# Patient Record
Sex: Female | Born: 1935 | Race: White | Hispanic: No | Marital: Married | Smoking: Former smoker
Health system: Southern US, Community
[De-identification: ages and names within clinical notes are randomized; demographics above are authoritative.]

## PROBLEM LIST (undated history)

## (undated) DIAGNOSIS — H269 Unspecified cataract: Secondary | ICD-10-CM

## (undated) DIAGNOSIS — I495 Sick sinus syndrome: Secondary | ICD-10-CM

## (undated) DIAGNOSIS — K222 Esophageal obstruction: Secondary | ICD-10-CM

## (undated) DIAGNOSIS — G43109 Migraine with aura, not intractable, without status migrainosus: Secondary | ICD-10-CM

## (undated) DIAGNOSIS — I4819 Other persistent atrial fibrillation: Secondary | ICD-10-CM

## (undated) DIAGNOSIS — Z8679 Personal history of other diseases of the circulatory system: Secondary | ICD-10-CM

## (undated) DIAGNOSIS — K449 Diaphragmatic hernia without obstruction or gangrene: Secondary | ICD-10-CM

## (undated) DIAGNOSIS — M19041 Primary osteoarthritis, right hand: Secondary | ICD-10-CM

## (undated) DIAGNOSIS — G5790 Unspecified mononeuropathy of unspecified lower limb: Secondary | ICD-10-CM

## (undated) DIAGNOSIS — J90 Pleural effusion, not elsewhere classified: Secondary | ICD-10-CM

## (undated) DIAGNOSIS — I4892 Unspecified atrial flutter: Secondary | ICD-10-CM

## (undated) DIAGNOSIS — Z87440 Personal history of urinary (tract) infections: Secondary | ICD-10-CM

## (undated) DIAGNOSIS — G4730 Sleep apnea, unspecified: Secondary | ICD-10-CM

## (undated) DIAGNOSIS — T7840XA Allergy, unspecified, initial encounter: Secondary | ICD-10-CM

## (undated) DIAGNOSIS — I10 Essential (primary) hypertension: Secondary | ICD-10-CM

## (undated) DIAGNOSIS — K52831 Collagenous colitis: Secondary | ICD-10-CM

## (undated) DIAGNOSIS — R011 Cardiac murmur, unspecified: Secondary | ICD-10-CM

## (undated) DIAGNOSIS — S86019A Strain of unspecified Achilles tendon, initial encounter: Secondary | ICD-10-CM

## (undated) DIAGNOSIS — G4733 Obstructive sleep apnea (adult) (pediatric): Secondary | ICD-10-CM

## (undated) DIAGNOSIS — I071 Rheumatic tricuspid insufficiency: Secondary | ICD-10-CM

## (undated) DIAGNOSIS — G5600 Carpal tunnel syndrome, unspecified upper limb: Secondary | ICD-10-CM

## (undated) DIAGNOSIS — K635 Polyp of colon: Secondary | ICD-10-CM

## (undated) DIAGNOSIS — G250 Essential tremor: Secondary | ICD-10-CM

## (undated) DIAGNOSIS — K219 Gastro-esophageal reflux disease without esophagitis: Secondary | ICD-10-CM

## (undated) DIAGNOSIS — G629 Polyneuropathy, unspecified: Secondary | ICD-10-CM

## (undated) DIAGNOSIS — K047 Periapical abscess without sinus: Secondary | ICD-10-CM

## (undated) DIAGNOSIS — K5790 Diverticulosis of intestine, part unspecified, without perforation or abscess without bleeding: Secondary | ICD-10-CM

## (undated) DIAGNOSIS — Z9889 Other specified postprocedural states: Secondary | ICD-10-CM

## (undated) HISTORY — PX: CATARACT EXTRACTION: SUR2

## (undated) HISTORY — DX: Rheumatic tricuspid insufficiency: I07.1

## (undated) HISTORY — DX: Other persistent atrial fibrillation: I48.19

## (undated) HISTORY — DX: Collagenous colitis: K52.831

## (undated) HISTORY — DX: Polyneuropathy, unspecified: G62.9

## (undated) HISTORY — DX: Allergy, unspecified, initial encounter: T78.40XA

## (undated) HISTORY — DX: Sleep apnea, unspecified: G47.30

## (undated) HISTORY — DX: Unspecified atrial flutter: I48.92

## (undated) HISTORY — DX: Essential tremor: G25.0

## (undated) HISTORY — PX: INTRAOCULAR LENS IMPLANT, SECONDARY: SHX1842

## (undated) HISTORY — PX: EXCISION MORTON'S NEUROMA: SHX5013

## (undated) HISTORY — DX: Polyp of colon: K63.5

## (undated) HISTORY — DX: Unspecified cataract: H26.9

## (undated) HISTORY — DX: Diverticulosis of intestine, part unspecified, without perforation or abscess without bleeding: K57.90

## (undated) HISTORY — PX: OTHER SURGICAL HISTORY: SHX169

## (undated) HISTORY — PX: EYE SURGERY: SHX253

## (undated) HISTORY — DX: Carpal tunnel syndrome, unspecified upper limb: G56.00

## (undated) HISTORY — DX: Gastro-esophageal reflux disease without esophagitis: K21.9

## (undated) HISTORY — PX: CARDIAC VALVE REPLACEMENT: SHX585

## (undated) HISTORY — DX: Primary osteoarthritis, right hand: M19.041

## (undated) HISTORY — DX: Strain of unspecified achilles tendon, initial encounter: S86.019A

## (undated) HISTORY — DX: Unspecified mononeuropathy of unspecified lower limb: G57.90

## (undated) HISTORY — DX: Obstructive sleep apnea (adult) (pediatric): G47.33

## (undated) HISTORY — DX: Migraine with aura, not intractable, without status migrainosus: G43.109

## (undated) HISTORY — DX: Periapical abscess without sinus: K04.7

## (undated) HISTORY — DX: Essential (primary) hypertension: I10

## (undated) HISTORY — PX: TONSILLECTOMY: SUR1361

## (undated) HISTORY — DX: Diaphragmatic hernia without obstruction or gangrene: K44.9

## (undated) HISTORY — PX: MOUTH SURGERY: SHX715

## (undated) HISTORY — PX: CARDIAC CATHETERIZATION: SHX172

## (undated) HISTORY — PX: CARPAL TUNNEL RELEASE: SHX101

## (undated) HISTORY — DX: Esophageal obstruction: K22.2

## (undated) HISTORY — PX: APPENDECTOMY: SHX54

## (undated) HISTORY — DX: Personal history of urinary (tract) infections: Z87.440

## (undated) HISTORY — DX: Pleural effusion, not elsewhere classified: J90

## (undated) HISTORY — DX: Cardiac murmur, unspecified: R01.1

## (undated) HISTORY — PX: ROTATOR CUFF REPAIR: SHX139

---
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
## 1998-09-10 ENCOUNTER — Other Ambulatory Visit: Admission: RE | Admit: 1998-09-10 | Discharge: 1998-09-10 | Payer: Self-pay | Admitting: Vascular Surgery

## 1999-09-20 ENCOUNTER — Other Ambulatory Visit: Admission: RE | Admit: 1999-09-20 | Discharge: 1999-09-20 | Payer: Self-pay | Admitting: Obstetrics and Gynecology

## 2000-09-05 ENCOUNTER — Other Ambulatory Visit: Admission: RE | Admit: 2000-09-05 | Discharge: 2000-09-05 | Payer: Self-pay | Admitting: Obstetrics and Gynecology

## 2001-05-03 ENCOUNTER — Encounter: Payer: Self-pay | Admitting: Internal Medicine

## 2001-09-06 ENCOUNTER — Other Ambulatory Visit: Admission: RE | Admit: 2001-09-06 | Discharge: 2001-09-06 | Payer: Self-pay | Admitting: Obstetrics and Gynecology

## 2002-09-24 ENCOUNTER — Other Ambulatory Visit: Admission: RE | Admit: 2002-09-24 | Discharge: 2002-09-24 | Payer: Self-pay | Admitting: Obstetrics and Gynecology

## 2003-12-27 DIAGNOSIS — K52831 Collagenous colitis: Secondary | ICD-10-CM

## 2003-12-27 HISTORY — DX: Collagenous colitis: K52.831

## 2004-08-16 ENCOUNTER — Encounter: Payer: Self-pay | Admitting: Internal Medicine

## 2004-10-27 ENCOUNTER — Ambulatory Visit: Payer: Self-pay | Admitting: Internal Medicine

## 2004-11-15 ENCOUNTER — Ambulatory Visit: Payer: Self-pay | Admitting: Family Medicine

## 2005-07-29 ENCOUNTER — Ambulatory Visit: Payer: Self-pay | Admitting: Internal Medicine

## 2005-08-10 ENCOUNTER — Ambulatory Visit: Payer: Self-pay | Admitting: Internal Medicine

## 2005-09-07 ENCOUNTER — Ambulatory Visit: Payer: Self-pay | Admitting: Internal Medicine

## 2005-09-26 ENCOUNTER — Encounter: Payer: Self-pay | Admitting: Internal Medicine

## 2005-09-26 ENCOUNTER — Ambulatory Visit: Payer: Self-pay

## 2005-10-17 ENCOUNTER — Ambulatory Visit: Payer: Self-pay | Admitting: Internal Medicine

## 2006-02-28 ENCOUNTER — Ambulatory Visit: Payer: Self-pay | Admitting: Internal Medicine

## 2006-03-13 ENCOUNTER — Ambulatory Visit: Payer: Self-pay | Admitting: Internal Medicine

## 2006-03-24 ENCOUNTER — Ambulatory Visit: Payer: Self-pay | Admitting: Internal Medicine

## 2006-03-29 ENCOUNTER — Ambulatory Visit: Payer: Self-pay | Admitting: Internal Medicine

## 2006-05-05 ENCOUNTER — Ambulatory Visit: Payer: Self-pay | Admitting: Internal Medicine

## 2006-05-29 ENCOUNTER — Ambulatory Visit: Payer: Self-pay | Admitting: Internal Medicine

## 2006-06-22 ENCOUNTER — Ambulatory Visit: Payer: Self-pay | Admitting: Internal Medicine

## 2006-09-01 ENCOUNTER — Ambulatory Visit: Payer: Self-pay | Admitting: Internal Medicine

## 2006-10-24 ENCOUNTER — Ambulatory Visit: Payer: Self-pay | Admitting: Internal Medicine

## 2006-11-06 ENCOUNTER — Ambulatory Visit: Payer: Self-pay | Admitting: Internal Medicine

## 2006-12-23 ENCOUNTER — Ambulatory Visit: Payer: Self-pay | Admitting: Internal Medicine

## 2007-01-22 ENCOUNTER — Ambulatory Visit: Payer: Self-pay | Admitting: Internal Medicine

## 2007-05-25 ENCOUNTER — Encounter: Payer: Self-pay | Admitting: Internal Medicine

## 2007-07-10 ENCOUNTER — Ambulatory Visit: Payer: Self-pay | Admitting: Internal Medicine

## 2007-10-03 LAB — CONVERTED CEMR LAB: Pap Smear: NORMAL

## 2007-11-16 DIAGNOSIS — G250 Essential tremor: Secondary | ICD-10-CM | POA: Insufficient documentation

## 2007-11-16 DIAGNOSIS — I10 Essential (primary) hypertension: Secondary | ICD-10-CM | POA: Insufficient documentation

## 2007-11-20 ENCOUNTER — Ambulatory Visit: Payer: Self-pay | Admitting: Internal Medicine

## 2007-11-20 LAB — CONVERTED CEMR LAB
Albumin: 4.1 g/dL (ref 3.5–5.2)
Alkaline Phosphatase: 68 units/L (ref 39–117)
BUN: 6 mg/dL (ref 6–23)
Basophils Absolute: 0 10*3/uL (ref 0.0–0.1)
Bilirubin Urine: NEGATIVE
Blood in Urine, dipstick: NEGATIVE
Cholesterol: 201 mg/dL (ref 0–200)
Creatinine, Ser: 0.6 mg/dL (ref 0.4–1.2)
Direct LDL: 126.8 mg/dL
Eosinophils Absolute: 0.1 10*3/uL (ref 0.0–0.6)
GFR calc Af Amer: 127 mL/min
GFR calc non Af Amer: 105 mL/min
Glucose, Urine, Semiquant: NEGATIVE
HCT: 40.9 % (ref 36.0–46.0)
Hemoglobin: 14.3 g/dL (ref 12.0–15.0)
MCHC: 35 g/dL (ref 30.0–36.0)
MCV: 97.5 fL (ref 78.0–100.0)
Monocytes Absolute: 0.4 10*3/uL (ref 0.2–0.7)
Monocytes Relative: 9.5 % (ref 3.0–11.0)
Neutrophils Relative %: 61.5 % (ref 43.0–77.0)
Potassium: 4.6 meq/L (ref 3.5–5.1)
RDW: 11.6 % (ref 11.5–14.6)
Sodium: 135 meq/L (ref 135–145)
Specific Gravity, Urine: 1.015
TSH: 2.82 microintl units/mL (ref 0.35–5.50)
Total Bilirubin: 0.9 mg/dL (ref 0.3–1.2)
Total CHOL/HDL Ratio: 3.2
Triglycerides: 72 mg/dL (ref 0–149)
pH: 7

## 2007-12-04 ENCOUNTER — Ambulatory Visit: Payer: Self-pay | Admitting: Internal Medicine

## 2008-04-03 ENCOUNTER — Encounter: Payer: Self-pay | Admitting: Internal Medicine

## 2008-04-03 ENCOUNTER — Ambulatory Visit: Payer: Self-pay | Admitting: Internal Medicine

## 2008-04-03 DIAGNOSIS — L719 Rosacea, unspecified: Secondary | ICD-10-CM | POA: Insufficient documentation

## 2008-06-26 ENCOUNTER — Telehealth: Payer: Self-pay | Admitting: *Deleted

## 2008-06-26 ENCOUNTER — Telehealth: Payer: Self-pay | Admitting: Internal Medicine

## 2008-10-06 ENCOUNTER — Telehealth: Payer: Self-pay | Admitting: Internal Medicine

## 2008-12-04 ENCOUNTER — Ambulatory Visit: Payer: Self-pay | Admitting: Internal Medicine

## 2008-12-04 LAB — CONVERTED CEMR LAB
Albumin: 4.1 g/dL (ref 3.5–5.2)
BUN: 8 mg/dL (ref 6–23)
Bilirubin Urine: NEGATIVE
Blood in Urine, dipstick: NEGATIVE
Calcium: 9.4 mg/dL (ref 8.4–10.5)
Eosinophils Absolute: 0.1 10*3/uL (ref 0.0–0.7)
Eosinophils Relative: 2.4 % (ref 0.0–5.0)
GFR calc Af Amer: 127 mL/min
GFR calc non Af Amer: 105 mL/min
Glucose, Bld: 99 mg/dL (ref 70–99)
Glucose, Urine, Semiquant: NEGATIVE
HCT: 41.3 % (ref 36.0–46.0)
HDL: 58.6 mg/dL (ref >39.0)
Hemoglobin: 14.4 g/dL (ref 12.0–15.0)
Ketones, urine, test strip: NEGATIVE
MCV: 96.5 fL (ref 78.0–100.0)
Monocytes Absolute: 0.4 10*3/uL (ref 0.1–1.0)
Monocytes Relative: 10.7 % (ref 3.0–12.0)
Neutro Abs: 2.2 10*3/uL (ref 1.4–7.7)
Nitrite: NEGATIVE
RDW: 12 % (ref 11.5–14.6)
Sodium: 134 meq/L — ABNORMAL LOW (ref 135–145)
Specific Gravity, Urine: 1.02
Total CHOL/HDL Ratio: 3.3
Total Protein: 7 g/dL (ref 6.0–8.3)
Triglycerides: 57 mg/dL (ref 0–149)
pH: 6

## 2008-12-05 ENCOUNTER — Ambulatory Visit: Payer: Self-pay | Admitting: Internal Medicine

## 2008-12-26 DIAGNOSIS — K635 Polyp of colon: Secondary | ICD-10-CM

## 2008-12-26 HISTORY — DX: Polyp of colon: K63.5

## 2009-01-06 ENCOUNTER — Telehealth: Payer: Self-pay | Admitting: Internal Medicine

## 2009-03-06 ENCOUNTER — Ambulatory Visit: Payer: Self-pay | Admitting: Internal Medicine

## 2009-06-17 ENCOUNTER — Encounter (INDEPENDENT_AMBULATORY_CARE_PROVIDER_SITE_OTHER): Payer: Self-pay | Admitting: *Deleted

## 2009-06-19 ENCOUNTER — Ambulatory Visit: Payer: Self-pay | Admitting: Internal Medicine

## 2009-06-19 DIAGNOSIS — K222 Esophageal obstruction: Secondary | ICD-10-CM

## 2009-06-19 DIAGNOSIS — K219 Gastro-esophageal reflux disease without esophagitis: Secondary | ICD-10-CM | POA: Insufficient documentation

## 2009-07-03 ENCOUNTER — Telehealth: Payer: Self-pay | Admitting: Internal Medicine

## 2009-07-03 ENCOUNTER — Ambulatory Visit: Payer: Self-pay | Admitting: Internal Medicine

## 2009-07-03 DIAGNOSIS — K625 Hemorrhage of anus and rectum: Secondary | ICD-10-CM | POA: Insufficient documentation

## 2009-07-06 ENCOUNTER — Telehealth: Payer: Self-pay | Admitting: Nurse Practitioner

## 2009-07-06 ENCOUNTER — Encounter: Payer: Self-pay | Admitting: Nurse Practitioner

## 2009-07-13 ENCOUNTER — Telehealth: Payer: Self-pay | Admitting: Nurse Practitioner

## 2009-07-16 ENCOUNTER — Ambulatory Visit: Payer: Self-pay | Admitting: Internal Medicine

## 2009-07-24 ENCOUNTER — Ambulatory Visit: Payer: Self-pay | Admitting: Internal Medicine

## 2009-07-24 LAB — CONVERTED CEMR LAB
ALT: 19 units/L (ref 0–35)
AST: 22 units/L (ref 0–37)
Alkaline Phosphatase: 49 units/L (ref 39–117)
Bilirubin, Direct: 0.1 mg/dL (ref 0.0–0.3)
Cholesterol: 191 mg/dL (ref 0–200)
Total Protein: 6.8 g/dL (ref 6.0–8.3)

## 2009-08-06 ENCOUNTER — Ambulatory Visit: Payer: Self-pay | Admitting: Internal Medicine

## 2009-08-19 ENCOUNTER — Encounter: Payer: Self-pay | Admitting: Internal Medicine

## 2009-09-15 ENCOUNTER — Ambulatory Visit: Payer: Self-pay | Admitting: Internal Medicine

## 2009-09-24 LAB — CONVERTED CEMR LAB: Pap Smear: NORMAL

## 2009-09-29 ENCOUNTER — Ambulatory Visit: Payer: Self-pay | Admitting: Internal Medicine

## 2009-09-29 ENCOUNTER — Encounter: Payer: Self-pay | Admitting: Internal Medicine

## 2009-09-30 ENCOUNTER — Encounter: Payer: Self-pay | Admitting: Internal Medicine

## 2009-10-29 ENCOUNTER — Telehealth: Payer: Self-pay | Admitting: Internal Medicine

## 2009-11-02 ENCOUNTER — Ambulatory Visit: Payer: Self-pay | Admitting: Internal Medicine

## 2009-11-02 LAB — CONVERTED CEMR LAB

## 2009-12-04 ENCOUNTER — Telehealth: Payer: Self-pay | Admitting: Internal Medicine

## 2009-12-04 ENCOUNTER — Ambulatory Visit: Payer: Self-pay | Admitting: Internal Medicine

## 2009-12-04 ENCOUNTER — Ambulatory Visit: Payer: Self-pay | Admitting: Physician Assistant

## 2009-12-09 LAB — CONVERTED CEMR LAB
Basophils Relative: 0 % (ref 0.0–3.0)
Eosinophils Relative: 0.9 % (ref 0.0–5.0)
Lymphocytes Relative: 9.3 % — ABNORMAL LOW (ref 12.0–46.0)
Monocytes Relative: 4.3 % (ref 3.0–12.0)
Neutrophils Relative %: 85.5 % — ABNORMAL HIGH (ref 43.0–77.0)
Platelets: 214 10*3/uL (ref 150.0–400.0)
RBC: 4.29 M/uL (ref 3.87–5.11)
WBC: 11.3 10*3/uL — ABNORMAL HIGH (ref 4.5–10.5)

## 2009-12-11 ENCOUNTER — Encounter: Payer: Self-pay | Admitting: Physician Assistant

## 2010-01-07 ENCOUNTER — Ambulatory Visit: Payer: Self-pay | Admitting: Internal Medicine

## 2010-01-07 DIAGNOSIS — E785 Hyperlipidemia, unspecified: Secondary | ICD-10-CM | POA: Insufficient documentation

## 2010-01-07 LAB — CONVERTED CEMR LAB
Calcium: 9.3 mg/dL (ref 8.4–10.5)
Cholesterol: 206 mg/dL — ABNORMAL HIGH (ref 0–200)
Creatinine, Ser: 0.7 mg/dL (ref 0.4–1.2)
GFR calc non Af Amer: 87.31 mL/min (ref >60)
HDL: 69 mg/dL (ref >39.00)
Sodium: 126 meq/L — ABNORMAL LOW (ref 135–145)
TSH: 1.65 microintl units/mL (ref 0.35–5.50)

## 2010-02-08 ENCOUNTER — Ambulatory Visit: Payer: Self-pay | Admitting: Internal Medicine

## 2010-02-08 LAB — CONVERTED CEMR LAB
Albumin: 4.2 g/dL
BUN: 14 mg/dL
CO2: 31 meq/L
Calcium: 9.7 mg/dL
Chloride: 96 meq/L
Creatinine, Ser: 0.7 mg/dL
GFR calc non Af Amer: 87.29 mL/min
Glucose, Bld: 95 mg/dL
Phosphorus: 5.4 mg/dL — ABNORMAL HIGH
Potassium: 4 meq/L
Sodium, Ur: 49 meq/L
Sodium: 134 meq/L — ABNORMAL LOW

## 2010-03-12 ENCOUNTER — Ambulatory Visit: Payer: Self-pay | Admitting: Internal Medicine

## 2010-03-12 LAB — CONVERTED CEMR LAB
CO2: 32 meq/L (ref 19–32)
Calcium: 9.1 mg/dL (ref 8.4–10.5)
Creatinine, Ser: 0.8 mg/dL (ref 0.4–1.2)
GFR calc non Af Amer: 74.8 mL/min (ref >60)
Glucose, Bld: 76 mg/dL (ref 70–99)
Sodium: 132 meq/L — ABNORMAL LOW (ref 135–145)

## 2010-03-16 ENCOUNTER — Ambulatory Visit: Payer: Self-pay | Admitting: Internal Medicine

## 2010-03-17 ENCOUNTER — Ambulatory Visit: Payer: Self-pay | Admitting: Internal Medicine

## 2010-03-23 ENCOUNTER — Encounter: Payer: Self-pay | Admitting: Internal Medicine

## 2010-03-23 LAB — CONVERTED CEMR LAB: Normetanephrine, 24H Ur: 316 (ref 122–676)

## 2010-04-20 ENCOUNTER — Ambulatory Visit: Payer: Self-pay | Admitting: Internal Medicine

## 2010-07-14 ENCOUNTER — Ambulatory Visit: Payer: Self-pay | Admitting: Internal Medicine

## 2010-07-14 LAB — CONVERTED CEMR LAB
BUN: 13 mg/dL (ref 6–23)
Chloride: 87 meq/L — ABNORMAL LOW (ref 96–112)
Creatinine, Ser: 0.6 mg/dL (ref 0.4–1.2)
Glucose, Bld: 91 mg/dL (ref 70–99)
Potassium: 4 meq/L (ref 3.5–5.1)

## 2010-08-03 ENCOUNTER — Ambulatory Visit: Payer: Self-pay | Admitting: Internal Medicine

## 2010-08-20 ENCOUNTER — Ambulatory Visit: Payer: Self-pay | Admitting: Family Medicine

## 2010-08-25 ENCOUNTER — Ambulatory Visit: Payer: Self-pay | Admitting: Internal Medicine

## 2010-08-25 ENCOUNTER — Telehealth: Payer: Self-pay | Admitting: Internal Medicine

## 2010-08-26 ENCOUNTER — Encounter: Admission: RE | Admit: 2010-08-26 | Discharge: 2010-08-26 | Payer: Self-pay | Admitting: Internal Medicine

## 2010-08-27 ENCOUNTER — Encounter: Payer: Self-pay | Admitting: Internal Medicine

## 2010-09-14 ENCOUNTER — Ambulatory Visit: Payer: Self-pay | Admitting: Internal Medicine

## 2010-09-14 LAB — CONVERTED CEMR LAB
BUN: 15 mg/dL (ref 6–23)
CO2: 30 meq/L (ref 19–32)
Calcium: 9.8 mg/dL (ref 8.4–10.5)
Creatinine, Ser: 0.6 mg/dL (ref 0.4–1.2)
GFR calc non Af Amer: 98.41 mL/min (ref >60)
Glucose, Bld: 84 mg/dL (ref 70–99)

## 2010-09-21 ENCOUNTER — Telehealth: Payer: Self-pay | Admitting: Internal Medicine

## 2010-10-08 ENCOUNTER — Ambulatory Visit: Payer: Self-pay | Admitting: Internal Medicine

## 2010-11-22 ENCOUNTER — Ambulatory Visit: Payer: Self-pay | Admitting: Internal Medicine

## 2010-11-22 LAB — CONVERTED CEMR LAB
BUN: 11 mg/dL (ref 6–23)
Calcium: 9.5 mg/dL (ref 8.4–10.5)
Chloride: 89 meq/L — ABNORMAL LOW (ref 96–112)
Creatinine, Ser: 0.7 mg/dL (ref 0.4–1.2)
GFR calc non Af Amer: 90.06 mL/min (ref >60)

## 2010-12-01 ENCOUNTER — Encounter
Admission: RE | Admit: 2010-12-01 | Discharge: 2010-12-01 | Payer: Self-pay | Source: Home / Self Care | Admitting: Obstetrics and Gynecology

## 2010-12-06 ENCOUNTER — Encounter: Payer: Self-pay | Admitting: Internal Medicine

## 2010-12-08 ENCOUNTER — Encounter: Payer: Self-pay | Admitting: Internal Medicine

## 2010-12-24 LAB — CONVERTED CEMR LAB: Pap Smear: NORMAL

## 2011-01-17 ENCOUNTER — Ambulatory Visit
Admission: RE | Admit: 2011-01-17 | Discharge: 2011-01-17 | Payer: Self-pay | Source: Home / Self Care | Attending: Internal Medicine | Admitting: Internal Medicine

## 2011-01-17 ENCOUNTER — Encounter: Payer: Self-pay | Admitting: Internal Medicine

## 2011-01-17 ENCOUNTER — Other Ambulatory Visit: Payer: Self-pay | Admitting: Internal Medicine

## 2011-01-17 DIAGNOSIS — S86019A Strain of unspecified Achilles tendon, initial encounter: Secondary | ICD-10-CM | POA: Insufficient documentation

## 2011-01-17 LAB — CBC WITH DIFFERENTIAL/PLATELET
Basophils Absolute: 0 10*3/uL (ref 0.0–0.1)
Hemoglobin: 13.8 g/dL (ref 12.0–15.0)
Lymphocytes Relative: 20.3 % (ref 12.0–46.0)
Monocytes Relative: 10.1 % (ref 3.0–12.0)
Neutrophils Relative %: 67.5 % (ref 43.0–77.0)
Platelets: 177 10*3/uL (ref 150.0–400.0)
RDW: 12.8 % (ref 11.5–14.6)

## 2011-01-17 LAB — CONVERTED CEMR LAB
Bilirubin Urine: NEGATIVE
Cholesterol, target level: 200 mg/dL
Glucose, Urine, Semiquant: NEGATIVE
HDL goal, serum: 40 mg/dL
Ketones, urine, test strip: NEGATIVE
LDL Goal: 160 mg/dL
Specific Gravity, Urine: 1.01
Urobilinogen, UA: 0.2

## 2011-01-17 LAB — COMPREHENSIVE METABOLIC PANEL
AST: 17 U/L (ref 0–37)
Alkaline Phosphatase: 55 U/L (ref 39–117)
BUN: 12 mg/dL (ref 6–23)
Creatinine, Ser: 0.5 mg/dL (ref 0.4–1.2)
Glucose, Bld: 87 mg/dL (ref 70–99)

## 2011-01-17 LAB — LIPID PANEL
Cholesterol: 203 mg/dL — ABNORMAL HIGH (ref 0–200)
HDL: 64.9 mg/dL (ref >39.00)
Triglycerides: 71 mg/dL (ref 0.0–149.0)
VLDL: 14.2 mg/dL (ref 0.0–40.0)

## 2011-01-17 LAB — LDL CHOLESTEROL, DIRECT: Direct LDL: 128.8 mg/dL

## 2011-01-25 NOTE — Assessment & Plan Note (Signed)
Summary: 4 month rov/njr   Vital Signs:  Patient profile:   76 year old female Height:      66 inches Weight:      156 pounds BMI:     25.27 Temp:     98.1 degrees F oral Pulse rate:   68 / minute Resp:     14 per minute BP sitting:   136 / 80  (left arm)  Vitals Entered By: Willy Eddy, LPN (July 14, 2010 8:18 AM) CC: roa, Hypertension Management Is Patient Diabetic? No   Primary Care Provider:  Darryll Capers, MD   CC:  roa and Hypertension Management.  History of Present Illness: Has not taken the GERD has elevated head of bed and underwent successfull salivary dilation ( see note) did not want to take a PPI due to risk of ostoeporosis fell and had a vitrious detachment seeing the opthamologist  need follow up of the sodium ( had low) mild balance issues since had eye surgery  Hypertension History:      She denies headache, chest pain, palpitations, dyspnea with exertion, orthopnea, PND, peripheral edema, visual symptoms, neurologic problems, syncope, and side effects from treatment.        Positive major cardiovascular risk factors include female age 74 years old or older, hyperlipidemia, and hypertension.  Negative major cardiovascular risk factors include negative family history for ischemic heart disease and non-tobacco-user status.     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) 10)  Adverse Drug Reaction   (ICD-995.20) 11)  Family Hx Colon Cancer  (ICD-V16.0) 12)  Rectal Bleeding  (ICD-569.3) 13)  Diarrhea-presumed Infectious  (ICD-009.3) 14)  Gastroesophageal Reflux Disease  (ICD-530.81) 15)  Insect Bite, Upper Arm  (ICD-912.4) 16)  Biceps Tendinitis, Right  (ICD-726.12) 17)  Acne Rosacea  (ICD-695.3) 18)  Physical Examination  (ICD-V70.0) 19)  Collagenous Colitis  (ICD-710.9) 20)  Diverticulosis, Colon  (ICD-562.10) 21)  Retinal Detachment, 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) 22)  Morton's Neuroma, 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) 23)  Hypertension  (ICD-401.9) 24)  Tremor, Essential  (ICD-333.1)  Current Problems (verified): 1)  Dysphagia  (ICD-787.29) 2)  Fh of Colon Cancer  (ICD-153.9) 3)  Hyponatremia  (ICD-276.1) 4)  Hyperlipidemia, Mild  (ICD-272.4) 5)  Preventive Health Care  (ICD-V70.0) 6)  Colonic Polyps, Adenomatous, Hx of  (ICD-V12.72) 7)  Diarrhea  (ICD-787.91) 8)  Abdominal Pain -generalized  (ICD-789.07) 9)  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 One By Mouth Daily 2)  Turmeric Curcumin   Caps (Misc Natural Products) .... Once Daily 3)  Vitamin D-1000 Max St [phone] Mg-Unit  Tabs (Calcium-Vitamin D) .... Once Daily 4)  Tums 500 Mg  Chew (Calcium Carbonate Antacid) .... Three  Times A Day 5)  Restasis 0.05 % Emul (Cyclosporine) .... Use As Directed 6)  Fish Oil 1000 Mg Caps (Omega-3 Fatty Acids) .... One By Mouth Two Times A Day 7)  Omeprazole 20 Mg  Cpdr (Omeprazole) .Marland Kitchen.. 1 Each Day 30 Minutes Before Meal 8)  Omeprazole 20 Mg  Cpdr (Omeprazole) .Marland Kitchen.. 1 Each Day 30 Minutes Before Meal  Current Medications (verified): 1)  Nadolol-Bendroflumethiazide 40-5 Mg  Tabs (Nadolol-Bendroflumethiazide) .... One By Mouth Daily 2)  Vitamin D-1000 Max St [phone] Mg-Unit  Tabs (Calcium-Vitamin D) .... Once Daily 3)  Tums 500 Mg  Chew (Calcium Carbonate Antacid) .... Three Times A Day 4)  Restasis 0.05 % Emul (Cyclosporine) .... Once Daily 5)  Fish Oil 1000 Mg Caps (Omega-3 Fatty Acids) .... Pt States 10,000 Once Daily 6)  Ocuvite  Tabs (Multiple Vitamins-Minerals) .Marland Kitchen.. 1 Two Times A Day  Allergies (verified): 1)  ! Penicillin G Procaine (Penicillin G Procaine) 2)  ! Ery-Tab (Erythromycin Base) 3)  ! Adhesive Paper (Adhesive Tape) 4)  ! Biaxin 5)  ! Flagyl (Metronidazole)  Directives: 1)  Living Will   Past History:  Family History: Last updated: 2010-03-22 father died from rectal cancer and heart failure mother is 93 with dementia Family History of Celiac Disease: Daughter Family History of Heart Disease: Father Son has Barrett's esophagus  Social History: Last updated: March 22, 2010 Married Former Smoker Alcohol Use - yes-1 glass wine daily Daily Caffeine Use Illicit Drug Use - no  Risk Factors: Alcohol Use: <1 (07/14/2010) Caffeine Use: 1 (12/04/2007) Exercise: no (12/04/2007)  Risk Factors: Smoking Status: quit (07/14/2010) Packs/Day: 1-2 packs (07/14/2010) Passive Smoke Exposure: no (07/14/2010)  Past medical, surgical, family and social histories (including risk factors) reviewed, and no changes noted (except as noted below).  Past Medical History: Reviewed history from 12/04/2009 and no changes required. Hypertension Diverticulosis COLON  POLYPS 10/10,TUBULAR ADENOMA AND ONE HYPERPLASTIC Collagenous colitis 2005 Hypertension  Past Surgical History: Reviewed history from Mar 22, 2010 and no changes required. Appendectomy Tonsillectomy Cataract extraction Moles removed Surgerical Removal of Morton's Neuroma in Right Foot Mouth surgery (for exostosis)  Family History: Reviewed history from 03/22/10 and no changes required. father died from rectal cancer and heart failure mother is 29 with dementia Family History of Celiac Disease: Daughter Family History of Heart Disease: Father Son has Barrett's esophagus  Social History: Reviewed history from 03-22-10 and no changes required. Married Former Smoker Alcohol Use - yes-1 glass wine daily Daily Caffeine Use Illicit Drug Use - no  Review of Systems  The patient denies anorexia, fever, weight loss, weight gain, vision loss, decreased hearing, hoarseness, chest pain, syncope, dyspnea on exertion, peripheral edema, prolonged cough, headaches, hemoptysis, abdominal pain, melena, hematochezia, severe indigestion/heartburn, hematuria, incontinence, genital sores, muscle weakness, suspicious skin lesions, transient blindness, difficulty walking, depression, unusual weight change, abnormal bleeding, enlarged lymph nodes, angioedema, and breast masses.         no symptomatic heart burn  Physical Exam  General:  Well-developed,well-nourished,in no acute distress; alert,appropriate and cooperative  throughout examination Head:  Normocephalic and atraumatic without obvious abnormalities. No apparent alopecia or balding. Eyes:  pupils equal and pupils round.   Ears:  R ear normal and L ear normal.   Nose:  no external deformity and no nasal discharge.   Mouth:  pharynx pink and moist and no erythema.   Neck:  No deformities, masses, or tenderness noted. Lungs:  normal respiratory effort and no intercostal retractions.   Heart:  normal rate and regular rhythm.   Abdomen:   soft, non-tender, and normal bowel sounds.   Msk:  normal ROM, no joint tenderness, and no joint swelling.   Pulses:  R and L carotid,radial,femoral,dorsalis pedis and posterior tibial pulses are full and equal bilaterally Extremities:  No clubbing, cyanosis, edema, or deformity noted with normal full range of motion of all joints.   Neurologic:  alert & oriented X3 and gait normal.     Impression & Recommendations:  Problem # 1:  DYSPHAGIA (ZOX-096.04) did not want to take a PPI has made extensive lifestyle modifications  Problem # 2:  HYPERLIPIDEMIA, MILD (ICD-272.4)  Labs Reviewed: SGOT: 22 (07/24/2009)   SGPT: 19 (07/24/2009)  10 Yr Risk Heart Disease: 7 % Prior 10 Yr Risk Heart Disease: 4 % (03/17/2010)   HDL:69.00 (01/07/2010), 54.50 (07/24/2009)  LDL:115 (07/24/2009), 121 (12/04/2008)  Chol:206 (01/07/2010), 191 (07/24/2009)  Trig:106.0 (07/24/2009), 57 (12/04/2008)  Problem # 3:  HYPERTENSION (ICD-401.9) Assessment: Improved  Her updated medication list for this problem includes:    Nadolol-bendroflumethiazide 40-5 Mg Tabs (Nadolol-bendroflumethiazide) ..... One by mouth daily  BP today: 136/80 Prior BP: 110/74 (03/17/2010)  10 Yr Risk Heart Disease: 7 % Prior 10 Yr Risk Heart Disease: 4 % (03/17/2010)  Labs Reviewed: K+: 3.8 (03/12/2010) Creat: : 0.8 (03/12/2010)   Chol: 206 (01/07/2010)   HDL: 69.00 (01/07/2010)   LDL: 115 (07/24/2009)   TG: 106.0 (07/24/2009)  Problem # 4:  TREMOR, ESSENTIAL (ICD-333.1) Assessment: Unchanged tremor stable  Problem # 5:  HYPONATREMIA (ICD-276.1)  moniter labs  Orders: Venipuncture (54098) TLB-BMP (Basic Metabolic Panel-BMET) (80048-METABOL)  Complete Medication List: 1)  Nadolol-bendroflumethiazide 40-5 Mg Tabs (Nadolol-bendroflumethiazide) .... One by mouth daily 2)  Vitamin D-1000 Max St [id number] Mg-unit Tabs (Calcium-vitamin d) .... Once daily 3)  Tums 500 Mg Chew (Calcium carbonate antacid) .... Three times a day 4)   Restasis 0.05 % Emul (Cyclosporine) .... Once daily 5)  Fish Oil 1000 Mg Caps (Omega-3 fatty acids) .... Pt states 10,000 once daily 6)  Ocuvite Tabs (Multiple vitamins-minerals) .Marland Kitchen.. 1 two times a day  Hypertension Assessment/Plan:      The patient's hypertensive risk group is category B: At least one risk factor (excluding diabetes) with no target organ damage.  Her calculated 10 year risk of coronary heart disease is 7 %.  Today's blood pressure is 136/80.  Her blood pressure goal is < 140/90.  Patient Instructions: 1)  Jan   medicare prevention visit  30 min ( fasting)

## 2011-01-25 NOTE — Assessment & Plan Note (Signed)
Summary: 1 month rov/njr   Vital Signs:  Patient profile:   76 year old female Height:      66 inches Weight:      162 pounds BMI:     26.24 Temp:     98.2 degrees F oral Pulse rate:   68 / minute Resp:     14 per minute BP sitting:   110 / 74  (left arm)  Vitals Entered By: Willy Eddy, LPN (March 17, 2010 12:47 PM) CC: roa labs    Primary Care Provider:  Darryll Capers, MD   CC:  roa labs .  History of Present Illness: Has an endoscopy with Dr Juanda Chance The etiology of the diarrhea may be celiac dz and bz will be pursued   Hypertension History:      She denies headache, chest pain, palpitations, dyspnea with exertion, orthopnea, PND, peripheral edema, visual symptoms, neurologic problems, syncope, and side effects from treatment.        Positive major cardiovascular risk factors include female age 27 years old or older, hyperlipidemia, and hypertension.  Negative major cardiovascular risk factors include negative family history for ischemic heart disease and non-tobacco-user status.     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) 10)  Adverse Drug Reaction  (ICD-995.20) 11)  Family Hx Colon Cancer  (ICD-V16.0) 12)  Rectal Bleeding  (ICD-569.3) 13)  Diarrhea-presumed Infectious  (ICD-009.3) 14)  Gastroesophageal Reflux Disease  (ICD-530.81) 15)  Insect Bite, Upper Arm  (ICD-912.4) 16)  Biceps Tendinitis, Right   (ICD-726.12) 17)  Acne Rosacea  (ICD-695.3) 18)  Physical Examination  (ICD-V70.0) 19)  Collagenous Colitis  (ICD-710.9) 20)  Diverticulosis, Colon  (ICD-562.10) 21)  Retinal Detachment, 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) 22)  Morton's Neuroma, 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 One By Mouth Daily 2)  Turmeric Curcumin   Caps (Misc Natural Products) .... Once Daily 3)  Vitamin D-1000 Max St [phone] Mg-Unit  Tabs (Calcium-Vitamin D) .... Once Daily 4)  Tums 500 Mg  Chew (Calcium Carbonate Antacid) .... Three Times A Day 5)  Restasis 0.05 % Emul (Cyclosporine) .... Use As Directed 6)  Fish Oil 1000 Mg Caps (Omega-3 Fatty Acids) .... One By Mouth Two Times A Day  Current Medications (verified): 1)  Nadolol-Bendroflumethiazide 40-5 Mg  Tabs (Nadolol-Bendroflumethiazide) .... One By Mouth Daily 2)  Turmeric Curcumin   Caps (Misc Natural Products) .... Once Daily 3)  Vitamin D-1000 Max St [id number] Mg-Unit  Tabs (Calcium-Vitamin D) .... Once Daily 4)  Tums 500 Mg  Chew (Calcium Carbonate Antacid) .... Three Times A Day 5)  Restasis 0.05 % Emul (Cyclosporine) .... Use As Directed 6)  Fish Oil 1000 Mg Caps (Omega-3 Fatty Acids) .... One By Mouth Two Times A  Day  Allergies (verified): 1)  ! Penicillin G Procaine (Penicillin G Procaine) 2)  ! Ery-Tab (Erythromycin Base) 3)  ! Adhesive Paper (Adhesive Tape) 4)  ! Biaxin 5)  ! Flagyl (Metronidazole)  Directives: 1)  Living Will   Past History:  Family History: Last updated: 04-04-2010 father died from rectal cancer and heart failure mother is 45 with dementia Family History of Celiac Disease: Daughter Family History of Heart Disease: Father Son has Barrett's esophagus  Social History: Last updated: 04-04-2010 Married Former Smoker Alcohol Use - yes-1 glass wine daily Daily Caffeine  Use Illicit Drug Use - no  Risk Factors: Alcohol Use: <1 (03/17/2010) Caffeine Use: 1 (12/04/2007) Exercise: no (12/04/2007)  Risk Factors: Smoking Status: quit (03/17/2010) Packs/Day: 1-2 packs (03/17/2010) Passive Smoke Exposure: no (03/17/2010)  Past medical, surgical, family and social histories (including risk factors) reviewed, and no changes noted (except as noted below).  Past Medical History: Reviewed history from 12/04/2009 and no changes required. Hypertension Diverticulosis COLON POLYPS 10/10,TUBULAR ADENOMA AND ONE HYPERPLASTIC Collagenous colitis 2005 Hypertension  Past Surgical History: Reviewed history from 04-04-10 and no changes required. Appendectomy Tonsillectomy Cataract extraction Moles removed Surgerical Removal of Morton's Neuroma in Right Foot Mouth surgery (for exostosis)  Family History: Reviewed history from 04/04/10 and no changes required. father died from rectal cancer and heart failure mother is 41 with dementia Family History of Celiac Disease: Daughter Family History of Heart Disease: Father Son has Barrett's esophagus  Social History: Reviewed history from April 04, 2010 and no changes required. Married Former Smoker Alcohol Use - yes-1 glass wine daily Daily Caffeine Use Illicit Drug Use - no  Review of Systems  The patient denies anorexia, fever, weight loss, weight gain, vision loss, decreased hearing, hoarseness, chest pain, syncope, dyspnea on exertion, peripheral edema, prolonged cough, headaches, hemoptysis, abdominal pain, melena, hematochezia, severe indigestion/heartburn, hematuria, incontinence, genital sores, muscle weakness, suspicious skin lesions, transient blindness, difficulty walking, depression, unusual weight change, abnormal bleeding, enlarged lymph nodes, angioedema, and breast masses.    Physical Exam  General:  Well-developed,well-nourished,in no acute distress; alert,appropriate and cooperative  throughout examination Head:  Normocephalic and atraumatic without obvious abnormalities. No apparent alopecia or balding. Eyes:  pupils equal and pupils round.   Ears:  R ear normal and L ear normal.   Nose:  no external deformity and no nasal discharge.   Neck:  No deformities, masses, or tenderness noted. Lungs:  normal respiratory effort and no intercostal retractions.   Heart:  normal rate and regular rhythm.   Abdomen:  Bowel sounds positive,abdomen soft and non-tender without masses, organomegaly or hernias noted.   Impression & Recommendations:  Problem # 1:  HYPONATREMIA (ICD-276.1) Assessment Unchanged  the  sodium has been borderline but is stable given the diarrhea the differential still includes malabsorption and carcinoid? will do a 24 hour urine collection  Orders: T-Urine 24 Hr. 5 HIAA ([id number]) T-Urine 24 Hr. Catecholamines [id number]) T-Urine 24 Hr. Metanephrines [phone])  Problem # 2:  HYPERLIPIDEMIA, MILD (ICD-272.4) Assessment: Unchanged  Labs Reviewed: SGOT: 22 (07/24/2009)   SGPT: 19 (07/24/2009)  10 Yr Risk Heart Disease: 4 % Prior 10 Yr Risk Heart Disease: 9 % (11/02/2009)   HDL:69.00 (01/07/2010), 54.50 (07/24/2009)  LDL:115 (07/24/2009), 121 (12/04/2008)  Chol:206 (01/07/2010), 191 (07/24/2009)  Trig:106.0 (07/24/2009), 57 (12/04/2008)  Problem # 3:  DYSPHAGIA (ICD-787.29) EGD planned  Complete Medication List: 1)  Nadolol-bendroflumethiazide 40-5 Mg Tabs (Nadolol-bendroflumethiazide) .... One by mouth daily 2)  Turmeric Curcumin Caps (Misc natural products) .... Once daily  3)  Vitamin D-1000 Max St [phone] Mg-unit Tabs (Calcium-vitamin d) .... Once daily 4)  Tums 500 Mg Chew (Calcium carbonate antacid) .... Three times a day 5)  Restasis 0.05 % Emul (Cyclosporine) .... Use as directed 6)  Fish Oil 1000 Mg Caps (Omega-3 fatty acids) .... One by mouth two times a day  Hypertension Assessment/Plan:      The patient's hypertensive  risk group is category B: At least one risk factor (excluding diabetes) with no target organ damage.  Her calculated 10 year risk of coronary heart disease is 4 %.  Today's blood pressure is 110/74.  Her blood pressure goal is < 140/90.  Patient Instructions: 1)  stop the tumeric possible impact on sodium 2)  consider  cinamin for the arthritis 3)  Please schedule a follow-up appointment in 4 months.

## 2011-01-25 NOTE — Assessment & Plan Note (Signed)
Summary: heel pain/bmw   Vital Signs:  Patient profile:   76 year old female Height:      66 inches Weight:      156 pounds BMI:     25.27 Temp:     99.2 degrees F oral Pulse rate:   72 / minute Resp:     14 per minute BP sitting:   130 / 80  (left arm)  Vitals Entered By: Willy Eddy, LPN (August 25, 2010 4:14 PM) CC: saw dr Caryl Never friday for HEEL PAIN IN LEFT FOOT- COMPLETED Z PACK ,BUT STILL PAINFUL AND RED AND SWOLLEN Is Patient Diabetic? No   Primary Care Provider:  Darryll Capers, MD   CC:  saw dr Caryl Never friday for HEEL PAIN IN LEFT FOOT- COMPLETED Z PACK  and BUT STILL PAINFUL AND RED AND SWOLLEN.  History of Present Illness: june 9 was seen for alkle pain wrapping the site--- could not tolerate the pt was seen again in the office for possible tendonitis vs cellulitis the examiner though the tnedon was warm to touch she has no "entry point for cellulitis and has no increased risk factors this has continued for 2 months after an injury suggesting a tendon tear hx of ankle fracture in 1999  Preventive Screening-Counseling & Management  Alcohol-Tobacco     Alcohol drinks/day: <1     Smoking Status: quit     Packs/Day: 1-2 packs     Year Started: 1959     Year Quit: 1969     Passive Smoke Exposure: no  Problems Prior to Update: 1)  Heel Pain, Left  (ICD-729.5) 2)  Dysphagia  (ICD-787.29) 3)  Fh of Colon Cancer  (ICD-153.9) 4)  Hyponatremia  (ICD-276.1) 5)  Hyperlipidemia, Mild  (ICD-272.4) 6)  Preventive Health Care  (ICD-V70.0) 7)  Colonic Polyps, Adenomatous, Hx of  (ICD-V12.72) 8)  Diarrhea  (ICD-787.91) 9)  Abdominal Pain -generalized  (ICD-789.07) 10)  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) 11)  Adverse Drug Reaction  (ICD-995.20) 12)  Family Hx Colon Cancer  (ICD-V16.0) 13)  Rectal Bleeding  (ICD-569.3) 14)  Diarrhea-presumed Infectious  (ICD-009.3) 15)  Gastroesophageal Reflux Disease  (ICD-530.81) 16)  Insect Bite, Upper Arm   (ICD-912.4) 17)  Biceps Tendinitis, Right  (ICD-726.12) 18)  Acne Rosacea  (ICD-695.3) 19)  Physical Examination  (ICD-V70.0) 20)  Collagenous Colitis  (ICD-710.9) 21)  Diverticulosis, Colon  (ICD-562.10) 22)  Retinal Detachment, 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) 23)  Morton's Neuroma, 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) 24)  Hypertension  (ICD-401.9) 25)  Tremor, Essential  (ICD-333.1)  Current Problems (verified): 1)  Heel Pain, Left  (ICD-729.5) 2)  Dysphagia  (ICD-787.29) 3)  Fh of Colon Cancer  (ICD-153.9) 4)  Hyponatremia  (ICD-276.1) 5)  Hyperlipidemia, Mild  (ICD-272.4) 6)  Preventive Health Care  (ICD-V70.0) 7)  Colonic Polyps, Adenomatous, Hx of  (ICD-V12.72) 8)  Diarrhea  (ICD-787.91) 9)  Abdominal Pain -generalized  (ICD-789.07) 10)  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) 11)  Adverse Drug Reaction  (ICD-995.20) 12)  Family Hx Colon Cancer  (ICD-V16.0) 13)  Rectal Bleeding  (ICD-569.3) 14)  Diarrhea-presumed Infectious  (ICD-009.3) 15)  Gastroesophageal Reflux Disease  (ICD-530.81) 16)  Insect Bite, Upper Arm  (ICD-912.4) 17)  Biceps Tendinitis, Right  (ICD-726.12) 18)  Acne Rosacea  (ICD-695.3) 19)  Physical Examination  (ICD-V70.0) 20)  Collagenous Colitis  (ICD-710.9) 21)  Diverticulosis, Colon  (ICD-562.10) 22)  Retinal Detachment, 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) 23)  Morton's Neuroma, 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 One By Mouth Daily 2)  Vitamin D-1000 Max St [phone] Mg-Unit  Tabs (Calcium-Vitamin D) .... Once Daily 3)  Tums 500 Mg  Chew (Calcium Carbonate Antacid) .... Three Times A Day 4)  Fish Oil 1000 Mg Caps (Omega-3 Fatty Acids) .... Pt States 10,000 Once Daily 5)  Ocuvite  Tabs (Multiple Vitamins-Minerals) .... 2 Once Daily 6)  Azithromycin 250 Mg Tabs (Azithromycin) .... 2 By Mouth Today Then One By Mouth Once Daily  For 4 Days  Current Medications (verified): 1)  Nadolol-Bendroflumethiazide 40-5 Mg  Tabs (Nadolol-Bendroflumethiazide) .... One By Mouth Daily 2)  Vitamin D-1000 Max St [phone] Mg-Unit  Tabs (Calcium-Vitamin D) .... Once Daily 3)  Tums 500 Mg  Chew (Calcium Carbonate Antacid) .... Three Times A Day 4)  Fish Oil 1000 Mg Caps (Omega-3 Fatty Acids) .... Pt States 10,000 Once Daily 5)  Ocuvite  Tabs (Multiple Vitamins-Minerals) .... 2 Once Daily 6)  Azithromycin 250 Mg Tabs (Azithromycin) .... 2 By Mouth Today Then One By Mouth Once Daily For 4 Days  Allergies (verified): 1)  ! Penicillin G Procaine (Penicillin G Procaine) 2)  ! Ery-Tab (Erythromycin Base) 3)  ! Adhesive Paper (Adhesive Tape) 4)  ! Biaxin 5)  ! Flagyl (Metronidazole)  Directives: 1)  Living Will   Past History:  Family History: Last updated: March 15, 2010 father died from rectal cancer and heart failure mother is 80 with dementia Family History of Celiac Disease: Daughter Family History of Heart Disease: Father Son has Barrett's esophagus  Social History: Last updated: 2010/03/15 Married Former Smoker Alcohol Use - yes-1 glass wine daily Daily Caffeine Use Illicit Drug Use - no  Risk Factors: Alcohol Use: <1 (08/25/2010) Caffeine Use: 1 (12/04/2007) Exercise: no (12/04/2007)  Risk Factors: Smoking Status: quit (08/25/2010) Packs/Day: 1-2 packs (08/25/2010) Passive Smoke Exposure: no (08/25/2010)  Past medical, surgical, family and social histories (including risk factors) reviewed, and no changes noted (except as noted below).  Past Medical History: Reviewed history from 12/04/2009 and no changes required. Hypertension Diverticulosis COLON POLYPS 10/10,TUBULAR ADENOMA AND ONE HYPERPLASTIC Collagenous colitis 2005 Hypertension  Past Surgical History: Reviewed history from 03/15/10 and no changes required. Appendectomy Tonsillectomy Cataract extraction Moles removed Surgerical Removal of  Morton's Neuroma in Right Foot Mouth surgery (for exostosis)  Family History: Reviewed history from 03-15-10 and no changes required. father died from rectal cancer and heart failure mother is 1 with dementia Family History of Celiac Disease: Daughter Family History of Heart Disease: Father Son has Barrett's esophagus  Social History: Reviewed history from 15-Mar-2010 and no changes required. Married Former Smoker Alcohol Use - yes-1 glass wine daily Daily Caffeine Use Illicit Drug Use - no  Review of Systems  The patient denies anorexia, fever, weight loss, weight gain, vision loss, decreased hearing, hoarseness, chest pain, syncope, dyspnea on exertion, peripheral edema, prolonged cough, headaches, hemoptysis, abdominal pain, melena, hematochezia, severe indigestion/heartburn, hematuria, incontinence, genital sores, muscle weakness, suspicious skin lesions, transient blindness, difficulty walking, depression, unusual weight change, abnormal bleeding, enlarged lymph nodes, angioedema, and breast masses.    Physical Exam  General:  Well-developed,well-nourished,in no acute distress; alert,appropriate and cooperative throughout examination Head:  Normocephalic and atraumatic without obvious abnormalities. No apparent alopecia or balding. Eyes:  pupils equal and pupils round.   Nose:  no external deformity and no nasal discharge.   Lungs:  normal respiratory effort and  no intercostal retractions.   Heart:  normal rate and regular rhythm.   Abdomen:  soft, non-tender, and normal bowel sounds.   Msk:  no redness over joints, no joint deformities, joint tenderness, and joint swelling.   tnedon sheeth is tender with stretching ( flexion of the ankle)   Impression & Recommendations:  Problem # 1:  ACHILLES TENDON TEAR (ICD-845.09)  Instructed to use a compression wrap, elevate the affected area, apply ICE for 20 minutes every hour while awake for next 3 days, and rest. Start  physical therapy as directed and recheck in 10-14 days if no improvement, sooner if worse.  Orders: Radiology Referral (Radiology)  set up MRI asap  Complete Medication List: 1)  Nadolol-bendroflumethiazide 40-5 Mg Tabs (Nadolol-bendroflumethiazide) .... One by mouth daily 2)  Vitamin D-1000 Max St [phone] Mg-unit Tabs (Calcium-vitamin d) .... Once daily 3)  Tums 500 Mg Chew (Calcium carbonate antacid) .... Three times a day 4)  Fish Oil 1000 Mg Caps (Omega-3 fatty acids) .... Pt states 10,000 once daily 5)  Ocuvite Tabs (Multiple vitamins-minerals) .... 2 once daily 6)  Azithromycin 250 Mg Tabs (Azithromycin) .... 2 by mouth today then one by mouth once daily for 4 days  Patient Instructions: 1)  consider immobilization but MRI is needed for possible tendon tear

## 2011-01-25 NOTE — Consult Note (Signed)
Summary: [facility]  [facility]   Imported By: Maryln Gottron 09/13/2010 12:43:19  _____________________________________________________________________  External Attachment:    Type:   Image     Comment:   External Document

## 2011-01-25 NOTE — Procedures (Signed)
Summary: Upper Endoscopy  Patient: Adriana Hendricks Note: All result statuses are Final unless otherwise noted.  Tests: (1) Upper Endoscopy (EGD)   EGD Upper Endoscopy       DONE     [facility]     [address]     [place], [place]  [address]           ENDOSCOPY PROCEDURE REPORT           PATIENT:  Adriana Hendricks, Adriana Hendricks  MR#:  [id number]     BIRTHDATE:  Dec 16, 1935, 74 yrs. old  GENDER:  female           ENDOSCOPIST:  Hedwig Morton. Juanda Chance, MD     Referred by:  Stacie Glaze, M.D.           PROCEDURE DATE:  04/20/2010     PROCEDURE:  EGD with dilatation over guidewire     ASA CLASS:  Class I     INDICATIONS:  dysphagia           MEDICATIONS:   Versed 7 mg, Fentanyl 75 mcg     TOPICAL ANESTHETIC:  Exactacain Spray           DESCRIPTION OF PROCEDURE:   After the risks benefits and     alternatives of the procedure were thoroughly explained, informed     consent was obtained.  The [facility] [id number] endoscope was     introduced through the mouth and advanced to the second portion of     the duodenum, without limitations.  The instrument was slowly     withdrawn as the mucosa was fully examined.     <<PROCEDUREIMAGES>>           A stricture was found in the distal esophagus (see image1). benign     appearing fibrous stricture, bled after scope passed, diameter     about 12 mm, Savary dilation over a guidewire     13,14,15,97mmdilators  Esophagitis was found (see image2). Grade 1     esophagitis  A hiatal hernia was found (see image4). 3 cm hiatal     hernia 38-41 cm  Otherwise the examination was normal (see     image3).    Retroflexed views revealed no abnormalities.    The     scope was then withdrawn from the patient and the procedure     completed.           COMPLICATIONS:  None           ENDOSCOPIC IMPRESSION:     1) Stricture in the distal esophagus     2) Esophagitis     3) Hiatal hernia     4) Otherwise normal examination     s/p savary dilation to 89F  RECOMMENDATIONS:     1) Anti-reflux regimen to be follow     Prelosec 20 mg, # 60, 1 po qd, 10 refills           REPEAT EXAM:  In 0 year(s) for.  redilate prn           ______________________________     Hedwig Morton. Juanda Chance, MD           CC:           n.     eSIGNED:   Hedwig Morton. Hilari Wethington at 04/20/2010 09:43 AM           Bethanne Ginger, [id number]  Note: An exclamation mark (!) indicates a result  that was not dispersed into the flowsheet. Document Creation Date: 04/20/2010 9:43 AM _______________________________________________________________________  (1) Order result status: Final Collection or observation date-time: 04/20/2010 09:32 Requested date-time:  Receipt date-time:  Reported date-time:  Referring Physician:   Ordering Physician: Lina Sar [phone]) Specimen Source:  Source: Launa Grill Order Number: [id number] Lab site:

## 2011-01-25 NOTE — Progress Notes (Signed)
Summary: REQ FOR APPT  Phone Note Call from Patient Message from:  Patient  Caller: Patient     [phone] Reason for Call: Acute Illness Summary of Call: Pt called in to adv that she is still exp pain, redness, heat/burning to area, red streaks going around leg in region of pain -  intermittent pain rated at a 6 out of 10....(same as it was last Friday, 8/26 when she saw Dr Caryl Never) ... Pt adv she has finished her abx.... needs to know what to do?  Offered OV with another physician but she adv she wants to see Dr Lovell Sheehan and if necessary pts husband adv she could have his appt that is scheduled for 3:15pm if they both cannot be seen..... Per pt - Can you advise if she can be seen at same  time as her husband?  Initial call taken by: Debbra Riding,  August 25, 2010 8:30 AM  Follow-up for Phone Call        ov this pm Follow-up by: Willy Eddy, LPN,  August 25, 2010 10:06 AM

## 2011-01-25 NOTE — Assessment & Plan Note (Signed)
Summary: INTERNAL INFLAMATION OF THE HEEL/HORIZONTAL RED STREAK/FEVERI...   Vital Signs:  Patient profile:   76 year old female Temp:     98.8 degrees F oral BP sitting:   150 / 84  (left arm) Cuff size:   regular  Vitals Entered By: Sid Falcon LPN (August 20, 2010 1:45 PM)  History of Present Illness: Followup left foot pain. Refer to previous note.  Had  twisting injury and subsequent x-ray negative. Possible tendon injury.  no evidence for Achilles rupture. Patient has been treating with ice and elevation and compression.  Now presents with some redness involving the left foot with some erythematous streaks and localized warmth past few days. This is tender to palpation and nonpruritic.  Pain is somewhat of a burning quality. No systemic fever or chills.  Erythema is a new finding.  Tried ice without improvement.  Allergies: 1)  ! Penicillin G Procaine (Penicillin G Procaine) 2)  ! Ery-Tab (Erythromycin Base) 3)  ! Adhesive Paper (Adhesive Tape) 4)  ! Biaxin 5)  ! Flagyl (Metronidazole)  Past History:  Past Medical History: Last updated: 12/04/2009 Hypertension Diverticulosis COLON POLYPS 10/10,TUBULAR ADENOMA AND ONE HYPERPLASTIC Collagenous colitis 2005 Hypertension PMH reviewed for relevance  Physical Exam  General:  Well-developed,well-nourished,in no acute distress; alert,appropriate and cooperative throughout examination Extremities:  left foot and ankle examined. Achilles is fully intact. No visible edema. She has some faint erythema somewhat in patchy distribution along the medial aspect of ankle. Minimal warmth and minimal tenderness to palpation. The distal foot pulses. Full strength plantar flexion and dorsiflexion.  Normal distal foot pulses. Neurologic:  strength normal in all extremities and sensation intact to light touch.   Skin:  no rashes, no ecchymoses, no petechiae, and no purpura.     Impression & Recommendations:  Problem # 1:  FOOT PAIN  (ICD-729.5) Assessment New  ?early cellulitis.  Pt allergic to many meds.  Will treat with Z-max.  She has taken in past.  reported intolerance to Biaxin but this was nausea.  If not responding, consider other possibilities-?early complex regional pain syndrome (recent injury, burning quality, color changes).  Orders: Prescription Created Electronically [phone])  Complete Medication List: 1)  Nadolol-bendroflumethiazide 40-5 Mg Tabs (Nadolol-bendroflumethiazide) .... One by mouth daily 2)  Vitamin D-1000 Max St [phone] Mg-unit Tabs (Calcium-vitamin d) .... Once daily 3)  Tums 500 Mg Chew (Calcium carbonate antacid) .... Three times a day 4)  Fish Oil 1000 Mg Caps (Omega-3 fatty acids) .... Pt states 10,000 once daily 5)  Ocuvite Tabs (Multiple vitamins-minerals) .... 2 once daily 6)  Azithromycin 250 Mg Tabs (Azithromycin) .... 2 by mouth today then one by mouth once daily for 4 days  Patient Instructions: 1)  Elevate foot frequently. 2)  Use heating pad several times daily. Prescriptions: AZITHROMYCIN 250 MG TABS (AZITHROMYCIN) 2 by mouth today then one by mouth once daily for 4 days  #6 x 0   Entered and Authorized by:   Evelena Peat MD   Signed by:   Evelena Peat MD on 08/20/2010   Method used:   Electronically to        [facility]  [phone]* (retail)       [address]       [place], [place]  [address]       Ph: [phone] or [phone]       Fax: [phone]   RxID:   [id number]

## 2011-01-25 NOTE — Assessment & Plan Note (Signed)
Summary: heel pain/njr   Vital Signs:  Patient profile:   76 year old female Height:      66 inches Weight:      156 pounds BMI:     25.27 Temp:     98.2 degrees F oral Pulse rate:   72 / minute Resp:     14 per minute BP sitting:   140 / 80  (left arm)  Vitals Entered By: Willy Eddy, LPN (August 03, 2010 11:53 AM) CC: c/o left foot pain on the inside and outside aspect of heel Is Patient Diabetic? No   Primary Care Antiono Ettinger:  Darryll Capers, MD   CC:  c/o left foot pain on the inside and outside aspect of heel.  History of Present Illness: heel pain in the lateral aspects of the calcaneous had a twisting injury to the heal has wrapped the site and this has helped pain is 8/10 intermitantly has not had xray eye pain and hx of retinal tear also has htn  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) 10)  Adverse Drug Reaction  (ICD-995.20) 11)  Family Hx Colon Cancer  (ICD-V16.0) 12)  Rectal Bleeding  (ICD-569.3) 13)  Diarrhea-presumed Infectious  (ICD-009.3) 14)  Gastroesophageal Reflux Disease  (ICD-530.81) 15)  Insect Bite, Upper Arm  (ICD-912.4) 16)  Biceps Tendinitis, Right  (ICD-726.12) 17)  Acne Rosacea  (ICD-695.3) 18)  Physical Examination  (ICD-V70.0) 19)  Collagenous Colitis  (ICD-710.9) 20)  Diverticulosis, Colon  (ICD-562.10) 21)  Retinal Detachment, 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) 22)  Morton's Neuroma, Hx of   (ICD-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.9) 23)  Hypertension  (ICD-401.9) 24)  Tremor, Essential  (ICD-333.1)  Current Problems (verified): 1)  Dysphagia  (ICD-787.29) 2)  Fh of Colon Cancer  (ICD-153.9) 3)  Hyponatremia  (ICD-276.1) 4)  Hyperlipidemia, Mild  (ICD-272.4) 5)  Preventive Health Care  (ICD-V70.0) 6)  Colonic Polyps, Adenomatous, Hx of  (ICD-V12.72) 7)  Diarrhea  (ICD-787.91) 8)  Abdominal Pain -generalized  (ICD-789.07) 9)  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 One By Mouth Daily 2)  Vitamin D-1000 Max St [phone] Mg-Unit  Tabs (Calcium-Vitamin D) .... Once Daily 3)  Tums 500 Mg  Chew (Calcium Carbonate Antacid) .... Three  Times A Day 4)  Restasis 0.05 % Emul (Cyclosporine) .... Once Daily 5)  Fish Oil 1000 Mg Caps (Omega-3 Fatty Acids) .... Pt States 10,000 Once Daily 6)  Ocuvite  Tabs (Multiple Vitamins-Minerals) .Marland Kitchen.. 1 Two Times A Day  Current Medications (verified): 1)  Nadolol-Bendroflumethiazide 40-5 Mg  Tabs (Nadolol-Bendroflumethiazide) .... One By Mouth Daily 2)  Vitamin D-1000 Max St [phone] Mg-Unit  Tabs (Calcium-Vitamin D) .... Once Daily 3)  Tums 500 Mg  Chew (Calcium Carbonate Antacid) .... Three Times A Day 4)  Fish Oil 1000 Mg Caps (Omega-3 Fatty Acids) .... Pt  States 10,000 Once Daily 5)  Ocuvite  Tabs (Multiple Vitamins-Minerals) .... 2 Once Daily  Allergies (verified): 1)  ! Penicillin G Procaine (Penicillin G Procaine) 2)  ! Ery-Tab (Erythromycin Base) 3)  ! Adhesive Paper (Adhesive Tape) 4)  ! Biaxin 5)  ! Flagyl (Metronidazole)  Directives: 1)  Living Will   Past History:  Family History: Last updated: April 02, 2010 father died from rectal cancer and heart failure mother is 35 with dementia Family History of Celiac Disease: Daughter Family History of Heart Disease: Father Son has Barrett's esophagus  Social History: Last updated: 2010/04/02 Married Former Smoker Alcohol Use - yes-1 glass wine daily Daily Caffeine Use Illicit Drug Use - no  Risk Factors: Alcohol Use: <1 (08/03/2010) Caffeine Use: 1 (12/04/2007) Exercise: no (12/04/2007)  Risk Factors: Smoking Status: quit (08/03/2010) Packs/Day: 1-2 packs (08/03/2010) Passive Smoke Exposure: no (08/03/2010)  Past medical, surgical, family and social histories (including risk factors) reviewed, and no changes noted (except as noted below).  Past Medical History: Reviewed history from 12/04/2009 and no changes required. Hypertension Diverticulosis COLON POLYPS 10/10,TUBULAR ADENOMA AND ONE HYPERPLASTIC Collagenous colitis 2005 Hypertension  Past Surgical History: Reviewed history from April 02, 2010 and no changes required. Appendectomy Tonsillectomy Cataract extraction Moles removed Surgerical Removal of Morton's Neuroma in Right Foot Mouth surgery (for exostosis)  Family History: Reviewed history from Apr 02, 2010 and no changes required. father died from rectal cancer and heart failure mother is 37 with dementia Family History of Celiac Disease: Daughter Family History of Heart Disease: Father Son has Barrett's esophagus  Social History: Reviewed history from 04/02/2010 and no changes required. Married Former Smoker Alcohol Use - yes-1 glass wine  daily Daily Caffeine Use Illicit Drug Use - no  Review of Systems       The patient complains of headaches.  The patient denies anorexia, fever, weight loss, weight gain, vision loss, decreased hearing, hoarseness, chest pain, syncope, dyspnea on exertion, peripheral edema, prolonged cough, hemoptysis, abdominal pain, melena, hematochezia, severe indigestion/heartburn, hematuria, incontinence, genital sores, muscle weakness, suspicious skin lesions, transient blindness, difficulty walking, depression, unusual weight change, abnormal bleeding, enlarged lymph nodes, angioedema, and breast masses.    Physical Exam  General:  Well-developed,well-nourished,in no acute distress; alert,appropriate and cooperative throughout examination Head:  Normocephalic and atraumatic without obvious abnormalities. No apparent alopecia or balding. Eyes:  pupils equal and pupils round.   Ears:  R ear normal and L ear normal.   Nose:  no external deformity and no nasal discharge.   Mouth:  pharynx pink and moist and no erythema.   Neck:  No deformities, masses, or tenderness noted. Lungs:  normal respiratory effort and no intercostal retractions.   Heart:  normal rate and regular rhythm.   Msk:  decreased ROM, joint tenderness, and joint swelling.  ankle Neurologic:  alert & oriented X3 and gait normal.     Impression & Recommendations:  Problem # 1:  HEEL PAIN, LEFT (ICD-729.5)  possible tendous  injury continue wraps during the day xrays today  Orders: T-Ankle Comp Left Min 3 Views ([id number])  Problem # 2:  RETINAL DETACHMENT, HX OF (ICD-V15.9) Dec first had detachment notes with lifting has eye pain in the left eye no visual changes moniter blood pressure  Problem # 3:  HYPERTENSION (ICD-401.9)  Her updated medication list for this problem includes:    Nadolol-bendroflumethiazide 40-5 Mg Tabs (Nadolol-bendroflumethiazide) ..... One by mouth daily  BP today: 140/80 Prior BP: 136/80  (07/14/2010)  Prior 10 Yr Risk Heart Disease: 7 % (07/14/2010)  Labs Reviewed: K+: 4.0 (07/14/2010) Creat: : 0.6 (07/14/2010)   Chol: 206 (01/07/2010)   HDL: 69.00 (01/07/2010)   LDL: 115 (07/24/2009)   TG: 106.0 (07/24/2009)  Orders: Durable Medical Equipment (DME)  Complete Medication List: 1)  Nadolol-bendroflumethiazide 40-5 Mg Tabs (Nadolol-bendroflumethiazide) .... One by mouth daily 2)  Vitamin D-1000 Max St [phone] Mg-unit Tabs (Calcium-vitamin d) .... Once daily 3)  Tums 500 Mg Chew (Calcium carbonate antacid) .... Three times a day 4)  Fish Oil 1000 Mg Caps (Omega-3 fatty acids) .... Pt states 10,000 once daily 5)  Ocuvite Tabs (Multiple vitamins-minerals) .... 2 once daily  Patient Instructions: 1)  moniter blood pressure  2)  Please schedule a follow-up appointment in 6 -7weeks.

## 2011-01-25 NOTE — Progress Notes (Signed)
Summary: refill  Phone Note Refill Request Call back at Home Phone [phone] Message from:  Patient---voice mail   Refills Requested: Medication #1:  NADOLOL-BENDROFLUMETHIAZIDE 40-5 MG  TABS one by mouth daily send to [facility]. almost out.  Initial call taken by: Warnell Forester,  September 21, 2010 2:12 PM    Prescriptions: NADOLOL-BENDROFLUMETHIAZIDE 40-5 MG  TABS (NADOLOL-BENDROFLUMETHIAZIDE) one by mouth daily  #90 x 3   Entered by:   Willy Eddy, LPN   Authorized by:   Stacie Glaze MD   Signed by:   Willy Eddy, LPN on 09/81/1914   Method used:   Electronically to        [facility]* (retail)             ,          Ph: [phone]       Fax: [phone]   RxID:   [id number]

## 2011-01-25 NOTE — Letter (Signed)
Summary: EGD Instructions  [facility]  [address] [place], [place] [address]   Phone: [phone]  Fax: [phone]       Adriana Hendricks    1935/05/06    MRN: [id number]       Procedure Day /Date: 04/01/10 Thursday     Arrival Time: 8:00 am     Procedure Time: 9:00 am     Location of Procedure:                    _ x _ [facility] (4th Floor)  PREPARATION FOR ENDOSCOPY   On 04/01/10 THE DAY OF THE PROCEDURE:  1.   No solid foods, milk or milk products are allowed after midnight the night before your procedure.  2.   Do not drink anything colored red or purple.  Avoid juices with pulp.  No orange juice.  3.  You may drink clear liquids until , which is 2 hours before your procedure.                                                                                                CLEAR LIQUIDS INCLUDE: Water Jello Ice Popsicles Tea (sugar ok, no milk/cream) Powdered fruit flavored drinks Coffee (sugar ok, no milk/cream) Gatorade Juice: apple, white grape, white cranberry  Lemonade Clear bullion, consomm, broth Carbonated beverages (any kind) Strained chicken noodle soup Hard Candy   MEDICATION INSTRUCTIONS  Unless otherwise instructed, you should take regular prescription medications with a small sip of water as early as possible the morning of your procedure.                  OTHER INSTRUCTIONS  You will need a responsible adult at least 74 years of age to accompany you and drive you home.   This person must remain in the waiting room during your procedure.  Wear loose fitting clothing that is easily removed.  Leave jewelry and other valuables at home.  However, you may wish to bring a book to read or an iPod/MP3 player to listen to music as you wait for your procedure to start.  Remove all body piercing jewelry and leave at home.  Total time from sign-in until discharge is approximately 2-3 hours.  You should go home  directly after your procedure and rest.  You can resume normal activities the day after your procedure.  The day of your procedure you should not:   Drive   Make legal decisions   Operate machinery   Drink alcohol   Return to work  You will receive specific instructions about eating, activities and medications before you leave.    The above instructions have been reviewed and explained to me by   [name] [name]  March 16, 2010 4:28 PM     I fully understand and can verbalize these instructions _____________________________ Date 03/16/10

## 2011-01-25 NOTE — Assessment & Plan Note (Signed)
Summary: flu shot/njr  Nurse Visit   Review of Systems       Flu Vaccine Consent Questions     Do you have a history of severe allergic reactions to this vaccine? no    Any prior history of allergic reactions to egg and/or gelatin? no    Do you have a sensitivity to the preservative Thimersol? no    Do you have a past history of Guillan-Barre Syndrome? no    Do you currently have an acute febrile illness? no    Have you ever had a severe reaction to latex? no    Vaccine information given and explained to patient? yes    Are you currently pregnant? no    Lot Number:[id number]   Exp Date:06/25/2011   Site Given  Left Deltoid IM    Allergies: 1)  ! Penicillin G Procaine (Penicillin G Procaine) 2)  ! Ery-Tab (Erythromycin Base) 3)  ! Adhesive Paper (Adhesive Tape) 4)  ! Biaxin 5)  ! Flagyl (Metronidazole)  Orders Added: 1)  Flu Vaccine 69yrs + MEDICARE PATIENTS [Q2039] 2)  Administration Flu vaccine - MCR [G0008]

## 2011-01-25 NOTE — Assessment & Plan Note (Signed)
Summary: 1 mo rov/mm   Vital Signs:  Patient profile:   76 year old female Height:      66 inches Weight:      158 pounds BMI:     25.59 Temp:     98.6 degrees F oral Pulse rate:   72 / minute Resp:     14 per minute BP sitting:   140 / 80  (left arm)  Vitals Entered By: Willy Eddy, LPN (February 08, 2010 10:47 AM) CC: roa- trial of prilosec helped but sx returned greater after completly   Primary Care Provider:  Darryll Capers, MD   CC:  roa- trial of prilosec helped but sx returned greater after completly.  History of Present Illness: follow up lipids and metabolic panel with persistant GI distress even though the dirrhea has stopped stool remained loose and abdmonila pain is 5/10 with increased gas the labs reviealed low  NA and CL ( compensated alcolosis) certainly lactose intolerance, gluten intolerance may play a role   Preventive Screening-Counseling & Management  Alcohol-Tobacco     Alcohol drinks/day: <1     Smoking Status: quit     Packs/Day: 1-2 packs     Year Started: 1959     Year Quit: 1969     Passive Smoke Exposure: no  Current Problems (verified): 1)  Hyperlipidemia, Mild  (ICD-272.4) 2)  Preventive Health Care  (ICD-V70.0) 3)  Personal Hx Colonic Polyps  (ICD-V12.72) 4)  Diarrhea  (ICD-787.91) 5)  Abdominal Pain -generalized  (ICD-789.07) 6)  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) 7)  Adverse Drug Reaction  (ICD-995.20) 8)  Family Hx Colon Cancer  (ICD-V16.0) 9)  Rectal Bleeding  (ICD-569.3) 10)  Diarrhea-presumed Infectious  (ICD-009.3) 11)  Gastroesophageal Reflux Disease  (ICD-530.81) 12)  Insect Bite, Upper Arm  (ICD-912.4) 13)  Biceps Tendinitis, Right  (ICD-726.12) 14)  Acne Rosacea  (ICD-695.3) 15)  Physical Examination  (ICD-V70.0) 16)  Collagenous Colitis  (ICD-710.9) 17)  Diverticulosis, Colon  (ICD-562.10) 18)  Retinal Detachment, 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) 19)  Morton's Neuroma, 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) 20)  Hypertension   (ICD-401.9) 21)  Tremor, Essential  (ICD-333.1)  Current Medications (verified): 1)  Nadolol-Bendroflumethiazide 40-5 Mg  Tabs (Nadolol-Bendroflumethiazide) .... One By Mouth Daily 2)  Turmeric Curcumin   Caps (Misc Natural Products) .... Once Daily 3)  Vitamin D-1000 Max St [phone] Mg-Unit  Tabs (Calcium-Vitamin D) .... Once Daily 4)  Tums 500 Mg  Chew (Calcium Carbonate Antacid) .... Three Times A Day 5)  Refresh P.m.  Oint (Artificial Tear Ointment) .... Use As Directed 6)  Restasis 0.05 % Emul (Cyclosporine) .... Use As Directed 7)  Fish Oil 1000 Mg Caps (Omega-3 Fatty Acids) .... One By Mouth Two Times A Day 8)  Prilosec 20 Mg Cpdr (Omeprazole) .... One By Mouth Daily As A Trila For The Swallowing  Allergies (verified): 1)  ! Penicillin G Procaine (Penicillin G Procaine) 2)  ! Ery-Tab (Erythromycin Base) 3)  ! Adhesive Paper (Adhesive Tape) 4)  ! Biaxin 5)  ! Flagyl (Metronidazole)  Past History:  Family History: Last updated: 16-Dec-2007 father died from rectal cancer and heart failure mother is 56 with dementia  Social History: Last updated: 07/03/2009 Married Former Smoker Alcohol Use - yes Daily Caffeine Use Illicit Drug Use - no  Risk Factors: Alcohol Use: <1 (02/08/2010) Caffeine Use: 1 (2007/12/16) Exercise: no (2007-12-16)  Risk Factors: Smoking Status: quit (02/08/2010) Packs/Day: 1-2 packs (02/08/2010) Passive Smoke Exposure: no (02/08/2010)  Past  medical, surgical, family and social histories (including risk factors) reviewed, and no changes noted (except as noted below).  Past Medical History: Reviewed history from 12/04/2009 and no changes required. Hypertension Diverticulosis COLON POLYPS 10/10,TUBULAR ADENOMA AND ONE HYPERPLASTIC Collagenous colitis 2005 Hypertension  Past Surgical History: Reviewed history from 07/03/2009 and no changes required. Appendectomy Tonsillectomy Cataract extraction Moles removed  Family History: Reviewed  history from 12/04/2007 and no changes required. father died from rectal cancer and heart failure mother is 28 with dementia  Social History: Reviewed history from 07/03/2009 and no changes required. Married Former Smoker Alcohol Use - yes Daily Caffeine Use Illicit Drug Use - no  Review of Systems  The patient denies anorexia, fever, weight loss, weight gain, vision loss, decreased hearing, hoarseness, chest pain, syncope, dyspnea on exertion, peripheral edema, prolonged cough, headaches, hemoptysis, abdominal pain, melena, hematochezia, severe indigestion/heartburn, hematuria, incontinence, genital sores, muscle weakness, suspicious skin lesions, transient blindness, difficulty walking, depression, unusual weight change, abnormal bleeding, enlarged lymph nodes, angioedema, and breast masses.    Physical Exam  General:  Well-developed,well-nourished,in no acute distress; alert,appropriate and cooperative throughout examination Head:  Normocephalic and atraumatic without obvious abnormalities. No apparent alopecia or balding. Eyes:  conjunctival injection.   Ears:  R ear normal and L ear normal.   Nose:  no external deformity.   Mouth:  pharynx pink and moist and no erythema.   Neck:  No deformities, masses, or tenderness noted. Lungs:  Normal respiratory effort, chest expands symmetrically. Lungs are clear to auscultation, no crackles or wheezes. Heart:  Normal rate and regular rhythm. S1 and S2 normal without gallop, murmur, click, rub or other extra sounds. Abdomen:  Bowel sounds positive,abdomen soft and non-tender without masses, organomegaly or hernias noted. Msk:  No deformity or scoliosis noted of thoracic or lumbar spine.     Impression & Recommendations:  Problem # 1:  HYPONATREMIA (ICD-276.1)  check serum osmolity and urine sodium and urine osmolality replace water with propell or gatoraid ( lemon flavored) repeat the na in 1 monthj and follow up consider urine workup  for carcinoid?  Orders: TLB-Renal Function Panel (80069-RENAL) Venipuncture ([address]) T- * Misc. Laboratory test [phone])  Problem # 2:  DIARRHEA (ICD-787.91) related to numnber one consider food intolerace with consideration for a small bowel carcinoid if we cannot diagnos the cause fo the persistant diarrhea.  Problem # 3:  HYPERLIPIDEMIA, MILD (ICD-272.4)  Labs Reviewed: SGOT: 22 (07/24/2009)   SGPT: 19 (07/24/2009)  Prior 10 Yr Risk Heart Disease: 9 % (11/02/2009)   HDL:69.00 (01/07/2010), 54.50 (07/24/2009)  LDL:115 (07/24/2009), 121 (12/04/2008)  Chol:206 (01/07/2010), 191 (07/24/2009)  Trig:106.0 (07/24/2009), 57 (12/04/2008)  Complete Medication List: 1)  Nadolol-bendroflumethiazide 40-5 Mg Tabs (Nadolol-bendroflumethiazide) .... One by mouth daily 2)  Turmeric Curcumin Caps (Misc natural products) .... Once daily 3)  Vitamin D-1000 Max St [phone] Mg-unit Tabs (Calcium-vitamin d) .... Once daily 4)  Tums 500 Mg Chew (Calcium carbonate antacid) .... Three times a day 5)  Refresh P.m. Oint (Artificial tear ointment) .... Use as directed 6)  Restasis 0.05 % Emul (Cyclosporine) .... Use as directed 7)  Fish Oil 1000 Mg Caps (Omega-3 fatty acids) .... One by mouth two times a day 8)  Prilosec 20 Mg Cpdr (Omeprazole) .... One by mouth daily as a trila for the swallowing  Other Orders: Zoster (Shingles) Vaccine Live [phone]) Admin 1st Vaccine (91478)  Patient Instructions: 1)  refer back to GI ( pt to call)  gluten or dairy intolerance? 2)  Please schedule a follow-up appointment in 1 month. 3)  BMP prior to visit, ICD-9:276.1   Immunizations Administered:  Zostavax # 1:    Vaccine Type: Zostavax    Site: left deltoid    Mfr: Merck    Dose: 0.5 ml    Route: [place]    Given by: Willy Eddy, LPN    Exp. Date: 01/13/2011    Lot #: [id number]

## 2011-01-25 NOTE — Assessment & Plan Note (Signed)
Summary: 2 MONTH FUP//CCM   Vital Signs:  Patient profile:   76 year old female Height:      66 inches Weight:      160 pounds BMI:     25.92 Temp:     98.8 degrees F oral Pulse rate:   72 / minute Resp:     14 per minute BP sitting:   140 / 78  (left arm)  Vitals Entered By: Willy Eddy, LPN (November 22, 2010 9:24 AM) CC: f/u after having hypo natremia in sept ov, Hypertension Management Is Patient Diabetic? No   Primary Care Provider:  Darryll Capers, MD   CC:  f/u after having hypo natremia in sept ov and Hypertension Management.  History of Present Illness: monitering sodium and has increased repeat labs today and monitering no symptoms of hyponatrimia   Hypertension History:      She denies headache, chest pain, palpitations, dyspnea with exertion, orthopnea, PND, peripheral edema, visual symptoms, neurologic problems, syncope, and side effects from treatment.        Positive major cardiovascular risk factors include female age 45 years old or older, hyperlipidemia, and hypertension.  Negative major cardiovascular risk factors include negative family history for ischemic heart disease and non-tobacco-user status.     Preventive Screening-Counseling & Management  Alcohol-Tobacco     Alcohol drinks/day: <1     Smoking Status: never     Packs/Day: 1-2 packs     Year Started: 1959     Year Quit: 1969     Passive Smoke Exposure: no     Tobacco Counseling: not indicated; no tobacco use  Problems Prior to Update: 1)  Achilles Tendon Tear  (ICD-845.09) 2)  Heel Pain, Left  (ICD-729.5) 3)  Dysphagia  (ICD-787.29) 4)  Fh of Colon Cancer  (ICD-153.9) 5)  Hyponatremia  (ICD-276.1) 6)  Hyperlipidemia, Mild  (ICD-272.4) 7)  Preventive Health Care  (ICD-V70.0) 8)  Colonic Polyps, Adenomatous, Hx of  (ICD-V12.72) 9)  Diarrhea  (ICD-787.91) 10)  Abdominal Pain -generalized  (ICD-789.07) 11)  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) 12)  Adverse Drug Reaction   (ICD-995.20) 13)  Family Hx Colon Cancer  (ICD-V16.0) 14)  Rectal Bleeding  (ICD-569.3) 15)  Diarrhea-presumed Infectious  (ICD-009.3) 16)  Gastroesophageal Reflux Disease  (ICD-530.81) 17)  Insect Bite, Upper Arm  (ICD-912.4) 18)  Biceps Tendinitis, Right  (ICD-726.12) 19)  Acne Rosacea  (ICD-695.3) 20)  Physical Examination  (ICD-V70.0) 21)  Collagenous Colitis  (ICD-710.9) 22)  Diverticulosis, Colon  (ICD-562.10) 23)  Retinal Detachment, 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) 24)  Morton's Neuroma, 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) 25)  Hypertension  (ICD-401.9) 26)  Tremor, Essential  (ICD-333.1)  Current Problems (verified): 1)  Achilles Tendon Tear  (ICD-845.09) 2)  Heel Pain, Left  (ICD-729.5) 3)  Dysphagia  (ICD-787.29) 4)  Fh of Colon Cancer  (ICD-153.9) 5)  Hyponatremia  (ICD-276.1) 6)  Hyperlipidemia, Mild  (ICD-272.4) 7)  Preventive Health Care  (ICD-V70.0) 8)  Colonic Polyps, Adenomatous, Hx of  (ICD-V12.72) 9)  Diarrhea  (ICD-787.91) 10)  Abdominal Pain -generalized  (ICD-789.07) 11)  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 One By Mouth Daily 2)  Vitamin D-1000 Max St [phone] Mg-Unit  Tabs (Calcium-Vitamin D) .... Once Daily 3)  Tums 500 Mg  Chew (Calcium Carbonate Antacid) ....  Three Times A Day 4)  Fish Oil 1000 Mg Caps (Omega-3 Fatty Acids) .... Pt States 10,000 Once Daily 5)  Ocuvite  Tabs (Multiple Vitamins-Minerals) .... 2 Once Daily 6)  Azithromycin 250 Mg Tabs (Azithromycin) .... 2 By Mouth Today Then One By Mouth Once Daily For 4 Days  Current Medications (verified): 1)  Nadolol-Bendroflumethiazide 40-5 Mg  Tabs (Nadolol-Bendroflumethiazide) .... One By Mouth Daily 2)  Vitamin D-1000 Max St [phone] Mg-Unit  Tabs (Calcium-Vitamin D) .... Once Daily 3)  Fish Oil 1000 Mg Caps (Omega-3 Fatty Acids) .... Pt States 10,000 Once Daily 4)  Ocuvite  Tabs (Multiple Vitamins-Minerals) .... 2 Once Daily  Allergies (verified): 1)  ! Penicillin G Procaine (Penicillin G Procaine) 2)  ! Ery-Tab (Erythromycin Base) 3)  ! Adhesive Paper (Adhesive Tape) 4)  ! Biaxin 5)  ! Flagyl (Metronidazole)  Directives: 1)  Living Will   Past History:  Family History: Last updated: 04-09-2010 father died from rectal cancer and heart failure mother is 58 with dementia Family History of Celiac Disease: Daughter Family History of Heart Disease: Father Son has Barrett's esophagus  Social History: Last updated: 2010-04-09 Married Former Smoker Alcohol Use - yes-1 glass wine daily Daily Caffeine Use Illicit Drug Use - no  Risk Factors: Alcohol Use: <1 (11/22/2010) Caffeine Use: 1 (12/04/2007) Exercise: no (12/04/2007)  Risk Factors: Smoking Status: never (11/22/2010) Packs/Day: 1-2 packs (11/22/2010) Passive Smoke Exposure: no (11/22/2010)  Past medical, surgical, family and social histories (including risk factors) reviewed, and no changes noted (except as noted below).  Past Medical History: Reviewed history from 12/04/2009 and no changes required. Hypertension Diverticulosis COLON POLYPS 10/10,TUBULAR ADENOMA AND ONE HYPERPLASTIC Collagenous colitis 2005 Hypertension  Past Surgical History: Reviewed history from 04/09/10 and no changes  required. Appendectomy Tonsillectomy Cataract extraction Moles removed Surgerical Removal of Morton's Neuroma in Right Foot Mouth surgery (for exostosis)  Family History: Reviewed history from 04/09/2010 and no changes required. father died from rectal cancer and heart failure mother is 65 with dementia Family History of Celiac Disease: Daughter Family History of Heart Disease: Father Son has Barrett's esophagus  Social History: Reviewed history from Apr 09, 2010 and no changes required. Married Former Smoker Alcohol Use - yes-1 glass wine daily Daily Caffeine Use Illicit Drug Use - no  Review of Systems  The patient denies anorexia, fever, weight loss, weight gain, vision loss, decreased hearing, hoarseness, chest pain, syncope, dyspnea on exertion, peripheral edema, prolonged cough, headaches, hemoptysis, abdominal pain, melena, hematochezia, severe indigestion/heartburn, hematuria, incontinence, genital sores, muscle weakness, suspicious skin lesions, transient blindness, difficulty walking, depression, unusual weight change, abnormal bleeding, enlarged lymph nodes, angioedema, and breast masses.    Physical Exam  General:  Well-developed,well-nourished,in no acute distress; alert,appropriate and cooperative throughout examination Head:  Normocephalic and atraumatic without obvious abnormalities. No apparent alopecia or balding. Eyes:  pupils equal and pupils round.   Ears:  R ear normal and L ear normal.   Nose:  no external deformity and no nasal discharge.   Mouth:  pharynx pink and  moist and no erythema.   Neck:  No deformities, masses, or tenderness noted. Lungs:  normal respiratory effort and no intercostal retractions.   Heart:  normal rate and regular rhythm.     Impression & Recommendations:  Problem # 1:  HYPONATREMIA (ICD-276.1) Assessment Unchanged  Orders: Venipuncture (16109) TLB-BMP (Basic Metabolic Panel-BMET) (80048-METABOL)  Problem # 2:   HYPERTENSION (ICD-401.9) Assessment: Unchanged  Her updated medication list for this problem includes:    Nadolol-bendroflumethiazide 40-5 Mg Tabs (Nadolol-bendroflumethiazide) ..... One by mouth daily  Orders: TLB-BMP (Basic Metabolic Panel-BMET) (80048-METABOL)  BP today: 140/78 Prior BP: 140/70 (09/14/2010)  Prior 10 Yr Risk Heart Disease: 9 % (09/14/2010)  Labs Reviewed: K+: 4.4 (09/14/2010) Creat: : 0.6 (09/14/2010)   Chol: 206 (01/07/2010)   HDL: 69.00 (01/07/2010)   LDL: 115 (07/24/2009)   TG: 106.0 (07/24/2009)  Problem # 3:  TREMOR, ESSENTIAL (ICD-333.1)  Complete Medication List: 1)  Nadolol-bendroflumethiazide 40-5 Mg Tabs (Nadolol-bendroflumethiazide) .... One by mouth daily 2)  Vitamin D-1000 Max St [phone] Mg-unit Tabs (Calcium-vitamin d) .... Once daily 3)  Fish Oil 1000 Mg Caps (Omega-3 fatty acids) .... Pt states 10,000 once daily 4)  Ocuvite Tabs (Multiple vitamins-minerals) .... 2 once daily  Hypertension Assessment/Plan:      The patient's hypertensive risk group is category B: At least one risk factor (excluding diabetes) with no target organ damage.  Her calculated 10 year risk of coronary heart disease is 9 %.  Today's blood pressure is 140/78.  Her blood pressure goal is < 140/90.  Patient Instructions: 1)  Please schedule a follow-up appointment in 4 months.   Orders Added: 1)  Est. Patient Level IV [60454] 2)  Venipuncture [36415] 3)  TLB-BMP (Basic Metabolic Panel-BMET) [80048-METABOL]  Appended Document: Orders Update    Clinical Lists Changes  Orders: Added new Service order of Specimen Handling (09811) - Signed

## 2011-01-25 NOTE — Assessment & Plan Note (Signed)
Summary: ? gluten or dairy intolerance...em   History of Present Illness Visit Type: Follow-up Visit Primary GI MD: Lina Sar MD Primary Provider: Darryll Capers, MD  Requesting Provider: n/a Chief Complaint: dysphagia, gluten or dairy intolerance? History of Present Illness:   This is a 76 year old white female with a history of collagenous colitis which was found on a colonoscopy in 2002 and 2005. Her last colonoscopy in October 2010 did not show any evidence of colitis. She has a family history of colon cancer in her father and a personal history of an adenomatous polyp of the colon. She is now coming with complaints of solid food dysphagia which has been occurring intermittently and has become progressively worse. It occurs mostly with bread, but not liquids. She had only one episode of regurgitation. She was started on a PPI for several weeks but stopped it after 2 weeks.   GI Review of Systems    Reports belching, bloating, and  dysphagia with solids.      Denies abdominal pain, acid reflux, chest pain, dysphagia with liquids, heartburn, loss of appetite, nausea, vomiting, vomiting blood, weight loss, and  weight gain.        Denies anal fissure, black tarry stools, change in bowel habit, constipation, diarrhea, diverticulosis, fecal incontinence, heme positive stool, hemorrhoids, irritable bowel syndrome, jaundice, light color stool, liver problems, rectal bleeding, and  rectal pain.    Current Medications (verified): 1)  Nadolol-Bendroflumethiazide 40-5 Mg  Tabs (Nadolol-Bendroflumethiazide) .... One By Mouth Daily 2)  Turmeric Curcumin   Caps (Misc Natural Products) .... Once Daily 3)  Vitamin D-1000 Max St [phone] Mg-Unit  Tabs (Calcium-Vitamin D) .... Once Daily 4)  Tums 500 Mg  Chew (Calcium Carbonate Antacid) .... Three Times A Day 5)  Restasis 0.05 % Emul (Cyclosporine) .... Use As Directed 6)  Fish Oil 1000 Mg Caps (Omega-3 Fatty Acids) .... One By Mouth Two Times A  Day  Allergies (verified): 1)  ! Penicillin G Procaine (Penicillin G Procaine) 2)  ! Ery-Tab (Erythromycin Base) 3)  ! Adhesive Paper (Adhesive Tape) 4)  ! Biaxin 5)  ! Flagyl (Metronidazole)  Past History:  Past Medical History: Reviewed history from 12/04/2009 and no changes required. Hypertension Diverticulosis COLON POLYPS 10/10,TUBULAR ADENOMA AND ONE HYPERPLASTIC Collagenous colitis 2005 Hypertension  Past Surgical History: Reviewed history from 03/10/2010 and no changes required. Appendectomy Tonsillectomy Cataract extraction Moles removed Surgerical Removal of Morton's Neuroma in Right Foot Mouth surgery (for exostosis)  Family History: Reviewed history from 03/10/2010 and no changes required. father died from rectal cancer and heart failure mother is 69 with dementia Family History of Celiac Disease: Daughter Family History of Heart Disease: Father Son has Barrett's esophagus  Social History: Reviewed history from 03/10/2010 and no changes required. Married Former Smoker Alcohol Use - yes-1 glass wine daily Daily Caffeine Use Illicit Drug Use - no  Review of Systems       The patient complains of allergy/sinus, heart murmur, and thirst - excessive.  The patient denies anemia, anxiety-new, arthritis/joint pain, back pain, blood in urine, breast changes/lumps, change in vision, confusion, cough, coughing up blood, depression-new, fainting, fatigue, fever, headaches-new, hearing problems, heart rhythm changes, itching, menstrual pain, muscle pains/cramps, night sweats, nosebleeds, pregnancy symptoms, shortness of breath, skin rash, sleeping problems, sore throat, swelling of feet/legs, swollen lymph glands, thirst - excessive , urination - excessive , urination changes/pain, urine leakage, vision changes, and voice change.         Pertinent positive and negative review  of systems were noted in the above HPI. All other ROS was otherwise negative.   Vital  Signs:  Patient profile:   76 year old female Height:      66 inches Weight:      162 pounds BMI:     26.24 Pulse rate:   68 / minute Pulse rhythm:   regular BP sitting:   114 / 70  (left arm)  Vitals Entered By: June McMurray CMA [name] (March 16, 2010 3:31 PM)   Impression & Recommendations:  Problem # 1:  Family Hx of COLON CANCER (ICD-153.9) Patient is status post recent colonoscopy. Her recall colonoscopy will be due in October 2013.  Problem # 2:  DIARRHEA (ICD-787.91) Patient has a history of collagenous colitis not documented on her most recent colonoscopy in October 2010.  Problem # 3:  GASTROESOPHAGEAL REFLUX DISEASE (ICD-530.81) Patient has had recent onset progressive solid food dysphagia suggestive of lower esophageal stricture. We will proceed with an upper endoscopy and possible dilatation. We have discussed antireflux measures and the use of PPIs. She prefers to manage her reflux with  homeopathic approaches.  Other Orders: EGD SAV (EGD SAV)  Patient Instructions: 1)  We will schedule an upper endoscopy with possible dilation and small bowel biopsies to rule out celiac sprue. 2)  Please follow antireflux measures. 3)  Recall colonoscopy October 2013. 4)  Copy sent to : Dr Darryll Capers 5)  The medication list was reviewed and reconciled.  All changed / newly prescribed medications were explained.  A complete medication list was provided to the patient / caregiver.

## 2011-01-25 NOTE — Miscellaneous (Signed)
Summary: rx for prilosec  Clinical Lists Changes  Medications: Added new medication of OMEPRAZOLE 20 MG  CPDR (OMEPRAZOLE) 1 each day 30 minutes before meal - Signed Added new medication of OMEPRAZOLE 20 MG  CPDR (OMEPRAZOLE) 1 each day 30 minutes before meal - Signed Rx of OMEPRAZOLE 20 MG  CPDR (OMEPRAZOLE) 1 each day 30 minutes before meal;  #30 x 0;  Signed;  Entered by: Oda Cogan RN;  Authorized by: Hart Carwin MD;  Method used: Handwritten Rx of OMEPRAZOLE 20 MG  CPDR (OMEPRAZOLE) 1 each day 30 minutes before meal;  #90 x 3;  Signed;  Entered by: Oda Cogan RN;  Authorized by: Hart Carwin MD;  Method used: Handwritten Rx of OMEPRAZOLE 20 MG  CPDR (OMEPRAZOLE) 1 each day 30 minutes before meal;  #30 x 0;  Signed;  Entered by: Oda Cogan RN;  Authorized by: Hart Carwin MD;  Method used: Print then Give to Patient Rx of OMEPRAZOLE 20 MG  CPDR (OMEPRAZOLE) 1 each day 30 minutes before meal;  #90 x 3;  Signed;  Entered by: Oda Cogan RN;  Authorized by: Hart Carwin MD;  Method used: Print then Give to Patient    Prescriptions: OMEPRAZOLE 20 MG  CPDR (OMEPRAZOLE) 1 each day 30 minutes before meal  #90 x 3   Entered by:   Oda Cogan RN   Authorized by:   Hart Carwin MD   Signed by:   Oda Cogan RN on 04/20/2010   Method used:   Print then Give to Patient   RxID:   [id number] OMEPRAZOLE 20 MG  CPDR (OMEPRAZOLE) 1 each day 30 minutes before meal  #30 x 0   Entered by:   Oda Cogan RN   Authorized by:   Hart Carwin MD   Signed by:   Oda Cogan RN on 04/20/2010   Method used:   Print then Give to Patient   RxID:   [id number] OMEPRAZOLE 20 MG  CPDR (OMEPRAZOLE) 1 each day 30 minutes before meal  #90 x 3   Entered by:   Oda Cogan RN   Authorized by:   Hart Carwin MD   Signed by:   Oda Cogan RN on 04/20/2010   Method used:   Handwritten   RxID:   [id number] OMEPRAZOLE 20 MG  CPDR (OMEPRAZOLE) 1 each day 30  minutes before meal  #30 x 0   Entered by:   Oda Cogan RN   Authorized by:   Hart Carwin MD   Signed by:   Oda Cogan RN on 04/20/2010   Method used:   Handwritten   RxID:   [id number]

## 2011-01-25 NOTE — Assessment & Plan Note (Signed)
Summary: ROA/FUP/RCD   Vital Signs:  Patient profile:   76 year old female Weight:      156 pounds Temp:     98.2 degrees F oral BP sitting:   140 / 70  Primary Care Adriana Hendricks:  Darryll Capers, MD    History of Present Illness: moniterin of blood pressure had liberalized sodium due to drop in blod presure noted  but this may have in elevation of blood pressure has a hx of hypertension recent rupture of tendon in ankle, in cast  Hypertension History:      She denies headache, chest pain, palpitations, dyspnea with exertion, orthopnea, PND, peripheral edema, visual symptoms, neurologic problems, syncope, and side effects from treatment.  immoble.        Positive major cardiovascular risk factors include female age 46 years old or older, hyperlipidemia, and hypertension.  Negative major cardiovascular risk factors include negative family history for ischemic heart disease and non-tobacco-user status.     Preventive Screening-Counseling & Management  Alcohol-Tobacco     Smoking Status: never     Tobacco Counseling: not indicated; no tobacco use  Problems Prior to Update: 1)  Achilles Tendon Tear  (ICD-845.09) 2)  Heel Pain, Left  (ICD-729.5) 3)  Dysphagia  (ICD-787.29) 4)  Fh of Colon Cancer  (ICD-153.9) 5)  Hyponatremia  (ICD-276.1) 6)  Hyperlipidemia, Mild  (ICD-272.4) 7)  Preventive Health Care  (ICD-V70.0) 8)  Colonic Polyps, Adenomatous, Hx of  (ICD-V12.72) 9)  Diarrhea  (ICD-787.91) 10)  Abdominal Pain -generalized  (ICD-789.07) 11)  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) 12)  Adverse Drug Reaction  (ICD-995.20) 13)  Family Hx Colon Cancer  (ICD-V16.0) 14)  Rectal Bleeding  (ICD-569.3) 15)  Diarrhea-presumed Infectious  (ICD-009.3) 16)  Gastroesophageal Reflux Disease  (ICD-530.81) 17)  Insect Bite, Upper Arm  (ICD-912.4) 18)  Biceps Tendinitis, Right  (ICD-726.12) 19)  Acne Rosacea  (ICD-695.3) 20)  Physical Examination  (ICD-V70.0) 21)  Collagenous  Colitis  (ICD-710.9) 22)  Diverticulosis, Colon  (ICD-562.10) 23)  Retinal Detachment, 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) 24)  Morton's Neuroma, 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 One By Mouth Daily 2)  Vitamin D-1000 Max St [phone] Mg-Unit  Tabs (Calcium-Vitamin D) .... Once Daily 3)  Tums 500 Mg  Chew (Calcium Carbonate Antacid) .... Three Times A Day 4)  Fish Oil 1000 Mg Caps (Omega-3 Fatty Acids) .... Pt States 10,000 Once Daily 5)  Ocuvite  Tabs (Multiple Vitamins-Minerals) .... 2 Once Daily 6)  Azithromycin 250 Mg Tabs (Azithromycin) .... 2 By Mouth Today Then One By Mouth Once Daily For 4 Days  Current Medications (verified): 1)  Nadolol-Bendroflumethiazide 40-5 Mg  Tabs (Nadolol-Bendroflumethiazide) .... One By Mouth Daily 2)  Vitamin D-1000 Max St [phone] Mg-Unit  Tabs (Calcium-Vitamin D) .... Once Daily 3)  Tums 500 Mg  Chew (Calcium Carbonate Antacid) .... Three Times A Day 4)  Fish Oil 1000 Mg Caps (Omega-3 Fatty Acids) .... Pt States 10,000 Once Daily 5)  Ocuvite  Tabs (Multiple Vitamins-Minerals) .... 2 Once Daily 6)  Azithromycin 250 Mg Tabs (Azithromycin) .... 2 By Mouth Today Then One By Mouth Once Daily For 4 Days  Allergies (verified): 1)  ! Penicillin G Procaine (Penicillin G Procaine) 2)  ! Ery-Tab (Erythromycin Base) 3)  ! Adhesive Paper (Adhesive Tape) 4)  ! Biaxin 5)  ! Flagyl (Metronidazole)  Directives: 1)  Living Will   Past History:  Family History: Last updated: Mar 25, 2010 father died from rectal cancer and heart failure mother is 82 with dementia Family History of Celiac Disease: Daughter Family History of Heart Disease: Father Son has Barrett's esophagus  Social History: Last updated: March 25, 2010 Married Former Smoker Alcohol Use - yes-1 glass wine daily Daily Caffeine Use Illicit Drug Use -  no  Risk Factors: Alcohol Use: <1 (08/25/2010) Caffeine Use: 1 (12/04/2007) Exercise: no (12/04/2007)  Risk Factors: Smoking Status: never (09/14/2010) Packs/Day: 1-2 packs (08/25/2010) Passive Smoke Exposure: no (08/25/2010)  Past medical, surgical, family and social histories (including risk factors) reviewed, and no changes noted (except as noted below).  Past Medical History: Reviewed history from 12/04/2009 and no changes required. Hypertension Diverticulosis COLON POLYPS 10/10,TUBULAR ADENOMA AND ONE HYPERPLASTIC Collagenous colitis 2005 Hypertension  Past Surgical History: Reviewed history from 2010/03/25 and no changes required. Appendectomy Tonsillectomy Cataract extraction Moles removed Surgerical Removal of Morton's Neuroma in Right Foot Mouth surgery (for exostosis)  Family History: Reviewed history from 03/25/10 and no changes required. father died from rectal cancer and heart failure mother is 22 with dementia Family History of Celiac Disease: Daughter Family History of Heart Disease: Father Son has Barrett's esophagus  Social History: Reviewed history from 03/25/2010 and no changes required. Married Former Smoker Alcohol Use - yes-1 glass wine daily Daily Caffeine Use Illicit Drug Use - no Smoking Status:  never  Review of Systems  The patient denies anorexia, fever, weight loss, weight gain, vision loss, decreased hearing, hoarseness, chest pain, syncope, dyspnea on exertion, peripheral edema, prolonged cough, headaches, hemoptysis, abdominal pain, melena, hematochezia, severe indigestion/heartburn, hematuria, incontinence, genital sores, muscle weakness, suspicious skin lesions, transient blindness, difficulty walking, depression, unusual weight change, abnormal bleeding, enlarged lymph nodes, angioedema, and breast masses.    Physical Exam  General:  Well-developed,well-nourished,in no acute distress; alert,appropriate and cooperative  throughout examination Head:  Normocephalic and atraumatic without obvious abnormalities. No apparent alopecia or balding. Eyes:  pupils equal and pupils round.   Ears:  R ear normal and L ear normal.   Neck:  No deformities, masses, or tenderness noted. Lungs:  normal respiratory effort and no intercostal retractions.   Heart:  normal rate and regular rhythm.   Abdomen:  soft, non-tender, and normal bowel sounds.     Impression & Recommendations:  Problem # 1:  HYPERTENSION (ICD-401.9) the repported hypotension has resolved BP today: 140/70 Prior BP: 130/80 (08/25/2010)  Prior 10 Yr Risk Heart Disease: 7 % (07/14/2010)  Labs Reviewed: K+: 4.0 (07/14/2010) Creat: : 0.6 (07/14/2010)   Chol: 206 (01/07/2010)   HDL: 69.00 (01/07/2010)   LDL: 115 (07/24/2009)   TG: 106.0 (07/24/2009)  Problem # 2:  ACHILLES TENDON TEAR (ICD-845.09)  she is in the care of orthopedit with adequate pain control  Instructed to use a compression wrap, elevate the affected area, apply ICE for 20 minutes every hour while awake for next 3 days, and rest. Start physical therapy as directed and recheck in 10-14 days if no improvement, sooner if worse.  Problem # 3:  HYPONATREMIA (ICD-276.1) blood pressure has recovered suspect volume issue, monitering sodium  Orders: Venipuncture (91478) Specimen Handling (29562) TLB-BMP (Basic Metabolic Panel-BMET) (80048-METABOL)  Complete Medication List: 1)  Nadolol-bendroflumethiazide 40-5 Mg Tabs (Nadolol-bendroflumethiazide) .... One by mouth daily 2)  Vitamin D-1000 Max St [phone] Mg-unit Tabs (Calcium-vitamin d) .... Once daily 3)  Tums 500 Mg Chew (Calcium carbonate antacid) .... Three times a day 4)  Fish Oil 1000  Mg Caps (Omega-3 fatty acids) .... Pt states 10,000 once daily 5)  Ocuvite Tabs (Multiple vitamins-minerals) .... 2 once daily 6)  Azithromycin 250 Mg Tabs (Azithromycin) .... 2 by mouth today then one by mouth once daily for 4 days  Hypertension  Assessment/Plan:      The patient's hypertensive risk group is category B: At least one risk factor (excluding diabetes) with no target organ damage.  Her calculated 10 year risk of coronary heart disease is 9 %.  Today's blood pressure is 140/70.  Her blood pressure goal is < 140/90.  Patient Instructions: 1)  Please schedule a follow-up appointment in 2 months.

## 2011-01-25 NOTE — Assessment & Plan Note (Signed)
Summary: Gastroenterology  [facility]   MR#:  [id number] Page #  NAME:  Adriana Hendricks, Adriana Hendricks    OFFICE NO:  [id number]  DATE:  03/24/06  DOB:  18-Feb-2035  CHIEF COMPLAINT:  The patient is a 76 year old white female with collagenous colitis diagnosed on last colonoscopy in August 2005.  She has a history of a positive family history of colon cancer in her father.  Previous colonoscopy was in May 2002.  She also has diverticulosis of the left colon and benign essential tremor.  She was in remission after a short course of prednisone 2 years ago until about a month ago when she started having diarrhea again.  She denies nocturnal diarrhea, rectal bleeding or abdominal pain.  Her weight has been stable.  She has started eating yogurt, which seems to help.  MEDICATIONS:  Cardizem 140/5 p.o. q.d., Centrum Silver, Tums with calcium, vitamin D, aspirin 325 mg p.o. q.d.  PHYSICAL EXAMINATION:  VITAL SIGNS:  Blood pressure 154/70.  Pulse 74.  Weight 164 pounds.  GENERAL:  She was alert and oriented in no distress.  LUNGS:  Clear to auscultation.  COR:  With normal S1, normal S2.  ABDOMEN:  Soft.  Mildly protuberant.  Nontender with normoactive bowel sounds.  No abnormal rushes.  No distention.  No fullness.  RECTAL:  Exam with normal rectal tone.  Stool was soft and Hemoccult negative.  IMPRESSION:  A 76 year old white female with exacerbation of collagenous colitis.  Her symptoms are rather mild.  She responded last time to a short course of steroids but we both were reluctant to give her steroids at this time.  We will try broad-spectrum antibiotics and acidophilus tablets on a daily basis.  She should stay on a high-fiber diet.  PLAN:   1.  Cipro 250 p.o. b.i.d. 2.  A high-fiber diet. 3.  Acidophilus 1 tablet q.d. 4.  Next colonoscopy in August 2010.    Hedwig Morton. Juanda Chance, M.D.  [id number] cc:  Dr. Darryll Capers  D:  03/24/06; T:  ; Job [phone]

## 2011-01-25 NOTE — Assessment & Plan Note (Signed)
Summary: pt will come in fasting/njr rsc bmp/njr   Vital Signs:  Patient profile:   76 year old female Height:      66 inches Weight:      158 pounds BMI:     25.59 Temp:     98.2 degrees F oral Pulse rate:   76 / minute Resp:     14 per minute BP sitting:   130 / 78  (left arm)  Vitals Entered By: Willy Eddy, LPN (January 07, 2010 9:47 AM)  Nutrition Counseling: Patient's BMI is greater than 25 and therefore counseled on weight management options. CC: annuall visit for disease managment--had light breakfast Is Patient Diabetic? No Nutritional Status BMI of 25 - 29 = overweight Nutritional Status Detail poor diet  Does patient need assistance? Functional Status Self care, Cook/clean, Shopping, Social activities   Primary Care Provider:  Darryll Capers, MD   CC:  annuall visit for disease managment--had light breakfast.  History of Present Illness: Preventative care exam reveiwed the past medical hx and family hx and updated as indicated, documented function statis in assessment forms documented allergies, noted tobacoo and alocohol, depression screening  in  Preventative care screening form documented screening exams in [facility] form immunization record undated and need to shingles vaccine discussed sreening protocols and timing of screening discussed including  appropirate labs ( see orders) Pt has not had screening for AAA, pt desires screening and will order ( risk are lipids and HTN)   Preventive Screening-Counseling & Management  Alcohol-Tobacco     Alcohol drinks/day: <1     Smoking Status: quit     Packs/Day: 1-2 packs     Year Started: 1959     Year Quit: 1969     Passive Smoke Exposure: no  Caffeine-Diet-Exercise     [facility] Depression Score: no depressed  Problems Prior to Update: 1)  Personal Hx Colonic Polyps  (ICD-V12.72) 2)  Diarrhea  (ICD-787.91) 3)  Abdominal Pain -generalized  (ICD-789.07) 4)  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) 5)   Adverse Drug Reaction  (ICD-995.20) 6)  Family Hx Colon Cancer  (ICD-V16.0) 7)  Rectal Bleeding  (ICD-569.3) 8)  Diarrhea-presumed Infectious  (ICD-009.3) 9)  Gastroesophageal Reflux Disease  (ICD-530.81) 10)  Insect Bite, Upper Arm  (ICD-912.4) 11)  Biceps Tendinitis, Right  (ICD-726.12) 12)  Acne Rosacea  (ICD-695.3) 13)  Physical Examination  (ICD-V70.0) 14)  Collagenous Colitis  (ICD-710.9) 15)  Diverticulosis, Colon  (ICD-562.10) 16)  Retinal Detachment, 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) 17)  Morton's Neuroma, 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 One By Mouth Daily 2)  Turmeric Curcumin   Caps (Misc Natural Products) .... Once Daily 3)  Vitamin D-1000 Max St [phone] Mg-Unit  Tabs (Calcium-Vitamin D) .... Once Daily 4)  Tums 500 Mg  Chew (Calcium Carbonate Antacid) .... Three Times A Day 5)  Refresh P.m.  Oint (Artificial Tear Ointment) .... Use As Directed 6)  Restasis 0.05 % Emul (Cyclosporine) .... Use As Directed 7)  Bentyl 10 Mg Caps (Dicyclomine Hcl) .Marland Kitchen.. 1 By Mouth 20 Minutes Before Meals and At Bedtime.  Current Medications (verified): 1)  Nadolol-Bendroflumethiazide 40-5 Mg  Tabs (Nadolol-Bendroflumethiazide) .... One By Mouth Daily 2)  Turmeric Curcumin   Caps (Misc Natural Products) .... Once Daily 3)  Vitamin D-1000 Max St [phone] Mg-Unit  Tabs (Calcium-Vitamin D) .... Once Daily 4)  Tums 500 Mg  Chew (Calcium Carbonate Antacid) .... Three Times A Day 5)  Refresh P.m.  Oint (Artificial Tear Ointment) .... Use As Directed 6)  Restasis 0.05 % Emul (Cyclosporine) .... Use As Directed 7)  Bentyl 10 Mg Caps (Dicyclomine Hcl) .Marland Kitchen.. 1 By Mouth 20 Minutes Before Meals and At Bedtime.  Allergies (verified): 1)  ! Penicillin G Procaine (Penicillin G Procaine) 2)  ! Ery-Tab (Erythromycin Base) 3)  ! Adhesive Paper (Adhesive  Tape) 4)  ! Biaxin 5)  ! Flagyl (Metronidazole)  Directives (verified): 1)  Living Will   Past History:  Family History: Last updated: 2008/01/02 father died from rectal cancer and heart failure mother is 32 with dementia  Social History: Last updated: 07/03/2009 Married Former Smoker Alcohol Use - yes Daily Caffeine Use Illicit Drug Use - no  Risk Factors: Alcohol Use: <1 (01/07/2010) Caffeine Use: 1 (2008-01-02) Exercise: no (2008/01/02)  Risk Factors: Smoking Status: quit (01/07/2010) Packs/Day: 1-2 packs (01/07/2010) Passive Smoke Exposure: no (01/07/2010)  Past medical, surgical, family and social histories (including risk factors) reviewed, and no changes noted (except as noted below).  Past Medical History: Reviewed history from 12/04/2009 and no changes required. Hypertension Diverticulosis COLON POLYPS 10/10,TUBULAR ADENOMA AND ONE HYPERPLASTIC Collagenous colitis 2005 Hypertension  Past Surgical History: Reviewed history from 07/03/2009 and no changes required. Appendectomy Tonsillectomy Cataract extraction Moles removed  Family History: Reviewed history from 01/02/08 and no changes required. father died from rectal cancer and heart failure mother is 44 with dementia  Social History: Reviewed history from 07/03/2009 and no changes required. Married Former Smoker Alcohol Use - yes Daily Caffeine Use Illicit Drug Use - no  Review of Systems       Flu Vaccine Consent Questions     Do you have a history of severe allergic reactions to this vaccine? no    Any prior history of allergic reactions to egg and/or gelatin? no    Do you have a sensitivity to the preservative Thimersol? no    Do you have a past history of Guillan-Barre Syndrome? no    Do you currently have an acute febrile illness? no    Have you ever had a severe reaction to latex? no    Vaccine information given and explained to patient? yes    Are you currently pregnant? no     Lot Number:[id number]   Exp Date:06/24/2010   Site Given  Left Deltoid IM    Impression & Recommendations:  Problem # 1:  PREVENTIVE HEALTH CARE (ICD-V70.0) Assessment Improved  due shingles vaccine and  come back in 30 dasy had flu shot today Mammogram: normal (09/24/2009) Pap smear: normal (09/24/2009) Colonoscopy: Location:  [facility].   (09/29/2009) Bone Density: normal (09/17/2009) Td Booster: Td (12/26/2001)   Flu Vax: Fluvax 3+ (01/07/2010)   Pneumovax: Pneumovax (12/26/2005) Chol: 191 (07/24/2009)   HDL: 54.50 (07/24/2009)   LDL: 115 (07/24/2009)   TG: 106.0 (07/24/2009) TSH: 2.44 (12/04/2008)   Next mammogram due:: 09/2010 (01/07/2010) Next Colonoscopy due:: 09/2014 (09/29/2009) Next Bone Density due:: 09/2011 (01/07/2010)  Discussed using sunscreen, use of alcohol, drug use, self breast exam, routine dental care, routine eye care, schedule for GYN exam, routine physical exam, seat belts, multiple vitamins, osteoporosis prevention, adequate calcium intake in diet, recommendations for immunizations, mammograms and Pap smears.  Discussed exercise and checking cholesterol.  Discussed gun safety, safe sex, and contraception.  Orders: EMR Misc Charge Code [facility])  Complete Medication List: 1)  Nadolol-bendroflumethiazide 40-5 Mg Tabs (Nadolol-bendroflumethiazide) .Marland KitchenMarland KitchenMarland Kitchen  One by mouth daily 2)  Turmeric Curcumin Caps (Misc natural products) .... Once daily 3)  Vitamin D-1000 Max St [phone] Mg-unit Tabs (Calcium-vitamin d) .... Once daily 4)  Tums 500 Mg Chew (Calcium carbonate antacid) .... Three times a day 5)  Refresh P.m. Oint (Artificial tear ointment) .... Use as directed 6)  Restasis 0.05 % Emul (Cyclosporine) .... Use as directed 7)  Bentyl 10 Mg Caps (Dicyclomine hcl) .Marland Kitchen.. 1 by mouth 20 minutes before meals and at bedtime.  Other Orders: Admin 1st Vaccine (16109) Flu Vaccine 89yrs + [phone])   Preventive Care Screening  Bone Density:    Date:   09/17/2009    Next Due:  09/2011    Results:  normal std dev  Mammogram:    Date:  09/24/2009    Next Due:  09/2010    Results:  normal   Pap Smear:    Date:  09/24/2009    Next Due:  09/2010    Results:  normal   Last Flu Shot:    Date:  01/07/2010    Results:  Fluvax 3+   Prevention & Chronic Care Immunizations   Influenza vaccine: Fluvax 3+  (01/07/2010)   Influenza vaccine due: 08/26/2010    Tetanus booster: 12/26/2001: Td   Tetanus booster due: 12/27/2011    Pneumococcal vaccine: Pneumovax  (12/26/2005)   Pneumococcal vaccine due: 12/26/2010    H. zoster vaccine: Not documented  Colorectal Screening   Hemoccult: Not documented    [facility]    (09/29/2009)   Colonoscopy due: 09/2014  Other Screening   Pap smear: normal  (09/24/2009)   Pap smear due: 09/2010    Mammogram: normal  (09/24/2009)   Mammogram due: 09/2010    DXA bone density scan: normal  (09/17/2009)   DXA scan due: 09/2011    Smoking status: quit  (01/07/2010)  Lipids   Total Cholesterol: 191  (07/24/2009)   LDL: 115  (07/24/2009)   LDL Direct: 126.8  (11/20/2007)   HDL: 54.50  (07/24/2009)   Triglycerides: 106.0  (07/24/2009)  Hypertension   Last Blood Pressure: 130 / 78  (01/07/2010)   Serum creatinine: 0.6  (12/04/2008)   Serum potassium 3.9  (12/04/2008)    Hypertension flowsheet reviewed?: Yes   Progress toward BP goal: At goal  Self-Management Support :    Patient will work on the following items until the next clinic visit to reach self-care goals:     Medications and monitoring: take my medicines every day, check my blood pressure, bring all of my medications to every visit, weigh myself weekly  (01/07/2010)     Eating: eat more vegetables  (01/07/2010)     Activity: take a 30 minute walk every day, take the stairs instead of the elevator  (01/07/2010)    Hypertension self-management support: BP self-monitoring log, Written  self-care plan, Resources for patients handout  (01/07/2010)   Hypertension self-care plan printed.      Resource handout printed.   Appended Document: pt will come in fasting/njr rsc bmp/njr     Vital Signs:  Patient profile:   76 year old female Weight:      158 pounds BMI:     25.59 Temp:     98.2 degrees F BP sitting:   130 / 78  Primary Care Provider:  Darryll Capers, MD    History of Present Illness: follow up for GERD and HTN ans well as moerate dylipedemia   Hypertension Follow-Up  This is a 76 year old woman who presents for Hypertension follow-up.  The patient denies lightheadedness, urinary frequency, headaches, edema, impotence, rash, and fatigue.  The patient denies the following associated symptoms: chest pain, chest pressure, exercise intolerance, dyspnea, palpitations, syncope, leg edema, and pedal edema.  Compliance with medications (by patient report) has been near 100%.  The patient reports that dietary compliance has been good.  The patient reports exercising occasionally.    Hyperlipidemia Follow-Up      The patient also presents for Hyperlipidemia follow-up.  The patient denies muscle aches, GI upset, abdominal pain, flushing, itching, constipation, diarrhea, and fatigue.  The patient denies the following symptoms: chest pain/pressure, exercise intolerance, dypsnea, palpitations, syncope, and pedal edema.  Dietary compliance has been good.  The patient reports exercising occasionally.  Adjunctive measures currently used by the patient include fish oil supplements and weight reduction.    Allergies: 1)  ! Penicillin G Procaine (Penicillin G Procaine) 2)  ! Ery-Tab (Erythromycin Base) 3)  ! Adhesive Paper (Adhesive Tape) 4)  ! Biaxin 5)  ! Flagyl (Metronidazole)  Review of Systems  The patient denies anorexia, fever, weight loss, weight gain, vision loss, decreased hearing, hoarseness, chest pain, syncope, dyspnea on exertion, peripheral edema, prolonged  cough, headaches, hemoptysis, abdominal pain, melena, hematochezia, severe indigestion/heartburn, hematuria, incontinence, genital sores, muscle weakness, suspicious skin lesions, transient blindness, difficulty walking, depression, unusual weight change, abnormal bleeding, enlarged lymph nodes, angioedema, breast masses, and testicular masses.    Physical Exam  General:  Well-developed,well-nourished,in no acute distress; alert,appropriate and cooperative throughout examination Head:  Normocephalic and atraumatic without obvious abnormalities. No apparent alopecia or balding. Eyes:  conjunctival injection.   Nose:  no external deformity.   Mouth:  pharynx pink and moist and no erythema.   Neck:  No deformities, masses, or tenderness noted. Lungs:  Normal respiratory effort, chest expands symmetrically. Lungs are clear to auscultation, no crackles or wheezes. Heart:  Normal rate and regular rhythm. S1 and S2 normal without gallop, murmur, click, rub or other extra sounds. Abdomen:  Bowel sounds positive,abdomen soft and non-tender without masses, organomegaly or hernias noted. Msk:  No deformity or scoliosis noted of thoracic or lumbar spine.   Extremities:  No clubbing, cyanosis, edema, or deformity noted with normal full range of motion of all joints.   Neurologic:  alert & oriented X3 and DTRs symmetrical and normal.     Impression & Recommendations:  Problem # 1:  GASTROESOPHAGEAL REFLUX DISEASE (ICD-530.81) Assessment Deteriorated  increased solid phase dysphagia, trial of PPI if fails,  egd The following medications were removed from the medication list:    Bentyl 10 Mg Caps (Dicyclomine hcl) .Marland Kitchen... 1 by mouth 20 minutes before meals and at bedtime. Her updated medication list for this problem includes:    Tums 500 Mg Chew (Calcium carbonate antacid) .Marland Kitchen... Three times a day    Prilosec 20 Mg Cpdr (Omeprazole) ..... One by mouth daily as a trila for the swallowing  Labs  Reviewed: Hgb: 14.4 (12/04/2009)   Hct: 41.8 (12/04/2009)  Problem # 2:  HYPERTENSION (ICD-401.9) Assessment: Unchanged  Her updated medication list for this problem includes:    Nadolol-bendroflumethiazide 40-5 Mg Tabs (Nadolol-bendroflumethiazide) ..... One by mouth daily  BP today: 130/78 Prior BP: 130/78 (01/07/2010)  Prior 10 Yr Risk Heart Disease: 9 % (11/02/2009)  Labs Reviewed: K+: 3.9 (12/04/2008) Creat: : 0.6 (12/04/2008)   Chol: 191 (07/24/2009)   HDL: 54.50 (07/24/2009)   LDL: 115 (07/24/2009)   TG:  106.0 (07/24/2009)  Orders: TLB-BMP (Basic Metabolic Panel-BMET) (80048-METABOL)  Problem # 3:  HYPERLIPIDEMIA, MILD (ICD-272.4) Assessment: Unchanged  MONITER TODAY AND ADD FISH OIKL SUPPLIMENTS  Orders: Venipuncture (94854) TLB-TSH (Thyroid Stimulating Hormone) (84443-TSH) TLB-Cholesterol, HDL (83718-HDL) TLB-Cholesterol, Direct LDL (83721-DIRLDL) TLB-Cholesterol, Total (82465-CHO)  Complete Medication List: 1)  Nadolol-bendroflumethiazide 40-5 Mg Tabs (Nadolol-bendroflumethiazide) .... One by mouth daily 2)  Turmeric Curcumin Caps (Misc natural products) .... Once daily 3)  Vitamin D-1000 Max St [phone] Mg-unit Tabs (Calcium-vitamin d) .... Once daily 4)  Tums 500 Mg Chew (Calcium carbonate antacid) .... Three times a day 5)  Refresh P.m. Oint (Artificial tear ointment) .... Use as directed 6)  Restasis 0.05 % Emul (Cyclosporine) .... Use as directed 7)  Fish Oil 1000 Mg Caps (Omega-3 fatty acids) .... One by mouth two times a day 8)  Prilosec 20 Mg Cpdr (Omeprazole) .... One by mouth daily as a trila for the swallowing  Patient Instructions: 1)  Please schedule a follow-up appointment in 1 month.

## 2011-01-27 NOTE — Letter (Signed)
Summary: MEDICARE QUESTIONNAIRE  PATIENT HX FORM   Imported By: Georgian Co 01/17/2011 11:43:34  _____________________________________________________________________  External Attachment:    Type:   Image     Comment:   External Document

## 2011-01-27 NOTE — Letter (Signed)
Summary: Breast & Pap Exam  Breast & Pap Exam   Imported By: Georgian Co 12/06/2010 09:17:02  _____________________________________________________________________  External Attachment:    Type:   Image     Comment:   External Document

## 2011-02-02 NOTE — Letter (Signed)
Summary: Mammogram Results Letter  Mammogram Results Letter   Imported By: Maryln Gottron 01/25/2011 14:58:53  _____________________________________________________________________  External Attachment:    Type:   Image     Comment:   External Document

## 2011-02-10 NOTE — Assessment & Plan Note (Signed)
Summary: pt will come in fasting/njr   Vital Signs:  Patient profile:   76 year old female Height:      66 inches Weight:      164 pounds BMI:     26.57 Temp:     98.2 degrees F oral Pulse rate:   72 / minute Resp:     14 per minute BP sitting:   120 / 76  (left arm)  Vitals Entered By: Willy Eddy, LPN (January 17, 2011 8:52 AM) CC: annual visit for disease management-fasting this am, Hypertension Management, Lipid Management Is Patient Diabetic? No   Primary Care Provider:  Darryll Capers, MD   CC:  annual visit for disease management-fasting this am, Hypertension Management, and Lipid Management.  History of Present Illness: /I have personally reviewed the Medicare Annual Wellness questionnaire and have noted 1.   The patient's medical and social history 2.   Their use of alcohol, tobacco or illicit drugs 3.   Their current medications and supplements 4.   The patient's functional ability including ADL's, fall risks, home safety risks and hearing or visual             impairment. 5.   Diet and physical activities 6.   Evidence for depression or mood disorders The patients weight, height, BMI and visual acuity have been recorded in the chart I have made referrals, counseling and provided education to the patient based review of the above and I have provided the pt with a written personalized care plan for preventive services.  follow up HTN and lipids benign essential tremor stable   Hypertension History:      She denies headache, chest pain, palpitations, dyspnea with exertion, orthopnea, PND, peripheral edema, visual symptoms, neurologic problems, syncope, and side effects from treatment.        Positive major cardiovascular risk factors include female age 56 years old or older, hyperlipidemia, and hypertension.  Negative major cardiovascular risk factors include no history of diabetes, negative family history for ischemic heart disease, and non-tobacco-user status.          Further assessment for target organ damage reveals no history of stroke/TIA or peripheral vascular disease.    Lipid Management History:      Positive NCEP/ATP III risk factors include female age 18 years old or older and hypertension.  Negative NCEP/ATP III risk factors include non-diabetic, HDL cholesterol greater than 60, no family history for ischemic heart disease, non-tobacco-user status, no prior stroke/TIA, no peripheral vascular disease, and no history of aortic aneurysm.      Preventive Screening-Counseling & Management  Alcohol-Tobacco     Alcohol drinks/day: <1     Smoking Status: never     Packs/Day: 1-2 packs     Year Started: 1959     Year Quit: 1969     Passive Smoke Exposure: no     Tobacco Counseling: not indicated; no tobacco use  Problems Prior to Update: 1)  Hx of Achilles Tendon Tear  (ICD-845.09) 2)  Dysphagia  (ICD-787.29) 3)  Fh of Colon Cancer  (ICD-153.9) 4)  Hyponatremia  (ICD-276.1) 5)  Hyperlipidemia, Mild  (ICD-272.4) 6)  Preventive Health Care  (ICD-V70.0) 7)  Colonic Polyps, Adenomatous, Hx of  (ICD-V12.72) 8)  Adverse Drug Reaction  (ICD-995.20) 9)  Family Hx Colon Cancer  (ICD-V16.0) 10)  Rectal Bleeding  (ICD-569.3) 11)  Gastroesophageal Reflux Disease  (ICD-530.81) 12)  Acne Rosacea  (ICD-695.3) 13)  Physical Examination  (ICD-V70.0) 14)  Collagenous Colitis  (  ICD-710.9) 15)  Diverticulosis, Colon  (ICD-562.10) 16)  Retinal Detachment, 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) 17)  Morton's Neuroma, 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) 18)  Hypertension  (ICD-401.9) 19)  Tremor, Essential  (ICD-333.1)  Current Problems (verified): 1)  Achilles Tendon Tear  (ICD-845.09) 2)  Heel Pain, Left  (ICD-729.5) 3)  Dysphagia  (ICD-787.29) 4)  Fh of Colon Cancer  (ICD-153.9) 5)  Hyponatremia  (ICD-276.1) 6)  Hyperlipidemia, Mild  (ICD-272.4) 7)  Preventive Health Care  (ICD-V70.0) 8)  Colonic Polyps, Adenomatous, Hx of  (ICD-V12.72) 9)  Diarrhea  (ICD-787.91) 10)  Abdominal  Pain -generalized  (ICD-789.07) 11)  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 One By Mouth Daily 2)  Vitamin D-1000 Max St [phone] Mg-Unit  Tabs (Calcium-Vitamin D) .... Once Daily 3)  Fish Oil 1000 Mg Caps (Omega-3 Fatty Acids) .... Pt States 10,000 Once Daily 4)  Ocuvite  Tabs (Multiple Vitamins-Minerals) .... 2 Once Daily  Current Medications (verified): 1)  Nadolol-Bendroflumethiazide 40-5 Mg  Tabs (Nadolol-Bendroflumethiazide) .... One By Mouth Daily 2)  Vitamin D-1000 Max St [phone] Mg-Unit  Tabs (Calcium-Vitamin D) .... Once Daily 3)  Fish Oil 1000 Mg Caps (Omega-3 Fatty Acids) .... Pt States 10,000 Once Daily 4)  Ocuvite  Tabs (Multiple Vitamins-Minerals) .... 2 Once Daily  Allergies (verified): 1)  ! Penicillin G Procaine (Penicillin G Procaine) 2)  ! Ery-Tab (Erythromycin Base) 3)  ! Adhesive Paper (Adhesive Tape) 4)  ! Biaxin 5)  ! Flagyl (Metronidazole)  Directives: 1)  Living Will   Past History:  Family History: Last updated: 04/05/10 father died from rectal cancer and heart failure mother is 45 with dementia Family History of Celiac Disease: Daughter Family History of Heart Disease: Father Son has Barrett's esophagus  Social History: Last  updated: Apr 05, 2010 Married Former Smoker Alcohol Use - yes-1 glass wine daily Daily Caffeine Use Illicit Drug Use - no  Risk Factors: Alcohol Use: <1 (01/17/2011) Caffeine Use: 1 (12/04/2007) Exercise: no (12/04/2007)  Risk Factors: Smoking Status: never (01/17/2011) Packs/Day: 1-2 packs (01/17/2011) Passive Smoke Exposure: no (01/17/2011)  Past medical, surgical, family and social histories (including risk factors) reviewed, and no changes noted (except as noted below).  Past Medical History: Reviewed history from 12/04/2009 and no changes required. Hypertension Diverticulosis COLON POLYPS 10/10,TUBULAR ADENOMA AND ONE HYPERPLASTIC Collagenous colitis 2005 Hypertension  Past Surgical History: Reviewed history from 04-05-10 and no changes required. Appendectomy Tonsillectomy Cataract extraction Moles removed Surgerical Removal of Morton's Neuroma in Right Foot Mouth surgery (for exostosis)  Family History: Reviewed history from Apr 05, 2010 and no changes required. father died from rectal cancer and heart failure mother is 63 with dementia Family History of Celiac Disease: Daughter Family History of Heart Disease: Father Son has Barrett's esophagus  Social History: Reviewed history from 04/05/10 and no changes required. Married Former Smoker Alcohol Use - yes-1 glass  wine daily Daily Caffeine Use Illicit Drug Use - no  Review of Systems       The patient complains of vision loss.  The patient denies anorexia, fever, weight loss, weight gain, decreased hearing, hoarseness, chest pain, syncope, dyspnea on exertion, peripheral edema, prolonged cough, headaches, hemoptysis, abdominal pain, melena, hematochezia, severe indigestion/heartburn, hematuria, incontinence, genital sores, muscle weakness, suspicious skin lesions, transient blindness, difficulty walking, depression, unusual weight change, abnormal bleeding, enlarged lymph nodes, angioedema, and breast  masses.    Physical Exam  General:  Well-developed,well-nourished,in no acute distress; alert,appropriate and cooperative throughout examination Head:  Normocephalic and atraumatic without obvious abnormalities. No apparent alopecia or balding. Eyes:  pupils equal and pupils round.   Ears:  R ear normal and L ear normal.   Nose:  no external deformity and no nasal discharge.   Mouth:  pharynx pink and moist and no erythema.   Neck:  No deformities, masses, or tenderness noted. Lungs:  normal respiratory effort and no intercostal retractions.   Heart:  normal rate and regular rhythm.   Abdomen:  soft, non-tender, and normal bowel sounds.   Msk:  no redness over joints, no joint deformities, joint tenderness, and joint swelling.   tnedon sheeth is tender with stretching ( flexion of the ankle) Pulses:  R and L carotid,radial,femoral,dorsalis pedis and posterior tibial pulses are full and equal bilaterally Extremities:  trace left pedal edema and trace right pedal edema.   Neurologic:  strength normal in all extremities and sensation intact to light touch.     Impression & Recommendations:  Problem # 1:  PREVENTIVE HEALTH CARE (ICD-V70.0) Assessment Unchanged  The pt was asked about all immunizations, health maint. services that are appropriate to their age and was given guidance on diet exercize  and weight management  Mammogram: abnormal (12/24/2010) Pap smear: normal (12/24/2010) Colonoscopy: Location:  [facility].   (09/29/2009) Bone Density: normal (09/17/2009) Td Booster: Td (12/26/2001)   Flu Vax: Fluvax 3+ (10/08/2010)   Pneumovax: Pneumovax (12/26/2005) Chol: 206 (01/07/2010)   HDL: 69.00 (01/07/2010)   LDL: 115 (07/24/2009)   TG: 106.0 (07/24/2009) TSH: 1.65 (01/07/2010)   Next mammogram due:: 12/2011 (01/17/2011) Next Colonoscopy due:: 09/2014 (09/29/2009) Next Bone Density due:: 09/2011 (01/07/2010)  Discussed using sunscreen, use of alcohol, drug use,  self breast exam, routine dental care, routine eye care, schedule for GYN exam, routine physical exam, seat belts, multiple vitamins, osteoporosis prevention, adequate calcium intake in diet, recommendations for immunizations, mammograms and Pap smears.  Discussed exercise and checking cholesterol.  Discussed gun safety, safe sex, and contraception.  Orders: Medicare -1st Annual Wellness Visit [phone]) Specimen Handling (93235)  Problem # 2:  HYPERLIPIDEMIA, MILD (ICD-272.4) Assessment: Unchanged  Orders: Specimen Handling (57322) TLB-Lipid Panel (80061-LIPID)  Labs Reviewed: SGOT: 22 (07/24/2009)   SGPT: 19 (07/24/2009)  Lipid Goals: Chol Goal: 200 (01/17/2011)   HDL Goal: 40 (01/17/2011)   LDL Goal: 160 (01/17/2011)   TG Goal: 150 (01/17/2011)  10 Yr Risk Heart Disease: 7 % Prior 10 Yr Risk Heart Disease: 9 % (09/14/2010)   HDL:69.00 (01/07/2010), 54.50 (07/24/2009)  LDL:115 (07/24/2009), 121 (12/04/2008)  Chol:206 (01/07/2010), 191 (07/24/2009)  Trig:106.0 (07/24/2009), 57 (12/04/2008)  Problem # 3:  HYPONATREMIA (ICD-276.1) Assessment: Unchanged moniter sodium today  Problem # 4:  GASTROESOPHAGEAL REFLUX DISEASE (ICD-530.81) Assessment: Improved  EGD: DONE (04/20/2010)  Labs Reviewed: Hgb: 14.4 (12/04/2009)   Hct: 41.8 (12/04/2009)  Orders: Venipuncture (02542) TLB-CBC Platelet - w/Differential (85025-CBCD)  Problem # 5:  HYPERTENSION (ICD-401.9)  Assessment: Improved  Her updated medication list for this problem includes:    Nadolol-bendroflumethiazide 40-5 Mg Tabs (Nadolol-bendroflumethiazide) ..... One by mouth daily  BP today: 120/76 Prior BP: 140/78 (11/22/2010)  10 Yr Risk Heart Disease: 7 % Prior 10 Yr Risk Heart Disease: 9 % (09/14/2010)  Labs Reviewed: K+: 3.8 (11/22/2010) Creat: : 0.7 (11/22/2010)   Chol: 206 (01/07/2010)   HDL: 69.00 (01/07/2010)   LDL: 115 (07/24/2009)   TG: 106.0 (07/24/2009)  Orders: UA Dipstick w/o Micro (automated)   (81003) Specimen Handling (82956)  Complete Medication List: 1)  Nadolol-bendroflumethiazide 40-5 Mg Tabs (Nadolol-bendroflumethiazide) .... One by mouth daily 2)  Vitamin D-1000 Max St [phone] Mg-unit Tabs (Calcium-vitamin d) .... Once daily 3)  Fish Oil 1000 Mg Caps (Omega-3 fatty acids) .... Pt states 10,000 once daily 4)  Ocuvite Tabs (Multiple vitamins-minerals) .... 2 once daily  Other Orders: TLB-CMP (Comprehensive Metabolic Pnl) (80053-COMP)  Hypertension Assessment/Plan:      The patient's hypertensive risk group is category B: At least one risk factor (excluding diabetes) with no target organ damage.  Her calculated 10 year risk of coronary heart disease is 7 %.  Today's blood pressure is 120/76.  Her blood pressure goal is < 140/90.  Lipid Assessment/Plan:      Based on NCEP/ATP III, the patient's risk factor category is "0-1 risk factors".  The patient's lipid goals are as follows: Total cholesterol goal is 200; LDL cholesterol goal is 160; HDL cholesterol goal is 40; Triglyceride goal is 150.    Patient Instructions: 1)  Please schedule a follow-up appointment in 6 months. 2)  I have provided you with a copy of your personalized plan for preventive services. Please take the time to review along with your updated medication list. Prescriptions: NADOLOL-BENDROFLUMETHIAZIDE 40-5 MG  TABS (NADOLOL-BENDROFLUMETHIAZIDE) one by mouth daily  #90 x 3   Entered by:   Willy Eddy, LPN   Authorized by:   Stacie Glaze MD   Signed by:   Willy Eddy, LPN on 21/30/8657   Method used:   Print then Give to Patient   RxID:   [id number]    Orders Added: 1)  Venipuncture [01027] 2)  UA Dipstick w/o Micro (automated)  [81003] 3)  Medicare -1st Annual Wellness Visit [G0438] 4)  Est. Patient Level III [25366] 5)  Specimen Handling [99000] 6)  TLB-Lipid Panel [80061-LIPID] 7)  TLB-CMP (Comprehensive Metabolic Pnl) [80053-COMP] 8)  TLB-CBC Platelet - w/Differential  [85025-CBCD]     Preventive Care Screening  Mammogram:    Date:  12/24/2010    Next Due:  12/2011    Results:  abnormal   Pap Smear:    Date:  12/24/2010    Next Due:  12/2013    Results:  normal   Prevention & Chronic Care Immunizations   Influenza vaccine: Fluvax 3+  (10/08/2010)   Influenza vaccine due: 08/26/2012    Tetanus booster: 12/26/2001: Td   Tetanus booster due: 12/27/2011    Pneumococcal vaccine: Pneumovax  (12/26/2005)   Pneumococcal vaccine deferral: Not indicated  (01/17/2011)   Pneumococcal vaccine due: 12/26/2010    H. zoster vaccine: 02/08/2010: Zostavax  Colorectal Screening   Hemoccult: Not documented    Colonoscopy: Location:   [facility].    (09/29/2009)   Colonoscopy due: 09/2014  Other Screening   Pap smear: normal  (12/24/2010)   Pap smear due: 12/2013    Mammogram: abnormal  (12/24/2010)   Mammogram due: 12/2011    DXA bone density  scan: normal  (09/17/2009)   DXA scan due: 09/2011    Smoking status: never  (01/17/2011)  Lipids   Total Cholesterol: 206  (01/07/2010)   Lipid panel action/deferral: Lipid Panel ordered   LDL: 115  (07/24/2009)   LDL Direct: 119.4  (01/07/2010)   HDL: 69.00  (01/07/2010)   Triglycerides: 106.0  (07/24/2009)    SGOT (AST): 22  (07/24/2009)   BMP action: Ordered   SGPT (ALT): 19  (07/24/2009) CMP ordered    Alkaline phosphatase: 49  (07/24/2009)   Total bilirubin: 0.7  (07/24/2009)    Lipid flowsheet reviewed?: Yes   Progress toward LDL goal: Improved  Hypertension   Last Blood Pressure: 120 / 76  (01/17/2011)   Serum creatinine: 0.7  (11/22/2010)   BMP action: Ordered   Serum potassium 3.8  (11/22/2010) CMP ordered     Hypertension flowsheet reviewed?: Yes   Progress toward BP goal: At goal  Self-Management Support :    Patient will work on the following items until the next clinic visit to reach self-care goals:     Medications and monitoring: take my medicines  every day, bring all of my medications to every visit  (01/17/2011)     Eating: eat more vegetables, use fresh or frozen vegetables, eat foods that are low in salt  (01/17/2011)     Activity: take a 30 minute walk every day, join a walking program  (01/17/2011)    Hypertension self-management support: BP self-monitoring log  (01/17/2011)    Lipid self-management support: Lipid monitoring log  (01/17/2011)    Laboratory Results   Urine Tests    Routine Urinalysis   Color: yellow Appearance: Clear Glucose: negative   (Normal Range: Negative) Bilirubin: negative   (Normal Range: Negative) Ketone: negative   (Normal Range: Negative) Spec. Gravity: 1.010   (Normal Range: 1.003-1.035) Blood: negative   (Normal Range: Negative) pH: 7.0   (Normal Range: 5.0-8.0) Protein: negative   (Normal Range: Negative) Urobilinogen: 0.2   (Normal Range: 0-1) Nitrite: negative   (Normal Range: Negative) Leukocyte Esterace: negative   (Normal Range: Negative)    Comments: Rita Ohara  January 17, 2011 3:27 PM

## 2011-04-21 ENCOUNTER — Encounter: Payer: Self-pay | Admitting: Internal Medicine

## 2011-04-22 ENCOUNTER — Ambulatory Visit (INDEPENDENT_AMBULATORY_CARE_PROVIDER_SITE_OTHER): Payer: Medicare Other | Admitting: Family Medicine

## 2011-04-22 ENCOUNTER — Encounter: Payer: Self-pay | Admitting: Family Medicine

## 2011-04-22 VITALS — BP 114/72 | Temp 98.7°F | Wt 170.0 lb

## 2011-04-22 DIAGNOSIS — J019 Acute sinusitis, unspecified: Secondary | ICD-10-CM

## 2011-04-22 MED ORDER — LEVOFLOXACIN 500 MG PO TABS: 500.0000 mg | ORAL_TABLET | Freq: Every day | ORAL | Status: DC

## 2011-04-22 NOTE — Progress Notes (Signed)
  Subjective:    Patient ID: Adriana Hendricks, female    DOB: 22-Jul-1935, 76 y.o.   MRN: [id number]  HPI Patient seen with over 2 week history of cough and facial pain with greenish nasal discharge. Started with URI type symptoms typical of cold over 2 weeks ago while on a cruise. She feels is getting worse instead of better. Cough productive of green sputum for several days. Occasional headaches. No fever. Denies nausea or vomiting. No diarrhea. She is a nonsmoker.   Review of Systems  Constitutional: Positive for fatigue. Negative for fever and chills.  HENT: Positive for congestion and sinus pressure. Negative for sore throat.   Respiratory: Positive for cough. Negative for shortness of breath.   Cardiovascular: Negative for chest pain, palpitations and leg swelling.  Neurological: Positive for headaches. Negative for syncope.       Objective:   Physical Exam  Constitutional: She is oriented to person, place, and time. She appears well-developed and well-nourished.  HENT:  Right Ear: External ear normal.  Left Ear: External ear normal.  Mouth/Throat: Oropharynx is clear and moist. No oropharyngeal exudate.  Cardiovascular: Normal rate, regular rhythm and normal heart sounds.   Pulmonary/Chest: Effort normal and breath sounds normal. No respiratory distress. She has no wheezes.  Musculoskeletal: She exhibits no edema.  Neurological: She is alert and oriented to person, place, and time.          Assessment & Plan:   Acute sinusitis. She has multiple antibiotic allergies. Intolerance to macrolides and allergy to penicillin. Start Levaquin 500 mg daily for 10 days.

## 2011-04-22 NOTE — Patient Instructions (Signed)

## 2011-04-24 ENCOUNTER — Encounter: Payer: Self-pay | Admitting: Family Medicine

## 2011-04-27 ENCOUNTER — Telehealth: Payer: Self-pay | Admitting: *Deleted

## 2011-04-27 MED ORDER — AZITHROMYCIN 250 MG PO TABS: ORAL_TABLET | ORAL | Status: DC

## 2011-04-27 NOTE — Telephone Encounter (Signed)
Pt called back again to check on status of previously call re: Levaquin. Pt is needing to know whether or not she needs to stop taking the Levaquin or not? Or does pt need a diff med? Pls call pt today and let her know.

## 2011-04-27 NOTE — Telephone Encounter (Addendum)
Pt is having soreness and tenderness in her achilles tendon.   Last year she had a tear of this tendon.  Is concerned about the Levaquin, and needs Dr. Lucie Leather advice.

## 2011-04-27 NOTE — Telephone Encounter (Signed)
Pt informed, will stop Levaquin (has not take today) and will begin Z-pack

## 2011-04-27 NOTE — Telephone Encounter (Signed)
She should stop the Levaquin if having soreness in her achilles.  I was not aware of prior achilles hx-she did not mention at  Office visit.  She has multiple drug allergies which limits.  Let's try switching her to Zithromax (Z-max).  She has prior intolerance mentioned to Biaxin but this was nausea and not true allergic reaction.

## 2011-04-28 ENCOUNTER — Other Ambulatory Visit: Payer: Self-pay | Admitting: *Deleted

## 2011-04-28 ENCOUNTER — Telehealth: Payer: Self-pay | Admitting: *Deleted

## 2011-04-28 MED ORDER — AZITHROMYCIN 250 MG PO TABS: ORAL_TABLET | ORAL | Status: AC

## 2011-04-28 NOTE — Telephone Encounter (Signed)
I could not get the Rx to send electronically, kept printing even on "normal " mode.  I called Rx in and also faxed to [phone], confirmation received..  Pt informed of error and apologized.

## 2011-04-28 NOTE — Telephone Encounter (Signed)
[facility] Triage Call Report Triage Record Num: [id number] Operator: Martie Lee Long Patient Name: Adriana Hendricks Call Date & Time: 04/27/2011 8:04:11PM Patient Phone: [phone] PCP: Evelena Peat Patient Gender: Female PCP Fax : [phone] Patient DOB: Nov 07, 1935 Practice Name[facility] Reason for Call: Kristie/Pharmacist calling about Rx for Zpac, Pharmacist does not know the dosage. Called Pt/[name] and she reports that she spoke with a Dr. Caryl Never nurse and she was going to call in a RX for Zpac. Pr thinks rhe dosage was for 6 days. Instructed to call office in am when office, unsure of dosage. Protocol(s) Used: Office Note Recommended Outcome per Protocol: Information Noted and Sent to Office Reason for Outcome: Caller information to office Care Advice: ~ 04/27/2011 9:53:25PM Page 1 of 1 [email or web address]

## 2011-04-28 NOTE — Telephone Encounter (Signed)
This should be for a 6 day course of medication.

## 2011-04-28 NOTE — Telephone Encounter (Signed)
This was taken care of early this AM, Rx called in

## 2011-07-25 ENCOUNTER — Ambulatory Visit (INDEPENDENT_AMBULATORY_CARE_PROVIDER_SITE_OTHER): Payer: Medicare Other | Admitting: Internal Medicine

## 2011-07-25 ENCOUNTER — Encounter: Payer: Self-pay | Admitting: Internal Medicine

## 2011-07-25 VITALS — BP 130/82 | HR 72 | Temp 98.2°F | Resp 16 | Ht 67.0 in | Wt 168.0 lb

## 2011-07-25 DIAGNOSIS — T887XXA Unspecified adverse effect of drug or medicament, initial encounter: Secondary | ICD-10-CM

## 2011-07-25 DIAGNOSIS — E871 Hypo-osmolality and hyponatremia: Secondary | ICD-10-CM

## 2011-07-25 DIAGNOSIS — K219 Gastro-esophageal reflux disease without esophagitis: Secondary | ICD-10-CM

## 2011-07-25 DIAGNOSIS — I10 Essential (primary) hypertension: Secondary | ICD-10-CM

## 2011-07-25 DIAGNOSIS — E785 Hyperlipidemia, unspecified: Secondary | ICD-10-CM

## 2011-07-25 LAB — BASIC METABOLIC PANEL
BUN: 13 mg/dL (ref 6–23)
Creatinine, Ser: 0.8 mg/dL (ref 0.4–1.2)
GFR: 74.52 mL/min (ref >60.00)
Glucose, Bld: 86 mg/dL (ref 70–99)
Potassium: 4 mEq/L (ref 3.5–5.1)

## 2011-07-25 NOTE — Assessment & Plan Note (Signed)
tendonitis

## 2011-07-25 NOTE — Assessment & Plan Note (Signed)
monitoring

## 2011-07-25 NOTE — Progress Notes (Signed)
Subjective:    Patient ID: Adriana Hendricks, female    DOB: September 24, 1935, 76 y.o.   MRN: [id number]  HPI Is seeing opthamology for macular degeneration and cloudly vision in right eye Blood pressure is stable Lipids at goal Had a reaction to levoquin  ( tendonitis) Monitoring Na and Bmet Colitis stable Had one episode of diarrhea   Review of Systems  Constitutional: Negative for activity change, appetite change and fatigue.  HENT: Negative for ear pain, congestion, neck pain, postnasal drip and sinus pressure.   Eyes: Positive for redness and visual disturbance.  Respiratory: Negative for cough, shortness of breath and wheezing.   Gastrointestinal: Negative for abdominal pain and abdominal distention.  Genitourinary: Negative for dysuria, frequency and menstrual problem.  Musculoskeletal: Negative for myalgias, joint swelling and arthralgias.  Skin: Negative for rash and wound.  Neurological: Negative for dizziness, weakness and headaches.  Hematological: Negative for adenopathy. Does not bruise/bleed easily.  Psychiatric/Behavioral: Negative for sleep disturbance and decreased concentration.   Past Medical History  Diagnosis Date  . Hypertension   . Diverticulosis   . Colon polyps     Tubular adenoma and hyperplastic  . Collagenous colitis     2005   Past Surgical History  Procedure Date  . Appendectomy   . Tonsillectomy   . Cataract extraction   . Moles removed   . Excision morton's neuroma     Right foot  . Mouth surgery     (For Exostosis)    reports that she has quit smoking. She does not have any smokeless tobacco history on file. She reports that she drinks about 4.2 ounces of alcohol per week. She reports that she does not use illicit drugs. family history includes Cancer in her father; Heart failure in her father; and Mental illness in her mother. Allergies  Allergen Reactions  . Clarithromycin     REACTION: nausea  . Erythromycin Base   . Levofloxacin    Patient has a history of a Achilles tendon tear and developed tendon pain while on medication  . Metronidazole     REACTION: Rash  . Penicillins        Objective:   Physical Exam  Nursing note and vitals reviewed. Constitutional: She is oriented to person, place, and time. She appears well-developed and well-nourished. No distress.  HENT:  Head: Normocephalic and atraumatic.  Right Ear: External ear normal.  Left Ear: External ear normal.  Nose: Nose normal.  Mouth/Throat: Oropharynx is clear and moist.  Eyes: Conjunctivae and EOM are normal. Pupils are equal, round, and reactive to light.  Neck: Normal range of motion. Neck supple. No JVD present. No tracheal deviation present. No thyromegaly present.  Cardiovascular: Normal rate, regular rhythm, normal heart sounds and intact distal pulses.   No murmur heard. Pulmonary/Chest: Effort normal and breath sounds normal. She has no wheezes. She exhibits no tenderness.  Abdominal: Soft. Bowel sounds are normal.  Musculoskeletal: Normal range of motion. She exhibits no edema and no tenderness.  Lymphadenopathy:    She has no cervical adenopathy.  Neurological: She is alert and oriented to person, place, and time. She has normal reflexes. No cranial nerve deficit.  Skin: Skin is warm and dry. She is not diaphoretic.  Psychiatric: She has a normal mood and affect. Her behavior is normal.          Assessment & Plan:  Monitoring for hyponatremia with a basic metabolic panel blood pressure is stable today doing well reviewed labs from January visit  including cholesterol thyroid basic metabolic panel and liver panel.  She is generally feeling well except for visual disturbance for which she is seeing the ophthalmologist. We reviewed all medications and allergies and took note of recent allergic reaction to Floxin with tendinitis ADVERSE DRUG REACTION tendonitis  HYPONATREMIA monitoring

## 2011-11-21 ENCOUNTER — Other Ambulatory Visit: Payer: Self-pay | Admitting: Internal Medicine

## 2011-11-21 MED ORDER — NADOLOL-BENDROFLUMETHIAZIDE 40-5 MG PO TABS: 1.0000 | ORAL_TABLET | Freq: Every day | ORAL | Status: DC

## 2011-11-21 NOTE — Telephone Encounter (Signed)
Pt called and is req to get refill of 30 day supply nadolol-bendroflumethiazide (CORZIDE) 40-5 MG per tablet to [facility] on [address].

## 2011-11-21 NOTE — Telephone Encounter (Signed)
done

## 2011-12-01 ENCOUNTER — Telehealth: Payer: Self-pay | Admitting: *Deleted

## 2011-12-01 ENCOUNTER — Ambulatory Visit (INDEPENDENT_AMBULATORY_CARE_PROVIDER_SITE_OTHER): Payer: Medicare Other | Admitting: Internal Medicine

## 2011-12-01 ENCOUNTER — Ambulatory Visit: Payer: Federal, State, Local not specified - PPO | Admitting: Internal Medicine

## 2011-12-01 DIAGNOSIS — Z23 Encounter for immunization: Secondary | ICD-10-CM

## 2011-12-01 NOTE — Telephone Encounter (Signed)
Pt needs a substitute for the corzide because it's on manufacture backorder. No pharmacy around here has it. Please advise pt on what to do. Pt needs it sent to a [facility]

## 2011-12-02 ENCOUNTER — Other Ambulatory Visit: Payer: Self-pay | Admitting: *Deleted

## 2011-12-02 MED ORDER — CHLORTHALIDONE 25 MG PO TABS: 25.0000 mg | ORAL_TABLET | Freq: Every day | ORAL | Status: DC

## 2011-12-02 MED ORDER — NADOLOL 40 MG PO TABS: 40.0000 mg | ORAL_TABLET | Freq: Every day | ORAL | Status: DC

## 2011-12-02 NOTE — Telephone Encounter (Signed)
Per dr Lovell Sheehan- order nadolol and chlorthalidone

## 2011-12-05 NOTE — Telephone Encounter (Signed)
done

## 2012-01-02 DIAGNOSIS — H5310 Unspecified subjective visual disturbances: Secondary | ICD-10-CM | POA: Diagnosis not present

## 2012-01-23 ENCOUNTER — Ambulatory Visit (INDEPENDENT_AMBULATORY_CARE_PROVIDER_SITE_OTHER): Payer: Medicare Other | Admitting: Internal Medicine

## 2012-01-23 ENCOUNTER — Encounter: Payer: Self-pay | Admitting: Internal Medicine

## 2012-01-23 VITALS — BP 130/80 | HR 72 | Temp 98.3°F | Resp 16 | Ht 66.0 in | Wt 168.0 lb

## 2012-01-23 DIAGNOSIS — Z0000 Encounter for general adult medical examination without abnormal findings: Secondary | ICD-10-CM

## 2012-01-23 DIAGNOSIS — M899 Disorder of bone, unspecified: Secondary | ICD-10-CM

## 2012-01-23 DIAGNOSIS — E785 Hyperlipidemia, unspecified: Secondary | ICD-10-CM

## 2012-01-23 DIAGNOSIS — T887XXA Unspecified adverse effect of drug or medicament, initial encounter: Secondary | ICD-10-CM

## 2012-01-23 DIAGNOSIS — Z23 Encounter for immunization: Secondary | ICD-10-CM

## 2012-01-23 DIAGNOSIS — E559 Vitamin D deficiency, unspecified: Secondary | ICD-10-CM

## 2012-01-23 DIAGNOSIS — M949 Disorder of cartilage, unspecified: Secondary | ICD-10-CM | POA: Diagnosis not present

## 2012-01-23 DIAGNOSIS — M8580 Other specified disorders of bone density and structure, unspecified site: Secondary | ICD-10-CM

## 2012-01-23 DIAGNOSIS — I10 Essential (primary) hypertension: Secondary | ICD-10-CM | POA: Diagnosis not present

## 2012-01-23 LAB — CBC WITH DIFFERENTIAL/PLATELET
Basophils Absolute: 0 10*3/uL (ref 0.0–0.1)
Eosinophils Absolute: 0.1 10*3/uL (ref 0.0–0.7)
HCT: 41 % (ref 36.0–46.0)
Hemoglobin: 14.4 g/dL (ref 12.0–15.0)
Lymphs Abs: 1 10*3/uL (ref 0.7–4.0)
MCHC: 35.1 g/dL (ref 30.0–36.0)
MCV: 95.4 fl (ref 78.0–100.0)
Monocytes Absolute: 0.4 10*3/uL (ref 0.1–1.0)
Neutro Abs: 4.6 10*3/uL (ref 1.4–7.7)
RDW: 12.2 % (ref 11.5–14.6)

## 2012-01-23 LAB — LIPID PANEL
HDL: 59.5 mg/dL (ref >39.00)
Triglycerides: 74 mg/dL (ref 0.0–149.0)

## 2012-01-23 LAB — HEPATIC FUNCTION PANEL
Albumin: 4.3 g/dL (ref 3.5–5.2)
Bilirubin, Direct: 0 mg/dL (ref 0.0–0.3)
Total Protein: 7.5 g/dL (ref 6.0–8.3)

## 2012-01-23 LAB — BASIC METABOLIC PANEL
CO2: 29 mEq/L (ref 19–32)
Calcium: 9.4 mg/dL (ref 8.4–10.5)
Creatinine, Ser: 0.7 mg/dL (ref 0.4–1.2)
GFR: 85.41 mL/min (ref >60.00)
Glucose, Bld: 98 mg/dL (ref 70–99)

## 2012-01-23 MED ORDER — NADOLOL-BENDROFLUMETHIAZIDE 40-5 MG PO TABS: 1.0000 | ORAL_TABLET | Freq: Every day | ORAL | Status: DC

## 2012-01-23 NOTE — Patient Instructions (Addendum)
The patient is instructed to continue all medications as prescribed. Schedule followup with check out clerk upon leaving the clinic Release orders today

## 2012-01-23 NOTE — Progress Notes (Signed)
Subjective:    Patient ID: Adriana Hendricks, female    DOB: August 24, 1935, 77 y.o.   MRN: [id number]  HPI Patient is a 77 year old female who presents for followup of chronic medical problems including mild hyperlipidemia hypertension gastroesophageal reflux and benign essential tremor. She is on Corzide for her hypertension she takes vitamin D for osteoporosis prevention as well as omega-3 supplements for cholesterol she is on Ocuvite vitamin for macular degeneration prevention. She currently does not take an aspirin a day   Review of Systems  Constitutional: Negative for activity change, appetite change and fatigue.  HENT: Negative for ear pain, congestion, neck pain, postnasal drip and sinus pressure.   Eyes: Negative for redness and visual disturbance.  Respiratory: Negative for cough, shortness of breath and wheezing.   Gastrointestinal: Negative for abdominal pain and abdominal distention.  Genitourinary: Negative for dysuria, frequency and menstrual problem.  Musculoskeletal: Negative for myalgias, joint swelling and arthralgias.  Skin: Negative for rash and wound.  Neurological: Negative for dizziness, weakness and headaches.  Hematological: Negative for adenopathy. Does not bruise/bleed easily.  Psychiatric/Behavioral: Negative for sleep disturbance and decreased concentration.   Past Medical History  Diagnosis Date  . Hypertension   . Diverticulosis   . Colon polyps     Tubular adenoma and hyperplastic  . Collagenous colitis     2005    History   Social History  . Marital Status: Married    Spouse Name: N/A    Number of Children: N/A  . Years of Education: N/A   Occupational History  . Not on file.   Social History Main Topics  . Smoking status: Former Games developer  . Smokeless tobacco: Not on file  . Alcohol Use: 4.2 oz/week    7 Glasses of wine per week  . Drug Use: No  . Sexually Active: Yes   Other Topics Concern  . Not on file   Social History Narrative  . No  narrative on file    Past Surgical History  Procedure Date  . Appendectomy   . Tonsillectomy   . Cataract extraction   . Moles removed   . Excision morton's neuroma     Right foot  . Mouth surgery     (For Exostosis)    Family History  Problem Relation Age of Onset  . Cancer Father     Rectal  . Heart failure Father   . Mental illness Mother     Dementia    Allergies  Allergen Reactions  . Clarithromycin     REACTION: nausea  . Erythromycin Base   . Levofloxacin     Patient has a history of a Achilles tendon tear and developed tendon pain while on medication  . Metronidazole     REACTION: Rash  . Penicillins     Current Outpatient Prescriptions on File Prior to Visit  Medication Sig Dispense Refill  . Cholecalciferol (VITAMIN D) 400 UNITS capsule Take 400 Units by mouth daily.        . fish oil-omega-3 fatty acids 1000 MG capsule Take 1 g by mouth daily. Patient states 10,000 once daily      . Multiple Vitamins-Minerals (OCUVITE PO) Take by mouth 2 (two) times daily.          BP 130/80  Pulse 72  Temp 98.3 F (36.8 C)  Resp 16  Ht 5\' 6"  (1.676 m)  Wt 168 lb (76.204 kg)  BMI 27.12 kg/m2       Objective:   Physical  Exam  Nursing note and vitals reviewed. Constitutional: She is oriented to person, place, and time. She appears well-developed and well-nourished. No distress.  HENT:  Head: Normocephalic and atraumatic.  Right Ear: External ear normal.  Left Ear: External ear normal.  Nose: Nose normal.  Mouth/Throat: Oropharynx is clear and moist.  Eyes: Conjunctivae and EOM are normal. Pupils are equal, round, and reactive to light.  Neck: Normal range of motion. Neck supple. No JVD present. No tracheal deviation present. No thyromegaly present.  Cardiovascular: Normal rate, regular rhythm, normal heart sounds and intact distal pulses.   No murmur heard. Pulmonary/Chest: Effort normal and breath sounds normal. She has no wheezes. She exhibits no  tenderness.  Abdominal: Soft. Bowel sounds are normal.  Musculoskeletal: Normal range of motion. She exhibits no edema and no tenderness.  Lymphadenopathy:    She has no cervical adenopathy.  Neurological: She is alert and oriented to person, place, and time. She has normal reflexes. No cranial nerve deficit.  Skin: Skin is warm and dry. She is not diaphoretic.  Psychiatric: She has a normal mood and affect. Her behavior is normal.          Assessment & Plan:  Today we will monitor basic metabolic panel to make sure that her diuretic therapy as part of her hypertensive therapy has not affect electrolytes we will also look at renal function she did have a vitamin D level drawn today we will measure a lipid panel and blood cataract LDL to monitor the effectiveness of omega-3 supplementation as treatment for mild hyperlipidemia.  She also presents today for a Medicare wellness screening Subjective:    Adriana Hendricks is a 77 y.o. female who presents for Medicare Annual/Subsequent preventive examination.  Preventive Screening-Counseling & Management  Tobacco History  Smoking status  . Former Smoker  Smokeless tobacco  . Not on file     Problems Prior to Visit 1.   Current Problems (verified) Patient Active Problem List  Diagnoses  . HYPERLIPIDEMIA, MILD  . HYPONATREMIA  . TREMOR, ESSENTIAL  . HYPERTENSION  . GASTROESOPHAGEAL REFLUX DISEASE  . DIVERTICULOSIS, COLON  . RECTAL BLEEDING  . ACNE ROSACEA  . COLLAGENOUS COLITIS  . DYSPHAGIA  . ADVERSE DRUG REACTION  . COLONIC POLYPS, ADENOMATOUS, HX OF  . ACHILLES TENDON TEAR    Medications Prior to Visit Current Outpatient Prescriptions on File Prior to Visit  Medication Sig Dispense Refill  . Cholecalciferol (VITAMIN D) 400 UNITS capsule Take 400 Units by mouth daily.        . fish oil-omega-3 fatty acids 1000 MG capsule Take 1 g by mouth daily. Patient states 10,000 once daily      . Multiple Vitamins-Minerals (OCUVITE  PO) Take by mouth 2 (two) times daily.          Current Medications (verified) Current Outpatient Prescriptions  Medication Sig Dispense Refill  . nadolol-bendroflumethiazide (CORZIDE) 40-5 MG per tablet Take 1 tablet by mouth daily.  90 tablet  3  . Cholecalciferol (VITAMIN D) 400 UNITS capsule Take 400 Units by mouth daily.        . fish oil-omega-3 fatty acids 1000 MG capsule Take 1 g by mouth daily. Patient states 10,000 once daily      . Multiple Vitamins-Minerals (OCUVITE PO) Take by mouth 2 (two) times daily.           Allergies (verified) Clarithromycin; Erythromycin base; Levofloxacin; Metronidazole; and Penicillins   PAST HISTORY  Family History Family History  Problem Relation  Age of Onset  . Cancer Father     Rectal  . Heart failure Father   . Mental illness Mother     Dementia    Social History History  Substance Use Topics  . Smoking status: Former Games developer  . Smokeless tobacco: Not on file  . Alcohol Use: 4.2 oz/week    7 Glasses of wine per week     Are there smokers in your home (other than you)? No  Risk Factors Current exercise habits: The patient does not participate in regular exercise at present.  Dietary issues discussed:  Cardiac risk factors: advanced age (older than 77 for men, 46 for women), dyslipidemia, hypertension and sedentary lifestyle.  Depression Screen (Note: if answer to either of the following is "Yes", a more complete depression screening is indicated)   Over the past two weeks, have you felt down, depressed or hopeless? No  Over the past two weeks, have you felt little interest or pleasure in doing things? No  Have you lost interest or pleasure in daily life? No  Do you often feel hopeless? No  Do you cry easily over simple problems? No  Activities of Daily Living In your present state of health, do you have any difficulty performing the following activities?:  Driving? No Managing money?  No Feeding yourself? No Getting  from bed to chair? No Climbing a flight of stairs? No Preparing food and eating?: No Bathing or showering? No Getting dressed: No Getting to the toilet? No Using the toilet:No Moving around from place to place: No In the past year have you fallen or had a near fall?:No   Are you sexually active?  Yes  Do you have more than one partner?  Yes  Hearing Difficulties: No Do you often ask people to speak up or repeat themselves? No Do you experience ringing or noises in your ears? No Do you have difficulty understanding soft or whispered voices? No   Do you feel that you have a problem with memory? No  Do you often misplace items? No  Do you feel safe at home?  No  Cognitive Testing  Alert? Yes  Normal Appearance?Yes  Oriented to person? Yes  Place? Yes   Time? Yes  Recall of three objects?  Yes  Can perform simple calculations? Yes  Displays appropriate judgment?Yes  Can read the correct time from a watch face?Yes   Advanced Directives have been discussed with the patient? Yes  List the Names of Other Physician/Practitioners you currently use: 1.    Indicate any recent Medical Services you may have received from other than [facility] providers in the past year (date may be approximate).  Immunization History  Administered Date(s) Administered  . Influenza Split 12/01/2011  . Influenza Whole 01/07/2010, 10/08/2010  . Pneumococcal Polysaccharide 12/26/2005  . Td 12/26/2001  . Tdap 01/23/2012  . Zoster 02/08/2010    Screening Tests Health Maintenance  Topic Date Due  . Tetanus/tdap  12/27/2011  . Influenza Vaccine  09/25/2012  . Colonoscopy  09/30/2019  . Pneumococcal Polysaccharide Vaccine Age 71 And Over  Completed  . Zostavax  Completed    All answers were reviewed with the patient and necessary referrals were made:  Carrie Mew, MD   01/23/2012   History reviewed: allergies, current medications, past family history, past medical history, past social history,  past surgical history and problem list  Review of Systems A comprehensive review of systems was negative.    Objective:  Vision by Snellen chart: right eye:20/20, left eye:20/20 wearing glasses  Body mass index is 27.12 kg/(m^2). BP 130/80  Pulse 72  Temp 98.3 F (36.8 C)  Resp 16  Ht 5\' 6"  (1.676 m)  Wt 168 lb (76.204 kg)  BMI 27.12 kg/m2  BP 130/80  Pulse 72  Temp 98.3 F (36.8 C)  Resp 16  Ht 5\' 6"  (1.676 m)  Wt 168 lb (76.204 kg)  BMI 27.12 kg/m2  General Appearance:    Alert, cooperative, no distress, appears stated age  Head:    Normocephalic, without obvious abnormality, atraumatic  Eyes:    PERRL, conjunctiva/corneas clear, EOM's intact, fundi    benign, both eyes  Ears:    Normal TM's and external ear canals, both ears  Nose:   Nares normal, septum midline, mucosa normal, no drainage    or sinus tenderness  Throat:   Lips, mucosa, and tongue normal; teeth and gums normal  Neck:   Supple, symmetrical, trachea midline, no adenopathy;    thyroid:  no enlargement/tenderness/nodules; no carotid   bruit or JVD  Back:     Symmetric, no curvature, ROM normal, no CVA tenderness  Lungs:     Clear to auscultation bilaterally, respirations unlabored  Chest Wall:    No tenderness or deformity   Heart:    Regular rate and rhythm, S1 and S2 normal, no murmur, rub   or gallop  Breast Exam:    No tenderness, masses, or nipple abnormality  Abdomen:     Soft, non-tender, bowel sounds active all four quadrants,    no masses, no organomegaly  Genitalia:    Normal female without lesion, discharge or tenderness  Rectal:    Normal tone, normal prostate, no masses or tenderness;   guaiac negative stool  Extremities:   Extremities normal, atraumatic, no cyanosis or edema  Pulses:   2+ and symmetric all extremities  Skin:   Skin color, texture, turgor normal, no rashes or lesions  Lymph nodes:   Cervical, supraclavicular, and axillary nodes normal  Neurologic:   CNII-XII intact,  normal strength, sensation and reflexes    throughout       Assessment:      This is a routine physical examination for this healthy  Female. Reviewed all health maintenance protocols including mammography colonoscopy bone density and reviewed appropriate screening labs. Her immunization history was reviewed as well as her current medications and allergies refills of her chronic medications were given and the plan for yearly health maintenance was discussed all orders and referrals were made as appropriate.      Plan:     During the course of the visit the patient was educated and counseled about appropriate screening and preventive services including:    Influenza vaccine  Bone densitometry screening  Advanced directives: has an advanced directive - a copy HAS NOT been provided.  Diet review for nutrition referral? Yes ____  Not Indicated ____   Patient Instructions (the written plan) was given to the patient.  Medicare Attestation I have personally reviewed: The patient's medical and social history Their use of alcohol, tobacco or illicit drugs Their current medications and supplements The patient's functional ability including ADLs,fall risks, home safety risks, cognitive, and hearing and visual impairment Diet and physical activities Evidence for depression or mood disorders  The patient's weight, height, BMI, and visual acuity have been recorded in the chart.  I have made referrals, counseling, and provided education to the patient based on review of  the above and I have provided the patient with a written personalized care plan for preventive services.     Carrie Mew, MD   01/23/2012

## 2012-01-23 NOTE — Progress Notes (Signed)
Addended by: Serena Colonel on: 01/23/2012 09:42 AM   Modules accepted: Orders

## 2012-03-01 DIAGNOSIS — Z13820 Encounter for screening for osteoporosis: Secondary | ICD-10-CM | POA: Diagnosis not present

## 2012-06-26 ENCOUNTER — Ambulatory Visit (INDEPENDENT_AMBULATORY_CARE_PROVIDER_SITE_OTHER)
Admission: RE | Admit: 2012-06-26 | Discharge: 2012-06-26 | Disposition: A | Discharge status: Home / Self Care | Payer: Medicare Other | Source: Ambulatory Visit | Attending: Internal Medicine | Admitting: Internal Medicine

## 2012-06-26 DIAGNOSIS — M8580 Other specified disorders of bone density and structure, unspecified site: Secondary | ICD-10-CM

## 2012-06-26 DIAGNOSIS — Z0000 Encounter for general adult medical examination without abnormal findings: Secondary | ICD-10-CM

## 2012-06-26 DIAGNOSIS — M899 Disorder of bone, unspecified: Secondary | ICD-10-CM | POA: Diagnosis not present

## 2012-06-26 DIAGNOSIS — M810 Age-related osteoporosis without current pathological fracture: Secondary | ICD-10-CM

## 2012-06-26 DIAGNOSIS — M949 Disorder of cartilage, unspecified: Secondary | ICD-10-CM | POA: Diagnosis not present

## 2012-07-03 ENCOUNTER — Encounter: Payer: Self-pay | Admitting: Internal Medicine

## 2012-07-19 ENCOUNTER — Telehealth: Payer: Self-pay | Admitting: Internal Medicine

## 2012-07-19 ENCOUNTER — Encounter: Payer: Self-pay | Admitting: Internal Medicine

## 2012-07-19 ENCOUNTER — Ambulatory Visit (INDEPENDENT_AMBULATORY_CARE_PROVIDER_SITE_OTHER): Payer: Medicare Other | Admitting: Internal Medicine

## 2012-07-19 VITALS — BP 120/74 | HR 72 | Temp 98.6°F | Resp 16 | Ht 66.0 in | Wt 164.0 lb

## 2012-07-19 DIAGNOSIS — R4789 Other speech disturbances: Secondary | ICD-10-CM | POA: Diagnosis not present

## 2012-07-19 DIAGNOSIS — M67919 Unspecified disorder of synovium and tendon, unspecified shoulder: Secondary | ICD-10-CM | POA: Diagnosis not present

## 2012-07-19 DIAGNOSIS — M719 Bursopathy, unspecified: Secondary | ICD-10-CM | POA: Diagnosis not present

## 2012-07-19 DIAGNOSIS — R4702 Dysphasia: Secondary | ICD-10-CM

## 2012-07-19 DIAGNOSIS — K222 Esophageal obstruction: Secondary | ICD-10-CM

## 2012-07-19 DIAGNOSIS — M778 Other enthesopathies, not elsewhere classified: Secondary | ICD-10-CM

## 2012-07-19 DIAGNOSIS — M7550 Bursitis of unspecified shoulder: Secondary | ICD-10-CM

## 2012-07-19 MED ORDER — METHYLPREDNISOLONE ACETATE 40 MG/ML IJ SUSP: 40.0000 mg | Freq: Once | INTRAMUSCULAR | Status: DC

## 2012-07-19 NOTE — Telephone Encounter (Signed)
Spoke with patient and offered to schedule her with extender but she wishes to see Dr. Juanda Chance. Scheduled her on 08/14/12 at 1:45 PM/2:00 PM.

## 2012-07-19 NOTE — Progress Notes (Signed)
  Subjective:    Patient ID: Adriana Hendricks, female    DOB: 1935/03/11, 77 y.o.   MRN: [id number]  HPI  Shoulder pain for 2 months Had moved in house to remodel repetative movement injury Hx of shoulder pain Has  Not been stretching Has immobilized Pain 6/10     Review of Systems  Constitutional: Negative for activity change, appetite change and fatigue.  HENT: Negative for ear pain, congestion, neck pain, postnasal drip and sinus pressure.   Eyes: Negative for redness and visual disturbance.  Respiratory: Negative for cough, shortness of breath and wheezing.   Gastrointestinal: Negative for abdominal pain and abdominal distention.  Genitourinary: Negative for dysuria, frequency and menstrual problem.  Musculoskeletal: Negative for myalgias, joint swelling and arthralgias.  Skin: Negative for rash and wound.  Neurological: Negative for dizziness, weakness and headaches.  Hematological: Negative for adenopathy. Does not bruise/bleed easily.  Psychiatric/Behavioral: Negative for disturbed wake/sleep cycle and decreased concentration.       Objective:   Physical Exam  Nursing note and vitals reviewed. Constitutional: She is oriented to person, place, and time. She appears well-developed and well-nourished. No distress.  HENT:  Head: Normocephalic and atraumatic.  Right Ear: External ear normal.  Left Ear: External ear normal.  Nose: Nose normal.  Mouth/Throat: Oropharynx is clear and moist.  Eyes: Conjunctivae and EOM are normal. Pupils are equal, round, and reactive to light.  Neck: Normal range of motion. Neck supple. No JVD present. No tracheal deviation present. No thyromegaly present.  Cardiovascular: Normal rate, regular rhythm, normal heart sounds and intact distal pulses.   No murmur heard. Pulmonary/Chest: Effort normal and breath sounds normal. She has no wheezes. She exhibits no tenderness.  Abdominal: Soft. Bowel sounds are normal.  Musculoskeletal: Normal range  of motion. She exhibits no edema and no tenderness.  Lymphadenopathy:    She has no cervical adenopathy.  Neurological: She is alert and oriented to person, place, and time. She has normal reflexes. No cranial nerve deficit.  Skin: Skin is warm and dry. She is not diaphoretic.  Psychiatric: She has a normal mood and affect. Her behavior is normal.          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  Patient's blood pressure stable her current medications we discussed the results of her last bone density examination we talked about her continued use of her blood pressure medications

## 2012-07-19 NOTE — Patient Instructions (Signed)

## 2012-07-23 ENCOUNTER — Ambulatory Visit: Payer: Federal, State, Local not specified - PPO | Admitting: Internal Medicine

## 2012-08-01 ENCOUNTER — Encounter: Payer: Self-pay | Admitting: *Deleted

## 2012-08-06 DIAGNOSIS — H43819 Vitreous degeneration, unspecified eye: Secondary | ICD-10-CM | POA: Diagnosis not present

## 2012-08-06 DIAGNOSIS — H3530 Unspecified macular degeneration: Secondary | ICD-10-CM | POA: Diagnosis not present

## 2012-08-06 DIAGNOSIS — H5210 Myopia, unspecified eye: Secondary | ICD-10-CM | POA: Diagnosis not present

## 2012-08-06 DIAGNOSIS — H5310 Unspecified subjective visual disturbances: Secondary | ICD-10-CM | POA: Diagnosis not present

## 2012-08-14 ENCOUNTER — Encounter: Payer: Self-pay | Admitting: Internal Medicine

## 2012-08-14 ENCOUNTER — Ambulatory Visit (INDEPENDENT_AMBULATORY_CARE_PROVIDER_SITE_OTHER): Payer: Medicare Other | Admitting: Internal Medicine

## 2012-08-14 VITALS — BP 124/68 | HR 72 | Ht 66.0 in | Wt 166.0 lb

## 2012-08-14 DIAGNOSIS — K219 Gastro-esophageal reflux disease without esophagitis: Secondary | ICD-10-CM | POA: Diagnosis not present

## 2012-08-14 DIAGNOSIS — R1310 Dysphagia, unspecified: Secondary | ICD-10-CM

## 2012-08-14 MED ORDER — FAMOTIDINE 10 MG PO TABS: 20.0000 mg | ORAL_TABLET | Freq: Every day | ORAL | Status: DC

## 2012-08-14 NOTE — Patient Instructions (Addendum)
You have been scheduled for an endoscopy with propofol. Please follow written instructions given to you at your visit today. If you use inhalers (even only as needed), please bring them with you on the day of your procedure. Please purchase Pepcid AC (you will need a total of 20 mg) and take at bedtime. CC: Dr J.Jenkins

## 2012-08-14 NOTE — Progress Notes (Signed)
Adriana Hendricks Apr 05, 1935 MRN [id number]  History of Present Illness:  This is a 77 year old white female with a history of benign distal esophageal stricture which was last dilated in April 2011 from 12-16 mm with Savary dilators. She was also noted to have reflux esophagitis and a 3 cm hiatal hernia. She had a recurrence of solid food dysphagia. She has had several discrete episodes of dysphagia and food regurgitation in recent months. She stopped taking her Prilosec because of concern for calcium malabsorption. She denies any heartburn. There was no evidence for Barrett's esophagus on her last endoscopy. She has a history of adenomatous polyps of the colon. Her last colonoscopy was in 2011. Her next colonoscopy will be due in April 2016.   Past Medical History  Diagnosis Date  . Hypertension   . Diverticulosis   . Colon polyps     Tubular adenoma and hyperplastic  . Collagenous colitis 2005  . Stricture esophagus   . Hiatal hernia   . Esophagitis   . GERD (gastroesophageal reflux disease)   . Hx: UTI (urinary tract infection)    Past Surgical History  Procedure Date  . Appendectomy   . Tonsillectomy   . Cataract extraction   . Moles removed   . Excision morton's neuroma     Right foot  . Mouth surgery     (For Exostosis)    reports that she has quit smoking. She has never used smokeless tobacco. She reports that she drinks about 1.2 ounces of alcohol per week. She reports that she does not use illicit drugs. family history includes Barrett's esophagus in her son; Celiac disease in her daughter; Dementia in her mother; Heart failure in her father; and Rectal cancer in her father. Allergies  Allergen Reactions  . Clarithromycin     REACTION: nausea  . Erythromycin Base   . Levofloxacin     Patient has a history of a Achilles tendon tear and developed tendon pain while on medication  . Metronidazole     REACTION: Rash  . Penicillins         Review of Systems: Denies  dysphagia to liquids. Denies cough or hoarseness  The remainder of the 10 point ROS is negative except as outlined in H&P   Physical Exam: General appearance  Well developed, in no distress. Eyes- non icteric. HEENT nontraumatic, normocephalic. Mouth no lesions, tongue papillated, no cheilosis. Neck supple without adenopathy, thyroid not enlarged, no carotid bruits, no JVD. Lungs Clear to auscultation bilaterally. Cor normal S1, normal S2, regular rhythm, no murmur,  quiet precordium. Abdomen: Soft nontender with normoactive bowel sounds. Rectal: Not done. Extremities no pedal edema. Skin no lesions. Neurological alert and oriented x 3. Psychological normal mood and affect.  Assessment and Plan:  Problem #1 Recurrent distal esophageal stricture last dilated in April 2011. Patient is now due for a repeat dilatation. Patient has not been taking her acid suppressing agents and that is why the stricture has recurred so soon. I have discussed with her the need to take some sort of acid suppressing agents so she doesn't have to have dilatations every 2 years. She agreed to purchase Pepcid 20 mg daily. We will schedule her for an upper endoscopy and esophageal dilation. She will continue antireflux measures.  Problem #2 Colorectal screening. She is up-to-date on her colonoscopy. She has a history of adenomatous polyps. Her next colonoscopy will be due in April 2016.  08/14/2012 Adriana Hendricks

## 2012-08-31 ENCOUNTER — Ambulatory Visit (AMBULATORY_SURGERY_CENTER): Payer: Medicare Other | Admitting: Internal Medicine

## 2012-08-31 ENCOUNTER — Encounter: Payer: Self-pay | Admitting: Internal Medicine

## 2012-08-31 VITALS — BP 175/79 | HR 57 | Temp 97.8°F | Resp 19 | Ht 66.0 in | Wt 166.0 lb

## 2012-08-31 DIAGNOSIS — R1319 Other dysphagia: Secondary | ICD-10-CM | POA: Diagnosis not present

## 2012-08-31 DIAGNOSIS — I10 Essential (primary) hypertension: Secondary | ICD-10-CM | POA: Diagnosis not present

## 2012-08-31 DIAGNOSIS — K222 Esophageal obstruction: Secondary | ICD-10-CM

## 2012-08-31 DIAGNOSIS — R1310 Dysphagia, unspecified: Secondary | ICD-10-CM | POA: Diagnosis not present

## 2012-08-31 DIAGNOSIS — K219 Gastro-esophageal reflux disease without esophagitis: Secondary | ICD-10-CM | POA: Diagnosis not present

## 2012-08-31 MED ORDER — SODIUM CHLORIDE 0.9 % IV SOLN: 500.0000 mL | INTRAVENOUS | Status: DC

## 2012-08-31 MED ORDER — OMEPRAZOLE 10 MG PO CPDR: 10.0000 mg | DELAYED_RELEASE_CAPSULE | Freq: Every day | ORAL | Status: DC

## 2012-08-31 NOTE — Patient Instructions (Addendum)

## 2012-08-31 NOTE — Progress Notes (Signed)
Patient did not experience any of the following events: a burn prior to discharge; a fall within the facility; wrong site/side/patient/procedure/implant event; or a hospital transfer or hospital admission upon discharge from the facility. (G8907) Patient did not have preoperative order for IV antibiotic SSI prophylaxis. (G8918)  

## 2012-08-31 NOTE — Op Note (Signed)
[facility] [address] [place], [address]   ENDOSCOPY PROCEDURE REPORT  PATIENT: Adriana Hendricks, Adriana Hendricks  MR#: [id number] BIRTHDATE: [birth year] , 77  yrs. old GENDER: Female ENDOSCOPIST: Hart Carwin, MD REFERRED BY:  Stacie Glaze, M.D. PROCEDURE DATE:  08/31/2012 PROCEDURE:  Savary dilation of esophagus and EGD ASA CLASS:     Class II INDICATIONS:  dysphagia.   hx of esoph stricture, last dilation 2011, did not take meds for acid supression. MEDICATIONS: MAC sedation, administered by CRNA and Propofol (Diprivan) 150 mg IV TOPICAL ANESTHETIC: Cetacaine Spray  DESCRIPTION OF PROCEDURE: After the risks benefits and alternatives of the procedure were thoroughly explained, informed consent was obtained.  The LB GIF-H180 [id number] endoscope was introduced through the mouth and advanced to the second portion of the duodenum. Without limitations.  The instrument was slowly withdrawn as the mucosa was fully examined.      ESOPHAGUS: mild pressure exerted on the scope , stricture size about 9 mm Stricture was found in the lower third of the esophagus.  The stenosis was traversable with the endoscope.   A 3 cm hiatal hernia was noted. Savary dilators 13,14,15,16 mm passed without difficulty, small amount of blood on the last dilator Retroflexed views revealed no abnormalities.     The scope was then withdrawn from the patient and the procedure completed.  COMPLICATIONS: There were no complications. ENDOSCOPIC IMPRESSION: 1.   Stricture was found in the lower third of the esophagus , 9 mm diameter, dilated with Savary dil  to 16 mm 2.   3 cm hiatal hernia  RECOMMENDATIONS: 1.  anti-reflux regimen to be follow 2.  pt is not satisfied with Pepcid, will switch to Prilosec 10 mg qd  REPEAT EXAM: for EGD with dilatation.  Marland Kitchen  as needed  eSigned:  Hart Carwin, MD 08/31/2012 11:38 AM   CC:

## 2012-09-03 ENCOUNTER — Telehealth: Payer: Self-pay | Admitting: *Deleted

## 2012-09-03 NOTE — Telephone Encounter (Signed)
  Follow up Call-  Call back number 08/31/2012  Post procedure Call Back phone  # [phone]  Permission to leave phone message Yes     Patient questions:  Do you have a fever, pain , or abdominal swelling? no Pain Score  0 *  Have you tolerated food without any problems? yes  Have you been able to return to your normal activities? yes  Do you have any questions about your discharge instructions: Diet   no Medications  no Follow up visit  no  Do you have questions or concerns about your Care? no  Actions: * If pain score is 4 or above: No action needed, pain <4.  Spoke with husband.

## 2012-09-19 ENCOUNTER — Encounter: Payer: Medicare Other | Admitting: Internal Medicine

## 2012-09-27 ENCOUNTER — Encounter: Payer: Medicare Other | Admitting: Internal Medicine

## 2012-09-27 DIAGNOSIS — H43399 Other vitreous opacities, unspecified eye: Secondary | ICD-10-CM | POA: Diagnosis not present

## 2012-09-27 DIAGNOSIS — H43819 Vitreous degeneration, unspecified eye: Secondary | ICD-10-CM | POA: Diagnosis not present

## 2012-11-05 ENCOUNTER — Ambulatory Visit (INDEPENDENT_AMBULATORY_CARE_PROVIDER_SITE_OTHER): Payer: Medicare Other | Admitting: Internal Medicine

## 2012-11-05 ENCOUNTER — Encounter: Payer: Self-pay | Admitting: Internal Medicine

## 2012-11-05 VITALS — BP 136/80 | HR 72 | Temp 98.2°F | Resp 16 | Ht 66.0 in | Wt 168.0 lb

## 2012-11-05 DIAGNOSIS — K219 Gastro-esophageal reflux disease without esophagitis: Secondary | ICD-10-CM | POA: Diagnosis not present

## 2012-11-05 DIAGNOSIS — M719 Bursopathy, unspecified: Secondary | ICD-10-CM

## 2012-11-05 DIAGNOSIS — K222 Esophageal obstruction: Secondary | ICD-10-CM

## 2012-11-05 DIAGNOSIS — M67912 Unspecified disorder of synovium and tendon, left shoulder: Secondary | ICD-10-CM

## 2012-11-05 DIAGNOSIS — M67919 Unspecified disorder of synovium and tendon, unspecified shoulder: Secondary | ICD-10-CM

## 2012-11-05 DIAGNOSIS — Z23 Encounter for immunization: Secondary | ICD-10-CM | POA: Diagnosis not present

## 2012-11-05 MED ORDER — METHYLPREDNISOLONE ACETATE 40 MG/ML IJ SUSP: 40.0000 mg | Freq: Once | INTRAMUSCULAR | Status: DC

## 2012-11-05 MED ORDER — OMEPRAZOLE 20 MG PO CPDR: 20.0000 mg | DELAYED_RELEASE_CAPSULE | Freq: Every day | ORAL | Status: DC

## 2012-11-05 MED ORDER — MELOXICAM 7.5 MG PO TABS: 7.5000 mg | ORAL_TABLET | Freq: Every day | ORAL | Status: DC

## 2012-11-05 NOTE — Progress Notes (Signed)
Subjective:    Patient ID: Adriana Hendricks, female    DOB: 28-Dec-1934, 77 y.o.   MRN: [id number]  HPI Patient is a 77 year old female who presents for followup of multiple medical problems including hyperlipidemia hypertension and a history of GERD with stricture disease she is status post dilatation of her esophagus with some recurrent symptomology she is not on a proton pump inhibitor.  Patient has a history of a torn tendon she has some stiffness in the joint has full mobility She is not on a PPI Shoulder pain in the left shoulder this is a chronic pain it rates radiates into the biceps tendon is present at the insertion of the rotator cuff    Review of Systems  Constitutional: Positive for fatigue. Negative for activity change and appetite change.  HENT: Negative for ear pain, congestion, neck pain, postnasal drip and sinus pressure.   Eyes: Negative for redness and visual disturbance.  Respiratory: Negative for cough, shortness of breath and wheezing.   Cardiovascular: Positive for leg swelling.  Gastrointestinal: Negative for abdominal pain and abdominal distention.  Genitourinary: Negative for dysuria, frequency and menstrual problem.  Musculoskeletal: Positive for joint swelling and gait problem. Negative for myalgias and arthralgias.  Skin: Negative for rash and wound.  Neurological: Positive for weakness. Negative for dizziness and headaches.  Hematological: Positive for adenopathy. Bruises/bleeds easily.  Psychiatric/Behavioral: Negative for sleep disturbance and decreased concentration.      Past Medical History  Diagnosis Date  . Hypertension   . Diverticulosis   . Colon polyps     Tubular adenoma and hyperplastic  . Collagenous colitis 2005  . Stricture esophagus   . Hiatal hernia   . Esophagitis   . GERD (gastroesophageal reflux disease)   . Hx: UTI (urinary tract infection)   . Heart murmur     History   Social History  . Marital Status: Married    Spouse  Name: N/A    Number of Children: 2  . Years of Education: N/A   Occupational History  . Retired    Social History Main Topics  . Smoking status: Former Games developer  . Smokeless tobacco: Never Used  . Alcohol Use: 1.2 oz/week    2 Glasses of wine per week  . Drug Use: No  . Sexually Active: Yes   Other Topics Concern  . Not on file   Social History Narrative   Daily caffeine     Past Surgical History  Procedure Date  . Appendectomy   . Tonsillectomy   . Cataract extraction   . Moles removed   . Excision morton's neuroma     Right foot  . Mouth surgery     (For Exostosis)  . Intraocular lens implant, secondary     Family History  Problem Relation Age of Onset  . Rectal cancer Father   . Heart failure Father   . Dementia Mother   . Celiac disease Daughter   . Barrett's esophagus Son     Allergies  Allergen Reactions  . Clarithromycin     REACTION: nausea  . Erythromycin Base   . Levofloxacin     Patient has a history of a Achilles tendon tear and developed tendon pain while on medication  . Metronidazole     REACTION: Rash  . Penicillins     Current Outpatient Prescriptions on File Prior to Visit  Medication Sig Dispense Refill  . Cholecalciferol (VITAMIN D) 400 UNITS capsule Take 400 Units by mouth daily.        Marland Kitchen  fish oil-omega-3 fatty acids 1000 MG capsule Take by mouth daily.       . Multiple Vitamins-Minerals (CENTRUM SILVER PO) Take 1 capsule by mouth daily.      . nadolol-bendroflumethiazide (CORZIDE) 40-5 MG per tablet Take 1 tablet by mouth daily.  90 tablet  3  . Saccharomyces boulardii (FLORASTOR PO) Take by mouth. Takes as needed        BP 136/80  Pulse 72  Temp 98.2 F (36.8 C)  Resp 16  Ht 5\' 6"  (1.676 m)  Wt 168 lb (76.204 kg)  BMI 27.12 kg/m2    Objective:   Physical Exam  Nursing note and vitals reviewed. Constitutional: She is oriented to person, place, and time. She appears well-developed and well-nourished. No distress.    HENT:  Head: Normocephalic and atraumatic.  Right Ear: External ear normal.  Left Ear: External ear normal.  Nose: Nose normal.  Mouth/Throat: Oropharynx is clear and moist.  Eyes: Conjunctivae normal and EOM are normal. Pupils are equal, round, and reactive to light.  Neck: Normal range of motion. Neck supple. No JVD present. No tracheal deviation present. No thyromegaly present.  Cardiovascular: Normal rate, regular rhythm, normal heart sounds and intact distal pulses.   No murmur heard. Pulmonary/Chest: Effort normal and breath sounds normal. She has no wheezes. She exhibits no tenderness.  Abdominal: Soft. Bowel sounds are normal.  Musculoskeletal: Normal range of motion. She exhibits no edema and no tenderness.  Lymphadenopathy:    She has no cervical adenopathy.  Neurological: She is alert and oriented to person, place, and time. She has normal reflexes. No cranial nerve deficit.  Skin: Skin is warm and dry. She is not diaphoretic.  Psychiatric: She has a normal mood and affect. Her behavior is normal.          Assessment & Plan:  I suspect she has rotator cuff disease most probably a rotator cuff tear versus a tendinitis and bursitis we will give her a steroid injection in her joint today and monitor April she has a good response we will consider physical therapy if she has a poor response she will need an MRI of her shoulder.   Informed consent obtained and the patient's left shoulder was prepped with betadine. Local anesthesia was obtained with topical spray. Then 40 mg of Depo-Medrol and 1/2 cc of lidocaine was injected into the joint space. The patient tolerated the procedure without complications. Post injection care discussed with patient.  We discussed the patient's GERD with reflux symptoms and with her stricture we recommended that she begin Prilosec 20 mg by mouth daily initially  Discussed her osteoarthritis and we believe Mobic would be a safer choice than  naproxen.  Her blood pressure stable her current medication

## 2012-11-05 NOTE — Patient Instructions (Addendum)
The patient is instructed to continue all medications as prescribed. Schedule followup with check out clerk upon leaving the clinic  

## 2012-11-13 ENCOUNTER — Ambulatory Visit
Admission: RE | Admit: 2012-11-13 | Discharge: 2012-11-13 | Disposition: A | Discharge status: Home / Self Care | Payer: Medicare Other | Source: Ambulatory Visit | Attending: Internal Medicine | Admitting: Internal Medicine

## 2012-11-13 DIAGNOSIS — M19019 Primary osteoarthritis, unspecified shoulder: Secondary | ICD-10-CM | POA: Diagnosis not present

## 2012-11-13 DIAGNOSIS — M67912 Unspecified disorder of synovium and tendon, left shoulder: Secondary | ICD-10-CM

## 2012-11-13 DIAGNOSIS — M75100 Unspecified rotator cuff tear or rupture of unspecified shoulder, not specified as traumatic: Secondary | ICD-10-CM | POA: Diagnosis not present

## 2012-11-13 DIAGNOSIS — IMO0002 Reserved for concepts with insufficient information to code with codable children: Secondary | ICD-10-CM | POA: Diagnosis not present

## 2012-11-13 DIAGNOSIS — M67919 Unspecified disorder of synovium and tendon, unspecified shoulder: Secondary | ICD-10-CM | POA: Diagnosis not present

## 2012-11-13 DIAGNOSIS — M719 Bursopathy, unspecified: Secondary | ICD-10-CM | POA: Diagnosis not present

## 2012-11-19 ENCOUNTER — Telehealth: Payer: Self-pay | Admitting: Internal Medicine

## 2012-11-19 ENCOUNTER — Other Ambulatory Visit: Payer: Self-pay | Admitting: Internal Medicine

## 2012-11-19 DIAGNOSIS — M778 Other enthesopathies, not elsewhere classified: Secondary | ICD-10-CM

## 2012-11-19 NOTE — Telephone Encounter (Signed)
Patient called stating that she would like a call back with MRI results. Please assist.  °

## 2012-11-20 ENCOUNTER — Ambulatory Visit (INDEPENDENT_AMBULATORY_CARE_PROVIDER_SITE_OTHER): Payer: Medicare Other | Admitting: Family

## 2012-11-20 ENCOUNTER — Encounter: Payer: Self-pay | Admitting: Family

## 2012-11-20 VITALS — BP 136/80 | Temp 98.0°F | Wt 170.0 lb

## 2012-11-20 DIAGNOSIS — S61019A Laceration without foreign body of unspecified thumb without damage to nail, initial encounter: Secondary | ICD-10-CM

## 2012-11-20 DIAGNOSIS — S61209A Unspecified open wound of unspecified finger without damage to nail, initial encounter: Secondary | ICD-10-CM

## 2012-11-20 NOTE — Progress Notes (Signed)
Subjective:    Patient ID: Adriana Hendricks, female    DOB: 07-Nov-1935, 77 y.o.   MRN: [id number]  HPI 77 year old white female, nonsmoker, patient of Dr. Lovell Sheehan is in today with a laceration to her right thumb that occurred  2 hours ago at home, with a knife. this has been bleeding. Has minimal pain.   Review of Systems  Constitutional: Negative.   Respiratory: Negative.   Cardiovascular: Negative.   Musculoskeletal: Negative.   Skin: Positive for wound.       Laceration to the right tip of the thumb  Neurological: Negative.  Negative for numbness.  Hematological: Negative.   Psychiatric/Behavioral: Negative.    Past Medical History  Diagnosis Date  . Hypertension   . Diverticulosis   . Colon polyps     Tubular adenoma and hyperplastic  . Collagenous colitis 2005  . Stricture esophagus   . Hiatal hernia   . Esophagitis   . GERD (gastroesophageal reflux disease)   . Hx: UTI (urinary tract infection)   . Heart murmur     History   Social History  . Marital Status: Married    Spouse Name: N/A    Number of Children: 2  . Years of Education: N/A   Occupational History  . Retired    Social History Main Topics  . Smoking status: Former Games developer  . Smokeless tobacco: Never Used  . Alcohol Use: 1.2 oz/week    2 Glasses of wine per week  . Drug Use: No  . Sexually Active: Yes   Other Topics Concern  . Not on file   Social History Narrative   Daily caffeine     Past Surgical History  Procedure Date  . Appendectomy   . Tonsillectomy   . Cataract extraction   . Moles removed   . Excision morton's neuroma     Right foot  . Mouth surgery     (For Exostosis)  . Intraocular lens implant, secondary     Family History  Problem Relation Age of Onset  . Rectal cancer Father   . Heart failure Father   . Dementia Mother   . Celiac disease Daughter   . Barrett's esophagus Son     Allergies  Allergen Reactions  . Clarithromycin     REACTION: nausea  .  Erythromycin Base   . Levofloxacin     Patient has a history of a Achilles tendon tear and developed tendon pain while on medication  . Metronidazole     REACTION: Rash  . Penicillins     Current Outpatient Prescriptions on File Prior to Visit  Medication Sig Dispense Refill  . Cholecalciferol (VITAMIN D) 400 UNITS capsule Take 400 Units by mouth daily.        . fish oil-omega-3 fatty acids 1000 MG capsule Take by mouth daily.       . meloxicam (MOBIC) 7.5 MG tablet Take 1 tablet (7.5 mg total) by mouth daily.  30 tablet  3  . Multiple Vitamins-Minerals (CENTRUM SILVER PO) Take 1 capsule by mouth daily.      . nadolol-bendroflumethiazide (CORZIDE) 40-5 MG per tablet Take 1 tablet by mouth daily.  90 tablet  3  . omeprazole (PRILOSEC) 20 MG capsule Take 1 capsule (20 mg total) by mouth daily.  30 capsule  3  . Saccharomyces boulardii (FLORASTOR PO) Take by mouth. Takes as needed        BP 136/80  Temp 98 F (36.7 C) (Oral)  Wt 170 lb (  77.111 kg)chart    Objective:   Physical Exam  Constitutional: She is oriented to person, place, and time. She appears well-developed and well-nourished.  Cardiovascular: Normal rate and regular rhythm.   Pulmonary/Chest: Effort normal and breath sounds normal.  Neurological: She is alert and oriented to person, place, and time.  Skin: Skin is warm and dry.       Small, 1 cm laceration noted to the distal right thumb. Good circulation.  Psychiatric: She has a normal mood and affect.       Dermabond applied to the right distal call. No active bleeding. Edges well approximated.     Assessment & Plan:  assessment: Laceration right thumb  Plan: Care instructions given on a laceration with Dermabond. Signs and symptoms of infection were discussed. Patient call the office with any questions or concerns. Recheck a schedule, and when necessary.

## 2012-11-20 NOTE — Patient Instructions (Addendum)

## 2012-11-26 ENCOUNTER — Ambulatory Visit: Payer: Medicare Other | Attending: Internal Medicine | Admitting: Physical Therapy

## 2012-11-26 DIAGNOSIS — IMO0001 Reserved for inherently not codable concepts without codable children: Secondary | ICD-10-CM | POA: Diagnosis not present

## 2012-11-26 DIAGNOSIS — M25519 Pain in unspecified shoulder: Secondary | ICD-10-CM | POA: Diagnosis not present

## 2012-11-29 ENCOUNTER — Ambulatory Visit: Payer: Medicare Other | Admitting: Physical Therapy

## 2012-12-04 ENCOUNTER — Ambulatory Visit: Payer: Medicare Other | Admitting: Physical Therapy

## 2012-12-06 ENCOUNTER — Encounter: Payer: Medicare Other | Admitting: Physical Therapy

## 2012-12-07 ENCOUNTER — Ambulatory Visit: Payer: Medicare Other | Admitting: Physical Therapy

## 2012-12-11 ENCOUNTER — Ambulatory Visit: Payer: Medicare Other | Admitting: Physical Therapy

## 2012-12-13 ENCOUNTER — Ambulatory Visit: Payer: Medicare Other | Admitting: Rehabilitation

## 2012-12-24 ENCOUNTER — Ambulatory Visit: Payer: Medicare Other | Admitting: Rehabilitation

## 2012-12-28 ENCOUNTER — Ambulatory Visit: Payer: Medicare Other | Attending: Internal Medicine | Admitting: Physical Therapy

## 2012-12-28 DIAGNOSIS — IMO0001 Reserved for inherently not codable concepts without codable children: Secondary | ICD-10-CM | POA: Insufficient documentation

## 2012-12-28 DIAGNOSIS — M25519 Pain in unspecified shoulder: Secondary | ICD-10-CM | POA: Diagnosis not present

## 2012-12-28 DIAGNOSIS — M25619 Stiffness of unspecified shoulder, not elsewhere classified: Secondary | ICD-10-CM | POA: Diagnosis not present

## 2012-12-31 ENCOUNTER — Ambulatory Visit: Payer: Medicare Other | Admitting: Physical Therapy

## 2012-12-31 DIAGNOSIS — IMO0001 Reserved for inherently not codable concepts without codable children: Secondary | ICD-10-CM | POA: Diagnosis not present

## 2012-12-31 DIAGNOSIS — M25619 Stiffness of unspecified shoulder, not elsewhere classified: Secondary | ICD-10-CM | POA: Diagnosis not present

## 2012-12-31 DIAGNOSIS — M25519 Pain in unspecified shoulder: Secondary | ICD-10-CM | POA: Diagnosis not present

## 2013-01-03 ENCOUNTER — Ambulatory Visit: Payer: Medicare Other | Admitting: Physical Therapy

## 2013-01-03 ENCOUNTER — Encounter: Payer: Medicare Other | Admitting: Physical Therapy

## 2013-01-03 DIAGNOSIS — M25519 Pain in unspecified shoulder: Secondary | ICD-10-CM | POA: Diagnosis not present

## 2013-01-03 DIAGNOSIS — Z961 Presence of intraocular lens: Secondary | ICD-10-CM | POA: Diagnosis not present

## 2013-01-03 DIAGNOSIS — IMO0001 Reserved for inherently not codable concepts without codable children: Secondary | ICD-10-CM | POA: Diagnosis not present

## 2013-01-03 DIAGNOSIS — M25619 Stiffness of unspecified shoulder, not elsewhere classified: Secondary | ICD-10-CM | POA: Diagnosis not present

## 2013-01-03 DIAGNOSIS — H43399 Other vitreous opacities, unspecified eye: Secondary | ICD-10-CM | POA: Diagnosis not present

## 2013-01-07 ENCOUNTER — Ambulatory Visit: Payer: Medicare Other | Admitting: Rehabilitation

## 2013-01-07 DIAGNOSIS — M25619 Stiffness of unspecified shoulder, not elsewhere classified: Secondary | ICD-10-CM | POA: Diagnosis not present

## 2013-01-07 DIAGNOSIS — M25519 Pain in unspecified shoulder: Secondary | ICD-10-CM | POA: Diagnosis not present

## 2013-01-07 DIAGNOSIS — IMO0001 Reserved for inherently not codable concepts without codable children: Secondary | ICD-10-CM | POA: Diagnosis not present

## 2013-01-09 ENCOUNTER — Ambulatory Visit (INDEPENDENT_AMBULATORY_CARE_PROVIDER_SITE_OTHER): Payer: Medicare Other | Admitting: Internal Medicine

## 2013-01-09 ENCOUNTER — Encounter: Payer: Self-pay | Admitting: Internal Medicine

## 2013-01-09 VITALS — BP 120/70 | HR 72 | Temp 98.2°F | Resp 16 | Ht 66.0 in | Wt 172.0 lb

## 2013-01-09 DIAGNOSIS — K222 Esophageal obstruction: Secondary | ICD-10-CM

## 2013-01-09 DIAGNOSIS — S43429A Sprain of unspecified rotator cuff capsule, initial encounter: Secondary | ICD-10-CM

## 2013-01-09 DIAGNOSIS — K219 Gastro-esophageal reflux disease without esophagitis: Secondary | ICD-10-CM | POA: Diagnosis not present

## 2013-01-09 DIAGNOSIS — M75110 Incomplete rotator cuff tear or rupture of unspecified shoulder, not specified as traumatic: Secondary | ICD-10-CM

## 2013-01-09 MED ORDER — OMEPRAZOLE 20 MG PO CPDR: 20.0000 mg | DELAYED_RELEASE_CAPSULE | Freq: Every day | ORAL | Status: DC

## 2013-01-09 MED ORDER — NADOLOL-BENDROFLUMETHIAZIDE 40-5 MG PO TABS: 1.0000 | ORAL_TABLET | Freq: Every day | ORAL | Status: DC

## 2013-01-09 NOTE — Progress Notes (Signed)
  Subjective:    Patient ID: Adriana Hendricks, female    DOB: [birth year], 78 y.o.   MRN: [id number]  HPI  The patient comes for followup of physical therapy for partial tear of the rotator cuff and the bursitis.  Despite several weeks of physical therapy she has significant pain especially in the  insertion of her deltoid on the left shoulder. Injury was probably precipitated by a fall She has had iontophoresis without effect Stable GERD  Review of Systems  Constitutional: Negative for activity change, appetite change and fatigue.  HENT: Negative for ear pain, congestion, neck pain, postnasal drip and sinus pressure.   Eyes: Negative for redness and visual disturbance.  Respiratory: Negative for cough, shortness of breath and wheezing.   Gastrointestinal: Negative for abdominal pain and abdominal distention.  Genitourinary: Negative for dysuria, frequency and menstrual problem.  Musculoskeletal: Positive for myalgias and joint swelling. Negative for arthralgias.  Skin: Negative for rash and wound.  Neurological: Negative for dizziness, weakness and headaches.  Hematological: Negative for adenopathy. Does not bruise/bleed easily.  Psychiatric/Behavioral: Negative for sleep disturbance and decreased concentration.       Objective:   Physical Exam  Constitutional: She is oriented to person, place, and time. She appears well-developed and well-nourished. No distress.  HENT:  Head: Normocephalic and atraumatic.  Right Ear: External ear normal.  Left Ear: External ear normal.  Nose: Nose normal.  Mouth/Throat: Oropharynx is clear and moist.  Eyes: Conjunctivae normal and EOM are normal. Pupils are equal, round, and reactive to light.  Neck: Normal range of motion. Neck supple. No JVD present. No tracheal deviation present. No thyromegaly present.  Cardiovascular: Normal rate, regular rhythm, normal heart sounds and intact distal pulses.   No murmur heard. Pulmonary/Chest: Effort normal  and breath sounds normal. She has no wheezes. She exhibits no tenderness.  Abdominal: Soft. Bowel sounds are normal.  Musculoskeletal: Normal range of motion. She exhibits edema and tenderness.       Marked tenderness to lateral aspects of the deltoid muscle  Lymphadenopathy:    She has no cervical adenopathy.  Neurological: She is alert and oriented to person, place, and time. She has normal reflexes. No cranial nerve deficit.  Skin: Skin is warm and dry. She is not diaphoretic.  Psychiatric: She has a normal mood and affect. Her behavior is normal.          Assessment & Plan:  As rotator cuff tear and severe bursitis Recommend immobilization for 2 weeks with a shoulder immobilizer for hearing then began care for physical therapy History of Achilles tendon tear History of osteoarthritis  GERD is stable but she is intolerant of nonsteroidals

## 2013-01-09 NOTE — Patient Instructions (Signed)
I would recommend that she use a shoulder sling for shoulder immobilization for the tendinitis and bursitis for the next 3 Weeks  Use the pensaid  drops 10 drops on the area of most tenderness4 times a day

## 2013-01-10 ENCOUNTER — Ambulatory Visit: Payer: Medicare Other | Admitting: Rehabilitation

## 2013-01-28 ENCOUNTER — Ambulatory Visit: Payer: Medicare Other | Attending: Internal Medicine | Admitting: Physical Therapy

## 2013-01-28 DIAGNOSIS — M25619 Stiffness of unspecified shoulder, not elsewhere classified: Secondary | ICD-10-CM | POA: Insufficient documentation

## 2013-01-28 DIAGNOSIS — IMO0001 Reserved for inherently not codable concepts without codable children: Secondary | ICD-10-CM | POA: Insufficient documentation

## 2013-01-28 DIAGNOSIS — M25519 Pain in unspecified shoulder: Secondary | ICD-10-CM | POA: Insufficient documentation

## 2013-02-04 ENCOUNTER — Ambulatory Visit: Payer: Medicare Other | Admitting: Physical Therapy

## 2013-02-04 DIAGNOSIS — M25519 Pain in unspecified shoulder: Secondary | ICD-10-CM | POA: Diagnosis not present

## 2013-02-04 DIAGNOSIS — M25619 Stiffness of unspecified shoulder, not elsewhere classified: Secondary | ICD-10-CM | POA: Diagnosis not present

## 2013-02-04 DIAGNOSIS — IMO0001 Reserved for inherently not codable concepts without codable children: Secondary | ICD-10-CM | POA: Diagnosis not present

## 2013-02-06 ENCOUNTER — Ambulatory Visit: Payer: Medicare Other | Admitting: Internal Medicine

## 2013-02-11 ENCOUNTER — Ambulatory Visit: Payer: Medicare Other | Admitting: Rehabilitation

## 2013-02-11 DIAGNOSIS — M25619 Stiffness of unspecified shoulder, not elsewhere classified: Secondary | ICD-10-CM | POA: Diagnosis not present

## 2013-02-11 DIAGNOSIS — IMO0001 Reserved for inherently not codable concepts without codable children: Secondary | ICD-10-CM | POA: Diagnosis not present

## 2013-02-11 DIAGNOSIS — M25519 Pain in unspecified shoulder: Secondary | ICD-10-CM | POA: Diagnosis not present

## 2013-02-18 ENCOUNTER — Ambulatory Visit: Payer: Medicare Other | Admitting: Physical Therapy

## 2013-02-18 DIAGNOSIS — IMO0001 Reserved for inherently not codable concepts without codable children: Secondary | ICD-10-CM | POA: Diagnosis not present

## 2013-02-18 DIAGNOSIS — M25619 Stiffness of unspecified shoulder, not elsewhere classified: Secondary | ICD-10-CM | POA: Diagnosis not present

## 2013-02-18 DIAGNOSIS — M25519 Pain in unspecified shoulder: Secondary | ICD-10-CM | POA: Diagnosis not present

## 2013-02-25 ENCOUNTER — Ambulatory Visit: Payer: Medicare Other | Admitting: Physical Therapy

## 2013-03-04 ENCOUNTER — Ambulatory Visit: Payer: Medicare Other | Attending: Internal Medicine | Admitting: Physical Therapy

## 2013-03-04 DIAGNOSIS — M25519 Pain in unspecified shoulder: Secondary | ICD-10-CM | POA: Diagnosis not present

## 2013-03-04 DIAGNOSIS — IMO0001 Reserved for inherently not codable concepts without codable children: Secondary | ICD-10-CM | POA: Insufficient documentation

## 2013-03-04 DIAGNOSIS — M25619 Stiffness of unspecified shoulder, not elsewhere classified: Secondary | ICD-10-CM | POA: Insufficient documentation

## 2013-04-19 ENCOUNTER — Ambulatory Visit: Payer: Medicare Other | Admitting: Internal Medicine

## 2013-05-08 DIAGNOSIS — M25519 Pain in unspecified shoulder: Secondary | ICD-10-CM | POA: Diagnosis not present

## 2013-06-19 DIAGNOSIS — M75110 Incomplete rotator cuff tear or rupture of unspecified shoulder, not specified as traumatic: Secondary | ICD-10-CM | POA: Diagnosis not present

## 2013-06-19 DIAGNOSIS — M67919 Unspecified disorder of synovium and tendon, unspecified shoulder: Secondary | ICD-10-CM | POA: Diagnosis not present

## 2013-06-19 DIAGNOSIS — G5600 Carpal tunnel syndrome, unspecified upper limb: Secondary | ICD-10-CM | POA: Diagnosis not present

## 2013-06-19 DIAGNOSIS — M719 Bursopathy, unspecified: Secondary | ICD-10-CM | POA: Diagnosis not present

## 2013-06-19 DIAGNOSIS — M25519 Pain in unspecified shoulder: Secondary | ICD-10-CM | POA: Diagnosis not present

## 2013-07-01 ENCOUNTER — Telehealth: Payer: Self-pay | Admitting: Internal Medicine

## 2013-07-01 NOTE — Telephone Encounter (Signed)
Patient Information:  Caller Name: Adriana Hendricks  Phone: [phone]  Patient: Adriana Hendricks, Adriana Hendricks  Gender: Female  DOB: [birth year]  Age: 78 Years  PCP: Adriana Hendricks (Adults only)  Office Follow Up:  Does the office need to follow up with this patient?: Yes  Instructions For The Office: In addition to an appointment this week for new numbness/tingling in feet bilaterally (RN/CAN transferred to office for appointment), she would like some labs done T3,T4 and B12.  RN Note:  Due to upcoming surgery would like to have T3/T4, B12 levels drawn and seen for new numbness/tingling in feet bilaterally. Daesia would like an appointment later in the week, RN/CAN transferred to the office.   Symptoms  Reason For Call & Symptoms: Onset  March 2014 of numbness in feet and thumb and index finger. She is trying to figure out if shoulder and rotator cuff issue could be from Physical therapy "bands" changed or Vitamin B 12 deficiency. Prilosec since 01/09/2013  Reviewed Health History In EMR: Yes  Reviewed Medications In EMR: Yes  Reviewed Allergies In EMR: Yes  Reviewed Surgeries / Procedures: Yes  Date of Onset of Symptoms: 06/30/2013  Guideline(s) Used:  Neurologic Deficit  Foot Pain  Disposition Per Guideline:   See Within 3 Days in Office  Reason For Disposition Reached:   Numbness or tingling in feet and new or increased  Advice Given:  N/A  Patient Will Follow Care Advice:  YES

## 2013-07-01 NOTE — Telephone Encounter (Signed)
Ov given by Jeronimo Norma

## 2013-07-05 ENCOUNTER — Encounter: Payer: Self-pay | Admitting: Internal Medicine

## 2013-07-05 ENCOUNTER — Ambulatory Visit (INDEPENDENT_AMBULATORY_CARE_PROVIDER_SITE_OTHER): Payer: Medicare Other | Admitting: Internal Medicine

## 2013-07-05 VITALS — BP 124/72 | HR 72 | Temp 98.6°F | Resp 16 | Ht 66.0 in | Wt 172.0 lb

## 2013-07-05 DIAGNOSIS — G252 Other specified forms of tremor: Secondary | ICD-10-CM

## 2013-07-05 DIAGNOSIS — I10 Essential (primary) hypertension: Secondary | ICD-10-CM

## 2013-07-05 DIAGNOSIS — G609 Hereditary and idiopathic neuropathy, unspecified: Secondary | ICD-10-CM

## 2013-07-05 DIAGNOSIS — G250 Essential tremor: Secondary | ICD-10-CM

## 2013-07-05 DIAGNOSIS — G5632 Lesion of radial nerve, left upper limb: Secondary | ICD-10-CM | POA: Insufficient documentation

## 2013-07-05 LAB — HEMOGLOBIN A1C: Hgb A1c MFr Bld: 6 % (ref 4.6–6.5)

## 2013-07-05 NOTE — Patient Instructions (Signed)
The patient is instructed to continue all medications as prescribed. Schedule followup with check out clerk upon leaving the clinic  

## 2013-07-05 NOTE — Progress Notes (Signed)
  Subjective:    Patient ID: Adriana Hendricks, female    DOB: [birth year], 78 y.o.   MRN: [id number]  HPI Numbness and tingling and left upper extremity with history of rotator cuff tear physical therapy distribution of numbness and tingling involves the first finger and the thumb in a radial nerve distribution.  Bilateral foot numbness and tingling primarily on the inferior aspects of the foot more symmetrical in etiology. No history of diabetes does have chronic gastroesophageal reflux and a PPI does take a probiotic    Review of Systems  Constitutional: Negative for activity change, appetite change and fatigue.  HENT: Negative for ear pain, congestion, neck pain, postnasal drip and sinus pressure.   Eyes: Negative for redness and visual disturbance.  Respiratory: Negative for cough, shortness of breath and wheezing.   Gastrointestinal: Negative for abdominal pain and abdominal distention.  Genitourinary: Negative for dysuria, frequency and menstrual problem.  Musculoskeletal: Negative for myalgias, joint swelling and arthralgias.  Skin: Negative for rash and wound.  Neurological: Negative for dizziness, weakness and headaches.  Hematological: Negative for adenopathy. Does not bruise/bleed easily.  Psychiatric/Behavioral: Negative for sleep disturbance and decreased concentration.       Objective:   Physical Exam  Constitutional: She is oriented to person, place, and time. She appears well-developed and well-nourished. No distress.  HENT:  Head: Normocephalic and atraumatic.  Right Ear: External ear normal.  Left Ear: External ear normal.  Nose: Nose normal.  Mouth/Throat: Oropharynx is clear and moist.  Eyes: Conjunctivae and EOM are normal. Pupils are equal, round, and reactive to light.  Neck: Normal range of motion. Neck supple. No JVD present. No tracheal deviation present. No thyromegaly present.  Cardiovascular: Normal rate, regular rhythm, normal heart sounds and intact  distal pulses.   No murmur heard. Pulmonary/Chest: Effort normal and breath sounds normal. She has no wheezes. She exhibits no tenderness.  Abdominal: Soft. Bowel sounds are normal.  Musculoskeletal: She exhibits edema and tenderness.  Lateral epicondylitis   Lymphadenopathy:    She has no cervical adenopathy.  Neurological: She is alert and oriented to person, place, and time. She has normal reflexes. No cranial nerve deficit.  Radial neuropathy  Skin: Skin is warm and dry. She is not diaphoretic.  Psychiatric: She has a normal mood and affect. Her behavior is normal.          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

## 2013-08-23 ENCOUNTER — Ambulatory Visit (INDEPENDENT_AMBULATORY_CARE_PROVIDER_SITE_OTHER): Payer: Medicare Other | Admitting: Internal Medicine

## 2013-08-23 ENCOUNTER — Encounter: Payer: Self-pay | Admitting: Internal Medicine

## 2013-08-23 VITALS — BP 124/80 | HR 60 | Temp 98.0°F | Resp 16 | Ht 66.0 in | Wt 164.0 lb

## 2013-08-23 DIAGNOSIS — G5790 Unspecified mononeuropathy of unspecified lower limb: Secondary | ICD-10-CM

## 2013-08-23 DIAGNOSIS — Z0000 Encounter for general adult medical examination without abnormal findings: Secondary | ICD-10-CM

## 2013-08-23 DIAGNOSIS — K219 Gastro-esophageal reflux disease without esophagitis: Secondary | ICD-10-CM | POA: Diagnosis not present

## 2013-08-23 DIAGNOSIS — I10 Essential (primary) hypertension: Secondary | ICD-10-CM

## 2013-08-23 DIAGNOSIS — G250 Essential tremor: Secondary | ICD-10-CM

## 2013-08-23 DIAGNOSIS — E785 Hyperlipidemia, unspecified: Secondary | ICD-10-CM | POA: Diagnosis not present

## 2013-08-23 DIAGNOSIS — Z23 Encounter for immunization: Secondary | ICD-10-CM

## 2013-08-23 DIAGNOSIS — G252 Other specified forms of tremor: Secondary | ICD-10-CM

## 2013-08-23 DIAGNOSIS — G5630 Lesion of radial nerve, unspecified upper limb: Secondary | ICD-10-CM | POA: Diagnosis not present

## 2013-08-23 DIAGNOSIS — G5632 Lesion of radial nerve, left upper limb: Secondary | ICD-10-CM

## 2013-08-23 HISTORY — DX: Unspecified mononeuropathy of unspecified lower limb: G57.90

## 2013-08-23 LAB — BASIC METABOLIC PANEL
CO2: 29 mEq/L (ref 19–32)
Chloride: 93 mEq/L — ABNORMAL LOW (ref 96–112)
Potassium: 4.8 mEq/L (ref 3.5–5.1)
Sodium: 131 mEq/L — ABNORMAL LOW (ref 135–145)

## 2013-08-23 LAB — HEPATIC FUNCTION PANEL
ALT: 13 U/L (ref 0–35)
AST: 15 U/L (ref 0–37)
Alkaline Phosphatase: 64 U/L (ref 39–117)
Bilirubin, Direct: 0.1 mg/dL (ref 0.0–0.3)
Total Protein: 7.7 g/dL (ref 6.0–8.3)

## 2013-08-23 LAB — CBC WITH DIFFERENTIAL/PLATELET
Basophils Absolute: 0 10*3/uL (ref 0.0–0.1)
Basophils Relative: 0.4 % (ref 0.0–3.0)
Hemoglobin: 14.4 g/dL (ref 12.0–15.0)
Lymphocytes Relative: 16.8 % (ref 12.0–46.0)
Monocytes Relative: 8.9 % (ref 3.0–12.0)
Neutro Abs: 4.4 10*3/uL (ref 1.4–7.7)
Neutrophils Relative %: 72.4 % (ref 43.0–77.0)
RBC: 4.63 Mil/uL (ref 3.87–5.11)

## 2013-08-23 LAB — TSH: TSH: 1.69 u[IU]/mL (ref 0.35–5.50)

## 2013-08-23 LAB — LIPID PANEL: Total CHOL/HDL Ratio: 3

## 2013-08-23 NOTE — Assessment & Plan Note (Signed)
Stable blood pressure on current medications she continues to take Corzide 40-5 Monitoring today with a basic metabolic panel to assess creatinine and potassium on a diuretic combination

## 2013-08-23 NOTE — Patient Instructions (Signed)
The patient is instructed to continue all medications as prescribed. Schedule followup with check out clerk upon leaving the clinic  

## 2013-08-23 NOTE — Assessment & Plan Note (Signed)
She is probable left radial neuropathy An EMG and nerve conduction will be obtained the left upper extremity

## 2013-08-23 NOTE — Progress Notes (Signed)
Subjective:    Patient ID: [name], female    DOB: 04/23/1935, 78 y.o.   MRN: [id number]  HPI Patient is a 78 year old female who presents for followup of hypertension essential tremor left radial neuropathy and peripheral neuropathy she also has a left rotator cuff disease and is planning surgery.  She presents today for Medicare wellness examination as well as problem focused examination for the above problems   Review of Systems  Constitutional: Negative for activity change, appetite change and fatigue.  HENT: Negative for ear pain, congestion, neck pain, postnasal drip and sinus pressure.   Eyes: Negative for redness and visual disturbance.  Respiratory: Negative for cough, shortness of breath and wheezing.   Gastrointestinal: Negative for abdominal pain and abdominal distention.  Genitourinary: Negative for dysuria, frequency and menstrual problem.  Musculoskeletal: Positive for myalgias, joint swelling and arthralgias.  Skin: Negative for rash and wound.       No recurrence of skin rash  Neurological: Positive for tremors. Negative for dizziness, weakness and headaches.  Hematological: Negative for adenopathy. Does not bruise/bleed easily.  Psychiatric/Behavioral: Negative for sleep disturbance and decreased concentration.   Past Medical History  Diagnosis Date  . Hypertension   . Diverticulosis   . Colon polyps     Tubular adenoma and hyperplastic  . Collagenous colitis 2005  . Stricture esophagus   . Hiatal hernia   . Esophagitis   . GERD (gastroesophageal reflux disease)   . Hx: UTI (urinary tract infection)   . Heart murmur     History   Social History  . Marital Status: Married    Spouse Name: N/A    Number of Children: 2  . Years of Education: N/A   Occupational History  . Retired    Social History Main Topics  . Smoking status: Former Games developer  . Smokeless tobacco: Never Used  . Alcohol Use: 1.2 oz/week    2 Glasses of wine per week  . Drug  Use: No  . Sexual Activity: Yes   Other Topics Concern  . Not on file   Social History Narrative   Daily caffeine     Past Surgical History  Procedure Laterality Date  . Appendectomy    . Tonsillectomy    . Cataract extraction    . Moles removed    . Excision morton's neuroma      Right foot  . Mouth surgery      (For Exostosis)  . Intraocular lens implant, secondary      Family History  Problem Relation Age of Onset  . Rectal cancer Father   . Heart failure Father   . Dementia Mother   . Celiac disease Daughter   . Barrett's esophagus Son     Allergies  Allergen Reactions  . Clarithromycin     REACTION: nausea  . Erythromycin Base   . Levofloxacin     Patient has a history of a Achilles tendon tear and developed tendon pain while on medication  . Metronidazole     REACTION: Rash  . Penicillins     Current Outpatient Prescriptions on File Prior to Visit  Medication Sig Dispense Refill  . Cholecalciferol (VITAMIN D) 400 UNITS capsule Take 400 Units by mouth daily.        . fish oil-omega-3 fatty acids 1000 MG capsule Take by mouth daily.       . nadolol-bendroflumethiazide (CORZIDE) 40-5 MG per tablet Take 1 tablet by mouth daily.  90 tablet  3  .  omeprazole (PRILOSEC) 20 MG capsule Take 1 capsule (20 mg total) by mouth daily.  90 capsule  3  . Saccharomyces boulardii (FLORASTOR PO) Take by mouth. Takes as needed       No current facility-administered medications on file prior to visit.    BP 124/80  Pulse 60  Temp(Src) 98 F (36.7 C)  Resp 16  Ht 5\' 6"  (1.676 m)  Wt 164 lb (74.39 kg)  BMI 26.48 kg/m2       Objective:   Physical Exam  Nursing note and vitals reviewed. Constitutional: She is oriented to person, place, and time. She appears well-developed and well-nourished. No distress.  HENT:  Head: Normocephalic and atraumatic.  Right Ear: External ear normal.  Eyes: Conjunctivae and EOM are normal. Pupils are equal, round, and reactive to  light.  Neck: Normal range of motion. Neck supple. No JVD present. No tracheal deviation present. No thyromegaly present.  Cardiovascular: Normal rate, regular rhythm and intact distal pulses.   Murmur heard. Pulmonary/Chest: Effort normal and breath sounds normal. She has no wheezes. She exhibits no tenderness.  Abdominal: Soft. Bowel sounds are normal.  Musculoskeletal: Normal range of motion. She exhibits no edema and no tenderness.  Left rotator cuff tenderness left radial neuropathy  Lymphadenopathy:    She has no cervical adenopathy.  Neurological: She is alert and oriented to person, place, and time. She has normal reflexes. No cranial nerve deficit.  Lower extremity peripheral neuropathy  Skin: Skin is warm and dry. She is not diaphoretic.  Psychiatric: She has a normal mood and affect. Her behavior is normal.          Assessment & Plan:  We'll obtain appropriate blood work for screening for her B12 level for peripheral neuropathy and basic metabolic panel a liver panel for side effects of drugs a complete blood count and a lipid panel for screening for hyperlipidemia.  Assess thyroid panel should be drawn Subjective:    VERNETTE MOISE is a 78 y.o. female who presents for Medicare Annual/Subsequent preventive examination.  Preventive Screening-Counseling & Management  Tobacco History  Smoking status  . Former Smoker  Smokeless tobacco  . Never Used     Problems Prior to Visit 1.   Current Problems (verified) Patient Active Problem List   Diagnosis Date Noted  . Lower extremity neuropathy 08/23/2013  . Neuropathy of left radial nerve 07/05/2013  . ACHILLES TENDON TEAR 01/17/2011  . DYSPHAGIA 03/16/2010  . COLONIC POLYPS, ADENOMATOUS, HX OF 03/10/2010  . HYPONATREMIA 02/08/2010  . HYPERLIPIDEMIA, MILD 01/07/2010  . ADVERSE DRUG REACTION 07/16/2009  . GASTROESOPHAGEAL REFLUX DISEASE 06/19/2009  . ACNE ROSACEA 04/03/2008  . TREMOR, ESSENTIAL 11/16/2007  .  HYPERTENSION 11/16/2007  . DIVERTICULOSIS, COLON 11/16/2007  . COLLAGENOUS COLITIS 11/16/2007    Medications Prior to Visit Current Outpatient Prescriptions on File Prior to Visit  Medication Sig Dispense Refill  . Cholecalciferol (VITAMIN D) 400 UNITS capsule Take 400 Units by mouth daily.        . fish oil-omega-3 fatty acids 1000 MG capsule Take by mouth daily.       . nadolol-bendroflumethiazide (CORZIDE) 40-5 MG per tablet Take 1 tablet by mouth daily.  90 tablet  3  . omeprazole (PRILOSEC) 20 MG capsule Take 1 capsule (20 mg total) by mouth daily.  90 capsule  3  . Saccharomyces boulardii (FLORASTOR PO) Take by mouth. Takes as needed       No current facility-administered medications on file prior to visit.  Current Medications (verified) Current Outpatient Prescriptions  Medication Sig Dispense Refill  . Cyanocobalamin (B-12) 5000 MCG SUBL Place 5,000 mcg under the tongue daily.      . magnesium 30 MG tablet Take 300 mg by mouth daily.      . Cholecalciferol (VITAMIN D) 400 UNITS capsule Take 400 Units by mouth daily.        . fish oil-omega-3 fatty acids 1000 MG capsule Take by mouth daily.       . nadolol-bendroflumethiazide (CORZIDE) 40-5 MG per tablet Take 1 tablet by mouth daily.  90 tablet  3  . omeprazole (PRILOSEC) 20 MG capsule Take 1 capsule (20 mg total) by mouth daily.  90 capsule  3  . Saccharomyces boulardii (FLORASTOR PO) Take by mouth. Takes as needed       No current facility-administered medications for this visit.     Allergies (verified) Clarithromycin; Erythromycin base; Levofloxacin; Metronidazole; and Penicillins   PAST HISTORY  Family History Family History  Problem Relation Age of Onset  . Rectal cancer Father   . Heart failure Father   . Dementia Mother   . Celiac disease Daughter   . Barrett's esophagus Son     Social History History  Substance Use Topics  . Smoking status: Former Games developer  . Smokeless tobacco: Never Used  . Alcohol  Use: 1.2 oz/week    2 Glasses of wine per week     Are there smokers in your home (other than you)? No  Risk Factors Current exercise habits: Home exercise routine includes walking 1 hrs per day.  Dietary issues discussed: none   Cardiac risk factors: dyslipidemia and hypertension.  Depression Screen (Note: if answer to either of the following is "Yes", a more complete depression screening is indicated)   Over the past two weeks, have you felt down, depressed or hopeless? no  Over the past two weeks, have you felt little interest or pleasure in doing things? no  Have you lost interest or pleasure in daily life? no  Do you often feel hopeless? No  Do you cry easily over simple problems? No  Activities of Daily Living In your present state of health, do you have any difficulty performing the following activities?:  Driving? No Managing money?  No Feeding yourself? No Getting from bed to chair? No Climbing a flight of stairs? No Preparing food and eating?: No Bathing or showering? No Getting dressed: No Getting to the toilet? No Using the toilet:No Moving around from place to place: No In the past year have you fallen or had a near fall?:No   Are you sexually active?  Yes  Do you have more than one partner?  No  Hearing Difficulties: No Do you often ask people to speak up or repeat themselves? No Do you experience ringing or noises in your ears? No Do you have difficulty understanding soft or whispered voices? No   Do you feel that you have a problem with memory? No  Do you often misplace items? No  Do you feel safe at home?  Yes  Cognitive Testing  Alert? Yes  Normal Appearance?Yes  Oriented to person? Yes  Place? Yes   Time? Yes  Recall of three objects?  Yes  Can perform simple calculations? Yes  Displays appropriate judgment?Yes  Can read the correct time from a watch face?Yes   Advanced Directives have been discussed with the patient? Yes  List the Names of  Other Physician/Practitioners you currently use: 1.  Indicate any recent Medical Services you may have received from other than [facility] providers in the past year (date may be approximate).  Immunization History  Administered Date(s) Administered  . Influenza Split 12/01/2011, 11/05/2012  . Influenza Whole 01/07/2010, 10/08/2010  . Pneumococcal Polysaccharide 12/26/2005  . Td 12/26/2001  . Tdap 01/23/2012  . Zoster 02/08/2010    Screening Tests Health Maintenance  Topic Date Due  . Influenza Vaccine  07/26/2013  . Colonoscopy  09/30/2019  . Tetanus/tdap  01/22/2022  . Pneumococcal Polysaccharide Vaccine Age 28 And Over  Completed  . Zostavax  Completed    All answers were reviewed with the patient and necessary referrals were made:  Carrie Mew, MD   08/23/2013   History reviewed: allergies, current medications, past family history, past medical history, past social history, past surgical history and problem list  Review of Systems Pertinent items are noted in HPI.    Objective:     Vision by Snellen chart: right eye:20/20, left eye:20/20  Body mass index is 26.48 kg/(m^2). BP 124/80  Pulse 60  Temp(Src) 98 F (36.7 C)  Resp 16  Ht 5\' 6"  (1.676 m)  Wt 164 lb (74.39 kg)  BMI 26.48 kg/m2  BP 124/80  Pulse 60  Temp(Src) 98 F (36.7 C)  Resp 16  Ht 5\' 6"  (1.676 m)  Wt 164 lb (74.39 kg)  BMI 26.48 kg/m2  General Appearance:    Alert, cooperative, no distress, appears stated age  Head:    Normocephalic, without obvious abnormality, atraumatic  Eyes:    PERRL, conjunctiva/corneas clear, EOM's intact, fundi    benign, both eyes  Ears:    Normal TM's and external ear canals, both ears  Nose:   Nares normal, septum midline, mucosa normal, no drainage    or sinus tenderness  Throat:   Lips, mucosa, and tongue normal; teeth and gums normal  Neck:   Supple, symmetrical, trachea midline, no adenopathy;    thyroid:  no enlargement/tenderness/nodules; no  carotid   bruit or JVD  Back:     Symmetric, no curvature, ROM normal, no CVA tenderness  Lungs:     Clear to auscultation bilaterally, respirations unlabored  Chest Wall:    No tenderness or deformity   Heart:    Regular rate and rhythm, S1 and S2 normal, 2/6 murmur, rub   or gallop  Breast Exam:    No tenderness, masses, or nipple abnormality  Abdomen:     Soft, non-tender, bowel sounds active all four quadrants,    no masses, no organomegaly  Genitalia:    Normal female without lesion, discharge or tenderness  Rectal:    Normal tone, normal prostate, no masses or tenderness;   guaiac negative stool  Extremities:   Extremities normal, atraumatic, no cyanosis or edema  Pulses:   2+ and symmetric all extremities  Skin:   Skin color, texture, turgor normal, no rashes or lesions  Lymph nodes:   Cervical, supraclavicular, and axillary nodes normal  Neurologic:   CNII-XII intact, normal strength, sensation and reflexes    Throughout neuropathy of LE bilaterally       Assessment:      This is a routine physical examination for this healthy  Female. Reviewed all health maintenance protocols including mammography colonoscopy bone density and reviewed appropriate screening labs. Her immunization history was reviewed as well as her current medications and allergies refills of her chronic medications were given and the plan for yearly health maintenance was discussed all orders  and referrals were made as appropriate.      Plan:     During the course of the visit the patient was educated and counseled about appropriate screening and preventive services including:    Influenza vaccine  Diet review for nutrition referral? Yes ____  Not Indicated ____x   Patient Instructions (the written plan) was given to the patient.  Medicare Attestation I have personally reviewed: The patient's medical and social history Their use of alcohol, tobacco or illicit drugs Their current medications and  supplements The patient's functional ability including ADLs,fall risks, home safety risks, cognitive, and hearing and visual impairment Diet and physical activities Evidence for depression or mood disorders  The patient's weight, height, BMI, and visual acuity have been recorded in the chart.  I have made referrals, counseling, and provided education to the patient based on review of the above and I have provided the patient with a written personalized care plan for preventive services.     Carrie Mew, MD   08/23/2013

## 2013-08-23 NOTE — Assessment & Plan Note (Signed)
Stable benign essential tremor

## 2013-08-23 NOTE — Assessment & Plan Note (Signed)
She is stable with a low dose of Prilosec 20 mg approximately every other day Mild dysphagia to indicate that she needs to take Prilosec daily

## 2013-09-02 ENCOUNTER — Encounter: Payer: Self-pay | Admitting: Neurology

## 2013-09-02 ENCOUNTER — Ambulatory Visit (INDEPENDENT_AMBULATORY_CARE_PROVIDER_SITE_OTHER): Payer: Medicare Other | Admitting: Neurology

## 2013-09-02 VITALS — BP 132/78 | HR 60 | Temp 98.1°F | Resp 16 | Ht 65.75 in | Wt 165.0 lb

## 2013-09-02 DIAGNOSIS — G609 Hereditary and idiopathic neuropathy, unspecified: Secondary | ICD-10-CM | POA: Diagnosis not present

## 2013-09-02 LAB — T3, FREE: T3, Free: 2.6 pg/mL (ref 2.3–4.2)

## 2013-09-02 NOTE — Patient Instructions (Addendum)
We will get labs today and schedule you for an EMG next week and have you come back in one month for a follow up.

## 2013-09-02 NOTE — Progress Notes (Signed)
[facility] Note - Initial Visit   Date: September 02, 2013   Adriana Hendricks MRN: [id number] DOB: 25-Nov-1935  Dear Dr Lovell Sheehan:  Thank you for your kind referral of Adriana Hendricks for consultation of sensory changes of her right hand. Although her history is well known to you, please allow [place] to reiterate it for the purpose of our medical record. The patient was accompanied to the clinic by husband.   History of Present Illness: Adriana Hendricks is a 78 y.o. year-old right-handed Caucasian female with history of GERD and hypertension presenting for evaluation of Adriana hand sensory changes.    Since February 2013, she had experienced Adriana shoulder pain and reduced range of motion and was later found to have a partial Adriana rotator cuff tear.  She was doing physical therapy and in March 2014 (last two sessions), she was stretching and strengthening internal rotation of the upper arm with green rubberbands (highest tensile strength).  By the end of the second session, she developed acute onset of Hendricks involving the medial thumb and lateral index finger.  Symptoms are more intense and more constant now.  Hendricks does not radiate into the palmer/dorsal surface of the hand or involve the middle finger.  There is rare tingling of the Adriana hand.  Symptoms are worse when she wakes up.  She has tried a wrist brace and head/ice which did not help.  There is associated Adriana hand weakness, such as when opening jars.  She also has has chronic shoulder pain, which limits her strength so is unsure if that is contributing to her weakness.  She is having Adriana rotator cuff surgery next week and was referred by her PCP for evaluation of her Adriana hand symptoms.  Her recent labs including HbA1c, TSH, and vitamin B12 is normal.  Denies any neck pain.  There is also history of bilateral feet Hendricks which has been present for a number of years and gait unsteadiness.   Past Medical History  Diagnosis Date   . Hypertension   . Diverticulosis   . Colon polyps     Tubular adenoma and hyperplastic  . Collagenous colitis 2005  . Stricture esophagus   . Hiatal hernia   . Esophagitis   . GERD (gastroesophageal reflux disease)   . Hx: UTI (urinary tract infection)   . Heart murmur     Past Surgical History  Procedure Laterality Date  . Appendectomy    . Tonsillectomy    . Cataract extraction    . Moles removed    . Excision morton's neuroma      Right foot  . Mouth surgery      (For Exostosis)  . Intraocular lens implant, secondary       Medications:  Current Outpatient Prescriptions on File Prior to Visit  Medication Sig Dispense Refill  . Cholecalciferol (VITAMIN D) 400 UNITS capsule Take 400 Units by mouth daily.        . Cyanocobalamin (B-12) 5000 MCG SUBL Place 5,000 mcg under the tongue daily.      . fish oil-omega-3 fatty acids 1000 MG capsule Take by mouth daily.       . magnesium 30 MG tablet Take 300 mg by mouth daily.      . nadolol-bendroflumethiazide (CORZIDE) 40-5 MG per tablet Take 1 tablet by mouth daily.  90 tablet  3  . omeprazole (PRILOSEC) 20 MG capsule Take 1 capsule (20 mg total) by mouth daily.  90 capsule  3  . Saccharomyces boulardii (FLORASTOR PO) Take by mouth. Takes as needed       No current facility-administered medications on file prior to visit.    Allergies:  Allergies  Allergen Reactions  . Clarithromycin     REACTION: nausea  . Erythromycin Base   . Levofloxacin     Patient has a history of a Achilles tendon tear and developed tendon pain while on medication  . Metronidazole     REACTION: Rash  . Penicillins     Family History: Family History  Problem Relation Age of Onset  . Rectal cancer Father   . Heart failure Father     Died, 55  . Dementia Mother     Died, 58  . Barrett's esophagus Son     Social History: History   Social History  . Marital Status: Married    Spouse Name: N/A    Number of Children: 2  . Years of  Education: N/A   Occupational History  . Retired    Social History Main Topics  . Smoking status: Former Games developer  . Smokeless tobacco: Never Used     Comment: Quit in 1969  . Alcohol Use: 1.2 oz/week    2 Glasses of wine per week     Comment: 2 glasses of wine 4-6 nights per week  . Drug Use: No  . Sexual Activity: Yes   Other Topics Concern  . Not on file   Social History Narrative   Retired from [place] [employer] of [employer] urban development.  Lives with husband of 56 years in a one-story home.                Review of Systems:  CONSTITUTIONAL: No fevers, chills, night sweats, or weight loss.   EYES: No visual changes or eye pain ENT: No hearing changes.  No history of nose bleeds.   RESPIRATORY: No cough, wheezing and shortness of breath.   CARDIOVASCULAR: Negative for chest pain, and palpitations.   GI: Negative for abdominal discomfort, blood in stools or black stools.  No recent change in bowel habits.   GU:  No history of incontinence.   MUSCLOSKELETAL: + joint pain, no swelling.  No myalgias.   SKIN: Negative for lesions, rash, and itching.   HEMATOLOGY/ONCOLOGY: Negative for prolonged bleeding, bruising easily, and swollen nodes.  No history of cancer.   ENDOCRINE: Negative for cold or heat intolerance, polydipsia or goiter.   PSYCH:  No depression or anxiety symptoms.   NEURO: As Above.   Vital Signs:  BP 132/78  Pulse 60  Temp(Src) 98.1 F (36.7 C) (Oral)  Resp 16  Ht 5' 5.75" (1.67 m)  Wt 165 lb (74.844 kg)  BMI 26.84 kg/m2   General Medical Exam: General:  Well appearing, comfortable.   Eyes/ENT: see cranial nerve examination.   Neck: No masses appreciated.  Full range of motion without tenderness.   Respiratory:   good air entry bilaterally.   Cardiac:  Regular rate  GI:  Soft, non-tender, non-distended abdomen.   Extremities:  No deformities, edema, or skin discoloration. Good capillary refill.   Skin:  Skin color, texture, turgor normal. No  rashes or lesions.  Neurological Exam: MENTAL STATUS including orientation to time, place, person, recent and remote memory, attention span and concentration, language, and fund of knowledge is normal.  Speech is not dysarthric.  CRANIAL NERVES: II:  No visual field defects.  Unremarkable fundi.   III-IV-VI: Pupils equal round and reactive  to light.  Normal conjugate, extra-ocular eye movements in all directions of gaze.  No nystagmus.  No ptosis prior to or post sustained upgaze.   V:  Normal facial sensation.   VII:  Normal facial symmetry and movements.  VIII:  Normal hearing and vestibular function.   IX-X:  Normal palatal movement.   XI:  Normal shoulder shrug and head rotation.   XII:  Normal tongue strength and range of motion, no deviation or fasciculation.  MOTOR:  Mild atrophy of the Adriana APB.  No fasciculations or abnormal movements.  No pronator drift.  Tone is normal.    Right Upper Extremity:    Adriana Upper Extremity:    Deltoid  5/5   Deltoid  5/5   Biceps  5/5   Biceps  5/5   Triceps  5/5   Triceps  5/5   Wrist extensors  5/5   Wrist extensors  5/5   Wrist flexors  5/5   Wrist flexors  5/5   Finger extensors  5/5   Finger extensors  5/5   Finger flexors  5/5   Finger flexors  5/5   Dorsal interossei  5/5   Dorsal interossei  5/5   Abductor pollicis  5/5   Abductor pollicis  4/5   Tone (Ashworth scale)  0  Tone (Ashworth scale)  0   Right Lower Extremity:    Adriana Lower Extremity:    Hip flexors  5/5   Hip flexors  5/5   Hip extensors  5/5   Hip extensors  5/5   Knee flexors  5/5   Knee flexors  5/5   Knee extensors  5/5   Knee extensors  5/5   Dorsiflexors  5/5   Dorsiflexors  5/5   Plantarflexors  5/5   Plantarflexors  5/5   Toe extensors  5/5   Toe extensors  5/5   Toe flexors  5/5   Toe flexors  5/5   Tone (Ashworth scale)  0  Tone (Ashworth scale)  0   MSRs:  Right                                                                 Adriana brachioradialis 3+   brachioradialis 3+  biceps 3+  biceps 3+  triceps 3+  triceps 3+  patellar 3+  patellar 3+  ankle jerk trace  ankle jerk trace  Hoffman no  Hoffman no  plantar response down  plantar response down  Crossed adductors present.  No clonus.  SENSORY:  Reduced pinprick and light touch over the Adriana medial thumb and letter removed index finger. Sensation of the remainder of the arm, forearm, and hand is intact.  Vibration is absent at the MCP bilaterally.  Light touch, pinprick, and proprioception of the lower extremities is intact.  Romberg's sign present.  Negative Tinel's sign over the wrist.   COORDINATION/GAIT: Normal finger-to- nose-finger and heel-to-shin.  Intact rapid alternating movements bilaterally.  Able to rise from a chair without using arms.  Gait narrow based and stable.  Unsteady with tandem gait intact.    IMPRESSION: Ms. Wiater is a 78 year old female who presents with 6 month history of Adriana Hendricks localized to the medial border of the thumb and  lateral border of the index finger. On examination, there is sensory loss to pinprick over this distribution and weakness with atrophy of the abductor pollicis brevis.  Based on history and physical examination, her findings are best localized to a Adriana median neuropathy. C6 radiculopathy could explain sensory symptoms however weakness of the APB would not be seen. Reflexes are brisk, throughout and do no appear pathologic. I will order and EMG to better localize and characterize her symptoms.  Etiology is unclear, however is it possible that there was repetitive hyper flexion of her wrist during the internal rotation exercises.  Additionally, she also has a distal large fiber predominant peripheral neuropathy, for which I will check routine labs for reversible and treatable causes.  Symptomatic treatment was discussed, but since Hendricks is the predominant symptom there very little therapy that can be offered. Should positive symptoms such  as dysesthesias or tingling be more bothersome, I would be happy to initiate medications as needed.    PLAN/RECOMMENDATIONS:  1.  Labs as follows: Orders Placed This Encounter  Procedures  . Protein electrophoresis, serum  . IFE, Urine (with Tot Prot)    Order Specific Question:  Collection Interval    Answer:  random  . Immunofixation electrophoresis  . Ceruloplasmin  . Copper, serum  . T4, free  . T3, free  . Immunofixation electrophoresis  2.  EMG of the Adriana upper extremity 3.  Return to clinic in 75-month.  The duration of this appointment visit was 60 minutes of face-to-face time with the patient.  At least 50% of this time was spent in counseling, explanation of diagnosis, planning of further management, and coordination of care.   Thank you for allowing me to participate in patient's care.  If I can answer any additional questions, I would be pleased to do so.    Sincerely,    Leana Springston K. Allena Katz, DO

## 2013-09-03 LAB — CERULOPLASMIN: Ceruloplasmin: 43 mg/dL (ref 20–60)

## 2013-09-04 ENCOUNTER — Ambulatory Visit (INDEPENDENT_AMBULATORY_CARE_PROVIDER_SITE_OTHER): Payer: Medicare Other | Admitting: Neurology

## 2013-09-04 ENCOUNTER — Encounter: Payer: Self-pay | Admitting: Neurology

## 2013-09-04 DIAGNOSIS — G5600 Carpal tunnel syndrome, unspecified upper limb: Secondary | ICD-10-CM

## 2013-09-04 DIAGNOSIS — G5602 Carpal tunnel syndrome, left upper limb: Secondary | ICD-10-CM

## 2013-09-04 HISTORY — DX: Carpal tunnel syndrome, unspecified upper limb: G56.00

## 2013-09-04 LAB — PROTEIN ELECTROPHORESIS, SERUM
Albumin ELP: 58.3 % (ref 55.8–66.1)
Alpha-1-Globulin: 5 % — ABNORMAL HIGH (ref 2.9–4.9)
Alpha-2-Globulin: 11.1 % (ref 7.1–11.8)
Beta 2: 5.1 % (ref 3.2–6.5)
Gamma Globulin: 13.7 % (ref 11.1–18.8)

## 2013-09-04 LAB — IMMUNOFIXATION ELECTROPHORESIS
IgA: 191 mg/dL (ref 69–380)
IgA: 198 mg/dL (ref 69–380)
IgG (Immunoglobin G), Serum: 927 mg/dL (ref 690–1700)
IgG (Immunoglobin G), Serum: 944 mg/dL (ref 690–1700)
IgM, Serum: 105 mg/dL (ref 52–322)
Total Protein, Serum Electrophoresis: 7.5 g/dL (ref 6.0–8.3)
Total Protein, Serum Electrophoresis: 7.5 g/dL (ref 6.0–8.3)

## 2013-09-04 NOTE — Progress Notes (Signed)
See procedure note for EMG results.  Martia Dalby K. Evanna Washinton, DO  

## 2013-09-04 NOTE — Procedures (Signed)
[facility]  [address] [place], [address]  [place], [place] [address] Tel: [phone] Fax:  [phone] Test Date:  09/04/2013  Patient: Adriana Hendricks DOB: [birth year] Physician: Nita Sickle, DO  Sex: Female Height: 5\' 5"  Ref Phys:   ID#: [id number]       Patient Complaints: 78 year old female presenting with left thumb and index finger numbness over the past 59-months.  NCV & EMG Findings: Evaluation of the left median sensory nerve showed prolonged distal peak latency (4.3 ms).  The left median/ulnar (palm) comparison nerve showed prolonged distal peak latency (Median Palm, 2.3 ms).  All remaining nerves (as indicated in the following tables) were within normal limits.    Needle electrode examination of tested muscles on the left upper extremity is normal.  No evidence of active or chronic motor axon loss.  Impression: Left median neuropathy at or distal to the wrist (consistent with carpal tunnel syndrome), mild in degree electrically, based the prolongation of the median sensory distal latencies.   ___________________________ Nita Sickle, DO    Nerve Conduction Studies   Temp: 31.6 Anti Sensory Summary Table   Site NR Peak (ms) Norm Peak (ms) P-T Amp (V) Norm P-T Amp  Left Median Anti Sensory (2nd Digit)  Wrist    4.3 <3.8 49.5 >10  Left Radial Anti Sensory (Base 1st Digit)  Wrist    2.6 <2.8 41.2 >10  Left Ulnar Anti Sensory (5th Digit)  Wrist    2.9 <3.2 30.8 >5   Motor Summary Table   Site NR Onset (ms) Norm Onset (ms) O-P Amp (mV) Norm O-P Amp Site1 Site2 Delta-0 (ms) Dist (cm) Vel (m/s) Norm Vel (m/s)  Left Median Motor (Abd Poll Brev)  Wrist    3.9 <4.0 6.6 >5 Elbow Wrist 6.0 30.0 50 >50  Elbow    9.9  6.5         Left Ulnar Motor (Abd Dig Minimi)  Wrist    2.8 <3.1 10.9 >7 B Elbow Wrist 3.8 0.0  >50  B Elbow    6.6  10.4  A Elbow B Elbow 1.9 0.0  >50  A Elbow    8.5  10.1          Comparison Summary Table   Site NR Peak (ms) Norm Peak (ms) P-T  Amp (V) Site1 Site2 Delta-P (ms) Norm Delta (ms)  Left Median/Ulnar Palm Comparison (Wrist - 8cm)  Median Palm    2.3 <2.2 43.4 Median Palm Ulnar Palm 0.1   Ulnar Palm    2.2 <2.2 26.8       EMG   Side Muscle Ins Act Fibs Psw Fasc Number Recrt Dur Dur. Amp Amp. Poly Poly. Comment  Left 1stDorInt Nml Nml Nml Nml Nml Nml Nml Nml Nml Nml Nml Nml N/A  Left Abd Poll Brev Nml Nml Nml Nml Nml Nml Nml Nml Nml Nml Nml Nml N/A  Left FlexPolLong Nml Nml Nml Nml Nml Nml Nml Nml Nml Nml Nml Nml N/A  Left Ext Indicis Nml Nml Nml Nml Nml Nml Nml Nml Nml Nml Nml Nml N/A  Left PronatorTeres Nml Nml Nml Nml Nml Nml Nml Nml Nml Nml Nml Nml N/A  Left Biceps Nml Nml Nml Nml Nml Nml Nml Nml Nml Nml Nml Nml N/A  Left Triceps Nml Nml Nml Nml Nml Nml Nml Nml Nml Nml Nml Nml N/A  Left Deltoid Nml Nml Nml Nml Nml Nml Nml Nml Nml Nml Nml Nml N/A  Waveforms:

## 2013-09-05 LAB — UIFE/LIGHT CHAINS/TP QN, 24-HR UR
Albumin, U: DETECTED
Alpha 1, Urine: DETECTED — AB
Alpha 2, Urine: DETECTED — AB
Free Kappa Lt Chains,Ur: 0.49 mg/dL (ref 0.14–2.42)
Free Kappa/Lambda Ratio: 9.8 ratio (ref 2.04–10.37)
Total Protein, Urine: 1 mg/dL

## 2013-09-06 DIAGNOSIS — M25519 Pain in unspecified shoulder: Secondary | ICD-10-CM | POA: Diagnosis not present

## 2013-09-06 DIAGNOSIS — G5620 Lesion of ulnar nerve, unspecified upper limb: Secondary | ICD-10-CM | POA: Diagnosis not present

## 2013-09-10 DIAGNOSIS — M75110 Incomplete rotator cuff tear or rupture of unspecified shoulder, not specified as traumatic: Secondary | ICD-10-CM | POA: Diagnosis not present

## 2013-09-10 DIAGNOSIS — M75120 Complete rotator cuff tear or rupture of unspecified shoulder, not specified as traumatic: Secondary | ICD-10-CM | POA: Diagnosis not present

## 2013-09-10 DIAGNOSIS — G5600 Carpal tunnel syndrome, unspecified upper limb: Secondary | ICD-10-CM | POA: Diagnosis not present

## 2013-09-10 DIAGNOSIS — M67919 Unspecified disorder of synovium and tendon, unspecified shoulder: Secondary | ICD-10-CM | POA: Diagnosis not present

## 2013-09-10 DIAGNOSIS — Z79899 Other long term (current) drug therapy: Secondary | ICD-10-CM | POA: Diagnosis not present

## 2013-09-10 DIAGNOSIS — I10 Essential (primary) hypertension: Secondary | ICD-10-CM | POA: Diagnosis not present

## 2013-09-10 DIAGNOSIS — E785 Hyperlipidemia, unspecified: Secondary | ICD-10-CM | POA: Diagnosis not present

## 2013-09-24 ENCOUNTER — Encounter: Payer: Self-pay | Admitting: Neurology

## 2013-09-26 ENCOUNTER — Ambulatory Visit: Payer: Medicare Other | Attending: Orthopedic Surgery | Admitting: Physical Therapy

## 2013-09-26 DIAGNOSIS — IMO0001 Reserved for inherently not codable concepts without codable children: Secondary | ICD-10-CM | POA: Insufficient documentation

## 2013-09-26 DIAGNOSIS — R5381 Other malaise: Secondary | ICD-10-CM | POA: Insufficient documentation

## 2013-09-26 DIAGNOSIS — M25619 Stiffness of unspecified shoulder, not elsewhere classified: Secondary | ICD-10-CM | POA: Diagnosis not present

## 2013-09-26 DIAGNOSIS — M7989 Other specified soft tissue disorders: Secondary | ICD-10-CM | POA: Diagnosis not present

## 2013-09-26 DIAGNOSIS — M25519 Pain in unspecified shoulder: Secondary | ICD-10-CM | POA: Diagnosis not present

## 2013-09-30 ENCOUNTER — Ambulatory Visit: Payer: Medicare Other | Admitting: Physical Therapy

## 2013-10-01 ENCOUNTER — Ambulatory Visit: Payer: Medicare Other | Admitting: Physical Therapy

## 2013-10-02 ENCOUNTER — Encounter: Payer: Self-pay | Admitting: Neurology

## 2013-10-02 ENCOUNTER — Ambulatory Visit (INDEPENDENT_AMBULATORY_CARE_PROVIDER_SITE_OTHER): Payer: Medicare Other | Admitting: Neurology

## 2013-10-02 VITALS — BP 120/70 | HR 56 | Temp 98.2°F | Resp 12 | Wt 161.8 lb

## 2013-10-02 DIAGNOSIS — G609 Hereditary and idiopathic neuropathy, unspecified: Secondary | ICD-10-CM

## 2013-10-02 NOTE — Progress Notes (Signed)
[facility]  Follow-up Visit   Date: 10/02/2013   Adriana Hendricks MRN: [id number] DOB: [birth year]   Interim History: Adriana Hendricks is a 78 y.o. year-old right-handed Caucasian female with history of GERD and hypertension returning to the clinic for follow-up of left hand sensory changes.  The patient was accompanied to the clinic by her husband.  She was last seen in the clinic on 09/02/2013.  She had an EMG on 9/10 which showed mild left carpal tunnel syndrome.  Since then, she underwent left rotator cuff repair and left carpal tunnel release on 9/16.  She currently has the left arm in a sling and has started physical therapy.  She has noticed 80% improvement of numbness/tingling of her thumb and index finger.  She remains weak in the hand and has pain because the incision is still healing.  There is also history of bilateral feet numbness which has been present for a number of years and gait unsteadiness.  History of present illness: Since February 2013, she had experienced left shoulder pain and reduced range of motion and was later found to have a partial left rotator cuff tear. She was doing physical therapy and in March 2014 (last two sessions), she was stretching and strengthening internal rotation of the upper arm with green rubberbands (highest tensile strength). By the end of the second session, she developed acute onset of numbness involving the medial thumb and lateral index finger. Symptoms are more intense and more constant now. Numbness does not radiate into the palmer/dorsal surface of the hand or involve the middle finger. There is rare tingling of the left hand. Symptoms are worse when she wakes up. She has tried a wrist brace and head/ice which did not help. There is associated left hand weakness, such as when opening jars. She also has has chronic shoulder pain, which limits her strength so is unsure if that is contributing to her weakness .     Medications:  Current Outpatient Prescriptions on File Prior to Visit  Medication Sig Dispense Refill  . Cholecalciferol (VITAMIN D) 400 UNITS capsule Take 400 Units by mouth daily.        . Cyanocobalamin (B-12) 5000 MCG SUBL Place 5,000 mcg under the tongue daily.      . fish oil-omega-3 fatty acids 1000 MG capsule Take by mouth daily.       . magnesium 30 MG tablet Take 300 mg by mouth daily.      . nadolol-bendroflumethiazide (CORZIDE) 40-5 MG per tablet Take 1 tablet by mouth daily.  90 tablet  3  . omeprazole (PRILOSEC) 20 MG capsule Take 1 capsule (20 mg total) by mouth daily.  90 capsule  3  . Saccharomyces boulardii (FLORASTOR PO) Take by mouth. Takes as needed       No current facility-administered medications on file prior to visit.    Allergies:  Allergies  Allergen Reactions  . Clarithromycin     REACTION: nausea  . Erythromycin Base   . Levofloxacin     Patient has a history of a Achilles tendon tear and developed tendon pain while on medication  . Metronidazole     REACTION: Rash  . Penicillins      Review of Systems:  CONSTITUTIONAL: No fevers, chills, night sweats, or weight loss.   EYES: No visual changes or eye pain ENT: No hearing changes.  No history of nose bleeds.   RESPIRATORY: No cough, wheezing and shortness of breath.   CARDIOVASCULAR:  Negative for chest pain, and palpitations.   GI: Negative for abdominal discomfort, blood in stools or black stools.  No recent change in bowel habits.   GU:  No history of incontinence.   MUSCLOSKELETAL: No history of joint pain or swelling.  No myalgias.   SKIN: Negative for lesions, rash, and itching.   HEMATOLOGY/ONCOLOGY: Negative for prolonged bleeding, bruising easily, and swollen nodes.  ENDOCRINE: Negative for cold or heat intolerance, polydipsia or goiter.   PSYCH:  No depression or anxiety symptoms.   NEURO: As Above.   Vital Signs:  BP 120/70  Pulse 56  Temp(Src) 98.2 F (36.8 C)  Resp 12   Wt 161 lb 12.8 oz (73.392 kg)  BMI 26.32 kg/m2   Neurological Exam: MENTAL STATUS including orientation to time, place, person, recent and remote memory, attention span and concentration, language, and fund of knowledge is normal.  Speech is not dysarthric.  CRANIAL NERVES: II:  No visual field defects.  Unremarkable fundi.   III-IV-VI: Pupils equal round and reactive to light.  Normal conjugate, extra-ocular eye movements in all directions of gaze.  No nystagmus.  No ptosis prior to or post sustained upgaze.   V:  Normal facial sensation.   VII:  Normal facial symmetry and movements.   VIII:  Normal hearing and vestibular function.   IX-X:  Normal palatal movement.   XI:  Normal shoulder shrug and head rotation.   XII:  Normal tongue strength and range of motion, no deviation or fasciculation.  MOTOR:  *left arm is supported in a sling due to recent surgery, so motor and reflex testing was limited.  She is able to wiggle her hands, finger flexion and extension seems 5/5.  ABP is 4+/5  Right Upper Extremity:       Deltoid  5/5      Biceps  5/5      Triceps  5/5      Wrist extensors  5/5      Wrist flexors  5/5      Finger extensors  5/5      Finger flexors  5/5      Dorsal interossei  5/5      Abductor pollicis  5/5      Tone (Ashworth scale)  0      Right Lower Extremity:    Left Lower Extremity:    Hip flexors  5/5   Hip flexors  5/5   Hip extensors  5/5   Hip extensors  5/5   Knee flexors  5/5   Knee flexors  5/5   Knee extensors  5/5   Knee extensors  5/5   Dorsiflexors  5/5   Dorsiflexors  5/5   Plantarflexors  5/5   Plantarflexors  5/5   Toe extensors  5/5   Toe extensors  5/5   Toe flexors  5/5   Toe flexors  5/5   Tone (Ashworth scale)  0  Tone (Ashworth scale)  0   MSRs:   Right      Left  brachioradialis  3+   brachioradialis  Not checked*  biceps  3+   biceps  Not checked*  triceps  3+   triceps  Not checked*  patellar  3+   patellar  3+   ankle jerk  trace    ankle jerk  trace   Hoffman  no   Hoffman  no   plantar response  down   plantar response  down   Crossed  adductors present. No clonus.   SENSORY: Normal and symmetric pin prick, vibration, and light touch in the upper extremities (improved). Vibration is absent at the MCP bilaterally. Light touch, pinprick, and proprioception of the lower extremities is intact. Romberg's sign present. Negative Tinel's sign over the wrist.   COORDINATION/GAIT: Normal finger-to- nose-finger and heel-to-shin. Intact rapid alternating movements bilaterally. Able to rise from a chair without using arms. Gait narrow based and stable. Unsteady with tandem gait intact.   Data Component     Latest Ref Rng 07/05/2013 08/23/2013 09/02/2013  Vitamin B-12     211 - 911 pg/mL 1225 (H)    Hemoglobin A1C     4.6 - 6.5 % 6.0    TSH     0.35 - 5.50 uIU/mL  1.69   Ceruloplasmin     20 - 60 mg/dL   43  Copper     70 - 161 mcg/dL   096  Free T4     0.45 - 1.60 ng/dL   4.09  T3, Free     2.3 - 4.2 pg/mL   2.6     IMPRESSION:  Ms. Croston is a 78 year old female who presents for follow-up of left hand numbness localized to the medial border of the thumb and lateral border of the index finger.  Her work-up revealed mild left carpal tunnel syndrome and she underwent release on 9/16 with improved sensory symptoms.  There is no sensory loss over the left thumb or index finger on today's exam.  She has a distal large fiber predominant peripheral neuropathy of unclear etiology.  Her labs have been unrevealing for treatable causes of neuropathy.  Given that diabetes is the most common cause of neuropathy, I will check 2hr glucose tolerance test. Symptomatic treatment was discussed, but since numbness is the predominant symptom there very little therapy that can be offered. Should positive symptoms such as dysesthesias or tingling be more bothersome, I would be happy to initiate medications as needed.    PLAN/RECOMMENDATIONS:  1.  2-hr  glucose tolerance test 2.  Return to clinic in 36-months, or sooner as needed   The duration of this appointment visit was 30 minutes of face-to-face time with the patient.  Greater than 50% of this time was spent in counseling, explanation of diagnosis, planning of further management, and coordination of care.   Thank you for allowing me to participate in patient's care.  If I can answer any additional questions, I would be pleased to do so.    Sincerely,    Jaykub Mackins K. Allena Katz, DO

## 2013-10-03 ENCOUNTER — Ambulatory Visit: Payer: Medicare Other | Admitting: Physical Therapy

## 2013-10-07 ENCOUNTER — Ambulatory Visit: Payer: Medicare Other | Admitting: Physical Therapy

## 2013-10-10 ENCOUNTER — Ambulatory Visit: Payer: Medicare Other | Admitting: Physical Therapy

## 2013-10-14 ENCOUNTER — Ambulatory Visit: Payer: Medicare Other | Admitting: Physical Therapy

## 2013-10-17 ENCOUNTER — Ambulatory Visit: Payer: Medicare Other | Admitting: Physical Therapy

## 2013-10-21 ENCOUNTER — Ambulatory Visit: Payer: Medicare Other | Admitting: Physical Therapy

## 2013-10-24 ENCOUNTER — Ambulatory Visit: Payer: Medicare Other | Admitting: Physical Therapy

## 2013-10-28 ENCOUNTER — Ambulatory Visit: Payer: Medicare Other | Attending: Orthopedic Surgery | Admitting: Physical Therapy

## 2013-10-28 DIAGNOSIS — M25619 Stiffness of unspecified shoulder, not elsewhere classified: Secondary | ICD-10-CM | POA: Diagnosis not present

## 2013-10-28 DIAGNOSIS — IMO0001 Reserved for inherently not codable concepts without codable children: Secondary | ICD-10-CM | POA: Diagnosis not present

## 2013-10-28 DIAGNOSIS — M25519 Pain in unspecified shoulder: Secondary | ICD-10-CM | POA: Insufficient documentation

## 2013-10-28 DIAGNOSIS — R5381 Other malaise: Secondary | ICD-10-CM | POA: Insufficient documentation

## 2013-10-28 DIAGNOSIS — M7989 Other specified soft tissue disorders: Secondary | ICD-10-CM | POA: Insufficient documentation

## 2013-10-31 ENCOUNTER — Ambulatory Visit: Payer: Medicare Other | Admitting: Physical Therapy

## 2013-11-04 ENCOUNTER — Ambulatory Visit: Payer: Medicare Other | Admitting: Physical Therapy

## 2013-11-05 ENCOUNTER — Other Ambulatory Visit: Payer: Medicare Other

## 2013-11-05 DIAGNOSIS — G609 Hereditary and idiopathic neuropathy, unspecified: Secondary | ICD-10-CM

## 2013-11-07 ENCOUNTER — Encounter: Payer: Self-pay | Admitting: Neurology

## 2013-11-07 ENCOUNTER — Ambulatory Visit: Payer: Medicare Other | Admitting: Physical Therapy

## 2013-11-11 ENCOUNTER — Ambulatory Visit: Payer: Medicare Other | Admitting: Physical Therapy

## 2013-11-15 ENCOUNTER — Ambulatory Visit: Payer: Medicare Other | Admitting: Physical Therapy

## 2013-11-18 ENCOUNTER — Ambulatory Visit: Payer: Medicare Other | Admitting: Physical Therapy

## 2013-11-25 ENCOUNTER — Ambulatory Visit: Payer: Medicare Other | Attending: Orthopedic Surgery | Admitting: Physical Therapy

## 2013-11-25 DIAGNOSIS — M7989 Other specified soft tissue disorders: Secondary | ICD-10-CM | POA: Insufficient documentation

## 2013-11-25 DIAGNOSIS — M25519 Pain in unspecified shoulder: Secondary | ICD-10-CM | POA: Insufficient documentation

## 2013-11-25 DIAGNOSIS — R5381 Other malaise: Secondary | ICD-10-CM | POA: Insufficient documentation

## 2013-11-25 DIAGNOSIS — IMO0001 Reserved for inherently not codable concepts without codable children: Secondary | ICD-10-CM | POA: Diagnosis not present

## 2013-11-25 DIAGNOSIS — M25619 Stiffness of unspecified shoulder, not elsewhere classified: Secondary | ICD-10-CM | POA: Insufficient documentation

## 2013-11-28 ENCOUNTER — Ambulatory Visit: Payer: Medicare Other | Admitting: Physical Therapy

## 2013-12-02 ENCOUNTER — Encounter: Payer: Medicare Other | Admitting: Physical Therapy

## 2013-12-02 DIAGNOSIS — H3530 Unspecified macular degeneration: Secondary | ICD-10-CM | POA: Diagnosis not present

## 2013-12-02 DIAGNOSIS — Z961 Presence of intraocular lens: Secondary | ICD-10-CM | POA: Diagnosis not present

## 2013-12-02 DIAGNOSIS — H5310 Unspecified subjective visual disturbances: Secondary | ICD-10-CM | POA: Diagnosis not present

## 2013-12-02 DIAGNOSIS — H43819 Vitreous degeneration, unspecified eye: Secondary | ICD-10-CM | POA: Diagnosis not present

## 2013-12-03 ENCOUNTER — Ambulatory Visit: Payer: Medicare Other | Admitting: Physical Therapy

## 2013-12-05 ENCOUNTER — Ambulatory Visit: Payer: Medicare Other | Admitting: Physical Therapy

## 2013-12-09 ENCOUNTER — Ambulatory Visit: Payer: Medicare Other | Admitting: Physical Therapy

## 2013-12-12 ENCOUNTER — Ambulatory Visit: Payer: Medicare Other | Admitting: Physical Therapy

## 2013-12-24 ENCOUNTER — Ambulatory Visit: Payer: Medicare Other | Admitting: Physical Therapy

## 2014-01-24 ENCOUNTER — Other Ambulatory Visit: Payer: Self-pay | Admitting: Internal Medicine

## 2014-02-24 ENCOUNTER — Ambulatory Visit: Payer: Medicare Other | Admitting: Internal Medicine

## 2014-03-10 ENCOUNTER — Other Ambulatory Visit: Payer: Self-pay | Admitting: Internal Medicine

## 2014-03-31 ENCOUNTER — Telehealth: Payer: Self-pay | Admitting: Neurology

## 2014-03-31 NOTE — Telephone Encounter (Signed)
Noted.  Donika K. Patel, DO   

## 2014-03-31 NOTE — Telephone Encounter (Signed)
Pt cancelled appt for 03/31/14, says she is feeling better and doing OK / Sherri S.

## 2014-04-02 ENCOUNTER — Ambulatory Visit: Payer: Medicare Other | Admitting: Neurology

## 2014-08-12 ENCOUNTER — Other Ambulatory Visit: Payer: Self-pay | Admitting: Internal Medicine

## 2014-08-19 ENCOUNTER — Telehealth: Payer: Self-pay | Admitting: Internal Medicine

## 2014-08-19 NOTE — Telephone Encounter (Signed)
Opened in error/kh 

## 2014-08-25 ENCOUNTER — Encounter: Payer: Self-pay | Admitting: Internal Medicine

## 2014-08-25 ENCOUNTER — Encounter: Payer: Medicare Other | Admitting: Internal Medicine

## 2014-09-25 ENCOUNTER — Encounter: Payer: Self-pay | Admitting: Neurology

## 2014-09-25 ENCOUNTER — Ambulatory Visit (INDEPENDENT_AMBULATORY_CARE_PROVIDER_SITE_OTHER): Payer: Medicare Other | Admitting: Neurology

## 2014-09-25 VITALS — BP 130/84 | HR 53 | Ht 65.0 in | Wt 158.4 lb

## 2014-09-25 DIAGNOSIS — G5602 Carpal tunnel syndrome, left upper limb: Secondary | ICD-10-CM

## 2014-09-25 DIAGNOSIS — R292 Abnormal reflex: Secondary | ICD-10-CM | POA: Diagnosis not present

## 2014-09-25 NOTE — Progress Notes (Signed)
[facility]  Follow-up Visit   Date: 09/25/2014   Adriana Hendricks MRN: [id number] DOB: 05-18-1935   Interim History: Adriana Hendricks is a 79 y.o. year-old right-handed Caucasian female with history of GERD and hypertension returning to the clinic for follow-up of left carpal tunnel syndrome s/p release (08/2013).  The patient was accompanied to the clinic by her husband.  She was last seen in the clinic on 10/02/2013.  History of present illness: Since February 2013, she had experienced left shoulder pain and reduced range of motion and was later found to have a partial left rotator cuff tear. She was doing physical therapy and in March 2014 (last two sessions), she was stretching and strengthening internal rotation of the upper arm with green rubberbands (highest tensile strength). By the end of the second session, she developed acute onset of numbness involving the medial thumb and lateral index finger. Symptoms are more intense and more constant now. Numbness does not radiate into the palmer/dorsal surface of the hand or involve the middle finger. There is rare tingling of the left hand. Symptoms are worse when she wakes up. She has tried a wrist brace and head/ice which did not help. There is associated left hand weakness, such as when opening jars. She also has has chronic shoulder pain, which limits her strength so is unsure if that is contributing to her weakness  - Follow-up 10/02/2013:  She had an EMG on 9/10 which showed mild left carpal tunnel syndrome.  Since then, she underwent left rotator cuff repair and left carpal tunnel release on 9/16.  She has the left arm in a sling and has started physical therapy.  She has noticed 80% improvement of numbness/tingling of her thumb and index finger.  She remains weak in the hand and has pain because the incision is still healing.  There is also history of bilateral feet numbness which has been present for a number of years  and gait unsteadiness.  UPDATE 09/25/2014:  She is doing great with no new neurological complaints.  Denies any hand weakness or numbness/tingling.  Her feet numbness seems to have improved during the summer also and tells me that she no longer has to wear socks all the time.   Medications:  Current Outpatient Prescriptions on File Prior to Visit  Medication Sig Dispense Refill  . Cholecalciferol (VITAMIN D) 400 UNITS capsule Take 400 Units by mouth daily.        . fish oil-omega-3 fatty acids 1000 MG capsule Take by mouth daily.       . nadolol-bendroflumethiazide (CORZIDE) 40-5 MG per tablet TAKE 1 TABLET BY MOUTH DAILY  90 tablet  0  . omeprazole (PRILOSEC) 20 MG capsule TAKE 1 CAPSULE BY MOUTH DAILY  90 capsule  3  . Saccharomyces boulardii (FLORASTOR PO) Take by mouth. Takes as needed       No current facility-administered medications on file prior to visit.    Allergies:  Allergies  Allergen Reactions  . Clarithromycin     REACTION: nausea  . Erythromycin Base   . Levofloxacin     Patient has a history of a Achilles tendon tear and developed tendon pain while on medication  . Metronidazole     REACTION: Rash  . Penicillins      Review of Systems:  CONSTITUTIONAL: No fevers, chills, night sweats, or weight loss.   EYES: No visual changes or eye pain ENT: No hearing changes.  No history of nose bleeds.  RESPIRATORY: No cough, wheezing and shortness of breath.   CARDIOVASCULAR: Negative for chest pain, and palpitations.   GI: Negative for abdominal discomfort, blood in stools or black stools.  No recent change in bowel habits.   GU:  No history of incontinence.   MUSCLOSKELETAL: No history of joint pain or swelling.  No myalgias.   SKIN: Negative for lesions, rash, and itching.   HEMATOLOGY/ONCOLOGY: Negative for prolonged bleeding, bruising easily, and swollen nodes.  ENDOCRINE: Negative for cold or heat intolerance, polydipsia or goiter.   PSYCH:  No depression or  anxiety symptoms.   NEURO: As Above.   Vital Signs:  BP 130/84  Pulse 53  Ht 5\' 5"  (1.651 m)  Wt 158 lb 7 oz (71.867 kg)  BMI 26.37 kg/m2  SpO2 98%   Neurological Exam: MENTAL STATUS including orientation to time, place, person, recent and remote memory, attention span and concentration, language, and fund of knowledge is normal.  Speech is not dysarthric.  CRANIAL NERVES:    Visual fields are intact.  Extraocular muscle are intact.  No ptosis.  Face is symmetric.  Tongue is midline.  MOTOR: Motor strength is 5/5 throughout, including distal hand and feet muscles and ABP.  MSRs:   Right      Left  brachioradialis  3+   brachioradialis  3+  biceps  3+   biceps  3+  triceps  3+   triceps  3+  patellar  3+   patellar  3+   ankle jerk  trace   ankle jerk  trace   Hoffman  no   Hoffman  no   plantar response  down   plantar response  down   Crossed adductors present. No clonus.   SENSORY: Normal and symmetric pin prick, vibration, and light touch throughout (improved). Romberg's sign absent (improved).  COORDINATION/GAIT: Normal finger-to- nose-finger.  Gait narrow based and stable. Unsteady with tandem gait intact.   Data: Labs 09/02/2013:  Ceruloplasmin 43, copper 171, fT4 0.98, fT3 2.6, SPEP/UPEP with IFE, 2hr GTT 100*/121/123 Labs 07/05/2013:  Vitamin B12 1225, HbA1c 6.0, TSH 1.69  EMG left arm 09/04/2013:  Left median neuropathy at or distal to the wrist (consistent with carpal tunnel syndrome), mild in degree electrically, based the prolongation of the median sensory distal latencies.   IMPRESSION/PLAN:  1.  Left CTS s/p release (08/2013)  - Clinically, doing great and is asymptomatic.  - No motor deficits or paresthesias  2.  Idiopathic peripheral neuropathy  - Neuropathy labs are normal, except mildly abnormal glucose tolerance test  - Clinically, improved!  - Discussed to monitor symptoms as there are still subtle signs of neuropathy on exam  3.  Hyperreflexia likely  due to cervical spondylosis with possible canal stenosis  - Since she remains asymptomatic, will follow  - If she develops new neck pain or weakness, will image as needed  4.  Return to clinic as needed   The duration of this appointment visit was 15 minutes of face-to-face time with the patient.  Greater than 50% of this time was spent in counseling, explanation of diagnosis, planning of further management, and coordination of care.   Thank you for allowing me to participate in patient's care.  If I can answer any additional questions, I would be pleased to do so.    Sincerely,    Elmor Kost K. Posey Pronto, DO

## 2014-09-25 NOTE — Patient Instructions (Signed)
Come back and see me as needed, should anything worsen.

## 2014-09-26 ENCOUNTER — Ambulatory Visit: Payer: Medicare Other | Admitting: Internal Medicine

## 2014-09-26 ENCOUNTER — Ambulatory Visit (INDEPENDENT_AMBULATORY_CARE_PROVIDER_SITE_OTHER): Payer: Medicare Other | Admitting: Internal Medicine

## 2014-09-26 ENCOUNTER — Encounter: Payer: Self-pay | Admitting: Internal Medicine

## 2014-09-26 VITALS — BP 130/80 | HR 72 | Ht 65.0 in | Wt 159.0 lb

## 2014-09-26 DIAGNOSIS — Z860101 Personal history of adenomatous and serrated colon polyps: Secondary | ICD-10-CM

## 2014-09-26 DIAGNOSIS — K219 Gastro-esophageal reflux disease without esophagitis: Secondary | ICD-10-CM

## 2014-09-26 DIAGNOSIS — Z86010 Personal history of colonic polyps: Secondary | ICD-10-CM | POA: Diagnosis not present

## 2014-09-26 DIAGNOSIS — K222 Esophageal obstruction: Secondary | ICD-10-CM

## 2014-09-26 NOTE — Progress Notes (Signed)
Adriana Hendricks 1935/12/21 [id number]  Note: This dictation was prepared with Dragon digital system. Any transcriptional errors that result from this procedure are unintentional.   History of Present Illness: This is a 79 year old white female  with history of benign distal esophageal stricture requiring esophageal dilation in 2011 with Savary dilators  12-16 mm, she at that time had esophagitis and 3 cm hiatal hernia . The stricture measured 9 mm in diameter. She is now having problems with solid food but she denies heartburn, cough or hoarseness. She sleeps with the head of the bed elevated but does not take any acid reducing agents because of concern for osteoporosis. She is also due for screening  colonoscopy. Last exam in October 2010  Revealed  tubular adenoma and hyperplastic polyps. She now comes with progressive solid food dysphagia.    Past Medical History  Diagnosis Date  . Hypertension   . Diverticulosis   . Colon polyps     Tubular adenoma and hyperplastic  . Collagenous colitis 2005  . Stricture esophagus   . Hiatal hernia   . Esophagitis   . GERD (gastroesophageal reflux disease)   . Hx: UTI (urinary tract infection)   . Heart murmur     Past Surgical History  Procedure Laterality Date  . Appendectomy    . Tonsillectomy    . Cataract extraction    . Moles removed    . Excision morton's neuroma      Right foot  . Mouth surgery      (For Exostosis)  . Intraocular lens implant, secondary    . Carpal tunnel release    . Rotator cuff repair      Allergies  Allergen Reactions  . Clarithromycin     REACTION: nausea  . Erythromycin Base   . Levofloxacin     Patient has a history of a Achilles tendon tear and developed tendon pain while on medication  . Metronidazole     REACTION: Rash  . Penicillins     Family history and social history have been reviewed.  Review of Systems: Solid food dysphagia. Denies chest pain abdominal pain or rectal bleeding  The  remainder of the 10 point ROS is negative except as outlined in the H&P  Physical Exam: General Appearance Well developed, in no distress Psychological Normal mood and affect  Assessment and Plan:   [age] ALT white female with a history of distal esophageal stricture now with recurrent solid food dysphagia. She has not been taking  acid reducing agents and I have discussed this with her because she needs to take at least  H2 receptor antagonists to prevent recurrence of the stricture. She agrees to start  Zantac 75 mg daily in combination with the antireflux measures. We are going to schedule upper endoscopy and dilatation.  I told her she needs to take a stronger ranitidine perhaps 150 or 300 mg , we will make the decision based on upper endoscopy  History of adenomatous colon polyps. She is due for recall colonoscopy in October 2015. This procedure will be scheduled the same day as upper endoscopy    Adriana Hendricks 09/26/2014

## 2014-09-26 NOTE — Patient Instructions (Addendum)
You have been scheduled for an endoscopy and colonoscopy. Please follow the written instructions given to you at your visit today. Please pick up your prep at the pharmacy within the next 1-3 days. If you use inhalers (even only as needed), please bring them with you on the day of your procedure. Your physician has requested that you go to www.startemmi.com and enter the access code given to you at your visit today. This web site gives a general overview about your procedure. However, you should still follow specific instructions given to you by our office regarding your preparation for the procedure.  Please purchase over the counter zantac 75mg  and take one a day.  I appreciate the opportunity to care for you. Dr Maudie Mercury

## 2014-09-28 ENCOUNTER — Other Ambulatory Visit: Payer: Self-pay

## 2014-09-28 MED ORDER — MOVIPREP 100 G PO SOLR: ORAL | Status: DC

## 2014-10-01 ENCOUNTER — Ambulatory Visit (INDEPENDENT_AMBULATORY_CARE_PROVIDER_SITE_OTHER): Payer: Medicare Other

## 2014-10-01 DIAGNOSIS — Z23 Encounter for immunization: Secondary | ICD-10-CM | POA: Diagnosis not present

## 2014-10-08 ENCOUNTER — Encounter: Payer: Self-pay | Admitting: Internal Medicine

## 2014-10-08 ENCOUNTER — Ambulatory Visit (AMBULATORY_SURGERY_CENTER): Payer: Medicare Other | Admitting: Internal Medicine

## 2014-10-08 VITALS — BP 126/83 | HR 55 | Temp 97.2°F | Resp 21 | Ht 65.0 in | Wt 159.0 lb

## 2014-10-08 DIAGNOSIS — D120 Benign neoplasm of cecum: Secondary | ICD-10-CM

## 2014-10-08 DIAGNOSIS — K219 Gastro-esophageal reflux disease without esophagitis: Secondary | ICD-10-CM | POA: Diagnosis not present

## 2014-10-08 DIAGNOSIS — D125 Benign neoplasm of sigmoid colon: Secondary | ICD-10-CM

## 2014-10-08 DIAGNOSIS — Z86010 Personal history of colonic polyps: Secondary | ICD-10-CM

## 2014-10-08 DIAGNOSIS — K222 Esophageal obstruction: Secondary | ICD-10-CM

## 2014-10-08 DIAGNOSIS — K635 Polyp of colon: Secondary | ICD-10-CM

## 2014-10-08 MED ORDER — SODIUM CHLORIDE 0.9 % IV SOLN: 500.0000 mL | INTRAVENOUS | Status: DC

## 2014-10-08 NOTE — Patient Instructions (Signed)
YOU HAD AN ENDOSCOPIC PROCEDURE TODAY AT [facility]: Refer to the procedure report that was given to you for any specific questions about what was found during the examination.  If the procedure report does not answer your questions, please call your gastroenterologist to clarify.  If you requested that your care partner not be given the details of your procedure findings, then the procedure report has been included in a sealed envelope for you to review at your convenience later.  YOU SHOULD EXPECT: Some feelings of bloating in the abdomen. Passage of more gas than usual.  Walking can help get rid of the air that was put into your GI tract during the procedure and reduce the bloating. If you had a lower endoscopy (such as a colonoscopy or flexible sigmoidoscopy) you may notice spotting of blood in your stool or on the toilet paper. If you underwent a bowel prep for your procedure, then you may not have a normal bowel movement for a few days.  DIET:Follow dilatation diet today    ACTIVITY: Your care partner should take you home directly after the procedure.  You should plan to take it easy, moving slowly for the rest of the day.  You can resume normal activity the day after the procedure however you should NOT DRIVE or use heavy machinery for 24 hours (because of the sedation medicines used during the test).    SYMPTOMS TO REPORT IMMEDIATELY: A gastroenterologist can be reached at any hour.  During normal business hours, 8:30 AM to 5:00 PM Monday through Friday, call [phone].  After hours and on weekends, please call the GI answering service at [phone] who will take a message and have the physician on call contact you.   Following lower endoscopy (colonoscopy or flexible sigmoidoscopy):  Excessive amounts of blood in the stool  Significant tenderness or worsening of abdominal pains  Swelling of the abdomen that is new, acute  Fever of 100F or higher  Following  upper endoscopy (EGD)  Vomiting of blood or coffee ground material  New chest pain or pain under the shoulder blades  Painful or persistently difficult swallowing  New shortness of breath  Fever of 100F or higher  Black, tarry-looking stools  FOLLOW UP: If any biopsies were taken you will be contacted by phone or by letter within the next 1-3 weeks.  Call your gastroenterologist if you have not heard about the biopsies in 3 weeks.  Our staff will call the home number listed on your records the next business day following your procedure to check on you and address any questions or concerns that you may have at that time regarding the information given to you following your procedure. This is a courtesy call and so if there is no answer at the home number and we have not heard from you through the emergency physician on call, we will assume that you have returned to your regular daily activities without incident.  SIGNATURES/CONFIDENTIALITY: You and/or your care partner have signed paperwork which will be entered into your electronic medical record.  These signatures attest to the fact that that the information above on your After Visit Summary has been reviewed and is understood.  Full responsibility of the confidentiality of this discharge information lies with you and/or your care-partner.     Follow dilatation diet today  Follow anti reflux regimen (information given tou you today)  Continue Zantac  Information on polyps and diverticulosis and high fiber diet given  to you today

## 2014-10-08 NOTE — Progress Notes (Signed)
Report to PACU, RN, vss, BBS= Clear.  

## 2014-10-08 NOTE — Progress Notes (Signed)
Called to room to assist during endoscopic procedure.  Patient ID and intended procedure confirmed with present staff. Received instructions for my participation in the procedure from the performing physician.  

## 2014-10-08 NOTE — Op Note (Signed)
[place]  [address] [place], [address]   COLONOSCOPY PROCEDURE REPORT  PATIENT: Adriana Hendricks, Adriana Hendricks  MR#: [id number] BIRTHDATE: 1935-12-14 , 79  yrs. old GENDER: female ENDOSCOPIST: Lafayette Dragon, MD REFERRED BO:FBPZ Vear Clock, M.D. PROCEDURE DATE:  10/08/2014 PROCEDURE:   Colonoscopy with cold biopsy polypectomy and Colonoscopy with snare polypectomy First Screening Colonoscopy - Avg.  risk and is 50 yrs.  old or older - No.  Prior Negative Screening - Now for repeat screening. N/A  History of Adenoma - Now for follow-up colonoscopy & has been > or = to 3 yrs.  Yes hx of adenoma.  Has been 3 or more years since last colonoscopy.  Polyps Removed Today? Yes. ASA CLASS:   Class II INDICATIONS:adenomatous polyp removed on colonoscopy in 2010. MEDICATIONS: Monitored anesthesia care and Propofol 190 mg IV  DESCRIPTION OF PROCEDURE:   After the risks benefits and alternatives of the procedure were thoroughly explained, informed consent was obtained.  The digital rectal exam revealed no abnormalities of the rectum.   The LB [id number]  endoscope was introduced through the anus and advanced to the cecum, which was identified by both the appendix and ileocecal valve. No adverse events experienced.   The quality of the prep was good, using MoviPrep  The instrument was then slowly withdrawn as the colon was fully examined.      COLON FINDINGS: Two sessile polyps were found in the sigmoid colon and at the cecum.  A polypectomy was performed with cold forceps. There was severe diverticulosis noted in the sigmoid colon with associated colonic spasm, luminal narrowing, muscular hypertrophy, angulation, tortuosity and colonic narrowing.  Retroflexed views revealed no abnormalities. The time to cecum=18 minutes 53 seconds. Withdrawal time=8 minutes 39 seconds.  The scope was withdrawn and the procedure completed. COMPLICATIONS: There were no immediate  complications.  ENDOSCOPIC IMPRESSION: 1.   Two sessile polyps were found in the sigmoid colon x1 and at the cecum x 1; polypectomy was performed with cold forceps in sigmoid colon and cold snare in the cecum 2.   There was severe diverticulosis noted in the sigmoid colon causing partial obstruction, and difficulty in advancing the pediatric scope through the sigmoid colon  RECOMMENDATIONS: 1.  Await pathology results 2.  High fiber diet Metamucil or Benefiber 1 tablespoon daily No recall colonoscopy due to age  eSigned:  Lafayette Dragon, MD 10/08/2014 3:06 PM   cc:   PATIENT NAME:  Adriana Hendricks, Adriana Hendricks MR#: [id number]

## 2014-10-08 NOTE — Progress Notes (Signed)
Left upper arm sore and reddened from flu shot

## 2014-10-08 NOTE — Op Note (Signed)
[place]  [address]. [place], [address]   ENDOSCOPY PROCEDURE REPORT  PATIENT: Adriana Hendricks, Adriana Hendricks  MR#: [id number] BIRTHDATE: 03/21/1935 , 79  yrs. old GENDER: female ENDOSCOPIST: Lafayette Dragon, MD REFERRED BY:  Ricard Dillon, M.D. PROCEDURE DATE:  10/08/2014 PROCEDURE:  EGD, diagnostic and Savary dilation of esophagus ASA CLASS:     Class II INDICATIONS:  dysphagia and history of esophageal stricture. Esophageal dilation in 2011 from 12-16 mm. MEDICATIONS: Monitored anesthesia care and Propofol 250 mg IV TOPICAL ANESTHETIC: none  DESCRIPTION OF PROCEDURE: After the risks benefits and alternatives of the procedure were thoroughly explained, informed consent was obtained.  The LB [id number] endoscope was introduced through the mouth and advanced to the second portion of the duodenum , Without limitations.  The instrument was slowly withdrawn as the mucosa was fully examined.    Esophagus: proximal mid and distal esophageal mucosa was normal. There was a benign appearing mild to moderate esophageal stricture at the GE junction at the level of 35 cm from the incisors. The cystic passage of the scope but it admitted the scope after mild pressure. There was bleeding from the stricture as it was dilated with the endoscope. There were no erosions. Distal to the stricture was a small hiatal hernia Stomach: gastric forceps, gastric antrum and pyloric outlet were unremarkable. Retroflexion of the endoscope showed normal fundus and cardia Duodenum: duodenal bulb and descending duodenum was normal Guidewire was then placed through the endoscope with the tip in the gastric antrum and Savary dilators starting with the 13 mm passed over the guidewire followed by 14 mm, and 15 mm. There was small amount of blood on the last ilator and there was some resistance to the passage of the last dilator[         The scope was then withdrawn from the patient and the  procedure completed.  COMPLICATIONS: There were no immediate complications.  ENDOSCOPIC IMPRESSION:  Benign distal esophageal stricture 9 mm in diameter, dilated with Savary dilators to 15 mm Small hiatal hernia   RECOMMENDATIONS: 1.  Anti-reflux regimen to be follow 2.  Continue with Zantac 75 mg daily May need to repeat an endoscopy with dilatation as needed for recurrent dysphagia  REPEAT EXAM:as needed  eSigned:  Lafayette Dragon, MD 10/08/2014 3:00 PM    CC:  PATIENT NAME:  [name], [name] MR#: [id number]

## 2014-10-09 ENCOUNTER — Telehealth: Payer: Self-pay | Admitting: *Deleted

## 2014-10-09 NOTE — Telephone Encounter (Signed)
  Follow up Call-  Call back number 10/08/2014 08/31/2012  Post procedure Call Back phone  # [phone]  Permission to leave phone message Yes Yes     Patient questions:  Do you have a fever, pain , or abdominal swelling? No. Pain Score  0 *  Have you tolerated food without any problems? Yes.    Have you been able to return to your normal activities? Yes.    Do you have any questions about your discharge instructions: Diet   No. Medications  No. Follow up visit  No.  Do you have questions or concerns about your Care? No.  Actions: * If pain score is 4 or above: No action needed, pain <4.

## 2014-10-14 ENCOUNTER — Encounter: Payer: Self-pay | Admitting: Internal Medicine

## 2014-10-25 ENCOUNTER — Ambulatory Visit (INDEPENDENT_AMBULATORY_CARE_PROVIDER_SITE_OTHER): Payer: Medicare Other | Admitting: Family Medicine

## 2014-10-25 ENCOUNTER — Encounter: Payer: Self-pay | Admitting: Family Medicine

## 2014-10-25 VITALS — BP 122/78 | HR 58 | Temp 98.2°F | Ht 65.0 in | Wt 159.8 lb

## 2014-10-25 DIAGNOSIS — K047 Periapical abscess without sinus: Secondary | ICD-10-CM

## 2014-10-25 DIAGNOSIS — I10 Essential (primary) hypertension: Secondary | ICD-10-CM | POA: Diagnosis not present

## 2014-10-25 HISTORY — DX: Periapical abscess without sinus: K04.7

## 2014-10-25 MED ORDER — CEPHALEXIN 500 MG PO CAPS: 500.0000 mg | ORAL_CAPSULE | Freq: Four times a day (QID) | ORAL | Status: DC

## 2014-10-25 NOTE — Progress Notes (Signed)
Pre visit review using our clinic review tool, if applicable. No additional management support is needed unless otherwise documented below in the visit note. 

## 2014-10-25 NOTE — Assessment & Plan Note (Signed)
Started on Keflex and probiotics, she agrees to call her dentist first thing during the week for further conisderation

## 2014-10-25 NOTE — Patient Instructions (Signed)

## 2014-10-25 NOTE — Assessment & Plan Note (Signed)
Well controlled, no changes to meds. Encouraged heart healthy diet such as the DASH diet and exercise as tolerated.  °

## 2014-10-25 NOTE — Progress Notes (Signed)
Patient ID: Adriana Hendricks, female   DOB: March 02, 1935, 79 y.o.   MRN: [id number] Adriana Hendricks [id number] 12/05/1935 10/25/2014      Progress Note-Follow Up  Subjective  Chief Complaint  Chief Complaint  Patient presents with  . Edema    X today- woke up with abscess on right side  . Jaw Pain    X today    HPI  Patient is a 79 year old female in today for routine medical care. She began to note swelling and pain in right lower jaw line just this am. NO fevers, chills, malaise or myalgias. Denies CP/palp/SOB/HA/congestion/fevers/GI or GU c/o. Taking meds as prescribed  Past Medical History  Diagnosis Date  . Hypertension   . Diverticulosis   . Colon polyps     Tubular adenoma and hyperplastic  . Collagenous colitis 2005  . Stricture esophagus   . Hiatal hernia   . Esophagitis   . GERD (gastroesophageal reflux disease)   . Hx: UTI (urinary tract infection)   . Heart murmur   . Dental infection 10/25/2014    Past Surgical History  Procedure Laterality Date  . Appendectomy    . Tonsillectomy    . Cataract extraction    . Moles removed    . Excision morton's neuroma      Right foot  . Mouth surgery      (For Exostosis)  . Intraocular lens implant, secondary    . Carpal tunnel release    . Rotator cuff repair      Family History  Problem Relation Age of Onset  . Rectal cancer Father   . Heart failure Father     Died, 89  . Dementia Mother     Died, 69  . Barrett's esophagus Son   . Colon cancer Neg Hx   . Esophageal cancer Neg Hx   . Stomach cancer Neg Hx     History   Social History  . Marital Status: Married    Spouse Name: N/A    Number of Children: 2  . Years of Education: N/A   Occupational History  . Retired    Social History Main Topics  . Smoking status: Former Research scientist (life sciences)  . Smokeless tobacco: Never Used     Comment: Quit in 1969  . Alcohol Use: 1.2 oz/week    2 Glasses of wine per week     Comment: 2 glasses of wine 4-6 nights per week   . Drug Use: No  . Sexual Activity: Yes   Other Topics Concern  . Not on file   Social History Narrative   Retired from [place] [employer].  Lives with husband of 63 years in a one-story home.                Current Outpatient Prescriptions on File Prior to Visit  Medication Sig Dispense Refill  . b complex vitamins tablet Take 1 tablet by mouth daily.      . beta carotene w/minerals (OCUVITE) tablet Take 1 tablet by mouth daily.      . Cholecalciferol (VITAMIN D3) 400 UNITS CAPS Take 1 capsule by mouth daily.      . fish oil-omega-3 fatty acids 1000 MG capsule Take by mouth daily.       . nadolol-bendroflumethiazide (CORZIDE) 40-5 MG per tablet TAKE 1 TABLET BY MOUTH DAILY  90 tablet  0  . Saccharomyces boulardii (FLORASTOR PO) Take by mouth. Takes as needed      .  zinc sulfate 220 MG capsule Take 220 mg by mouth daily.       No current facility-administered medications on file prior to visit.    Allergies  Allergen Reactions  . Clarithromycin     REACTION: nausea  . Erythromycin Base Nausea And Vomiting  . Levofloxacin     Patient has a history of a Achilles tendon tear and developed tendon pain while on medication  . Metronidazole     REACTION: Rash  . Penicillins     Rash     Review of Systems  Review of Systems  Constitutional: Negative for fever and malaise/fatigue.  HENT: Negative for congestion.        Swelling and right lower jaw pain  Eyes: Negative for discharge.  Respiratory: Negative for shortness of breath.   Cardiovascular: Negative for chest pain, palpitations and leg swelling.  Gastrointestinal: Negative for nausea, abdominal pain and diarrhea.  Genitourinary: Negative for dysuria.  Musculoskeletal: Negative for falls.  Skin: Negative for rash.  Neurological: Negative for loss of consciousness and headaches.  Endo/Heme/Allergies: Negative for polydipsia.  Psychiatric/Behavioral: Negative for depression and suicidal  ideas. The patient is not nervous/anxious and does not have insomnia.     Objective  BP 122/78  Pulse 58  Temp(Src) 98.2 F (36.8 C) (Oral)  Ht 5\' 5"  (1.651 m)  Wt 159 lb 12.8 oz (72.485 kg)  BMI 26.59 kg/m2  SpO2 92%  Physical Exam  Physical Exam  Constitutional: She is oriented to person, place, and time and well-developed, well-nourished, and in no distress. No distress.  HENT:  Head: Normocephalic and atraumatic.  Right lower jaw swollen and tender to palp no erythema or fluctuance. Significant hypertrophy of gingiva noted  Eyes: Conjunctivae are normal.  Neck: Neck supple. No thyromegaly present.  Cardiovascular: Normal rate, regular rhythm and normal heart sounds.   No murmur heard. Pulmonary/Chest: Effort normal and breath sounds normal. She has no wheezes.  Abdominal: She exhibits no distension and no mass.  Musculoskeletal: She exhibits no edema.  Lymphadenopathy:    She has no cervical adenopathy.  Neurological: She is alert and oriented to person, place, and time.  Skin: Skin is warm and dry. No rash noted. She is not diaphoretic.  Psychiatric: Memory, affect and judgment normal.    Lab Results  Component Value Date   TSH 1.69 08/23/2013   Lab Results  Component Value Date   WBC 6.1 08/23/2013   HGB 14.4 08/23/2013   HCT 42.5 08/23/2013   MCV 91.6 08/23/2013   PLT 221.0 08/23/2013   Lab Results  Component Value Date   CREATININE 0.6 08/23/2013   BUN 10 08/23/2013   NA 131* 08/23/2013   K 4.8 08/23/2013   CL 93* 08/23/2013   CO2 29 08/23/2013   Lab Results  Component Value Date   ALT 13 08/23/2013   AST 15 08/23/2013   ALKPHOS 64 08/23/2013   BILITOT 0.9 08/23/2013   Lab Results  Component Value Date   CHOL 193 08/23/2013   Lab Results  Component Value Date   HDL 62.30 08/23/2013   Lab Results  Component Value Date   LDLCALC 119* 08/23/2013   Lab Results  Component Value Date   TRIG 59.0 08/23/2013   Lab Results  Component Value Date   CHOLHDL 3  08/23/2013     Assessment & Plan  Essential hypertension Well controlled, no changes to meds. Encouraged heart healthy diet such as the DASH diet and exercise as tolerated.  Dental infection Started on Keflex and probiotics, she agrees to call her dentist first thing during the week for further conisderation

## 2014-10-29 DIAGNOSIS — M278 Other specified diseases of jaws: Secondary | ICD-10-CM | POA: Diagnosis not present

## 2014-11-03 ENCOUNTER — Other Ambulatory Visit: Payer: Self-pay | Admitting: Dermatology

## 2014-11-03 DIAGNOSIS — L570 Actinic keratosis: Secondary | ICD-10-CM | POA: Diagnosis not present

## 2014-11-03 DIAGNOSIS — D1801 Hemangioma of skin and subcutaneous tissue: Secondary | ICD-10-CM | POA: Diagnosis not present

## 2014-11-03 DIAGNOSIS — L821 Other seborrheic keratosis: Secondary | ICD-10-CM | POA: Diagnosis not present

## 2014-11-03 DIAGNOSIS — D485 Neoplasm of uncertain behavior of skin: Secondary | ICD-10-CM | POA: Diagnosis not present

## 2014-11-03 DIAGNOSIS — D239 Other benign neoplasm of skin, unspecified: Secondary | ICD-10-CM | POA: Diagnosis not present

## 2014-11-26 ENCOUNTER — Other Ambulatory Visit: Payer: Self-pay | Admitting: Internal Medicine

## 2014-11-26 MED ORDER — NADOLOL-BENDROFLUMETHIAZIDE 40-5 MG PO TABS: 1.0000 | ORAL_TABLET | Freq: Every day | ORAL | Status: DC

## 2014-12-08 DIAGNOSIS — Z961 Presence of intraocular lens: Secondary | ICD-10-CM | POA: Diagnosis not present

## 2014-12-08 DIAGNOSIS — H524 Presbyopia: Secondary | ICD-10-CM | POA: Diagnosis not present

## 2014-12-08 DIAGNOSIS — H3531 Nonexudative age-related macular degeneration: Secondary | ICD-10-CM | POA: Diagnosis not present

## 2014-12-08 DIAGNOSIS — H43813 Vitreous degeneration, bilateral: Secondary | ICD-10-CM | POA: Diagnosis not present

## 2015-03-09 ENCOUNTER — Other Ambulatory Visit: Payer: Self-pay

## 2015-03-09 MED ORDER — NADOLOL-BENDROFLUMETHIAZIDE 40-5 MG PO TABS: 1.0000 | ORAL_TABLET | Freq: Every day | ORAL | Status: DC

## 2015-03-09 NOTE — Telephone Encounter (Signed)
Pt walked into the office for nadolol-bendroflumethiazide (CORZIDE) 40-5 MG per tablet.  Pt's pharmacy is [facility] for a 90 day supply.   Pt will establish care with Dr. Maudie Mercury on 4.18.2016.  Pls advise.

## 2015-03-12 ENCOUNTER — Telehealth: Payer: Self-pay | Admitting: Internal Medicine

## 2015-03-12 MED ORDER — NADOLOL-BENDROFLUMETHIAZIDE 40-5 MG PO TABS: 1.0000 | ORAL_TABLET | Freq: Every day | ORAL | Status: DC

## 2015-03-12 NOTE — Telephone Encounter (Signed)
Rx done. 

## 2015-03-12 NOTE — Telephone Encounter (Signed)
Patient needs a refill on Nadolol-Bendroflu. Patient states that the refill was sent to [facility] but wants the prescription to be sent to [facility]. The prescription should be sent by to [facility] at [phone].

## 2015-04-13 ENCOUNTER — Ambulatory Visit (INDEPENDENT_AMBULATORY_CARE_PROVIDER_SITE_OTHER): Payer: Medicare Other | Admitting: Family Medicine

## 2015-04-13 ENCOUNTER — Encounter: Payer: Self-pay | Admitting: Family Medicine

## 2015-04-13 VITALS — BP 130/70 | HR 62 | Temp 98.3°F | Ht 66.0 in | Wt 164.0 lb

## 2015-04-13 DIAGNOSIS — E2839 Other primary ovarian failure: Secondary | ICD-10-CM

## 2015-04-13 DIAGNOSIS — G5791 Unspecified mononeuropathy of right lower limb: Secondary | ICD-10-CM | POA: Diagnosis not present

## 2015-04-13 DIAGNOSIS — G5792 Unspecified mononeuropathy of left lower limb: Secondary | ICD-10-CM

## 2015-04-13 DIAGNOSIS — H6982 Other specified disorders of Eustachian tube, left ear: Secondary | ICD-10-CM

## 2015-04-13 DIAGNOSIS — Z7189 Other specified counseling: Secondary | ICD-10-CM

## 2015-04-13 DIAGNOSIS — Z23 Encounter for immunization: Secondary | ICD-10-CM

## 2015-04-13 DIAGNOSIS — K2100 Gastro-esophageal reflux disease with esophagitis, without bleeding: Secondary | ICD-10-CM

## 2015-04-13 DIAGNOSIS — I10 Essential (primary) hypertension: Secondary | ICD-10-CM

## 2015-04-13 DIAGNOSIS — G5793 Unspecified mononeuropathy of bilateral lower limbs: Secondary | ICD-10-CM

## 2015-04-13 DIAGNOSIS — Z7689 Persons encountering health services in other specified circumstances: Secondary | ICD-10-CM

## 2015-04-13 MED ORDER — NADOLOL-BENDROFLUMETHIAZIDE 40-5 MG PO TABS: 1.0000 | ORAL_TABLET | Freq: Every day | ORAL | Status: DC

## 2015-04-13 MED ORDER — OMEPRAZOLE 20 MG PO CPDR: 20.0000 mg | DELAYED_RELEASE_CAPSULE | Freq: Every day | ORAL | Status: DC

## 2015-04-13 MED ORDER — FLUTICASONE PROPIONATE 50 MCG/ACT NA SUSP: 2.0000 | Freq: Every day | NASAL | Status: DC

## 2015-04-13 NOTE — Progress Notes (Signed)
HPI:  Adriana Hendricks is here to establish care.  Last PCP and physical: she sees a gynecologist  Has the following chronic problems that require follow up and concerns today:  L ear popping/Seasonal allergies: -started a few months ago -occurs when she chews sometimes -denies: hearing loss, pain in this ear -some nasal congestion, PND, sneezing  GERD/hx colon polyps: -takes zantac an omeprazole 20mg  intermittently for this for a long time - she wants a refill on this -sees Dr. Olevia Perches -hx stricture, hiatal hernia, esophagitis  HTN: -meds: nadolol-bendroflumethiazide -denies: CP, SOB, DOE  Hx Osteopenia: -wants to recheck bone density test  ROS negative for unless reported above: fevers, unintentional weight loss, hearing or vision loss, chest pain, palpitations, struggling to breath, hemoptysis, melena, hematochezia, hematuria, falls, loc, si, thoughts of self harm  Past Medical History  Diagnosis Date  . Hypertension   . Diverticulosis   . Colon polyps     Tubular adenoma and hyperplastic  . Collagenous colitis 2005  . GERD (gastroesophageal reflux disease)     hx hiatal hernia, hx esophagitis, hx stricture  . Hx: UTI (urinary tract infection)   . Heart murmur   . Dental infection 10/25/2014  . Lower extremity neuropathy 08/23/2013    On B12 therapy with numbness in the feet bilaterally no evidence of diabetes   . Carpal tunnel syndrome 09/04/2013  . Essential tremor   . Achilles tendon rupture   . Ocular migraine     jagged vision, a few per month    Past Surgical History  Procedure Laterality Date  . Appendectomy    . Tonsillectomy    . Cataract extraction    . Moles removed    . Excision morton's neuroma      Right foot  . Mouth surgery      (For Exostosis)  . Intraocular lens implant, secondary    . Carpal tunnel release    . Rotator cuff repair      Family History  Problem Relation Age of Onset  . Rectal cancer Father   . Heart failure Father      Died, 89  . Dementia Mother     Died, 61  . Barrett's esophagus Son   . Colon cancer Neg Hx   . Esophageal cancer Neg Hx   . Stomach cancer Neg Hx     History   Social History  . Marital Status: Married    Spouse Name: N/A  . Number of Children: 2  . Years of Education: N/A   Occupational History  . Retired    Social History Main Topics  . Smoking status: Former Research scientist (life sciences)  . Smokeless tobacco: Never Used     Comment: Quit in 1969  . Alcohol Use: 1.2 oz/week    2 Glasses of wine per week     Comment: 2 glasses of wine 4-6 nights per week  . Drug Use: No  . Sexual Activity: Yes   Other Topics Concern  . None   Social History Narrative   Retired from [place] [employer].  Lives with husband of 34 years in a one-story home.                 Current outpatient prescriptions:  .  ascorbic acid (VITAMIN C) 500 MG tablet, Take 500 mg by mouth daily., Disp: , Rfl:  .  b complex vitamins tablet, Take 1 tablet by mouth daily., Disp: , Rfl:  .  beta  carotene w/minerals (OCUVITE) tablet, Take 1 tablet by mouth daily., Disp: , Rfl:  .  cephALEXin (KEFLEX) 500 MG capsule, Take 1 capsule (500 mg total) by mouth 4 (four) times daily., Disp: 40 capsule, Rfl: 0 .  Cholecalciferol (VITAMIN D3) 400 UNITS CAPS, Take 1 capsule by mouth daily., Disp: , Rfl:  .  nadolol-bendroflumethiazide (CORZIDE) 40-5 MG per tablet, Take 1 tablet by mouth daily., Disp: 90 tablet, Rfl: 3 .  NON FORMULARY, Tumeric, Disp: , Rfl:  .  ranitidine (ZANTAC) 150 MG capsule, Take 150 mg by mouth at bedtime., Disp: , Rfl:  .  Saccharomyces boulardii (FLORASTOR PO), Take by mouth. Takes as needed, Disp: , Rfl:  .  zinc sulfate 220 MG capsule, Take 220 mg by mouth daily., Disp: , Rfl:  .  omeprazole (PRILOSEC) 20 MG capsule, Take 1 capsule (20 mg total) by mouth daily., Disp: 30 capsule, Rfl: 3  EXAM:  Filed Vitals:   04/13/15 1110  BP: 130/70  Pulse: 62  Temp: 98.3 F (36.8 C)     Body mass index is 26.48 kg/(m^2).  GENERAL: vitals reviewed and listed above, alert, oriented, appears well hydrated and in no acute distress  HEENT: atraumatic, conjunttiva clear, no obvious abnormalities on inspection of external nose and ears  NECK: no obvious masses on inspection  LUNGS: clear to auscultation bilaterally, no wheezes, rales or rhonchi, good air movement  CV: HRRR, no peripheral edema  MS: moves all extremities without noticeable abnormality  PSYCH: pleasant and cooperative, no obvious depression or anxiety  ASSESSMENT AND PLAN:  Discussed the following assessment and plan:  Gastroesophageal reflux disease with esophagitis  Essential hypertension - Plan: nadolol-bendroflumethiazide (CORZIDE) 40-5 MG per tablet  Encounter to establish care  Neuropathy involving both lower extremities  Eustachian tube dysfunction, left  Estrogen deficiency - Plan: DG Bone Density  -We reviewed the PMH, PSH, FH, SH, Meds and Allergies. -We provided refills for any medications we will prescribe as needed. -We addressed current concerns per orders and patient instructions. -We have asked for records for pertinent exams, studies, vaccines and notes from previous providers. -We have advised patient to follow up per instructions below.   -Patient advised to return or notify a doctor immediately if symptoms worsen or persist or new concerns arise.  Patient Instructions  BEFORE YOU LEAVE: -schedule your medicare wellness visit in 3 months -Prevnar 13  For the Ear: AFRIN nasal spray for 3 day twice daily Flonase 2 sprays each nostril daily for 21 days  We sent the referral for the bone density test          Colin Benton R.

## 2015-04-13 NOTE — Addendum Note (Signed)
Addended by: Agnes Lawrence on: 04/13/2015 11:53 AM   Modules accepted: Orders

## 2015-04-13 NOTE — Progress Notes (Signed)
Pre visit review using our clinic review tool, if applicable. No additional management support is needed unless otherwise documented below in the visit note. 

## 2015-04-13 NOTE — Patient Instructions (Addendum)
BEFORE YOU LEAVE: -schedule your medicare wellness visit in 3 months -Prevnar 13  For the Ear: AFRIN nasal spray for 3 day twice daily Flonase 2 sprays each nostril daily for 21 days  We sent the referral for the bone density test

## 2015-06-12 ENCOUNTER — Other Ambulatory Visit (INDEPENDENT_AMBULATORY_CARE_PROVIDER_SITE_OTHER): Payer: Medicare Other

## 2015-06-12 ENCOUNTER — Encounter: Payer: Self-pay | Admitting: Neurology

## 2015-06-12 ENCOUNTER — Ambulatory Visit (INDEPENDENT_AMBULATORY_CARE_PROVIDER_SITE_OTHER): Payer: Medicare Other | Admitting: Neurology

## 2015-06-12 VITALS — BP 160/80 | HR 61 | Ht 66.0 in | Wt 162.2 lb

## 2015-06-12 DIAGNOSIS — R292 Abnormal reflex: Secondary | ICD-10-CM | POA: Diagnosis not present

## 2015-06-12 DIAGNOSIS — G43809 Other migraine, not intractable, without status migrainosus: Secondary | ICD-10-CM | POA: Diagnosis not present

## 2015-06-12 DIAGNOSIS — G5602 Carpal tunnel syndrome, left upper limb: Secondary | ICD-10-CM

## 2015-06-12 DIAGNOSIS — G43109 Migraine with aura, not intractable, without status migrainosus: Secondary | ICD-10-CM

## 2015-06-12 LAB — TSH: TSH: 2.24 u[IU]/mL (ref 0.35–4.50)

## 2015-06-12 LAB — SEDIMENTATION RATE: Sed Rate: 11 mm/hr (ref 0–22)

## 2015-06-12 MED ORDER — TOPIRAMATE 25 MG PO CPSP
ORAL_CAPSULE | ORAL | Status: DC
Start: 2015-06-12 — End: 2015-07-29

## 2015-06-12 NOTE — Patient Instructions (Addendum)
1.  Start topamax 25mg  daily for one week, then increase to 2 tablets. 2.  Start aspirin 81mg  daily 3.  Check blood work 4.  Return to clinic in 4-6 week

## 2015-06-12 NOTE — Progress Notes (Signed)
[name]  Follow-up Visit   Date: 06/12/2015   Adriana Hendricks MRN: [id number] DOB: [birth year]   Interim History: Adriana Hendricks is a 80 y.o. year-old right-handed Caucasian female with history of GERD, hypertension, and left CTS s/p release (2014) returning to the clinic for new complaints of ocular migraines.  The patient was accompanied to the clinic by her husband.    History of present illness: Since February 2013, she had experienced left shoulder pain and reduced range of motion and was later found to have a partial left rotator cuff tear. She was doing physical therapy and in March 2014 (last two sessions), she was stretching and strengthening internal rotation of the upper arm with green rubberbands (highest tensile strength). By the end of the second session, she developed acute onset of numbness involving the medial thumb and lateral index finger. Symptoms are more intense and more constant now. Numbness does not radiate into the palmer/dorsal surface of the hand or involve the middle finger. There is rare tingling of the left hand. Symptoms are worse when she wakes up. She has tried a wrist brace and head/ice which did not help. There is associated left hand weakness, such as when opening jars. She also has has chronic shoulder pain, which limits her strength so is unsure if that is contributing to her weakness  - Follow-up 10/02/2013:  She had an EMG on 9/10 which showed mild left carpal tunnel syndrome.  Since then, she underwent left rotator cuff repair and left carpal tunnel release on 9/16.  She has the left arm in a sling and has started physical therapy.  She has noticed 80% improvement of numbness/tingling of her thumb and index finger.  She remains weak in the hand and has pain because the incision is still healing.  There is also history of bilateral feet numbness which has been present for a number of years and gait unsteadiness.  UPDATE 09/25/2014:   She is doing great with no new neurological complaints.  Denies any hand weakness or numbness/tingling.  Her feet numbness seems to have improved during the summer also and tells me that she no longer has to wear socks all the time.  UPDATE 06/12/2015:  She reports having ocular migraines for several years which would occur once per month, described as jagged appearance with loss of central vision involving both eyes, lasting 45 minutes.  During this time, she cannot drive or read because it interferes with her vision.  Over the past month, the frequency has increased to every 2-3 days, lasting 45 minutes. She does not have associated headaches.  She has not tried any tylenol, ibuprofen, or aspirin because it does not cause any pain.  She is not taking a daily aspirin.  No family history of migraines.  She is very concerned because her paternal grandfather.    Her left hand is doing well and she continues to wear her wrist splint.   Medications:  Current Outpatient Prescriptions on File Prior to Visit  Medication Sig Dispense Refill  . ascorbic acid (VITAMIN C) 500 MG tablet Take 500 mg by mouth daily.    Marland Kitchen b complex vitamins tablet Take 1 tablet by mouth daily.    . beta carotene w/minerals (OCUVITE) tablet Take 1 tablet by mouth daily.    . Cholecalciferol (VITAMIN D3) 400 UNITS CAPS Take 1 capsule by mouth daily.    . nadolol-bendroflumethiazide (CORZIDE) 40-5 MG per tablet Take 1 tablet by mouth daily. [place]  tablet 3  . NON FORMULARY Tumeric    . omeprazole (PRILOSEC) 20 MG capsule Take 1 capsule (20 mg total) by mouth daily. 30 capsule 3  . zinc sulfate 220 MG capsule Take 220 mg by mouth daily.     No current facility-administered medications on file prior to visit.    Allergies:  Allergies  Allergen Reactions  . Clarithromycin     REACTION: nausea  . Erythromycin Base Nausea And Vomiting  . Levofloxacin     Patient has a history of a Achilles tendon tear and developed tendon pain  while on medication  . Metronidazole     REACTION: Rash  . Penicillins     Rash      Review of Systems:  CONSTITUTIONAL: No fevers, chills, night sweats, or weight loss.   EYES: No visual changes or eye pain ENT: No hearing changes.  No history of nose bleeds.   RESPIRATORY: No cough, wheezing and shortness of breath.   CARDIOVASCULAR: Negative for chest pain, and palpitations.   GI: Negative for abdominal discomfort, blood in stools or black stools.  No recent change in bowel habits.   GU:  No history of incontinence.   MUSCLOSKELETAL: No history of joint pain or swelling.  No myalgias.   SKIN: Negative for lesions, rash, and itching.   HEMATOLOGY/ONCOLOGY: Negative for prolonged bleeding, bruising easily, and swollen nodes.  ENDOCRINE: Negative for cold or heat intolerance, polydipsia or goiter.   PSYCH:  No depression or anxiety symptoms.   NEURO: As Above.   Vital Signs:  BP 160/80 mmHg  Pulse 61  Ht _0  (1.676 m)  Wt 162 lb 3 oz (73.568 kg)  BMI 26.19 kg/m2  SpO2 97%  Gen:  Comfortable CV:  Regular rate and rhytm Pulm:  Clear to auscultation Ext:  No edema   Neurological Exam: MENTAL STATUS including orientation to time, place, person, recent and remote memory, attention span and concentration, language, and fund of knowledge is normal.  Speech is not dysarthric.  CRANIAL NERVES:    Normal fundoscopic exam.  Visual fields are intact.  Extraocular muscle are intact.  No ptosis.  Face is symmetric.  Tongue is midline.  MOTOR: Motor strength is 5/5 throughout, including distal hand and feet muscles and ABP.  MSRs:   Right      Left  brachioradialis  3+   brachioradialis  3+  biceps  3+   biceps  3+  triceps  3+   triceps  3+  patellar  3+   patellar  3+   ankle jerk  trace   ankle jerk  trace   Hoffman  no   Hoffman  no   plantar response  down   plantar response  down    SENSORY:  Vibration reduced at the great toes.  COORDINATION/GAIT: Normal finger-to-  nose-finger.  Gait narrow based and stable. Unsteady with tandem gait intact.   Data: Labs 09/02/2013:  Ceruloplasmin 43, copper 171, fT4 0.98, fT3 2.6, SPEP/UPEP with IFE, 2hr GTT 100*/121/123 Labs 07/05/2013:  Vitamin B12 1225, HbA1c 6.0, TSH 1.69  EMG left arm 09/04/2013:  Left median neuropathy at or distal to the wrist (consistent with carpal tunnel syndrome), mild in degree electrically, based the prolongation of the median sensory distal latencies.   IMPRESSION/PLAN:  1.  Ocular migraines, worsening  - Check ESR, TSH   - Imaging if symptoms do not improve  - Start topamax 69m x 1 week and increase to 511mdaily thereafter.  We discussed adding verapamil or propranolol for headaches, but she wishes to be on a once-daily dosing medication  2.  Secondary stroke risk factor prevention discussed including starting aspirin 8m and BP control.  She is having wellness exam with her PCP coming up.    3. Left CTS s/p release (08/2013), stable  4.  Idiopathic peripheral neuropathy, mild worsening  - Neuropathy labs are normal, except mildly abnormal glucose tolerance test  - Discussed to monitor symptoms as there are still subtle signs of neuropathy on exam  5.  Hyperreflexia likely due to cervical spondylosis with possible canal stenosis  - Since she remains asymptomatic, will follow  - If she develops new neck pain or weakness, will image as needed  Return to clinic in 3 months   The duration of this appointment visit was 30 minutes of face-to-face time with the patient.  Greater than 50% of this time was spent in counseling, explanation of diagnosis, planning of further management, and coordination of care.   Thank you for allowing me to participate in patient's care.  If I can answer any additional questions, I would be pleased to do so.    Sincerely,    Kamilo Och K. PPosey Pronto DO

## 2015-06-15 ENCOUNTER — Telehealth: Payer: Self-pay | Admitting: Family Medicine

## 2015-06-15 NOTE — Telephone Encounter (Signed)
That's fine with me if ok with Dr. Maudie Mercury. I don't have any availability until 2017 as far as I know though.

## 2015-06-15 NOTE — Telephone Encounter (Signed)
Ok with me 

## 2015-06-15 NOTE — Telephone Encounter (Signed)
Patient would like to transfer to Dr. Yong Channel.  Please advise if okay.

## 2015-06-17 NOTE — Telephone Encounter (Signed)
done

## 2015-07-23 ENCOUNTER — Encounter: Payer: Self-pay | Admitting: Internal Medicine

## 2015-07-29 ENCOUNTER — Telehealth: Payer: Self-pay | Admitting: *Deleted

## 2015-07-29 ENCOUNTER — Ambulatory Visit (INDEPENDENT_AMBULATORY_CARE_PROVIDER_SITE_OTHER): Payer: Medicare Other | Admitting: Neurology

## 2015-07-29 ENCOUNTER — Encounter: Payer: Self-pay | Admitting: Neurology

## 2015-07-29 VITALS — BP 128/80 | HR 59 | Ht 66.0 in | Wt 161.2 lb

## 2015-07-29 DIAGNOSIS — M25532 Pain in left wrist: Secondary | ICD-10-CM

## 2015-07-29 DIAGNOSIS — G609 Hereditary and idiopathic neuropathy, unspecified: Secondary | ICD-10-CM

## 2015-07-29 DIAGNOSIS — R2681 Unsteadiness on feet: Secondary | ICD-10-CM | POA: Diagnosis not present

## 2015-07-29 DIAGNOSIS — G43109 Migraine with aura, not intractable, without status migrainosus: Secondary | ICD-10-CM

## 2015-07-29 DIAGNOSIS — G43809 Other migraine, not intractable, without status migrainosus: Secondary | ICD-10-CM

## 2015-07-29 DIAGNOSIS — R292 Abnormal reflex: Secondary | ICD-10-CM | POA: Diagnosis not present

## 2015-07-29 NOTE — Progress Notes (Signed)
[facility]  Follow-up Visit   Date: 07/29/2015   Adriana Hendricks MRN: [id number] DOB: [birth year]   Interim History: Adriana Hendricks is a 80 y.o. year-old right-handed Caucasian female with history of GERD, hypertension, and left CTS s/p release (2014) returning to the clinic for new complaints of ocular migraines.  The patient was accompanied to the clinic by her husband.    History of present illness: Since February 2013, she had experienced left shoulder pain and reduced range of motion and was later found to have a partial left rotator cuff tear. She was doing physical therapy and in March 2014, during which time she developed acute onset of numbness involving the medial thumb and lateral index finger. Symptoms are worse when she wakes up.  There is associated left hand weakness, such as when opening jars. She also has has chronic shoulder pain, which limits her strength so is unsure if that is contributing to her weakness  - Follow-up 10/02/2013:  She had an EMG on 9/10 which showed mild left carpal tunnel syndrome.  Since then, she underwent left rotator cuff repair and left carpal tunnel release on 9/16.  She has noticed 80% improvement of numbness/tingling of her thumb and index finger. There is also history of bilateral feet numbness which has been present for a number of years and gait unsteadiness.  UPDATE 09/25/2014:  She is doing great with no new neurological complaints.    UPDATE 06/12/2015:  She reports having ocular migraines for several years which would occur once per month, described as jagged appearance with loss of central vision involving both eyes, lasting 45 minutes.  During this time, she cannot drive or read because it interferes with her vision.  Over the past month, the frequency has increased to every 2-3 days, lasting 45 minutes. She does not have associated headaches.  She is not taking a daily aspirin.  No family history of migraines.   Her  left hand is doing well and she continues to wear her wrist splint.   UPDATE 07/29/2015: At her last visit, I recommended that she start topamax, but she developed worsening pain around her left eye and stopped it.  She started using polarized sunglasses which has completely resolved her ocular migraines.  No further spells since June 2016.  She has noticed a fullness over the lateral wrist which becomes tender and causing sharp pain at times.  She has not tried ice or ibuprofen.  Her balance has also been a problem lately, especially when in the shower or barefoot.  There is no change in the numbness of her feet, other than new itching in her toes.   Medications:  Current Outpatient Prescriptions on File Prior to Visit  Medication Sig Dispense Refill  . ascorbic acid (VITAMIN C) 500 MG tablet Take 500 mg by mouth daily.    Marland Kitchen b complex vitamins tablet Take 1 tablet by mouth daily.    . beta carotene w/minerals (OCUVITE) tablet Take 1 tablet by mouth daily.    . Cholecalciferol (VITAMIN D3) 400 UNITS CAPS Take 1 capsule by mouth daily.    . nadolol-bendroflumethiazide (CORZIDE) 40-5 MG per tablet Take 1 tablet by mouth daily. 90 tablet 3  . NON FORMULARY Tumeric    . omeprazole (PRILOSEC) 20 MG capsule Take 1 capsule (20 mg total) by mouth daily. 30 capsule 3  . saccharomyces boulardii (FLORASTOR) 250 MG capsule Take by mouth.    . zinc sulfate 220 MG capsule Take  220 mg by mouth daily.     No current facility-administered medications on file prior to visit.    Allergies:  Allergies  Allergen Reactions  . Clarithromycin     REACTION: nausea  . Erythromycin Base Nausea And Vomiting  . Levofloxacin     Patient has a history of a Achilles tendon tear and developed tendon pain while on medication  . Metronidazole     REACTION: Rash  . Penicillins     Rash      Review of Systems:  CONSTITUTIONAL: No fevers, chills, night sweats, or weight loss.   EYES: No visual changes or eye  pain ENT: No hearing changes.  No history of nose bleeds.   RESPIRATORY: No cough, wheezing and shortness of breath.   CARDIOVASCULAR: Negative for chest pain, and palpitations.   GI: Negative for abdominal discomfort, blood in stools or black stools.  No recent change in bowel habits.   GU:  No history of incontinence.   MUSCLOSKELETAL: +history of joint pain or swelling.  No myalgias.   SKIN: Negative for lesions, rash, and itching.   HEMATOLOGY/ONCOLOGY: Negative for prolonged bleeding, bruising easily, and swollen nodes.  ENDOCRINE: Negative for cold or heat intolerance, polydipsia or goiter.   PSYCH:  No depression or anxiety symptoms.   NEURO: As Above.   Vital Signs:  BP 128/80 mmHg  Pulse 59  Ht 5' 6" (1.676 m)  Wt 161 lb 3 oz (73.114 kg)  BMI 26.03 kg/m2  SpO2 96%  Neurological Exam: MENTAL STATUS including orientation to time, place, person, recent and remote memory, attention span and concentration, language, and fund of knowledge is normal.  Speech is not dysarthric.  CRANIAL NERVES:    Normal fundoscopic exam.  Visual fields are intact.  Extraocular muscle are intact.  No ptosis.  Face is symmetric.  Tongue is midline.  MOTOR: Motor strength is 5/5 throughout, including distal hand and feet muscles. Normal tone.  Very slight low amplitude tremor of the right hand.   MSRs:   Right      Left  brachioradialis  3+   brachioradialis  3+  biceps  3+   biceps  3+  triceps  3+   triceps  3+  patellar  3+   patellar  3+   ankle jerk  trace   ankle jerk  trace   Hoffman  no   Hoffman  no   plantar response  down   plantar response  down    SENSORY:  Vibration reduced at the great toes.  COORDINATION/GAIT: Normal finger-to- nose-finger.  Mild bradykinesia with finger tapping on the right.  Gait narrow based and stable. Unsteady with tandem gait intact.   Data: Labs 09/02/2013:  Ceruloplasmin 43, copper 171, fT4 0.98, fT3 2.6, SPEP/UPEP with IFE, 2hr GTT 100*/121/123 Labs  07/05/2013:  Vitamin B12 1225, HbA1c 6.0, TSH 1.69 Labs 06/12/2015:  ESR 11, TSH 2.24  EMG left arm 09/04/2013:  Left median neuropathy at or distal to the wrist (consistent with carpal tunnel syndrome), mild in degree electrically, based the prolongation of the median sensory distal latencies.   IMPRESSION/PLAN:  1.  Ocular migraines, resolved   - Did not tolerate topamax due to worsening headaches, but since using polarized sun glasses she is doing much better  2.  Secondary stroke risk factor prevention discussed including starting aspirin 9m and BP control.   3. Left CTS s/p release (08/2013), stable  4.  Idiopathic peripheral neuropathy, mild worsening  - Neuropathy  labs are normal, except mildly abnormal glucose tolerance test  - Consider EMG of the legs if pain worsens  - Start PT for balance training  - Itching is unlikely to represent neuropathy   5.  Hyperreflexia likely due to cervical spondylosis with possible canal stenosis  - Since she remains asymptomatic, will follow  - If she develops new neck pain or weakness, low threshold to obtain cervical MRI  6.  Benign essential tremor, left.  Stable.  7.  Left wrist pain and fullness, follow up with PCP as this does not seems to be neuropathic pain  Return to clinic as needed   The duration of this appointment visit was 25 minutes of face-to-face time with the patient.  Greater than 50% of this time was spent in counseling, explanation of diagnosis, planning of further management, and coordination of care.   Thank you for allowing me to participate in patient's care.  If I can answer any additional questions, I would be pleased to do so.    Sincerely,    Donika K. Posey Pronto, DO

## 2015-07-29 NOTE — Patient Instructions (Addendum)
1.  Start PT for balance therapy 2.  Return to clinic as needed

## 2015-07-29 NOTE — Telephone Encounter (Signed)
No show letter sent for missed EMG on 07/28/2015 

## 2015-08-05 ENCOUNTER — Encounter: Payer: Self-pay | Admitting: Family Medicine

## 2015-08-05 ENCOUNTER — Ambulatory Visit (INDEPENDENT_AMBULATORY_CARE_PROVIDER_SITE_OTHER): Payer: Medicare Other | Admitting: Family Medicine

## 2015-08-05 VITALS — BP 128/80 | HR 65 | Temp 97.9°F | Ht 66.25 in | Wt 160.8 lb

## 2015-08-05 DIAGNOSIS — G252 Other specified forms of tremor: Secondary | ICD-10-CM

## 2015-08-05 DIAGNOSIS — Z0000 Encounter for general adult medical examination without abnormal findings: Secondary | ICD-10-CM

## 2015-08-05 DIAGNOSIS — K2100 Gastro-esophageal reflux disease with esophagitis, without bleeding: Secondary | ICD-10-CM

## 2015-08-05 DIAGNOSIS — M8580 Other specified disorders of bone density and structure, unspecified site: Secondary | ICD-10-CM

## 2015-08-05 DIAGNOSIS — R739 Hyperglycemia, unspecified: Secondary | ICD-10-CM

## 2015-08-05 DIAGNOSIS — G250 Essential tremor: Secondary | ICD-10-CM

## 2015-08-05 DIAGNOSIS — E785 Hyperlipidemia, unspecified: Secondary | ICD-10-CM

## 2015-08-05 DIAGNOSIS — I10 Essential (primary) hypertension: Secondary | ICD-10-CM | POA: Diagnosis not present

## 2015-08-05 DIAGNOSIS — R251 Tremor, unspecified: Secondary | ICD-10-CM

## 2015-08-05 DIAGNOSIS — E2839 Other primary ovarian failure: Secondary | ICD-10-CM

## 2015-08-05 LAB — LIPID PANEL
Cholesterol: 186 mg/dL (ref 0–200)
HDL: 64.5 mg/dL (ref >39.00)
LDL CALC: 106 mg/dL — AB (ref 0–99)
NONHDL: 121.93
Total CHOL/HDL Ratio: 3
Triglycerides: 82 mg/dL (ref 0.0–149.0)
VLDL: 16.4 mg/dL (ref 0.0–40.0)

## 2015-08-05 LAB — BASIC METABOLIC PANEL
BUN: 12 mg/dL (ref 6–23)
CHLORIDE: 92 meq/L — AB (ref 96–112)
CO2: 30 meq/L (ref 19–32)
CREATININE: 0.64 mg/dL (ref 0.40–1.20)
Calcium: 9.6 mg/dL (ref 8.4–10.5)
GFR: 95.37 mL/min (ref >60.00)
Glucose, Bld: 98 mg/dL (ref 70–99)
Potassium: 4.3 mEq/L (ref 3.5–5.1)
Sodium: 131 mEq/L — ABNORMAL LOW (ref 135–145)

## 2015-08-05 LAB — HEMOGLOBIN A1C: Hgb A1c MFr Bld: 5.6 % (ref 4.6–6.5)

## 2015-08-05 NOTE — Progress Notes (Signed)
Pre visit review using our clinic review tool, if applicable. No additional management support is needed unless otherwise documented below in the visit note. 

## 2015-08-05 NOTE — Addendum Note (Signed)
Addended by: Agnes Lawrence on: 08/05/2015 08:51 AM   Modules accepted: Orders

## 2015-08-05 NOTE — Patient Instructions (Addendum)
BEFORE YOU LEAVE: -Wendie Simmer, can you check on her bone density order - this was placed at her last visit in April  Vit D3 1000 IU, Adequate dietary calcium 1200mg   Please get your flu shot when available  -We have ordered labs or studies at this visit. It can take up to 1-2 weeks for results and processing. We will contact you with instructions IF your results are abnormal. Normal results will be released to your [facility]. If you have not heard from [place] or can not find your results in [facility] in 2 weeks please contact our office.  -PLEASE SIGN UP FOR MYCHART TODAY   We recommend the following healthy lifestyle measures: - eat a healthy diet consisting of lots of vegetables, fruits, beans, nuts, seeds, healthy meats such as white chicken and fish and whole grains.  - avoid fried foods, fast food, processed foods, sodas, red meet and other fattening foods.  - get a least 150 minutes of aerobic exercise per week.

## 2015-08-05 NOTE — Progress Notes (Signed)
Medicare Annual Preventive Care Visit  (initial annual wellness or annual wellness exam)  Concerns and/or follow up today:  GERD/hx colon polyps: -takes zantac an omeprazole 20mg  intermittently for this for a long time - she wants a refill on this -sees Dr. Olevia Perches -hx stricture, hiatal hernia, esophagitis -she doesn't like taking the PPI so using ranitidine instead  HTN: -meds: nadolol-bendroflumethiazide -denies: CP, SOB, DOE  Hx Osteopenia: -she wanted to repeat bone density at last visit and order was placed  Hx peripheral neuropathy, hypereflexia, cervical spondylosis, benign tremor, ocular migraines: -sees neurology for this -recent notes reviewed, she is getting PT currently  ROS: negative for report of fevers, unintentional weight loss, vision changes, vision loss, hearing loss or change, chest pain, sob, hemoptysis, melena, hematochezia, hematuria, genital discharge or lesions, falls, bleeding or bruising, loc, thoughts of suicide or self harm, memory loss  1.) Patient-completed health risk assessment  - completed and reviewed, see scanned documentation  2.) Review of Medical History: -PMH, PSH, Family History and current specialty and care providers reviewed and updated and listed below  - see scanned in document in chart and below  Past Medical History  Diagnosis Date  . Hypertension   . Diverticulosis   . Colon polyps     Tubular adenoma and hyperplastic  . Collagenous colitis 2005  . GERD (gastroesophageal reflux disease)     hx hiatal hernia, hx esophagitis, hx stricture  . Hx: UTI (urinary tract infection)   . Heart murmur   . Dental infection 10/25/2014  . Lower extremity neuropathy 08/23/2013    On B12 therapy with numbness in the feet bilaterally no evidence of diabetes   . Carpal tunnel syndrome 09/04/2013  . Essential tremor   . Achilles tendon rupture   . Ocular migraine     jagged vision, a few per month  . Peripheral neuropathy     treated by Dr  Posey Pronto (07/2015)    Past Surgical History  Procedure Laterality Date  . Appendectomy    . Tonsillectomy    . Cataract extraction    . Moles removed    . Excision morton's neuroma      Right foot  . Mouth surgery      (For Exostosis)  . Intraocular lens implant, secondary    . Carpal tunnel release    . Rotator cuff repair      Social History   Social History  . Marital Status: Married    Spouse Name: N/A  . Number of Children: 2  . Years of Education: N/A   Occupational History  . Retired    Social History Main Topics  . Smoking status: Former Research scientist (life sciences)  . Smokeless tobacco: Never Used     Comment: Quit in 1969  . Alcohol Use: 1.2 oz/week    2 Glasses of wine per week     Comment: 2 glasses of wine 4-6 nights per week  . Drug Use: No  . Sexual Activity: Yes   Other Topics Concern  . Not on file   Social History Narrative   Retired from [place] [employer].  Lives with husband of 80 years in a one-story home.                The patient has a family history of  3.) Review of functional ability and level of safety:  Any difficulty hearing? NO  History of falling? NO  Any trouble with IADLs - using a phone, using  transportation, grocery shopping, preparing meals, doing housework, doing laundry, taking medications and managing money? NO  Advance Directives? YES   See summary of recommendations in Patient Instructions below.  4.) Physical Exam Filed Vitals:   08/05/15 0802  BP: 128/80  Pulse: 65  Temp: 97.9 F (36.6 C)   Estimated body mass index is 25.75 kg/(m^2) as calculated from the following:   Height as of this encounter: 5' 6.25" (1.683 m).   Weight as of this encounter: 160 lb 12.8 oz (72.938 kg).  EKG (optional): deferred  General: alert, appear well hydrated and in no acute distress  HEENT: visual acuity grossly intact  CV: HRRR  Lungs: CTA bilaterally  Psych: pleasant and cooperative, no obvious  depression or anxiety  Cognitive function grossly intact  See patient instructions for recommendations.  Education and counseling regarding the above review of health provided with a plan for the following: -see scanned patient completed form for further details -fall prevention strategies discussed  -healthy lifestyle discussed -importance and resources for completing advanced directives discussed -see patient instructions below for any other recommendations provided  4)The following written screening schedule of preventive measures were reviewed with assessment and plan made per below, orders and patient instructions:       Alcohol screening: done     Obesity Screening and counseling: done     STI screening (Hep C if born 1943-65): declined     Tobacco Screening: done       Pneumococcal (PPSV23 -one dose after 64, one before if risk factors), influenza yearly and hepatitis B vaccines (if high risk - end stage renal disease, IV drugs, homosexual men, live in home for mentally retarded, hemophilia receiving factors) ASSESSMENT/PLAN: done      Screening mammograph (yearly if >40) ASSESSMENT/PLAN: declined      Colorectal cancer screening (FOBT yearly or flex sig q4y or colonoscopy q10y or barium enema q4y) ASSESSMENT/PLAN: n/a      Diabetes outpatient self-management training services ASSESSMENT/PLAN: n/a      Bone mass measurements(covered q2y if indicated - estrogen def, osteoporosis, hyperparathyroid, vertebral abnormalities, osteoporosis or steroids) ASSESSMENT/PLAN: ordered      Screening for glaucoma(q1y if high risk - diabetes, FH, AA and > 50 or hispanic and > 65) ASSESSMENT/PLAN: sees opthomology      Medical nutritional therapy for individuals with diabetes or renal disease ASSESSMENT/PLAN: n/a      Cardiovascular screening blood tests (lipids q5y) ASSESSMENT/PLAN: hx mild hyperlipidemia, doing today      Diabetes screening tests ASSESSMENT/PLAN: doing  today   7.) Summary:  Medicare annual wellness visit, subsequent -risk factors and conditions per above assessment were discussed and treatment, recommendations and referrals were offered per documentation above and orders and patient instructions.  Gastroesophageal reflux disease with esophagitis -she is doing trial of ranitidine instead of PPI as is concerned for side effects with PPI - discussed risks/benefits  Osteopenia -will have assistant check on status of bone denisity test -discussed tx options  Essential hypertension - Plan: Basic metabolic panel -stable, cont current medications  Hyperlipemia - Plan: Lipid panel -lifestyle recs  Hyperglycemia - Plan: Hemoglobin A1C  Patient Instructions  BEFORE YOU LEAVE: -Wendie Simmer, can you check on her bone density order - this was placed at her last visit in April  Vit D3 1000 IU, Adequate dietary calcium 1200mg   Please get your flu shot when available  -We have ordered labs or studies at this visit. It can take up to 1-2 weeks for results  and processing. We will contact you with instructions IF your results are abnormal. Normal results will be released to your [facility]. If you have not heard from [place] or can not find your results in [facility] in 2 weeks please contact our office.  -PLEASE SIGN UP FOR MYCHART TODAY   We recommend the following healthy lifestyle measures: - eat a healthy diet consisting of lots of vegetables, fruits, beans, nuts, seeds, healthy meats such as white chicken and fish and whole grains.  - avoid fried foods, fast food, processed foods, sodas, red meet and other fattening foods.  - get a least 150 minutes of aerobic exercise per week.

## 2015-08-06 ENCOUNTER — Ambulatory Visit: Payer: Medicare Other | Attending: Neurology

## 2015-08-06 DIAGNOSIS — R2681 Unsteadiness on feet: Secondary | ICD-10-CM | POA: Diagnosis not present

## 2015-08-06 DIAGNOSIS — R269 Unspecified abnormalities of gait and mobility: Secondary | ICD-10-CM | POA: Insufficient documentation

## 2015-08-06 NOTE — Therapy (Signed)
[facility] [address], [place], [place], [address] Phone: [phone]   Fax:  [phone]  Physical Therapy Evaluation  Patient Details  Name: Adriana Hendricks MRN: [id number] Date of Birth: 10-25-1935 Referring Provider:  Alda Berthold, DO  Encounter Date: 08/06/2015      PT End of Session - 08/06/15 1001    Visit Number 1   Number of Visits 10   Date for PT Re-Evaluation 10/01/15   PT Start Time 0930   PT Stop Time 1010   PT Time Calculation (min) 40 min   Activity Tolerance Patient tolerated treatment well   Behavior During Therapy [facility] for tasks assessed/performed      Past Medical History  Diagnosis Date  . Hypertension   . Diverticulosis   . Colon polyps     Tubular adenoma and hyperplastic  . Collagenous colitis 2005  . GERD (gastroesophageal reflux disease)     hx hiatal hernia, hx esophagitis, hx stricture  . Hx: UTI (urinary tract infection)   . Heart murmur   . Dental infection 10/25/2014  . Lower extremity neuropathy 08/23/2013    On B12 therapy with numbness in the feet bilaterally no evidence of diabetes   . Carpal tunnel syndrome 09/04/2013  . Essential tremor   . Achilles tendon rupture   . Ocular migraine     jagged vision, a few per month  . Peripheral neuropathy     treated by Dr Posey Pronto (07/2015)    Past Surgical History  Procedure Laterality Date  . Appendectomy    . Tonsillectomy    . Cataract extraction    . Moles removed    . Excision morton's neuroma      Right foot  . Mouth surgery      (For Exostosis)  . Intraocular lens implant, secondary    . Carpal tunnel release    . Rotator cuff repair      There were no vitals filed for this visit.  Visit Diagnosis:  Unsteadiness - Plan: PT plan of care cert/re-cert  Abnormality of gait - Plan: PT plan of care cert/re-cert      Subjective Assessment - 08/06/15 0933    Subjective Pt reports to PT with complaints of  feeling unsteady due to peripheral neuropathy. She feels that she is staggering when she walks. Pt reports that this has been going on ~9 months.     Pertinent History peripheral neuropathy in bil. legs   Patient Stated Goals improve balance and gait   Currently in Pain? 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  No   Is the patient reluctant to leave their home because of a fear of falling?  No   Home Environment   Living Environment Private residence   Living Arrangements Spouse/significant other   Type of [place] to enter   Entrance Stairs-Number of Steps  3   Entrance Stairs-Rails Right   Home Layout One level   Prior Function   Level of Independence Independent   Vocation Retired   Leisure pt likes to walk for exercise   Cognition   Overall Cognitive Status Within Functional Limits for tasks assessed   Posture/Postural Control   Posture/Postural Control Postural limitations   Postural Limitations Rounded Shoulders;Forward head   ROM / Strength   AROM / PROM / Strength Strength   Strength   Overall Strength Deficits   Overall Strength Comments Bilateral hips 4/5, knrrd 4+/5, DF 4+/5   Ambulation/Gait   Ambulation/Gait Yes   Ambulation/Gait Assistance 7: Independent   Ambulation Distance (Feet) 100 Feet   Gait Pattern Within Functional Limits;Scissoring  mild staggering   Standardized Balance Assessment   Standardized Balance Assessment Berg Balance Test   Berg Balance Test   Sit to Stand  Able to stand without using hands and stabilize independently   Standing Unsupported Able to stand safely 2 minutes   Sitting with Back Unsupported but Feet Supported on Floor or Stool Able to sit safely and securely 2 minutes   Stand to Sit Sits safely with minimal use of hands   Transfers Able to transfer safely, minor use of hands   Standing Unsupported with Eyes Closed Able to stand 10 seconds safely   Standing Ubsupported with Feet Together Able to place feet together independently and stand 1 minute safely   From Standing, Reach Forward with Outstretched Arm Reaches forward but needs supervision   From Standing Position, Pick up Object from [place] to pick up shoe safely and easily   From Standing Position, Turn to Look Behind Over each Shoulder Looks behind from both sides and weight shifts well   Turn 360 Degrees Able to turn 360 degrees safely in 4 seconds or less   Standing Unsupported, Alternately Place Feet on Step/Stool Able to stand independently and safely and complete 8 steps in 20 seconds   Standing Unsupported, One Foot in Front Able to take small step independently and hold 30 seconds   Standing on One Leg Able to lift leg independently and hold 5-10 seconds   Total Score 50                           PT Education - 08/06/15 1000    Education provided Yes   Education Details HEP: tandem stance, single leg stance and bil. hip abduction at the counter top   Person(s) Educated Patient   Methods Explanation;Demonstration;Handout   Comprehension Returned demonstration;Verbalized understanding          PT Short Term Goals - 08/06/15 1008    PT SHORT TERM GOAL #1   Title be independent in initial HEP   Time 4   Period Weeks   Status New   PT SHORT TERM GOAL #2   Title perform single leg stance at the counter x 8 seconds without UE support and minimal instability    Time 4   Period Weeks   Status New           PT Long Term Goals - 08/06/15  1009    PT LONG TERM GOAL #1   Title be independent in advanced HEP   Time 8   Period Weeks   Status New   PT LONG TERM GOAL #2   Title improve Berg Balance score to > or = to 54/56 to reduce falls risk  Time 8   Period Weeks   Status New   PT LONG TERM GOAL #3   Title demonstrate gait on level surface without staggering or instability   Time 8   Period Weeks   Status New               Plan - August 20, 2015 1002    Clinical Impression Statement Pt presents to PT with complaints of a 1 year history of unsteadiness on her feet secondary to progression of LE peripheral neuropathy.  Pt scored 50/56 on the Berg Balance score indicating moderate falls risk, and demonstrates mild staggering and instability with level surface ambulation.  Pt has not had any falls and will benefit from skilled PT for balance and stability training to prevent falls especially on unlevel surface and in the community.     Pt will benefit from skilled therapeutic intervention in order to improve on the following deficits Abnormal gait;Decreased strength;Decreased balance   Rehab Potential Good   PT Frequency 2x / week   PT Duration 8 weeks   PT Treatment/Interventions ADLs/Self Care Home Management;Functional mobility training;Stair training;Gait training;Neuromuscular re-education;Therapeutic exercise;Balance training;Therapeutic activities   PT Next Visit Plan Balance and gait training   Consulted and Agree with Plan of Care Patient          G-Codes - Aug 20, 2015 1007    Functional Assessment Tool Used Berg Balance Test: 50/56   Functional Limitation Mobility: Walking and moving around   Mobility: Walking and Moving Around Current Status [phone]) At least 1 percent but less than 20 percent impaired, limited or restricted   Mobility: Walking and Moving Around Goal Status [phone]) At least 1 percent but less than 20 percent impaired, limited or restricted       Problem List Patient Active Problem List    Diagnosis Date Noted  . Carpal tunnel syndrome 09/04/2013  . Lower extremity neuropathy 08/23/2013  . ACHILLES TENDON TEAR 01/17/2011  . Hyperlipemia 01/07/2010  . GASTROESOPHAGEAL REFLUX DISEASE 06/19/2009  . ACNE ROSACEA 04/03/2008  . TREMOR, ESSENTIAL 11/16/2007  . Essential hypertension 11/16/2007    Rashod Gougeon, PT 08/20/15, 10:13 AM  [facility] [address], [place], [place], [address] Phone: [phone]   Fax:  [phone]

## 2015-08-06 NOTE — Patient Instructions (Signed)
Do these at the counter top with arm support for safety  Tandem Stance   Right foot in front of left, heel touching toe both feet "straight ahead". Stand on Foot Triangle of Support with both feet. Balance in this position _10__ seconds, do 3-5 times on each leg.  2-3 times a day Do with left foot in front of right.  Single Leg Balance: Eyes Open   Stand on right leg with eyes open. Hold _10__ seconds. _3-5__ reps _2-3__ times per day.  ABDUCTION: Standing (Power)   Stand, feet flat. Lift right leg out to side slowly.  Use ___ lbs. Complete _2__ sets of __10_ repetitions. Perform 2-3___ sessions per day.  Copyright  VHI. All rights reserved.   [place] [name], [place], [place] [address] Phone # [phone] Fax [phone]

## 2015-08-12 ENCOUNTER — Encounter: Payer: Self-pay | Admitting: Family Medicine

## 2015-08-12 ENCOUNTER — Ambulatory Visit: Payer: Medicare Other | Admitting: Rehabilitation

## 2015-08-12 DIAGNOSIS — R2681 Unsteadiness on feet: Secondary | ICD-10-CM | POA: Diagnosis not present

## 2015-08-12 DIAGNOSIS — R269 Unspecified abnormalities of gait and mobility: Secondary | ICD-10-CM | POA: Diagnosis not present

## 2015-08-12 NOTE — Therapy (Signed)
[facility] [address], [place], [place], [address] Phone: [phone]   Fax:  [phone]  Physical Therapy Treatment  Patient Details  Name: Adriana Hendricks MRN: [id number] Date of Birth: 02/17/1935 Referring Provider:  Alda Berthold, DO  Encounter Date: 08/12/2015      PT End of Session - 08/12/15 1448    Visit Number 2   Number of Visits 10   Date for PT Re-Evaluation 10/01/15   PT Start Time 1751   PT Stop Time 1523   PT Time Calculation (min) 38 min      Past Medical History  Diagnosis Date  . Hypertension   . Diverticulosis   . Colon polyps     Tubular adenoma and hyperplastic  . Collagenous colitis 2005  . GERD (gastroesophageal reflux disease)     hx hiatal hernia, hx esophagitis, hx stricture  . Hx: UTI (urinary tract infection)   . Heart murmur   . Dental infection 10/25/2014  . Lower extremity neuropathy 08/23/2013    On B12 therapy with numbness in the feet bilaterally no evidence of diabetes   . Carpal tunnel syndrome 09/04/2013  . Essential tremor   . Achilles tendon rupture   . Ocular migraine     jagged vision, a few per month  . Peripheral neuropathy     treated by Dr Posey Pronto (07/2015)    Past Surgical History  Procedure Laterality Date  . Appendectomy    . Tonsillectomy    . Cataract extraction    . Moles removed    . Excision morton's neuroma      Right foot  . Mouth surgery      (For Exostosis)  . Intraocular lens implant, secondary    . Carpal tunnel release    . Rotator cuff repair      There were no vitals filed for this visit.  Visit Diagnosis:  Unsteadiness  Abnormality of gait      Subjective Assessment - 08/12/15 1450    Subjective Pt reports feeling more steady already and reports complaince with HEP.    Currently in Pain? No/denies                         [facility] Adult PT Treatment/Exercise - 08/12/15 1450    Balance   Balance Assessed  --   Static Standing Balance   Tandem Stance - Right Leg --  30 seconds then 2x20" on blue foam with 1 pole/ min-CGA   Tandem Stance - Left Leg --  30 seconds then 2x20" on blue foam with 1 pole/ MIn-CGA   Dynamic Standing Balance   Head turns comments: narrow standing on blue foam    Head nods comments: narrrow standing on blue foam   Exercises   Exercises Knee/Hip   Knee/Hip Exercises: Standing   Heel Raises Both;10 reps   Heel Raises Limitations on blue foam with 2 pole assist   Hip Flexion Both;10 reps  slow march   Hip Flexion Limitations on blue foam with 2 pole assist   Hip Abduction Both;10 reps   Abduction Limitations on blue foam with 2 pole assist   Functional Squat 10 reps   Functional Squat Limitations needs verbal, visual cues to no go too deep and proper mechanics   Other Standing Knee Exercises Toe tape to 6" Step from Blue Foam with 1-2 pole assist   Knee/Hip Exercises: Seated   Clamshell with TheraBand  Green  x10   Abduction/Adduction  10 reps  single leg moving x10 each    Abd/Adduction Limitations green Tb                   PT Short Term Goals - 08/12/15 1526    PT SHORT TERM GOAL #1   Title be independent in initial HEP   Status Achieved   PT SHORT TERM GOAL #2   Title perform single leg stance at the counter x 8 seconds without UE support and minimal instability    Status On-going           PT Long Term Goals - 08/12/15 1526    PT LONG TERM GOAL #1   Title be independent in advanced HEP   Status On-going   PT LONG TERM GOAL #2   Title improve Berg Balance score to > or = to 54/56 to reduce falls risk   Status On-going   PT LONG TERM GOAL #3   Title demonstrate gait on level surface without staggering or instability   Status On-going               Plan - 08/12/15 1523    Clinical Impression Statement Pt tolerate all exerises well and continues to have difficulty with SLS exercises requiring balance pole assist and min-CGA  on occation (primarily with tandam stance on blue foam). Able to perform toe taps well with minimal difficulty as well as seated hip exercises. Pt seems complaint with HEP and very motivated to get better. Will begin more gait training at next visit due to primary focus on balance today.    PT Next Visit Plan Balance and gait training   Consulted and Agree with Plan of Care Patient        Problem List Patient Active Problem List   Diagnosis Date Noted  . Carpal tunnel syndrome 09/04/2013  . Lower extremity neuropathy 08/23/2013  . ACHILLES TENDON TEAR 01/17/2011  . Hyperlipemia 01/07/2010  . GASTROESOPHAGEAL REFLUX DISEASE 06/19/2009  . ACNE ROSACEA 04/03/2008  . TREMOR, ESSENTIAL 11/16/2007  . Essential hypertension 11/16/2007    Barbette Hair, PTA 08/12/2015, 3:27 PM  [facility] [address] [place], [place], [address] Phone: [phone]   Fax:  [phone]

## 2015-08-19 ENCOUNTER — Ambulatory Visit: Payer: Medicare Other

## 2015-08-19 DIAGNOSIS — R2681 Unsteadiness on feet: Secondary | ICD-10-CM | POA: Diagnosis not present

## 2015-08-19 DIAGNOSIS — R269 Unspecified abnormalities of gait and mobility: Secondary | ICD-10-CM

## 2015-08-19 NOTE — Patient Instructions (Signed)
Knee High   Holding stable object, raise knee to hip level, then lower knee. Repeat with other knee. Complete __10_ repetitions. Do __2__ sessions per day.  EXTENSION: Standing (Active)  Stand, both feet flat. Draw right leg behind body as far as possible. Use 0___ lbs. Complete 10 repetitions. Perform __2_ sessions per day.  Copyright  VHI. All rights reserved.   [place] [address] [place], [place] [address] Phone # [phone] Fax [phone]

## 2015-08-19 NOTE — Therapy (Signed)
[facility] [address] [place], [place], [address] Phone: [phone]   Fax:  [phone]  Physical Therapy Treatment  Patient Details  Name: Adriana Hendricks MRN: [id number] Date of Birth: 1935-09-15 Referring Provider:  Lucretia Kern, DO  Encounter Date: 08/19/2015      PT End of Session - 08/19/15 1059    Visit Number 3   Number of Visits 10   Date for PT Re-Evaluation 10/01/15   PT Start Time 1020   PT Stop Time 1100   PT Time Calculation (min) 40 min   Activity Tolerance Patient tolerated treatment well   Behavior During Therapy [facility] for tasks assessed/performed      Past Medical History  Diagnosis Date  . Hypertension   . Diverticulosis   . Colon polyps     Tubular adenoma and hyperplastic  . Collagenous colitis 2005  . GERD (gastroesophageal reflux disease)     hx hiatal hernia, hx esophagitis, hx stricture  . Hx: UTI (urinary tract infection)   . Heart murmur   . Dental infection 10/25/2014  . Lower extremity neuropathy 08/23/2013    On B12 therapy with numbness in the feet bilaterally no evidence of diabetes   . Carpal tunnel syndrome 09/04/2013  . Essential tremor   . Achilles tendon rupture   . Ocular migraine     jagged vision, a few per month  . Peripheral neuropathy     treated by Dr Posey Pronto (07/2015)    Past Surgical History  Procedure Laterality Date  . Appendectomy    . Tonsillectomy    . Cataract extraction    . Moles removed    . Excision morton's neuroma      Right foot  . Mouth surgery      (For Exostosis)  . Intraocular lens implant, secondary    . Carpal tunnel release    . Rotator cuff repair      There were no vitals filed for this visit.  Visit Diagnosis:  Unsteadiness  Abnormality of gait      Subjective Assessment - 08/19/15 1022    Subjective Doing well with exercises.  Balance feeling like it is improving.     Currently in Pain? No/denies                          [facility] Adult PT Treatment/Exercise - 08/19/15 0001    Knee/Hip Exercises: Aerobic   Nustep seat 7, arms 9   Knee/Hip Exercises: Standing   Hip Flexion Stengthening;Both;3 sets;10 reps  marching   Hip Abduction Both;10 reps;Stengthening;3 sets  on blue pods   Abduction Limitations on blue foam pods   Rebounder weight shifting 3 ways x 1 min. each   Walking with Sports Cord 20# 4 ways x 10 reps forward and reverse and 5x bil. each   Other Standing Knee Exercises toe taps on 6" step 2x30 alternating without UE support                PT Education - 08/19/15 1048    Education provided Yes   Education Details HEP: standing extension and marching   Person(s) Educated Patient   Methods Explanation;Demonstration;Handout   Comprehension Verbalized understanding;Returned demonstration          PT Short Term Goals - 08/19/15 1024    PT SHORT TERM GOAL #1   Title be independent in initial HEP   Status Achieved   PT  SHORT TERM GOAL #2   Title perform single leg stance at the counter x 8 seconds without UE support and minimal instability    Time 4   Period Weeks   Status On-going           PT Long Term Goals - 08/12/15 1526    PT LONG TERM GOAL #1   Title be independent in advanced HEP   Status On-going   PT LONG TERM GOAL #2   Title improve Berg Balance score to > or = to 54/56 to reduce falls risk   Status On-going   PT LONG TERM GOAL #3   Title demonstrate gait on level surface without staggering or instability   Status On-going               Plan - 08/19/15 1059    Clinical Impression Statement Pt required stand by to contact guard assist with resisted walking.  Pt with continued balance deficits and limited single leg stance.  Pt will benefit from skilled PT to improve balance, gait and strength to improve safety.          Problem List Patient Active Problem List   Diagnosis Date Noted  . Carpal tunnel  syndrome 09/04/2013  . Lower extremity neuropathy 08/23/2013  . ACHILLES TENDON TEAR 01/17/2011  . Hyperlipemia 01/07/2010  . GASTROESOPHAGEAL REFLUX DISEASE 06/19/2009  . ACNE ROSACEA 04/03/2008  . TREMOR, ESSENTIAL 11/16/2007  . Essential hypertension 11/16/2007    Nicolus Ose, PT 08/19/2015, 11:03 AM  [facility] [address], [place], [place], [address] Phone: [phone]   Fax:  [phone]

## 2015-08-21 ENCOUNTER — Encounter: Payer: Medicare Other | Admitting: Physical Therapy

## 2015-08-24 ENCOUNTER — Encounter: Payer: Self-pay | Admitting: Physical Therapy

## 2015-08-24 ENCOUNTER — Ambulatory Visit: Payer: Medicare Other | Admitting: Physical Therapy

## 2015-08-24 DIAGNOSIS — R269 Unspecified abnormalities of gait and mobility: Secondary | ICD-10-CM

## 2015-08-24 DIAGNOSIS — R2681 Unsteadiness on feet: Secondary | ICD-10-CM | POA: Diagnosis not present

## 2015-08-24 NOTE — Therapy (Signed)
[facility] [address], [place], [place], [address] Phone: [phone]   Fax:  [phone]  Physical Therapy Treatment  Patient Details  Name: Adriana Hendricks MRN: [id number] Date of Birth: Oct 23, 1935 Referring Provider:  Alda Berthold, DO  Encounter Date: 08/24/2015      PT End of Session - 08/24/15 0947    Visit Number 4   Number of Visits 10   Date for PT Re-Evaluation 10/01/15   PT Start Time 0928   PT Stop Time 1015   PT Time Calculation (min) 47 min   Activity Tolerance Patient tolerated treatment well   Behavior During Therapy [facility] for tasks assessed/performed      Past Medical History  Diagnosis Date  . Hypertension   . Diverticulosis   . Colon polyps     Tubular adenoma and hyperplastic  . Collagenous colitis 2005  . GERD (gastroesophageal reflux disease)     hx hiatal hernia, hx esophagitis, hx stricture  . Hx: UTI (urinary tract infection)   . Heart murmur   . Dental infection 10/25/2014  . Lower extremity neuropathy 08/23/2013    On B12 therapy with numbness in the feet bilaterally no evidence of diabetes   . Carpal tunnel syndrome 09/04/2013  . Essential tremor   . Achilles tendon rupture   . Ocular migraine     jagged vision, a few per month  . Peripheral neuropathy     treated by Dr Posey Pronto (07/2015)    Past Surgical History  Procedure Laterality Date  . Appendectomy    . Tonsillectomy    . Cataract extraction    . Moles removed    . Excision morton's neuroma      Right foot  . Mouth surgery      (For Exostosis)  . Intraocular lens implant, secondary    . Carpal tunnel release    . Rotator cuff repair      There were no vitals filed for this visit.  Visit Diagnosis:  Unsteadiness  Abnormality of gait      Subjective Assessment - 08/24/15 0934    Subjective My balance is better and going down stairs is way better.   Currently in Pain? No/denies                          [facility] Adult PT Treatment/Exercise - 08/24/15 0001    Knee/Hip Exercises: Aerobic   Nustep 8 seat, L1 10 minc   Knee/Hip Exercises: Standing   Step Down Both;1 set;10 reps;Hand Hold: 2   SLS Bil 10 sec 3x   Rebounder weight shifting 3 ways x 1 min. each   Walking with Sports Cord 20# 6x each direction   Other Standing Knee Exercises toe taps 3x 20 sec   Other Standing Knee Exercises Standing foot tx with small soft ball.                   PT Short Term Goals - 08/24/15 2951    PT SHORT TERM GOAL #1   Title be independent in initial HEP   Period Weeks   Status Achieved           PT Long Term Goals - 08/12/15 1526    PT LONG TERM GOAL #1   Title be independent in advanced HEP   Status On-going   PT LONG TERM GOAL #2   Title improve Berg Balance score to > or =  to 54/56 to reduce falls risk   Status On-going   PT LONG TERM GOAL #3   Title demonstrate gait on level surface without staggering or instability   Status On-going               Plan - 08/24/15 0955    Clinical Impression Statement Pt able to single leg balance longer but still super unsteady when she performs the exercise. Going down stairs is 30% easier. Tried foot treatment to increase neuromuscular information.    Pt will benefit from skilled therapeutic intervention in order to improve on the following deficits Abnormal gait;Decreased strength;Decreased balance   Rehab Potential Good   PT Frequency 2x / week   PT Duration 8 weeks   PT Treatment/Interventions ADLs/Self Care Home Management;Functional mobility training;Stair training;Gait training;Neuromuscular re-education;Therapeutic exercise;Balance training;Therapeutic activities   PT Next Visit Plan Balance and gait training        Problem List Patient Active Problem List   Diagnosis Date Noted  . Carpal tunnel syndrome 09/04/2013  . Lower extremity neuropathy 08/23/2013  . ACHILLES TENDON  TEAR 01/17/2011  . Hyperlipemia 01/07/2010  . GASTROESOPHAGEAL REFLUX DISEASE 06/19/2009  . ACNE ROSACEA 04/03/2008  . TREMOR, ESSENTIAL 11/16/2007  . Essential hypertension 11/16/2007    Eisa Necaise, PTA 08/24/2015, 10:13 AM  [facility] [address] [place], [place], [address] Phone: [phone]   Fax:  [phone]

## 2015-08-27 ENCOUNTER — Ambulatory Visit: Payer: Medicare Other | Attending: Neurology

## 2015-08-27 DIAGNOSIS — R2681 Unsteadiness on feet: Secondary | ICD-10-CM | POA: Diagnosis not present

## 2015-08-27 DIAGNOSIS — R269 Unspecified abnormalities of gait and mobility: Secondary | ICD-10-CM | POA: Diagnosis not present

## 2015-08-27 NOTE — Therapy (Signed)
[facility] [address] [place], [place], [address] Phone: [phone]   Fax:  [phone]  Physical Therapy Treatment  Patient Details  Name: Adriana Hendricks MRN: [id number] Date of Birth: 01/29/1935 Referring Provider:  Lucretia Kern, DO  Encounter Date: 08/27/2015      PT End of Session - 08/27/15 1444    Visit Number 5   Number of Visits 10   Date for PT Re-Evaluation 10/01/15   PT Start Time 9211   PT Stop Time 1443   PT Time Calculation (min) 46 min   Activity Tolerance Patient tolerated treatment well   Behavior During Therapy [facility] for tasks assessed/performed      Past Medical History  Diagnosis Date  . Hypertension   . Diverticulosis   . Colon polyps     Tubular adenoma and hyperplastic  . Collagenous colitis 2005  . GERD (gastroesophageal reflux disease)     hx hiatal hernia, hx esophagitis, hx stricture  . Hx: UTI (urinary tract infection)   . Heart murmur   . Dental infection 10/25/2014  . Lower extremity neuropathy 08/23/2013    On B12 therapy with numbness in the feet bilaterally no evidence of diabetes   . Carpal tunnel syndrome 09/04/2013  . Essential tremor   . Achilles tendon rupture   . Ocular migraine     jagged vision, a few per month  . Peripheral neuropathy     treated by Dr Posey Pronto (07/2015)    Past Surgical History  Procedure Laterality Date  . Appendectomy    . Tonsillectomy    . Cataract extraction    . Moles removed    . Excision morton's neuroma      Right foot  . Mouth surgery      (For Exostosis)  . Intraocular lens implant, secondary    . Carpal tunnel release    . Rotator cuff repair      There were no vitals filed for this visit.  Visit Diagnosis:  Abnormality of gait  Unsteadiness      Subjective Assessment - 08/27/15 1356    Subjective I forgot my HEP handout and couldn't remember all of the new exercises.     Patient Stated Goals improve balance and  gait   Currently in Pain? No/denies                         [facility] Adult PT Treatment/Exercise - 08/27/15 0001    Knee/Hip Exercises: Aerobic   Nustep 8 seat, L2 10 min   Knee/Hip Exercises: Machines for Strengthening   Total Gym Leg Press 50# bil. 3x10  seat 6   Knee/Hip Exercises: Standing   Step Down Both;10 reps;Hand Hold: 2;2 sets   SLS Bil 10 sec 3x   Rebounder weight shifting 3 ways x 1 min. each   Walking with Sports Cord 20# 10x each direction  CGA for safety   Other Standing Knee Exercises toe taps 3x 20 sec                PT Education - 08/27/15 1404    Education provided Yes   Education Details HEP: alternating toe taps on step and step-downs   Person(s) Educated Patient   Methods Explanation;Demonstration;Handout   Comprehension Verbalized understanding;Returned demonstration          PT Short Term Goals - 08/24/15 0952    PT SHORT TERM GOAL #1   Title  be independent in initial HEP   Period Weeks   Status Achieved           PT Long Term Goals - 08/12/15 1526    PT LONG TERM GOAL #1   Title be independent in advanced HEP   Status On-going   PT LONG TERM GOAL #2   Title improve Berg Balance score to > or = to 54/56 to reduce falls risk   Status On-going   PT LONG TERM GOAL #3   Title demonstrate gait on level surface without staggering or instability   Status On-going               Plan - 08/27/15 1424    Clinical Impression Statement Pt reports increased ease with descending curbs and steps.  Pt tolerated leg press well.  Pt with continued balance and strength deficits.  Pt will benefit from skilled PT for balance and strength training to improve safety and endurance in the community.   Pt will benefit from skilled therapeutic intervention in order to improve on the following deficits Abnormal gait;Decreased strength;Decreased balance   Rehab Potential Good   PT Frequency 2x / week   PT Duration 8 weeks   PT  Treatment/Interventions ADLs/Self Care Home Management;Functional mobility training;Stair training;Gait training;Neuromuscular re-education;Therapeutic exercise;Balance training;Therapeutic activities   PT Next Visit Plan Balance and gait training   Consulted and Agree with Plan of Care Patient        Problem List Patient Active Problem List   Diagnosis Date Noted  . Carpal tunnel syndrome 09/04/2013  . Lower extremity neuropathy 08/23/2013  . ACHILLES TENDON TEAR 01/17/2011  . Hyperlipemia 01/07/2010  . GASTROESOPHAGEAL REFLUX DISEASE 06/19/2009  . ACNE ROSACEA 04/03/2008  . TREMOR, ESSENTIAL 11/16/2007  . Essential hypertension 11/16/2007    Sakshi Sermons, PT 08/27/2015, 2:45 PM  [facility] [address] [place], [place], [address] Phone: [phone]   Fax:  [phone]

## 2015-08-27 NOTE — Patient Instructions (Signed)
Anterior Step-Down   Stand with both feet on _6-8__ inch step. Step down in A direction with left foot, touching heel to the floor and return _10__ times. 1-2___ sets _1-2__ times per day.  http://gglj.exer.us/185   Copyright  VHI. All rights reserved.  Alternating Step   Take alternating steps on to a 6-8 inch step.   Repeat _2 x20___ times. Do __1-2__ sessions per day.  http://gt2.exer.us/494   Copyright  VHI. All rights reserved.  [place] [address] [place], [place] [address] Phone # [phone] Fax [phone]

## 2015-09-01 ENCOUNTER — Ambulatory Visit: Payer: Medicare Other

## 2015-09-01 DIAGNOSIS — R2681 Unsteadiness on feet: Secondary | ICD-10-CM | POA: Diagnosis not present

## 2015-09-01 DIAGNOSIS — R269 Unspecified abnormalities of gait and mobility: Secondary | ICD-10-CM

## 2015-09-01 NOTE — Therapy (Signed)
[facility] [address], [place], [place], [address] Phone: [phone]   Fax:  [phone]  Physical Therapy Treatment  Patient Details  Name: Adriana Hendricks MRN: [id number] Date of Birth: 1934/12/28 Referring Provider:  Alda Berthold, DO  Encounter Date: 09/01/2015      PT End of Session - 09/01/15 0959    Visit Number 6   Number of Visits 10   Date for PT Re-Evaluation 10/01/15   PT Start Time 0925   PT Stop Time 1011   PT Time Calculation (min) 46 min   Activity Tolerance Patient tolerated treatment well   Behavior During Therapy [facility] for tasks assessed/performed      Past Medical History  Diagnosis Date  . Hypertension   . Diverticulosis   . Colon polyps     Tubular adenoma and hyperplastic  . Collagenous colitis 2005  . GERD (gastroesophageal reflux disease)     hx hiatal hernia, hx esophagitis, hx stricture  . Hx: UTI (urinary tract infection)   . Heart murmur   . Dental infection 10/25/2014  . Lower extremity neuropathy 08/23/2013    On B12 therapy with numbness in the feet bilaterally no evidence of diabetes   . Carpal tunnel syndrome 09/04/2013  . Essential tremor   . Achilles tendon rupture   . Ocular migraine     jagged vision, a few per month  . Peripheral neuropathy     treated by Dr Posey Pronto (07/2015)    Past Surgical History  Procedure Laterality Date  . Appendectomy    . Tonsillectomy    . Cataract extraction    . Moles removed    . Excision morton's neuroma      Right foot  . Mouth surgery      (For Exostosis)  . Intraocular lens implant, secondary    . Carpal tunnel release    . Rotator cuff repair      There were no vitals filed for this visit.  Visit Diagnosis:  Abnormality of gait  Unsteadiness      Subjective Assessment - 09/01/15 0933    Subjective Doing exercises at home.    Currently in Pain? No/denies            [facility] PT Assessment - 09/01/15 0001    Balance   Balance Assessed Yes   High Level Balance   High Level Balance Comments Single leg stance: Lt 12 seconds, Rt 20 seconds                     [facility] Adult PT Treatment/Exercise - 09/01/15 0001    Knee/Hip Exercises: Aerobic   Nustep 8 seat, L2 10 min   Knee/Hip Exercises: Machines for Strengthening   Total Gym Leg Press 50# bil. 3x10  seat 6   Knee/Hip Exercises: Standing   Step Down Both;10 reps;Hand Hold: 2;2 sets   SLS Bil 10 sec 3x   Rebounder weight shifting 3 ways x 1 min. each   Walking with Sports Cord 20# 10x each direction  CGA for safety   Other Standing Knee Exercises toe taps 3x 20 sec                  PT Short Term Goals - 09/01/15 0935    PT SHORT TERM GOAL #2   Title perform single leg stance at the counter x 8 seconds without UE support and minimal instability    Status On-going  able  to stand 12-20 seconds but still has moderate instability           PT Long Term Goals - 08/12/15 1526    PT LONG TERM GOAL #1   Title be independent in advanced HEP   Status On-going   PT LONG TERM GOAL #2   Title improve Berg Balance score to > or = to 54/56 to reduce falls risk   Status On-going   PT LONG TERM GOAL #3   Title demonstrate gait on level surface without staggering or instability   Status On-going               Plan - 09/01/15 0941    Clinical Impression Statement Pt able to perform single leg stance on the Lt x 12 seconds and Rt for 20 seconds with moderate instability.  Pt with improved confidence with gait in the community and demonstrates increased stability with resisted walking in the clinic.  Pt will benefit from skilled PT for balance and strength training to improve safety and endurance in the community.    Pt will benefit from skilled therapeutic intervention in order to improve on the following deficits Abnormal gait;Decreased strength;Decreased balance   Rehab Potential Good   PT Frequency 2x / week   PT  Duration 8 weeks   PT Treatment/Interventions ADLs/Self Care Home Management;Functional mobility training;Stair training;Gait training;Neuromuscular re-education;Therapeutic exercise;Balance training;Therapeutic activities   PT Next Visit Plan Balance and gait training   Consulted and Agree with Plan of Care Patient        Problem List Patient Active Problem List   Diagnosis Date Noted  . Carpal tunnel syndrome 09/04/2013  . Lower extremity neuropathy 08/23/2013  . ACHILLES TENDON TEAR 01/17/2011  . Hyperlipemia 01/07/2010  . GASTROESOPHAGEAL REFLUX DISEASE 06/19/2009  . ACNE ROSACEA 04/03/2008  . TREMOR, ESSENTIAL 11/16/2007  . Essential hypertension 11/16/2007    TAKACS,KELLY, PT 09/01/2015, 10:12 AM  [facility] [address], [place], [place], [address] Phone: [phone]   Fax:  [phone]

## 2015-09-04 ENCOUNTER — Ambulatory Visit: Payer: Medicare Other | Admitting: Physical Therapy

## 2015-09-04 ENCOUNTER — Encounter: Payer: Self-pay | Admitting: Physical Therapy

## 2015-09-04 DIAGNOSIS — R2681 Unsteadiness on feet: Secondary | ICD-10-CM

## 2015-09-04 DIAGNOSIS — R269 Unspecified abnormalities of gait and mobility: Secondary | ICD-10-CM

## 2015-09-04 NOTE — Therapy (Signed)
[facility] [address], [place], [place], [address] Phone: [phone]   Fax:  [phone]  Physical Therapy Treatment  Patient Details  Name: Adriana Hendricks MRN: [id number] Date of Birth: 1935/02/17 Referring Provider:  Alda Berthold, DO  Encounter Date: 09/04/2015      PT End of Session - 09/04/15 1052    Visit Number 7   Number of Visits 10   Date for PT Re-Evaluation 10/01/15   PT Start Time 1012   PT Stop Time 1055   PT Time Calculation (min) 43 min   Activity Tolerance Patient tolerated treatment well   Behavior During Therapy [facility] for tasks assessed/performed      Past Medical History  Diagnosis Date  . Hypertension   . Diverticulosis   . Colon polyps     Tubular adenoma and hyperplastic  . Collagenous colitis 2005  . GERD (gastroesophageal reflux disease)     hx hiatal hernia, hx esophagitis, hx stricture  . Hx: UTI (urinary tract infection)   . Heart murmur   . Dental infection 10/25/2014  . Lower extremity neuropathy 08/23/2013    On B12 therapy with numbness in the feet bilaterally no evidence of diabetes   . Carpal tunnel syndrome 09/04/2013  . Essential tremor   . Achilles tendon rupture   . Ocular migraine     jagged vision, a few per month  . Peripheral neuropathy     treated by Dr Posey Pronto (07/2015)    Past Surgical History  Procedure Laterality Date  . Appendectomy    . Tonsillectomy    . Cataract extraction    . Moles removed    . Excision morton's neuroma      Right foot  . Mouth surgery      (For Exostosis)  . Intraocular lens implant, secondary    . Carpal tunnel release    . Rotator cuff repair      There were no vitals filed for this visit.  Visit Diagnosis:  Unsteadiness  Abnormality of gait      Subjective Assessment - 09/04/15 1014    Subjective Continues to report she is doing better and better.    Currently in Pain? No/denies   Multiple Pain Sites No                          [facility] Adult PT Treatment/Exercise - 09/04/15 0001    High Level Balance   High Level Balance Activities Head turns   Knee/Hip Exercises: Aerobic   Nustep Seat 8, L# x 10 min   Knee/Hip Exercises: Machines for Strengthening   Total Gym Leg Press 55# bil 3x10 , seat #6   Knee/Hip Exercises: Standing   Hip ADduction Strengthening;Both  30x   Step Down Both;10 reps;Hand Hold: 2;2 sets   SLS Bil  3x as long as she can.    Rebounder weight shifting 3 ways x 1 min. each   Walking with Sports Cord 20# 10x each direction  CGA for safety   Other Standing Knee Exercises toe taps 3x 20 sec                  PT Short Term Goals - 09/01/15 0935    PT SHORT TERM GOAL #2   Title perform single leg stance at the counter x 8 seconds without UE support and minimal instability    Status On-going  able to stand 12-20 seconds  but still has moderate instability           PT Long Term Goals - 08/12/15 1526    PT LONG TERM GOAL #1   Title be independent in advanced HEP   Status On-going   PT LONG TERM GOAL #2   Title improve Berg Balance score to > or = to 54/56 to reduce falls risk   Status On-going   PT LONG TERM GOAL #3   Title demonstrate gait on level surface without staggering or instability   Status On-going               Plan - 09/04/15 1053    Clinical Impression Statement Continued to do well in therapy, step downs probably most challenging today. Increased weight on leg press which pt tolerated very well.    Pt will benefit from skilled therapeutic intervention in order to improve on the following deficits Abnormal gait;Decreased strength;Decreased balance   Rehab Potential Good   PT Frequency 2x / week   PT Duration 8 weeks   PT Treatment/Interventions ADLs/Self Care Home Management;Functional mobility training;Stair training;Gait training;Neuromuscular re-education;Therapeutic exercise;Balance training;Therapeutic  activities   PT Next Visit Plan Balance and gait training   Consulted and Agree with Plan of Care Patient        Problem List Patient Active Problem List   Diagnosis Date Noted  . Carpal tunnel syndrome 09/04/2013  . Lower extremity neuropathy 08/23/2013  . ACHILLES TENDON TEAR 01/17/2011  . Hyperlipemia 01/07/2010  . GASTROESOPHAGEAL REFLUX DISEASE 06/19/2009  . ACNE ROSACEA 04/03/2008  . TREMOR, ESSENTIAL 11/16/2007  . Essential hypertension 11/16/2007    Quint Chestnut, PTA 09/04/2015, 10:55 AM  [facility] [address], [place], [place], [address] Phone: [phone]   Fax:  [phone]

## 2015-09-07 ENCOUNTER — Ambulatory Visit: Payer: Medicare Other | Admitting: Physical Therapy

## 2015-09-07 ENCOUNTER — Encounter: Payer: Self-pay | Admitting: Physical Therapy

## 2015-09-07 DIAGNOSIS — R269 Unspecified abnormalities of gait and mobility: Secondary | ICD-10-CM | POA: Diagnosis not present

## 2015-09-07 DIAGNOSIS — R2681 Unsteadiness on feet: Secondary | ICD-10-CM | POA: Diagnosis not present

## 2015-09-07 NOTE — Therapy (Signed)
[facility] [address] [place], [place], [address] Phone: [phone]   Fax:  [phone]  Physical Therapy Treatment  Patient Details  Name: Adriana Hendricks MRN: [id number] Date of Birth: 11-30-1935 Referring Provider:  Alda Berthold, DO  Encounter Date: 09/07/2015      PT End of Session - 09/07/15 1037    Visit Number 8   Number of Visits 10   Date for PT Re-Evaluation 10/01/15   PT Start Time 9470   PT Stop Time 1055   PT Time Calculation (min) 41 min   Activity Tolerance Patient tolerated treatment well   Behavior During Therapy [facility] for tasks assessed/performed      Past Medical History  Diagnosis Date  . Hypertension   . Diverticulosis   . Colon polyps     Tubular adenoma and hyperplastic  . Collagenous colitis 2005  . GERD (gastroesophageal reflux disease)     hx hiatal hernia, hx esophagitis, hx stricture  . Hx: UTI (urinary tract infection)   . Heart murmur   . Dental infection 10/25/2014  . Lower extremity neuropathy 08/23/2013    On B12 therapy with numbness in the feet bilaterally no evidence of diabetes   . Carpal tunnel syndrome 09/04/2013  . Essential tremor   . Achilles tendon rupture   . Ocular migraine     jagged vision, a few per month  . Peripheral neuropathy     treated by Dr Posey Pronto (07/2015)    Past Surgical History  Procedure Laterality Date  . Appendectomy    . Tonsillectomy    . Cataract extraction    . Moles removed    . Excision morton's neuroma      Right foot  . Mouth surgery      (For Exostosis)  . Intraocular lens implant, secondary    . Carpal tunnel release    . Rotator cuff repair      There were no vitals filed for this visit.  Visit Diagnosis:  Unsteadiness  Abnormality of gait      Subjective Assessment - 09/07/15 1015    Subjective Went down flight of stairs this weekned and felt good doing them, confidence is way high!    Currently in Pain?  No/denies                         [facility] Adult PT Treatment/Exercise - 09/07/15 0001    High Level Balance   High Level Balance Activities Head turns   Knee/Hip Exercises: Aerobic   Nustep Seat 8 L3 x 10 min   Knee/Hip Exercises: Machines for Strengthening   Total Gym Leg Press 55# bil 10x3  Pt tried 60# but htis felt too straining at her knees.   Knee/Hip Exercises: Standing   Hip ADduction Strengthening;Both;3 sets;10 reps   Hip ADduction Limitations 1#   SLS Bil  3x as long as she can.    Rebounder weight shifting 3 ways x 1 min. each   Other Standing Knee Exercises toe taps 3x 20 sec                  PT Short Term Goals - 09/07/15 1019    PT SHORT TERM GOAL #1   Title be independent in initial HEP   Time 4   Period Weeks   Status Achieved   PT SHORT TERM GOAL #2   Title perform single leg stance at the counter  x 8 seconds without UE support and minimal instability    Time 4   Period Weeks   Status Partially Met  Pt can do 8 seconds but still not stable           PT Long Term Goals - 09/07/15 1020    PT LONG TERM GOAL #1   Title be independent in advanced HEP   Time 8   Period Weeks   Status On-going   PT LONG TERM GOAL #2   Title improve Berg Balance score to > or = to 54/56 to reduce falls risk   Time 8   Period Weeks   Status On-going  Will do on 10th visit.   PT LONG TERM GOAL #3   Title demonstrate gait on level surface without staggering or instability   Time 8   Period Weeks   Status Achieved  Pt can demonstrate this today               Plan - 09/07/15 1049    Clinical Impression Statement Pt reports stairs do not scare her anymore. She tried more wt on leg press which increased a "strain" in her knees that she did not like. We were able to increase wt on resisted walking that challenged her balance. She did well with this.    Pt will benefit from skilled therapeutic intervention in order to improve on the  following deficits Abnormal gait;Decreased strength;Decreased balance   Rehab Potential Good   PT Duration 8 weeks   PT Treatment/Interventions ADLs/Self Care Home Management;Functional mobility training;Stair training;Gait training;Neuromuscular re-education;Therapeutic exercise;Balance training;Therapeutic activities   PT Next Visit Plan BERG next visit, pt may want it to be her last visit.    Consulted and Agree with Plan of Care Patient        Problem List Patient Active Problem List   Diagnosis Date Noted  . Carpal tunnel syndrome 09/04/2013  . Lower extremity neuropathy 08/23/2013  . ACHILLES TENDON TEAR 01/17/2011  . Hyperlipemia 01/07/2010  . GASTROESOPHAGEAL REFLUX DISEASE 06/19/2009  . ACNE ROSACEA 04/03/2008  . TREMOR, ESSENTIAL 11/16/2007  . Essential hypertension 11/16/2007    Coren Crownover, PTA 09/07/2015, 10:54 AM  [facility] [address], [place], [place], [address] Phone: [phone]   Fax:  [phone]

## 2015-09-10 ENCOUNTER — Ambulatory Visit: Payer: Medicare Other

## 2015-09-10 DIAGNOSIS — R269 Unspecified abnormalities of gait and mobility: Secondary | ICD-10-CM

## 2015-09-10 DIAGNOSIS — R2681 Unsteadiness on feet: Secondary | ICD-10-CM | POA: Diagnosis not present

## 2015-09-10 NOTE — Therapy (Signed)
[facility] [address], [place], [place], [address] Phone: [phone]   Fax:  [phone]  Physical Therapy Treatment  Patient Details  Name: Adriana Hendricks MRN: [id number] Date of Birth: [birth year] Referring Provider:  Alda Berthold, DO  Encounter Date: 09/10/2015      PT End of Session - 09/10/15 1126    Visit Number 9   PT Start Time 1050   PT Stop Time 4695   PT Time Calculation (min) 41 min   Activity Tolerance Patient tolerated treatment well   Behavior During Therapy [facility] for tasks assessed/performed      Past Medical History  Diagnosis Date  . Hypertension   . Diverticulosis   . Colon polyps     Tubular adenoma and hyperplastic  . Collagenous colitis 2005  . GERD (gastroesophageal reflux disease)     hx hiatal hernia, hx esophagitis, hx stricture  . Hx: UTI (urinary tract infection)   . Heart murmur   . Dental infection 10/25/2014  . Lower extremity neuropathy 08/23/2013    On B12 therapy with numbness in the feet bilaterally no evidence of diabetes   . Carpal tunnel syndrome 09/04/2013  . Essential tremor   . Achilles tendon rupture   . Ocular migraine     jagged vision, a few per month  . Peripheral neuropathy     treated by Dr Posey Pronto (07/2015)    Past Surgical History  Procedure Laterality Date  . Appendectomy    . Tonsillectomy    . Cataract extraction    . Moles removed    . Excision morton's neuroma      Right foot  . Mouth surgery      (For Exostosis)  . Intraocular lens implant, secondary    . Carpal tunnel release    . Rotator cuff repair      There were no vitals filed for this visit.  Visit Diagnosis:  Unsteadiness  Abnormality of gait      Subjective Assessment - 09/10/15 1052    Subjective Confidnece continues to improve with gait and balance.     Currently in Pain? No/denies            [facility] PT Assessment - 09/10/15 0001    Assessment   Medical  Diagnosis unsteady gait (R26.81), heriditary and idiopathic peripheral neuropathy (G60.9)   Onset Date/Surgical Date 08/05/13   Standardized Balance Assessment   Standardized Balance Assessment Berg Balance Test   Berg Balance Test   Sit to Stand Able to stand without using hands and stabilize independently   Standing Unsupported Able to stand safely 2 minutes   Sitting with Back Unsupported but Feet Supported on Floor or Stool Able to sit safely and securely 2 minutes   Stand to Sit Sits safely with minimal use of hands   Transfers Able to transfer safely, minor use of hands   Standing Unsupported with Eyes Closed Able to stand 10 seconds safely   Standing Ubsupported with Feet Together Able to place feet together independently and stand 1 minute safely   From Standing, Reach Forward with Outstretched Arm Can reach confidently >25 cm (10")   From Standing Position, Pick up Object from Floor Able to pick up shoe safely and easily   From Standing Position, Turn to Look Behind Over each Shoulder Looks behind from both sides and weight shifts well   Turn 360 Degrees Able to turn 360 degrees safely in 4 seconds or less  Standing Unsupported, Alternately Place Feet on Step/Stool Able to stand independently and safely and complete 8 steps in 20 seconds   Standing Unsupported, One Foot in Front Able to place foot tandem independently and hold 30 seconds   Standing on One Leg Able to lift leg independently and hold 5-10 seconds   Total Score 55                     [facility] Adult PT Treatment/Exercise - 09/23/2015 0001    Knee/Hip Exercises: Aerobic   Nustep Seat 8 L3 x 10 min   Knee/Hip Exercises: Machines for Strengthening   Total Gym Leg Press 55# bil 10x3  seat 6   Knee/Hip Exercises: Standing   Step Down Both;10 reps;Hand Hold: 2;2 sets   Rebounder weight shifting 3 ways x 1 min. each   Walking with Sports Cord 20# 10x each direction  CGA for safety                   PT Short Term Goals - 09/07/15 1019    PT SHORT TERM GOAL #1   Title be independent in initial HEP   Time 4   Period Weeks   Status Achieved   PT SHORT TERM GOAL #2   Title perform single leg stance at the counter x 8 seconds without UE support and minimal instability    Time 4   Period Weeks   Status Partially Met  Pt can do 8 seconds but still not stable           PT Long Term Goals - 09-23-15 1103    PT LONG TERM GOAL #1   Title be independent in advanced HEP   Status Achieved   PT LONG TERM GOAL #2   Title improve Berg Balance score to > or = to 54/56 to reduce falls risk   Status Achieved   PT LONG TERM GOAL #3   Title demonstrate gait on level surface without staggering or instability   Status Achieved               Plan - 2015-09-23 1057    Clinical Impression Statement Pt reports increased confidence especially with steps.  Pt is able to tolerate increased challenge with clinic activities. Berg Balance test 55/56 today. Pt reports 60% improved balance since the start of care and will continue with HEP for further gains after D/C.     PT Next Visit Plan D/C PT to HEP   Consulted and Agree with Plan of Care Patient          G-Codes - 2015/09/23 1111    Functional Assessment Tool Used Merrilee Jansky Balance Test: 55/56   Functional Limitation Mobility: Walking and moving around   Mobility: Walking and Moving Around Goal Status [phone]) At least 1 percent but less than 20 percent impaired, limited or restricted   Mobility: Walking and Moving Around Discharge Status [phone]) At least 1 percent but less than 20 percent impaired, limited or restricted      Problem List Patient Active Problem List   Diagnosis Date Noted  . Carpal tunnel syndrome 09/04/2013  . Lower extremity neuropathy 08/23/2013  . ACHILLES TENDON TEAR 01/17/2011  . Hyperlipemia 01/07/2010  . GASTROESOPHAGEAL REFLUX DISEASE 06/19/2009  . ACNE ROSACEA 04/03/2008  . TREMOR, ESSENTIAL 11/16/2007  .  Essential hypertension 11/16/2007  PHYSICAL THERAPY DISCHARGE SUMMARY  Visits from Start of Care: 9  Current functional level related to goals / functional outcomes: Pt  has mild balance deficits and reports 60% overall improvement in balance and confidence with balance since the start of care.     Remaining deficits: See above.  Pt has HEP in place to address remaining deficits.     Education / Equipment: HEP Plan: Patient agrees to discharge.  Patient goals were met. Patient is being discharged due to meeting the stated rehab goals.  ?????      Siddh Vandeventer, PT 09/10/2015, 11:35 AM  [facility] [address], [place], [place], [address] Phone: [phone]   Fax:  [phone]

## 2015-09-21 DIAGNOSIS — Z853 Personal history of malignant neoplasm of breast: Secondary | ICD-10-CM | POA: Diagnosis not present

## 2015-09-21 DIAGNOSIS — M8580 Other specified disorders of bone density and structure, unspecified site: Secondary | ICD-10-CM | POA: Diagnosis not present

## 2015-09-21 LAB — HM DEXA SCAN

## 2015-11-10 ENCOUNTER — Ambulatory Visit (INDEPENDENT_AMBULATORY_CARE_PROVIDER_SITE_OTHER): Payer: Medicare Other

## 2015-11-10 DIAGNOSIS — Z23 Encounter for immunization: Secondary | ICD-10-CM | POA: Diagnosis not present

## 2015-12-24 DIAGNOSIS — Z0100 Encounter for examination of eyes and vision without abnormal findings: Secondary | ICD-10-CM | POA: Diagnosis not present

## 2015-12-24 DIAGNOSIS — H5310 Unspecified subjective visual disturbances: Secondary | ICD-10-CM | POA: Diagnosis not present

## 2015-12-24 DIAGNOSIS — H353131 Nonexudative age-related macular degeneration, bilateral, early dry stage: Secondary | ICD-10-CM | POA: Diagnosis not present

## 2015-12-24 DIAGNOSIS — H43813 Vitreous degeneration, bilateral: Secondary | ICD-10-CM | POA: Diagnosis not present

## 2016-01-20 DIAGNOSIS — M25531 Pain in right wrist: Secondary | ICD-10-CM | POA: Diagnosis not present

## 2016-02-29 DIAGNOSIS — M1811 Unilateral primary osteoarthritis of first carpometacarpal joint, right hand: Secondary | ICD-10-CM | POA: Diagnosis not present

## 2016-04-26 ENCOUNTER — Other Ambulatory Visit: Payer: Self-pay | Admitting: Family Medicine

## 2016-07-11 DIAGNOSIS — M272 Inflammatory conditions of jaws: Secondary | ICD-10-CM | POA: Diagnosis not present

## 2016-07-25 ENCOUNTER — Other Ambulatory Visit: Payer: Self-pay | Admitting: Family Medicine

## 2016-07-28 ENCOUNTER — Telehealth: Payer: Self-pay | Admitting: Internal Medicine

## 2016-07-28 NOTE — Telephone Encounter (Signed)
Dr. Fuller Plan are you willing to see her? Former [facility] patient

## 2016-07-29 NOTE — Telephone Encounter (Signed)
OK 

## 2016-08-02 DIAGNOSIS — M272 Inflammatory conditions of jaws: Secondary | ICD-10-CM | POA: Diagnosis not present

## 2016-08-08 ENCOUNTER — Telehealth: Payer: Self-pay | Admitting: Neurology

## 2016-08-08 NOTE — Telephone Encounter (Signed)
I called the patient back and she said that she had oral surgery a week ago and the surgeon cut a nerve.  Her mouth is now numb all the time.  Instructed her to call the surgeon but she said that he probably would not do anything.  Is there anything that we can do for her?

## 2016-08-08 NOTE — Telephone Encounter (Signed)
Agree with talking to her surgeon.  I do not have anything to offer to fix the problem, if in fact, the nerve was injured.

## 2016-08-08 NOTE — Telephone Encounter (Signed)
Patient would like to talk to someone about the pain she is in please call [phone]

## 2016-08-10 ENCOUNTER — Ambulatory Visit (INDEPENDENT_AMBULATORY_CARE_PROVIDER_SITE_OTHER): Payer: Medicare Other | Admitting: Neurology

## 2016-08-10 ENCOUNTER — Encounter: Payer: Self-pay | Admitting: Neurology

## 2016-08-10 VITALS — BP 110/70 | HR 56 | Ht 66.0 in | Wt 155.0 lb

## 2016-08-10 DIAGNOSIS — R208 Other disturbances of skin sensation: Secondary | ICD-10-CM | POA: Diagnosis not present

## 2016-08-10 DIAGNOSIS — G609 Hereditary and idiopathic neuropathy, unspecified: Secondary | ICD-10-CM

## 2016-08-10 DIAGNOSIS — R200 Anesthesia of skin: Secondary | ICD-10-CM

## 2016-08-10 NOTE — Progress Notes (Signed)
Adriana Hendricks  Follow-up Visit   Date: 08/10/16   Adriana Hendricks MRN: [id number] DOB: 1935-04-09   Interim History: Adriana Hendricks is a 81 y.o. year-old right-handed Caucasian female with history of GERD, hypertension, and left CTS s/p release (2014) returning to the clinic for new complaints of facial numbness.  The patient was accompanied to the clinic by her husband.    History of present illness: Since February 2013, she had experienced left shoulder pain and reduced range of motion and was later found to have a partial left rotator cuff tear. She was doing physical therapy and in March 2014, during which time she developed acute onset of numbness involving the medial thumb and lateral index finger. Symptoms are worse when she wakes up.  There is associated left hand weakness, such as when opening jars. She also has has chronic shoulder pain, which limits her strength so is unsure if that is contributing to her weakness.  NCS/EMG from Sept 2014 showed mild left CTS. She underwent left rotator cuff repair and left carpal tunnel release on 9/16.  She has noticed 80% improvement of numbness/tingling of her thumb and index finger. There is also history of bilateral feet numbness which has been present for a number of years and gait unsteadiness.  She reports having ocular migraines for several years which would occur once per month, described as jagged appearance with loss of central vision involving both eyes, lasting 45 minutes.  During this time, she cannot drive or read because it interferes with her vision.  Over the summer of 2016, the frequency has increased to every 2-3 days, lasting 45 minutes. She does not have associated headaches.  She did not tolerate topiramate and developed worsening pain around her left eye, so stopped it.  She started using polarized sunglasses which has completely resolved her ocular migraines.    UPDATE 08/10/2016:  Patient called to make  urgent appointment because of numbness involving the face following oral surgery. She underwent gum surgery due to excess bone growth on 08/02/2016, but she became very concerned because of numbness of the jaw, lower lip, and chin, worse on the left which did not resolve within 1-2 days.  She has noticed changes in taste and numbness of the tongue, but this is slowly improving. Over the past few days, she has experienced hypersensitivity of the chin region and intermittent shooting pain.  She has not ate solid food because she cannot sense her inner cheek.  There is no weakness.  Medications:  Current Outpatient Prescriptions on File Prior to Visit  Medication Sig Dispense Refill  . ascorbic acid (VITAMIN C) 500 MG tablet Take 500 mg by mouth daily.    Marland Kitchen b complex vitamins tablet Take 1 tablet by mouth daily.    . beta carotene w/minerals (OCUVITE) tablet Take 1 tablet by mouth daily.    . Cholecalciferol (VITAMIN D3) 400 UNITS CAPS Take 1 capsule by mouth daily.    . nadolol-bendroflumethiazide (CORZIDE) 40-5 MG tablet TAKE 1 TABLET DAILY 90 tablet 0  . NON FORMULARY Tumeric    . omeprazole (PRILOSEC) 20 MG capsule Take 1 capsule (20 mg total) by mouth daily. 30 capsule 3  . ranitidine (ZANTAC) 150 MG capsule Take 150 mg by mouth every evening.    . saccharomyces boulardii (FLORASTOR) 250 MG capsule Take by mouth.    . zinc sulfate 220 MG capsule Take 220 mg by mouth daily.     No current facility-administered medications  on file prior to visit.     Allergies:  Allergies  Allergen Reactions  . Clarithromycin     REACTION: nausea  . Erythromycin Base Nausea And Vomiting  . Levofloxacin     Patient has a history of a Achilles tendon tear and developed tendon pain while on medication  . Metronidazole     REACTION: Rash  . Penicillins     Rash      Review of Systems:  CONSTITUTIONAL: No fevers, chills, night sweats, or weight loss.   EYES: No visual changes or eye pain ENT: No  hearing changes.  No history of nose bleeds.  +numbness of the face RESPIRATORY: No cough, wheezing and shortness of breath.   CARDIOVASCULAR: Negative for chest pain, and palpitations.   GI: Negative for abdominal discomfort, blood in stools or black stools.  No recent change in bowel habits.   GU:  No history of incontinence.   MUSCLOSKELETAL: +history of joint pain or swelling.  No myalgias.   SKIN: Negative for lesions, rash, and itching.   HEMATOLOGY/ONCOLOGY: Negative for prolonged bleeding, bruising easily, and swollen nodes.  ENDOCRINE: Negative for cold or heat intolerance, polydipsia or goiter.   PSYCH:  No depression or anxiety symptoms.   NEURO: As Above.   Vital Signs:  BP 110/70   Pulse (!) 56   Ht '5\' 6"'  (1.676 m)   Wt 155 lb (70.3 kg)   SpO2 96%   BMI 25.02 kg/m   Neurological Exam: MENTAL STATUS including orientation to time, place, person, recent and remote memory, attention span and concentration, language, and fund of knowledge is normal.  Speech is not dysarthric.  CRANIAL NERVES:  Visual fields are intact. Pupils round and reactive to light bilaterally.  Extraocular muscle are intact.  No ptosis.  Face is appears mildly asymmetric due to recent oral surgery/sutures.  Tongue is midline.  Sensation over the inner cheek and tongue is intact.  There is mild hyperesthesias over the chin and lower lip to temperature and pin prick bilaterally.   MOTOR: Motor strength is 5/5 throughout, including distal hand and feet muscles. Normal tone.  Very mild and low amplitude tremor of the right hand.   MSRs:   Right      Left  brachioradialis  3+   brachioradialis  3+  biceps  3+   biceps  3+  triceps  3+   triceps  3+  patellar  3+   patellar  3+   ankle jerk  trace   ankle jerk  trace    SENSORY:  Vibration reduced at the great toes.  COORDINATION/GAIT: Normal finger-to- nose-finger.  Gait narrow based and stable.   Data: Labs 09/02/2013:  Ceruloplasmin 43, copper 171,  fT4 0.98, fT3 2.6, SPEP/UPEP with IFE, 2hr GTT 100*/121/123 Labs 07/05/2013:  Vitamin B12 1225, HbA1c 6.0, TSH 1.69 Labs 06/12/2015:  ESR 11, TSH 2.24  EMG left arm 09/04/2013:  Left median neuropathy at or distal to the wrist (consistent with carpal tunnel syndrome), mild in degree electrically, based the prolongation of the median sensory distal latencies.   IMPRESSION/PLAN:  1.  Facial numbness involving the lower lip and chin following oral surgery which is most likely due to localized mental nerve irritation and I suspect this will improve with time. I presume this was due to local anesthesia possibly causing mild injury to the nerve, but for more definitive answers, I requested that she follow-up with her oral surgeon for evaluation who knows what nerve blocks/anesthesias  was administered.  I offered neuralagesic medication such as gabapentin, but her pain is not severe.    2.  Ocular migraines, resolved  3.  Idiopathic peripheral neuropathy, stable  - Neuropathy labs are normal, except mildly abnormal glucose tolerance test  - Consider EMG of the legs if pain worsens  4.  Hyperreflexia likely due to cervical spondylosis with possible canal stenosis  - Since she remains asymptomatic, will follow  - If she develops new neck pain or weakness, low threshold to obtain cervical MRI  5.  Benign essential tremor, left.  Stable.  Return to clinic as needed   The duration of this appointment visit was 25 minutes of face-to-face time with the patient.  Greater than 50% of this time was spent in counseling, explanation of diagnosis, planning of further management, and coordination of care.   Thank you for allowing me to participate in patient's care.  If I can answer any additional questions, I would be pleased to do so.    Sincerely,    Donika K. Posey Pronto, DO

## 2016-08-10 NOTE — Patient Instructions (Signed)
If you have worsening shooting or tingling discomfort, please call my office and we can start you on a medication specific for nerve pain.

## 2016-08-17 DIAGNOSIS — Z872 Personal history of diseases of the skin and subcutaneous tissue: Secondary | ICD-10-CM | POA: Diagnosis not present

## 2016-08-17 DIAGNOSIS — D485 Neoplasm of uncertain behavior of skin: Secondary | ICD-10-CM | POA: Diagnosis not present

## 2016-08-17 DIAGNOSIS — D225 Melanocytic nevi of trunk: Secondary | ICD-10-CM | POA: Diagnosis not present

## 2016-08-17 DIAGNOSIS — L570 Actinic keratosis: Secondary | ICD-10-CM | POA: Diagnosis not present

## 2016-08-17 DIAGNOSIS — L821 Other seborrheic keratosis: Secondary | ICD-10-CM | POA: Diagnosis not present

## 2016-08-17 DIAGNOSIS — L814 Other melanin hyperpigmentation: Secondary | ICD-10-CM | POA: Diagnosis not present

## 2016-08-31 DIAGNOSIS — Z5189 Encounter for other specified aftercare: Secondary | ICD-10-CM | POA: Diagnosis not present

## 2016-08-31 DIAGNOSIS — B999 Unspecified infectious disease: Secondary | ICD-10-CM | POA: Diagnosis not present

## 2016-09-12 ENCOUNTER — Ambulatory Visit (INDEPENDENT_AMBULATORY_CARE_PROVIDER_SITE_OTHER): Payer: Medicare Other | Admitting: Family Medicine

## 2016-09-12 ENCOUNTER — Encounter: Payer: Self-pay | Admitting: Family Medicine

## 2016-09-12 VITALS — BP 148/86 | HR 64 | Temp 97.8°F | Ht 65.0 in | Wt 157.6 lb

## 2016-09-12 DIAGNOSIS — R1310 Dysphagia, unspecified: Secondary | ICD-10-CM | POA: Diagnosis not present

## 2016-09-12 DIAGNOSIS — I10 Essential (primary) hypertension: Secondary | ICD-10-CM

## 2016-09-12 DIAGNOSIS — R0683 Snoring: Secondary | ICD-10-CM

## 2016-09-12 DIAGNOSIS — K219 Gastro-esophageal reflux disease without esophagitis: Secondary | ICD-10-CM

## 2016-09-12 DIAGNOSIS — Z87891 Personal history of nicotine dependence: Secondary | ICD-10-CM

## 2016-09-12 DIAGNOSIS — M79672 Pain in left foot: Secondary | ICD-10-CM

## 2016-09-12 DIAGNOSIS — Z860101 Personal history of adenomatous and serrated colon polyps: Secondary | ICD-10-CM

## 2016-09-12 DIAGNOSIS — G4719 Other hypersomnia: Secondary | ICD-10-CM

## 2016-09-12 DIAGNOSIS — K589 Irritable bowel syndrome without diarrhea: Secondary | ICD-10-CM

## 2016-09-12 DIAGNOSIS — Z86010 Personal history of colonic polyps: Secondary | ICD-10-CM | POA: Insufficient documentation

## 2016-09-12 MED ORDER — HYDROCORTISONE 2.5 % RE CREA: 1.0000 "application " | TOPICAL_CREAM | Freq: Two times a day (BID) | RECTAL | 0 refills | Status: DC

## 2016-09-12 MED ORDER — NADOLOL-BENDROFLUMETHIAZIDE 40-5 MG PO TABS: 1.0000 | ORAL_TABLET | Freq: Every day | ORAL | 3 refills | Status: DC

## 2016-09-12 NOTE — Progress Notes (Signed)
Pre visit review using our clinic review tool, if applicable. No additional management support is needed unless otherwise documented below in the visit note. 

## 2016-09-12 NOTE — Progress Notes (Signed)
Phone: [phone]  Subjective:  Patient presents today to establish care with me as their new primary care provider. Patient was formerly a patient of Dr. Maudie Mercury. Chief complaint-noted.   See problem oriented charting- ROS- rectal pain, vision changes- known ocular migraines, numbness on chin after recent oral surgery. No chest pain.   The following were reviewed and entered/updated in epic: Past Medical History:  Diagnosis Date  . Achilles tendon rupture   . Carpal tunnel syndrome 09/04/2013  . Collagenous colitis 2005  . Colon polyps    Tubular adenoma and hyperplastic  . Dental infection 10/25/2014  . Diverticulosis   . Essential tremor   . GERD (gastroesophageal reflux disease)    hx hiatal hernia, hx esophagitis, hx stricture  . Heart murmur   . Hx: UTI (urinary tract infection)   . Hypertension   . Lower extremity neuropathy 08/23/2013   On B12 therapy with numbness in the feet bilaterally no evidence of diabetes   . Ocular migraine    jagged vision, a few per month  . Peripheral neuropathy ([place])    treated by Dr Posey Pronto (07/2015)   Patient Active Problem List   Diagnosis Date Noted  . Lower extremity neuropathy 08/23/2013    Priority: Medium  . Hyperlipemia 01/07/2010    Priority: Medium  . GASTROESOPHAGEAL REFLUX DISEASE 06/19/2009    Priority: Medium  . Essential tremor 11/16/2007    Priority: Medium  . Essential hypertension 11/16/2007    Priority: Medium  . IBS (irritable bowel syndrome) 09/12/2016    Priority: Low  . History of adenomatous polyp of colon 09/12/2016    Priority: Low  . Former smoker 09/12/2016    Priority: Low  . Carpal tunnel syndrome 09/04/2013    Priority: Low  . Achilles tendon tear 01/17/2011    Priority: Low  . ACNE ROSACEA 04/03/2008    Priority: Low   Past Surgical History:  Procedure Laterality Date  . APPENDECTOMY    . CARPAL TUNNEL RELEASE    . CATARACT EXTRACTION    . EXCISION MORTON'S NEUROMA     Right foot  .  INTRAOCULAR LENS IMPLANT, SECONDARY    . Moles removed    . MOUTH SURGERY     (For Exostosis)  . ROTATOR CUFF REPAIR    . TONSILLECTOMY      Family History  Problem Relation Age of Onset  . Rectal cancer Father     colostomy  . Heart failure Father     Died, 89  . Other Mother     Died, 43. old age.   . Barrett's esophagus Son   . Colon cancer Neg Hx   . Esophageal cancer Neg Hx   . Stomach cancer Neg Hx     Medications- reviewed and updated Current Outpatient Prescriptions  Medication Sig Dispense Refill  . Multiple Vitamins-Minerals (CENTRUM SILVER 50+WOMEN) TABS Take 1 tablet by mouth.    . nadolol-bendroflumethiazide (CORZIDE) 40-5 MG tablet Take 1 tablet by mouth daily. 90 tablet 3  . hydrocortisone (ANUSOL-HC) 2.5 % rectal cream Place 1 application rectally 2 (two) times daily. Up to 2 weeks for hemorrhoids 30 g 0   No current facility-administered medications for this visit.     Allergies-reviewed and updated Allergies  Allergen Reactions  . Clarithromycin     REACTION: nausea  . Erythromycin Base Nausea And Vomiting  . Levofloxacin     Patient has a history of a Achilles tendon tear and developed tendon pain while on medication  .  Metronidazole     REACTION: Rash  . Penicillins     Rash     Social History   Social History  . Marital status: Married    Spouse name: N/A  . Number of children: 2  . Years of education: N/A   Occupational History  . Retired    Social History Main Topics  . Smoking status: Former Research scientist (life sciences)  . Smokeless tobacco: Never Used     Comment: Quit in 1969  . Alcohol use 0.6 oz/week    1 Glasses of wine per week     Comment: 1 glass of wine per day  . Drug use: No  . Sexual activity: Yes   Other Topics Concern  . None   Social History Narrative   Married. Husband patient of Dr. Yong Channel. Married 1958. 2 children. 4 grandkids (3 girls, 1 boy).       Retired from [place] [employer].        Hobbies: genealogy and DNA                ROS--See HPI   Objective: BP (!) 148/86 (BP Location: Left Arm, Patient Position: Sitting, Cuff Size: Normal)   Pulse 64   Temp 97.8 F (36.6 C) (Oral)   Ht 5\' 5"  (1.651 m)   Wt 157 lb 9.6 oz (71.5 kg)   SpO2 96%   BMI 26.23 kg/m  Gen: NAD, resting comfortably HEENT: Mucous membranes are moist. Oropharynx normal Neck: no thyromegaly CV: RRR no murmurs rubs or gallops Lungs: CTAB no crackles, wheeze, rhonchi Abdomen: soft/nontender/nondistended/normal bowel sounds. No rebound or guarding.  Rectal: hemorrhoid noted about 10-11 o clock Ext: no edema Skin: warm, dry, no rash Neuro: grossly normal, moves all extremities, PERRLA, moves well onto table without assist  Assessment/Plan:  GI issues Dysphagia/ history of stricture/GERD IBS (irritable bowel syndrome) History of adenomatous polyp of colon S: history of dysphagia and esophageal stricture requiring dilation intermittently, she has had gradually progressive symptoms over last 2 years and states at times food just feels like it gets stuck. She also has had colon polyps as recently as 2015- stated no recall per Olevia Perches- will see new GI and get their opinion (father died of rectal cancer and she wants to consider one more). She also gets rectal pain intermittently. Intermittent bouts of diarrhea and nausea (states  That's how she was told she had IBS) but sometimes even throws up with these episodes A/P: Refer to GI- may need EGD and wants their opinion on one more colonoscopy. Can mention rectal pain to them but appears to just be external hemorrhoid- trial hydrortisone  Left heel pain S: months, avoids walking or grocery store. History of achilles tear A/P: refer to [facility] where she was seen previously  Daytime sleepiness and snoring S: long term issues A/P: wants sleep apnea testing- refer to pulmonology  Essential hypertension S: controlled on corzide when taking  medication- did not take dose yet today BP Readings from Last 3 Encounters:  09/12/16 (!) 148/86  08/10/16 110/70  08/05/15 128/80  A/P:Continue current medications, take meds when she gets home  Return in about 6 months (around 03/12/2017).  Orders Placed This Encounter  Procedures  . Ambulatory referral to Gastroenterology    Referral Priority:   Routine    Referral Type:   Consultation    Referral Reason:   Specialty Services Required    Number of Visits Requested:   1  . Ambulatory  referral to Orthopedic Surgery    Referral Priority:   Routine    Referral Type:   Surgical    Referral Reason:   Specialty Services Required    Requested Specialty:   Orthopedic Surgery    Number of Visits Requested:   1  . Ambulatory referral to Pulmonology    Referral Priority:   Routine    Referral Type:   Consultation    Referral Reason:   Specialty Services Required    Requested Specialty:   Pulmonary Disease    Number of Visits Requested:   1    Meds ordered this encounter  Medications  . Multiple Vitamins-Minerals (CENTRUM SILVER 50+WOMEN) TABS    Sig: Take 1 tablet by mouth.  . hydrocortisone (ANUSOL-HC) 2.5 % rectal cream    Sig: Place 1 application rectally 2 (two) times daily. Up to 2 weeks for hemorrhoids    Dispense:  30 g    Refill:  0  . nadolol-bendroflumethiazide (CORZIDE) 40-5 MG tablet    Sig: Take 1 tablet by mouth daily.    Dispense:  90 tablet    Refill:  3    Return precautions advised.   Garret Reddish, MD

## 2016-09-12 NOTE — Assessment & Plan Note (Signed)
S: controlled on corzide when taking medication- did not take dose yet today BP Readings from Last 3 Encounters:  09/12/16 (!) 148/86  08/10/16 110/70  08/05/15 128/80  A/P:Continue current medications, take meds when she gets home

## 2016-09-12 NOTE — Patient Instructions (Signed)
Take BP meds when you get home. Refilled meds for a year  We will call you within a week about your referral to GI (EGD, ? Colonoscopy), orthopedics, pulmonology for sleep apnea testing. If you do not hear within 2 weeks, give [place] a call.   Normal dose flu shot right arm  Use steroid cream for hemorrhoids for up to 2 weeks- will likely recur.

## 2016-09-13 DIAGNOSIS — M76822 Posterior tibial tendinitis, left leg: Secondary | ICD-10-CM | POA: Diagnosis not present

## 2016-09-16 ENCOUNTER — Telehealth: Payer: Self-pay | Admitting: Family Medicine

## 2016-09-16 MED ORDER — HYDROCORTISONE 2.5 % RE CREA: 1.0000 "application " | TOPICAL_CREAM | Freq: Two times a day (BID) | RECTAL | 0 refills | Status: DC

## 2016-09-16 NOTE — Telephone Encounter (Signed)
Pt states her hemorrhoid is now external and the size of a marble.  Would like to know if you will lance this? Or advise on what she should do? Pt is very uncomfortable. Pt cannot see GI until November. Please advise

## 2016-09-16 NOTE — Telephone Encounter (Signed)
Patient scheduled to see Dr. Fuller Plan on 11-08-16.

## 2016-09-16 NOTE — Telephone Encounter (Signed)
Spoke to pt, asked her if she is using steroid cream? Pt said no she could not get it because [facility] did not have it and was sent to wrong pharmacy so she tried some old stuff she had. Pt asked to have Rx sent to [facility] on [address]. Told pt okay I will send Rx to [facility] and if not better by Monday please call office Dr.Hunter said he would send you to a general surgeon then. Pt verbalized understanding. Rx sent.

## 2016-09-16 NOTE — Telephone Encounter (Signed)
Is she using her steroid cream? If she has done this for a week and she is working to keep stools soft with stool softener then you may refer her to general surgery for external hemorrhoid- they can potentially remove the hemorrhoid if needed

## 2016-09-21 ENCOUNTER — Ambulatory Visit: Payer: Medicare Other | Admitting: Neurology

## 2016-10-25 ENCOUNTER — Ambulatory Visit (INDEPENDENT_AMBULATORY_CARE_PROVIDER_SITE_OTHER): Payer: Medicare Other | Admitting: Family Medicine

## 2016-10-25 DIAGNOSIS — Z23 Encounter for immunization: Secondary | ICD-10-CM | POA: Diagnosis not present

## 2016-11-03 ENCOUNTER — Ambulatory Visit: Payer: Medicare Other | Admitting: Gastroenterology

## 2016-11-07 ENCOUNTER — Encounter: Payer: Self-pay | Admitting: Pulmonary Disease

## 2016-11-07 ENCOUNTER — Ambulatory Visit (INDEPENDENT_AMBULATORY_CARE_PROVIDER_SITE_OTHER): Payer: Medicare Other | Admitting: Pulmonary Disease

## 2016-11-07 VITALS — BP 110/76 | HR 64 | Ht 65.5 in | Wt 157.8 lb

## 2016-11-07 DIAGNOSIS — R0683 Snoring: Secondary | ICD-10-CM | POA: Diagnosis not present

## 2016-11-07 NOTE — Patient Instructions (Signed)
Will arrange for home sleep study Will call to arrange for follow up after sleep study reviewed  

## 2016-11-07 NOTE — Progress Notes (Signed)
   Subjective:    Patient ID: Adriana Hendricks, female    DOB: 1935-04-27, 81 y.o.   MRN: [id number]  HPI    Review of Systems  Constitutional: Negative for fever and unexpected weight change.  HENT: Positive for trouble swallowing. Negative for congestion, dental problem, ear pain, nosebleeds, postnasal drip, rhinorrhea, sinus pressure, sneezing and sore throat.   Eyes: Negative for redness and itching.  Respiratory: Positive for shortness of breath. Negative for cough, chest tightness and wheezing.   Cardiovascular: Negative for palpitations and leg swelling.  Gastrointestinal: Positive for abdominal pain. Negative for nausea and vomiting.       Acid Heartburn  Genitourinary: Negative for dysuria.  Musculoskeletal: Positive for arthralgias and joint swelling.  Skin: Negative for rash.  Neurological: Positive for headaches.  Hematological: Does not bruise/bleed easily.  Psychiatric/Behavioral: Negative for dysphoric mood. The patient is not nervous/anxious.        Objective:   Physical Exam        Assessment & Plan:

## 2016-11-07 NOTE — Progress Notes (Signed)
Past Surgical History She  has a past surgical history that includes Appendectomy; Tonsillectomy; Cataract extraction; Moles removed; Excision Morton's neuroma; Mouth surgery; Intraocular lens implant, secondary; Carpal tunnel release; and Rotator cuff repair.  Allergies  Allergen Reactions  . Clarithromycin     REACTION: nausea  . Erythromycin Base Nausea And Vomiting  . Levofloxacin     Patient has a history of a Achilles tendon tear and developed tendon pain while on medication  . Metronidazole     REACTION: Rash  . Penicillins     Rash     Family History Her family history includes Barrett's esophagus in her son; Heart failure in her father; Other in her mother; Rectal cancer in her father.  Social History She  reports that she quit smoking about 48 years ago. Her smoking use included Cigarettes. She has a 26.00 pack-year smoking history. She has never used smokeless tobacco. She reports that she drinks about 0.6 oz of alcohol per week . She reports that she does not use drugs.  Review of systems Constitutional: Negative for fever and unexpected weight change.  HENT: Positive for trouble swallowing. Negative for congestion, dental problem, ear pain, nosebleeds, postnasal drip, rhinorrhea, sinus pressure, sneezing and sore throat.   Eyes: Negative for redness and itching.  Respiratory: Positive for shortness of breath. Negative for cough, chest tightness and wheezing.   Cardiovascular: Negative for palpitations and leg swelling.  Gastrointestinal: Positive for abdominal pain. Negative for nausea and vomiting.       Acid Heartburn  Genitourinary: Negative for dysuria.  Musculoskeletal: Positive for arthralgias and joint swelling.  Skin: Negative for rash.  Neurological: Positive for headaches.  Hematological: Does not bruise/bleed easily.  Psychiatric/Behavioral: Negative for dysphoric mood. The patient is not nervous/anxious.     Current Outpatient Prescriptions on File  Prior to Visit  Medication Sig  . Multiple Vitamins-Minerals (CENTRUM SILVER 50+WOMEN) TABS Take 1 tablet by mouth.  . nadolol-bendroflumethiazide (CORZIDE) 40-5 MG tablet Take 1 tablet by mouth daily.   No current facility-administered medications on file prior to visit.     Chief Complaint  Patient presents with  . Sleep Consult    Referred by Dr Yong Channel. Epworth Score: 10    Past medical history She  has a past medical history of Achilles tendon rupture; Carpal tunnel syndrome (09/04/2013); Collagenous colitis (2005); Colon polyps (2010); Dental infection (10/25/2014); Diverticulosis; Essential tremor; GERD (gastroesophageal reflux disease); Heart murmur; UTI (urinary tract infection); Hypertension; Lower extremity neuropathy (08/23/2013); Ocular migraine; and Peripheral neuropathy ([place]).  Vital signs BP 110/76 (BP Location: Left Arm, Cuff Size: Normal)   Pulse 64   Ht 5' 5.5" (1.664 m)   Wt 157 lb 12.8 oz (71.6 kg)   SpO2 95%   BMI 25.86 kg/m   History of Present Illness Adriana Hendricks is a 81 y.o. female for evaluation of sleep problems.  She has been told about her snoring for years.  She has to sleep in a separate room from her husband due to her snoring.  She also sleeps with her head elevated due to reflux.  She will wake up around 3 to 4 am consistently, and then can't go back to sleep.  Her mouth gets very dry at night.  She has been told that her breathing will get shallow while asleep.    She goes to sleep at 10 pm.  She falls asleep within 5 minutes.  She wakes up some times to use the bathroom.  She gets out  of bed between 415 and 615 am.  She feels tired in the morning, and wishes she could sleep longer.  She denies morning headache.  She does not use anything to help her fall sleep or stay awake.  She has a hard time staying awake while watching TV, and will often times end up taking a nap during the day.  She had extensive dental work, and has difficulty opening her  mouth fully.  She also gets trouble breathing through her nose at times.  She denies sleep walking, sleep talking, bruxism, or nightmares.  There is no history of restless legs.  She denies sleep hallucinations, sleep paralysis, or cataplexy.  The Epworth score is 10 out of 24.   Physical Exam:  General - No distress ENT - No sinus tenderness, no oral exudate, no LAN, no thyromegaly, TM clear, pupils equal/reactive, MP 3, high arched palate, narrow jaw, deviated nasal septum Cardiac - s1s2 regular, no murmur, pulses symmetric Chest - No wheeze/rales/dullness, good air entry, normal respiratory excursion Back - No focal tenderness Abd - Soft, non-tender, no organomegaly, + bowel sounds Ext - No edema Neuro - Normal strength, cranial nerves intact Skin - No rashes Psych - Normal mood, and behavior  Discussion: She has snoring, sleep disruption, daytime sleepiness, and witnessed apnea.  She has hx of hypertension.  I am concerned she could have sleep apnea.  We discussed how sleep apnea can affect various health problems, including risks for hypertension, cardiovascular disease, and diabetes.  We also discussed how sleep disruption can increase risks for accidents, such as while driving.  Weight loss as a means of improving sleep apnea was also reviewed.  Additional treatment options discussed were CPAP therapy, oral appliance, and surgical intervention.  Assessment/plan:  Snoring with concern for obstructive sleep apnea. - will arrange for home sleep study to further assess   Patient Instructions  Will arrange for home sleep study Will call to arrange for follow up after sleep study reviewed     Chesley Mires, M.D. Pager [phone] 11/07/2016, 2:09 PM

## 2016-11-08 ENCOUNTER — Ambulatory Visit (INDEPENDENT_AMBULATORY_CARE_PROVIDER_SITE_OTHER): Payer: Medicare Other | Admitting: Gastroenterology

## 2016-11-08 ENCOUNTER — Encounter: Payer: Self-pay | Admitting: Gastroenterology

## 2016-11-08 VITALS — BP 136/74 | HR 60 | Ht 65.5 in | Wt 156.4 lb

## 2016-11-08 DIAGNOSIS — K219 Gastro-esophageal reflux disease without esophagitis: Secondary | ICD-10-CM | POA: Diagnosis not present

## 2016-11-08 DIAGNOSIS — R1310 Dysphagia, unspecified: Secondary | ICD-10-CM

## 2016-11-08 DIAGNOSIS — R140 Abdominal distension (gaseous): Secondary | ICD-10-CM | POA: Diagnosis not present

## 2016-11-08 DIAGNOSIS — K5909 Other constipation: Secondary | ICD-10-CM | POA: Diagnosis not present

## 2016-11-08 DIAGNOSIS — K6289 Other specified diseases of anus and rectum: Secondary | ICD-10-CM

## 2016-11-08 DIAGNOSIS — R1319 Other dysphagia: Secondary | ICD-10-CM

## 2016-11-08 NOTE — Patient Instructions (Signed)
Start taking the omeprazole capsules you have at home once daily.  You have been given a Low-gas diet.   You can take over the counter Gas-X four times a day for gas and bloating.   Start Miralax and Colace daily.   You have been scheduled for an endoscopy. Please follow written instructions given to you at your visit today. If you use inhalers (even only as needed), please bring them with you on the day of your procedure. Your physician has requested that you go to www.startemmi.com and enter the access code given to you at your visit today. This web site gives a general overview about your procedure. However, you should still follow specific instructions given to you by our office regarding your preparation for the procedure.  Thank you for choosing me and [facility].  Pricilla Riffle. Dagoberto Ligas., MD., [facility]

## 2016-11-08 NOTE — Progress Notes (Signed)
    History of Present Illness: This is a 81 year old female with GERD, dysphagia, N/V, constipation, gas, bloating. Former pt of Dr. Olevia Perches. She relates ongoing difficulties with constipation and straining with bowel movements associated with bloating and then she will have several small stools and several loose stools. She notes frequent brief rectal spasms that last for several seconds and then abate. She was recently evaluated by Dr. Yong Channel and treated for hemorrhoids which improved with topical steroid cream. She has frequent solid food dysphagia, regurgitation, heartburn and reflux symptoms however she has decided against taking a PPI or ranitidine on a regular basis as previously recommended by Dr. Olevia Perches.   Colonoscopy 09/2014 1. Two sessile polyps were found in the sigmoid colon x1 and at the cecum x 1; polypectomy was performed with cold forceps in sigmoid colon and cold snare in the cecum 2. There was severe diverticulosis noted in the sigmoid colon causing partial obstruction, and difficulty in advancing the pediatric scope through the sigmoid colon  EGD 09/2014  Benign distal esophageal stricture 9 mm in diameter, dilated with Savary dilators to 15 mm Small hiatal hernia  Current Medications, Allergies, Past Medical History, Past Surgical History, Family History and Social History were reviewed in [employer] record.  Physical Exam: General: Well developed, well nourished, no acute distress Head: Normocephalic and atraumatic Eyes:  sclerae anicteric, EOMI Ears: Normal auditory acuity Mouth: No deformity or lesions Lungs: Clear throughout to auscultation Heart: Regular rate and rhythm; no murmurs, rubs or bruits Abdomen: Soft, non tender and non distended. No masses, hepatosplenomegaly or hernias noted. Normal Bowel sounds Rectal: no lesions, no tenderness, heme neg stool Musculoskeletal: Symmetrical with no gross deformities  Pulses:  Normal pulses  noted Extremities: No clubbing, cyanosis, edema or deformities noted Neurological: Alert oriented x 4, grossly nonfocal Psychological:  Alert and cooperative. Anxious.  Assessment and Recommendations:  1. GERD with dysphagia. Poorly controlled GERD, possible esophagitis and suspected recurrent esophageal stricture. She is advised to follow all standard antireflux measures. She is advised to resume omeprazole 20 mg daily and take on a long-term basis to manage her chronic GERD and her recurrent esophageal strictures. If she declines to do this I recommended ranitidine 300 mg twice daily long-term. She seems very reluctant to take any GERD medication long-term despite an explanation of the importance and despite her poorly controlled symptoms.  2. Constipation, gas, bloating. Possible partial obstructive symptoms related to sigmoid colon diverticular stricture. Possible constipation and IBS. Daily MiraLAX and daily Colace. Gas-X 4 times a day when necessary. Low gas diet. If symptoms not improving will increase dosing of MiraLAX and Colace and consider a CT scan and/or air-contrast barium enema to further evaluate her diverticular stricture noted at last colonoscopy.  3. Personal history of adenomatous colon polyps. Last colonoscopy in 09/2014 and given her age do not recommend future routine surveillance colonoscopy.   4. Intermittent rectal pain. Presumed proctalgia fugax. Trial of Advil 4-600 mg 3 times a day when necessary. His hemorrhoidal symptoms resolved. Improved management of constipation as above.

## 2016-12-08 ENCOUNTER — Ambulatory Visit (AMBULATORY_SURGERY_CENTER): Payer: Medicare Other | Admitting: Gastroenterology

## 2016-12-08 ENCOUNTER — Encounter: Payer: Self-pay | Admitting: Gastroenterology

## 2016-12-08 VITALS — BP 129/69 | HR 56 | Temp 96.8°F | Resp 12 | Ht 65.0 in | Wt 156.0 lb

## 2016-12-08 DIAGNOSIS — R1310 Dysphagia, unspecified: Secondary | ICD-10-CM | POA: Diagnosis not present

## 2016-12-08 DIAGNOSIS — R1319 Other dysphagia: Secondary | ICD-10-CM

## 2016-12-08 DIAGNOSIS — K219 Gastro-esophageal reflux disease without esophagitis: Secondary | ICD-10-CM

## 2016-12-08 MED ORDER — SODIUM CHLORIDE 0.9 % IV SOLN: 500.0000 mL | INTRAVENOUS | Status: DC

## 2016-12-08 NOTE — Patient Instructions (Signed)
YOU HAD AN ENDOSCOPIC PROCEDURE TODAY AT [facility]:   Refer to the procedure report that was given to you for any specific questions about what was found during the examination.  If the procedure report does not answer your questions, please call your gastroenterologist to clarify.  If you requested that your care partner not be given the details of your procedure findings, then the procedure report has been included in a sealed envelope for you to review at your convenience later.  YOU SHOULD EXPECT: Some feelings of bloating in the abdomen. Passage of more gas than usual.  Walking can help get rid of the air that was put into your GI tract during the procedure and reduce the bloating. If you had a lower endoscopy (such as a colonoscopy or flexible sigmoidoscopy) you may notice spotting of blood in your stool or on the toilet paper. If you underwent a bowel prep for your procedure, you may not have a normal bowel movement for a few days.  Please Note:  You might notice some irritation and congestion in your nose or some drainage.  This is from the oxygen used during your procedure.  There is no need for concern and it should clear up in a day or so.  SYMPTOMS TO REPORT IMMEDIATELY:   Following upper endoscopy (EGD)  Vomiting of blood or coffee ground material  New chest pain or pain under the shoulder blades  Painful or persistently difficult swallowing  New shortness of breath  Fever of 100F or higher  Black, tarry-looking stools  For urgent or emergent issues, a gastroenterologist can be reached at any hour by calling [phone].   DIET:  We do recommend a small meal at first, but then you may proceed to your regular diet.  Drink plenty of fluids but you should avoid alcoholic beverages for 24 hours.  ACTIVITY:  You should plan to take it easy for the rest of today and you should NOT DRIVE or use heavy machinery until tomorrow (because of the sedation medicines used  during the test).    FOLLOW UP: Our staff will call the number listed on your records the next business day following your procedure to check on you and address any questions or concerns that you may have regarding the information given to you following your procedure. If we do not reach you, we will leave a message.  However, if you are feeling well and you are not experiencing any problems, there is no need to return our call.  We will assume that you have returned to your regular daily activities without incident.  If any biopsies were taken you will be contacted by phone or by letter within the next 1-3 weeks.  Please call us at [phone] if you have not heard about the biopsies in 3 weeks.    SIGNATURES/CONFIDENTIALITY: You and/or your care partner have signed paperwork which will be entered into your electronic medical record.  These signatures attest to the fact that that the information above on your After Visit Summary has been reviewed and is understood.  Full responsibility of the confidentiality of this discharge information lies with you and/or your care-partner.  Esophagitis (handout given) Esophageal Stenosis (handout given) Dilatation Diet (handout given) Hiatal Hernia (handout given)

## 2016-12-08 NOTE — Progress Notes (Signed)
A/ox3 pleased with MAC, report to Michelle RN 

## 2016-12-08 NOTE — Op Note (Signed)
[facility] Patient Name: Adriana Hendricks Procedure Date: 12/08/2016 10:10 AM MRN: [id number] Endoscopist: Ladene Artist , MD Age: 81 Referring MD:  Date of Birth: 1935-07-22 Gender: Female Account #: [id number] Procedure:                Upper GI endoscopy Indications:              Dysphagia, Gastro-esophageal reflux disease Medicines:                Monitored Anesthesia Care Procedure:                Pre-Anesthesia Assessment:                           - Prior to the procedure, a History and Physical                            was performed, and patient medications and                            allergies were reviewed. The patient's tolerance of                            previous anesthesia was also reviewed. The risks                            and benefits of the procedure and the sedation                            options and risks were discussed with the patient.                            All questions were answered, and informed consent                            was obtained. Prior Anticoagulants: The patient has                            taken no previous anticoagulant or antiplatelet                            agents. ASA Grade Assessment: II - A patient with                            mild systemic disease. After reviewing the risks                            and benefits, the patient was deemed in                            satisfactory condition to undergo the procedure.                           After obtaining informed consent, the endoscope was  passed under direct vision. Throughout the                            procedure, the patient's blood pressure, pulse, and                            oxygen saturations were monitored continuously. The                            Model GIF-HQ190 [id number]) scope was introduced                            through the mouth, and advanced to the second part                            of  duodenum. The upper GI endoscopy was                            accomplished without difficulty. The patient                            tolerated the procedure well. Scope In: Scope Out: Findings:                 LA Grade B (one or more mucosal breaks greater than                            5 mm, not extending between the tops of two mucosal                            folds) esophagitis with no bleeding was found in                            the distal esophagus.                           One moderate benign-appearing, intrinsic stenosis                            was found at the gastroesophageal junction. This                            measured 1.2 cm (inner diameter) and was traversed.                            A guidewire was placed and the scope was withdrawn.                            Dilation was performed with a Savary dilators with                            mild resistance at 13 mm, 14 mm and 15 mm.  Estimated blood loss: none.                           The exam of the esophagus was otherwise normal.                           A small hiatal hernia was present.                           The exam of the stomach was otherwise normal.                           The duodenal bulb and second portion of the                            duodenum were normal. Complications:            No immediate complications. Estimated Blood Loss:     Estimated blood loss: none. Impression:               - LA Grade B reflux esophagitis.                           - Benign-appearing esophageal stenosis. Dilated.                           - Small hiatal hernia.                           - Normal duodenal bulb and second portion of the                            duodenum.                           - No specimens collected. Recommendation:           - Patient has a contact number available for                            emergencies. The signs and symptoms of potential                             delayed complications were discussed with the                            patient. Return to normal activities tomorrow.                            Written discharge instructions were provided to the                            patient.                           - Continue present medications.                           -  Await pathology results.                           - Clear liquid diet for 2 hours then advance as                            tolerated to soft diet today and resume prior diet                            tomorrow.                           - Office appt in 2 months Ladene Artist, MD 12/08/2016 10:26:03 AM This report has been signed electronically.

## 2016-12-08 NOTE — Progress Notes (Signed)
Called to room to assist during endoscopic procedure.  Patient ID and intended procedure confirmed with present staff. Received instructions for my participation in the procedure from the performing physician.  

## 2016-12-09 ENCOUNTER — Telehealth: Payer: Self-pay

## 2016-12-09 NOTE — Telephone Encounter (Signed)
  Follow up Call-  Call back number 12/08/2016 10/08/2014  Post procedure Call Back phone  # [phone]  Permission to leave phone message Yes Yes  Some recent data might be hidden     Patient questions:  Do you have a fever, pain , or abdominal swelling? No. Pain Score  0 *  Have you tolerated food without any problems? Yes.    Have you been able to return to your normal activities? Yes.    Do you have any questions about your discharge instructions: Diet   No. Medications  No. Follow up visit  No.  Do you have questions or concerns about your Care? No.  Actions: * If pain score is 4 or above: No action needed, pain <4.

## 2016-12-14 DIAGNOSIS — G4733 Obstructive sleep apnea (adult) (pediatric): Secondary | ICD-10-CM | POA: Diagnosis not present

## 2016-12-21 ENCOUNTER — Telehealth: Payer: Self-pay | Admitting: Pulmonary Disease

## 2016-12-21 ENCOUNTER — Encounter: Payer: Self-pay | Admitting: Pulmonary Disease

## 2016-12-21 DIAGNOSIS — G4733 Obstructive sleep apnea (adult) (pediatric): Secondary | ICD-10-CM | POA: Insufficient documentation

## 2016-12-21 HISTORY — DX: Obstructive sleep apnea (adult) (pediatric): G47.33

## 2016-12-21 NOTE — Telephone Encounter (Signed)
HST 12/14/16 >> AHI 19.5, SaO2 low 78%    Will have my nurse inform pt that sleep study shows moderate sleep apnea.  Options are 1) CPAP now, 2) ROV first.  If She is agreeable to CPAP, then please send order for auto CPAP range 5 to 15 cm H2O with heated humidity and mask of choice.  Have download sent 1 month after starting CPAP and set up ROV 2 months after starting CPAP.  ROV can be with me or NP.

## 2016-12-22 ENCOUNTER — Other Ambulatory Visit: Payer: Self-pay | Admitting: *Deleted

## 2016-12-22 DIAGNOSIS — R0683 Snoring: Secondary | ICD-10-CM

## 2016-12-22 DIAGNOSIS — G4733 Obstructive sleep apnea (adult) (pediatric): Secondary | ICD-10-CM | POA: Diagnosis not present

## 2016-12-28 DIAGNOSIS — H353131 Nonexudative age-related macular degeneration, bilateral, early dry stage: Secondary | ICD-10-CM | POA: Diagnosis not present

## 2016-12-28 DIAGNOSIS — H52203 Unspecified astigmatism, bilateral: Secondary | ICD-10-CM | POA: Diagnosis not present

## 2016-12-28 DIAGNOSIS — H43813 Vitreous degeneration, bilateral: Secondary | ICD-10-CM | POA: Diagnosis not present

## 2016-12-28 DIAGNOSIS — H04123 Dry eye syndrome of bilateral lacrimal glands: Secondary | ICD-10-CM | POA: Diagnosis not present

## 2016-12-29 ENCOUNTER — Telehealth: Payer: Self-pay | Admitting: Pulmonary Disease

## 2016-12-29 NOTE — Telephone Encounter (Signed)
Called and spoke to pt. Informed her of the results and recs per VS. Pt verbalized understanding and states she would like to come in for a visit first. Appt made with TP for 1.5.18. Pt verbalized understanding and denied any further questions or concerns at this time.

## 2016-12-29 NOTE — Telephone Encounter (Signed)
Please phone note from 12.27.17 for results. Will sign off.

## 2016-12-30 ENCOUNTER — Encounter: Payer: Self-pay | Admitting: Adult Health

## 2016-12-30 ENCOUNTER — Ambulatory Visit (INDEPENDENT_AMBULATORY_CARE_PROVIDER_SITE_OTHER): Payer: Medicare Other | Admitting: Adult Health

## 2016-12-30 VITALS — BP 126/66 | HR 56 | Ht 65.5 in | Wt 160.0 lb

## 2016-12-30 DIAGNOSIS — G4733 Obstructive sleep apnea (adult) (pediatric): Secondary | ICD-10-CM | POA: Diagnosis not present

## 2016-12-30 NOTE — Assessment & Plan Note (Signed)
Moderate obstructive sleep apnea. Patient would like to proceed with nocturnal C Pap Education provided  Plan  Patient Instructions  Begin CPAP (Auto set  5 to 15 cm H2O with heated humidity and mask of choice.)  Goal is to wear for at least 4hr each night  CPAP download in 4 weeks after starting CPAP .  Do not drive if sleepy .  Follow up with Dr. Halford Chessman  In 2 months and As needed

## 2016-12-30 NOTE — Progress Notes (Signed)
I have reviewed and agree with assessment/plan.  Chesley Mires, MD [facility] Pulmonary/Critical Care 12/30/2016, 10:20 AM Pager:  [phone]

## 2016-12-30 NOTE — Progress Notes (Signed)
@Patient  ID: Adriana Hendricks, female    DOB: August 26, 1935, 82 y.o.   MRN: [id number]  Chief Complaint  Patient presents with  . Follow-up    OSA     Referring provider: Marin Olp, MD  HPI: 82 year old female seen for a sleep consult 11/07/2016 for daytime sleepiness and snoring. Found to have moderate sleep apnea  TEST  HST 12/14/16 >> AHI 19.5, SaO2 low 78%  12/30/2016 Follow up : Moderate OSA  Patient returns for a two-month follow-up. Patient was seen last visit for a sleep consult for daytime sleepiness. She was set up for home sleep study that showed moderate sleep apnea with AHI 19.5. SaO2 low 78%. We discussed her test results and patient would like to proceed with C Pap at bedtime Patient education was provided on sleep apnea and C Pap usage.  She does have significant daytime sleepiness and nighttime snoring and restless sleep.  Allergies  Allergen Reactions  . Clarithromycin     REACTION: nausea  . Erythromycin Base Nausea And Vomiting  . Levofloxacin     Patient has a history of a Achilles tendon tear and developed tendon pain while on medication  . Metronidazole     REACTION: Rash  . Penicillins     Rash     Immunization History  Administered Date(s) Administered  . Influenza Split 12/01/2011, 11/05/2012  . Influenza Whole 01/07/2010, 10/08/2010  . Influenza, High Dose Seasonal PF 10/01/2014, 11/10/2015, 10/25/2016  . Influenza,inj,Quad PF,36+ Mos 08/23/2013  . Pneumococcal Conjugate-13 04/13/2015  . Pneumococcal Polysaccharide-23 12/26/2005  . Td 12/26/2001  . Tdap 01/23/2012  . Zoster 02/08/2010    Past Medical History:  Diagnosis Date  . Achilles tendon rupture   . Arthritis of hand, right   . Carpal tunnel syndrome 09/04/2013  . Collagenous colitis 2005  . Colon polyps 2010   Tubular adenoma and hyperplastic  . Dental infection 10/25/2014  . Diverticulosis   . Essential tremor   . GERD (gastroesophageal reflux disease)    hx hiatal  hernia, hx esophagitis, hx stricture  . Heart murmur   . Hx: UTI (urinary tract infection)   . Hypertension   . Lower extremity neuropathy 08/23/2013   On B12 therapy with numbness in the feet bilaterally no evidence of diabetes   . Ocular migraine    jagged vision, a few per month  . OSA (obstructive sleep apnea) 12/21/2016  . Peripheral neuropathy ([place])    treated by Dr Posey Pronto (07/2015)    Tobacco History: History  Smoking Status  . Former Smoker  . Packs/day: 2.00  . Years: 13.00  . Types: Cigarettes  . Quit date: 1969  Smokeless Tobacco  . Never Used    Comment: Quit in 1969 - smokeed 2-3PPD , was 3 PPD at her heaviest for about 3 years.    Counseling given: Not Answered   Outpatient Encounter Prescriptions as of 12/30/2016  Medication Sig  . Multiple Vitamins-Minerals (CENTRUM SILVER 50+WOMEN) TABS Take 1 tablet by mouth.  . nadolol-bendroflumethiazide (CORZIDE) 40-5 MG tablet Take 1 tablet by mouth daily.   Facility-Administered Encounter Medications as of 12/30/2016  Medication  . 0.9 %  sodium chloride infusion     Review of Systems  Constitutional:   No  weight loss, night sweats,  Fevers, chills,  +fatigue, or  lassitude.  HEENT:   No headaches,  Difficulty swallowing,  Tooth/dental problems, or  Sore throat,  No sneezing, itching, ear ache, nasal congestion, post nasal drip,   CV:  No chest pain,  Orthopnea, PND, swelling in lower extremities, anasarca, dizziness, palpitations, syncope.   GI  No heartburn, indigestion, abdominal pain, nausea, vomiting, diarrhea, change in bowel habits, loss of appetite, bloody stools.   Resp: No shortness of breath with exertion or at rest.  No excess mucus, no productive cough,  No non-productive cough,  No coughing up of blood.  No change in color of mucus.  No wheezing.  No chest wall deformity  Skin: no rash or lesions.  GU: no dysuria, change in color of urine, no urgency or frequency.  No flank pain, no  hematuria   MS:  No joint pain or swelling.  No decreased range of motion.  No back pain.    Physical Exam  BP 126/66 (BP Location: Left Arm, Cuff Size: Normal)   Pulse (!) 56   Ht 5' 5.5" (1.664 m)   Wt 160 lb (72.6 kg)   SpO2 95%   BMI 26.22 kg/m   GEN: A/Ox3; pleasant , NAD, well nourished    HEENT:  [place]/AT,  EACs-clear, TMs-wnl, NOSE-clear, THROAT-clear, no lesions, no postnasal drip or exudate noted. Class 2-3 MP airway   NECK:  Supple w/ fair ROM; no JVD; normal carotid impulses w/o bruits; no thyromegaly or nodules palpated; no lymphadenopathy.    RESP  Clear  P & A; w/o, wheezes/ rales/ or rhonchi. no accessory muscle use, no dullness to percussion  CARD:  RRR, no m/r/g, no peripheral edema, pulses intact, no cyanosis or clubbing.  GI:   Soft & nt; nml bowel sounds; no organomegaly or masses detected.   Musco: Warm bil, no deformities or joint swelling noted.   Neuro: alert, no focal deficits noted.    Skin: Warm, no lesions or rashes  Psych:  No change in mood or affect. No depression or anxiety.  No memory loss.  Lab Results:  CBC    Component Value Date/Time   WBC 6.1 08/23/2013 1036   RBC 4.63 08/23/2013 1036   HGB 14.4 08/23/2013 1036   HCT 42.5 08/23/2013 1036   PLT 221.0 08/23/2013 1036   MCV 91.6 08/23/2013 1036   MCHC 33.9 08/23/2013 1036   RDW 13.1 08/23/2013 1036   LYMPHSABS 1.0 08/23/2013 1036   MONOABS 0.5 08/23/2013 1036   EOSABS 0.1 08/23/2013 1036   BASOSABS 0.0 08/23/2013 1036    BMET    Component Value Date/Time   NA 131 (L) 08/05/2015 0912   K 4.3 08/05/2015 0912   CL 92 (L) 08/05/2015 0912   CO2 30 08/05/2015 0912   GLUCOSE 98 08/05/2015 0912   BUN 12 08/05/2015 0912   CREATININE 0.64 08/05/2015 0912   CALCIUM 9.6 08/05/2015 0912   GFRNONAA 90.06 11/22/2010 0955   GFRAA 127 12/04/2008 0817    BNP No results found for: BNP  ProBNP No results found for: PROBNP  Imaging: No results found.   Assessment & Plan:    OSA (obstructive sleep apnea) Moderate obstructive sleep apnea. Patient would like to proceed with nocturnal C Pap Education provided  Plan  Patient Instructions  Begin CPAP (Auto set  5 to 15 cm H2O with heated humidity and mask of choice.)  Goal is to wear for at least 4hr each night  CPAP download in 4 weeks after starting CPAP .  Do not drive if sleepy .  Follow up with Dr. Halford Chessman  In 2 months and As needed  Rexene Edison, NP 12/30/2016

## 2016-12-30 NOTE — Patient Instructions (Signed)
Begin CPAP (Auto set  5 to 15 cm H2O with heated humidity and mask of choice.)  Goal is to wear for at least 4hr each night  CPAP download in 4 weeks after starting CPAP .  Do not drive if sleepy .  Follow up with Dr. Halford Chessman  In 2 months and As needed

## 2017-02-08 ENCOUNTER — Encounter: Payer: Self-pay | Admitting: Adult Health

## 2017-02-08 ENCOUNTER — Ambulatory Visit (INDEPENDENT_AMBULATORY_CARE_PROVIDER_SITE_OTHER): Payer: Medicare Other | Admitting: Adult Health

## 2017-02-08 VITALS — BP 124/74 | HR 56 | Ht 66.0 in | Wt 160.2 lb

## 2017-02-08 DIAGNOSIS — G4733 Obstructive sleep apnea (adult) (pediatric): Secondary | ICD-10-CM

## 2017-02-08 NOTE — Progress Notes (Signed)
@Patient  ID: Adriana Hendricks, female    DOB: Apr 09, 1935, 82 y.o.   MRN: [id number]  Chief Complaint  Patient presents with  . Follow-up    OSA    Referring provider: Marin Olp, MD  HPI: 82 year old female seen for a sleep consult 11/07/2016 for daytime sleepiness and snoring. Found to have moderate sleep apnea  TEST  HST 12/14/16 >> AHI 19.5, SaO2 low 78%  02/08/2017 Follow up: OSA  Pt returns for follow up for OSA . She was started on CPAP last month but is having trouble tolerating mask. The full face mask slips up during the night , wakes her up .  She is not sleeping good. Would like to try a nasal mask . Needs chin strap.    Allergies  Allergen Reactions  . Clarithromycin     REACTION: nausea  . Erythromycin Base Nausea And Vomiting  . Levofloxacin     Patient has a history of a Achilles tendon tear and developed tendon pain while on medication  . Metronidazole     REACTION: Rash  . Penicillins     Rash     Immunization History  Administered Date(s) Administered  . Influenza Split 12/01/2011, 11/05/2012  . Influenza Whole 01/07/2010, 10/08/2010  . Influenza, High Dose Seasonal PF 10/01/2014, 11/10/2015, 10/25/2016  . Influenza,inj,Quad PF,36+ Mos 08/23/2013  . Pneumococcal Conjugate-13 04/13/2015  . Pneumococcal Polysaccharide-23 12/26/2005  . Td 12/26/2001  . Tdap 01/23/2012  . Zoster 02/08/2010    Past Medical History:  Diagnosis Date  . Achilles tendon rupture   . Arthritis of hand, right   . Carpal tunnel syndrome 09/04/2013  . Collagenous colitis 2005  . Colon polyps 2010   Tubular adenoma and hyperplastic  . Dental infection 10/25/2014  . Diverticulosis   . Esophageal stenosis   . Essential tremor   . GERD (gastroesophageal reflux disease)    hx hiatal hernia, hx esophagitis, hx stricture  . Heart murmur   . Hiatal hernia   . Hx: UTI (urinary tract infection)   . Hypertension   . Lower extremity neuropathy 08/23/2013   On B12  therapy with numbness in the feet bilaterally no evidence of diabetes   . Ocular migraine    jagged vision, a few per month  . OSA (obstructive sleep apnea) 12/21/2016  . Peripheral neuropathy ([place])    treated by Dr Posey Pronto (07/2015)    Tobacco History: History  Smoking Status  . Former Smoker  . Packs/day: 2.00  . Years: 13.00  . Types: Cigarettes  . Quit date: 1969  Smokeless Tobacco  . Never Used    Comment: Quit in 1969 - smokeed 2-3PPD , was 3 PPD at her heaviest for about 3 years.    Counseling given: Not Answered   Outpatient Encounter Prescriptions as of 02/08/2017  Medication Sig  . Cholecalciferol (VITAMIN D3) 1000 units CAPS Take 1 capsule by mouth daily.  . Multiple Vitamins-Minerals (CENTRUM SILVER 50+WOMEN) TABS Take 1 tablet by mouth.  . Multiple Vitamins-Minerals (ICAPS AREDS 2) CAPS Take 1 capsule by mouth daily.  . nadolol-bendroflumethiazide (CORZIDE) 40-5 MG tablet Take 1 tablet by mouth daily.  . Turmeric 500 MG CAPS Take 1 capsule by mouth daily.   Facility-Administered Encounter Medications as of 02/08/2017  Medication  . 0.9 %  sodium chloride infusion     Review of Systems  Constitutional:   No  weight loss, night sweats,  Fevers, chills, + fatigue, or  lassitude.  HEENT:  No headaches,  Difficulty swallowing,  Tooth/dental problems, or  Sore throat,                No sneezing, itching, ear ache, nasal congestion, post nasal drip,   CV:  No chest pain,  Orthopnea, PND, swelling in lower extremities, anasarca, dizziness, palpitations, syncope.   GI  No heartburn, indigestion, abdominal pain, nausea, vomiting, diarrhea, change in bowel habits, loss of appetite, bloody stools.   Resp: No shortness of breath with exertion or at rest.  No excess mucus, no productive cough,  No non-productive cough,  No coughing up of blood.  No change in color of mucus.  No wheezing.  No chest wall deformity  Skin: no rash or lesions.  GU: no dysuria, change in  color of urine, no urgency or frequency.  No flank pain, no hematuria   MS:  No joint pain or swelling.  No decreased range of motion.  No back pain.    Physical Exam  BP 124/74 (BP Location: Left Arm, Cuff Size: Normal)   Pulse (!) 56   Ht 5\' 6"  (1.676 m)   Wt 160 lb 3.2 oz (72.7 kg)   SpO2 99%   BMI 25.86 kg/m   GEN: A/Ox3; pleasant , NAD, well nourished    HEENT:  [place]/AT,  EACs-clear, TMs-wnl, NOSE-clear, THROAT-clear, no lesions, no postnasal drip or exudate noted. Class 2 MP airway   NECK:  Supple w/ fair ROM; no JVD; normal carotid impulses w/o bruits; no thyromegaly or nodules palpated; no lymphadenopathy.    RESP  Clear  P & A; w/o, wheezes/ rales/ or rhonchi. no accessory muscle use, no dullness to percussion  CARD:  RRR, no m/r/g, no peripheral edema, pulses intact, no cyanosis or clubbing.  GI:   Soft & nt; nml bowel sounds; no organomegaly or masses detected.   Musco: Warm bil, no deformities or joint swelling noted.   Neuro: alert, no focal deficits noted.    Skin: Warm, no lesions or rashes    Lab Results:  CBC    Component Value Date/Time   WBC 6.1 08/23/2013 1036   RBC 4.63 08/23/2013 1036   HGB 14.4 08/23/2013 1036   HCT 42.5 08/23/2013 1036   PLT 221.0 08/23/2013 1036   MCV 91.6 08/23/2013 1036   MCHC 33.9 08/23/2013 1036   RDW 13.1 08/23/2013 1036   LYMPHSABS 1.0 08/23/2013 1036   MONOABS 0.5 08/23/2013 1036   EOSABS 0.1 08/23/2013 1036   BASOSABS 0.0 08/23/2013 1036    BMET    Component Value Date/Time   NA 131 (L) 08/05/2015 0912   K 4.3 08/05/2015 0912   CL 92 (L) 08/05/2015 0912   CO2 30 08/05/2015 0912   GLUCOSE 98 08/05/2015 0912   BUN 12 08/05/2015 0912   CREATININE 0.64 08/05/2015 0912   CALCIUM 9.6 08/05/2015 0912   GFRNONAA 90.06 11/22/2010 0955   GFRAA 127 12/04/2008 0817    BNP No results found for: BNP  ProBNP No results found for: PROBNP  Imaging: No results found.   Assessment & Plan:   OSA (obstructive  sleep apnea) Moderate OSA   Plan  Patient Instructions  Continue on CPAP .  Change to nasal mask with chin strap.  Goal is to wear for at least 4hr each night  Do not drive if sleepy .  Follow up with Dr. Halford Chessman  In 1  months and As needed         Rexene Edison, NP 02/08/2017

## 2017-02-08 NOTE — Patient Instructions (Signed)
Continue on CPAP .  Change to nasal mask with chin strap.  Goal is to wear for at least 4hr each night  Do not drive if sleepy .  Follow up with Dr. Halford Chessman  In 1  months and As needed

## 2017-02-08 NOTE — Assessment & Plan Note (Signed)
Moderate OSA   Plan  Patient Instructions  Continue on CPAP .  Change to nasal mask with chin strap.  Goal is to wear for at least 4hr each night  Do not drive if sleepy .  Follow up with Dr. Halford Chessman  In 1  months and As needed

## 2017-02-10 NOTE — Progress Notes (Signed)
I have reviewed and agree with assessment/plan.  Chesley Mires, MD [facility] Pulmonary/Critical Care 02/10/2017, 8:55 AM Pager:  [phone]

## 2017-02-15 ENCOUNTER — Ambulatory Visit (INDEPENDENT_AMBULATORY_CARE_PROVIDER_SITE_OTHER): Payer: Medicare Other | Admitting: Gastroenterology

## 2017-02-15 ENCOUNTER — Encounter: Payer: Self-pay | Admitting: Gastroenterology

## 2017-02-15 VITALS — BP 132/70 | HR 60 | Ht 65.75 in | Wt 161.0 lb

## 2017-02-15 DIAGNOSIS — K2100 Gastro-esophageal reflux disease with esophagitis, without bleeding: Secondary | ICD-10-CM

## 2017-02-15 DIAGNOSIS — K6289 Other specified diseases of anus and rectum: Secondary | ICD-10-CM | POA: Diagnosis not present

## 2017-02-15 MED ORDER — RANITIDINE HCL 300 MG PO CAPS: 300.0000 mg | ORAL_CAPSULE | Freq: Two times a day (BID) | ORAL | 3 refills | Status: DC

## 2017-02-15 NOTE — Patient Instructions (Signed)
We have printed a prescription for ranitidine 300 mg tablet for you to take twice daily.   Thank you for choosing me and [facility].  Pricilla Riffle. Dagoberto Ligas., MD., [facility]

## 2017-02-15 NOTE — Progress Notes (Signed)
    History of Present Illness: This is a 82 year old female with intermittent rectal pain for a few months. Hemorrhoids found on DRE by Dr .Yong Channel in 08/2016. Not associated with bowel movements symptoms are brief in duration, a few minutes. Her dysphagia has resolved following EGD with dilation in December. Despite my recommendation she declines to take a PPI as she feels like "she will get hooked on PPIs". Currently takes ranitidine 150 mg at bedtime and feels this controls her reflux symptoms.  Colonoscopy 09/2014 Dr. Olevia Perches ENDOSCOPIC IMPRESSION: 1. Two sessile polyps were found in the sigmoid colon x1 and at the cecum x 1; polypectomy was performed with cold forceps in sigmoid colon and cold snare in the cecum 2. There was severe diverticulosis noted in the sigmoid colon causing partial obstruction, and difficulty in advancing the pediatric scope through the sigmoid colon  Current Medications, Allergies, Past Medical History, Past Surgical History, Family History and Social History were reviewed in [employer] record.  Physical Exam: General: Well developed, well nourished, no acute distress Head: Normocephalic and atraumatic Eyes:  sclerae anicteric, EOMI Ears: Normal auditory acuity Mouth: No deformity or lesions Lungs: Clear throughout to auscultation Heart: Regular rate and rhythm; no murmurs, rubs or bruits Abdomen: Soft, non tender and non distended. No masses, hepatosplenomegaly or hernias noted. Normal Bowel sounds Rectal: No external or internal lesions, no tenderness Hemoccult negative soft brown stool in the vault Musculoskeletal: Symmetrical with no gross deformities  Pulses:  Normal pulses noted Extremities: No clubbing, cyanosis, edema or deformities noted Neurological: Alert oriented x 4, grossly nonfocal Psychological:  Alert and cooperative. Normal mood and affect  Assessment and Recommendations:  1. 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 Internal hemorrhoids, currently asymptomatic. Pt reassured. Preparation H suppositories twice a day when necessary. Rectal care instructions as needed.  2. GERD with erosive esophagitis and an esophageal stricture. EGD with dilation in 11/2016. Antireflux measures. Despite my recommendation for improved management of erosive esophagitis and esophageal strictures she declines PPI therapy. Increase ranitidine to 300 mg po bid for better long term control of GERD.

## 2017-03-06 ENCOUNTER — Ambulatory Visit: Payer: Medicare Other | Admitting: Pulmonary Disease

## 2017-03-09 ENCOUNTER — Telehealth: Payer: Self-pay

## 2017-03-09 ENCOUNTER — Ambulatory Visit (INDEPENDENT_AMBULATORY_CARE_PROVIDER_SITE_OTHER): Payer: Medicare Other

## 2017-03-09 VITALS — BP 138/70 | HR 61 | Ht 66.0 in | Wt 163.0 lb

## 2017-03-09 DIAGNOSIS — Z0000 Encounter for general adult medical examination without abnormal findings: Secondary | ICD-10-CM

## 2017-03-09 NOTE — Progress Notes (Signed)
I have reviewed and agree with note, evaluation, plan.   Maricarmen Braziel, MD  

## 2017-03-09 NOTE — Progress Notes (Addendum)
Subjective:   Adriana Hendricks is a 82 y.o. female who presents for Medicare Annual (Subsequent) preventive examination.  Psychosocial Lives with spouse 2 children  Son lives [place] Dtr Midwife;   Acupuncturist as very good Does have occular migraines Lose vision in the center of her vision; OU Brightness triggers;  These are off and on; did copy a record that she is keeping   Just started on sleep apnea ( 4 months to get cpap) Chin strap with nasal prongs and now sleeps with head phones Father had rectal cancer; HF  Barretts esophagus  Dr. Fuller Plan seeing for  Rectal pain   Preventive health Mammogram; 2012; does not want to have another due to radiation exposure; will consider GYN apt and may discuss the need with GYN (seeing for bladder issues)  Colonoscopy 09/2014 due 09/2019 for repeat  Bone density -1.3 / does not exercise much but did discuss risk of falls and agreed to try to start walking or other    Diet Eats egg; bacon and rarely bread in am  Caraway seed bread Lunch; main meal  Cooks meat and vegetables; spaghetti  Dinner light veg 2 to 3 some days Fruit 2 a day    Exercise Very little now; torn achilles tendon in right foot and will act up and can hurt for 9 months  May try to start back walking this spring and recommended she build time slowly  Loves to [place] but this is the first year she will not have a garden. Doesn't have the energy to keep the garden up   Falls none;  Has one level home Bathroom; tub with shower;  Need a bar of safety  Suggested Home solutions from [employer] which is an organization that can put bars up if she needs someone to do this   Stressors;  Spouse thought to have early dementia Will be seeing MD but has not made an apt as yet  Would like for him to have AWV; will schedule  Advanced Directive; completed May bring Dr. Yong Channel a copy     Cardiac Risk Factors include: advanced age (>108men, >71  women);hypertension     Objective:     Vitals: BP 140/90   Pulse 61   Ht 5\' 6"  (1.676 m)   Wt 163 lb (73.9 kg)   SpO2 95%   BMI 26.31 kg/m   Body mass index is 26.31 kg/m.   Tobacco History  Smoking Status  . Former Smoker  . Packs/day: 2.00  . Years: 13.00  . Types: Cigarettes  . Quit date: 03/26/1968  Smokeless Tobacco  . Never Used    Comment: Quit in 1969 - smokeed 2-3PPD , was 3 PPD at her heaviest for about 3 years.      Counseling given: Yes   Past Medical History:  Diagnosis Date  . Achilles tendon rupture   . Arthritis of hand, right   . Carpal tunnel syndrome 09/04/2013  . Collagenous colitis 2005  . Colon polyps 2010   Tubular adenoma and hyperplastic  . Dental infection 10/25/2014  . Diverticulosis   . Esophageal stenosis   . Essential tremor   . GERD (gastroesophageal reflux disease)    hx hiatal hernia, hx esophagitis, hx stricture  . Heart murmur   . Hiatal hernia   . Hx: UTI (urinary tract infection)   . Hypertension   . Lower extremity neuropathy 08/23/2013   On B12 therapy with numbness in the feet bilaterally  no evidence of diabetes   . Ocular migraine    jagged vision, a few per month  . OSA (obstructive sleep apnea) 12/21/2016  . Peripheral neuropathy ([place])    treated by Dr Posey Pronto (07/2015)   Past Surgical History:  Procedure Laterality Date  . APPENDECTOMY    . CARPAL TUNNEL RELEASE    . CATARACT EXTRACTION    . EXCISION MORTON'S NEUROMA     Right foot  . INTRAOCULAR LENS IMPLANT, SECONDARY    . Moles removed    . MOUTH SURGERY     (For Exostosis)  . ROTATOR CUFF REPAIR    . TONSILLECTOMY     Family History  Problem Relation Age of Onset  . Rectal cancer Father     colostomy  . Heart failure Father     Died, 89  . Other Mother     Died, 57. old age.   . Barrett's esophagus Son   . Colon cancer Neg Hx   . Esophageal cancer Neg Hx   . Stomach cancer Neg Hx    History  Sexual Activity  . Sexual activity: Yes     Outpatient Encounter Prescriptions as of 03/09/2017  Medication Sig  . Cholecalciferol (VITAMIN D3) 1000 units CAPS Take 1 capsule by mouth daily.  . Multiple Vitamins-Minerals (ICAPS AREDS 2) CAPS Take 1 capsule by mouth daily.  . nadolol-bendroflumethiazide (CORZIDE) 40-5 MG tablet Take 1 tablet by mouth daily.  . ranitidine (ZANTAC) 300 MG capsule Take 1 capsule (300 mg total) by mouth 2 (two) times daily.  . Turmeric 500 MG CAPS Take 1 capsule by mouth daily.  . Multiple Vitamins-Minerals (CENTRUM SILVER 50+WOMEN) TABS Take 1 tablet by mouth.   No facility-administered encounter medications on file as of 03/09/2017.     Activities of Daily Living In your present state of health, do you have any difficulty performing the following activities: 03/09/2017  Hearing? N  Vision? N  Difficulty concentrating or making decisions? N  Walking or climbing stairs? N  Dressing or bathing? N  Doing errands, shopping? N  Preparing Food and eating ? N  Using the Toilet? N  In the past six months, have you accidently leaked urine? N  Do you have problems with loss of bowel control? N  Managing your Medications? N  Managing your Finances? N  Housekeeping or managing your Housekeeping? N  Some recent data might be hidden    Patient Care Team: Marin Olp, MD as PCP - General (Family Medicine) Alda Berthold, DO as Consulting Physician (Neurology) Lafayette Dragon, MD (Inactive) as Consulting Physician (Gastroenterology) Crista Luria, MD as Consulting Physician (Dermatology) Shon Hough, MD as Consulting Physician (Ophthalmology)    Assessment:   Exercise Activities and Dietary recommendations Current Exercise Habits: Home exercise routine, Time (Minutes): 30, Intensity: Mild  Goals    . Weight (lb) < 150 lb (68 kg)          Think about what type of exercise may be helpful        Fall Risk Fall Risk  03/09/2017 09/12/2016 08/10/2016 08/05/2015 08/05/2015  Falls in the past  year? No No No No No   Depression Screen PHQ 2/9 Scores 03/09/2017 09/12/2016 08/05/2015 08/05/2015  PHQ - 2 Score 0 0 0 0     Cognitive Function No issues; Ad8 score reviewed for issues:  Issues making decisions:  Less interest in hobbies / activities:  Repeats questions, stories (family complaining):  Trouble using ordinary gadgets (microwave,  computer, phone):  Forgets the month or year:   Mismanaging finances:   Remembering appts:  Daily problems with thinking and/or memory: Ad8 score is= 0         Immunization History  Administered Date(s) Administered  . Influenza Split 12/01/2011, 11/05/2012  . Influenza Whole 01/07/2010, 10/08/2010  . Influenza, High Dose Seasonal PF 10/01/2014, 11/10/2015, 10/25/2016  . Influenza,inj,Quad PF,36+ Mos 08/23/2013  . Pneumococcal Conjugate-13 04/13/2015  . Pneumococcal Polysaccharide-23 12/26/2005  . Td 12/26/2001  . Tdap 01/23/2012  . Zoster 02/08/2010   Screening Tests Health Maintenance  Topic Date Due  . TETANUS/TDAP  01/22/2022  . INFLUENZA VACCINE  Completed  . DEXA SCAN  Completed  . PNA vac Low Risk Adult  Completed      Plan:     Will see Dr. Yong Channel next week  No preventive health  Referrals; already discussed seeing GYN: has someone in mind and can discuss future mammograms with this md.  Screens normal  Occular migraines continue in which she loses central vision and then starts slowly to get it back; no actual pain Was seeing Dr. Kathrin Penner; beginning MD     During the course of the visit the patient was educated and counseled about the following appropriate screening and preventive services:   Vaccines to include Pneumoccal, Influenza, Hepatitis B, Td, Zostavax, HCV  Electrocardiogram  Cardiovascular Disease / BP rechecked at 138 / 70  Colorectal cancer screening 09/2014 repeat 09/2019  Bone density screening -1.3 and educated on strength building exercise   Diabetes screening  Glaucoma  screening neg for now  Mammography/not since 2012   Nutrition counseling - watches diet and eats well; Would like to lose weight although her BMI is normal for her age   Patient Instructions (the written plan) was given to the patient.   [email or web address],MOQHU, RN  03/09/2017  Addend: the patient brought in copy of her last dexa dated 09/21/2015 T score was -2.3  Continue intake of calcium 1200mg  and Vit d; weight bearing exercise Will forward report to Dr. Yong Channel for visit scheduled Monday 03/19 [facility] RN

## 2017-03-09 NOTE — Telephone Encounter (Signed)
In for AWV HRA was scanned into the epic

## 2017-03-09 NOTE — Patient Instructions (Addendum)
Adriana Hendricks , Thank you for taking time to come for your Medicare Wellness Visit. I appreciate your ongoing commitment to your health goals. Please review the following plan we discussed and let me know if I can assist you in the future.   Please bring Advanced Directive to dr. Yong Channel for review and we can scan to your medical record   May make apt with any GYN; may see for a few urinary issues   Be sure to get 1200 of calcium;  Some type of strength bearing exercise of your choice    [facility]; [phone] [address] [place]; [phone] Get resource to get information on any and all community programs for Seniors  [place]: [phone] [facility] -[phone] Public Health Dept; Need to be a skilled visit but can assist with bathing as well; [phone]  [facility] - can assist with hearing aid x 1  No reviews  [facility]  [phone]  [phone]    These are the goals we discussed: Goals    . Weight (lb) < 150 lb (68 kg)          Think about what type of exercise may be helpful         This is a list of the screening recommended for you and due dates:  Health Maintenance  Topic Date Due  . Tetanus Vaccine  01/22/2022  . Flu Shot  Completed  . DEXA scan (bone density measurement)  Completed  . Pneumonia vaccines  Completed        Fall Prevention in the Home Falls can cause injuries. They can happen to people of all ages. There are many things you can do to make your home safe and to help prevent falls. What can I do on the outside of my home?  Regularly fix the edges of walkways and driveways and fix any cracks.  Remove anything that might make you trip as you walk through a door, such as a raised step or threshold.  Trim any bushes or trees on the path to your home.  Use bright outdoor lighting.  Clear any walking paths of anything that might make someone trip, such as rocks  or tools.  Regularly check to see if handrails are loose or broken. Make sure that both sides of any steps have handrails.  Any raised decks and porches should have guardrails on the edges.  Have any leaves, snow, or ice cleared regularly.  Use sand or salt on walking paths during winter.  Clean up any spills in your garage right away. This includes oil or grease spills. What can I do in the bathroom?  Use night lights.  Install grab bars by the toilet and in the tub and shower. Do not use towel bars as grab bars.  Use non-skid mats or decals in the tub or shower.  If you need to sit down in the shower, use a plastic, non-slip stool.  Keep the floor dry. Clean up any water that spills on the floor as soon as it happens.  Remove soap buildup in the tub or shower regularly.  Attach bath mats securely with double-sided non-slip rug tape.  Do not have throw rugs and other things on the floor that can make you trip. What can I do in the bedroom?  Use night lights.  Make sure that you have a light by your bed that is easy to reach.  Do not use any sheets or blankets that are too big for your bed. They should not hang down onto the floor.  Have a firm chair that has side arms. You can use this for support while you get dressed.  Do not have throw rugs and other things on the floor that can make you trip. What can I do in the kitchen?  Clean up any spills right away.  Avoid walking on wet floors.  Keep items that you use a lot in easy-to-reach places.  If you need to reach something above you, use a strong step stool that has a grab bar.  Keep electrical cords out of the way.  Do not use floor polish or wax that makes floors slippery. If you must use wax, use non-skid floor wax.  Do not have throw rugs and other things on the floor that can make you trip. What can I do with my stairs?  Do not leave any items on the stairs.  Make sure that there are handrails on both  sides of the stairs and use them. Fix handrails that are broken or loose. Make sure that handrails are as long as the stairways.  Check any carpeting to make sure that it is firmly attached to the stairs. Fix any carpet that is loose or worn.  Avoid having throw rugs at the top or bottom of the stairs. If you do have throw rugs, attach them to the floor with carpet tape.  Make sure that you have a light switch at the top of the stairs and the bottom of the stairs. If you do not have them, ask someone to add them for you. What else can I do to help prevent falls?  Wear shoes that:  Do not have high heels.  Have rubber bottoms.  Are comfortable and fit you well.  Are closed at the toe. Do not wear sandals.  If you use a stepladder:  Make sure that it is fully opened. Do not climb a closed stepladder.  Make sure that both sides of the stepladder are locked into place.  Ask someone to hold it for you, if possible.  Clearly mark and make sure that you can see:  Any grab bars or handrails.  First and last steps.  Where the edge of each step is.  Use tools that help you move around (mobility aids) if they are needed. These include:  Canes.  Walkers.  Scooters.  Crutches.  Turn on the lights when you go into a dark area. Replace any light bulbs as soon as they burn out.  Set up your furniture so you have a clear path. Avoid moving your furniture around.  If any of your floors are uneven, fix them.  If there are any pets around you, be aware of where they are.  Review your medicines with your doctor. Some medicines can make you feel dizzy. This can increase your chance of falling. Ask your doctor what other things that you can do to help prevent falls. This information is not intended to replace advice given to you by your health care provider. Make sure you discuss any questions you have with your health care provider. Document Released: 10/08/2009 Document Revised:  05/19/2016 Document Reviewed: 01/16/2015 Elsevier Interactive Patient Education  2017 [place] Maintenance, Female Adopting a healthy lifestyle and getting preventive care can go a long way to promote health and wellness. Talk with your health care provider about what schedule of  regular examinations is right for you. This is a good chance for you to check in with your provider about disease prevention and staying healthy. In between checkups, there are plenty of things you can do on your own. Experts have done a lot of research about which lifestyle changes and preventive measures are most likely to keep you healthy. Ask your health care provider for more information. Weight and diet Eat a healthy diet  Be sure to include plenty of vegetables, fruits, low-fat dairy products, and lean protein.  Do not eat a lot of foods high in solid fats, added sugars, or salt.  Get regular exercise. This is one of the most important things you can do for your health.  Most adults should exercise for at least 150 minutes each week. The exercise should increase your heart rate and make you sweat (moderate-intensity exercise).  Most adults should also do strengthening exercises at least twice a week. This is in addition to the moderate-intensity exercise. Maintain a healthy weight  Body mass index (BMI) is a measurement that can be used to identify possible weight problems. It estimates body fat based on height and weight. Your health care provider can help determine your BMI and help you achieve or maintain a healthy weight.  For females 39 years of age and older:  A BMI below 18.5 is considered underweight.  A BMI of 18.5 to 24.9 is normal.  A BMI of 25 to 29.9 is considered overweight.  A BMI of 30 and above is considered obese. Watch levels of cholesterol and blood lipids  You should start having your blood tested for lipids and cholesterol at 80 years of age, then have this test  every 5 years.  You may need to have your cholesterol levels checked more often if:  Your lipid or cholesterol levels are high.  You are older than 80 years of age.  You are at high risk for heart disease. Cancer screening Lung Cancer  Lung cancer screening is recommended for adults 3-39 years old who are at high risk for lung cancer because of a history of smoking.  A yearly low-dose CT scan of the lungs is recommended for people who:  Currently smoke.  Have quit within the past 15 years.  Have at least a 30-pack-year history of smoking. A pack year is smoking an average of one pack of cigarettes a day for 1 year.  Yearly screening should continue until it has been 15 years since you quit.  Yearly screening should stop if you develop a health problem that would prevent you from having lung cancer treatment. Breast Cancer  Practice breast self-awareness. This means understanding how your breasts normally appear and feel.  It also means doing regular breast self-exams. Let your health care provider know about any changes, no matter how small.  If you are in your 20s or 30s, you should have a clinical breast exam (CBE) by a health care provider every 1-3 years as part of a regular health exam.  If you are 1 or older, have a CBE every year. Also consider having a breast X-ray (mammogram) every year.  If you have a family history of breast cancer, talk to your health care provider about genetic screening.  If you are at high risk for breast cancer, talk to your health care provider about having an MRI and a mammogram every year.  Breast cancer gene (BRCA) assessment is recommended for women who have family members with BRCA-related cancers.  BRCA-related cancers include:  Breast.  Ovarian.  Tubal.  Peritoneal cancers.  Results of the assessment will determine the need for genetic counseling and BRCA1 and BRCA2 testing. Cervical Cancer  Your health care provider may  recommend that you be screened regularly for cancer of the pelvic organs (ovaries, uterus, and vagina). This screening involves a pelvic examination, including checking for microscopic changes to the surface of your cervix (Pap test). You may be encouraged to have this screening done every 3 years, beginning at age 23.  For women ages 42-65, health care providers may recommend pelvic exams and Pap testing every 3 years, or they may recommend the Pap and pelvic exam, combined with testing for human papilloma virus (HPV), every 5 years. Some types of HPV increase your risk of cervical cancer. Testing for HPV may also be done on women of any age with unclear Pap test results.  Other health care providers may not recommend any screening for nonpregnant women who are considered low risk for pelvic cancer and who do not have symptoms. Ask your health care provider if a screening pelvic exam is right for you.  If you have had past treatment for cervical cancer or a condition that could lead to cancer, you need Pap tests and screening for cancer for at least 20 years after your treatment. If Pap tests have been discontinued, your risk factors (such as having a new sexual partner) need to be reassessed to determine if screening should resume. Some women have medical problems that increase the chance of getting cervical cancer. In these cases, your health care provider may recommend more frequent screening and Pap tests. Colorectal Cancer  This type of cancer can be detected and often prevented.  Routine colorectal cancer screening usually begins at 80 years of age and continues through 80 years of age.  Your health care provider may recommend screening at an earlier age if you have risk factors for colon cancer.  Your health care provider may also recommend using home test kits to check for hidden blood in the stool.  A small camera at the end of a tube can be used to examine your colon directly  (sigmoidoscopy or colonoscopy). This is done to check for the earliest forms of colorectal cancer.  Routine screening usually begins at age 48.  Direct examination of the colon should be repeated every 5-10 years through 80 years of age. However, you may need to be screened more often if early forms of precancerous polyps or small growths are found. Skin Cancer  Check your skin from head to toe regularly.  Tell your health care provider about any new moles or changes in moles, especially if there is a change in a mole's shape or color.  Also tell your health care provider if you have a mole that is larger than the size of a pencil eraser.  Always use sunscreen. Apply sunscreen liberally and repeatedly throughout the day.  Protect yourself by wearing long sleeves, pants, a wide-brimmed hat, and sunglasses whenever you are outside. Heart disease, diabetes, and high blood pressure  High blood pressure causes heart disease and increases the risk of stroke. High blood pressure is more likely to develop in:  People who have blood pressure in the high end of the normal range (130-139/85-89 mm Hg).  People who are overweight or obese.  People who are African American.  If you are 35-26 years of age, have your blood pressure checked every 3-5 years. If you are  79 years of age or older, have your blood pressure checked every year. You should have your blood pressure measured twice-once when you are at a hospital or clinic, and once when you are not at a hospital or clinic. Record the average of the two measurements. To check your blood pressure when you are not at a hospital or clinic, you can use:  An automated blood pressure machine at a pharmacy.  A home blood pressure monitor.  If you are between 75 years and 77 years old, ask your health care provider if you should take aspirin to prevent strokes.  Have regular diabetes screenings. This involves taking a blood sample to check your  fasting blood sugar level.  If you are at a normal weight and have a low risk for diabetes, have this test once every three years after 80 years of age.  If you are overweight and have a high risk for diabetes, consider being tested at a younger age or more often. Preventing infection Hepatitis B  If you have a higher risk for hepatitis B, you should be screened for this virus. You are considered at high risk for hepatitis B if:  You were born in a country where hepatitis B is common. Ask your health care provider which countries are considered high risk.  Your parents were born in a high-risk country, and you have not been immunized against hepatitis B (hepatitis B vaccine).  You have HIV or AIDS.  You use needles to inject street drugs.  You live with someone who has hepatitis B.  You have had sex with someone who has hepatitis B.  You get hemodialysis treatment.  You take certain medicines for conditions, including cancer, organ transplantation, and autoimmune conditions. Hepatitis C  Blood testing is recommended for:  Everyone born from [birth year] through 1963.  Anyone with known risk factors for hepatitis C. Sexually transmitted infections (STIs)  You should be screened for sexually transmitted infections (STIs) including gonorrhea and chlamydia if:  You are sexually active and are younger than 80 years of age.  You are older than 80 years of age and your health care provider tells you that you are at risk for this type of infection.  Your sexual activity has changed since you were last screened and you are at an increased risk for chlamydia or gonorrhea. Ask your health care provider if you are at risk.  If you do not have HIV, but are at risk, it may be recommended that you take a prescription medicine daily to prevent HIV infection. This is called pre-exposure prophylaxis (PrEP). You are considered at risk if:  You are sexually active and do not regularly use condoms or  know the HIV status of your partner(s).  You take drugs by injection.  You are sexually active with a partner who has HIV. Talk with your health care provider about whether you are at high risk of being infected with HIV. If you choose to begin PrEP, you should first be tested for HIV. You should then be tested every 3 months for as long as you are taking PrEP. Pregnancy  If you are premenopausal and you may become pregnant, ask your health care provider about preconception counseling.  If you may become pregnant, take 400 to 800 micrograms (mcg) of folic acid every day.  If you want to prevent pregnancy, talk to your health care provider about birth control (contraception). Osteoporosis and menopause  Osteoporosis is a disease in which the bones  lose minerals and strength with aging. This can result in serious bone fractures. Your risk for osteoporosis can be identified using a bone density scan.  If you are 76 years of age or older, or if you are at risk for osteoporosis and fractures, ask your health care provider if you should be screened.  Ask your health care provider whether you should take a calcium or vitamin D supplement to lower your risk for osteoporosis.  Menopause may have certain physical symptoms and risks.  Hormone replacement therapy may reduce some of these symptoms and risks. Talk to your health care provider about whether hormone replacement therapy is right for you. Follow these instructions at home:  Schedule regular health, dental, and eye exams.  Stay current with your immunizations.  Do not use any tobacco products including cigarettes, chewing tobacco, or electronic cigarettes.  If you are pregnant, do not drink alcohol.  If you are breastfeeding, limit how much and how often you drink alcohol.  Limit alcohol intake to no more than 1 drink per day for nonpregnant women. One drink equals 12 ounces of beer, 5 ounces of wine, or 1 ounces of hard  liquor.  Do not use street drugs.  Do not share needles.  Ask your health care provider for help if you need support or information about quitting drugs.  Tell your health care provider if you often feel depressed.  Tell your health care provider if you have ever been abused or do not feel safe at home. This information is not intended to replace advice given to you by your health care provider. Make sure you discuss any questions you have with your health care provider. Document Released: 06/27/2011 Document Revised: 05/19/2016 Document Reviewed: 09/15/2015 Elsevier Interactive Patient Education  2017 [employer]   Hearing Loss Hearing loss is a partial or total loss of the ability to hear. This can be temporary or permanent, and it can happen in one or both ears. Hearing loss may be referred to as deafness. Medical care is necessary to treat hearing loss properly and to prevent the condition from getting worse. Your hearing may partially or completely come back, depending on what caused your hearing loss and how severe it is. In some cases, hearing loss is permanent. What are the causes? Common causes of hearing loss include:  Too much wax in the ear canal.  Infection of the ear canal or middle ear.  Fluid in the middle ear.  Injury to the ear or surrounding area.  An object stuck in the ear.  Prolonged exposure to loud sounds, such as music. Less common causes of hearing loss include:  Tumors in the ear.  Viral or bacterial infections, such as meningitis.  A hole in the eardrum (perforated eardrum).  Problems with the hearing nerve that sends signals between the brain and the ear.  Certain medicines. What are the signs or symptoms? Symptoms of this condition may include:  Difficulty telling the difference between sounds.  Difficulty following a conversation when there is background noise.  Lack of response to sounds in your environment. This may be most  noticeable when you do not respond to startling sounds.  Needing to turn up the volume on the television, radio, etc.  Ringing in the ears.  Dizziness.  Pain in the ears. How is this diagnosed? This condition is diagnosed based on a physical exam and a hearing test (audiometry). The audiometry test will be performed by a hearing specialist (audiologist). You may  also be referred to an ear, nose, and throat (ENT) specialist (otolaryngologist). How is this treated? Treatment for recent onset of hearing loss may include:  Ear wax removal.  Being prescribed medicines to prevent infection (antibiotics).  Being prescribed medicines to reduce inflammation (corticosteroids). Follow these instructions at home:  If you were prescribed an antibiotic medicine, take it as told by your health care provider. Do not stop taking the antibiotic even if you start to feel better.  Take over-the-counter and prescription medicines only as told by your health care provider.  Avoid loud noises.  Return to your normal activities as told by your health care provider. Ask your health care provider what activities are safe for you.  Keep all follow-up visits as told by your health care provider. This is important. Contact a health care provider if:  You feel dizzy.  You develop new symptoms.  You vomit or feel nauseous.  You have a fever. Get help right away if:  You develop sudden changes in your vision.  You have severe ear pain.  You have new or increased weakness.  You have a severe headache. This information is not intended to replace advice given to you by your health care provider. Make sure you discuss any questions you have with your health care provider. Document Released: 12/12/2005 Document Revised: 05/19/2016 Document Reviewed: 04/29/2015 Elsevier Interactive Patient Education  2017 [employer].

## 2017-03-13 ENCOUNTER — Ambulatory Visit (INDEPENDENT_AMBULATORY_CARE_PROVIDER_SITE_OTHER): Payer: Medicare Other | Admitting: Family Medicine

## 2017-03-13 ENCOUNTER — Encounter: Payer: Self-pay | Admitting: Family Medicine

## 2017-03-13 DIAGNOSIS — M85852 Other specified disorders of bone density and structure, left thigh: Secondary | ICD-10-CM | POA: Diagnosis not present

## 2017-03-13 DIAGNOSIS — G5793 Unspecified mononeuropathy of bilateral lower limbs: Secondary | ICD-10-CM

## 2017-03-13 DIAGNOSIS — M8580 Other specified disorders of bone density and structure, unspecified site: Secondary | ICD-10-CM | POA: Insufficient documentation

## 2017-03-13 DIAGNOSIS — G43109 Migraine with aura, not intractable, without status migrainosus: Secondary | ICD-10-CM | POA: Diagnosis not present

## 2017-03-13 DIAGNOSIS — I10 Essential (primary) hypertension: Secondary | ICD-10-CM

## 2017-03-13 DIAGNOSIS — E785 Hyperlipidemia, unspecified: Secondary | ICD-10-CM | POA: Diagnosis not present

## 2017-03-13 DIAGNOSIS — K219 Gastro-esophageal reflux disease without esophagitis: Secondary | ICD-10-CM

## 2017-03-13 NOTE — Assessment & Plan Note (Signed)
Followed by neurology in past. She states after august she had recurrence. Advised return visit. Mainly with bright lights

## 2017-03-13 NOTE — Assessment & Plan Note (Signed)
S: prilosec in the past. Now tolerating ranitidine 300mg  in the evening. History of stricture.  A/P: She is going to continue th 300 in the evening- if does not do well and has recurrent stricture may increase to twice a day

## 2017-03-13 NOTE — Assessment & Plan Note (Signed)
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  A/P: we discussed since 18 months in- despite worsening from 2013- we would monitor by rechecking in September and if continues to worsen now that she is on consistent calcium/vitamin D we would consider treatment with fosamax given elevated fracture risks.

## 2017-03-13 NOTE — Assessment & Plan Note (Signed)
S: controlled on corzide.  BP Readings from Last 3 Encounters:  03/13/17 136/70  03/09/17 138/70  02/15/17 132/70  A/P:Continue current meds:  Continue current medications

## 2017-03-13 NOTE — Patient Instructions (Addendum)
Schedule a lab visit at the check out desk within 2 weeks. Return for future fasting labs meaning nothing but water after midnight please. Ok to take your medications with water.   Follow up 6 months and we will repeat bone density- or could call [facility] and make sure they send me a copy  No other changes

## 2017-03-13 NOTE — Progress Notes (Signed)
Subjective:  Adriana Hendricks is a 82 y.o. year old very pleasant female patient who presents for/with See problem oriented charting ROS- No chest pain or shortness of breath. No headache or blurry vision.  No recent fractures.    Past Medical History-  Patient Active Problem List   Diagnosis Date Noted  . Lower extremity neuropathy 08/23/2013    Priority: Medium  . Hyperlipemia 01/07/2010    Priority: Medium  . GASTROESOPHAGEAL REFLUX DISEASE 06/19/2009    Priority: Medium  . Essential tremor 11/16/2007    Priority: Medium  . Essential hypertension 11/16/2007    Priority: Medium  . IBS (irritable bowel syndrome) 09/12/2016    Priority: Low  . History of adenomatous polyp of colon 09/12/2016    Priority: Low  . Former smoker 09/12/2016    Priority: Low  . Carpal tunnel syndrome 09/04/2013    Priority: Low  . Achilles tendon tear 01/17/2011    Priority: Low  . ACNE ROSACEA 04/03/2008    Priority: Low  . OSA (obstructive sleep apnea) 12/21/2016    Medications- reviewed and updated Current Outpatient Prescriptions  Medication Sig Dispense Refill  . Cholecalciferol (VITAMIN D3) 1000 units CAPS Take 1 capsule by mouth daily.    . Multiple Vitamins-Minerals (ICAPS AREDS 2) CAPS Take 1 capsule by mouth daily.    . nadolol-bendroflumethiazide (CORZIDE) 40-5 MG tablet Take 1 tablet by mouth daily. 90 tablet 3  . ranitidine (ZANTAC) 300 MG capsule Take 1 capsule (300 mg total) by mouth 2 (two) times daily. (Patient taking differently: Take 300 mg by mouth every evening. ) 180 capsule 3  . Turmeric 500 MG CAPS Take 1 capsule by mouth daily.     No current facility-administered medications for this visit.     Objective: BP 136/70 (BP Location: Left Arm, Patient Position: Sitting, Cuff Size: Normal)   Pulse (!) 58   Temp 98 F (36.7 C) (Oral)   Ht 5\' 6"  (1.676 m)   Wt 165 lb 3.2 oz (74.9 kg)   SpO2 97%   BMI 26.66 kg/m  Gen: NAD, resting comfortably CV: RRR no murmurs rubs or  gallops Lungs: CTAB no crackles, wheeze, rhonchi Abdomen: soft/nontender/nondistended/normal bowel sounds. overweight Ext: no edema Skin: warm, dry  Assessment/Plan:  Osteopenia S: osteopenia with left radius worst t score -2.3 in September 2016.  A/P: we discussed since 18 months in- despite worsening from 2013- we would monitor by rechecking in September and if continues to worsen now that she is on consistent calcium/vitamin D we would consider treatment with fosamax given elevated fracture risks.   Essential hypertension S: controlled on corzide.  BP Readings from Last 3 Encounters:  03/13/17 136/70  03/09/17 138/70  02/15/17 132/70  A/P:Continue current meds:  Continue current medications  GASTROESOPHAGEAL REFLUX DISEASE S: prilosec in the past. Now tolerating ranitidine 300mg  in the evening. History of stricture.  A/P: She is going to continue th 300 in the evening- if does not do well and has recurrent stricture may increase to twice a day  Hyperlipemia S: mild controlled on no rx on last check. No myalgias.   A/P: update lipids (return for this)- will calculate 10 year risk ascvd  Ocular migraine Followed by neurology in past. She states after august she had recurrence. Advised return visit. Mainly with bright lights  Return in about 6 months (around 09/13/2017) for follow up- or sooner if needed.  Orders Placed This Encounter  Procedures  . HM DEXA SCAN  This external order was created through the Results Console.   Return precautions advised.  Garret Reddish, MD

## 2017-03-13 NOTE — Assessment & Plan Note (Signed)
S: mild controlled on no rx on last check. No myalgias.   A/P: update lipids (return for this)- will calculate 10 year risk ascvd

## 2017-03-13 NOTE — Progress Notes (Signed)
Pre visit review using our clinic review tool, if applicable. No additional management support is needed unless otherwise documented below in the visit note. 

## 2017-03-14 ENCOUNTER — Other Ambulatory Visit (INDEPENDENT_AMBULATORY_CARE_PROVIDER_SITE_OTHER): Payer: Medicare Other

## 2017-03-14 ENCOUNTER — Encounter: Payer: Self-pay | Admitting: Adult Health

## 2017-03-14 ENCOUNTER — Ambulatory Visit (INDEPENDENT_AMBULATORY_CARE_PROVIDER_SITE_OTHER): Payer: Medicare Other | Admitting: Adult Health

## 2017-03-14 DIAGNOSIS — G4733 Obstructive sleep apnea (adult) (pediatric): Secondary | ICD-10-CM | POA: Diagnosis not present

## 2017-03-14 DIAGNOSIS — E785 Hyperlipidemia, unspecified: Secondary | ICD-10-CM | POA: Diagnosis not present

## 2017-03-14 DIAGNOSIS — M85852 Other specified disorders of bone density and structure, left thigh: Secondary | ICD-10-CM | POA: Diagnosis not present

## 2017-03-14 LAB — COMPREHENSIVE METABOLIC PANEL
ALBUMIN: 4.3 g/dL (ref 3.5–5.2)
ALK PHOS: 66 U/L (ref 39–117)
ALT: 10 U/L (ref 0–35)
AST: 13 U/L (ref 0–37)
BUN: 16 mg/dL (ref 6–23)
CHLORIDE: 93 meq/L — AB (ref 96–112)
CO2: 31 mEq/L (ref 19–32)
Calcium: 9.7 mg/dL (ref 8.4–10.5)
Creatinine, Ser: 0.62 mg/dL (ref 0.40–1.20)
GFR: 98.52 mL/min (ref >60.00)
Glucose, Bld: 83 mg/dL (ref 70–99)
POTASSIUM: 4 meq/L (ref 3.5–5.1)
Sodium: 132 mEq/L — ABNORMAL LOW (ref 135–145)
Total Bilirubin: 1.2 mg/dL (ref 0.2–1.2)
Total Protein: 6.8 g/dL (ref 6.0–8.3)

## 2017-03-14 LAB — CBC
HEMATOCRIT: 43.2 % (ref 36.0–46.0)
HEMOGLOBIN: 14.9 g/dL (ref 12.0–15.0)
MCHC: 34.5 g/dL (ref 30.0–36.0)
MCV: 92.9 fl (ref 78.0–100.0)
Platelets: 180 10*3/uL (ref 150.0–400.0)
RBC: 4.65 Mil/uL (ref 3.87–5.11)
RDW: 13.1 % (ref 11.5–15.5)
WBC: 7.5 10*3/uL (ref 4.0–10.5)

## 2017-03-14 LAB — LIPID PANEL
CHOLESTEROL: 192 mg/dL (ref 0–200)
HDL: 62 mg/dL (ref >39.00)
LDL Cholesterol: 111 mg/dL — ABNORMAL HIGH (ref 0–99)
NonHDL: 130.15
TRIGLYCERIDES: 94 mg/dL (ref 0.0–149.0)
Total CHOL/HDL Ratio: 3
VLDL: 18.8 mg/dL (ref 0.0–40.0)

## 2017-03-14 LAB — VITAMIN D 25 HYDROXY (VIT D DEFICIENCY, FRACTURES): VITD: 33.01 ng/mL (ref 30.00–100.00)

## 2017-03-14 NOTE — Patient Instructions (Addendum)
Continue on CPAP .  Change to Dream Wear Gell pillows mask . w/ chin strap.  Goal is to wear for at least 4hr each night  Do not drive if sleepy .  Follow up with Dr. Halford Chessman  In 4-6  months and As needed

## 2017-03-14 NOTE — Progress Notes (Signed)
@Patient  ID: Adriana Hendricks, female    DOB: 06/06/1935, 82 y.o.   MRN: [id number]  Chief Complaint  Patient presents with  . Follow-up    OSA    Referring provider: Marin Olp, MD  HPI: 82 year old female seen for a sleep consult 11/07/2016 for daytime sleepiness and snoring. Found to have moderate sleep apnea  TEST  HST 12/14/16 >> AHI 19.5, SaO2 low 78%  03/14/2017 Follow up : OSA  Pt returns for a follow up for OSA . She Recently started on C Pap for moderate sleep apnea. She was changed to a nasal mass. Last visit. She felt that her full face mask. Sleep in. She does like a new nasal mask but is irritating the top of her nose. Low shows excellent compliance with average usage is 6.5 hours. She is on AutoSet 5-15 cm H2O. AHI is 2.6. Positive leaks. Discussed changing to nasal pillows. To see if that would help.     Allergies  Allergen Reactions  . Clarithromycin     REACTION: nausea  . Erythromycin Base Nausea And Vomiting  . Levofloxacin     Patient has a history of a Achilles tendon tear and developed tendon pain while on medication  . Metronidazole     REACTION: Rash  . Penicillins     Rash     Immunization History  Administered Date(s) Administered  . Influenza Split 12/01/2011, 11/05/2012  . Influenza Whole 01/07/2010, 10/08/2010  . Influenza, High Dose Seasonal PF 10/01/2014, 11/10/2015, 10/25/2016  . Influenza,inj,Quad PF,36+ Mos 08/23/2013  . Pneumococcal Conjugate-13 04/13/2015  . Pneumococcal Polysaccharide-23 12/26/2005  . Td 12/26/2001  . Tdap 01/23/2012  . Zoster 02/08/2010    Past Medical History:  Diagnosis Date  . Achilles tendon rupture   . Arthritis of hand, right   . Carpal tunnel syndrome 09/04/2013  . Collagenous colitis 2005  . Colon polyps 2010   Tubular adenoma and hyperplastic  . Dental infection 10/25/2014  . Diverticulosis   . Esophageal stenosis   . Essential tremor   . GERD (gastroesophageal reflux disease)    hx  hiatal hernia, hx esophagitis, hx stricture  . Heart murmur   . Hiatal hernia   . Hx: UTI (urinary tract infection)   . Hypertension   . Lower extremity neuropathy 08/23/2013   On B12 therapy with numbness in the feet bilaterally no evidence of diabetes   . Ocular migraine    jagged vision, a few per month  . OSA (obstructive sleep apnea) 12/21/2016  . Peripheral neuropathy ([place])    treated by Dr Posey Pronto (07/2015)    Tobacco History: History  Smoking Status  . Former Smoker  . Packs/day: 2.00  . Years: 13.00  . Types: Cigarettes  . Start date: 12/26/1954  . Quit date: 03/26/1968  Smokeless Tobacco  . Never Used    Comment: Quit in 1969 - smokeed 2-3PPD , was 3 PPD at her heaviest for about 3 years.    Counseling given: Not Answered   Outpatient Encounter Prescriptions as of 03/14/2017  Medication Sig  . aspirin EC 81 MG tablet Take 81 mg by mouth daily.  . calcium carbonate (TUMS - DOSED IN MG ELEMENTAL CALCIUM) 500 MG chewable tablet Chew 2 tablets by mouth daily.  . Cholecalciferol (VITAMIN D3) 1000 units CAPS Take 1 capsule by mouth daily.  . Multiple Vitamins-Minerals (ICAPS AREDS 2) CAPS Take 1 capsule by mouth daily.  . nadolol-bendroflumethiazide (CORZIDE) 40-5 MG tablet Take 1 tablet by  mouth daily.  . ranitidine (ZANTAC) 300 MG capsule Take 1 capsule (300 mg total) by mouth 2 (two) times daily. (Patient taking differently: Take 300 mg by mouth every evening. )  . Turmeric 500 MG CAPS Take 1 capsule by mouth daily.   No facility-administered encounter medications on file as of 03/14/2017.      Review of Systems  Constitutional:   No  weight loss, night sweats,  Fevers, chills, fatigue, or  lassitude.  HEENT:   No headaches,  Difficulty swallowing,  Tooth/dental problems, or  Sore throat,                No sneezing, itching, ear ache, nasal congestion, post nasal drip,   CV:  No chest pain,  Orthopnea, PND, swelling in lower extremities, anasarca, dizziness,  palpitations, syncope.   GI  No heartburn, indigestion, abdominal pain, nausea, vomiting, diarrhea, change in bowel habits, loss of appetite, bloody stools.   Resp: No shortness of breath with exertion or at rest.  No excess mucus, no productive cough,  No non-productive cough,  No coughing up of blood.  No change in color of mucus.  No wheezing.  No chest wall deformity  Skin: no rash or lesions.  GU: no dysuria, change in color of urine, no urgency or frequency.  No flank pain, no hematuria   MS:  No joint pain or swelling.  No decreased range of motion.  No back pain.    Physical Exam  BP 124/68 (BP Location: Left Arm, Cuff Size: Normal)   Pulse (!) 54   Ht 5\' 6"  (1.676 m)   Wt 165 lb (74.8 kg)   SpO2 97%   BMI 26.63 kg/m   GEN: A/Ox3; pleasant , NAD, well nourished    HEENT:  [place]/AT,  EACs-clear, TMs-wnl, NOSE-clear, THROAT-clear, no lesions, no postnasal drip or exudate noted. Class 2 MP airway   NECK:  Supple w/ fair ROM; no JVD; normal carotid impulses w/o bruits; no thyromegaly or nodules palpated; no lymphadenopathy.    RESP  Clear  P & A; w/o, wheezes/ rales/ or rhonchi. no accessory muscle use, no dullness to percussion  CARD:  RRR, no m/r/g, no peripheral edema, pulses intact, no cyanosis or clubbing.  GI:   Soft & nt; nml bowel sounds; no organomegaly or masses detected.   Musco: Warm bil, no deformities or joint swelling noted.   Neuro: alert, no focal deficits noted.    Skin: Warm, no lesions or rashes    Lab Results:  CBC   BNP No results found for: BNP  ProBNP No results found for: PROBNP  Imaging: No results found.   Assessment & Plan:   OSA (obstructive sleep apnea) Controlled on CPAP .At bedtime   Change to nasal pillows for comfort   Plan  Patient Instructions  Continue on CPAP .  Change to Dream Wear Gell pillows mask . w/ chin strap.  Goal is to wear for at least 4hr each night  Do not drive if sleepy .  Follow up with Dr.  Halford Chessman  In 4-6  months and As needed         Rexene Edison, NP 03/14/2017

## 2017-03-14 NOTE — Progress Notes (Signed)
I have reviewed and agree with assessment/plan.  Chesley Mires, MD [facility] Pulmonary/Critical Care 03/14/2017, 5:14 PM Pager:  [phone]

## 2017-03-14 NOTE — Assessment & Plan Note (Signed)
Controlled on CPAP .At bedtime   Change to nasal pillows for comfort   Plan  Patient Instructions  Continue on CPAP .  Change to Dream Wear Gell pillows mask . w/ chin strap.  Goal is to wear for at least 4hr each night  Do not drive if sleepy .  Follow up with Dr. Halford Chessman  In 4-6  months and As needed

## 2017-03-14 NOTE — Addendum Note (Signed)
Addended by: Parke Poisson E on: 03/14/2017 04:43 PM   Modules accepted: Orders

## 2017-06-16 ENCOUNTER — Other Ambulatory Visit: Payer: Self-pay | Admitting: Family Medicine

## 2017-06-16 NOTE — Telephone Encounter (Signed)
Shouldn't she have one more refill?

## 2017-07-12 ENCOUNTER — Encounter: Payer: Self-pay | Admitting: Pulmonary Disease

## 2017-07-12 ENCOUNTER — Ambulatory Visit (INDEPENDENT_AMBULATORY_CARE_PROVIDER_SITE_OTHER): Payer: Medicare Other | Admitting: Pulmonary Disease

## 2017-07-12 VITALS — BP 142/76 | HR 54 | Ht 65.0 in | Wt 164.8 lb

## 2017-07-12 DIAGNOSIS — G4733 Obstructive sleep apnea (adult) (pediatric): Secondary | ICD-10-CM | POA: Diagnosis not present

## 2017-07-12 NOTE — Patient Instructions (Signed)
Will change auto CPAP range to 5 - 12 cm H2O  Follow up in 1 year

## 2017-07-12 NOTE — Progress Notes (Signed)
Current Outpatient Prescriptions on File Prior to Visit  Medication Sig  . aspirin EC 81 MG tablet Take 81 mg by mouth daily.  . calcium carbonate (TUMS - DOSED IN MG ELEMENTAL CALCIUM) 500 MG chewable tablet Chew 2 tablets by mouth daily.  . Cholecalciferol (VITAMIN D3) 1000 units CAPS Take 1 capsule by mouth daily.  . Multiple Vitamins-Minerals (ICAPS AREDS 2) CAPS Take 1 capsule by mouth daily.  . nadolol-bendroflumethiazide (CORZIDE) 40-5 MG tablet TAKE 1 TABLET DAILY  . ranitidine (ZANTAC) 300 MG capsule Take 1 capsule (300 mg total) by mouth 2 (two) times daily. (Patient taking differently: Take 300 mg by mouth every evening. )  . Turmeric 500 MG CAPS Take 1 capsule by mouth daily.   No current facility-administered medications on file prior to visit.      Chief Complaint  Patient presents with  . Follow-up    Pt reports improvement since mask change. Denies problems with current mask or pressure. Wears CPAP nightly.  DME: [facility]     Sleep tests HST 12/14/16 >> AHI 19.5, SaO2 low 78% Auto CPAP 06/12/17 to 07/11/17 >> used on 30 of 30 nights with average 7 hrs 27 min.  Average AHI 4.8 with median CPAP 13 and 95 th percentile CPAP 14 cm H2O  Past medical history Collagenous colitis, Esophageal stenosis, Tremor, HTN, Neuropathy  Past surgical history, Family history, Social history, Allergies all reviewed.  Vital Signs BP (!) 142/76 (BP Location: Left Arm, Cuff Size: Normal)   Pulse (!) 54   Ht 5\' 5"  (1.651 m)   Wt 164 lb 12.8 oz (74.8 kg)   SpO2 100%   BMI 27.42 kg/m   History of Present Illness Adriana Hendricks is a 82 y.o. female with obstructive sleep apnea.  She is doing well with CPAP.  Eventually changed to nasal mask.  Gets mouth dryness occasionally.  Had to turn down humidifier.  Feels pressure gets too high at times when her mouth opens.  She has chin strap, but not using.  Planning trip to [place] for granddaughters wedding.    Physical Exam  General - No  distress ENT - No sinus tenderness, no oral exudate, no LAN, MP 3 Cardiac - s1s2 regular, no murmur Chest - No wheeze/rales/dullness Back - No focal tenderness Abd - Soft, non-tender Ext - No edema Neuro - Normal strength Skin - No rashes Psych - normal mood, and behavior   Assessment/Plan  Obstructive sleep apnea. - she is compliant with CPAP and reports benefit - will change auto CPAP range to 5 - 12 cm H2O   Patient Instructions  Will change auto CPAP range to 5 - 12 cm H2O  Follow up in 1 year    Chesley Mires, MD [place] Pulmonary/Critical Care/Sleep Pager:  [phone] 07/12/2017, 9:35 AM

## 2017-09-13 ENCOUNTER — Encounter: Payer: Self-pay | Admitting: Family Medicine

## 2017-09-13 ENCOUNTER — Ambulatory Visit (INDEPENDENT_AMBULATORY_CARE_PROVIDER_SITE_OTHER): Payer: Medicare Other | Admitting: Family Medicine

## 2017-09-13 DIAGNOSIS — E785 Hyperlipidemia, unspecified: Secondary | ICD-10-CM | POA: Diagnosis not present

## 2017-09-13 DIAGNOSIS — M85852 Other specified disorders of bone density and structure, left thigh: Secondary | ICD-10-CM

## 2017-09-13 DIAGNOSIS — I10 Essential (primary) hypertension: Secondary | ICD-10-CM

## 2017-09-13 DIAGNOSIS — K219 Gastro-esophageal reflux disease without esophagitis: Secondary | ICD-10-CM | POA: Diagnosis not present

## 2017-09-13 NOTE — Assessment & Plan Note (Signed)
S: she is compliant with ranitidine 300mg  in evening. History of stricture. Trying to get off PPI due to osteopenia. May change to 150mg  BID if not controlled. January had stretching of esophagus- this is actually the best she has done in some time.  A/P:continue current meds

## 2017-09-13 NOTE — Progress Notes (Signed)
Subjective:  Adriana Hendricks is a 82 y.o. year old very pleasant female patient who presents for/with See problem oriented charting ROS- No chest pain or shortness of breath. No headache or blurry vision.    Past Medical History-  Patient Active Problem List   Diagnosis Date Noted  . Osteopenia 03/13/2017    Priority: High  . Ocular migraine 03/13/2017    Priority: Medium  . Lower extremity neuropathy 08/23/2013    Priority: Medium  . Hyperlipemia 01/07/2010    Priority: Medium  . GASTROESOPHAGEAL REFLUX DISEASE 06/19/2009    Priority: Medium  . Essential tremor 11/16/2007    Priority: Medium  . Essential hypertension 11/16/2007    Priority: Medium  . IBS (irritable bowel syndrome) 09/12/2016    Priority: Low  . History of adenomatous polyp of colon 09/12/2016    Priority: Low  . Former smoker 09/12/2016    Priority: Low  . Carpal tunnel syndrome 09/04/2013    Priority: Low  . Achilles tendon tear 01/17/2011    Priority: Low  . ACNE ROSACEA 04/03/2008    Priority: Low  . OSA (obstructive sleep apnea) 12/21/2016    Medications- reviewed and updated Current Outpatient Prescriptions  Medication Sig Dispense Refill  . aspirin EC 81 MG tablet Take 81 mg by mouth daily.    . calcium carbonate (TUMS - DOSED IN MG ELEMENTAL CALCIUM) 500 MG chewable tablet Chew 2 tablets by mouth daily.    . Cholecalciferol (VITAMIN D3) 1000 units CAPS Take 1 capsule by mouth daily.    . Multiple Vitamins-Minerals (ICAPS AREDS 2) CAPS Take 1 capsule by mouth daily.    . nadolol-bendroflumethiazide (CORZIDE) 40-5 MG tablet TAKE 1 TABLET DAILY 90 tablet 1  . ranitidine (ZANTAC) 300 MG capsule Take 1 capsule (300 mg total) by mouth 2 (two) times daily. (Patient taking differently: Take 300 mg by mouth every evening. ) 180 capsule 3  . Turmeric 500 MG CAPS Take 1 capsule by mouth daily.     No current facility-administered medications for this visit.     Objective: BP 116/84 (BP Location: Left  Arm, Patient Position: Sitting, Cuff Size: Large)   Pulse 76   Temp 98.4 F (36.9 C) (Oral)   Ht 5\' 5"  (1.651 m)   Wt 168 lb (76.2 kg)   SpO2 94%   BMI 27.96 kg/m  Gen: NAD, resting comfortably CV: RRR no murmurs rubs or gallops Lungs: CTAB no crackles, wheeze, rhonchi Ext: no edema Skin: warm, dry  Assessment/Plan:  Ocular migraine- Followed by neurology in past. She states after august she had recurrence. Advised return visit- she saw them and no further follow up. Mainly with bright lights  Osteopenia S:  osteopenia with worst t score -2.3 in September 2016. On calcium/vitamin D.  A/P: continue calcium/vitamin D. Consider fosamax given increased fracture risk unless stable completely over 2 years. Has letter- just needs to make appointment- believes [facility]. Discussed importance of weight bearing exercise.   Essential hypertension S: controlled on corzide.  BP Readings from Last 3 Encounters:  09/13/17 116/84  07/12/17 (!) 142/76  03/14/17 124/68  A/P: We discussed blood pressure goal of <140/90. Continue current meds  GASTROESOPHAGEAL REFLUX DISEASE S: she is compliant with ranitidine 300mg  in evening. History of stricture. Trying to get off PPI due to osteopenia. May change to 150mg  BID if not controlled. January had stretching of esophagus- this is actually the best she has done in some time.  A/P:continue current meds  Hyperlipemia S:  mild poorly controlled on no statin. No myalgias.  Lab Results  Component Value Date   CHOL 192 03/14/2017   HDL 62.00 03/14/2017   LDLCALC 111 (H) 03/14/2017   LDLDIRECT 128.8 01/17/2011   TRIG 94.0 03/14/2017   CHOLHDL 3 03/14/2017   A/P: given her age- has opted out of statin for primary prevention given risk/benefit unclear at age 56 for primary prevention . She is firmly against statin.   Has picked up meat some so will watch this next year. See if any increase.   Return in about 6 months (around 03/13/2018) for follow up- or  sooner if needed. Discussed weight loss. Has been trending up slightly.  Wt Readings from Last 3 Encounters:  09/13/17 168 lb (76.2 kg)  07/12/17 164 lb 12.8 oz (74.8 kg)  03/14/17 165 lb (74.8 kg)   Return precautions advised.  Garret Reddish, MD

## 2017-09-13 NOTE — Assessment & Plan Note (Signed)
S:  osteopenia with worst t score -2.3 in September 2016. On calcium/vitamin D.  A/P: continue calcium/vitamin D. Consider fosamax given increased fracture risk unless stable completely over 2 years. Has letter- just needs to make appointment- believes [facility]. Discussed importance of weight bearing exercise.

## 2017-09-13 NOTE — Assessment & Plan Note (Signed)
S: mild poorly controlled on no statin. No myalgias.  Lab Results  Component Value Date   CHOL 192 03/14/2017   HDL 62.00 03/14/2017   LDLCALC 111 (H) 03/14/2017   LDLDIRECT 128.8 01/17/2011   TRIG 94.0 03/14/2017   CHOLHDL 3 03/14/2017   A/P: given her age- has opted out of statin for primary prevention given risk/benefit unclear at age 83 for primary prevention . She is firmly against statin.   Has picked up meat some so will watch this next year. See if any increase.

## 2017-09-13 NOTE — Patient Instructions (Addendum)
Please get your flu shot in October or November.   Please get your bone density scheduled  No other changes  Let us know if you get Shingrix and we can put it in the computer

## 2017-09-13 NOTE — Assessment & Plan Note (Signed)
S: controlled on corzide.  BP Readings from Last 3 Encounters:  09/13/17 116/84  07/12/17 (!) 142/76  03/14/17 124/68  A/P: We discussed blood pressure goal of <140/90. Continue current meds

## 2017-09-27 DIAGNOSIS — M8589 Other specified disorders of bone density and structure, multiple sites: Secondary | ICD-10-CM | POA: Diagnosis not present

## 2017-09-27 LAB — HM DEXA SCAN

## 2017-10-09 ENCOUNTER — Encounter: Payer: Self-pay | Admitting: Family Medicine

## 2017-10-30 ENCOUNTER — Ambulatory Visit (INDEPENDENT_AMBULATORY_CARE_PROVIDER_SITE_OTHER): Payer: Medicare Other

## 2017-10-30 DIAGNOSIS — Z23 Encounter for immunization: Secondary | ICD-10-CM

## 2017-12-07 ENCOUNTER — Telehealth: Payer: Self-pay | Admitting: Adult Health

## 2017-12-07 DIAGNOSIS — G4733 Obstructive sleep apnea (adult) (pediatric): Secondary | ICD-10-CM

## 2017-12-07 NOTE — Telephone Encounter (Signed)
Spoke with pt, advised her that I will await TP recommendations and then we would call her.

## 2017-12-07 NOTE — Telephone Encounter (Signed)
Called pt to discuss, LM to call back.   I pulled a download for TP to review. Please advise

## 2017-12-07 NOTE — Telephone Encounter (Signed)
Patient returned call, CB is [phone].

## 2017-12-08 NOTE — Telephone Encounter (Signed)
Spoke with patient. She is aware of TPs recs. She stated that she uses [facility] as her DME. Will send an order to them. She verbalized understanding. Nothing else needed at time of call.

## 2017-12-08 NOTE — Telephone Encounter (Signed)
Per TP: okay to decrease pressure setting to 5-10cm H2O with download in 4 weeks please.  Thank you.

## 2017-12-16 ENCOUNTER — Other Ambulatory Visit: Payer: Self-pay | Admitting: Family Medicine

## 2017-12-28 DIAGNOSIS — H43813 Vitreous degeneration, bilateral: Secondary | ICD-10-CM | POA: Diagnosis not present

## 2017-12-28 DIAGNOSIS — H353131 Nonexudative age-related macular degeneration, bilateral, early dry stage: Secondary | ICD-10-CM | POA: Diagnosis not present

## 2017-12-28 DIAGNOSIS — H04123 Dry eye syndrome of bilateral lacrimal glands: Secondary | ICD-10-CM | POA: Diagnosis not present

## 2017-12-28 DIAGNOSIS — H52203 Unspecified astigmatism, bilateral: Secondary | ICD-10-CM | POA: Diagnosis not present

## 2018-02-27 DIAGNOSIS — L281 Prurigo nodularis: Secondary | ICD-10-CM | POA: Diagnosis not present

## 2018-02-27 DIAGNOSIS — D485 Neoplasm of uncertain behavior of skin: Secondary | ICD-10-CM | POA: Diagnosis not present

## 2018-02-27 DIAGNOSIS — Z23 Encounter for immunization: Secondary | ICD-10-CM | POA: Diagnosis not present

## 2018-02-27 DIAGNOSIS — L570 Actinic keratosis: Secondary | ICD-10-CM | POA: Diagnosis not present

## 2018-02-28 DIAGNOSIS — D485 Neoplasm of uncertain behavior of skin: Secondary | ICD-10-CM | POA: Diagnosis not present

## 2018-02-28 DIAGNOSIS — L281 Prurigo nodularis: Secondary | ICD-10-CM | POA: Diagnosis not present

## 2018-03-02 ENCOUNTER — Encounter: Payer: Self-pay | Admitting: Neurology

## 2018-03-02 ENCOUNTER — Telehealth: Payer: Self-pay | Admitting: Neurology

## 2018-03-02 ENCOUNTER — Ambulatory Visit (INDEPENDENT_AMBULATORY_CARE_PROVIDER_SITE_OTHER): Payer: Medicare Other | Admitting: Neurology

## 2018-03-02 VITALS — BP 110/70 | HR 88 | Ht 65.0 in | Wt 171.5 lb

## 2018-03-02 DIAGNOSIS — R278 Other lack of coordination: Secondary | ICD-10-CM

## 2018-03-02 DIAGNOSIS — M5416 Radiculopathy, lumbar region: Secondary | ICD-10-CM

## 2018-03-02 NOTE — Patient Instructions (Addendum)
Start physical therapy for low back strengthening and balance training  Start using a cane for balance  Call my office and let me know if physical therapy does not help

## 2018-03-02 NOTE — Telephone Encounter (Signed)
Patient called and said that she has Peripheral Neuropathy. She said now she is having tingling on the top of her Left leg. The next follow up is in August for follow up. Is that ok to wait? Please Advise.

## 2018-03-02 NOTE — Progress Notes (Signed)
[facility]  Follow-up Visit   Date: 03/02/18   GABRIAL POPPELL MRN: [id number] DOB: Jul 14, 1935   Interim History: CARLISSA PESOLA is a 83 y.o. year-old right-handed Caucasian female with history of GERD, hypertension, and left CTS s/p release (2014) returning to the clinic for new complaints of facial numbness.  The patient was accompanied to the clinic by her husband.    History of present illness: She was doing physical therapy and in March 2014, during which time she developed acute onset of numbness involving the medial thumb and lateral index finger.  NCS/EMG from Sept 2014 showed mild left CTS. She underwent left rotator cuff repair and left carpal tunnel release on 9/16.  She has noticed 80% improvement of numbness/tingling of her thumb and index finger. There is also history of bilateral feet numbness which has been present for a number of years and gait unsteadiness.  She reports having ocular migraines for several years which would occur once per month, described as jagged appearance with loss of central vision involving both eyes, lasting 45 minutes.  During this time, she cannot drive or read because it interferes with her vision.  Over the summer of 2016, the frequency has increased to every 2-3 days, lasting 45 minutes. She does not have associated headaches.  She did not tolerate topiramate and developed worsening pain around her left eye, so stopped it.  She started using polarized sunglasses which has completely resolved her ocular migraines.    UPDATE 03/02/2018:  Patient is here with new tingling involving the left thigh.  A few weeks ago, she developed itching over the left anterior thigh, which has progressed into numbness and tingling over the left lateral surface of the knee.  She has been having increased low back pain.  She denies any similar symptoms on the right leg.  She continues to have imbalance and walks unassisted.   Medications:  Current  Outpatient Medications on File Prior to Visit  Medication Sig Dispense Refill  . aspirin EC 81 MG tablet Take 81 mg by mouth daily.    . calcium carbonate (TUMS - DOSED IN MG ELEMENTAL CALCIUM) 500 MG chewable tablet Chew 2 tablets by mouth daily.    . Cholecalciferol (VITAMIN D3) 1000 units CAPS Take 1 capsule by mouth daily.    . Multiple Vitamins-Minerals (ICAPS AREDS 2) CAPS Take 1 capsule by mouth daily.    . nadolol-bendroflumethiazide (CORZIDE) 40-5 MG tablet TAKE 1 TABLET DAILY 90 tablet 1  . ranitidine (ZANTAC) 300 MG capsule Take 1 capsule (300 mg total) by mouth 2 (two) times daily. (Patient taking differently: Take 300 mg by mouth every evening. ) 180 capsule 3  . Turmeric 500 MG CAPS Take 1 capsule by mouth daily.     No current facility-administered medications on file prior to visit.     Allergies:  Allergies  Allergen Reactions  . Clarithromycin     REACTION: nausea  . Erythromycin Base Nausea And Vomiting  . Levofloxacin     Patient has a history of a Achilles tendon tear and developed tendon pain while on medication  . Metronidazole     REACTION: Rash  . Penicillins     Rash      Review of Systems:  CONSTITUTIONAL: No fevers, chills, night sweats, or weight loss.   EYES: No visual changes or eye pain ENT: No hearing changes.  No history of nose bleeds.  +numbness of the face RESPIRATORY: No cough, wheezing and shortness  of breath.   CARDIOVASCULAR: Negative for chest pain, and palpitations.   GI: Negative for abdominal discomfort, blood in stools or black stools.  No recent change in bowel habits.   GU:  No history of incontinence.   MUSCLOSKELETAL: +history of joint pain or swelling.  No myalgias.   SKIN: Negative for lesions, rash, and itching.   HEMATOLOGY/ONCOLOGY: Negative for prolonged bleeding, bruising easily, and swollen nodes.  ENDOCRINE: Negative for cold or heat intolerance, polydipsia or goiter.   PSYCH:  No depression or anxiety symptoms.    NEURO: As Above.   Vital Signs:  BP 110/70   Pulse 88   Ht _0  (1.651 m)   Wt 171 lb 8 oz (77.8 kg)   SpO2 96%   BMI 28.54 kg/m   General:  Well appearing, comfortable  Neurological Exam: MENTAL STATUS including orientation to time, place, person, recent and remote memory, attention span and concentration, language, and fund of knowledge is normal.  Speech is not dysarthric.  CRANIAL NERVES: Pupils round and reactive to light bilaterally.  Extraocular muscle are intact.  No ptosis.  Face is symmetric  MOTOR: Motor strength is 5/5 throughout, including distal hand and feet muscles. Normal tone.    MSRs:   Right      Left  brachioradialis  3+   brachioradialis  3+  biceps  3+   biceps  3+  triceps  3+   triceps  3+  patellar  3+   patellar  3+   ankle jerk  trace   ankle jerk  1+   SENSORY:  Vibration reduced at the great toes.  COORDINATION/GAIT:   Gait narrow based and stable.   Data: Labs 09/02/2013:  Ceruloplasmin 43, copper 171, fT4 0.98, fT3 2.6, SPEP/UPEP with IFE, 2hr GTT 100*/121/123 Labs 07/05/2013:  Vitamin B12 1225, HbA1c 6.0, TSH 1.69 Labs 06/12/2015:  ESR 11, TSH 2.24  EMG left arm 09/04/2013:  Left median neuropathy at or distal to the wrist (consistent with carpal tunnel syndrome), mild in degree electrically, based the prolongation of the median sensory distal latencies.   IMPRESSION/PLAN:  1. Chronic low back pain with left leg paresthesias and hyperreflexia, concerning for lumbar canal stenosis and/or L4 radiculopathy  - Start physical therapy for low back strengthening and balance training  - If symptoms do not improve, plan for MRI lumbar spine as the next step  - She does not want to start any new medications for her paresthesias  2.  Idiopathic peripheral neuropathy with sensory ataxia, stable  - Neuropathy labs are normal, except mildly abnormal glucose tolerance test  - Recommend that she start using a cane  - Fall precautions  discussed  Return to clinic as needed  Greater than 50% of this 25 minute visit was spent in counseling, explanation of diagnosis, planning of further management, and coordination of care.   Thank you for allowing me to participate in patient's care.  If I can answer any additional questions, I would be pleased to do so.    Sincerely,    Breana Litts K. Posey Pronto, DO

## 2018-03-02 NOTE — Telephone Encounter (Signed)
There is an opening on the schedule today at 3pm, if she can come.

## 2018-03-02 NOTE — Telephone Encounter (Signed)
Patient's last visit was in 2017.  Please advise.

## 2018-03-02 NOTE — Telephone Encounter (Signed)
Patient is coming in today.

## 2018-03-07 ENCOUNTER — Other Ambulatory Visit: Payer: Self-pay

## 2018-03-07 ENCOUNTER — Encounter: Payer: Self-pay | Admitting: Physical Therapy

## 2018-03-07 ENCOUNTER — Ambulatory Visit: Payer: Medicare Other | Attending: Neurology | Admitting: Physical Therapy

## 2018-03-07 DIAGNOSIS — M6281 Muscle weakness (generalized): Secondary | ICD-10-CM | POA: Diagnosis not present

## 2018-03-07 DIAGNOSIS — M545 Low back pain, unspecified: Secondary | ICD-10-CM

## 2018-03-07 NOTE — Patient Instructions (Signed)
Chair Sitting    Sit at edge of seat, spine straight, one leg extended. Put a hand on each thigh and bend forward from the hip, keeping spine straight. Allow hand on extended leg to reach toward toes. Support upper body with other arm. Hold __30_ seconds. Repeat _2__ times per session. Do _1__ sessions per day.  Copyright  VHI. All rights reserved.  Piriformis Stretch, Sitting    Sit, one ankle on opposite knee, same-side hand on crossed knee. Push down on knee, keeping spine straight. Lean torso forward, with flat back, until tension is felt in hamstrings and gluteals of crossed-leg side. Hold __30_ seconds.  Repeat _2__ times per session. Do _1__ sessions per day.  Copyright  VHI. All rights reserved.  [facility] [address] [place], [place] [address] Phone # [phone] Fax [phone]

## 2018-03-07 NOTE — Therapy (Signed)
[facility] [address] [place], [place], [address] Phone: [phone]   Fax:  [phone]  Physical Therapy Evaluation  Patient Details  Name: Adriana Hendricks MRN: [id number] Date of Birth: [birth year] Referring Provider: Dr. Narda Amber   Encounter Date: 03/07/2018  PT End of Session - 03/07/18 1652    Visit Number  1    Date for PT Re-Evaluation  05/02/18    Authorization Type  Medicare BCBS federal    PT Start Time  1530    PT Stop Time  1610    PT Time Calculation (min)  40 min    Activity Tolerance  Patient tolerated treatment well    Behavior During Therapy  [facility] for tasks assessed/performed       Past Medical History:  Diagnosis Date  . Achilles tendon rupture   . Arthritis of hand, right   . Carpal tunnel syndrome 09/04/2013  . Collagenous colitis 2005  . Colon polyps 2010   Tubular adenoma and hyperplastic  . Dental infection 10/25/2014  . Diverticulosis   . Esophageal stenosis   . Essential tremor   . GERD (gastroesophageal reflux disease)    hx hiatal hernia, hx esophagitis, hx stricture  . Heart murmur   . Hiatal hernia   . Hx: UTI (urinary tract infection)   . Hypertension   . Lower extremity neuropathy 08/23/2013   On B12 therapy with numbness in the feet bilaterally no evidence of diabetes   . Ocular migraine    jagged vision, a few per month  . OSA (obstructive sleep apnea) 12/21/2016  . Peripheral neuropathy    treated by Dr Posey Pronto (07/2015)    Past Surgical History:  Procedure Laterality Date  . APPENDECTOMY    . CARPAL TUNNEL RELEASE    . CATARACT EXTRACTION    . EXCISION MORTON'S NEUROMA     Right foot  . INTRAOCULAR LENS IMPLANT, SECONDARY    . Moles removed    . MOUTH SURGERY     (For Exostosis)  . ROTATOR CUFF REPAIR    . TONSILLECTOMY      There were no vitals filed for this visit.   Subjective Assessment - 03/07/18 1541    Subjective  Patient feels her chair is  not good for her back.  Patient reports her thigh on left started to itch, then numbness went to the left knee.  Patient has pain in her back.  Patient reports pain started 2 weeks ago.      Patient Stated Goals  get rid of pain and numbness    Currently in Pain?  Yes    Pain Score  2     Pain Location  Back    Pain Orientation  Right    Pain Descriptors / Indicators  Tingling;Pins and needles;Aching    Pain Type  Acute pain    Pain Onset  1 to 4 weeks ago    Pain Frequency  Intermittent    Aggravating Factors   being upright; sitting, standing    Pain Relieving Factors  laying down    Multiple Pain Sites  No         [facility] PT Assessment - 03/07/18 0001      Assessment   Medical Diagnosis  R27.8 Sensory ataxia; M54.16 Lumbar radiculopathy    Referring Provider  Dr. Narda Amber    Onset Date/Surgical Date  02/21/18    Prior Therapy  none      Precautions  Precautions  None      Restrictions   Weight Bearing Restrictions  No      Balance Screen   Has the patient fallen in the past 6 months  No    Has the patient had a decrease in activity level because of a fear of falling?   No    Is the patient reluctant to leave their home because of a fear of falling?   No      Home Film/video editor residence      Prior Function   Level of Independence  Independent    Leisure  sitting at computer      Cognition   Overall Cognitive Status  Within Functional Limits for tasks assessed      Observation/Other Assessments   Focus on Therapeutic Outcomes (FOTO)   39% limitation goal is 25% limitation      Posture/Postural Control   Posture/Postural Control  No significant limitations      ROM / Strength   AROM / PROM / Strength  AROM;PROM;Strength      AROM   Lumbar Extension  decreased by 25%    Lumbar - Right Side Bend  decreased by 25%    Lumbar - Left Side Bend  decreased by 25%      PROM   Overall PROM   Within functional limits for tasks  performed      Strength   Right Hip ABduction  4/5 pain in right low back      Palpation   Palpation comment  tenderness located in right piriformis, right lumbar paraspinals, right  quadratus, right gluteal medius             Objective measurements completed on examination: See above findings.              PT Education - 03/07/18 1652    Education provided  Yes    Education Details  stretch for piriformis and hamstring    Person(s) Educated  Patient    Methods  Explanation;Demonstration;Verbal cues;Handout    Comprehension  Returned demonstration;Verbalized understanding       PT Short Term Goals - 03/07/18 1659      PT SHORT TERM GOAL #1   Title  be independent in initial HEP    Time  4    Period  Weeks    Status  New    Target Date  04/04/18      PT SHORT TERM GOAL #2   Title  ability to sit for 30 minutes with good posture and pain decreased >/= 25%    Time  4    Period  Weeks    Status  New    Target Date  04/04/18        PT Long Term Goals - 03/07/18 1701      PT LONG TERM GOAL #1   Title  be independent in advanced HEP    Time  8    Period  Weeks    Status  New    Target Date  05/02/18      PT LONG TERM GOAL #2   Title  able to perform daily tasks with lumbar pain decreased >/= 50% due to improve trunk strength    Time  8    Period  Weeks    Status  New    Target Date  05/02/18      PT LONG TERM GOAL #3   Title  able to sit for 45 minutes with back pain decreased >/= 50%    Time  8    Period  Weeks    Status  New    Target Date  05/02/18      PT LONG TERM GOAL #4   Title  able to stand for 15 minutes in a store with pain level decreased >/= 50%    Time  8    Period  Weeks    Status  New    Target Date  05/02/18      PT LONG TERM GOAL #5   Title  FOTO score </= 25% limitation    Time  8    Period  Weeks    Status  New    Target Date  05/02/18             Plan - 03/07/18 1653    Clinical Impression Statement   Patient is a 83 year old female with sudden onset of lumbar pain and parathesias of the left thigh 2 weeks ago.  Patient reports intermittent back pain at level 2/10 being up right, sitting and standing.  Lumbar ROM is decreased by 25%.  right hip abduction is 4/5 with pain in lumbar.  Patient has tenderness located in right gluteal, right piriformis, and lumbar sacral area.  Patient will benefit from skilled therapy to reduce pain and improve function.     History and Personal Factors relevant to plan of care:  lower extremity neuropathy    Clinical Presentation  Stable    Clinical Presentation due to:  stable condition    Clinical Decision Making  Low    Rehab Potential  Excellent    Clinical Impairments Affecting Rehab Potential  lower extremity neuropathy    PT Frequency  2x / week    PT Duration  8 weeks    PT Treatment/Interventions  Cryotherapy;Electrical Stimulation;Moist Heat;Traction;Ultrasound;Therapeutic exercise;Therapeutic activities;Neuromuscular re-education;Patient/family education;Manual techniques;Dry needling    PT Next Visit Plan  review stretches; body mechanics with daily activity, abdominal bracing with exercise, lumbar strengthening    PT Home Exercise Plan  progress as needed    Consulted and Agree with Plan of Care  Patient       Patient will benefit from skilled therapeutic intervention in order to improve the following deficits and impairments:  Pain, Increased fascial restricitons, Decreased activity tolerance, Decreased strength, Increased muscle spasms, Decreased range of motion  Visit Diagnosis: Acute right-sided low back pain without sciatica - Plan: PT plan of care cert/re-cert  Muscle weakness (generalized) - Plan: PT plan of care cert/re-cert     Problem List Patient Active Problem List   Diagnosis Date Noted  . Osteopenia 03/13/2017  . Ocular migraine 03/13/2017  . OSA (obstructive sleep apnea) 12/21/2016  . IBS (irritable bowel syndrome)  09/12/2016  . History of adenomatous polyp of colon 09/12/2016  . Former smoker 09/12/2016  . Carpal tunnel syndrome 09/04/2013  . Lower extremity neuropathy 08/23/2013  . Achilles tendon tear 01/17/2011  . Hyperlipemia 01/07/2010  . GASTROESOPHAGEAL REFLUX DISEASE 06/19/2009  . ACNE ROSACEA 04/03/2008  . Essential tremor 11/16/2007  . Essential hypertension 11/16/2007    Earlie Counts, PT 03/07/18 5:06 PM   [facility] [address] [place], [place], [address] Phone: [phone]   Fax:  [phone]  Name: Adriana Hendricks MRN: [id number] Date of Birth: Apr 26, 1935

## 2018-03-13 ENCOUNTER — Ambulatory Visit: Payer: Medicare Other

## 2018-03-13 DIAGNOSIS — M6281 Muscle weakness (generalized): Secondary | ICD-10-CM | POA: Diagnosis not present

## 2018-03-13 DIAGNOSIS — M545 Low back pain, unspecified: Secondary | ICD-10-CM

## 2018-03-13 NOTE — Therapy (Signed)
[facility] [address], [place], [place], [address] Phone: [phone]   Fax:  [phone]  Physical Therapy Treatment  Patient Details  Name: Adriana Hendricks MRN: [id number] Date of Birth: [birth year] Referring Provider: Dr. Narda Amber   Encounter Date: 03/13/2018  PT End of Session - 03/13/18 1219    Visit Number  2    Date for PT Re-Evaluation  05/02/18    Authorization Type  Medicare BCBS federal    PT Start Time  1146    PT Stop Time  1226    PT Time Calculation (min)  40 min    Activity Tolerance  Patient tolerated treatment well    Behavior During Therapy  [facility] for tasks assessed/performed       Past Medical History:  Diagnosis Date  . Achilles tendon rupture   . Arthritis of hand, right   . Carpal tunnel syndrome 09/04/2013  . Collagenous colitis 2005  . Colon polyps 2010   Tubular adenoma and hyperplastic  . Dental infection 10/25/2014  . Diverticulosis   . Esophageal stenosis   . Essential tremor   . GERD (gastroesophageal reflux disease)    hx hiatal hernia, hx esophagitis, hx stricture  . Heart murmur   . Hiatal hernia   . Hx: UTI (urinary tract infection)   . Hypertension   . Lower extremity neuropathy 08/23/2013   On B12 therapy with numbness in the feet bilaterally no evidence of diabetes   . Ocular migraine    jagged vision, a few per month  . OSA (obstructive sleep apnea) 12/21/2016  . Peripheral neuropathy    treated by Dr Posey Pronto (07/2015)    Past Surgical History:  Procedure Laterality Date  . APPENDECTOMY    . CARPAL TUNNEL RELEASE    . CATARACT EXTRACTION    . EXCISION MORTON'S NEUROMA     Right foot  . INTRAOCULAR LENS IMPLANT, SECONDARY    . Moles removed    . MOUTH SURGERY     (For Exostosis)  . ROTATOR CUFF REPAIR    . TONSILLECTOMY      There were no vitals filed for this visit.  Subjective Assessment - 03/13/18 1150    Subjective  I have been trying to stretch.       Patient Stated Goals  get rid of pain and numbness    Currently in Pain?  Yes    Pain Score  3     Pain Location  Back    Pain Orientation  Right    Pain Descriptors / Indicators  Sore;Aching    Pain Type  Acute pain    Pain Onset  1 to 4 weeks ago    Pain Frequency  Intermittent    Aggravating Factors   being upright, sitting, standing    Pain Relieving Factors  laying down                      [facility] Adult PT Treatment/Exercise - 03/13/18 0001      Exercises   Exercises  Knee/Hip;Lumbar      Lumbar Exercises: Stretches   Active Hamstring Stretch  Left;Right;3 reps;20 seconds    Piriformis Stretch  Left;Right;3 reps;20 seconds      Lumbar Exercises: Aerobic   Nustep  Level 2x 8 minutes      Lumbar Exercises: Supine   Ab Set  10 reps;5 seconds    Straight Leg Raise  10 reps  Lumbar Exercises: Sidelying   Clam  Both;20 reps             PT Education - 03/13/18 1218    Education provided  Yes    Education Details  body mechanics, core strength    Person(s) Educated  Patient    Methods  Explanation;Demonstration;Handout    Comprehension  Verbalized understanding;Returned demonstration       PT Short Term Goals - 03/07/18 1659      PT SHORT TERM GOAL #1   Title  be independent in initial HEP    Time  4    Period  Weeks    Status  New    Target Date  04/04/18      PT SHORT TERM GOAL #2   Title  ability to sit for 30 minutes with good posture and pain decreased >/= 25%    Time  4    Period  Weeks    Status  New    Target Date  04/04/18        PT Long Term Goals - 03/07/18 1701      PT LONG TERM GOAL #1   Title  be independent in advanced HEP    Time  8    Period  Weeks    Status  New    Target Date  05/02/18      PT LONG TERM GOAL #2   Title  able to perform daily tasks with lumbar pain decreased >/= 50% due to improve trunk strength    Time  8    Period  Weeks    Status  New    Target Date  05/02/18      PT LONG TERM  GOAL #3   Title  able to sit for 45 minutes with back pain decreased >/= 50%    Time  8    Period  Weeks    Status  New    Target Date  05/02/18      PT LONG TERM GOAL #4   Title  able to stand for 15 minutes in a store with pain level decreased >/= 50%    Time  8    Period  Weeks    Status  New    Target Date  05/02/18      PT LONG TERM GOAL #5   Title  FOTO score </= 25% limitation    Time  8    Period  Weeks    Status  New    Target Date  05/02/18            Plan - 03/13/18 1151    Clinical Impression Statement  Pt with one session after evaluation.  Pt was able to demonstrate initial HEP with minor verbal cues.  PT provided body mechanics education and instruction in abdominal bracing.  Pt with weak hips and core and will continue to benefit from skilled PT for core strength, body mechanics and hip flexibility.      Rehab Potential  Excellent    PT Frequency  2x / week    PT Duration  8 weeks    PT Treatment/Interventions  Cryotherapy;Electrical Stimulation;Moist Heat;Traction;Ultrasound;Therapeutic exercise;Therapeutic activities;Neuromuscular re-education;Patient/family education;Manual techniques;Dry needling    PT Next Visit Plan  body mechanics with daily activity, abdominal bracing with exercise, lumbar strengthening    Recommended Other Services  initial summary is signed    Consulted and Agree with Plan of Care  Patient  Patient will benefit from skilled therapeutic intervention in order to improve the following deficits and impairments:  Pain, Increased fascial restricitons, Decreased activity tolerance, Decreased strength, Increased muscle spasms, Decreased range of motion  Visit Diagnosis: Acute right-sided low back pain without sciatica  Muscle weakness (generalized)     Problem List Patient Active Problem List   Diagnosis Date Noted  . Osteopenia 03/13/2017  . Ocular migraine 03/13/2017  . OSA (obstructive sleep apnea) 12/21/2016  . IBS  (irritable bowel syndrome) 09/12/2016  . History of adenomatous polyp of colon 09/12/2016  . Former smoker 09/12/2016  . Carpal tunnel syndrome 09/04/2013  . Lower extremity neuropathy 08/23/2013  . Achilles tendon tear 01/17/2011  . Hyperlipemia 01/07/2010  . GASTROESOPHAGEAL REFLUX DISEASE 06/19/2009  . ACNE ROSACEA 04/03/2008  . Essential tremor 11/16/2007  . Essential hypertension 11/16/2007   Sigurd Sos, PT 03/13/18 12:21 PM  [facility] [address] [place], [place], [address] Phone: [phone]   Fax:  [phone]  Name: AMNAH BREUER MRN: [id number] Date of Birth: 08-Jan-1935

## 2018-03-13 NOTE — Patient Instructions (Addendum)
   Lifting Principles  .Maintain proper posture and head alignment. .Slide object as close as possible before lifting. .Move obstacles out of the way. .Test before lifting; ask for help if too heavy. .Tighten stomach muscles without holding breath. .Use smooth movements; do not jerk. .Use legs to do the work, and pivot with feet. .Distribute the work load symmetrically and close to the center of trunk. .Push instead of pull whenever possible.   Squat down and hold basket close to stand. Use leg muscles to do the work.    Avoid twisting or bending back. Pivot around using foot movements, and bend at knees if needed when reaching for articles.        Getting Into / Out of Bed   Lower self to lie down on one side by raising legs and lowering head at the same time. Use arms to assist moving without twisting. Bend both knees to roll onto back if desired. To sit up, start from lying on side, and use same move-ments in reverse. Keep trunk aligned with legs.    Shift weight from front foot to back foot as item is lifted off shelf.    When leaning forward to pick object up from floor, extend one leg out behind. Keep back straight. Hold onto a sturdy support with other hand.      Sit upright, head facing forward. Try using a roll to support lower back. Keep shoulders relaxed, and avoid rounded back. Keep hips level with knees. Avoid crossing legs for long periods.   Abduction: Clam (Eccentric) - Side-Lying    Lie on side with knees bent. Lift top knee, keeping feet together. Keep trunk steady. Slowly lower for 3-5 seconds. _10__ reps per set, _2__ sets per day, ___ days per week.  Lower abdominal/core stability exercises  1. Practice your breathing technique: Inhale through your nose expanding your belly and rib cage. Try not to breathe into your chest. Exhale slowly and gradually out your mouth feeling a sense of softness to your body. Practice multiple times. This can  be performed unlimited.  2. Finding the lower abdominals. Laying on your back with the knees bent, place your fingers just below your belly button. Using your breathing technique from above, on your exhale gently pull the belly button away from your fingertips without tensing any other muscles. Practice this 5x. Next, as you exhale, draw belly button inwards and hold onto it...then feel as if you are pulling that muscle across your pelvis like you are tightening a belt. This can be hard to do at first so be patient and practice. Do 5-10 reps 1-3 x day. Always recognize quality over quantity; if your abdominal muscles become tired you will notice you may tighten/contract other muscles. This is the time to take a break.   Practice this first laying on your back, then in sitting, progressing to standing and finally adding it to all your daily movements.    [facility] [address], [place], [place] [address] Phone # [phone] Fax [phone]

## 2018-03-21 ENCOUNTER — Encounter: Payer: Self-pay | Admitting: Physical Therapy

## 2018-03-21 ENCOUNTER — Ambulatory Visit: Payer: Medicare Other | Admitting: Physical Therapy

## 2018-03-21 DIAGNOSIS — M545 Low back pain, unspecified: Secondary | ICD-10-CM

## 2018-03-21 DIAGNOSIS — M6281 Muscle weakness (generalized): Secondary | ICD-10-CM

## 2018-03-21 NOTE — Therapy (Signed)
[facility] [address], [place], [place], [address] Phone: [phone]   Fax:  [phone]  Physical Therapy Treatment  Patient Details  Name: Adriana Hendricks MRN: [id number] Date of Birth: [birth year] Referring Provider: Dr. Narda Amber   Encounter Date: 03/21/2018  PT End of Session - 03/21/18 1231    Visit Number  3    Date for PT Re-Evaluation  05/02/18    Authorization Type  Medicare BCBS federal    PT Start Time  1145    PT Stop Time  1230    PT Time Calculation (min)  45 min    Activity Tolerance  Patient tolerated treatment well    Behavior During Therapy  [facility] for tasks assessed/performed       Past Medical History:  Diagnosis Date  . Achilles tendon rupture   . Arthritis of hand, right   . Carpal tunnel syndrome 09/04/2013  . Collagenous colitis 2005  . Colon polyps 2010   Tubular adenoma and hyperplastic  . Dental infection 10/25/2014  . Diverticulosis   . Esophageal stenosis   . Essential tremor   . GERD (gastroesophageal reflux disease)    hx hiatal hernia, hx esophagitis, hx stricture  . Heart murmur   . Hiatal hernia   . Hx: UTI (urinary tract infection)   . Hypertension   . Lower extremity neuropathy 08/23/2013   On B12 therapy with numbness in the feet bilaterally no evidence of diabetes   . Ocular migraine    jagged vision, a few per month  . OSA (obstructive sleep apnea) 12/21/2016  . Peripheral neuropathy    treated by Dr Posey Pronto (07/2015)    Past Surgical History:  Procedure Laterality Date  . APPENDECTOMY    . CARPAL TUNNEL RELEASE    . CATARACT EXTRACTION    . EXCISION MORTON'S NEUROMA     Right foot  . INTRAOCULAR LENS IMPLANT, SECONDARY    . Moles removed    . MOUTH SURGERY     (For Exostosis)  . ROTATOR CUFF REPAIR    . TONSILLECTOMY      There were no vitals filed for this visit.  Subjective Assessment - 03/21/18 1154    Subjective  I feel achy today.  I have been  getting ready for company and have been doing alot of bending and stooping. The pain in back is more than the numbnes so I am not noticing the numbness.     Patient Stated Goals  get rid of pain and numbness    Currently in Pain?  Yes    Pain Score  4     Pain Location  Back    Pain Orientation  Right;Mid    Pain Descriptors / Indicators  Aching;Sharp    Pain Type  Acute pain    Pain Radiating Towards  less in right hip    Pain Onset  1 to 4 weeks ago    Pain Frequency  Intermittent    Aggravating Factors   being upright, sitting, standing    Pain Relieving Factors  laying down    Multiple Pain Sites  No                No data recorded       [facility] Adult PT Treatment/Exercise - 03/21/18 0001      Lumbar Exercises: Stretches   Active Hamstring Stretch  Left;Right;3 reps;20 seconds VC on keeping trunk erect    Passive Hamstring Stretch  Right;30 seconds;2 reps also do neural tension stretch    Piriformis Stretch  Left;Right;3 reps;20 seconds VC on keeping spine straight and bend at hips      Lumbar Exercises: Aerobic   Nustep  Level 2x 8 minutes seat #8, arm #10 while discuss progress      Lumbar Exercises: Supine   Ab Set  10 reps;5 seconds VC to engage abdominals    Straight Leg Raise  10 reps VC for abdominal bracing      Lumbar Exercises: Sidelying   Clam  Both;20 reps       Trigger Point Dry Needling - 03/21/18 1223    Consent Given?  Yes    Education Handout Provided  Yes    Muscles Treated Upper Body  Quadratus Lumborum;Gluteus minimus;Gluteus maximus    Muscles Treated Lower Body  Gluteus maximus;Gluteus minimus    Gluteus Maximus Response  Twitch response elicited;Palpable increased muscle length    Gluteus Minimus Response  Twitch response elicited;Palpable increased muscle length           PT Education - 03/21/18 1231    Education provided  Yes    Education Details  information on dry needling    Person(s) Educated  Patient    Methods   Explanation;Handout    Comprehension  Verbalized understanding       PT Short Term Goals - 03/21/18 1156      PT SHORT TERM GOAL #1   Title  be independent in initial HEP    Time  4    Period  Weeks    Status  Achieved      PT SHORT TERM GOAL #2   Title  ability to sit for 30 minutes with good posture and pain decreased >/= 25%    Baseline  prefers a lumbar support    Time  4    Period  Weeks    Status  On-going        PT Long Term Goals - 03/07/18 1701      PT LONG TERM GOAL #1   Title  be independent in advanced HEP    Time  8    Period  Weeks    Status  New    Target Date  05/02/18      PT LONG TERM GOAL #2   Title  able to perform daily tasks with lumbar pain decreased >/= 50% due to improve trunk strength    Time  8    Period  Weeks    Status  New    Target Date  05/02/18      PT LONG TERM GOAL #3   Title  able to sit for 45 minutes with back pain decreased >/= 50%    Time  8    Period  Weeks    Status  New    Target Date  05/02/18      PT LONG TERM GOAL #4   Title  able to stand for 15 minutes in a store with pain level decreased >/= 50%    Time  8    Period  Weeks    Status  New    Target Date  05/02/18      PT LONG TERM GOAL #5   Title  FOTO score </= 25% limitation    Time  8    Period  Weeks    Status  New    Target Date  05/02/18  Plan - 03/21/18 1156    Clinical Impression Statement  Patient was achy today and increased in pain due to getting ready for company. Patient is able to do exercise with good abdominal control.  Patient has trigger points in the gluteus medius and quadratus. Patietn will benefit form skilled therapy to increase core strength , body mechanincs and hip flexibility.     Rehab Potential  Excellent    Clinical Impairments Affecting Rehab Potential  lower extremity neuropathy    PT Frequency  2x / week    PT Duration  8 weeks    PT Treatment/Interventions  Cryotherapy;Electrical Stimulation;Moist  Heat;Traction;Ultrasound;Therapeutic exercise;Therapeutic activities;Neuromuscular re-education;Patient/family education;Manual techniques;Dry needling    PT Next Visit Plan  abdominal bracing with exercise, lumbar strengthening; see response of DN    PT Home Exercise Plan  progress as needed    Consulted and Agree with Plan of Care  Patient       Patient will benefit from skilled therapeutic intervention in order to improve the following deficits and impairments:  Pain, Increased fascial restricitons, Decreased activity tolerance, Decreased strength, Increased muscle spasms, Decreased range of motion  Visit Diagnosis: Acute right-sided low back pain without sciatica  Muscle weakness (generalized)     Problem List Patient Active Problem List   Diagnosis Date Noted  . Osteopenia 03/13/2017  . Ocular migraine 03/13/2017  . OSA (obstructive sleep apnea) 12/21/2016  . IBS (irritable bowel syndrome) 09/12/2016  . History of adenomatous polyp of colon 09/12/2016  . Former smoker 09/12/2016  . Carpal tunnel syndrome 09/04/2013  . Lower extremity neuropathy 08/23/2013  . Achilles tendon tear 01/17/2011  . Hyperlipemia 01/07/2010  . GASTROESOPHAGEAL REFLUX DISEASE 06/19/2009  . ACNE ROSACEA 04/03/2008  . Essential tremor 11/16/2007  . Essential hypertension 11/16/2007    Earlie Counts, PT 03/21/18 12:40 PM    [facility] [address], [place], [place], [address] Phone: [phone]   Fax:  [phone]  Name: LEXIE KOEHL MRN: [id number] Date of Birth: 12/21/1935

## 2018-03-21 NOTE — Patient Instructions (Signed)

## 2018-03-29 ENCOUNTER — Ambulatory Visit: Payer: Medicare Other | Attending: Neurology

## 2018-03-29 DIAGNOSIS — M6281 Muscle weakness (generalized): Secondary | ICD-10-CM | POA: Diagnosis not present

## 2018-03-29 DIAGNOSIS — M545 Low back pain, unspecified: Secondary | ICD-10-CM

## 2018-03-29 NOTE — Therapy (Signed)
[facility] [address], [place], [place], [address] Phone: [phone]   Fax:  [phone]  Physical Therapy Treatment  Patient Details  Name: Adriana Hendricks MRN: [id number] Date of Birth: 09/06/1935 Referring Provider: Dr. Narda Amber   Encounter Date: 03/29/2018  PT End of Session - 03/29/18 1313    Visit Number  4    Date for PT Re-Evaluation  05/02/18    Authorization Type  Medicare BCBS federal    PT Start Time  1230    PT Stop Time  1313    PT Time Calculation (min)  43 min    Activity Tolerance  Patient tolerated treatment well    Behavior During Therapy  [facility] for tasks assessed/performed       Past Medical History:  Diagnosis Date  . Achilles tendon rupture   . Arthritis of hand, right   . Carpal tunnel syndrome 09/04/2013  . Collagenous colitis 2005  . Colon polyps 2010   Tubular adenoma and hyperplastic  . Dental infection 10/25/2014  . Diverticulosis   . Esophageal stenosis   . Essential tremor   . GERD (gastroesophageal reflux disease)    hx hiatal hernia, hx esophagitis, hx stricture  . Heart murmur   . Hiatal hernia   . Hx: UTI (urinary tract infection)   . Hypertension   . Lower extremity neuropathy 08/23/2013   On B12 therapy with numbness in the feet bilaterally no evidence of diabetes   . Ocular migraine    jagged vision, a few per month  . OSA (obstructive sleep apnea) 12/21/2016  . Peripheral neuropathy    treated by Dr Posey Pronto (07/2015)    Past Surgical History:  Procedure Laterality Date  . APPENDECTOMY    . CARPAL TUNNEL RELEASE    . CATARACT EXTRACTION    . EXCISION MORTON'S NEUROMA     Right foot  . INTRAOCULAR LENS IMPLANT, SECONDARY    . Moles removed    . MOUTH SURGERY     (For Exostosis)  . ROTATOR CUFF REPAIR    . TONSILLECTOMY      There were no vitals filed for this visit.  Subjective Assessment - 03/29/18 1233    Subjective  No pain today.  It was up to  7-8/10 yesterday and I had to take Aleve.  I didn't notice a difference with dry needling.    Currently in Pain?  No/denies    Pain Score  0-No pain                       [facility] Adult PT Treatment/Exercise - 03/29/18 0001      Lumbar Exercises: Stretches   Active Hamstring Stretch  Left;Right;3 reps;20 seconds VC on keeping trunk erect    Single Knee to Chest Stretch  3 reps;20 seconds    Piriformis Stretch  Left;Right;3 reps;20 seconds VC on keeping spine straight and bend at hips      Lumbar Exercises: Aerobic   Nustep  Level 2x 8 minutes seat #8, arm #10 while discuss progress      Lumbar Exercises: Supine   Ab Set  10 reps;5 seconds VC to engage abdominals    Straight Leg Raise  20 reps VC for abdominal bracing      Lumbar Exercises: Sidelying   Clam  Both;20 reps      Manual Therapy   Manual Therapy  Soft tissue mobilization;Myofascial release    Manual therapy comments  bil gluteals with spikey ball for tissue mobilization               PT Short Term Goals - 03/29/18 1256      PT SHORT TERM GOAL #2   Title  ability to sit for 30 minutes with good posture and pain decreased >/= 25%    Baseline  needs chair with good support    Time  4    Period  Weeks    Status  On-going        PT Long Term Goals - 03/07/18 1701      PT LONG TERM GOAL #1   Title  be independent in advanced HEP    Time  8    Period  Weeks    Status  New    Target Date  05/02/18      PT LONG TERM GOAL #2   Title  able to perform daily tasks with lumbar pain decreased >/= 50% due to improve trunk strength    Time  8    Period  Weeks    Status  New    Target Date  05/02/18      PT LONG TERM GOAL #3   Title  able to sit for 45 minutes with back pain decreased >/= 50%    Time  8    Period  Weeks    Status  New    Target Date  05/02/18      PT LONG TERM GOAL #4   Title  able to stand for 15 minutes in a store with pain level decreased >/= 50%    Time  8    Period   Weeks    Status  New    Target Date  05/02/18      PT LONG TERM GOAL #5   Title  FOTO score </= 25% limitation    Time  8    Period  Weeks    Status  New    Target Date  05/02/18            Plan - 03/29/18 1257    Clinical Impression Statement  Pt denies any significant changes in overall pain.  Pt reports that Rt LE radiculopathy is less frequent and no longer constant.  Pt demonstrates core and hip weakness and required minor verbal cues for technique with exercise.  Pt will benefit from skilled Pt for hip and lumbar flexibility, manual for trigger points and modalities as needed.      Rehab Potential  Excellent    PT Frequency  2x / week    PT Duration  8 weeks    PT Treatment/Interventions  Cryotherapy;Electrical Stimulation;Moist Heat;Traction;Ultrasound;Therapeutic exercise;Therapeutic activities;Neuromuscular re-education;Patient/family education;Manual techniques;Dry needling    PT Next Visit Plan  abdominal bracing with exercise, lumbar strengthening    Consulted and Agree with Plan of Care  Patient       Patient will benefit from skilled therapeutic intervention in order to improve the following deficits and impairments:  Pain, Increased fascial restricitons, Decreased activity tolerance, Decreased strength, Increased muscle spasms, Decreased range of motion  Visit Diagnosis: Acute right-sided low back pain without sciatica  Muscle weakness (generalized)     Problem List Patient Active Problem List   Diagnosis Date Noted  . Osteopenia 03/13/2017  . Ocular migraine 03/13/2017  . OSA (obstructive sleep apnea) 12/21/2016  . IBS (irritable bowel syndrome) 09/12/2016  . History of adenomatous polyp of colon 09/12/2016  . Former smoker 09/12/2016  .  Carpal tunnel syndrome 09/04/2013  . Lower extremity neuropathy 08/23/2013  . Achilles tendon tear 01/17/2011  . Hyperlipemia 01/07/2010  . GASTROESOPHAGEAL REFLUX DISEASE 06/19/2009  . ACNE ROSACEA 04/03/2008  .  Essential tremor 11/16/2007  . Essential hypertension 11/16/2007    Sigurd Sos, PT 03/29/18 1:16 PM  [facility] [address] [place], [place], [address] Phone: [phone]   Fax:  [phone]  Name: Adriana Hendricks MRN: [id number] Date of Birth: 12-Aug-1935

## 2018-04-03 ENCOUNTER — Ambulatory Visit: Payer: Medicare Other

## 2018-04-03 DIAGNOSIS — M6281 Muscle weakness (generalized): Secondary | ICD-10-CM

## 2018-04-03 DIAGNOSIS — M545 Low back pain, unspecified: Secondary | ICD-10-CM

## 2018-04-03 NOTE — Therapy (Signed)
[facility] [address] [place], [place], [address] Phone: [phone]   Fax:  [phone]  Physical Therapy Treatment  Patient Details  Name: Adriana Hendricks MRN: [id number] Date of Birth: [birth year] Referring Provider: Dr. Narda Amber   Encounter Date: 04/03/2018  PT End of Session - 04/03/18 1223    Visit Number  5    Date for PT Re-Evaluation  05/02/18    Authorization Type  Medicare BCBS federal    PT Start Time  1146    PT Stop Time  1225    PT Time Calculation (min)  39 min    Activity Tolerance  Patient tolerated treatment well    Behavior During Therapy  [facility] for tasks assessed/performed       Past Medical History:  Diagnosis Date  . Achilles tendon rupture   . Arthritis of hand, right   . Carpal tunnel syndrome 09/04/2013  . Collagenous colitis 2005  . Colon polyps 2010   Tubular adenoma and hyperplastic  . Dental infection 10/25/2014  . Diverticulosis   . Esophageal stenosis   . Essential tremor   . GERD (gastroesophageal reflux disease)    hx hiatal hernia, hx esophagitis, hx stricture  . Heart murmur   . Hiatal hernia   . Hx: UTI (urinary tract infection)   . Hypertension   . Lower extremity neuropathy 08/23/2013   On B12 therapy with numbness in the feet bilaterally no evidence of diabetes   . Ocular migraine    jagged vision, a few per month  . OSA (obstructive sleep apnea) 12/21/2016  . Peripheral neuropathy    treated by Dr Posey Pronto (07/2015)    Past Surgical History:  Procedure Laterality Date  . APPENDECTOMY    . CARPAL TUNNEL RELEASE    . CATARACT EXTRACTION    . EXCISION MORTON'S NEUROMA     Right foot  . INTRAOCULAR LENS IMPLANT, SECONDARY    . Moles removed    . MOUTH SURGERY     (For Exostosis)  . ROTATOR CUFF REPAIR    . TONSILLECTOMY      There were no vitals filed for this visit.  Subjective Assessment - 04/03/18 1148    Subjective  I had to take aleve once over  the weekend.  Lt LE numbness comes and goes.  LBP is overall better.      Patient Stated Goals  get rid of pain and numbness    Currently in Pain?  No/denies                       [facility] Adult PT Treatment/Exercise - 04/03/18 0001      Lumbar Exercises: Stretches   Active Hamstring Stretch  Left;Right;3 reps;20 seconds VC on keeping trunk erect    Single Knee to Chest Stretch  3 reps;20 seconds      Lumbar Exercises: Aerobic   Nustep  Level 2x 8 minutes seat #8, arm #10 while discuss progress      Lumbar Exercises: Supine   Ab Set  10 reps;5 seconds    Bent Knee Raise  10 reps    Straight Leg Raise  20 reps VC for abdominal bracing      Lumbar Exercises: Prone   Straight Leg Raise  20 reps      Manual Therapy   Manual Therapy  Soft tissue mobilization;Myofascial release    Manual therapy comments  bil gluteals with spikey ball for tissue  mobilization               PT Short Term Goals - 03/29/18 1256      PT SHORT TERM GOAL #2   Title  ability to sit for 30 minutes with good posture and pain decreased >/= 25%    Baseline  needs chair with good support    Time  4    Period  Weeks    Status  On-going        PT Long Term Goals - 03/07/18 1701      PT LONG TERM GOAL #1   Title  be independent in advanced HEP    Time  8    Period  Weeks    Status  New    Target Date  05/02/18      PT LONG TERM GOAL #2   Title  able to perform daily tasks with lumbar pain decreased >/= 50% due to improve trunk strength    Time  8    Period  Weeks    Status  New    Target Date  05/02/18      PT LONG TERM GOAL #3   Title  able to sit for 45 minutes with back pain decreased >/= 50%    Time  8    Period  Weeks    Status  New    Target Date  05/02/18      PT LONG TERM GOAL #4   Title  able to stand for 15 minutes in a store with pain level decreased >/= 50%    Time  8    Period  Weeks    Status  New    Target Date  05/02/18      PT LONG TERM GOAL #5    Title  FOTO score </= 25% limitation    Time  8    Period  Weeks    Status  New    Target Date  05/02/18            Plan - 04/03/18 1152    Clinical Impression Statement  Pt reports that Lt LE continues to have intermittent tingling and she is not able to determine a pattern.  Pt with continued hip and core weakness and continues to require verbal cues and tactile cues for techinque with core exercises today. Pt was able to perform higher level core strength exercises today with fatigue noted.  Pt will continue to benefit from skilled PT for core strength, flexibility, hip strength and manual to address mobility.       Rehab Potential  Excellent    PT Frequency  2x / week    PT Duration  8 weeks    PT Treatment/Interventions  Cryotherapy;Electrical Stimulation;Moist Heat;Traction;Ultrasound;Therapeutic exercise;Therapeutic activities;Neuromuscular re-education;Patient/family education;Manual techniques;Dry needling    PT Next Visit Plan  abdominal bracing with exercise, lumbar strengthening    Consulted and Agree with Plan of Care  Patient       Patient will benefit from skilled therapeutic intervention in order to improve the following deficits and impairments:  Pain, Increased fascial restricitons, Decreased activity tolerance, Decreased strength, Increased muscle spasms, Decreased range of motion  Visit Diagnosis: Acute right-sided low back pain without sciatica  Muscle weakness (generalized)     Problem List Patient Active Problem List   Diagnosis Date Noted  . Osteopenia 03/13/2017  . Ocular migraine 03/13/2017  . OSA (obstructive sleep apnea) 12/21/2016  . IBS (irritable bowel syndrome) 09/12/2016  .  History of adenomatous polyp of colon 09/12/2016  . Former smoker 09/12/2016  . Carpal tunnel syndrome 09/04/2013  . Lower extremity neuropathy 08/23/2013  . Achilles tendon tear 01/17/2011  . Hyperlipemia 01/07/2010  . GASTROESOPHAGEAL REFLUX DISEASE 06/19/2009  .  ACNE ROSACEA 04/03/2008  . Essential tremor 11/16/2007  . Essential hypertension 11/16/2007     Sigurd Sos, PT 04/03/18 12:25 PM  [facility] [address] [place], [place], [address] Phone: [phone]   Fax:  [phone]  Name: Adriana Hendricks MRN: [id number] Date of Birth: 1935-04-27

## 2018-04-09 ENCOUNTER — Ambulatory Visit: Payer: Medicare Other | Admitting: Physical Therapy

## 2018-04-09 ENCOUNTER — Encounter: Payer: Self-pay | Admitting: Physical Therapy

## 2018-04-09 DIAGNOSIS — M545 Low back pain, unspecified: Secondary | ICD-10-CM

## 2018-04-09 DIAGNOSIS — M6281 Muscle weakness (generalized): Secondary | ICD-10-CM

## 2018-04-09 NOTE — Therapy (Signed)
[facility] [address] [place], [place], [address] Phone: [phone]   Fax:  [phone]  Physical Therapy Treatment  Patient Details  Name: Adriana Hendricks MRN: [id number] Date of Birth: 09/02/1935 Referring Provider: Dr. Narda Amber   Encounter Date: 04/09/2018  PT End of Session - 04/09/18 1456    Visit Number  6    Date for PT Re-Evaluation  05/02/18    Authorization Type  Medicare BCBS federal    PT Start Time  1445    PT Stop Time  1525    PT Time Calculation (min)  40 min    Activity Tolerance  Patient tolerated treatment well    Behavior During Therapy  [facility] for tasks assessed/performed       Past Medical History:  Diagnosis Date  . Achilles tendon rupture   . Arthritis of hand, right   . Carpal tunnel syndrome 09/04/2013  . Collagenous colitis 2005  . Colon polyps 2010   Tubular adenoma and hyperplastic  . Dental infection 10/25/2014  . Diverticulosis   . Esophageal stenosis   . Essential tremor   . GERD (gastroesophageal reflux disease)    hx hiatal hernia, hx esophagitis, hx stricture  . Heart murmur   . Hiatal hernia   . Hx: UTI (urinary tract infection)   . Hypertension   . Lower extremity neuropathy 08/23/2013   On B12 therapy with numbness in the feet bilaterally no evidence of diabetes   . Ocular migraine    jagged vision, a few per month  . OSA (obstructive sleep apnea) 12/21/2016  . Peripheral neuropathy    treated by Dr Posey Pronto (07/2015)    Past Surgical History:  Procedure Laterality Date  . APPENDECTOMY    . CARPAL TUNNEL RELEASE    . CATARACT EXTRACTION    . EXCISION MORTON'S NEUROMA     Right foot  . INTRAOCULAR LENS IMPLANT, SECONDARY    . Moles removed    . MOUTH SURGERY     (For Exostosis)  . ROTATOR CUFF REPAIR    . TONSILLECTOMY      There were no vitals filed for this visit.      [facility] PT Assessment - 04/09/18 0001      Sensation   Additional Comments   bil. patella and gastroc reflex +2      Strength   Right Hip ABduction  4/5 no pain in back; bil. toe extensor strength 5/5                   [facility] Adult PT Treatment/Exercise - 04/09/18 0001      Lumbar Exercises: Standing   Other Standing Lumbar Exercises  tandem stand and move band across body for trunk strength and balance;     Other Standing Lumbar Exercises  standing on rocker board keeping balance with eyes open and closed to work trunk and balance.       Lumbar Exercises: Supine   Ab Set  10 reps;5 seconds    Bent Knee Raise  10 reps with abdominal bracing    Bridge with clamshell  10 reps;1 second use yellow band    Straight Leg Raise  20 reps VC for abdominal bracing      Manual Therapy   Manual Therapy  Neural Stretch    Neural Stretch  to left leg for all nerves in supine and prone             PT  Education - 04/09/18 1504    Education provided  Yes    Education Details  Access Code: [email or web address]    Person(s) Educated  Patient    Methods  Explanation;Demonstration;Verbal cues;Handout    Comprehension  Returned demonstration;Verbalized understanding       PT Short Term Goals - 03/29/18 1256      PT SHORT TERM GOAL #2   Title  ability to sit for 30 minutes with good posture and pain decreased >/= 25%    Baseline  needs chair with good support    Time  4    Period  Weeks    Status  On-going        PT Long Term Goals - 03/07/18 1701      PT LONG TERM GOAL #1   Title  be independent in advanced HEP    Time  8    Period  Weeks    Status  New    Target Date  05/02/18      PT LONG TERM GOAL #2   Title  able to perform daily tasks with lumbar pain decreased >/= 50% due to improve trunk strength    Time  8    Period  Weeks    Status  New    Target Date  05/02/18      PT LONG TERM GOAL #3   Title  able to sit for 45 minutes with back pain decreased >/= 50%    Time  8    Period  Weeks    Status  New    Target Date  05/02/18      PT LONG  TERM GOAL #4   Title  able to stand for 15 minutes in a store with pain level decreased >/= 50%    Time  8    Period  Weeks    Status  New    Target Date  05/02/18      PT LONG TERM GOAL #5   Title  FOTO score </= 25% limitation    Time  8    Period  Weeks    Status  New    Target Date  05/02/18            Plan - 04/09/18 1504    Clinical Impression Statement  Patient able to perform core strength in supine with no verbal cues so she will do at home.  Patient strength of right hip abduction is 4/5.  Patient reflexes for quad tendon and heelcord is 2+.  Patient reports she continues to have numbness in left thigh and a new feeling of a hair wraped around her big toe.  Left toe extensor strength is 5/5.  Patient will need skilled therapy to improve core strength with balance exercises and reduction of pain to improve daily activities.     Rehab Potential  Excellent    Clinical Impairments Affecting Rehab Potential  lower extremity neuropathy    PT Frequency  2x / week    PT Duration  8 weeks    PT Treatment/Interventions  Cryotherapy;Electrical Stimulation;Moist Heat;Traction;Ultrasound;Therapeutic exercise;Therapeutic activities;Neuromuscular re-education;Patient/family education;Manual techniques;Dry needling    PT Next Visit Plan  abdominal bracing with exercise, lumbar strengthening    PT Home Exercise Plan  Access Code: [email or web address]    Consulted and Agree with Plan of Care  Patient       Patient will benefit from skilled therapeutic intervention in order to improve the following deficits and impairments:  Pain, Increased fascial restricitons,  Decreased activity tolerance, Decreased strength, Increased muscle spasms, Decreased range of motion  Visit Diagnosis: Acute right-sided low back pain without sciatica  Muscle weakness (generalized)     Problem List Patient Active Problem List   Diagnosis Date Noted  . Osteopenia 03/13/2017  . Ocular migraine 03/13/2017  . OSA  (obstructive sleep apnea) 12/21/2016  . IBS (irritable bowel syndrome) 09/12/2016  . History of adenomatous polyp of colon 09/12/2016  . Former smoker 09/12/2016  . Carpal tunnel syndrome 09/04/2013  . Lower extremity neuropathy 08/23/2013  . Achilles tendon tear 01/17/2011  . Hyperlipemia 01/07/2010  . GASTROESOPHAGEAL REFLUX DISEASE 06/19/2009  . ACNE ROSACEA 04/03/2008  . Essential tremor 11/16/2007  . Essential hypertension 11/16/2007    Earlie Counts, PT 04/09/18 3:29 PM   [facility] [address] [place], [place], [address] Phone: [phone]   Fax:  [phone]  Name: Adriana Hendricks MRN: [id number] Date of Birth: 22-Oct-1935

## 2018-04-09 NOTE — Patient Instructions (Signed)
Access Code: [email or web address]  URL: [email or web address]  Date: 04/09/2018  Prepared by: Earlie Counts   Exercises Supine Transversus Abdominis Bracing with Double Leg Fallout - 10 reps - 1 sets - 5 hold - 1x daily - 7x weekly Hooklying Sequential Leg March and Lower - 10 reps - 2 sets - 2 hold - 1x daily - 7x weekly Supine Transversus Abdominis Bracing with Leg Extension - 10 reps - 1 sets - 2 hold - 1x daily - 7x weekly Clam with Resistance - 10 reps - 2 sets - 1x daily  [facility] [facility] [address], [place], [place] [address] Phone # [phone] Fax [phone]

## 2018-04-18 ENCOUNTER — Ambulatory Visit: Payer: Medicare Other

## 2018-04-24 ENCOUNTER — Ambulatory Visit: Payer: Medicare Other | Admitting: Physical Therapy

## 2018-04-24 ENCOUNTER — Encounter: Payer: Self-pay | Admitting: Physical Therapy

## 2018-04-24 DIAGNOSIS — M545 Low back pain, unspecified: Secondary | ICD-10-CM

## 2018-04-24 DIAGNOSIS — M6281 Muscle weakness (generalized): Secondary | ICD-10-CM | POA: Diagnosis not present

## 2018-04-24 NOTE — Patient Instructions (Signed)
Walk 30 minutes per day  No sitting more than 45 minutes at the computer and watching TV.   [facility] [address] [place], [place] [address] Phone # [phone] Fax [phone]

## 2018-04-24 NOTE — Therapy (Signed)
[facility] [address] [place], [place], [address] Phone: [phone]   Fax:  [phone]  Physical Therapy Treatment  Patient Details  Name: Adriana Hendricks MRN: [id number] Date of Birth: 11-06-1935 Referring Provider: Dr. Narda Amber   Encounter Date: 04/24/2018  PT End of Session - 04/24/18 1142    Visit Number  7    Date for PT Re-Evaluation  05/02/18    Authorization Type  Medicare BCBS federal    PT Start Time  1100    PT Stop Time  1140    PT Time Calculation (min)  40 min    Activity Tolerance  Patient tolerated treatment well    Behavior During Therapy  [facility] for tasks assessed/performed       Past Medical History:  Diagnosis Date  . Achilles tendon rupture   . Arthritis of hand, right   . Carpal tunnel syndrome 09/04/2013  . Collagenous colitis 2005  . Colon polyps 2010   Tubular adenoma and hyperplastic  . Dental infection 10/25/2014  . Diverticulosis   . Esophageal stenosis   . Essential tremor   . GERD (gastroesophageal reflux disease)    hx hiatal hernia, hx esophagitis, hx stricture  . Heart murmur   . Hiatal hernia   . Hx: UTI (urinary tract infection)   . Hypertension   . Lower extremity neuropathy 08/23/2013   On B12 therapy with numbness in the feet bilaterally no evidence of diabetes   . Ocular migraine    jagged vision, a few per month  . OSA (obstructive sleep apnea) 12/21/2016  . Peripheral neuropathy    treated by Dr Posey Pronto (07/2015)    Past Surgical History:  Procedure Laterality Date  . APPENDECTOMY    . CARPAL TUNNEL RELEASE    . CATARACT EXTRACTION    . EXCISION MORTON'S NEUROMA     Right foot  . INTRAOCULAR LENS IMPLANT, SECONDARY    . Moles removed    . MOUTH SURGERY     (For Exostosis)  . ROTATOR CUFF REPAIR    . TONSILLECTOMY      There were no vitals filed for this visit.  Subjective Assessment - 04/24/18 1114    Subjective  I felt the same after the the  last visit.  I pulled a muscle in my back 2 days after I was in therapy, 04/11/2018.  I have more intensity of the numbness and creepy crawly feeling in the left leg. I had trouble to put weight on right leg due to the muscle pain in right side.     Patient Stated Goals  get rid of pain and numbness    Currently in Pain?  Yes    Pain Score  1     Pain Location  Back    Pain Orientation  Lower;Right    Pain Descriptors / Indicators  Numbness left leg numbness    Pain Type  Acute pain    Pain Radiating Towards  less in right leg    Pain Onset  1 to 4 weeks ago    Pain Frequency  Intermittent    Aggravating Factors   being upright, sitting, standing    Pain Relieving Factors  laying down    Multiple Pain Sites  No         [facility] PT Assessment - 04/24/18 0001      Observation/Other Assessments   Focus on Therapeutic Outcomes (FOTO)   35% limitation goal is 25%  limitation                   [facility] Adult PT Treatment/Exercise - 04/24/18 0001      Self-Care   Self-Care  Other Self-Care Comments    Other Self-Care Comments   no sitting more than 45 minutes, walking for 30 minutes per day      Therapeutic Activites    Therapeutic Activities  ADL's    ADL's  rolling in bed  and getting in and out of bed with abdominal bracing and breathing out      Lumbar Exercises: Aerobic   Nustep  Level 2x 8 minutes seat #8, arm #10 while discuss progress      Lumbar Exercises: Supine   Ab Set  10 reps;5 seconds    Bent Knee Raise  20 reps with abdominal bracing    Bridge with clamshell  10 reps;1 second use yellow band; supine    Straight Leg Raise  20 reps VC for abdominal bracing      Lumbar Exercises: Sidelying   Clam  Left;Right;10 reps;1 second yellow band      Manual Therapy   Manual Therapy  Neural Stretch    Neural Stretch  to left leg for all nerves in supine and prone             PT Education - 04/24/18 1142    Education provided  Yes    Education Details   walking program, no sitting more than 45 minutes    Methods  Explanation;Handout    Comprehension  Verbalized understanding       PT Short Term Goals - 04/24/18 1128      PT SHORT TERM GOAL #2   Title  ability to sit for 30 minutes with good posture and pain decreased >/= 25%    Baseline  needs chair with good support, and if she started with no pain    Time  4    Period  Weeks    Status  Achieved        PT Long Term Goals - 03/07/18 1701      PT LONG TERM GOAL #1   Title  be independent in advanced HEP    Time  8    Period  Weeks    Status  New    Target Date  05/02/18      PT LONG TERM GOAL #2   Title  able to perform daily tasks with lumbar pain decreased >/= 50% due to improve trunk strength    Time  8    Period  Weeks    Status  New    Target Date  05/02/18      PT LONG TERM GOAL #3   Title  able to sit for 45 minutes with back pain decreased >/= 50%    Time  8    Period  Weeks    Status  New    Target Date  05/02/18      PT LONG TERM GOAL #4   Title  able to stand for 15 minutes in a store with pain level decreased >/= 50%    Time  8    Period  Weeks    Status  New    Target Date  05/02/18      PT LONG TERM GOAL #5   Title  FOTO score </= 25% limitation    Time  8    Period  Weeks  Status  New    Target Date  05/02/18            Plan - 04/24/18 1142    Clinical Impression Statement  Patient is till have pain in right side and numbness in left leg.  Patient reports she strained her back muscle on 04/10/2018 and has not done her exercises.  Patient was able to do her exercises in therapy without increased pain.  Patient sits 8 hours plus per day at the computer and during watching TV.  Patient needs verbal cues to get up from prone and other activities.  Patient will benefi tfrom skilled threapy to improve daily activities and reduction of pain.     Rehab Potential  Excellent    Clinical Impairments Affecting Rehab Potential  lower extremity  neuropathy    PT Frequency  2x / week    PT Duration  8 weeks    PT Treatment/Interventions  Cryotherapy;Electrical Stimulation;Moist Heat;Traction;Ultrasound;Therapeutic exercise;Therapeutic activities;Neuromuscular re-education;Patient/family education;Manual techniques;Dry needling    PT Next Visit Plan  abdominal bracing with exercise, lumbar strengthening; determine if she is continue or go back to doctor; work on Chemical [employer] and Agree with Plan of Care  Patient       Patient will benefit from skilled therapeutic intervention in order to improve the following deficits and impairments:  Pain, Increased fascial restricitons, Decreased activity tolerance, Decreased strength, Increased muscle spasms, Decreased range of motion  Visit Diagnosis: Acute right-sided low back pain without sciatica  Muscle weakness (generalized)     Problem List Patient Active Problem List   Diagnosis Date Noted  . Osteopenia 03/13/2017  . Ocular migraine 03/13/2017  . OSA (obstructive sleep apnea) 12/21/2016  . IBS (irritable bowel syndrome) 09/12/2016  . History of adenomatous polyp of colon 09/12/2016  . Former smoker 09/12/2016  . Carpal tunnel syndrome 09/04/2013  . Lower extremity neuropathy 08/23/2013  . Achilles tendon tear 01/17/2011  . Hyperlipemia 01/07/2010  . GASTROESOPHAGEAL REFLUX DISEASE 06/19/2009  . ACNE ROSACEA 04/03/2008  . Essential tremor 11/16/2007  . Essential hypertension 11/16/2007    Earlie Counts, PT 04/24/18 11:46 AM   [facility] [address], [place], [place], [address] Phone: [phone]   Fax:  [phone]  Name: Adriana Hendricks MRN: [id number] Date of Birth: 06-26-1935

## 2018-05-01 ENCOUNTER — Ambulatory Visit: Payer: Medicare Other | Attending: Neurology

## 2018-05-01 DIAGNOSIS — M6281 Muscle weakness (generalized): Secondary | ICD-10-CM | POA: Insufficient documentation

## 2018-05-01 DIAGNOSIS — M545 Low back pain, unspecified: Secondary | ICD-10-CM

## 2018-05-01 NOTE — Therapy (Signed)
[facility] [address] [place], [place], [address] Phone: [phone]   Fax:  [phone]  Physical Therapy Treatment  Patient Details  Name: Adriana Hendricks MRN: [id number] Date of Birth: 05-Jan-1935 Referring Provider: Dr. Narda Amber   Encounter Date: 05/01/2018  PT End of Session - 05/01/18 1131    Visit Number  8    Authorization Type  Medicare BCBS federal    PT Start Time  5102    PT Stop Time  1135    PT Time Calculation (min)  44 min    Activity Tolerance  Patient tolerated treatment well    Behavior During Therapy  [facility] for tasks assessed/performed       Past Medical History:  Diagnosis Date  . Achilles tendon rupture   . Arthritis of hand, right   . Carpal tunnel syndrome 09/04/2013  . Collagenous colitis 2005  . Colon polyps 2010   Tubular adenoma and hyperplastic  . Dental infection 10/25/2014  . Diverticulosis   . Esophageal stenosis   . Essential tremor   . GERD (gastroesophageal reflux disease)    hx hiatal hernia, hx esophagitis, hx stricture  . Heart murmur   . Hiatal hernia   . Hx: UTI (urinary tract infection)   . Hypertension   . Lower extremity neuropathy 08/23/2013   On B12 therapy with numbness in the feet bilaterally no evidence of diabetes   . Ocular migraine    jagged vision, a few per month  . OSA (obstructive sleep apnea) 12/21/2016  . Peripheral neuropathy    treated by Dr Posey Pronto (07/2015)    Past Surgical History:  Procedure Laterality Date  . APPENDECTOMY    . CARPAL TUNNEL RELEASE    . CATARACT EXTRACTION    . EXCISION MORTON'S NEUROMA     Right foot  . INTRAOCULAR LENS IMPLANT, SECONDARY    . Moles removed    . MOUTH SURGERY     (For Exostosis)  . ROTATOR CUFF REPAIR    . TONSILLECTOMY      There were no vitals filed for this visit.  Subjective Assessment - 05/01/18 1103    Subjective  I feel like I am making progress.  50-75% overall improvement in Lt LE  symptoms.      Currently in Pain?  Yes    Pain Score  0-No pain    Pain Location  Back    Pain Orientation  Left;Lower    Pain Descriptors / Indicators  Numbness    Pain Type  Acute pain    Pain Onset  More than a month ago    Pain Frequency  Intermittent    Aggravating Factors   standing at kitchen sink, sitting too long    Pain Relieving Factors  laying down, change of position         [facility] PT Assessment - 05/01/18 0001      Assessment   Medical Diagnosis  R27.8 Sensory ataxia; M54.16 Lumbar radiculopathy      Observation/Other Assessments   Focus on Therapeutic Outcomes (FOTO)   35% limitation goal is 25% limitation                   [facility] Adult PT Treatment/Exercise - 05/01/18 0001      Lumbar Exercises: Aerobic   Nustep  Level 2x 8 minutes seat #8, arm #10 while discuss progress      Lumbar Exercises: Supine   Ab Set  10  reps;5 seconds    Bent Knee Raise  20 reps with abdominal bracing      Knee/Hip Exercises: Stretches   Active Hamstring Stretch  Both;2 reps;20 seconds    Piriformis Stretch  Both;3 reps;20 seconds    Other Knee/Hip Stretches  knee to chest 3x20" bil.       Manual Therapy   Manual Therapy  Neural Stretch    Neural Stretch  to left leg for all nerves in supine and prone               PT Short Term Goals - 04/24/18 1128      PT SHORT TERM GOAL #2   Title  ability to sit for 30 minutes with good posture and pain decreased >/= 25%    Baseline  needs chair with good support, and if she started with no pain    Time  4    Period  Weeks    Status  Achieved        PT Long Term Goals - 05/01/18 1103      PT LONG TERM GOAL #2   Title  able to perform daily tasks with lumbar pain decreased >/= 50% due to improve trunk strength    Baseline  50-75% reported    Status  Achieved      PT LONG TERM GOAL #3   Title  able to sit for 45 minutes with back pain decreased >/= 50%    Baseline  no pain with sitting.  10-15 minutes max  before LBP occurs upon standing    Status  Partially Met      PT LONG TERM GOAL #4   Title  able to stand for 15 minutes in a store with pain level decreased >/= 50%    Status  Achieved      PT LONG TERM GOAL #5   Title  FOTO score </= 25% limitation    Baseline  35% limitation    Status  Partially Met            Plan - 05/01/18 1109    Clinical Impression Statement  Pt reports 50-75% overall improvement since the start of care.  Pt is independent and compliant in HEP for core strength and flexibility.  Pt is trying to take more breaks when she sits at the computer and do intermittent stretching during this activity. Pt will D/C today and follow up with MD as needed.      PT Next Visit Plan  D/C PT to HEP    PT Home Exercise Plan  Access Code: [email or web address]    Consulted and Agree with Plan of Care  Patient       Patient will benefit from skilled therapeutic intervention in order to improve the following deficits and impairments:     Visit Diagnosis: Acute right-sided low back pain without sciatica  Muscle weakness (generalized)     Problem List Patient Active Problem List   Diagnosis Date Noted  . Osteopenia 03/13/2017  . Ocular migraine 03/13/2017  . OSA (obstructive sleep apnea) 12/21/2016  . IBS (irritable bowel syndrome) 09/12/2016  . History of adenomatous polyp of colon 09/12/2016  . Former smoker 09/12/2016  . Carpal tunnel syndrome 09/04/2013  . Lower extremity neuropathy 08/23/2013  . Achilles tendon tear 01/17/2011  . Hyperlipemia 01/07/2010  . GASTROESOPHAGEAL REFLUX DISEASE 06/19/2009  . ACNE ROSACEA 04/03/2008  . Essential tremor 11/16/2007  . Essential hypertension 11/16/2007   PHYSICAL THERAPY DISCHARGE SUMMARY  Visits  from Start of Care: 8  Current functional level related to goals / functional outcomes: See above for current status.     Remaining deficits: Lt LE radiculopathy remains although improved in frequency and intensity.  Pt has HEP  in place for continued gains.     Education / Equipment: HEP, posture/body mechanics Plan: Patient agrees to discharge.  Patient goals were partially met. Patient is being discharged due to being pleased with the current functional level.  ?????        Sigurd Sos, PT 05/01/18 11:40 AM  [facility] [address] [place], [place], [address] Phone: [phone]   Fax:  [phone]  Name: Adriana Hendricks MRN: [id number] Date of Birth: [birth year]

## 2018-07-18 ENCOUNTER — Encounter: Payer: Self-pay | Admitting: Pulmonary Disease

## 2018-07-18 ENCOUNTER — Ambulatory Visit (INDEPENDENT_AMBULATORY_CARE_PROVIDER_SITE_OTHER): Payer: Medicare Other | Admitting: Pulmonary Disease

## 2018-07-18 VITALS — BP 120/74 | HR 74 | Ht 65.0 in | Wt 170.8 lb

## 2018-07-18 DIAGNOSIS — I4891 Unspecified atrial fibrillation: Secondary | ICD-10-CM | POA: Diagnosis not present

## 2018-07-18 DIAGNOSIS — J310 Chronic rhinitis: Secondary | ICD-10-CM

## 2018-07-18 DIAGNOSIS — G4733 Obstructive sleep apnea (adult) (pediatric): Secondary | ICD-10-CM

## 2018-07-18 DIAGNOSIS — R0602 Shortness of breath: Secondary | ICD-10-CM | POA: Diagnosis not present

## 2018-07-18 NOTE — Progress Notes (Signed)
[facility]  Chief Complaint  Patient presents with  . Follow-up    pt wearing cpap avg 7hr nightly, feels pressure may be too strong.    Constitutional: BP 120/74 (BP Location: Left Arm, Cuff Size: Normal)   Pulse 74   Ht 5\' 5"  (1.651 m)   Wt 170 lb 12.8 oz (77.5 kg)   SpO2 97%   BMI 28.42 kg/m   History of Present Illness: Adriana Hendricks is a 83 y.o. female former smoker with obstructive sleep apnea.  She uses CPAP nightly.  Has nasal mask.  Gets sinus congestion, and ear congestion.  Tried full face mask before, but had air leak.  She had to dial her humidifier down to make air cooler.  For the past year she has noticed more trouble with her breathing when she does activity.  She has a hard time keeping up with her husband now.  She gets chest tightness at times when the weather is worse.  Not having cough, wheeze, skin rash, joint swelling, gland swelling, or leg swelling.   Comprehensive Respiratory Exam:  Appearance - well kempt  ENMT - nasal mucosa moist, turbinates clear, midline nasal septum, no dental lesions, no gingival bleeding, no oral exudates, no tonsillar hypertrophy Neck - no masses, trachea midline, no thyromegaly, no elevation in JVP Respiratory - normal appearance of chest wall, normal respiratory effort w/o accessory muscle use, no dullness on percussion, no wheezing or rales CV - s1s2 irregular, no murmurs, no peripheral edema, no varicosities, radial pulses symmetric GI - soft, non tender, no masses Lymph - no adenopathy noted in neck and axillary areas MSK - normal muscle strength and tone, normal gait Ext - no cyanosis, clubbing, or joint inflammation noted Skin - no rashes, lesions, or ulcers Neuro - oriented to person, place, and time Psych - normal mood and affect   Assessment/Plan:  Obstructive sleep apnea. - she is compliant with CPAP and reports benefit  CPAP rhinitis. - will try changing to auto  CPAP 5 to 8 cm H2O - adjust humidifier setting - try flonase  - if still continues, then might need to change mask  Shortness of breath on exertion. - ECG shows slow atrial fibrillation - will arrange referral to cardiology   Patient Instructions  Will arrange for referral to Cardiology to assess atrial fibrillation and shortness of breath  Will have your CPAP change to 5 to 8 cm H2O  Can try flonase 1 spary each nostril daily for sinus congestion  Follow up in 1 year    Chesley Mires, MD [place] 07/18/2018, 11:55 AM  Flow Sheet  Pulmonary tests:  Sleep tests: HST 12/14/16 >> AHI 19.5, SaO2 low 78% Auto CPAP 06/18/18 to 07/17/18 >> used on 30 of 30 nights with average 7 hrs 48 min.  Average AHI 0.2 with median CPAP 8 and 95 th percentile CPAP 9 cm H2O  Cardiac tests:  Past Medical History: She  has a past medical history of Achilles tendon rupture, Arthritis of hand, right, Carpal tunnel syndrome (09/04/2013), Collagenous colitis (2005), Colon polyps (2010), Dental infection (10/25/2014), Diverticulosis, Esophageal stenosis, Essential tremor, GERD (gastroesophageal reflux disease), Heart murmur, Hiatal hernia, UTI (urinary tract infection), Hypertension, Lower extremity neuropathy (08/23/2013), Ocular migraine, OSA (obstructive sleep apnea) (12/21/2016), and Peripheral neuropathy.  Past Surgical History: She  has a past surgical history that includes Appendectomy; Tonsillectomy; Cataract extraction; Moles removed; Excision Morton's neuroma; Mouth surgery; Intraocular lens implant, secondary; Carpal  tunnel release; and Rotator cuff repair.  Family History: Her family history includes Barrett's esophagus in her son; Heart failure in her father; Other in her mother; Rectal cancer in her father.  Social History: She  reports that she quit smoking about 50 years ago. Her smoking use included cigarettes. She started smoking about 63 years ago. She has a 26.00  pack-year smoking history. She has never used smokeless tobacco. She reports that she drinks about 0.6 oz of alcohol per week. She reports that she does not use drugs.  Medications: Allergies as of 07/18/2018      Reactions   Clarithromycin    REACTION: nausea   Erythromycin Base Nausea And Vomiting   Levofloxacin    Patient has a history of a Achilles tendon tear and developed tendon pain while on medication   Metronidazole    REACTION: Rash   Penicillins    Rash      Medication List        Accurate as of 07/18/18 11:55 AM. Always use your most recent med list.          aspirin EC 81 MG tablet Take 81 mg by mouth daily.   calcium carbonate 500 MG chewable tablet Commonly known as:  TUMS - dosed in mg elemental calcium Chew 2 tablets by mouth daily.   ICAPS AREDS 2 Caps Take 1 capsule by mouth daily.   nadolol-bendroflumethiazide 40-5 MG tablet Commonly known as:  CORZIDE TAKE 1 TABLET DAILY   ranitidine 300 MG capsule Commonly known as:  ZANTAC Take 1 capsule (300 mg total) by mouth 2 (two) times daily.   Turmeric 500 MG Caps Take 1 capsule by mouth daily.   Vitamin D3 1000 units Caps Take 1 capsule by mouth daily.

## 2018-07-18 NOTE — Patient Instructions (Addendum)
Will arrange for referral to Cardiology to assess atrial fibrillation and shortness of breath  Will have your CPAP change to 5 to 8 cm H2O  Can try flonase 1 spary each nostril daily for sinus congestion  Follow up in 1 year

## 2018-07-26 ENCOUNTER — Encounter: Payer: Self-pay | Admitting: Cardiology

## 2018-07-26 ENCOUNTER — Ambulatory Visit (INDEPENDENT_AMBULATORY_CARE_PROVIDER_SITE_OTHER): Payer: Medicare Other | Admitting: Cardiology

## 2018-07-26 VITALS — BP 138/79 | HR 83 | Ht 65.0 in | Wt 168.8 lb

## 2018-07-26 DIAGNOSIS — I482 Chronic atrial fibrillation, unspecified: Secondary | ICD-10-CM | POA: Insufficient documentation

## 2018-07-26 DIAGNOSIS — E782 Mixed hyperlipidemia: Secondary | ICD-10-CM | POA: Diagnosis not present

## 2018-07-26 DIAGNOSIS — I10 Essential (primary) hypertension: Secondary | ICD-10-CM

## 2018-07-26 DIAGNOSIS — I4891 Unspecified atrial fibrillation: Secondary | ICD-10-CM

## 2018-07-26 DIAGNOSIS — I071 Rheumatic tricuspid insufficiency: Secondary | ICD-10-CM

## 2018-07-26 DIAGNOSIS — R0609 Other forms of dyspnea: Secondary | ICD-10-CM | POA: Insufficient documentation

## 2018-07-26 HISTORY — DX: Rheumatic tricuspid insufficiency: I07.1

## 2018-07-26 HISTORY — PX: TRANSTHORACIC ECHOCARDIOGRAM: SHX275

## 2018-07-26 HISTORY — PX: OTHER SURGICAL HISTORY: SHX169

## 2018-07-26 NOTE — Progress Notes (Signed)
PCP: Marin Olp, MD  Clinic Note: Chief Complaint  Patient presents with  . New Patient (Initial Visit)    mild DOE  . Atrial Fibrillation    HPI: Adriana Hendricks is a 83 y.o. female (former smoker, with history of hypertension and OSA on CPAP) who is being seen today for the evaluation of SHORTNESS OF BREATH AND ATRIAL FIBRILLATION At the request of Chesley Mires, MD.    Adriana Hendricks was last seen on July 24 by Dr. Halford Chessman for routine her OSA.  She has been noticing worsening exertional dyspnea over the last year or so.  She also noted having some chest tightness at times when the weather is bad.  Recent Hospitalizations: None  Studies Personally Reviewed - (if available, images/films reviewed: From Epic Chart or Care Everywhere)  None  Interval History: Adriana Hendricks presents here today with her husband.  But she mostly notes to me is that she has noted worsening exertional dyspnea, especially over the summer not is gotten hot.  I asked her about any chest discomfort and tightness, she has not had any recently, but does recall but it was colder having some tightness her chest with breathing, this is more associated with recent wheezing. She has had a little bit of dizziness, but not really any sensation of rapid or irregular heartbeats --she says she had a "irregular heartbeat for the last 30 to 40 years, but nothing is ever been diagnosed so she never thought much of it.,  No PND, orthopnea or edema. No chest discomfort or dyspnea at rest.  She notes that she drinks lots of water to stay hydrated. She is also been told that she has a functional heart murmur worse with lying down.  This is never been given a clear diagnosis either.   No lightheadedness, dizziness, weakness or syncope/near syncope. No TIA/amaurosis fugax symptoms. No melena, hematochezia, hematuria, or epstaxis. No claudication.  ROS: A comprehensive was performed. Review of Systems  Constitutional: Negative for  malaise/fatigue and weight loss.  HENT: Negative for congestion and nosebleeds.   Respiratory: Negative for cough, shortness of breath and wheezing.   Cardiovascular: Positive for palpitations.  Gastrointestinal: Negative for abdominal pain, constipation and heartburn.  Musculoskeletal: Positive for back pain.  Neurological: Negative for dizziness, focal weakness and headaches.  Endo/Heme/Allergies: Does not bruise/bleed easily.  Psychiatric/Behavioral: Negative.   All other systems reviewed and are negative.   I have reviewed and (if needed) personally updated the patient's problem list, medications, allergies, past medical and surgical history, social and family history.   Past Medical History:  Diagnosis Date  . Achilles tendon rupture   . Arthritis of hand, right   . Carpal tunnel syndrome 09/04/2013  . Collagenous colitis 2005  . Colon polyps 2010   Tubular adenoma and hyperplastic  . Dental infection 10/25/2014  . Diverticulosis   . Esophageal stenosis   . Essential tremor   . GERD (gastroesophageal reflux disease)    hx hiatal hernia, hx esophagitis, hx stricture  . Heart murmur   . Hiatal hernia   . Hx: UTI (urinary tract infection)   . Hypertension   . Lower extremity neuropathy 08/23/2013   On B12 therapy with numbness in the feet bilaterally no evidence of diabetes   . Ocular migraine    jagged vision, a few per month  . OSA (obstructive sleep apnea) 12/21/2016   on CPAP  . PAF (paroxysmal atrial fibrillation) ([place]) 06/2018   NEWLY DIAGNOSED 07/18/2018  .  Peripheral neuropathy    treated by Dr Posey Pronto (07/2015)    Past Surgical History:  Procedure Laterality Date  . APPENDECTOMY    . CARPAL TUNNEL RELEASE    . CATARACT EXTRACTION    . EXCISION MORTON'S NEUROMA     Right foot  . INTRAOCULAR LENS IMPLANT, SECONDARY    . Moles removed    . MOUTH SURGERY     (For Exostosis)  . ROTATOR CUFF REPAIR    . TONSILLECTOMY      Current Meds  Medication Sig  .  aspirin EC 81 MG tablet Take 81 mg by mouth daily.  . calcium carbonate (TUMS - DOSED IN MG ELEMENTAL CALCIUM) 500 MG chewable tablet Chew 2 tablets by mouth daily.  . Cholecalciferol (VITAMIN D3) 1000 units CAPS Take 1 capsule by mouth daily.  . Multiple Vitamins-Minerals (ICAPS AREDS 2) CAPS Take 1 capsule by mouth daily.  . nadolol-bendroflumethiazide (CORZIDE) 40-5 MG tablet TAKE 1 TABLET DAILY  . ranitidine (ZANTAC) 300 MG capsule Take 1 capsule (300 mg total) by mouth 2 (two) times daily. (Patient taking differently: Take 300 mg by mouth every evening. )  . Turmeric 500 MG CAPS Take 1 capsule by mouth daily.    Allergies  Allergen Reactions  . Clarithromycin     REACTION: nausea  . Erythromycin Base Nausea And Vomiting  . Levofloxacin     Patient has a history of a Achilles tendon tear and developed tendon pain while on medication  . Metronidazole     REACTION: Rash  . Penicillins     Rash     Social History   Tobacco Use  . Smoking status: Former Smoker    Packs/day: 2.00    Years: 13.00    Pack years: 26.00    Types: Cigarettes    Start date: 12/26/1954    Last attempt to quit: 03/26/1968    Years since quitting: 50.3  . Smokeless tobacco: Never Used  . Tobacco comment: Quit in 1969 - smokeed 2-3PPD , was 3 PPD at her heaviest for about 3 years.   Substance Use Topics  . Alcohol use: Yes    Alcohol/week: 0.6 oz    Types: 1 Glasses of wine per week    Comment: 1 glass of wine per day  . Drug use: No   Social History   Social History Narrative   Married. Husband is patient of Dr. Yong Channel.    Married 1958. 2 children. 4 grandkids (3 girls, 1 boy).    Retired from [employer].   Hobbies: genealogy and DNA    family history includes Barrett's esophagus in her son; Heart failure in her father; Other in her mother; Rectal cancer in her father.  Wt Readings from Last 3 Encounters:  07/26/18 168 lb 12.8 oz (76.6 kg)  07/18/18 170 lb  12.8 oz (77.5 kg)  03/02/18 171 lb 8 oz (77.8 kg)    PHYSICAL EXAM BP 138/79   Pulse 83   Ht 5\' 5"  (1.651 m)   Wt 168 lb 12.8 oz (76.6 kg)   BMI 28.09 kg/m  Physical Exam  Constitutional: She is oriented to person, place, and time. She appears well-developed and well-nourished. No distress.  well-groomed.  Healthy-appearing  HENT:  Head: Normocephalic and atraumatic.  Mouth/Throat: No oropharyngeal exudate.  Eyes: Pupils are equal, round, and reactive to light. Conjunctivae and EOM are normal. No scleral icterus.  Neck: Normal range of motion. Neck supple. No hepatojugular  reflux and no JVD present. Carotid bruit is not present. No tracheal deviation present. No thyromegaly present.  Cardiovascular: Normal rate, intact distal pulses and normal pulses. An irregularly irregular rhythm present. PMI is not displaced. Exam reveals no gallop and no friction rub.  Murmur heard.  Medium-pitched harsh crescendo-decrescendo midsystolic murmur is present with a grade of 1/6 at the upper right sternal border. Pulmonary/Chest: Effort normal and breath sounds normal. No respiratory distress. She has no wheezes. She has no rales.  Abdominal: Soft. Bowel sounds are normal. She exhibits no distension. There is no tenderness. There is no rebound.  No HSM  Musculoskeletal: Normal range of motion. She exhibits no edema.  Lymphadenopathy:    She has no cervical adenopathy.  Neurological: She is alert and oriented to person, place, and time. No cranial nerve deficit.  Skin: Skin is warm and dry.  Psychiatric: She has a normal mood and affect. Her behavior is normal. Judgment and thought content normal.    Adult ECG Report  Rate: 83 ;  Rhythm: atrial fibrillation and  incomplete right bundle block with normal axis and durations.;   Narrative Interpretation: Abnormal because of A. fib   Other studies Reviewed: Additional studies/ records that were reviewed today include:  Recent Labs:  Due for PCP  eval with blood work in Sept Lab Results  Component Value Date   CREATININE 0.62 03/14/2017   BUN 16 03/14/2017   NA 132 (L) 03/14/2017   K 4.0 03/14/2017   CL 93 (L) 03/14/2017   CO2 31 03/14/2017     ASSESSMENT / PLAN: Problem List Items Addressed This Visit    DOE (dyspnea on exertion)    Worsening exertional dyspnea over the past year with some intermittent episodes of chest tightness mentioned to Dr. Halford Chessman. Need to exclude ischemia.  Plan: Transthoracic Echo & Lexiscan Myoview      Relevant Orders   ECHOCARDIOGRAM COMPLETE   MYOCARDIAL PERFUSION IMAGING   Essential hypertension (Chronic)    Borderline controlled on current dose of Corzide (nadolol-bendroflumethiazide). Continue BB for rate control       Hyperlipemia (Chronic)    Monitored by PCP - not currently on Statin.  Pending ischemic evaluation, may need to be more aggressive.      New onset atrial fibrillation ([place]) - Primary    Diagnosed A. fib, this may have been the palpitations of this is been having for years.  She has noted some exertional dyspnea and even some chest tightness.  We do not know much about her cardiac function. We talked about anticoagulation and stroke prevention with her CHA2DS2-VASc Score OF 3. -She would like to think about it before starting.-I gave her names of [place] medicationsher to discuss to see her back in follow-up.    PLAN:   Transthoracic Echocardiogram  48 hr monitor to determine Afib Burden (PAF vs. Persistent)  Myoview ST to exclude ischemic etiology (since she has had DOE & chest tightness noted)  Currently rate controlled with Nadolol - determine rate vs. Rhythm control based upon above evaluation  Discuss DOAC Rx in f/u      Relevant Orders   EKG 12-Lead (Completed)   Holter monitor - 48 hour      This patients CHA2DS2-VASc Score and unadjusted Ischemic Stroke Rate (% per year) is equal to 3.2 % stroke rate/year from a score of 3  Above score calculated as 1  point each if present [CHF, HTN, DM, Vascular=MI/PAD/Aortic Plaque, Age if 44-74, or Female]; 2  points each if present [Age > 75, or Stroke/TIA/TE]  I spent a total of 40 minutes with the patient and chart review. >  50% of the time was spent in direct patient consultation.   Current medicines are reviewed at length with the patient today.  (+/- concerns) "wants to think about DOAC" The following changes have been made:  none yet  Patient Instructions  Medication Instructions:   ELIQUIS OR XARELTO FOR ANTICOAGULATION   Testing/Procedures:  Your physician has requested that you have an echocardiogram. Echocardiography is a painless test that uses sound waves to create images of your heart. It provides your doctor with information about the size and shape of your heart and how well your heart's chambers and valves are working. This procedure takes approximately one hour. There are no restrictions for this procedure.   Your physician has recommended that you wear a 48 HOUR holter monitor. Holter monitors are medical devices that record the heart's electrical activity. Doctors most often use these monitors to diagnose arrhythmias. Arrhythmias are problems with the speed or rhythm of the heartbeat. The monitor is a small, portable device. You can wear one while you do your normal daily activities. This is usually used to diagnose what is causing palpitations/syncope (passing out).   Your physician has requested that you have en exercise stress myoview. For further information please visit [email or web address]. Please follow instruction sheet, as given.  TAKE ALL MEDICATIONS  Follow-Up:  Your physician recommends that you schedule a follow-up appointment in: [place] DR Taviana Westergren      Studies Ordered:   Orders Placed This Encounter  Procedures  . Holter monitor - 48 hour  . MYOCARDIAL PERFUSION IMAGING  . EKG 12-Lead  . ECHOCARDIOGRAM COMPLETE      Glenetta Hew, M.D.,  M.S. Interventional Cardiologist   Pager # [phone] Phone # [phone] [address] [place], [place] [address]   Thank you for choosing [facility]!!

## 2018-07-26 NOTE — Patient Instructions (Signed)
Medication Instructions:   ELIQUIS OR XARELTO FOR ANTICOAGULATION   Testing/Procedures:  Your physician has requested that you have an echocardiogram. Echocardiography is a painless test that uses sound waves to create images of your heart. It provides your doctor with information about the size and shape of your heart and how well your heart's chambers and valves are working. This procedure takes approximately one hour. There are no restrictions for this procedure.   Your physician has recommended that you wear a 48 HOUR holter monitor. Holter monitors are medical devices that record the heart's electrical activity. Doctors most often use these monitors to diagnose arrhythmias. Arrhythmias are problems with the speed or rhythm of the heartbeat. The monitor is a small, portable device. You can wear one while you do your normal daily activities. This is usually used to diagnose what is causing palpitations/syncope (passing out).   Your physician has requested that you have en exercise stress myoview. For further information please visit [email or web address]. Please follow instruction sheet, as given.  TAKE ALL MEDICATIONS  Follow-Up:  Your physician recommends that you schedule a follow-up appointment in: [place]

## 2018-07-28 ENCOUNTER — Encounter: Payer: Self-pay | Admitting: Cardiology

## 2018-07-28 NOTE — Assessment & Plan Note (Signed)
Borderline controlled on current dose of Corzide (nadolol-bendroflumethiazide). Continue BB for rate control

## 2018-07-28 NOTE — Assessment & Plan Note (Signed)
Diagnosed A. fib, this may have been the palpitations of this is been having for years.  She has noted some exertional dyspnea and even some chest tightness.  We do not know much about her cardiac function. We talked about anticoagulation and stroke prevention with her CHA2DS2-VASc Score OF 3. -She would like to think about it before starting.-I gave her names of [place] medicationsher to discuss to see her back in follow-up.    PLAN:   Transthoracic Echocardiogram  48 hr monitor to determine Afib Burden (PAF vs. Persistent)  Myoview ST to exclude ischemic etiology (since she has had DOE & chest tightness noted)  Currently rate controlled with Nadolol - determine rate vs. Rhythm control based upon above evaluation  Discuss DOAC Rx in f/u

## 2018-07-28 NOTE — Assessment & Plan Note (Signed)
Worsening exertional dyspnea over the past year with some intermittent episodes of chest tightness mentioned to Dr. Halford Chessman. Need to exclude ischemia.  Plan: Transthoracic Echo & Lexiscan Myoview

## 2018-07-28 NOTE — Assessment & Plan Note (Signed)
Monitored by PCP - not currently on Statin.  Pending ischemic evaluation, may need to be more aggressive.

## 2018-07-31 ENCOUNTER — Telehealth (HOSPITAL_COMMUNITY): Payer: Self-pay

## 2018-07-31 NOTE — Telephone Encounter (Signed)
Encounter complete. 

## 2018-08-01 ENCOUNTER — Ambulatory Visit (HOSPITAL_COMMUNITY)
Admission: RE | Admit: 2018-08-01 | Discharge: 2018-08-01 | Disposition: A | Discharge status: Home / Self Care | Payer: Medicare Other | Source: Ambulatory Visit | Attending: Internal Medicine | Admitting: Internal Medicine

## 2018-08-01 DIAGNOSIS — R0609 Other forms of dyspnea: Secondary | ICD-10-CM | POA: Diagnosis not present

## 2018-08-01 LAB — MYOCARDIAL PERFUSION IMAGING
CHL CUP RESTING HR STRESS: 93 {beats}/min
CSEPPHR: 100 {beats}/min
LV sys vol: 18 mL
LVDIAVOL: 61 mL (ref 46–106)
SDS: 0
SRS: 0
SSS: 0
TID: 0.9

## 2018-08-01 MED ORDER — TECHNETIUM TC 99M TETROFOSMIN IV KIT
11.0000 | PACK | Freq: Once | INTRAVENOUS | Status: AC | PRN
  Administered 2018-08-01: 11 via INTRAVENOUS
  Filled 2018-08-01: qty 11

## 2018-08-01 MED ORDER — REGADENOSON 0.4 MG/5ML IV SOLN
0.4000 mg | Freq: Once | INTRAVENOUS | Status: AC
  Administered 2018-08-01: 0.4 mg via INTRAVENOUS

## 2018-08-01 MED ORDER — TECHNETIUM TC 99M TETROFOSMIN IV KIT
30.4000 | PACK | Freq: Once | INTRAVENOUS | Status: AC | PRN
  Administered 2018-08-01: 30.4 via INTRAVENOUS
  Filled 2018-08-01: qty 31

## 2018-08-02 ENCOUNTER — Other Ambulatory Visit: Payer: Self-pay

## 2018-08-02 ENCOUNTER — Ambulatory Visit (HOSPITAL_COMMUNITY): Payer: Medicare Other | Attending: Cardiovascular Disease

## 2018-08-02 ENCOUNTER — Ambulatory Visit (INDEPENDENT_AMBULATORY_CARE_PROVIDER_SITE_OTHER): Payer: Medicare Other

## 2018-08-02 DIAGNOSIS — Z87891 Personal history of nicotine dependence: Secondary | ICD-10-CM | POA: Diagnosis not present

## 2018-08-02 DIAGNOSIS — R0609 Other forms of dyspnea: Secondary | ICD-10-CM | POA: Insufficient documentation

## 2018-08-02 DIAGNOSIS — I081 Rheumatic disorders of both mitral and tricuspid valves: Secondary | ICD-10-CM | POA: Insufficient documentation

## 2018-08-02 DIAGNOSIS — E785 Hyperlipidemia, unspecified: Secondary | ICD-10-CM | POA: Diagnosis not present

## 2018-08-02 DIAGNOSIS — I4891 Unspecified atrial fibrillation: Secondary | ICD-10-CM

## 2018-08-02 DIAGNOSIS — I119 Hypertensive heart disease without heart failure: Secondary | ICD-10-CM | POA: Diagnosis not present

## 2018-08-02 DIAGNOSIS — G4733 Obstructive sleep apnea (adult) (pediatric): Secondary | ICD-10-CM | POA: Insufficient documentation

## 2018-08-02 HISTORY — PX: TRANSTHORACIC ECHOCARDIOGRAM: SHX275

## 2018-08-07 ENCOUNTER — Telehealth: Payer: Self-pay | Admitting: *Deleted

## 2018-08-07 NOTE — Telephone Encounter (Signed)
LEFT MESSAGE TO CALL BACK  RELEASE TO [place] - W/COMMENT FORWARD TO PRIMARY

## 2018-08-07 NOTE — Telephone Encounter (Signed)
Spoke to patient and daughter. Result given. Patient voiced understanding.  Patient states she and daughter has also reviewed  Echo report. Patient states she and daughter has some concerns about patient treatment for the atrial fib- would like RN to review with doctor - who read echo- since Dr [name] is not available.  RN  Reviewed and discussed with Dr Percival Spanish--- Per Dr Percival Spanish:. -- 48 hour monitor result are not back - verify afib burden. Patient was informed that no further procedure ( ie ... Cardioversion) could be done until monitor report is reviewed and and patient being on an anticoagulant being used for a few weeks.  -- patient was offered to start an anticoagulant at the initial appointment with Dr [name] , but wanted to research about the medication  ( per patient) Per Dr Percival Spanish , recommend to start medication ( eliquis) . Patient still was not ready to start , but wanted some more education concerning eliquis , xarelto, or warfarin.  RN  Offered patient an appointment with [facility] pharmacist for education on medications for 08/09/18. decisions can be made and prescription issued if patient is in agreement. Patient was in agreement with appointment. Also follow up appointment with Dr [name] moved to 08/28/18 to discuss monitor and echo report. Patient and daughter both verbalized understanding - conversation @ 69 - 1 hour -

## 2018-08-07 NOTE — Telephone Encounter (Signed)
-----   Message from Minus Breeding, MD sent at 08/03/2018  8:37 AM EDT ----- NL LV function.  There is left atrial enlargement and severe TR.  No change in therapy at this point.  She needs to discuss these findings with Dr. [name].  Call Ms. Cove with the results and send results to Marin Olp, MD

## 2018-08-07 NOTE — Telephone Encounter (Signed)
Agree 

## 2018-08-07 NOTE — Telephone Encounter (Signed)
Follow up  ° ° °Patient is returning call.  °

## 2018-08-09 ENCOUNTER — Ambulatory Visit (INDEPENDENT_AMBULATORY_CARE_PROVIDER_SITE_OTHER): Payer: Medicare Other | Admitting: Pharmacist

## 2018-08-09 ENCOUNTER — Encounter: Payer: Self-pay | Admitting: Pharmacist

## 2018-08-09 DIAGNOSIS — I4891 Unspecified atrial fibrillation: Secondary | ICD-10-CM | POA: Diagnosis not present

## 2018-08-09 NOTE — Progress Notes (Signed)
Patient ID: Adriana Hendricks                 DOB: 1935-01-22                      MRN: [id number]     HPI: Adriana Hendricks is a 83 y.o. female referred by Dr. [name] to pharmacist clinic for medication management and counseling. PMH include hypertension, ocular migraine, new onset atrial fibrillation, IBS, and hyperlipidemia. She is currently undergoing additional assessment for valve regurgitation and had a recommendation to initiate anti-coagulation therapy.  She presents to clinic accompany be her daughter (OR nurse) to discuss available alternatives for anticoagulation. Patient and daughter are interested in dosing, monitoring, ADR, drug and food interactions, reversal/antidote, etc. We will discuss DOACs and warfarin for management of Afib plus potential valve involvement.  Wt Readings from Last 3 Encounters:  08/01/18 168 lb (76.2 kg)  07/26/18 168 lb 12.8 oz (76.6 kg)  07/18/18 170 lb 12.8 oz (77.5 kg)   BP Readings from Last 3 Encounters:  07/26/18 138/79  07/18/18 120/74  03/02/18 110/70   Pulse Readings from Last 3 Encounters:  07/26/18 83  07/18/18 74  03/02/18 88    Past Medical History:  Diagnosis Date  . Achilles tendon rupture   . Arthritis of hand, right   . Carpal tunnel syndrome 09/04/2013  . Collagenous colitis 2005  . Colon polyps 2010   Tubular adenoma and hyperplastic  . Dental infection 10/25/2014  . Diverticulosis   . Esophageal stenosis   . Essential tremor   . GERD (gastroesophageal reflux disease)    hx hiatal hernia, hx esophagitis, hx stricture  . Heart murmur   . Hiatal hernia   . Hx: UTI (urinary tract infection)   . Hypertension   . Lower extremity neuropathy 08/23/2013   On B12 therapy with numbness in the feet bilaterally no evidence of diabetes   . Ocular migraine    jagged vision, a few per month  . OSA (obstructive sleep apnea) 12/21/2016   on CPAP  . PAF (paroxysmal atrial fibrillation) ([place]) 06/2018   NEWLY DIAGNOSED 07/18/2018  .  Peripheral neuropathy    treated by Dr Posey Pronto (07/2015)    Current Outpatient Medications on File Prior to Visit  Medication Sig Dispense Refill  . aspirin EC 81 MG tablet Take 81 mg by mouth daily.    . calcium carbonate (TUMS - DOSED IN MG ELEMENTAL CALCIUM) 500 MG chewable tablet Chew 2 tablets by mouth daily.    . Cholecalciferol (VITAMIN D3) 1000 units CAPS Take 1 capsule by mouth daily.    . Multiple Vitamins-Minerals (ICAPS AREDS 2) CAPS Take 1 capsule by mouth daily.    . nadolol-bendroflumethiazide (CORZIDE) 40-5 MG tablet TAKE 1 TABLET DAILY 90 tablet 1  . Omega-3 1000 MG CAPS Take by mouth.    . ranitidine (ZANTAC) 300 MG capsule Take 1 capsule (300 mg total) by mouth 2 (two) times daily. (Patient taking differently: Take 300 mg by mouth every evening. ) 180 capsule 3  . Turmeric 500 MG CAPS Take 1 capsule by mouth daily.     No current facility-administered medications on file prior to visit.     Allergies  Allergen Reactions  . Clarithromycin     REACTION: nausea  . Erythromycin Base Nausea And Vomiting  . Levofloxacin     Patient has a history of a Achilles tendon tear and developed tendon pain while on medication  .  Metronidazole     REACTION: Rash  . Penicillins     Rash     New onset atrial fibrillation (HCC) New onset Afib recently diagnosed and recommendation given to initiate anticoagulation CHADS-VAsc 4 (age x 2, female, hypertension). Patient will like to make an informed decision due to recent finding of valve regurgitation in addition to atrial fibrillation.   We discussed Xarelto, Pradaxa, Eliquis, and warfarin dosing, monitoring, reversal, drug-drug interaction, and drug-food interactions. Patient please with discussion and plan to make final decision during next office visit with DR [name] in 3 weeks. Likely initiation of warfarin on Sept/3.   Braulio Kiedrowski Rodriguez-Guzman PharmD, BCPS, [place]  [id number] 08/09/2018 5:08 PM

## 2018-08-09 NOTE — Assessment & Plan Note (Signed)
New onset Afib recently diagnosed and recommendation given to initiate anticoagulation CHADS-VAsc 4 (age x 2, female, hypertension). Patient will like to make an informed decision due to recent finding of valve regurgitation in addition to atrial fibrillation.   We discussed Xarelto, Pradaxa, Eliquis, and warfarin dosing, monitoring, reversal, drug-drug interaction, and drug-food interactions. Patient please with discussion and plan to make final decision during next office visit with DR [name] in 3 weeks. Likely initiation of warfarin on Sept/3.

## 2018-08-15 ENCOUNTER — Telehealth: Payer: Self-pay | Admitting: *Deleted

## 2018-08-15 NOTE — Telephone Encounter (Signed)
LEFT MESSAGE-- TO CALL MAY HAVE AN EARLY APPOINTMENT TO GIVE

## 2018-08-16 ENCOUNTER — Ambulatory Visit: Payer: Medicare Other | Admitting: Cardiology

## 2018-08-17 ENCOUNTER — Other Ambulatory Visit: Payer: Self-pay | Admitting: Family Medicine

## 2018-08-17 MED ORDER — NADOLOL-BENDROFLUMETHIAZIDE 40-5 MG PO TABS: 1.0000 | ORAL_TABLET | Freq: Every day | ORAL | 0 refills | Status: DC

## 2018-08-17 NOTE — Telephone Encounter (Signed)
Incoming call for refill     Corizide 40mg  refill Last Refill:12/18/17   #90 Last OV: 09/13/17 PCP: Garret Reddish Pharmacy:[facility]  Could not refill r/t/ Rx order expired

## 2018-08-17 NOTE — Telephone Encounter (Signed)
Copied from [place] [id number]. Topic: Quick Communication - Rx Refill/Question >> Aug 17, 2018  8:42 AM Yvette Rack wrote: Medication: nadolol-bendroflumethiazide (CORZIDE) 40-5 MG tablet  90 day supply  Has the patient contacted their pharmacy? Yes.  Last week (Agent: If no, request that the patient contact the pharmacy for the refill.) (Agent: If yes, when and what did the pharmacy advise?) contact provider   Preferred Pharmacy (with phone number or street name): [facility] [place] [facility] [phone] (Phone) [phone] (Fax)    Agent: Please be advised that RX refills may take up to 3 business days. We ask that you follow-up with your pharmacy.

## 2018-08-21 ENCOUNTER — Other Ambulatory Visit: Payer: Self-pay

## 2018-08-21 MED ORDER — NADOLOL-BENDROFLUMETHIAZIDE 40-5 MG PO TABS: 1.0000 | ORAL_TABLET | Freq: Every day | ORAL | 1 refills | Status: DC

## 2018-08-24 ENCOUNTER — Telehealth: Payer: Self-pay | Admitting: Family Medicine

## 2018-08-24 NOTE — Telephone Encounter (Signed)
See note.   Copied from [place] [id number]. Topic: General - Other >> Aug 24, 2018 11:38 AM Yvette Rack wrote: Reason for CRM: [facility] [place] [phone] reference # [id number] calling stating that this medicine has been discontinued and would like an alternative for the nadolol-bendroflumethiazide (CORZIDE) 40-5 MG tablet

## 2018-08-24 NOTE — Telephone Encounter (Signed)
Please Advise. I can send in the medication for you

## 2018-08-25 NOTE — Telephone Encounter (Signed)
Please send in nadolol 40mg  #90 for daily use. We will see how she does without diuretic portion. 3 refills. If Bp remains controlled will keep that regimen- may need to add a diuretic back in at September 24th visit

## 2018-08-28 ENCOUNTER — Ambulatory Visit (INDEPENDENT_AMBULATORY_CARE_PROVIDER_SITE_OTHER): Payer: Medicare Other | Admitting: Pharmacist Clinician (PhC)/ Clinical Pharmacy Specialist

## 2018-08-28 ENCOUNTER — Ambulatory Visit (INDEPENDENT_AMBULATORY_CARE_PROVIDER_SITE_OTHER): Payer: Medicare Other | Admitting: Cardiology

## 2018-08-28 ENCOUNTER — Encounter: Payer: Self-pay | Admitting: Cardiology

## 2018-08-28 VITALS — BP 132/82 | HR 72 | Ht 66.0 in | Wt 168.0 lb

## 2018-08-28 DIAGNOSIS — I481 Persistent atrial fibrillation: Secondary | ICD-10-CM

## 2018-08-28 DIAGNOSIS — R0609 Other forms of dyspnea: Secondary | ICD-10-CM | POA: Diagnosis not present

## 2018-08-28 DIAGNOSIS — Z7901 Long term (current) use of anticoagulants: Secondary | ICD-10-CM | POA: Insufficient documentation

## 2018-08-28 DIAGNOSIS — I071 Rheumatic tricuspid insufficiency: Secondary | ICD-10-CM

## 2018-08-28 DIAGNOSIS — I4891 Unspecified atrial fibrillation: Secondary | ICD-10-CM

## 2018-08-28 DIAGNOSIS — I10 Essential (primary) hypertension: Secondary | ICD-10-CM | POA: Diagnosis not present

## 2018-08-28 DIAGNOSIS — I4819 Other persistent atrial fibrillation: Secondary | ICD-10-CM

## 2018-08-28 MED ORDER — WARFARIN SODIUM 5 MG PO TABS: 5.0000 mg | ORAL_TABLET | Freq: Once | ORAL | 4 refills | Status: DC

## 2018-08-28 MED ORDER — FUROSEMIDE 20 MG PO TABS: 20.0000 mg | ORAL_TABLET | Freq: Every day | ORAL | 6 refills | Status: DC

## 2018-08-28 NOTE — Progress Notes (Signed)
PCP: Marin Olp, MD  Clinic Note: Chief Complaint  Patient presents with  . Hospitalization Follow-up  . Atrial Fibrillation    HPI: Adriana Hendricks is a 83 y.o. female with a PMH below who presents today for f/u for persistent Afib (recent Dx).  Adriana Hendricks was initially seen on 8/1 with new onset Afib at the request of Marin Olp, MD.  Referred to [facility] to Discuss DOAC/warfarin - no decision.  Recent Hospitalizations: n/a    Studies Personally Reviewed - (if available, images/films reviewed: From Epic Chart or Care Everywhere)  Holter- Persistent Afib (rate 33 - 124 bpm)  Echo - Normal LV Fxn (EF 60-65%) no RWMA.  Severe LA dilation & Severe TR.  Nuc - ; EF 71 %. LOW RISK - no ischemia or Infarct.   Interval History: [name] presents here today for follow-up pretty much sure that she wants to do warfarin.  We spent quite a bit time talking about the pros and cons of DOAC versus warfarin, but she cites all of the research that she is done and wanting to stay with the "dried and true ". She is noticing having exertional dyspnea and it is been pretty much persistent for the last few weeks.  She is also noticing a little bit of edema along with some orthopnea and PND type symptoms.  Usually the swelling goes down in the evening. As far as sensing her abnormal heart rate she really only noticed it when her heart rate goes fast and she feels very flushed and short of breath as well as dizzy.  These episodes of tachycardia do not occur that frequently, but they tend to be associated with her feeling anxious or nervous or stressed.  Often if she tries a rush to do something she will notice it as well.  She had lots of questions about her echocardiogram, being concerned about tricuspid regurgitation and insisting that somewhere she saw rheumatic valve disease which was not an anywhere in the report.  We spent quite a bit time talking about the echo as well as monitor and stress  test results.  No chest pain or shortness of breath with rest or exertion. No syncope/near syncope. No TIA/amaurosis fugax symptoms. No melena, hematochezia, hematuria, or epstaxis. No claudication.  ROS: A comprehensive was performed. Review of Systems  Constitutional: Positive for malaise/fatigue (She does notice less energy than usual).  HENT: Negative for congestion.   Respiratory: Negative for cough, shortness of breath (Only with exertion) and wheezing.   Gastrointestinal: Negative for heartburn.  Musculoskeletal: Negative for falls.  Neurological: Negative for dizziness.  Endo/Heme/Allergies: Does not bruise/bleed easily.  Psychiatric/Behavioral: The patient is nervous/anxious (About all of the studies).   All other systems reviewed and are negative.  I have reviewed and (if needed) personally updated the patient's problem list, medications, allergies, past medical and surgical history, social and family history.   Past Medical History:  Diagnosis Date  . Achilles tendon rupture   . Arthritis of hand, right   . Carpal tunnel syndrome 09/04/2013  . Collagenous colitis 2005  . Colon polyps 2010   Tubular adenoma and hyperplastic  . Dental infection 10/25/2014  . Diverticulosis   . Esophageal stenosis   . Essential tremor   . GERD (gastroesophageal reflux disease)    hx hiatal hernia, hx esophagitis, hx stricture  . Heart murmur   . Hiatal hernia   . Hx: UTI (urinary tract infection)   . Hypertension   .  Lower extremity neuropathy 08/23/2013   On B12 therapy with numbness in the feet bilaterally no evidence of diabetes   . Ocular migraine    jagged vision, a few per month  . OSA (obstructive sleep apnea) 12/21/2016   on CPAP  . PAF (paroxysmal atrial fibrillation) ([place]) 06/2018   NEWLY DIAGNOSED 07/18/2018  . Peripheral neuropathy    treated by Dr Posey Pronto (07/2015)    Past Surgical History:  Procedure Laterality Date  . 48 hr Holter Monitor  07/2018   Persistent  Afib (rate 33 - 124 bpm)  . APPENDECTOMY    . CARPAL TUNNEL RELEASE    . CATARACT EXTRACTION    . EXCISION MORTON'S NEUROMA     Right foot  . INTRAOCULAR LENS IMPLANT, SECONDARY    . Moles removed    . MOUTH SURGERY     (For Exostosis)  . NUCLEAR  07/2018   EF 71 %. LOW RISK - no ischemia or Infarct.   Marland Kitchen ROTATOR CUFF REPAIR    . TONSILLECTOMY    . TRANSTHORACIC ECHOCARDIOGRAM  07/2018   Normal LV Fxn (EF 60-65%) no RWMA.  Severe LA dilation & Severe TR.    Current Meds  Medication Sig  . calcium carbonate (TUMS - DOSED IN MG ELEMENTAL CALCIUM) 500 MG chewable tablet Chew 2 tablets by mouth daily.  . Cholecalciferol (VITAMIN D3) 1000 units CAPS Take 1 capsule by mouth daily.  . Multiple Vitamins-Minerals (ICAPS AREDS 2) CAPS Take 1 capsule by mouth daily.  . Omega-3 1000 MG CAPS Take by mouth.  . ranitidine (ZANTAC) 300 MG capsule Take 1 capsule (300 mg total) by mouth 2 (two) times daily. (Patient taking differently: Take 300 mg by mouth every evening. )  . [DISCONTINUED] aspirin EC 81 MG tablet Take 81 mg by mouth daily.  . [DISCONTINUED] nadolol-bendroflumethiazide (CORZIDE) 40-5 MG tablet Take 1 tablet by mouth daily.  . [DISCONTINUED] Turmeric 500 MG CAPS Take 1 capsule by mouth daily.    Allergies  Allergen Reactions  . Clarithromycin     REACTION: nausea  . Erythromycin Base Nausea And Vomiting  . Levofloxacin     Patient has a history of a Achilles tendon tear and developed tendon pain while on medication  . Metronidazole     REACTION: Rash  . Penicillins     Rash     Social History   Tobacco Use  . Smoking status: Former Smoker    Packs/day: 2.00    Years: 13.00    Pack years: 26.00    Types: Cigarettes    Start date: 12/26/1954    Last attempt to quit: 03/26/1968    Years since quitting: 50.4  . Smokeless tobacco: Never Used  . Tobacco comment: Quit in 1969 - smokeed 2-3PPD , was 3 PPD at her heaviest for about 3 years.   Substance Use Topics  . Alcohol  use: Yes    Alcohol/week: 1.0 standard drinks    Types: 1 Glasses of wine per week    Comment: 1 glass of wine per day  . Drug use: No   Social History   Social History Narrative   Married. Husband is patient of Dr. Yong Channel.    Married 1958. 2 children. 4 grandkids (3 girls, 1 boy).    Retired from [employer].   Hobbies: genealogy and DNA    family history includes Barrett's esophagus in her son; Heart failure in her father; Other in her mother; Rectal  cancer in her father.  Wt Readings from Last 3 Encounters:  08/29/18 167 lb (75.8 kg)  08/28/18 168 lb (76.2 kg)  08/01/18 168 lb (76.2 kg)    PHYSICAL EXAM BP 132/82   Pulse 72   Ht 5\' 6"  (1.676 m)   Wt 168 lb (76.2 kg)   SpO2 95%   BMI 27.12 kg/m  Physical Exam  Constitutional: She appears well-developed and well-nourished. No distress.  Well-groomed.  Healthy-appearing.  HENT:  Head: Normocephalic and atraumatic.  Neck: No hepatojugular reflux and no JVD present. Carotid bruit is not present.  Cardiovascular: Normal rate, normal heart sounds, intact distal pulses and normal pulses. An irregularly irregular rhythm present. PMI is not displaced. Exam reveals no gallop and no friction rub.  No murmur heard. Vitals reviewed.    Adult ECG Report n/a  Other studies Reviewed: Additional studies/ records that were reviewed today include:  Recent Labs:  n/a  ASSESSMENT / PLAN: Problem List Items Addressed This Visit    DOE (dyspnea on exertion) (Chronic)    Relatively normal echocardiogram and nonischemic Myoview.  I do think that she may be symptomatic from her A. fib.  I would like to see how she does out of A. fib so we will therefore give at least one attempt at cardioversion. We will add furosemide for mild diuretic effect. May need to consider afterload reduction      Relevant Orders   ELECTRICAL CARDIOVERSION   Essential hypertension - Primary (Chronic)    She is being  converted from nadolol-bendroflumethazide to nadolol alone.  Current blood pressures pretty well controlled and no need to titrate further.    Since she is having some edema, will go ahead and add a standing dose of Lasix for her to take daily and additional doses on an as-needed basis for dyspnea.      Relevant Medications   warfarin (COUMADIN) 5 MG tablet   furosemide (LASIX) 20 MG tablet   Long term (current) use of anticoagulants    She is chosen to start warfarin.  This will be started by our pharmacy team and followed up in our Coumadin clinic as part of the [place] ([facility])      Persistent atrial fibrillation ([place]): CHA2DS2Vasc = 4 (Chronic)    Long discussion of pros and cons for DOAC versus warfarin.  She is chosen to go forward with warfarin.  This will be initiated today. We reviewed the results of her stress test and echocardiogram.  The fact that she has a significantly dilated left atrium and would suggest that we may have difficulty maintaining sinus rhythm and that this is a more long-standing diagnosis than previously known.  For now will continue with current rate control based on the fact that she has had some low heart rates as well as fast heart rates.   We will give at least one attempt to try to cardiovert into sinus rhythm to then see how well she maintains sinus rhythm.  We will need to schedule for cardioversion after 1 month therapeutic on warfarin.  Plan: Initiate warfarin, schedule cardioversion 4 weeks following therapeutic INR  She will then follow-up with the A. fib clinic to determine if she may benefit from antiarrhythmic.      Relevant Medications   warfarin (COUMADIN) 5 MG tablet   furosemide (LASIX) 20 MG tablet   Other Relevant Orders   ELECTRICAL CARDIOVERSION   Severe tricuspid regurgitation by prior echocardiogram    Severe  TR is a suggestion of elevated pulmonary pressures, however she did not necessarily have IVC  plethora to suggest elevated right heart pressures.  I think a lot of this could be related to atrial fibrillation and the same phenomenon that leads to cannon A waves.  Certainly since there is probably some diastolic heart failure component, I will add low-dose Lasix.      Relevant Medications   warfarin (COUMADIN) 5 MG tablet   furosemide (LASIX) 20 MG tablet      I spent a total of 45 minutes with the patient and chart review. >  50% of the time was spent in direct patient consultation.   Current medicines are reviewed at length with the patient today.  (+/- concerns) Decided on Warfarin The following changes have been made:  will start warfarin ([facility] assisting)  Patient Instructions  Medication instructions   warfarin 5 mg one tablet at 6 pm daily unless otherwise instructed by  [facility]- PHARMACIST   Start furosemide 20 mg daily starting tomorrow take 2 tablets ( 40 mg total) for  2 days then return to 20 mg daily.   Will schedule cardioversion in about 4 weeks after starting warfarin. Your physician has recommended that you have a Cardioversion (DCCV). Electrical Cardioversion uses a jolt of electricity to your heart either through paddles or wired patches attached to your chest. This is a controlled, usually prescheduled, procedure. Defibrillation is done under light anesthesia in the hospital, and you usually go home the day of the procedure. This is done to get your heart back into a normal rhythm. You are not awake for the procedure. Please see the instruction sheet given to you today.    Your physician recommends that you schedule a follow-up appointment in  6- 7 weeks with Afib. clinic - post cardioversion   Your physician recommends that you schedule a follow-up appointment in 3 months with DR [name].   Studies Ordered:   Orders Placed This Encounter  Procedures  . ELECTRICAL CARDIOVERSION      Glenetta Hew, M.D., M.S. Interventional Cardiologist   Pager #  [phone] Phone # [phone] [address]. [place], [place] [address]   Thank you for choosing [facility] at [facility]!!

## 2018-08-28 NOTE — Telephone Encounter (Signed)
See note

## 2018-08-28 NOTE — Patient Instructions (Addendum)
Medication instructions   warfarin 5 mg one tablet at 6 pm daily unless otherwise instructed by  [facility]- PHARMACIST   Start furosemide 20 mg daily starting tomorrow take 2 tablets ( 40 mg total) for  2 days then return to 20 mg daily.   Will schedule cardioversion in about 4 weeks after starting warfarin. Your physician has recommended that you have a Cardioversion (DCCV). Electrical Cardioversion uses a jolt of electricity to your heart either through paddles or wired patches attached to your chest. This is a controlled, usually prescheduled, procedure. Defibrillation is done under light anesthesia in the hospital, and you usually go home the day of the procedure. This is done to get your heart back into a normal rhythm. You are not awake for the procedure. Please see the instruction sheet given to you today.    Your physician recommends that you schedule a follow-up appointment in  6- 7 weeks with Afib. clinic - post cardioversion   Your physician recommends that you schedule a follow-up appointment in 3 months with DR [name].      Electrical Cardioversion Electrical cardioversion is the delivery of a jolt of electricity to restore a normal rhythm to the heart. A rhythm that is too fast or is not regular keeps the heart from pumping well. In this procedure, sticky patches or metal paddles are placed on the chest to deliver electricity to the heart from a device. This procedure may be done in an emergency if:  There is low or no blood pressure as a result of the heart rhythm.  Normal rhythm must be restored as fast as possible to protect the brain and heart from further damage.  It may save a life.  This procedure may also be done for irregular or fast heart rhythms that are not immediately life-threatening. Tell a health care provider about:  Any allergies you have.  All medicines you are taking, including vitamins, herbs, eye drops, creams, and over-the-counter  medicines.  Any problems you or family members have had with anesthetic medicines.  Any blood disorders you have.  Any surgeries you have had.  Any medical conditions you have.  Whether you are pregnant or may be pregnant. What are the risks? Generally, this is a safe procedure. However, problems may occur, including:  Allergic reactions to medicines.  A blood clot that breaks free and travels to other parts of your body.  The possible return of an abnormal heart rhythm within hours or days after the procedure.  Your heart stopping (cardiac arrest). This is rare.  What happens before the procedure? Medicines  Your health care provider may have you start taking: ? Blood-thinning medicines (anticoagulants) so your blood does not clot as easily. ? Medicines may be given to help stabilize your heart rate and rhythm.  Ask your health care provider about changing or stopping your regular medicines. This is especially important if you are taking diabetes medicines or blood thinners. General instructions  Plan to have someone take you home from the hospital or clinic.  If you will be going home right after the procedure, plan to have someone with you for 24 hours.  Follow instructions from your health care provider about eating or drinking restrictions. What happens during the procedure?  To lower your risk of infection: ? Your health care team will wash or sanitize their hands. ? Your skin will be washed with soap.  An IV tube will be inserted into one of your veins.  You will  be given a medicine to help you relax (sedative).  Sticky patches (electrodes) or metal paddles may be placed on your chest.  An electrical shock will be delivered. The procedure may vary among health care providers and hospitals. What happens after the procedure?  Your blood pressure, heart rate, breathing rate, and blood oxygen level will be monitored until the medicines you were given have worn  off.  Do not drive for 24 hours if you were given a sedative.  Your heart rhythm will be watched to make sure it does not change. This information is not intended to replace advice given to you by your health care provider. Make sure you discuss any questions you have with your health care provider. Document Released: 12/02/2002 Document Revised: 08/10/2016 Document Reviewed: 06/17/2016 Elsevier Interactive Patient Education  2017 [employer].

## 2018-08-28 NOTE — Telephone Encounter (Signed)
Spoke to pt, told her Dr. Yong Channel is changing her Corzide to Nadolol due to not available. Pt said she made an appointment tomorrow to sit down and discuss medications due to will be starting on 2 new heart drugs today and want s to discuss to discuss with Dr. Yong Channel before starting new medication. Told pt that is fine I will not send in Rx . Pt verbalized understanding.

## 2018-08-28 NOTE — Telephone Encounter (Signed)
[facility] mail service is calling  In regards to a update on the medication. Reference number: [id number]

## 2018-08-29 ENCOUNTER — Encounter: Payer: Self-pay | Admitting: Family Medicine

## 2018-08-29 ENCOUNTER — Encounter: Payer: Self-pay | Admitting: *Deleted

## 2018-08-29 ENCOUNTER — Ambulatory Visit (INDEPENDENT_AMBULATORY_CARE_PROVIDER_SITE_OTHER): Payer: Medicare Other | Admitting: Family Medicine

## 2018-08-29 ENCOUNTER — Encounter: Payer: Self-pay | Admitting: Cardiology

## 2018-08-29 ENCOUNTER — Ambulatory Visit: Payer: Medicare Other | Admitting: Cardiology

## 2018-08-29 VITALS — BP 122/72 | HR 72 | Temp 98.6°F | Ht 66.0 in | Wt 167.0 lb

## 2018-08-29 DIAGNOSIS — I071 Rheumatic tricuspid insufficiency: Secondary | ICD-10-CM

## 2018-08-29 DIAGNOSIS — G250 Essential tremor: Secondary | ICD-10-CM | POA: Diagnosis not present

## 2018-08-29 DIAGNOSIS — K219 Gastro-esophageal reflux disease without esophagitis: Secondary | ICD-10-CM

## 2018-08-29 DIAGNOSIS — I10 Essential (primary) hypertension: Secondary | ICD-10-CM | POA: Diagnosis not present

## 2018-08-29 MED ORDER — NADOLOL 40 MG PO TABS: 40.0000 mg | ORAL_TABLET | Freq: Every day | ORAL | 3 refills | Status: DC

## 2018-08-29 NOTE — Assessment & Plan Note (Signed)
Severe TR is a suggestion of elevated pulmonary pressures, however she did not necessarily have IVC plethora to suggest elevated right heart pressures.  I think a lot of this could be related to atrial fibrillation and the same phenomenon that leads to cannon A waves.  Certainly since there is probably some diastolic heart failure component, I will add low-dose Lasix.

## 2018-08-29 NOTE — Assessment & Plan Note (Signed)
She is being converted from nadolol-bendroflumethazide to nadolol alone.  Current blood pressures pretty well controlled and no need to titrate further.    Since she is having some edema, will go ahead and add a standing dose of Lasix for her to take daily and additional doses on an as-needed basis for dyspnea.

## 2018-08-29 NOTE — Assessment & Plan Note (Signed)
S: controlled on corzide (nadolol and thiazide)  . Now on lasix through cardiology BP Readings from Last 3 Encounters:  08/29/18 122/72  08/28/18 132/82  07/26/18 138/79  A/P: stop the thiazide portion of corzide and continue nadolol 40mg  alone. Suspect BP will remain well controlled- has regular cards follow up for recheck

## 2018-08-29 NOTE — Patient Instructions (Addendum)
Health Maintenance Due  Topic Date Due  . INFLUENZA VACCINE -schedule for this Fall 07/26/2018   Only change today is stopping the corzide and starting nadolol 40mg  alone. This still should control blood pressure and plus you are on new diuretic so probably dont need the low dose that is in corzide.   See you on 24th- can add any labs needed after what cardiology does

## 2018-08-29 NOTE — Progress Notes (Signed)
Patient was seen following Cardiology visit for Persistent AFib.  After long discussion - she decided to choose Warfarin for oral anticoagulant.  Started on 5mg  -- f/u scheduled.  Glenetta Hew, MD

## 2018-08-29 NOTE — Assessment & Plan Note (Signed)
Relatively normal echocardiogram and nonischemic Myoview.  I do think that she may be symptomatic from her A. fib.  I would like to see how she does out of A. fib so we will therefore give at least one attempt at cardioversion. We will add furosemide for mild diuretic effect. May need to consider afterload reduction

## 2018-08-29 NOTE — Assessment & Plan Note (Signed)
She is chosen to start warfarin.  This will be started by our pharmacy team and followed up in our Coumadin clinic as part of the [place] ([facility])

## 2018-08-29 NOTE — Assessment & Plan Note (Addendum)
Long discussion of pros and cons for DOAC versus warfarin.  She is chosen to go forward with warfarin.  This will be initiated today. We reviewed the results of her stress test and echocardiogram.  The fact that she has a significantly dilated left atrium and would suggest that we may have difficulty maintaining sinus rhythm and that this is a more long-standing diagnosis than previously known.  For now will continue with current rate control based on the fact that she has had some low heart rates as well as fast heart rates.   We will give at least one attempt to try to cardiovert into sinus rhythm to then see how well she maintains sinus rhythm.  We will need to schedule for cardioversion after 1 month therapeutic on warfarin.  Plan: Initiate warfarin, schedule cardioversion 4 weeks following therapeutic INR  She will then follow-up with the A. fib clinic to determine if she may benefit from antiarrhythmic.

## 2018-08-29 NOTE — Telephone Encounter (Signed)
Unable to make appt . She kept appointment for 08/28/18

## 2018-08-29 NOTE — Assessment & Plan Note (Signed)
S: nadolol has worked well for tremor and for HR control A/P: would be willing to fill out prior auth if needed for nadolol given concurrent uses in patient- BP, tremor, a fib

## 2018-08-29 NOTE — Assessment & Plan Note (Signed)
S: has done well off prilosec parimarily on zantac 300mg  once a day A/P: continue current medication

## 2018-08-29 NOTE — Progress Notes (Signed)
Subjective:  Adriana Hendricks is a 83 y.o. year old very pleasant female patient who presents for/with See problem oriented charting ROS- palpitations. Feels flushed when HR gets up. No chest pain. Some fatigue.    Past Medical History-  Patient Active Problem List   Diagnosis Date Noted  . Severe tricuspid regurgitation by prior echocardiogram 08/29/2018    Priority: High  . Persistent atrial fibrillation ([place]) 07/26/2018    Priority: High  . Osteopenia 03/13/2017    Priority: High  . Ocular migraine 03/13/2017    Priority: Medium  . OSA (obstructive sleep apnea) 12/21/2016    Priority: Medium  . Lower extremity neuropathy 08/23/2013    Priority: Medium  . Hyperlipemia 01/07/2010    Priority: Medium  . GASTROESOPHAGEAL REFLUX DISEASE 06/19/2009    Priority: Medium  . Essential tremor 11/16/2007    Priority: Medium  . Essential hypertension 11/16/2007    Priority: Medium  . Long term (current) use of anticoagulants 08/28/2018    Priority: Low  . IBS (irritable bowel syndrome) 09/12/2016    Priority: Low  . History of adenomatous polyp of colon 09/12/2016    Priority: Low  . Former smoker 09/12/2016    Priority: Low  . Carpal tunnel syndrome 09/04/2013    Priority: Low  . Achilles tendon tear 01/17/2011    Priority: Low  . ACNE ROSACEA 04/03/2008    Priority: Low  . DOE (dyspnea on exertion) 07/26/2018    Medications- reviewed and updated Current Outpatient Medications  Medication Sig Dispense Refill  . calcium carbonate (TUMS - DOSED IN MG ELEMENTAL CALCIUM) 500 MG chewable tablet Chew 2 tablets by mouth daily.    . Cholecalciferol (VITAMIN D3) 1000 units CAPS Take 1 capsule by mouth daily.    . furosemide (LASIX) 20 MG tablet Take 1 tablet (20 mg total) by mouth daily. 30 tablet 6  . Multiple Vitamins-Minerals (ICAPS AREDS 2) CAPS Take 1 capsule by mouth daily.    . Omega-3 1000 MG CAPS Take by mouth.    . ranitidine (ZANTAC) 300 MG capsule Take 1 capsule (300 mg  total) by mouth 2 (two) times daily. (Patient taking differently: Take 300 mg by mouth every evening. ) 180 capsule 3  . warfarin (COUMADIN) 5 MG tablet Take 1 tablet (5 mg total) by mouth one time only at 6 PM. Or as directed.. 30 tablet 4  . nadolol (CORGARD) 40 MG tablet Take 1 tablet (40 mg total) by mouth daily. 903 tablet 3   No current facility-administered medications for this visit.     Objective: BP 122/72 (BP Location: Left Arm, Patient Position: Sitting, Cuff Size: Large)   Pulse 72   Temp 98.6 F (37 C) (Oral)   Ht 5\' 6"  (1.676 m)   Wt 167 lb (75.8 kg)   SpO2 96%   BMI 26.95 kg/m  Gen: NAD, resting comfortably UR:[id number] irregular. No rubs. Systolic murmur noted Lungs: CTAB no crackles, wheeze, rhonchi Abdomen: soft/nontender/nondistended Ext: no edema Skin: warm, dry  Assessment/Plan:  Other notes: 1. History underactive thyroid- wants tsh, t3, t4 - reasonable particularly with a fib. She has labs coming up with cardiology and wants to hold off on labs here unless its something that wasn't done there.  2. Family history hemochromatosis- ferritin panel at [facility] requested 3. We discussed recent events and follow up with cardiology  Essential hypertension S: controlled on corzide (nadolol and thiazide)  . Now on lasix through cardiology BP Readings from Last 3 Encounters:  08/29/18 122/72  08/28/18 132/82  07/26/18 138/79  A/P: stop the thiazide portion of corzide and continue nadolol 40mg  alone. Suspect BP will remain well controlled- has regular cards follow up for recheck     GASTROESOPHAGEAL REFLUX DISEASE S: has done well off prilosec parimarily on zantac 300mg  once a day A/P: continue current medication    Essential tremor S: nadolol has worked well for tremor and for HR control A/P: would be willing to fill out prior auth if needed for nadolol given concurrent uses in patient- BP, tremor, a fib   Future Appointments  Date Time Provider  [place]  09/05/2018  2:00 PM [facility]  09/12/2018  3:00 PM [facility]  09/18/2018  1:30 PM Marin Olp, MD [facility]  09/26/2018  3:30 PM Erlene Quan, PA-C [facility]  10/18/2018  2:30 PM Sherran Needs, NP [facility] None  11/27/2018  2:20 PM Leonie Man, MD [facility]  upcoming CPE   Meds ordered this encounter  Medications  . nadolol (CORGARD) 40 MG tablet    Sig: Take 1 tablet (40 mg total) by mouth daily.    Dispense:  903 tablet    Refill:  3    Return precautions advised.  Garret Reddish, MD

## 2018-09-03 ENCOUNTER — Ambulatory Visit: Payer: Medicare Other | Admitting: Cardiology

## 2018-09-05 ENCOUNTER — Ambulatory Visit (INDEPENDENT_AMBULATORY_CARE_PROVIDER_SITE_OTHER): Payer: Medicare Other | Admitting: Pharmacist

## 2018-09-05 DIAGNOSIS — Z7901 Long term (current) use of anticoagulants: Secondary | ICD-10-CM

## 2018-09-05 DIAGNOSIS — I481 Persistent atrial fibrillation: Secondary | ICD-10-CM

## 2018-09-05 DIAGNOSIS — I4819 Other persistent atrial fibrillation: Secondary | ICD-10-CM

## 2018-09-05 LAB — POCT INR: INR: 3.9 — AB (ref 2.0–3.0)

## 2018-09-12 ENCOUNTER — Ambulatory Visit (INDEPENDENT_AMBULATORY_CARE_PROVIDER_SITE_OTHER): Payer: Medicare Other | Admitting: Pharmacist Clinician (PhC)/ Clinical Pharmacy Specialist

## 2018-09-12 DIAGNOSIS — I4819 Other persistent atrial fibrillation: Secondary | ICD-10-CM

## 2018-09-12 DIAGNOSIS — Z7901 Long term (current) use of anticoagulants: Secondary | ICD-10-CM

## 2018-09-12 DIAGNOSIS — I481 Persistent atrial fibrillation: Secondary | ICD-10-CM

## 2018-09-12 LAB — POCT INR: INR: 3.4 — AB (ref 2.0–3.0)

## 2018-09-12 NOTE — Patient Instructions (Signed)
Description   Decrease to 1/2 tablet daily except for 1 tablet each Sunday, Tuesday and Thursday.  repeat INR in 1 week

## 2018-09-13 ENCOUNTER — Encounter: Payer: Medicare Other | Admitting: Family Medicine

## 2018-09-18 ENCOUNTER — Encounter: Payer: Self-pay | Admitting: Family Medicine

## 2018-09-18 ENCOUNTER — Telehealth: Payer: Self-pay

## 2018-09-18 ENCOUNTER — Ambulatory Visit (INDEPENDENT_AMBULATORY_CARE_PROVIDER_SITE_OTHER): Payer: Medicare Other | Admitting: Family Medicine

## 2018-09-18 VITALS — BP 136/70 | HR 71 | Temp 98.5°F | Ht 66.0 in | Wt 166.2 lb

## 2018-09-18 DIAGNOSIS — E876 Hypokalemia: Secondary | ICD-10-CM

## 2018-09-18 DIAGNOSIS — K219 Gastro-esophageal reflux disease without esophagitis: Secondary | ICD-10-CM | POA: Diagnosis not present

## 2018-09-18 DIAGNOSIS — Z8639 Personal history of other endocrine, nutritional and metabolic disease: Secondary | ICD-10-CM

## 2018-09-18 DIAGNOSIS — M85852 Other specified disorders of bone density and structure, left thigh: Secondary | ICD-10-CM

## 2018-09-18 DIAGNOSIS — I4819 Other persistent atrial fibrillation: Secondary | ICD-10-CM

## 2018-09-18 DIAGNOSIS — I10 Essential (primary) hypertension: Secondary | ICD-10-CM

## 2018-09-18 DIAGNOSIS — I481 Persistent atrial fibrillation: Secondary | ICD-10-CM

## 2018-09-18 DIAGNOSIS — E785 Hyperlipidemia, unspecified: Secondary | ICD-10-CM

## 2018-09-18 LAB — COMPREHENSIVE METABOLIC PANEL
ALK PHOS: 67 U/L (ref 39–117)
ALT: 20 U/L (ref 0–35)
AST: 19 U/L (ref 0–37)
Albumin: 4.4 g/dL (ref 3.5–5.2)
BUN: 24 mg/dL — ABNORMAL HIGH (ref 6–23)
CHLORIDE: 91 meq/L — AB (ref 96–112)
CO2: 35 meq/L — AB (ref 19–32)
Calcium: 9.8 mg/dL (ref 8.4–10.5)
Creatinine, Ser: 0.84 mg/dL (ref 0.40–1.20)
GFR: 69.13 mL/min (ref >60.00)
GLUCOSE: 111 mg/dL — AB (ref 70–99)
POTASSIUM: 2.8 meq/L — AB (ref 3.5–5.1)
Sodium: 133 mEq/L — ABNORMAL LOW (ref 135–145)
Total Bilirubin: 1.3 mg/dL — ABNORMAL HIGH (ref 0.2–1.2)
Total Protein: 7.5 g/dL (ref 6.0–8.3)

## 2018-09-18 LAB — T4, FREE: Free T4: 1 ng/dL (ref 0.60–1.60)

## 2018-09-18 LAB — CBC
HEMATOCRIT: 41.5 % (ref 36.0–46.0)
HEMOGLOBIN: 14.6 g/dL (ref 12.0–15.0)
MCHC: 35.2 g/dL (ref 30.0–36.0)
MCV: 90.7 fl (ref 78.0–100.0)
Platelets: 187 10*3/uL (ref 150.0–400.0)
RBC: 4.57 Mil/uL (ref 3.87–5.11)
RDW: 12.6 % (ref 11.5–15.5)
WBC: 7.3 10*3/uL (ref 4.0–10.5)

## 2018-09-18 LAB — LIPID PANEL
Cholesterol: 191 mg/dL (ref 0–200)
HDL: 53.7 mg/dL (ref >39.00)
LDL Cholesterol: 116 mg/dL — ABNORMAL HIGH (ref 0–99)
NONHDL: 136.94
Total CHOL/HDL Ratio: 4
Triglycerides: 106 mg/dL (ref 0.0–149.0)
VLDL: 21.2 mg/dL (ref 0.0–40.0)

## 2018-09-18 LAB — TSH: TSH: 2.4 u[IU]/mL (ref 0.35–4.50)

## 2018-09-18 LAB — T3, FREE: T3, Free: 2.7 pg/mL (ref 2.3–4.2)

## 2018-09-18 MED ORDER — POTASSIUM CHLORIDE CRYS ER 20 MEQ PO TBCR: 20.0000 meq | EXTENDED_RELEASE_TABLET | Freq: Every day | ORAL | 3 refills | Status: DC | PRN

## 2018-09-18 NOTE — Telephone Encounter (Signed)
LVM- potassium low at 2.8  I sent in potassium 20 meq. I want her to take daily for a week. Team please set up a repeat in 1 week. I am thinking possibly every other day potassium after we get the levels back up since she is on a low dose of lasix at 20mg  daily   This is probably related to lasix that was started- I am going to cc Dr. [name] on this in case he prefers a different regimen or follow up for her for this as he prescribed the lasix.

## 2018-09-18 NOTE — Telephone Encounter (Signed)
Perfect thanks

## 2018-09-18 NOTE — Assessment & Plan Note (Signed)
S: patient's reflux doing really well on zantac 300mg  before bed. She is concerned because of ndma concern/recall.  A/P: from avs "Trial famotidine IF your ranitidine is on recall. Check with your pharmacy"

## 2018-09-18 NOTE — Patient Instructions (Addendum)
Health Maintenance Due  Topic Date Due  . INFLUENZA VACCINE -please call our office to schedule this in October/November 07/26/2018   Trial famotidine IF your ranitidine is on recall. Check with your pharmacy  Please stop by lab before you go

## 2018-09-18 NOTE — Progress Notes (Signed)
Subjective:  Adriana Hendricks is a 83 y.o. year old very pleasant female patient who presents for/with See problem oriented charting ROS- some shortness of breath, no chest pain. Having to catch breath even just walking into grocery store. No edema. No orthopnea palpitations, seasonal allergies, tremors noted.   Past Medical History-  Patient Active Problem List   Diagnosis Date Noted  . Severe tricuspid regurgitation by prior echocardiogram 08/29/2018    Priority: High  . Persistent atrial fibrillation ([place]): CHA2DS2Vasc = 4 07/26/2018    Priority: High  . Osteopenia 03/13/2017    Priority: High  . Ocular migraine 03/13/2017    Priority: Medium  . OSA (obstructive sleep apnea) 12/21/2016    Priority: Medium  . Lower extremity neuropathy 08/23/2013    Priority: Medium  . Hyperlipemia 01/07/2010    Priority: Medium  . GASTROESOPHAGEAL REFLUX DISEASE 06/19/2009    Priority: Medium  . Essential tremor 11/16/2007    Priority: Medium  . Essential hypertension 11/16/2007    Priority: Medium  . Long term (current) use of anticoagulants 08/28/2018    Priority: Low  . IBS (irritable bowel syndrome) 09/12/2016    Priority: Low  . History of adenomatous polyp of colon 09/12/2016    Priority: Low  . Former smoker 09/12/2016    Priority: Low  . Carpal tunnel syndrome 09/04/2013    Priority: Low  . Achilles tendon tear 01/17/2011    Priority: Low  . ACNE ROSACEA 04/03/2008    Priority: Low  . DOE (dyspnea on exertion) 07/26/2018    Medications- reviewed and updated Current Outpatient Medications  Medication Sig Dispense Refill  . calcium carbonate (TUMS - DOSED IN MG ELEMENTAL CALCIUM) 500 MG chewable tablet Chew 2 tablets by mouth daily.    . Cholecalciferol (VITAMIN D3) 1000 units CAPS Take 1 capsule by mouth daily.    . furosemide (LASIX) 20 MG tablet Take 1 tablet (20 mg total) by mouth daily. 30 tablet 6  . Multiple Vitamins-Minerals (ICAPS AREDS 2) CAPS Take 1 capsule by mouth  daily.    . nadolol (CORGARD) 40 MG tablet Take 1 tablet (40 mg total) by mouth daily. 903 tablet 3  . Omega-3 1000 MG CAPS Take by mouth.    . ranitidine (ZANTAC) 300 MG capsule Take 1 capsule (300 mg total) by mouth 2 (two) times daily. (Patient taking differently: Take 300 mg by mouth every evening. ) 180 capsule 3  . warfarin (COUMADIN) 5 MG tablet Take 1 tablet (5 mg total) by mouth one time only at 6 PM. Or as directed.. 30 tablet 4   No current facility-administered medications for this visit.     Objective: BP 136/70 (BP Location: Left Arm, Patient Position: Sitting, Cuff Size: Normal)   Pulse 71   Temp 98.5 F (36.9 C) (Oral)   Ht 5\' 6"  (1.676 m)   Wt 166 lb 3.2 oz (75.4 kg)   LMP  (LMP Unknown)   SpO2 97%   BMI 26.83 kg/m  Gen: NAD, resting comfortably CV: irregularly irregular no rubs or gallops Lungs: CTAB no crackles, wheeze, rhonchi Abdomen: soft/nontender/nondistended/normal bowel sounds. No rebound or guarding.  Ext: no edema Skin: warm, dry Neuro: essential tremor noted  Assessment/Plan:  Other notes: 1. November marriage granddaughter- nags head- they are excited about this!  2. Given a fib and patient reports history underactive thyroid in past- will get tsh, t3, t3  GASTROESOPHAGEAL REFLUX DISEASE S: patient's reflux doing really well on zantac 300mg  before bed. She  is concerned because of ndma concern/recall.  A/P: from avs "Trial famotidine IF your ranitidine is on recall. Check with your pharmacy"  Essential hypertension S: controlled on  lasix 20mg , nadolol 40mg  BP Readings from Last 3 Encounters:  09/18/18 136/70  08/29/18 122/72  08/28/18 132/82  A/P: We discussed blood pressure goal of <140/90. Continue current meds  Persistent atrial fibrillation ([place]): CHA2DS2Vasc = 4 S: remains on coumadin- working on getting INR in right place. upcoming cardioversion- she is not optimistic that it will work  A/P: continue coumadin for anticoagulation and  nadolol for rate control  Hyperlipemia S: mild poorly controlled on no statin Lab Results  Component Value Date   CHOL 192 03/14/2017   HDL 62.00 03/14/2017   LDLCALC 111 (H) 03/14/2017   LDLDIRECT 128.8 01/17/2011   TRIG 94.0 03/14/2017   CHOLHDL 3 03/14/2017   A/P: given age and no CAD/CVA/PAD history would not start statin- patient also wouldn't be interested for primary prevention. She would like to know where levels are thoguh    Osteopenia  t score -1.28 September 2017 bilateral hip. Repeat 2 years- she prefers to stay off fosamax   Future Appointments  Date Time Provider [place]  09/19/2018 11:30 AM [facility]  09/26/2018  3:15 PM [facility]  09/26/2018  3:30 PM Erlene Quan, PA-C [facility]  10/18/2018  2:30 PM Sherran Needs, NP [facility] None  11/27/2018  2:20 PM Leonie Man, MD [facility]  03/19/2019  1:20 PM Marin Olp, MD [facility]   6 month follow up discussed   Lab/Order associations: Persistent atrial fibrillation ([place]): CHA2DS2Vasc = 4  Hyperlipidemia, unspecified hyperlipidemia type - Plan: TSH, CBC, Comprehensive metabolic panel, Lipid panel  History of hypothyroidism - Plan: TSH, T4, free, T3, free  Gastroesophageal reflux disease, esophagitis presence not specified  Essential hypertension  Osteopenia of left thigh  Return precautions advised.  Garret Reddish, MD

## 2018-09-18 NOTE — Telephone Encounter (Signed)
Sorry - type-O  I meant Lets - stop Lasix.  Will contact her in AM to double up Potassium & get BMP in a couple days.  Probably will not restart lasix -- hopefully that will resolve the issue.  Glenetta Hew, MD

## 2018-09-18 NOTE — Telephone Encounter (Signed)
Dr. Yong Channel aware

## 2018-09-18 NOTE — Assessment & Plan Note (Signed)
S: controlled on  lasix 20mg , nadolol 40mg  BP Readings from Last 3 Encounters:  09/18/18 136/70  08/29/18 122/72  08/28/18 132/82  A/P: We discussed blood pressure goal of <140/90. Continue current meds

## 2018-09-18 NOTE — Assessment & Plan Note (Signed)
t score -1.28 September 2017 bilateral hip. Repeat 2 years- she prefers to stay off fosamax

## 2018-09-18 NOTE — Telephone Encounter (Signed)
Dr. [name]- wasn't suggesting stopping the lasix. Sorry if I seemed to imply that.   Since you are managing the lasix, I think it makes sense for you to manage the potassium repletion so I will defer to you. Can you please contact the patient about medication adjustments/orders/future labs through your team?   Thanks so much, Garret Reddish

## 2018-09-18 NOTE — Assessment & Plan Note (Signed)
S: remains on coumadin- working on getting INR in right place. upcoming cardioversion- she is not optimistic that it will work  A/P: continue coumadin for anticoagulation and nadolol for rate control

## 2018-09-18 NOTE — Telephone Encounter (Signed)
Less likely to stop Lasix, continue to replete potassium may need more than just 20 mg daily.  Probably would be due 40 twice daily for couple days. We can make Lasix and as needed medicine for swelling or dyspnea.  Glenetta Hew, MD

## 2018-09-18 NOTE — Telephone Encounter (Signed)
Ivin Booty - can we contact Ms. Morrow -- have her d/c Lasix.  Take 2 of the Potassium 20 mEEq tabs  - 2 x daily x 1 day (I.e. 40 mEq bid) the 40 mEq daily Thursday & Friday - will need recheck of BMP on Friday.  Glenetta Hew, MD

## 2018-09-18 NOTE — Assessment & Plan Note (Signed)
S: mild poorly controlled on no statin Lab Results  Component Value Date   CHOL 192 03/14/2017   HDL 62.00 03/14/2017   LDLCALC 111 (H) 03/14/2017   LDLDIRECT 128.8 01/17/2011   TRIG 94.0 03/14/2017   CHOLHDL 3 03/14/2017   A/P: given age and no CAD/CVA/PAD history would not start statin- patient also wouldn't be interested for primary prevention. She would like to know where levels are thoguh

## 2018-09-18 NOTE — Telephone Encounter (Signed)
[facility] lab called with critical result on pt. Potassium 2.8

## 2018-09-19 ENCOUNTER — Ambulatory Visit (INDEPENDENT_AMBULATORY_CARE_PROVIDER_SITE_OTHER): Payer: Medicare Other | Admitting: Pharmacist

## 2018-09-19 ENCOUNTER — Telehealth: Payer: Self-pay | Admitting: Pharmacist

## 2018-09-19 DIAGNOSIS — I481 Persistent atrial fibrillation: Secondary | ICD-10-CM | POA: Diagnosis not present

## 2018-09-19 DIAGNOSIS — Z7901 Long term (current) use of anticoagulants: Secondary | ICD-10-CM

## 2018-09-19 DIAGNOSIS — I4819 Other persistent atrial fibrillation: Secondary | ICD-10-CM

## 2018-09-19 LAB — POCT INR: INR: 2.3 (ref 2.0–3.0)

## 2018-09-19 NOTE — Telephone Encounter (Signed)
Please see BMET results for 09/18/18.  K = 2.8 ; PCP sent Rx for supplement and defer further furosemide adjustment to DR [name].   *During coumadin visit today, patient was instructed to continue potassium supplement as prescribed by PCP and HOLD furosemide for now. She denies swelling, fatigue, increased palpitations, etc*   Please call patient back ASAP to address furosemide instructions.

## 2018-09-20 NOTE — Telephone Encounter (Signed)
SPOKE TO PATIENT. SHE STATES HAS BEEN TAKING 20 MEQ DAILY FOR THE LAST 2 DAYS. AND STOPPED LASIX .   RN  INFORMED  PATIENT TO TAKE 40 MEQ THIS MORNING AND  THIS EVENING AND IN THE MORNING. BMP on Friday 09/21/18 AFTERNOON.  PATIENT AWARE,verbalized understanding. Lab ordered.  see previous note 09/18/18.

## 2018-09-20 NOTE — Telephone Encounter (Signed)
SPOKE TO PATIENT. SHE STATES HAS BEEN TAKING 20 MEQ DAILY FOR THE LAST 2 DAYS. AND STOPPED LASIX .   RN  INFORMED  PATIENT TO TAKE 40 MEQ THIS MORNING AND  THIS EVENING AND IN THE MORNING. BMP on Friday 09/21/18 AFTERNOON.  PATIENT AWARE,verbalized understanding. Lab ordered.

## 2018-09-20 NOTE — Addendum Note (Signed)
Addended by: Raiford Simmonds on: 09/20/2018 08:50 AM   Modules accepted: Orders

## 2018-09-21 ENCOUNTER — Other Ambulatory Visit: Payer: Self-pay | Admitting: *Deleted

## 2018-09-21 DIAGNOSIS — E876 Hypokalemia: Secondary | ICD-10-CM | POA: Diagnosis not present

## 2018-09-22 LAB — BASIC METABOLIC PANEL
BUN/Creatinine Ratio: 27 (ref 12–28)
BUN: 22 mg/dL (ref 8–27)
CALCIUM: 9.4 mg/dL (ref 8.7–10.3)
CO2: 28 mmol/L (ref 20–29)
CREATININE: 0.82 mg/dL (ref 0.57–1.00)
Chloride: 92 mmol/L — ABNORMAL LOW (ref 96–106)
GFR calc Af Amer: 78 mL/min/{1.73_m2} (ref >59)
GFR, EST NON AFRICAN AMERICAN: 67 mL/min/{1.73_m2} (ref >59)
Glucose: 107 mg/dL — ABNORMAL HIGH (ref 65–99)
POTASSIUM: 4.2 mmol/L (ref 3.5–5.2)
SODIUM: 133 mmol/L — AB (ref 134–144)

## 2018-09-26 ENCOUNTER — Ambulatory Visit (INDEPENDENT_AMBULATORY_CARE_PROVIDER_SITE_OTHER): Payer: Medicare Other | Admitting: Pharmacist

## 2018-09-26 ENCOUNTER — Encounter: Payer: Self-pay | Admitting: Cardiology

## 2018-09-26 ENCOUNTER — Ambulatory Visit (INDEPENDENT_AMBULATORY_CARE_PROVIDER_SITE_OTHER): Payer: Medicare Other | Admitting: Cardiology

## 2018-09-26 VITALS — BP 132/80 | HR 94 | Ht 60.0 in | Wt 171.0 lb

## 2018-09-26 DIAGNOSIS — Z7901 Long term (current) use of anticoagulants: Secondary | ICD-10-CM | POA: Diagnosis not present

## 2018-09-26 DIAGNOSIS — I4819 Other persistent atrial fibrillation: Secondary | ICD-10-CM | POA: Diagnosis not present

## 2018-09-26 DIAGNOSIS — K219 Gastro-esophageal reflux disease without esophagitis: Secondary | ICD-10-CM

## 2018-09-26 DIAGNOSIS — G4733 Obstructive sleep apnea (adult) (pediatric): Secondary | ICD-10-CM

## 2018-09-26 DIAGNOSIS — K222 Esophageal obstruction: Secondary | ICD-10-CM | POA: Diagnosis not present

## 2018-09-26 DIAGNOSIS — I071 Rheumatic tricuspid insufficiency: Secondary | ICD-10-CM

## 2018-09-26 LAB — POCT INR: INR: 3.5 — AB (ref 2.0–3.0)

## 2018-09-26 NOTE — Assessment & Plan Note (Signed)
On Zantac 

## 2018-09-26 NOTE — Assessment & Plan Note (Signed)
Coumadin-CHADS VASC=4

## 2018-09-26 NOTE — Assessment & Plan Note (Signed)
Noted Aug 2019 during a fib workup. Severe LAE as well

## 2018-09-26 NOTE — H&P (View-Only) (Signed)
09/26/2018 Adriana Hendricks   [birth year]  [id number]  Primary Physician [name], MD Primary Cardiologist: Dr [name]   HPI:  Patient is a pleasant  is a 83 y.o. married female, former smoker, with a history of hypertension and OSA on CPAP. She was seen by Dr Halford Chessman this July who noted an irregular heart rhythm. EKG confirmed AF with CVR. Her last EKG in 2014 showed NSR. She has no history of CAD, MI, or prior PAF. She had DOE but attributed it to her COPD. Echo 08/02/18 showed an EF of 55-60%, severe TR, and PA pressure of 30 mmHg. Myoview 08/01/18 was low risk. She saw dr [name] and was placed on Coumadin. She is in the office today for pre cardioversion work up. She is doing well, no palpitations but continued dyspnea.  Her INR has been > 2.0 for 4 weeks in a row. Her K+ was up to 4.2 on recent labs 9/27.     Current Outpatient Medications  Medication Sig Dispense Refill  . calcium carbonate (TUMS - DOSED IN MG ELEMENTAL CALCIUM) 500 MG chewable tablet Chew 1 tablet by mouth daily.     . Cholecalciferol (VITAMIN D3) 5000 units CAPS Take 5,000 Units by mouth daily.     . Menthol-Methyl Salicylate (MUSCLE RUB EX) Apply 1 application topically daily as needed (for pain).    . Multiple Vitamins-Minerals (ICAPS AREDS 2) CAPS Take 2 capsules by mouth daily.     . nadolol-bendroflumethiazide (CORZIDE) 40-5 MG tablet Take 1 tablet by mouth daily.    . Omega-3 Fatty Acids (FISH OIL) 1200 MG CAPS Take 1,200 mg by mouth daily.    . potassium chloride SA (K-DUR,KLOR-CON) 20 MEQ tablet Take 1 tablet (20 mEq total) by mouth daily as needed. (Patient taking differently: Take 20 mEq by mouth daily. ) 30 tablet 3  . ranitidine (ZANTAC) 300 MG capsule Take 1 capsule (300 mg total) by mouth 2 (two) times daily. (Patient taking differently: Take 300 mg by mouth at bedtime. ) 180 capsule 3  . warfarin (COUMADIN) 5 MG tablet Take 1 tablet (5 mg total) by mouth one time only at 6 PM. Or as directed.. (Patient  taking differently: Take 2.5-5 mg by mouth See admin instructions. Take 2.5 mg by mouth daily on Monday, Wednesday and Saturday. Take 5 mg by mouth daily on all other days.) 30 tablet 4  . furosemide (LASIX) 20 MG tablet Take 1 tablet (20 mg total) by mouth daily. (Patient not taking: Reported on 09/26/2018) 30 tablet 6  . nadolol (CORGARD) 40 MG tablet Take 1 tablet (40 mg total) by mouth daily. (Patient not taking: Reported on 09/26/2018) 903 tablet 3   No current facility-administered medications for this visit.     Allergies  Allergen Reactions  . Clarithromycin Nausea Only  . Erythromycin Base Nausea And Vomiting  . Levofloxacin Other (See Comments)    Patient has a history of a Achilles tendon tear and developed tendon pain while on medication  . Metronidazole Rash  . Penicillins Rash and Other (See Comments)    Has patient had a PCN reaction causing immediate rash, facial/tongue/throat swelling, SOB or lightheadedness with hypotension: Unknown Has patient had a PCN reaction causing severe rash involving mucus membranes or skin necrosis: No Has patient had a PCN reaction that required hospitalization: Yes Has patient had a PCN reaction occurring within the last 10 years: No If all of the above answers are "NO", then may proceed with Cephalosporin  use.     Past Medical History:  Diagnosis Date  . Achilles tendon rupture   . Arthritis of hand, right   . Carpal tunnel syndrome 09/04/2013  . Collagenous colitis 2005  . Colon polyps 2010   Tubular adenoma and hyperplastic  . Dental infection 10/25/2014  . Diverticulosis   . Esophageal stenosis   . Essential tremor   . GERD (gastroesophageal reflux disease)    hx hiatal hernia, hx esophagitis, hx stricture  . Heart murmur   . Hiatal hernia   . Hx: UTI (urinary tract infection)   . Hypertension   . Lower extremity neuropathy 08/23/2013   On B12 therapy with numbness in the feet bilaterally no evidence of diabetes   . Ocular  migraine    jagged vision, a few per month  . OSA (obstructive sleep apnea) 12/21/2016   on CPAP  . PAF (paroxysmal atrial fibrillation) ([place]) 06/2018   NEWLY DIAGNOSED 07/18/2018  . Peripheral neuropathy    treated by Dr Posey Pronto (07/2015)    Social History   Socioeconomic History  . Marital status: Married    Spouse name: Not on file  . Number of children: 2  . Years of education: Not on file  . Highest education level: Not on file  Occupational History  . Occupation: Retired  Scientific laboratory technician  . Financial resource strain: Not on file  . Food insecurity:    Worry: Not on file    Inability: Not on file  . Transportation needs:    Medical: Not on file    Non-medical: Not on file  Tobacco Use  . Smoking status: Former Smoker    Packs/day: 2.00    Years: 13.00    Pack years: 26.00    Types: Cigarettes    Start date: 12/26/1954    Last attempt to quit: 03/26/1968    Years since quitting: 50.5  . Smokeless tobacco: Never Used  . Tobacco comment: Quit in 1969 - smokeed 2-3PPD , was 3 PPD at her heaviest for about 3 years.   Substance and Sexual Activity  . Alcohol use: Yes    Alcohol/week: 1.0 standard drinks    Types: 1 Glasses of wine per week    Comment: 1 glass of wine per day  . Drug use: No  . Sexual activity: Yes  Lifestyle  . Physical activity:    Days per week: Not on file    Minutes per session: Not on file  . Stress: Not on file  Relationships  . Social connections:    Talks on phone: Not on file    Gets together: Not on file    Attends religious service: Not on file    Active member of club or organization: Not on file    Attends meetings of clubs or organizations: Not on file    Relationship status: Not on file  . Intimate partner violence:    Fear of current or ex partner: Not on file    Emotionally abused: Not on file    Physically abused: Not on file    Forced sexual activity: Not on file  Other Topics Concern  . Not on file  Social History Narrative    Married. Husband is patient of Dr. Yong Channel.    Married 1958. 2 children. 4 grandkids (3 girls, 1 boy).    Retired from [employer].   Hobbies: genealogy and DNA     Family History  Problem Relation Age of  Onset  . Rectal cancer Father        colostomy  . Heart failure Father        Died, 89  . Other Mother        Died, 76. old age.   . Barrett's esophagus Son   . Colon cancer Neg Hx   . Esophageal cancer Neg Hx   . Stomach cancer Neg Hx      Review of Systems: General: negative for chills, fever, night sweats or weight changes.  Cardiovascular: negative for chest pain, dyspnea on exertion, edema, orthopnea, palpitations, paroxysmal nocturnal dyspnea or shortness of breath Dermatological: negative for rash Respiratory: negative for cough or wheezing Urologic: negative for hematuria Abdominal: negative for nausea, vomiting, diarrhea, bright red blood per rectum, melena, or hematemesis Neurologic: negative for visual changes, syncope, or dizziness All other systems reviewed and are otherwise negative except as noted above.    Blood pressure 132/80, pulse 94, height 5' (1.524 m), weight 171 lb (77.6 kg), SpO2 94 %.  General appearance: alert, cooperative and no distress Neck: no carotid bruit and no JVD Lungs: clear to auscultation bilaterally Heart: irregularly irregular rhythm Abdomen: not distended Extremities: no edema Skin: warm and dry Neurologic: Grossly normal  EKG AF-VR 94  ASSESSMENT AND PLAN:   Atrial fibrillation, new onset ([place]) AF found on exam July 2019- unclear duration. Pt symptomatic with dyspnea, plan is for OP DCCV  Long term (current) use of anticoagulants Coumadin-CHADS VASC=4  OSA (obstructive sleep apnea) C-PAP  GERD with stricture On Zantac  Severe tricuspid regurgitation by prior echocardiogram Noted Aug 2019 during a fib workup. Severe LAE as well   PLAN  Discussed the risks and benefits of OP DCCV  and she is agreeable to proceed. She hopes this will help her dyspnea. She understands it may not be successful and that AF could re occur if it is successful.     Kerin Ransom PA-C 09/26/2018 4:19 PM

## 2018-09-26 NOTE — Assessment & Plan Note (Signed)
CPAP.  

## 2018-09-26 NOTE — Patient Instructions (Signed)
Medication Instructions:  Your physician recommends that you continue on your current medications as directed. Please refer to the Current Medication list given to you today.  If you need a refill on your cardiac medications before your next appointment, please call your pharmacy.  Labwork: None   Testing/Procedures: CARDIOVERSION INSTRUCTIONS ATTACHED    Follow-Up: FOLLOW UP AS SCHEDULED  Any Other Special Instructions Will Be Listed Below (If Applicable).

## 2018-09-26 NOTE — Progress Notes (Signed)
09/26/2018 Adriana Hendricks   04-May-1935  [id number]  Primary Physician [name], MD Primary Cardiologist: Dr [name]   HPI:  Patient is a pleasant  is a 83 y.o. married female, former smoker, with a history of hypertension and OSA on CPAP. She was seen by Dr Halford Chessman this July who noted an irregular heart rhythm. EKG confirmed AF with CVR. Her last EKG in 2014 showed NSR. She has no history of CAD, MI, or prior PAF. She had DOE but attributed it to her COPD. Echo 08/02/18 showed an EF of 55-60%, severe TR, and PA pressure of 30 mmHg. Myoview 08/01/18 was low risk. She saw dr [name] and was placed on Coumadin. She is in the office today for pre cardioversion work up. She is doing well, no palpitations but continued dyspnea.  Her INR has been > 2.0 for 4 weeks in a row. Her K+ was up to 4.2 on recent labs 9/27.     Current Outpatient Medications  Medication Sig Dispense Refill  . calcium carbonate (TUMS - DOSED IN MG ELEMENTAL CALCIUM) 500 MG chewable tablet Chew 1 tablet by mouth daily.     . Cholecalciferol (VITAMIN D3) 5000 units CAPS Take 5,000 Units by mouth daily.     . Menthol-Methyl Salicylate (MUSCLE RUB EX) Apply 1 application topically daily as needed (for pain).    . Multiple Vitamins-Minerals (ICAPS AREDS 2) CAPS Take 2 capsules by mouth daily.     . nadolol-bendroflumethiazide (CORZIDE) 40-5 MG tablet Take 1 tablet by mouth daily.    . Omega-3 Fatty Acids (FISH OIL) 1200 MG CAPS Take 1,200 mg by mouth daily.    . potassium chloride SA (K-DUR,KLOR-CON) 20 MEQ tablet Take 1 tablet (20 mEq total) by mouth daily as needed. (Patient taking differently: Take 20 mEq by mouth daily. ) 30 tablet 3  . ranitidine (ZANTAC) 300 MG capsule Take 1 capsule (300 mg total) by mouth 2 (two) times daily. (Patient taking differently: Take 300 mg by mouth at bedtime. ) 180 capsule 3  . warfarin (COUMADIN) 5 MG tablet Take 1 tablet (5 mg total) by mouth one time only at 6 PM. Or as directed.. (Patient  taking differently: Take 2.5-5 mg by mouth See admin instructions. Take 2.5 mg by mouth daily on Monday, Wednesday and Saturday. Take 5 mg by mouth daily on all other days.) 30 tablet 4  . furosemide (LASIX) 20 MG tablet Take 1 tablet (20 mg total) by mouth daily. (Patient not taking: Reported on 09/26/2018) 30 tablet 6  . nadolol (CORGARD) 40 MG tablet Take 1 tablet (40 mg total) by mouth daily. (Patient not taking: Reported on 09/26/2018) 903 tablet 3   No current facility-administered medications for this visit.     Allergies  Allergen Reactions  . Clarithromycin Nausea Only  . Erythromycin Base Nausea And Vomiting  . Levofloxacin Other (See Comments)    Patient has a history of a Achilles tendon tear and developed tendon pain while on medication  . Metronidazole Rash  . Penicillins Rash and Other (See Comments)    Has patient had a PCN reaction causing immediate rash, facial/tongue/throat swelling, SOB or lightheadedness with hypotension: Unknown Has patient had a PCN reaction causing severe rash involving mucus membranes or skin necrosis: No Has patient had a PCN reaction that required hospitalization: Yes Has patient had a PCN reaction occurring within the last 10 years: No If all of the above answers are "NO", then may proceed with Cephalosporin  use.     Past Medical History:  Diagnosis Date  . Achilles tendon rupture   . Arthritis of hand, right   . Carpal tunnel syndrome 09/04/2013  . Collagenous colitis 2005  . Colon polyps 2010   Tubular adenoma and hyperplastic  . Dental infection 10/25/2014  . Diverticulosis   . Esophageal stenosis   . Essential tremor   . GERD (gastroesophageal reflux disease)    hx hiatal hernia, hx esophagitis, hx stricture  . Heart murmur   . Hiatal hernia   . Hx: UTI (urinary tract infection)   . Hypertension   . Lower extremity neuropathy 08/23/2013   On B12 therapy with numbness in the feet bilaterally no evidence of diabetes   . Ocular  migraine    jagged vision, a few per month  . OSA (obstructive sleep apnea) 12/21/2016   on CPAP  . PAF (paroxysmal atrial fibrillation) ([place]) 06/2018   NEWLY DIAGNOSED 07/18/2018  . Peripheral neuropathy    treated by Dr Posey Pronto (07/2015)    Social History   Socioeconomic History  . Marital status: Married    Spouse name: Not on file  . Number of children: 2  . Years of education: Not on file  . Highest education level: Not on file  Occupational History  . Occupation: Retired  Scientific laboratory technician  . Financial resource strain: Not on file  . Food insecurity:    Worry: Not on file    Inability: Not on file  . Transportation needs:    Medical: Not on file    Non-medical: Not on file  Tobacco Use  . Smoking status: Former Smoker    Packs/day: 2.00    Years: 13.00    Pack years: 26.00    Types: Cigarettes    Start date: 12/26/1954    Last attempt to quit: 03/26/1968    Years since quitting: 50.5  . Smokeless tobacco: Never Used  . Tobacco comment: Quit in 1969 - smokeed 2-3PPD , was 3 PPD at her heaviest for about 3 years.   Substance and Sexual Activity  . Alcohol use: Yes    Alcohol/week: 1.0 standard drinks    Types: 1 Glasses of wine per week    Comment: 1 glass of wine per day  . Drug use: No  . Sexual activity: Yes  Lifestyle  . Physical activity:    Days per week: Not on file    Minutes per session: Not on file  . Stress: Not on file  Relationships  . Social connections:    Talks on phone: Not on file    Gets together: Not on file    Attends religious service: Not on file    Active member of club or organization: Not on file    Attends meetings of clubs or organizations: Not on file    Relationship status: Not on file  . Intimate partner violence:    Fear of current or ex partner: Not on file    Emotionally abused: Not on file    Physically abused: Not on file    Forced sexual activity: Not on file  Other Topics Concern  . Not on file  Social History Narrative    Married. Husband is patient of Dr. Yong Channel.    Married 1958. 2 children. 4 grandkids (3 girls, 1 boy).    Retired from [employer].   Hobbies: genealogy and DNA     Family History  Problem Relation Age of  Onset  . Rectal cancer Father        colostomy  . Heart failure Father        Died, 89  . Other Mother        Died, 6. old age.   . Barrett's esophagus Son   . Colon cancer Neg Hx   . Esophageal cancer Neg Hx   . Stomach cancer Neg Hx      Review of Systems: General: negative for chills, fever, night sweats or weight changes.  Cardiovascular: negative for chest pain, dyspnea on exertion, edema, orthopnea, palpitations, paroxysmal nocturnal dyspnea or shortness of breath Dermatological: negative for rash Respiratory: negative for cough or wheezing Urologic: negative for hematuria Abdominal: negative for nausea, vomiting, diarrhea, bright red blood per rectum, melena, or hematemesis Neurologic: negative for visual changes, syncope, or dizziness All other systems reviewed and are otherwise negative except as noted above.    Blood pressure 132/80, pulse 94, height 5' (1.524 m), weight 171 lb (77.6 kg), SpO2 94 %.  General appearance: alert, cooperative and no distress Neck: no carotid bruit and no JVD Lungs: clear to auscultation bilaterally Heart: irregularly irregular rhythm Abdomen: not distended Extremities: no edema Skin: warm and dry Neurologic: Grossly normal  EKG AF-VR 94  ASSESSMENT AND PLAN:   Atrial fibrillation, new onset ([place]) AF found on exam July 2019- unclear duration. Pt symptomatic with dyspnea, plan is for OP DCCV  Long term (current) use of anticoagulants Coumadin-CHADS VASC=4  OSA (obstructive sleep apnea) C-PAP  GERD with stricture On Zantac  Severe tricuspid regurgitation by prior echocardiogram Noted Aug 2019 during a fib workup. Severe LAE as well   PLAN  Discussed the risks and benefits of OP DCCV  and she is agreeable to proceed. She hopes this will help her dyspnea. She understands it may not be successful and that AF could re occur if it is successful.     Kerin Ransom PA-C 09/26/2018 4:19 PM

## 2018-09-26 NOTE — Assessment & Plan Note (Signed)
AF found on exam July 2019- unclear duration. Pt symptomatic with dyspnea, plan is for OP DCCV

## 2018-10-02 ENCOUNTER — Encounter (HOSPITAL_COMMUNITY): Payer: Self-pay | Admitting: *Deleted

## 2018-10-02 ENCOUNTER — Ambulatory Visit (HOSPITAL_COMMUNITY): Payer: Medicare Other | Admitting: Registered Nurse

## 2018-10-02 ENCOUNTER — Ambulatory Visit (HOSPITAL_COMMUNITY)
Admission: RE | Admit: 2018-10-02 | Discharge: 2018-10-02 | Disposition: A | Discharge status: Home / Self Care | Payer: Medicare Other | Source: Ambulatory Visit | Attending: Cardiovascular Disease | Admitting: Cardiovascular Disease

## 2018-10-02 ENCOUNTER — Encounter (HOSPITAL_COMMUNITY)
Admission: RE | Disposition: A | Discharge status: Home / Self Care | Payer: Self-pay | Source: Ambulatory Visit | Attending: Cardiovascular Disease

## 2018-10-02 ENCOUNTER — Other Ambulatory Visit: Payer: Self-pay

## 2018-10-02 DIAGNOSIS — Z87891 Personal history of nicotine dependence: Secondary | ICD-10-CM | POA: Insufficient documentation

## 2018-10-02 DIAGNOSIS — I071 Rheumatic tricuspid insufficiency: Secondary | ICD-10-CM | POA: Insufficient documentation

## 2018-10-02 DIAGNOSIS — Z8249 Family history of ischemic heart disease and other diseases of the circulatory system: Secondary | ICD-10-CM | POA: Diagnosis not present

## 2018-10-02 DIAGNOSIS — G250 Essential tremor: Secondary | ICD-10-CM | POA: Insufficient documentation

## 2018-10-02 DIAGNOSIS — G5600 Carpal tunnel syndrome, unspecified upper limb: Secondary | ICD-10-CM | POA: Diagnosis not present

## 2018-10-02 DIAGNOSIS — Z880 Allergy status to penicillin: Secondary | ICD-10-CM | POA: Diagnosis not present

## 2018-10-02 DIAGNOSIS — Z79899 Other long term (current) drug therapy: Secondary | ICD-10-CM | POA: Diagnosis not present

## 2018-10-02 DIAGNOSIS — G629 Polyneuropathy, unspecified: Secondary | ICD-10-CM | POA: Diagnosis not present

## 2018-10-02 DIAGNOSIS — I4819 Other persistent atrial fibrillation: Secondary | ICD-10-CM

## 2018-10-02 DIAGNOSIS — I10 Essential (primary) hypertension: Secondary | ICD-10-CM | POA: Insufficient documentation

## 2018-10-02 DIAGNOSIS — Z87440 Personal history of urinary (tract) infections: Secondary | ICD-10-CM | POA: Diagnosis not present

## 2018-10-02 DIAGNOSIS — Z7901 Long term (current) use of anticoagulants: Secondary | ICD-10-CM | POA: Diagnosis not present

## 2018-10-02 DIAGNOSIS — Z8719 Personal history of other diseases of the digestive system: Secondary | ICD-10-CM | POA: Diagnosis not present

## 2018-10-02 DIAGNOSIS — Z881 Allergy status to other antibiotic agents status: Secondary | ICD-10-CM | POA: Diagnosis not present

## 2018-10-02 DIAGNOSIS — I482 Chronic atrial fibrillation, unspecified: Secondary | ICD-10-CM

## 2018-10-02 DIAGNOSIS — J449 Chronic obstructive pulmonary disease, unspecified: Secondary | ICD-10-CM | POA: Diagnosis not present

## 2018-10-02 DIAGNOSIS — Z86010 Personal history of colonic polyps: Secondary | ICD-10-CM | POA: Diagnosis not present

## 2018-10-02 DIAGNOSIS — G4733 Obstructive sleep apnea (adult) (pediatric): Secondary | ICD-10-CM | POA: Diagnosis not present

## 2018-10-02 DIAGNOSIS — I4891 Unspecified atrial fibrillation: Secondary | ICD-10-CM | POA: Diagnosis not present

## 2018-10-02 DIAGNOSIS — K219 Gastro-esophageal reflux disease without esophagitis: Secondary | ICD-10-CM | POA: Insufficient documentation

## 2018-10-02 DIAGNOSIS — I480 Paroxysmal atrial fibrillation: Secondary | ICD-10-CM | POA: Insufficient documentation

## 2018-10-02 HISTORY — PX: CARDIOVERSION: SHX1299

## 2018-10-02 SURGERY — CARDIOVERSION
Anesthesia: General

## 2018-10-02 MED ORDER — NADOLOL 40 MG PO TABS: 20.0000 mg | ORAL_TABLET | Freq: Every day | ORAL | 3 refills | Status: DC

## 2018-10-02 MED ORDER — PROPOFOL 10 MG/ML IV BOLUS
INTRAVENOUS | Status: DC | PRN
  Administered 2018-10-02: 50 mg via INTRAVENOUS
  Administered 2018-10-02: 30 mg via INTRAVENOUS

## 2018-10-02 MED ORDER — SODIUM CHLORIDE 0.9 % IV SOLN
INTRAVENOUS | Status: DC
  Administered 2018-10-02: 500 mL via INTRAVENOUS

## 2018-10-02 MED ORDER — LIDOCAINE 2% (20 MG/ML) 5 ML SYRINGE
INTRAMUSCULAR | Status: DC | PRN
  Administered 2018-10-02: 60 mg via INTRAVENOUS

## 2018-10-02 NOTE — Anesthesia Postprocedure Evaluation (Signed)
Anesthesia Post Note  Patient: Adriana Hendricks  Procedure(s) Performed: CARDIOVERSION (N/A )     Patient location during evaluation: PACU Anesthesia Type: General Level of consciousness: awake and alert Pain management: pain level controlled Vital Signs Assessment: post-procedure vital signs reviewed and stable Respiratory status: spontaneous breathing, nonlabored ventilation, respiratory function stable and patient connected to nasal cannula oxygen Cardiovascular status: blood pressure returned to baseline and stable Postop Assessment: no apparent nausea or vomiting Anesthetic complications: no    Last Vitals:  Vitals:   10/02/18 1245 10/02/18 1255  BP: 110/67 118/83  Pulse: (!) 49 (!) 46  Resp: 19 15  Temp:    SpO2: 99% 100%    Last Pain:  Vitals:   10/02/18 1255  TempSrc:   PainSc: 0-No pain                 Ryan P Ellender

## 2018-10-02 NOTE — Interval H&P Note (Signed)
History and Physical Interval Note:  10/02/2018 11:57 AM  Adriana Hendricks  has presented today for surgery, with the diagnosis of AFIB  The various methods of treatment have been discussed with the patient and family. After consideration of risks, benefits and other options for treatment, the patient has consented to  Procedure(s): CARDIOVERSION (N/A) as a surgical intervention .  The patient's history has been reviewed, patient examined, no change in status, stable for surgery.  I have reviewed the patient's chart and labs.  Questions were answered to the patient's satisfaction.     [name]

## 2018-10-02 NOTE — Transfer of Care (Signed)
Immediate Anesthesia Transfer of Care Note  Patient: Adriana Hendricks  Procedure(s) Performed: CARDIOVERSION (N/A )  Patient Location: PACU and Endoscopy Unit  Anesthesia Type:General  Level of Consciousness: awake, alert  and oriented  Airway & Oxygen Therapy: Patient Spontanous Breathing  Post-op Assessment: Report given to RN and Post -op Vital signs reviewed and stable  Post vital signs: Reviewed and stable  Last Vitals:  Vitals Value Taken Time  BP 97/53 10/02/2018 12:19 PM  Temp    Pulse 46 10/02/2018 12:20 PM  Resp 17 10/02/2018 12:20 PM  SpO2 100 % 10/02/2018 12:20 PM    Last Pain:  Vitals:   10/02/18 1124  TempSrc: Oral  PainSc: 0-No pain         Complications: No apparent anesthesia complications

## 2018-10-02 NOTE — Anesthesia Preprocedure Evaluation (Addendum)
Anesthesia Evaluation  Patient identified by MRN, date of birth, ID band Patient awake    Reviewed: Allergy & Precautions, NPO status , Patient's Chart, lab work & pertinent test results  Airway Mallampati: III  TM Distance: >3 FB Neck ROM: Full    Dental no notable dental hx.    Pulmonary sleep apnea , former smoker,    Pulmonary exam normal breath sounds clear to auscultation       Cardiovascular hypertension, Pt. on home beta blockers Normal cardiovascular exam Rhythm:Regular Rate:Normal  ECG: A-fib, rate 94  ECHO: LV EF: 60% -   65%   Neuro/Psych  Headaches, negative psych ROS   GI/Hepatic Neg liver ROS, hiatal hernia, GERD  Medicated and Controlled,  Endo/Other  negative endocrine ROS  Renal/GU negative Renal ROS     Musculoskeletal negative musculoskeletal ROS (+)   Abdominal (+) + obese,   Peds  Hematology negative hematology ROS (+)   Anesthesia Other Findings A-FIB  Reproductive/Obstetrics                            Anesthesia Physical Anesthesia Plan  ASA: III  Anesthesia Plan: General   Post-op Pain Management:    Induction: Intravenous  PONV Risk Score and Plan: 3 and Propofol infusion and Treatment may vary due to age or medical condition  Airway Management Planned: Mask  Additional Equipment:   Intra-op Plan:   Post-operative Plan:   Informed Consent: I have reviewed the patients History and Physical, chart, labs and discussed the procedure including the risks, benefits and alternatives for the proposed anesthesia with the patient or authorized representative who has indicated his/her understanding and acceptance.   Dental advisory given  Plan Discussed with: CRNA  Anesthesia Plan Comments:         Anesthesia Quick Evaluation

## 2018-10-02 NOTE — Anesthesia Procedure Notes (Signed)
Procedure Name: General with mask airway Date/Time: 10/02/2018 12:15 PM Performed by: Trinna Post., CRNA Pre-anesthesia Checklist: Patient identified, Emergency Drugs available, Suction available, Patient being monitored and Timeout performed Patient Re-evaluated:Patient Re-evaluated prior to induction Oxygen Delivery Method: Ambu bag Preoxygenation: Pre-oxygenation with 100% oxygen Induction Type: IV induction

## 2018-10-02 NOTE — Discharge Instructions (Signed)
Electrical Cardioversion, Care After °This sheet gives you information about how to care for yourself after your procedure. Your health care provider may also give you more specific instructions. If you have problems or questions, contact your health care provider. °What can I expect after the procedure? °After the procedure, it is common to have: °· Some redness on the skin where the shocks were given. ° °Follow these instructions at home: °· Do not drive for 24 hours if you were given a medicine to help you relax (sedative). °· Take over-the-counter and prescription medicines only as told by your health care provider. °· Ask your health care provider how to check your pulse. Check it often. °· Rest for 48 hours after the procedure or as told by your health care provider. °· Avoid or limit your caffeine use as told by your health care provider. °Contact a health care provider if: °· You feel like your heart is beating too quickly or your pulse is not regular. °· You have a serious muscle cramp that does not go away. °Get help right away if: °· You have discomfort in your chest. °· You are dizzy or you feel faint. °· You have trouble breathing or you are short of breath. °· Your speech is slurred. °· You have trouble moving an arm or leg on one side of your body. °· Your fingers or toes turn cold or blue. °This information is not intended to replace advice given to you by your health care provider. Make sure you discuss any questions you have with your health care provider. °Document Released: 10/02/2013 Document Revised: 07/15/2016 Document Reviewed: 06/17/2016 °Elsevier Interactive Patient Education © 2018 [employer] ° °

## 2018-10-02 NOTE — CV Procedure (Signed)
    Cardioversion Note  CALLAWAY HARDIGREE [id number] [birth year]  Procedure: DC Cardioversion Indications: atrial fib   Procedure Details Consent: Obtained Time Out: Verified patient identification, verified procedure, site/side was marked, verified correct patient position, special equipment/implants available, Radiology Safety Procedures followed,  medications/allergies/relevent history reviewed, required imaging and test results available.  Performed  The patient has been on adequate anticoagulation.  The patient received IV Lidocaine 60 mg followed by Propofol 80 mg IV  for sedation.  Synchronous cardioversion was performed at 120  joules.  The cardioversion was successful .     Complications: No apparent complications Patient did tolerate procedure well.   Thayer Headings, Brooke Bonito., MD, [facility] 10/02/2018, 12:33 PM

## 2018-10-03 ENCOUNTER — Telehealth: Payer: Self-pay | Admitting: Family Medicine

## 2018-10-03 NOTE — Telephone Encounter (Signed)
Called and spoke with patient. She asked to come in tomorrow morning. I placed her on Samantha Worley's schedule.

## 2018-10-03 NOTE — Telephone Encounter (Signed)
Copied from [place]. Topic: Quick Communication - See Telephone Encounter >> Oct 03, 2018 10:28 AM Valla Leaver wrote: CRM for notification. See Telephone encounter for: 10/03/18. Patient has welts and itching rash that started after beginning potassium chloride SA (K-DUR,KLOR-CON) 20 MEQ tablet and would like a call back to discuss alternate script. She hasn't taken it and will wait for a call back.  Patient also said she had a cardioversion yesterday and she is no longer in afib.

## 2018-10-04 ENCOUNTER — Ambulatory Visit (INDEPENDENT_AMBULATORY_CARE_PROVIDER_SITE_OTHER): Payer: Medicare Other | Admitting: Physician Assistant

## 2018-10-04 ENCOUNTER — Encounter: Payer: Self-pay | Admitting: Physician Assistant

## 2018-10-04 VITALS — BP 138/76 | HR 54 | Temp 99.2°F | Ht 60.0 in | Wt 171.2 lb

## 2018-10-04 DIAGNOSIS — R21 Rash and other nonspecific skin eruption: Secondary | ICD-10-CM

## 2018-10-04 DIAGNOSIS — E876 Hypokalemia: Secondary | ICD-10-CM | POA: Diagnosis not present

## 2018-10-04 LAB — BASIC METABOLIC PANEL
BUN: 17 mg/dL (ref 6–23)
CALCIUM: 9.5 mg/dL (ref 8.4–10.5)
CO2: 29 mEq/L (ref 19–32)
Chloride: 99 mEq/L (ref 96–112)
Creatinine, Ser: 0.93 mg/dL (ref 0.40–1.20)
GFR: 61.46 mL/min (ref >60.00)
GLUCOSE: 104 mg/dL — AB (ref 70–99)
Potassium: 4 mEq/L (ref 3.5–5.1)
SODIUM: 136 meq/L (ref 135–145)

## 2018-10-04 NOTE — Progress Notes (Signed)
Adriana Hendricks is a 83 y.o. female here for a new problem.  I acted as a Education administrator for [employer], PA-C Anselmo Pickler, LPN  History of Present Illness:   Chief Complaint  Patient presents with  . Allergic Reaction    Allergic Reaction  This is a new (Pt broke out in hives three days after starting Potassium. Pt is still taking the medication.) problem. Episode onset: Started 10 days ago. Episode frequency: pt has hives everymorning and then go down by the end of the day. The problem has been waxing and waning since onset. The problem is moderate. The patient was exposed to a prescription drug (Potassium). The exposure occurred at home. Associated symptoms include itching. Pertinent negatives include no chest pain, chest pressure, coughing, diarrhea, difficulty breathing, eye itching, eye redness, eye watering or trouble swallowing. (Hives) The swelling is diffuse. Past treatments include nothing. Her past medical history is significant for medication allergies.    Patient started a potassium supplement about 2 weeks ago when her potassium came back critically low at 2.8. She was told to stop her lasix and start potassium supplement. Three days after starting her potassium supplement she noticed hives. Hives go away by the end of the day and present after taking the potassium supplement. Hives are on trunk, back and inner thighs. She has not tried any medication for relief, such as an antihistamine. She states that this is how all of her other drug allergies have presented in the past.  She also reports that she has "two more weeks" of Corzide that she has yet to take and then will transition to Nadolol. She wasn't instructed to stop her Corzide and abruptly changed to Nadolol.  Denies: chest pain, chest pressure, coughing, diarrhea, difficulty breathing, eye breathing  Past Medical History:  Diagnosis Date  . Achilles tendon rupture   . Arthritis of hand, right   . Carpal tunnel syndrome  09/04/2013  . Collagenous colitis 2005  . Colon polyps 2010   Tubular adenoma and hyperplastic  . Dental infection 10/25/2014  . Diverticulosis   . Esophageal stenosis   . Essential tremor   . GERD (gastroesophageal reflux disease)    hx hiatal hernia, hx esophagitis, hx stricture  . Heart murmur   . Hiatal hernia   . Hx: UTI (urinary tract infection)   . Hypertension   . Lower extremity neuropathy 08/23/2013   On B12 therapy with numbness in the feet bilaterally no evidence of diabetes   . Ocular migraine    jagged vision, a few per month  . OSA (obstructive sleep apnea) 12/21/2016   on CPAP  . PAF (paroxysmal atrial fibrillation) ([place]) 06/2018   NEWLY DIAGNOSED 07/18/2018  . Peripheral neuropathy    treated by Dr Posey Pronto (07/2015)     Social History   Socioeconomic History  . Marital status: Married    Spouse name: Not on file  . Number of children: 2  . Years of education: Not on file  . Highest education level: Not on file  Occupational History  . Occupation: Retired  Scientific laboratory technician  . Financial resource strain: Not on file  . Food insecurity:    Worry: Not on file    Inability: Not on file  . Transportation needs:    Medical: Not on file    Non-medical: Not on file  Tobacco Use  . Smoking status: Former Smoker    Packs/day: 2.00    Years: 13.00    Pack years: 26.00  Types: Cigarettes    Start date: 12/26/1954    Last attempt to quit: 03/26/1968    Years since quitting: 50.5  . Smokeless tobacco: Never Used  . Tobacco comment: Quit in 1969 - smokeed 2-3PPD , was 3 PPD at her heaviest for about 3 years.   Substance and Sexual Activity  . Alcohol use: Yes    Alcohol/week: 1.0 standard drinks    Types: 1 Glasses of wine per week    Comment: 1 glass of wine per day  . Drug use: No  . Sexual activity: Yes  Lifestyle  . Physical activity:    Days per week: Not on file    Minutes per session: Not on file  . Stress: Not on file  Relationships  . Social  connections:    Talks on phone: Not on file    Gets together: Not on file    Attends religious service: Not on file    Active member of club or organization: Not on file    Attends meetings of clubs or organizations: Not on file    Relationship status: Not on file  . Intimate partner violence:    Fear of current or ex partner: Not on file    Emotionally abused: Not on file    Physically abused: Not on file    Forced sexual activity: Not on file  Other Topics Concern  . Not on file  Social History Narrative   Married. Husband is patient of Dr. Yong Channel.    Married 1958. 2 children. 4 grandkids (3 girls, 1 boy).    Retired from [employer].   Hobbies: genealogy and DNA    Past Surgical History:  Procedure Laterality Date  . 48 hr Holter Monitor  07/2018   Persistent Afib (rate 33 - 124 bpm)  . APPENDECTOMY    . CARDIOVERSION N/A 10/02/2018   Procedure: CARDIOVERSION;  Surgeon: Acie Fredrickson, Wonda Cheng, MD;  Location: [facility] ENDOSCOPY;  Service: Cardiovascular;  Laterality: N/A;  . CARPAL TUNNEL RELEASE    . CATARACT EXTRACTION    . EXCISION MORTON'S NEUROMA     Right foot  . INTRAOCULAR LENS IMPLANT, SECONDARY    . Moles removed    . MOUTH SURGERY     (For Exostosis)  . NUCLEAR  07/2018   EF 71 %. LOW RISK - no ischemia or Infarct.   Marland Kitchen ROTATOR CUFF REPAIR    . TONSILLECTOMY    . TRANSTHORACIC ECHOCARDIOGRAM  07/2018   Normal LV Fxn (EF 60-65%) no RWMA.  Severe LA dilation & Severe TR.    Family History  Problem Relation Age of Onset  . Rectal cancer Father        colostomy  . Heart failure Father        Died, 89  . Other Mother        Died, 56. old age.   . Barrett's esophagus Son   . Colon cancer Neg Hx   . Esophageal cancer Neg Hx   . Stomach cancer Neg Hx     Allergies  Allergen Reactions  . Clarithromycin Nausea Only  . Erythromycin Base Nausea And Vomiting  . Levofloxacin Other (See Comments)    Patient has a history of a Achilles  tendon tear and developed tendon pain while on medication  . Metronidazole Rash  . Penicillins Rash and Other (See Comments)    Has patient had a PCN reaction causing immediate rash, facial/tongue/throat swelling, SOB or lightheadedness  with hypotension: Unknown Has patient had a PCN reaction causing severe rash involving mucus membranes or skin necrosis: No Has patient had a PCN reaction that required hospitalization: Yes Has patient had a PCN reaction occurring within the last 10 years: No If all of the above answers are "NO", then may proceed with Cephalosporin use.     Current Medications:   Current Outpatient Medications:  .  calcium carbonate (TUMS - DOSED IN MG ELEMENTAL CALCIUM) 500 MG chewable tablet, Chew 1 tablet by mouth daily. , Disp: , Rfl:  .  Cholecalciferol (VITAMIN D3) 5000 units CAPS, Take 5,000 Units by mouth daily. , Disp: , Rfl:  .  famotidine (PEPCID) 20 MG tablet, Take 20 mg by mouth at bedtime., Disp: , Rfl:  .  Multiple Vitamins-Minerals (ICAPS AREDS 2) CAPS, Take 2 capsules by mouth daily. , Disp: , Rfl:  .  nadolol (CORGARD) 40 MG tablet, Take 0.5 tablets (20 mg total) by mouth daily., Disp: 903 tablet, Rfl: 3 .  Omega-3 Fatty Acids (FISH OIL) 1200 MG CAPS, Take 1,200 mg by mouth daily., Disp: , Rfl:  .  potassium chloride SA (K-DUR,KLOR-CON) 20 MEQ tablet, Take 1 tablet (20 mEq total) by mouth daily as needed. (Patient taking differently: Take 20 mEq by mouth daily. ), Disp: 30 tablet, Rfl: 3 .  warfarin (COUMADIN) 5 MG tablet, Take 1 tablet (5 mg total) by mouth one time only at 6 PM. Or as directed.. (Patient taking differently: Take 2.5-5 mg by mouth See admin instructions. Take 2.5 mg by mouth daily on Monday, Wednesday and Saturday. Take 5 mg by mouth daily on all other days.), Disp: 30 tablet, Rfl: 4   Review of Systems:   Review of Systems  HENT: Negative for trouble swallowing.   Eyes: Negative for redness and itching.  Respiratory: Negative for cough.    Cardiovascular: Negative for chest pain.  Gastrointestinal: Negative for diarrhea.  Skin: Positive for itching.    Vitals:   Vitals:   10/04/18 0808  BP: 138/76  Pulse: (!) 54  Temp: 99.2 F (37.3 C)  TempSrc: Oral  SpO2: 98%  Weight: 171 lb 4 oz (77.7 kg)  Height: 5' (1.524 m)     Body mass index is 33.44 kg/m.  Physical Exam:   Physical Exam  Constitutional: She appears well-developed. She is cooperative.  Non-toxic appearance. She does not have a sickly appearance. She does not appear ill. No distress.  Cardiovascular: Normal rate, regular rhythm, S1 normal, S2 normal, normal heart sounds and normal pulses.  No LE edema  Pulmonary/Chest: Effort normal and breath sounds normal.  Neurological: She is alert. GCS eye subscore is 4. GCS verbal subscore is 5. GCS motor subscore is 6.  Skin: Skin is warm, dry and intact.  Well defined erythematous plaques scattered on torso, back and inner thighs  Psychiatric: She has a normal mood and affect. Her speech is normal and behavior is normal.  Nursing note and vitals reviewed.   Assessment and Plan:    Jovee was seen today for allergic reaction.  Diagnoses and all orders for this visit:  Rash; Hypokalemia No red flags on exam. Vitals stable. Given onset and timing related to potassium supplement intake, suspect that rash is likely secondary to potassium supplement. Given that she has already discontinued her lasix, is still taking her potassium supplement, and her repeat potassium two weeks ago was normal, I wonder if she still needs potassium supplement at this point. Stop potassium supplement now and  re-check potassium today. Will make recommendations based on potassium results. Patient verbalized understanding to plan. Also discussed that if she needs to take it, benadryl is ok. -     Basic metabolic panel  . Reviewed expectations re: course of current medical issues. . Discussed self-management of symptoms. . Outlined  signs and symptoms indicating need for more acute intervention. . Patient verbalized understanding and all questions were answered. . See orders for this visit as documented in the electronic medical record. . Patient received an After-Visit Summary.  CMA or LPN served as scribe during this visit. History, Physical, and Plan performed by medical provider. The above documentation has been reviewed and is accurate and complete.  Inda Coke, PA-C

## 2018-10-04 NOTE — Patient Instructions (Signed)
It was great to see you!  Stop your potassium supplement. I will call you by tomorrow with the plans for what we are going to do.  Take care,  Inda Coke PA-C

## 2018-10-05 ENCOUNTER — Telehealth: Payer: Self-pay | Admitting: Family Medicine

## 2018-10-05 NOTE — Telephone Encounter (Signed)
Documented in result notes. 

## 2018-10-05 NOTE — Telephone Encounter (Signed)
Copied from [place] [id number]. Topic: Quick Communication - Lab Results (Clinic Use ONLY) >> Oct 05, 2018 10:10 AM Marian Sorrow, LPN wrote: Called patient to inform them of 10/04/2018 lab results. When patient returns call, triage nurse may disclose results. >> Oct 05, 2018  4:04 PM Bea Graff, NT wrote: Pt calling back to get her lab results. Please advise

## 2018-10-10 ENCOUNTER — Ambulatory Visit (INDEPENDENT_AMBULATORY_CARE_PROVIDER_SITE_OTHER): Payer: Medicare Other | Admitting: Pharmacist

## 2018-10-10 DIAGNOSIS — I4819 Other persistent atrial fibrillation: Secondary | ICD-10-CM | POA: Diagnosis not present

## 2018-10-10 DIAGNOSIS — Z7901 Long term (current) use of anticoagulants: Secondary | ICD-10-CM | POA: Diagnosis not present

## 2018-10-10 LAB — POCT INR: INR: 3.2 — AB (ref 2.0–3.0)

## 2018-10-18 ENCOUNTER — Other Ambulatory Visit: Payer: Self-pay

## 2018-10-18 ENCOUNTER — Other Ambulatory Visit (INDEPENDENT_AMBULATORY_CARE_PROVIDER_SITE_OTHER): Payer: Medicare Other

## 2018-10-18 ENCOUNTER — Ambulatory Visit (HOSPITAL_COMMUNITY)
Admission: RE | Admit: 2018-10-18 | Discharge: 2018-10-18 | Disposition: A | Discharge status: Home / Self Care | Payer: Medicare Other | Source: Ambulatory Visit | Attending: Nurse Practitioner | Admitting: Nurse Practitioner

## 2018-10-18 VITALS — BP 146/84 | HR 84 | Ht 60.0 in | Wt 174.0 lb

## 2018-10-18 DIAGNOSIS — Z881 Allergy status to other antibiotic agents status: Secondary | ICD-10-CM | POA: Diagnosis not present

## 2018-10-18 DIAGNOSIS — Z7901 Long term (current) use of anticoagulants: Secondary | ICD-10-CM | POA: Diagnosis not present

## 2018-10-18 DIAGNOSIS — G5600 Carpal tunnel syndrome, unspecified upper limb: Secondary | ICD-10-CM | POA: Insufficient documentation

## 2018-10-18 DIAGNOSIS — K449 Diaphragmatic hernia without obstruction or gangrene: Secondary | ICD-10-CM | POA: Diagnosis not present

## 2018-10-18 DIAGNOSIS — G629 Polyneuropathy, unspecified: Secondary | ICD-10-CM | POA: Insufficient documentation

## 2018-10-18 DIAGNOSIS — E876 Hypokalemia: Secondary | ICD-10-CM | POA: Diagnosis not present

## 2018-10-18 DIAGNOSIS — I4819 Other persistent atrial fibrillation: Secondary | ICD-10-CM | POA: Diagnosis not present

## 2018-10-18 DIAGNOSIS — Z880 Allergy status to penicillin: Secondary | ICD-10-CM | POA: Diagnosis not present

## 2018-10-18 DIAGNOSIS — G4733 Obstructive sleep apnea (adult) (pediatric): Secondary | ICD-10-CM | POA: Diagnosis not present

## 2018-10-18 DIAGNOSIS — Z79899 Other long term (current) drug therapy: Secondary | ICD-10-CM | POA: Insufficient documentation

## 2018-10-18 DIAGNOSIS — R0602 Shortness of breath: Secondary | ICD-10-CM | POA: Insufficient documentation

## 2018-10-18 DIAGNOSIS — I10 Essential (primary) hypertension: Secondary | ICD-10-CM | POA: Insufficient documentation

## 2018-10-18 DIAGNOSIS — R0989 Other specified symptoms and signs involving the circulatory and respiratory systems: Secondary | ICD-10-CM | POA: Diagnosis not present

## 2018-10-18 DIAGNOSIS — Z87891 Personal history of nicotine dependence: Secondary | ICD-10-CM | POA: Insufficient documentation

## 2018-10-18 DIAGNOSIS — J90 Pleural effusion, not elsewhere classified: Secondary | ICD-10-CM

## 2018-10-18 DIAGNOSIS — K219 Gastro-esophageal reflux disease without esophagitis: Secondary | ICD-10-CM | POA: Diagnosis not present

## 2018-10-18 DIAGNOSIS — G250 Essential tremor: Secondary | ICD-10-CM | POA: Insufficient documentation

## 2018-10-18 LAB — BASIC METABOLIC PANEL
BUN: 20 mg/dL (ref 6–23)
CO2: 26 meq/L (ref 19–32)
Calcium: 9.4 mg/dL (ref 8.4–10.5)
Chloride: 102 mEq/L (ref 96–112)
Creatinine, Ser: 0.84 mg/dL (ref 0.40–1.20)
GFR: 69.12 mL/min (ref >60.00)
GLUCOSE: 106 mg/dL — AB (ref 70–99)
POTASSIUM: 4.1 meq/L (ref 3.5–5.1)
SODIUM: 139 meq/L (ref 135–145)

## 2018-10-18 NOTE — Progress Notes (Signed)
Primary Care Physician: Marin Olp, MD Referring Physician:Dr. Joretta Eads is a 83 y.o. female with a h/o HTN, OSA, on CPAP,HTN,with new onset persistent afib around the first of August. She was placed on warfarin and was scheduled for cardioversion after adequate anticoagulation.She is in the afib clinic for f/u of successful cardioversion and is back in afib but was unaware. However, she feels more short of breath with exertion and thinks she has lung congestion.She was earlier started on low dose  lasix with subsequent low K+ and supplementation started. She developed a rash so both were stopped by PCP. However, pt feels she needs some type of diuretic. She is more short of breath with exertion but does not feel palpitations with afib.. She does have structural heart disease with severe dilated left atrium and severeTR.    Today, she denies symptoms of palpitations, chest pain,orthopnea, PND, lower extremity edema, dizziness, presyncope, syncope, or neurologic sequela.+ for shortness of breath. The patient is tolerating medications without difficulties .  Past Medical History:  Diagnosis Date  . Achilles tendon rupture   . Arthritis of hand, right   . Carpal tunnel syndrome 09/04/2013  . Collagenous colitis 2005  . Colon polyps 2010   Tubular adenoma and hyperplastic  . Dental infection 10/25/2014  . Diverticulosis   . Esophageal stenosis   . Essential tremor   . GERD (gastroesophageal reflux disease)    hx hiatal hernia, hx esophagitis, hx stricture  . Heart murmur   . Hiatal hernia   . Hx: UTI (urinary tract infection)   . Hypertension   . Lower extremity neuropathy 08/23/2013   On B12 therapy with numbness in the feet bilaterally no evidence of diabetes   . Ocular migraine    jagged vision, a few per month  . OSA (obstructive sleep apnea) 12/21/2016   on CPAP  . PAF (paroxysmal atrial fibrillation) ([place]) 06/2018   NEWLY DIAGNOSED 07/18/2018  . Peripheral  neuropathy    treated by Dr Posey Pronto (07/2015)   Past Surgical History:  Procedure Laterality Date  . 48 hr Holter Monitor  07/2018   Persistent Afib (rate 33 - 124 bpm)  . APPENDECTOMY    . CARDIOVERSION N/A 10/02/2018   Procedure: CARDIOVERSION;  Surgeon: Acie Fredrickson, Wonda Cheng, MD;  Location: [facility] ENDOSCOPY;  Service: Cardiovascular;  Laterality: N/A;  . CARPAL TUNNEL RELEASE    . CATARACT EXTRACTION    . EXCISION MORTON'S NEUROMA     Right foot  . INTRAOCULAR LENS IMPLANT, SECONDARY    . Moles removed    . MOUTH SURGERY     (For Exostosis)  . NUCLEAR  07/2018   EF 71 %. LOW RISK - no ischemia or Infarct.   Marland Kitchen ROTATOR CUFF REPAIR    . TONSILLECTOMY    . TRANSTHORACIC ECHOCARDIOGRAM  07/2018   Normal LV Fxn (EF 60-65%) no RWMA.  Severe LA dilation & Severe TR.    Current Outpatient Medications  Medication Sig Dispense Refill  . calcium carbonate (TUMS - DOSED IN MG ELEMENTAL CALCIUM) 500 MG chewable tablet Chew 1 tablet by mouth daily.     . Cholecalciferol (VITAMIN D3) 5000 units CAPS Take 5,000 Units by mouth daily.     . famotidine (PEPCID) 20 MG tablet Take 20 mg by mouth at bedtime.    . Multiple Vitamins-Minerals (ICAPS AREDS 2) CAPS Take 2 capsules by mouth daily.     . nadolol (CORGARD) 40 MG tablet Take 0.5 tablets (  20 mg total) by mouth daily. (Patient taking differently: Take 40 mg by mouth daily. ) 903 tablet 3  . Omega-3 Fatty Acids (FISH OIL) 1200 MG CAPS Take 1,200 mg by mouth daily.    Marland Kitchen warfarin (COUMADIN) 5 MG tablet Take 1 tablet (5 mg total) by mouth one time only at 6 PM. Or as directed.. (Patient taking differently: Take 2.5-5 mg by mouth See admin instructions. Take 2.5 mg by mouth daily on Monday, Wednesday and Saturday. Take 5 mg by mouth daily on all other days.) 30 tablet 4   No current facility-administered medications for this encounter.     Allergies  Allergen Reactions  . Clarithromycin Nausea Only  . Erythromycin Base Nausea And Vomiting  . Levofloxacin  Other (See Comments)    Patient has a history of a Achilles tendon tear and developed tendon pain while on medication  . Potassium Chloride Hives  . Metronidazole Rash  . Penicillins Rash and Other (See Comments)    Has patient had a PCN reaction causing immediate rash, facial/tongue/throat swelling, SOB or lightheadedness with hypotension: Unknown Has patient had a PCN reaction causing severe rash involving mucus membranes or skin necrosis: No Has patient had a PCN reaction that required hospitalization: Yes Has patient had a PCN reaction occurring within the last 10 years: No If all of the above answers are "NO", then may proceed with Cephalosporin use.     Social History   Socioeconomic History  . Marital status: Married    Spouse name: Not on file  . Number of children: 2  . Years of education: Not on file  . Highest education level: Not on file  Occupational History  . Occupation: Retired  Scientific laboratory technician  . Financial resource strain: Not on file  . Food insecurity:    Worry: Not on file    Inability: Not on file  . Transportation needs:    Medical: Not on file    Non-medical: Not on file  Tobacco Use  . Smoking status: Former Smoker    Packs/day: 2.00    Years: 13.00    Pack years: 26.00    Types: Cigarettes    Start date: 12/26/1954    Last attempt to quit: 03/26/1968    Years since quitting: 50.5  . Smokeless tobacco: Never Used  . Tobacco comment: Quit in 1969 - smokeed 2-3PPD , was 3 PPD at her heaviest for about 3 years.   Substance and Sexual Activity  . Alcohol use: Yes    Alcohol/week: 1.0 standard drinks    Types: 1 Glasses of wine per week    Comment: 1 glass of wine per day  . Drug use: No  . Sexual activity: Yes  Lifestyle  . Physical activity:    Days per week: Not on file    Minutes per session: Not on file  . Stress: Not on file  Relationships  . Social connections:    Talks on phone: Not on file    Gets together: Not on file    Attends  religious service: Not on file    Active member of club or organization: Not on file    Attends meetings of clubs or organizations: Not on file    Relationship status: Not on file  . Intimate partner violence:    Fear of current or ex partner: Not on file    Emotionally abused: Not on file    Physically abused: Not on file    Forced sexual activity:  Not on file  Other Topics Concern  . Not on file  Social History Narrative   Married. Husband is patient of Dr. Yong Channel.    Married 1958. 2 children. 4 grandkids (3 girls, 1 boy).    Retired from [employer] and [employer].   Hobbies: genealogy and DNA    Family History  Problem Relation Age of Onset  . Rectal cancer Father        colostomy  . Heart failure Father        Died, 89  . Other Mother        Died, 68. old age.   . Barrett's esophagus Son   . Colon cancer Neg Hx   . Esophageal cancer Neg Hx   . Stomach cancer Neg Hx     ROS- All systems are reviewed and negative except as per the HPI above  Physical Exam: Vitals:   10/18/18 1421  BP: (!) 146/84  Pulse: 84  Weight: 78.9 kg  Height: 5' (1.524 m)   Wt Readings from Last 3 Encounters:  10/18/18 78.9 kg  10/04/18 77.7 kg  09/26/18 77.6 kg    Labs: Lab Results  Component Value Date   NA 139 10/18/2018   K 4.1 10/18/2018   CL 102 10/18/2018   CO2 26 10/18/2018   GLUCOSE 106 (H) 10/18/2018   BUN 20 10/18/2018   CREATININE 0.84 10/18/2018   CALCIUM 9.4 10/18/2018   PHOS 5.4 (H) 02/08/2010   Lab Results  Component Value Date   INR 3.2 (A) 10/10/2018   Lab Results  Component Value Date   CHOL 191 09/18/2018   HDL 53.70 09/18/2018   LDLCALC 116 (H) 09/18/2018   TRIG 106.0 09/18/2018     GEN- The patient is well appearing, alert and oriented x 3 today.   Head- normocephalic, atraumatic Eyes-  Sclera clear, conjunctiva pink Ears- hearing intact Oropharynx- clear Neck- supple, no JVP Lymph- no cervical lymphadenopathy Lungs- base  crackles , normal work of breathing Heart- irregular rate and rhythm, no murmurs, rubs or gallops, PMI not laterally displaced GI- soft, NT, ND, + BS Extremities- no clubbing, cyanosis, or edema MS- no significant deformity or atrophy Skin- no rash or lesion Psych- euthymic mood, full affect Neuro- strength and sensation are intact  EKG- atrial flutter at 84 bpm, qrs int 92 ms, qtc int 484 ms Echo-Study Conclusions  - Left ventricle: The cavity size was normal. Systolic function was   normal. The estimated ejection fraction was in the range of 60%   to 65%. Wall motion was normal; there were no regional wall   motion abnormalities. - Aortic valve: Transvalvular velocity was within the normal range.   There was no stenosis. There was no regurgitation. - Mitral valve: Transvalvular velocity was within the normal range.   There was no evidence for stenosis. There was mild regurgitation. - Left atrium: The atrium was severely dilated. - Right ventricle: The cavity size was normal. Wall thickness was   normal. Systolic function was normal. - Right atrium: The atrium was mildly dilated. - Atrial septum: No defect or patent foramen ovale was identified   by color flow Doppler. - Tricuspid valve: There was severe regurgitation. - Pulmonary arteries: Systolic pressure was within the normal   range. PA peak pressure: 30 mm Hg (S).   Assessment and Plan: 1. Persistent symptomatic afib with structural heart disease Discussion with pt restoring SR CXR done today shows small bilateral effusions and chronic interstitial  changes Therefore, I would be hesitate to start amiodarone Baseline EKG in SR shows first degree AV block which would  be best to avoid 1c agents I am very concerned re use of tikosyn/sotalol  as it is critical to keep good levels of K+(over 4) and not being able to use K+ supplementation, as pt states that she is allergic. Her qtc at baseline is in the 460-480 ms range,on long  side for tikosyn use. She is not a candidate for ablation with severely dilated left atrium. I discussed the above with Dr. Rayann Heman and unfortunately he feels her goal going forward is rate control.She is rate controlled today at 84 bpm She may be a tricuspid valve repair with Maze but that will be up to  Dr. [name]   2. Exertional shortness of breath Pt feels that she needs some  type of diuretic Her weight Is up a couple of pounds   I discussed with Racquel, PharmD at [facility], as pt states that she has been working with her with her various meds She thought the best diuretic/K+ sparing may be chlorthalidone 25 mg qd She will see the pt on f/u 10/30 in coumadin clinic  and will order bmet then   3. Chadsvasc score of at least 4 F/u with coumadin clinic NL   Stefhanie Kachmar C. Sara Keys, [facility] [address] [place], [place] [address] [phone]

## 2018-10-19 ENCOUNTER — Other Ambulatory Visit (HOSPITAL_COMMUNITY): Payer: Self-pay | Admitting: *Deleted

## 2018-10-19 MED ORDER — CHLORTHALIDONE 25 MG PO TABS: 25.0000 mg | ORAL_TABLET | Freq: Every day | ORAL | 6 refills | Status: DC

## 2018-10-20 NOTE — Progress Notes (Signed)
Depends on how symptomatic she is.  Not really sure what else we can offer her.  Glenetta Hew, MD

## 2018-10-22 ENCOUNTER — Ambulatory Visit (INDEPENDENT_AMBULATORY_CARE_PROVIDER_SITE_OTHER): Payer: Medicare Other

## 2018-10-22 DIAGNOSIS — Z23 Encounter for immunization: Secondary | ICD-10-CM | POA: Diagnosis not present

## 2018-10-24 ENCOUNTER — Ambulatory Visit (INDEPENDENT_AMBULATORY_CARE_PROVIDER_SITE_OTHER): Payer: Medicare Other | Admitting: Pharmacist Clinician (PhC)/ Clinical Pharmacy Specialist

## 2018-10-24 DIAGNOSIS — Z7901 Long term (current) use of anticoagulants: Secondary | ICD-10-CM

## 2018-10-24 DIAGNOSIS — I4819 Other persistent atrial fibrillation: Secondary | ICD-10-CM

## 2018-10-24 LAB — POCT INR: INR: 1.9 — AB (ref 2.0–3.0)

## 2018-10-24 NOTE — Patient Instructions (Signed)
Description   Continue 1/2 tablet daily except for 1 tablet each Tuesday and Thursday.  repeat INR in 2 week

## 2018-10-29 ENCOUNTER — Telehealth: Payer: Self-pay | Admitting: Family Medicine

## 2018-10-29 NOTE — Telephone Encounter (Signed)
Copied from [place] [phone]. Topic: General - Other >> Oct 29, 2018 10:16 AM Antonieta Iba C wrote: Reason for CRM: pt called in to speak with Roselyn Reef, she said that she has been trying to reach her for a few day. Pt would like to make Roselyn Reef / PCP aware that she has been taking a diuretic since 10/20/18. Pt says that she received some results.   Please call pt back: [phone]

## 2018-10-30 NOTE — Telephone Encounter (Signed)
Called and spoke with patient who states that Cardiology put her back on a diuretic and stated that they are drawing labs to check her levels on the 12th of this month. Patient wasn't sure if we were supposed to draw the labs or Cardiology. I advised her that Cardiology would draw. She verbalized understanding

## 2018-11-06 ENCOUNTER — Ambulatory Visit (INDEPENDENT_AMBULATORY_CARE_PROVIDER_SITE_OTHER): Payer: Medicare Other | Admitting: Pharmacist

## 2018-11-06 DIAGNOSIS — Z7901 Long term (current) use of anticoagulants: Secondary | ICD-10-CM | POA: Diagnosis not present

## 2018-11-06 DIAGNOSIS — I4819 Other persistent atrial fibrillation: Secondary | ICD-10-CM | POA: Diagnosis not present

## 2018-11-06 LAB — POCT INR: INR: 1.6 — AB (ref 2.0–3.0)

## 2018-11-07 LAB — BASIC METABOLIC PANEL
BUN/Creatinine Ratio: 22 (ref 12–28)
BUN: 19 mg/dL (ref 8–27)
CO2: 26 mmol/L (ref 20–29)
Calcium: 9.5 mg/dL (ref 8.7–10.3)
Chloride: 94 mmol/L — ABNORMAL LOW (ref 96–106)
Creatinine, Ser: 0.85 mg/dL (ref 0.57–1.00)
GFR calc Af Amer: 74 mL/min/{1.73_m2} (ref >59)
GFR calc non Af Amer: 64 mL/min/{1.73_m2} (ref >59)
GLUCOSE: 111 mg/dL — AB (ref 65–99)
POTASSIUM: 3.8 mmol/L (ref 3.5–5.2)
SODIUM: 136 mmol/L (ref 134–144)

## 2018-11-09 ENCOUNTER — Telehealth: Payer: Self-pay | Admitting: *Deleted

## 2018-11-09 NOTE — Telephone Encounter (Signed)
-----   Message from Leonie Man, MD sent at 11/09/2018 11:21 AM EST ----- Stable chemistry panel.  Potassium drifting up a little bit at 3.8.  May need to increase supplementation (trying to be more bananas dose)  Glenetta Hew, MD

## 2018-11-09 NOTE — Telephone Encounter (Signed)
Left detailed message on pt's voicemail per DPI Also result released in my chart  Any question can call back

## 2018-11-20 ENCOUNTER — Ambulatory Visit (INDEPENDENT_AMBULATORY_CARE_PROVIDER_SITE_OTHER): Payer: Medicare Other | Admitting: Pharmacist Clinician (PhC)/ Clinical Pharmacy Specialist

## 2018-11-20 DIAGNOSIS — Z7901 Long term (current) use of anticoagulants: Secondary | ICD-10-CM

## 2018-11-20 DIAGNOSIS — I4819 Other persistent atrial fibrillation: Secondary | ICD-10-CM

## 2018-11-20 LAB — POCT INR: INR: 2.5 (ref 2.0–3.0)

## 2018-11-27 ENCOUNTER — Ambulatory Visit (INDEPENDENT_AMBULATORY_CARE_PROVIDER_SITE_OTHER): Payer: Medicare Other | Admitting: Cardiology

## 2018-11-27 ENCOUNTER — Encounter: Payer: Self-pay | Admitting: Cardiology

## 2018-11-27 VITALS — BP 126/74 | HR 73 | Ht 65.0 in | Wt 168.6 lb

## 2018-11-27 DIAGNOSIS — R0609 Other forms of dyspnea: Secondary | ICD-10-CM

## 2018-11-27 DIAGNOSIS — I482 Chronic atrial fibrillation, unspecified: Secondary | ICD-10-CM

## 2018-11-27 DIAGNOSIS — I10 Essential (primary) hypertension: Secondary | ICD-10-CM | POA: Diagnosis not present

## 2018-11-27 DIAGNOSIS — I071 Rheumatic tricuspid insufficiency: Secondary | ICD-10-CM

## 2018-11-27 NOTE — Assessment & Plan Note (Signed)
She has severe biatrial enlargement and notable tricuspid regurgitation on transthoracic echo.  Will discuss with imaging colleagues as well as CT surgery if there is any benefit for TEE. TEE will be very difficult because of her history of esophageal stricture.  Would need to use a pediatric scope.  Question is how much this is contributing to her dyspnea.  She does not have PND orthopnea to suspect that this is coming from the left heart.  There is also no significant edema or ascites which would argue for occult cor pulmonale.  This would lead to the question of it being functional tricuspid regurgitation from extreme atrial dilation related to A. fib. We will need to review possible benefit of tricuspid valve repair/ring annuloplasty along with Maze procedure.  Will consult CT surgery for their opinion and stand.  To do any further evaluation such as TEE or cardiac catheterization.

## 2018-11-27 NOTE — Assessment & Plan Note (Signed)
Short-term improvement with cardioversion, but unable to maintain sinus rhythm.  Is now back in sinus A. fib with a controlled rate.  Per electrophysiology, the best option would be rate control with beta-blocker. The issue is her profound dyspnea.  (There was no discussion about possible AV node ablation and pacemaker)  Recommendation was to consider Maze procedure with tricuspid valve repair. -->  Will review this option with CT surgery and schedule with valve clinic in January timeframe.  This would allow [place] to do any additional procedures such as cardiac catheterization or TEE if necessary per my discussion.

## 2018-11-27 NOTE — Progress Notes (Signed)
PCP: Marin Olp, MD  Clinic Note: Chief Complaint  Patient presents with  . Follow-up    Still notes profound exertional dyspnea  . Atrial Fibrillation  . Cardiac Valve Problem    Tricuspid regurgitation    HPI: Adriana Hendricks is a 83 y.o. female with a PMH below who presents today for general cardiology follow-up for persistent atrial fibrillation as well as profound exertional dyspnea and concern for tricuspid regurgitation. -->  Basically worsening exertional dyspnea over the last year.  Was always told that she had some type of valvular heart disease.  I last saw Adriana Hendricks on September 3 for follow-up after her initial visit on August 1 where she was evaluated with nuclear stress test and echocardiogram as well as Holter monitor (results in [facility]).  She decided to start warfarin at that time and continued her nadolol for rate control. -->  She was referred to the A. fib clinic and has been followed there until now.  Adriana Hendricks was last seen on October 18, 2018 by [name], NP in the A. fib clinic (along with Dr. Thompson Grayer) --> she had been scheduled for cardioversion after anticoagulation, but was back in A. fib on follow-up evaluation.  Continues to note extreme shortness of breath with exertion feeling that she had "lung congestion".  Had been started on low-dose Lasix that has been stopped.  There was concern for echocardiogram showing severe dilated left atrium and right atrium as well as severe TR. -->  Per discussion with Dr. Thompson Grayer, felt that her best option going forward would be rate control as there was concern for using amiodarone with some baseline lung disease, and there was concerned about using other antiarrhythmics because of borderline QT interval and hypokalemia.  Questioned possibility of tricuspid valve repair and MAZE.  Recent Hospitalizations:   Short-term successful cardioversion on October 02, 2018  Studies Personally Reviewed - (if  available, images/films reviewed: From Epic Chart or Care Everywhere)  No new studies  Interval History: Adriana Hendricks returns today for the most part just noticing no real change with her just complete lack of mobility to get up and go and do things.  She has profound exertional dyspnea with just but any type of activity.  She is limited to walking around the house.  She cannot hardly do anything else without getting short of breath --> anytime she tries to do anything with any kind of rush or to push herself she gets short of breath.  Can only just gradually walk around.  Cannot do stairs without stopping.Marland Kitchen  She was very briefly in sinus rhythm and noted some improvement in symptoms, but has not been on to maintain sinus rhythm.  She has not, nor has she ever had any exertional chest pain or pressure. She does not have any orthopnea or PND.  She does not have any edema or increased abdominal girth to suggest right-sided heart failure.  She denies any sensation of irregular heartbeats or palpitations.  She has not had any lightheadedness or dizziness.  No syncope or near syncope.  No TIA or amaurosis fugax. No bleeding on warfarin: No melena, hematochezia, hematuria or epistaxis. No claudication.  ROS: A comprehensive was performed. Review of Systems  Constitutional: Positive for malaise/fatigue. Negative for weight loss.  HENT: Negative for congestion and sore throat.   Respiratory: Positive for shortness of breath.   Cardiovascular:       Per HPI  Gastrointestinal: Negative for  heartburn.  Genitourinary: Negative for dysuria.  Musculoskeletal: Negative for back pain and falls.  Neurological: Negative for dizziness, focal weakness and weakness.  Endo/Heme/Allergies: Does not bruise/bleed easily.  Psychiatric/Behavioral: The patient is nervous/anxious.     I have reviewed and (if needed) personally updated the patient's problem list, medications, allergies, past medical and surgical history,  social and family history.   Past Medical History:  Diagnosis Date  . Achilles tendon rupture   . Arthritis of hand, right   . Carpal tunnel syndrome 09/04/2013  . Collagenous colitis 2005  . Colon polyps 2010   Tubular adenoma and hyperplastic  . Dental infection 10/25/2014  . Diverticulosis   . Esophageal stenosis   . Essential tremor   . GERD (gastroesophageal reflux disease)    hx hiatal hernia, hx esophagitis, hx stricture  . Heart murmur   . Hiatal hernia   . Hx: UTI (urinary tract infection)   . Hypertension   . Lower extremity neuropathy 08/23/2013   On B12 therapy with numbness in the feet bilaterally no evidence of diabetes   . Ocular migraine    jagged vision, a few per month  . OSA (obstructive sleep apnea) 12/21/2016   on CPAP  . PAF (paroxysmal atrial fibrillation) ([place]) 06/2018   NEWLY DIAGNOSED 07/18/2018  . Peripheral neuropathy    treated by Dr Posey Pronto (07/2015)    Past Surgical History:  Procedure Laterality Date  . 48 hr Holter Monitor  07/2018   Persistent Afib (rate 33 - 124 bpm)  . APPENDECTOMY    . CARDIOVERSION N/A 10/02/2018   Procedure: CARDIOVERSION;  Surgeon: Acie Fredrickson, Wonda Cheng, MD;  Location: [facility] ENDOSCOPY;  Service: Cardiovascular;  Laterality: N/A;  . CARPAL TUNNEL RELEASE    . CATARACT EXTRACTION    . EXCISION MORTON'S NEUROMA     Right foot  . INTRAOCULAR LENS IMPLANT, SECONDARY    . Moles removed    . MOUTH SURGERY     (For Exostosis)  . NUCLEAR  07/2018   EF 71 %. LOW RISK - no ischemia or Infarct.   Marland Kitchen ROTATOR CUFF REPAIR    . TONSILLECTOMY    . TRANSTHORACIC ECHOCARDIOGRAM  07/2018   Normal LV Fxn (EF 60-65%) no RWMA.  Severe LA dilation & Severe TR.    No outpatient medications have been marked as taking for the 11/27/18 encounter (Office Visit) with Leonie Man, MD.    Allergies  Allergen Reactions  . Clarithromycin Nausea Only  . Erythromycin Base Nausea And Vomiting  . Levofloxacin Other (See Comments)    Patient has a  history of a Achilles tendon tear and developed tendon pain while on medication  . Potassium Chloride Hives  . Metronidazole Rash  . Penicillins Rash and Other (See Comments)    Has patient had a PCN reaction causing immediate rash, facial/tongue/throat swelling, SOB or lightheadedness with hypotension: Unknown Has patient had a PCN reaction causing severe rash involving mucus membranes or skin necrosis: No Has patient had a PCN reaction that required hospitalization: Yes Has patient had a PCN reaction occurring within the last 10 years: No If all of the above answers are "NO", then may proceed with Cephalosporin use.     Social History   Tobacco Use  . Smoking status: Former Smoker    Packs/day: 2.00    Years: 13.00    Pack years: 26.00    Types: Cigarettes    Start date: 12/26/1954    Last attempt to quit: 03/26/1968  Years since quitting: 50.7  . Smokeless tobacco: Never Used  . Tobacco comment: Quit in 1969 - smokeed 2-3PPD , was 3 PPD at her heaviest for about 3 years.   Substance Use Topics  . Alcohol use: Yes    Alcohol/week: 1.0 standard drinks    Types: 1 Glasses of wine per week    Comment: 1 glass of wine per day  . Drug use: No   Social History   Social History Narrative   Married. Husband is patient of Dr. Yong Channel.    Married 1958. 2 children. 4 grandkids (3 girls, 1 boy).    Retired from [employer].   Hobbies: genealogy and DNA    family history includes Barrett's esophagus in her son; Heart failure in her father; Other in her mother; Rectal cancer in her father.  Wt Readings from Last 3 Encounters:  11/27/18 168 lb 9.6 oz (76.5 kg)  10/18/18 174 lb (78.9 kg)  10/04/18 171 lb 4 oz (77.7 kg)    PHYSICAL EXAM BP 126/74   Pulse 73   Ht 5\' 5"  (1.651 m)   Wt 168 lb 9.6 oz (76.5 kg)   LMP  (LMP Unknown)   BMI 28.06 kg/m  Physical Exam  Constitutional: She is oriented to person, place, and time. She appears well-developed  and well-nourished. No distress.  Healthy-appearing.  Well-groomed.  HENT:  Head: Normocephalic and atraumatic.  Neck: Normal range of motion. Neck supple. No hepatojugular reflux and no JVD (Notably, no cannon A waves.) present.  Cardiovascular: Normal rate, intact distal pulses and normal pulses. An irregularly irregular rhythm present. PMI is not displaced. Exam reveals no gallop and no friction rub.  Murmur (May be soft HSM, left lower sternal border, but very difficult to hear.) heard. Pulmonary/Chest: Effort normal and breath sounds normal. No respiratory distress. She has no wheezes. She has no rales. She exhibits no tenderness.  Abdominal: Soft. Bowel sounds are normal. She exhibits no distension. There is no tenderness. There is no rebound.  No HSM  Musculoskeletal: Normal range of motion. She exhibits no edema.  Neurological: She is alert and oriented to person, place, and time.  Psychiatric: She has a normal mood and affect. Her behavior is normal. Judgment and thought content normal.  Vitals reviewed.    Adult ECG Report  Rate: 73 ;  Rhythm: atrial fibrillation; incomplete right bundle branch block with borderline ST-T wave changes.  Narrative Interpretation: Stable EKG   Other studies Reviewed: Additional studies/ records that were reviewed today include:  Recent Labs:     Lab Results  Component Value Date   CREATININE 0.85 11/06/2018   BUN 19 11/06/2018   NA 136 11/06/2018   K 3.8 11/06/2018   CL 94 (L) 11/06/2018   CO2 26 11/06/2018     ASSESSMENT / PLAN: Problem List Items Addressed This Visit    Chronic atrial fibrillation (Chronic)    Short-term improvement with cardioversion, but unable to maintain sinus rhythm.  Is now back in sinus A. fib with a controlled rate.  Per electrophysiology, the best option would be rate control with beta-blocker. The issue is her profound dyspnea.  (There was no discussion about possible AV node ablation and  pacemaker)  Recommendation was to consider Maze procedure with tricuspid valve repair. -->  Will review this option with CT surgery and schedule with valve clinic in January timeframe.  This would allow [place] to do any additional procedures such as cardiac catheterization  or TEE if necessary per my discussion.      Relevant Orders   EKG 12-Lead   DOE (dyspnea on exertion) (Chronic)    Very difficult to say that this is a true cardiac issue.  Her A. fib rate is controlled and otherwise asymptomatic.  The only gross abnormalities noted on echo were significant biatrial enlargement and tricuspid regurgitation.  Otherwise it just does not connect dots as far as the tricuspid regurgitation leading to dyspnea besides this being evidence of pulmonary hypertension which would then lead to TR versus functional TR from atrial enlargement.  --> Still need to exclude pulmonary etiology and will defer that to her PCP.  I do not know that she would benefit from a diuretic.  We have tried to add Lasix, but she is had issues with hypokalemia.  She is now on chlorthalidone and her electrolytes seem to be okay.      Essential hypertension (Chronic)   Severe tricuspid regurgitation by prior echocardiogram - Primary (Chronic)    She has severe biatrial enlargement and notable tricuspid regurgitation on transthoracic echo.  Will discuss with imaging colleagues as well as CT surgery if there is any benefit for TEE. TEE will be very difficult because of her history of esophageal stricture.  Would need to use a pediatric scope.  Question is how much this is contributing to her dyspnea.  She does not have PND orthopnea to suspect that this is coming from the left heart.  There is also no significant edema or ascites which would argue for occult cor pulmonale.  This would lead to the question of it being functional tricuspid regurgitation from extreme atrial dilation related to A. fib. We will need to review possible  benefit of tricuspid valve repair/ring annuloplasty along with Maze procedure.  Will consult CT surgery for their opinion and stand.  To do any further evaluation such as TEE or cardiac catheterization.      Relevant Orders   Ambulatory referral to Cardiothoracic Surgery     I spent a total of 30 minutes with the patient and chart review. >  50% of the time was spent in direct patient consultation.    Current medicines are reviewed at length with the patient today.  (+/- concerns) none The following changes have been made:  none  Patient Instructions  Medication Instructions:   not needed If you need a refill on your cardiac medications before your next appointment, please call your pharmacy.   Lab work: NOT NEEDED If you have labs (blood work) drawn today and your tests are completely normal, you will receive your results only by: Marland Kitchen MyChart Message (if you have MyChart) OR . A paper copy in the mail If you have any lab test that is abnormal or we need to change your treatment, we will call you to review the results.  Testing/Procedures: NOT NEEDED  Follow-Up: At [facility], you and your health needs are our priority.  As part of our continuing mission to provide you with exceptional heart care, we have created designated Provider Care Teams.  These Care Teams include your primary Cardiologist (physician) and Advanced Practice Providers (APPs -  Physician Assistants and Nurse Practitioners) who all work together to provide you with the care you need, when you need it. You will need a follow up appointment in 2 months.  Please call our office 2 months in advance to schedule this appointment.  You may see Glenetta Hew, MD   or one  of the following Advanced Practice Providers on your designated Care Team:   Rosaria Ferries, PA-C . Jory Sims, DNP, ANP .   You have been referred to  Dr Ricard Dillon in jan 2020 - discuss  Possible tricuspid valve surgery      Any Other Special  Instructions Will Be Listed Below (If Applicable).    Studies Ordered:   Orders Placed This Encounter  Procedures  . Ambulatory referral to Cardiothoracic Surgery  . EKG 12-Lead      Glenetta Hew, M.D., M.S. Interventional Cardiologist   Pager # [phone] Phone # [phone] [address] [place], [place] [address]   Thank you for choosing Heartcare at [facility]!!

## 2018-11-27 NOTE — Patient Instructions (Signed)
Medication Instructions:   not needed If you need a refill on your cardiac medications before your next appointment, please call your pharmacy.   Lab work: NOT NEEDED If you have labs (blood work) drawn today and your tests are completely normal, you will receive your results only by: Marland Kitchen MyChart Message (if you have MyChart) OR . A paper copy in the mail If you have any lab test that is abnormal or we need to change your treatment, we will call you to review the results.  Testing/Procedures: NOT NEEDED  Follow-Up: At [facility], you and your health needs are our priority.  As part of our continuing mission to provide you with exceptional heart care, we have created designated Provider Care Teams.  These Care Teams include your primary Cardiologist (physician) and Advanced Practice Providers (APPs -  Physician Assistants and Nurse Practitioners) who all work together to provide you with the care you need, when you need it. You will need a follow up appointment in 2 months.  Please call our office 2 months in advance to schedule this appointment.  You may see Glenetta Hew, MD   or one of the following Advanced Practice Providers on your designated Care Team:   Rosaria Ferries, PA-C . Jory Sims, DNP, ANP .   You have been referred to  Dr Ricard Dillon in jan 2020 - discuss  Possible tricuspid valve surgery      Any Other Special Instructions Will Be Listed Below (If Applicable).

## 2018-11-27 NOTE — Assessment & Plan Note (Addendum)
Very difficult to say that this is a true cardiac issue.  Her A. fib rate is controlled and otherwise asymptomatic.  The only gross abnormalities noted on echo were significant biatrial enlargement and tricuspid regurgitation.  Otherwise it just does not connect dots as far as the tricuspid regurgitation leading to dyspnea besides this being evidence of pulmonary hypertension which would then lead to TR versus functional TR from atrial enlargement.  --> Still need to exclude pulmonary etiology and will defer that to her PCP.  I do not know that she would benefit from a diuretic.  We have tried to add Lasix, but she is had issues with hypokalemia.  She is now on chlorthalidone and her electrolytes seem to be okay.

## 2018-12-07 ENCOUNTER — Ambulatory Visit (INDEPENDENT_AMBULATORY_CARE_PROVIDER_SITE_OTHER): Payer: Medicare Other | Admitting: Pharmacist

## 2018-12-07 DIAGNOSIS — Z7901 Long term (current) use of anticoagulants: Secondary | ICD-10-CM | POA: Diagnosis not present

## 2018-12-07 DIAGNOSIS — I4819 Other persistent atrial fibrillation: Secondary | ICD-10-CM | POA: Diagnosis not present

## 2018-12-07 LAB — POCT INR: INR: 2.5 (ref 2.0–3.0)

## 2018-12-28 ENCOUNTER — Ambulatory Visit (INDEPENDENT_AMBULATORY_CARE_PROVIDER_SITE_OTHER): Payer: Medicare Other | Admitting: Pharmacist Clinician (PhC)/ Clinical Pharmacy Specialist

## 2018-12-28 DIAGNOSIS — I4891 Unspecified atrial fibrillation: Secondary | ICD-10-CM | POA: Diagnosis not present

## 2018-12-28 DIAGNOSIS — Z7901 Long term (current) use of anticoagulants: Secondary | ICD-10-CM

## 2018-12-28 DIAGNOSIS — I4819 Other persistent atrial fibrillation: Secondary | ICD-10-CM | POA: Diagnosis not present

## 2018-12-28 LAB — POCT INR: INR: 3.3 — AB (ref 2.0–3.0)

## 2018-12-28 NOTE — Patient Instructions (Signed)
Description   No warfarin today Friday Jan 3, then continue with 1/2 tablet daily except for 1 tablet each Tuesday, Thursday and Saturday.  repeat INR in 5 week

## 2018-12-31 ENCOUNTER — Encounter: Payer: Medicare Other | Admitting: Thoracic Surgery (Cardiothoracic Vascular Surgery)

## 2018-12-31 DIAGNOSIS — Z961 Presence of intraocular lens: Secondary | ICD-10-CM | POA: Diagnosis not present

## 2018-12-31 DIAGNOSIS — H52203 Unspecified astigmatism, bilateral: Secondary | ICD-10-CM | POA: Diagnosis not present

## 2018-12-31 DIAGNOSIS — H43813 Vitreous degeneration, bilateral: Secondary | ICD-10-CM | POA: Diagnosis not present

## 2018-12-31 DIAGNOSIS — H353132 Nonexudative age-related macular degeneration, bilateral, intermediate dry stage: Secondary | ICD-10-CM | POA: Diagnosis not present

## 2019-01-07 ENCOUNTER — Encounter: Payer: Self-pay | Admitting: Thoracic Surgery (Cardiothoracic Vascular Surgery)

## 2019-01-07 ENCOUNTER — Other Ambulatory Visit: Payer: Self-pay | Admitting: *Deleted

## 2019-01-07 ENCOUNTER — Institutional Professional Consult (permissible substitution) (INDEPENDENT_AMBULATORY_CARE_PROVIDER_SITE_OTHER): Payer: Medicare Other | Admitting: Thoracic Surgery (Cardiothoracic Vascular Surgery)

## 2019-01-07 ENCOUNTER — Other Ambulatory Visit: Payer: Self-pay

## 2019-01-07 VITALS — BP 134/70 | HR 84 | Resp 16 | Ht 65.5 in | Wt 167.0 lb

## 2019-01-07 DIAGNOSIS — I4819 Other persistent atrial fibrillation: Secondary | ICD-10-CM | POA: Diagnosis not present

## 2019-01-07 DIAGNOSIS — Z7901 Long term (current) use of anticoagulants: Secondary | ICD-10-CM | POA: Diagnosis not present

## 2019-01-07 DIAGNOSIS — K222 Esophageal obstruction: Secondary | ICD-10-CM

## 2019-01-07 DIAGNOSIS — I071 Rheumatic tricuspid insufficiency: Secondary | ICD-10-CM | POA: Diagnosis not present

## 2019-01-07 NOTE — Patient Instructions (Signed)
Continue all previous medications without any changes at this time  

## 2019-01-07 NOTE — Progress Notes (Signed)
[address]       [place] [address]             [place] REPORT  Referring Provider is Glenetta Hew, MD PCP is [name], MD  Chief Complaint  Patient presents with  . Tricuspid Regurgitation    Surgical eval, CARDIOVERSION 10/02/18, ECHO 08/02/18,   . Atrial Fibrillation    HPI:  Patient is an 84 year old female with history of hypertension and obstructive sleep apnea on home CPAP who has been referred for surgical consultation to discuss treatment options for management of persistent atrial fibrillation that has failed an attempt at DC cardioversion and severe tricuspid regurgitation.  Patient states that she was told she had a leaky mitral valve many years ago.  She otherwise reports no significant cardiac history.  She describes a several year history of gradual progression of symptoms of exertional shortness of breath.  She was diagnosed with obstructive sleep apnea and has been followed for several years by Dr. Halford Chessman.  She was seen in follow-up last summer and noted to be in atrial fibrillation.  She was referred for cardiac evaluation and has been followed carefully ever since by Dr. [name].  Baseline transthoracic echocardiogram performed August 02, 2018 revealed normal left ventricular size and systolic function with ejection fraction estimated 60 to 65%.  There was mild mitral regurgitation.  There was severe left atrial enlargement.  There was severe tricuspid regurgitation.  Right ventricular size and function was felt to be normal.  A nuclear stress test was performed and felt to be low risk.  Baseline ejection fraction was estimated 71%.  There were no ST segment changes during stress.   A 48-hour Holter monitor was performed revealing persistent atrial fibrillation.  The patient had no evidence for any episodes of sinus rhythm.  There were occasional PVCs.  The patient was anticoagulated using warfarin.   She underwent elective DC cardioversion December 02, 2018.  She initially was converted into sinus rhythm but she promptly returned to atrial fibrillation.  She was seen in follow-up in the atrial fibrillation clinic and felt to be relatively poor candidate for catheter-based ablation due to her age, the severity of left atrial enlargement, and the presence of severe tricuspid regurgitation.  She was seen in follow-up recently by Dr. [name] and has now been referred for elective surgical consultation.  Patient is married and lives locally in [place] with her husband.  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  She has never exercised on a regular basis.  She describes a long history of exertional shortness of breath that probably began at least 8 or 10 years ago.  Over the last 6 months or so symptoms have progressed.  She now gets short of breath quite easily, such as just walking 6 or 8 steps at a brisk pace.  She denies any resting shortness of breath.  She denies any history of PND, orthopnea, or lower extremity edema.  She has occasional palpitations and dizzy spells.  She has never had any syncopal episodes.  She denies any history of chest pain or chest tightness either with activity or at rest.  Past Medical History:  Diagnosis Date  . Achilles tendon rupture   . Arthritis of hand, right   . Carpal tunnel syndrome 09/04/2013  . Collagenous colitis 2005  .  Colon polyps 2010   Tubular adenoma and hyperplastic  . Dental infection 10/25/2014  . Diverticulosis   . Esophageal stenosis   . Essential tremor   . GERD (gastroesophageal reflux disease)    hx hiatal hernia, hx esophagitis, hx stricture  . Heart murmur   . Hiatal hernia   . Hx: UTI (urinary tract infection)   . Hypertension   . Lower extremity neuropathy 08/23/2013   On B12 therapy with numbness in the feet bilaterally no  evidence of diabetes   . Ocular migraine    jagged vision, a few per month  . OSA (obstructive sleep apnea) 12/21/2016   on CPAP  . Peripheral neuropathy    treated by Dr Posey Pronto (07/2015)  . Persistent atrial fibrillation   . Severe tricuspid regurgitation    Noted Aug 2019 during a fib workup. Severe LAE as well    Past Surgical History:  Procedure Laterality Date  . 48 hr Holter Monitor  07/2018   Persistent Afib (rate 33 - 124 bpm)  . APPENDECTOMY    . CARDIOVERSION N/A 10/02/2018   Procedure: CARDIOVERSION;  Surgeon: Acie Fredrickson, Wonda Cheng, MD;  Location: [facility] ENDOSCOPY;  Service: Cardiovascular;  Laterality: N/A;  . CARPAL TUNNEL RELEASE    . CATARACT EXTRACTION    . EXCISION MORTON'S NEUROMA     Right foot  . INTRAOCULAR LENS IMPLANT, SECONDARY    . Moles removed    . MOUTH SURGERY     (For Exostosis)  . NUCLEAR  07/2018   EF 71 %. LOW RISK - no ischemia or Infarct.   Marland Kitchen ROTATOR CUFF REPAIR    . TONSILLECTOMY    . TRANSTHORACIC ECHOCARDIOGRAM  07/2018   Normal LV Fxn (EF 60-65%) no RWMA.  Severe LA dilation & Severe TR.    Family History  Problem Relation Age of Onset  . Rectal cancer Father        colostomy  . Heart failure Father        Died, 89  . Other Mother        Died, 53. old age.   . Barrett's esophagus Son   . Colon cancer Neg Hx   . Esophageal cancer Neg Hx   . Stomach cancer Neg Hx     Social History   Socioeconomic History  . Marital status: Married    Spouse name: Not on file  . Number of children: 2  . Years of education: Not on file  . Highest education level: Not on file  Occupational History  . Occupation: Retired  Scientific laboratory technician  . Financial resource strain: Not on file  . Food insecurity:    Worry: Not on file    Inability: Not on file  . Transportation needs:    Medical: Not on file    Non-medical: Not on file  Tobacco Use  . Smoking status: Former Smoker    Packs/day: 2.00    Years: 13.00    Pack years: 26.00    Types: Cigarettes      Start date: 12/26/1954    Last attempt to quit: 03/26/1968    Years since quitting: 50.8  . Smokeless tobacco: Never Used  . Tobacco comment: Quit in 1969 - smokeed 2-3PPD , was 3 PPD at her heaviest for about 3 years.   Substance and Sexual Activity  . Alcohol use: Yes    Alcohol/week: 1.0 standard drinks    Types: 1 Glasses of wine per week    Comment: 1 glass  of wine per day  . Drug use: No  . Sexual activity: Yes  Lifestyle  . Physical activity:    Days per week: Not on file    Minutes per session: Not on file  . Stress: Not on file  Relationships  . Social connections:    Talks on phone: Not on file    Gets together: Not on file    Attends religious service: Not on file    Active member of club or organization: Not on file    Attends meetings of clubs or organizations: Not on file    Relationship status: Not on file  . Intimate partner violence:    Fear of current or ex partner: Not on file    Emotionally abused: Not on file    Physically abused: Not on file    Forced sexual activity: Not on file  Other Topics Concern  . Not on file  Social History Narrative   Married. Husband is patient of Dr. Yong Channel.    Married 1958. 2 children. 4 grandkids (3 girls, 1 boy).    Retired from [employer].   Hobbies: genealogy and DNA    Current Outpatient Medications  Medication Sig Dispense Refill  . calcium carbonate (TUMS - DOSED IN MG ELEMENTAL CALCIUM) 500 MG chewable tablet Chew 1 tablet by mouth daily.     . chlorthalidone (HYGROTON) 25 MG tablet Take 1 tablet (25 mg total) by mouth daily. 30 tablet 6  . Cholecalciferol (VITAMIN D3) 5000 units CAPS Take 1,000 Units by mouth daily.     . famotidine (PEPCID) 20 MG tablet Take 20 mg by mouth at bedtime.    . Multiple Vitamins-Minerals (ICAPS AREDS 2) CAPS Take 2 capsules by mouth daily.     . nadolol (CORGARD) 40 MG tablet Take 0.5 tablets (20 mg total) by mouth daily. (Patient taking  differently: Take 40 mg by mouth daily. ) 903 tablet 3  . Omega-3 Fatty Acids (FISH OIL) 1200 MG CAPS Take 1,200 mg by mouth daily.    Marland Kitchen warfarin (COUMADIN) 5 MG tablet Take 1 tablet (5 mg total) by mouth one time only at 6 PM. Or as directed.. (Patient taking differently: Take 2.5-5 mg by mouth See admin instructions. Take 2.5 mg by mouth daily on Monday, Wednesday and Saturday. Take 5 mg by mouth daily on all other days.) 30 tablet 4   No current facility-administered medications for this visit.     Allergies  Allergen Reactions  . Clarithromycin Nausea Only  . Erythromycin Base Nausea And Vomiting  . Levofloxacin Other (See Comments)    Patient has a history of a Achilles tendon tear and developed tendon pain while on medication  . Potassium Chloride Hives  . Metronidazole Rash  . Penicillins Rash and Other (See Comments)    Has patient had a PCN reaction causing immediate rash, facial/tongue/throat swelling, SOB or lightheadedness with hypotension: Unknown Has patient had a PCN reaction causing severe rash involving mucus membranes or skin necrosis: No Has patient had a PCN reaction that required hospitalization: Yes Has patient had a PCN reaction occurring within the last 10 years: No If all of the above answers are "NO", then may proceed with Cephalosporin use.       Review of Systems:   General:  normal appetite, decreased energy, no weight gain, no weight loss, no fever  Cardiac:  no chest pain with exertion, no chest pain at rest, +SOB with low level  exertion, no resting SOB, no PND, no orthopnea, + palpitations, + arrhythmia, + atrial fibrillation, no LE edema, + dizzy spells, no syncope  Respiratory:  + shortness of breath, no home oxygen, no productive cough, no dry cough, no bronchitis, no wheezing, no hemoptysis, no asthma, no pain with inspiration or cough, + sleep apnea, + CPAP at night  GI:   no difficulty swallowing, + reflux, no frequent heartburn, + hiatal hernia,  no abdominal pain, no constipation, no diarrhea, no hematochezia, no hematemesis, no melena  GU:   no dysuria,  no frequency, no urinary tract infection, no hematuria, no kidney stones, no kidney disease  Vascular:  no pain suggestive of claudication, no pain in feet, no leg cramps, no varicose veins, no DVT, no non-healing foot ulcer  Neuro:   no stroke, no TIA's, no seizures, no headaches, no temporary blindness one eye,  no slurred speech, + peripheral neuropathy, no chronic pain, no instability of gait, no memory/cognitive dysfunction  Musculoskeletal: + arthritis, no joint swelling, no myalgias, no difficulty walking, no mobility   Skin:   no rash, no itching, no skin infections, no pressure sores or ulcerations  Psych:   no anxiety, no depression, no nervousness, no unusual recent stress  Eyes:   no blurry vision, + floaters, no recent vision changes, + wears glasses or contacts  ENT:   no hearing loss, no loose or painful teeth, no dentures, last saw dentist 9 months ago  Hematologic:  no easy bruising, no abnormal bleeding, no clotting disorder, no frequent epistaxis  Endocrine:  no diabetes, does not check CBG's at home     Physical Exam:   BP 134/70   Pulse 84   Resp 16   Ht 5' 5.5" (1.664 m)   Wt 167 lb (75.8 kg)   LMP  (LMP Unknown)   SpO2 97% Comment: RA  BMI 27.37 kg/m   General:  Mildly obese,  well-appearing  HEENT:  Unremarkable   Neck:   no JVD, no bruits, no adenopathy   Chest:   clear to auscultation, symmetrical breath sounds, no wheezes, no rhonchi   CV:   Irregular rate and rhythm, grade II/VI holosystolic murmur best at apex   Abdomen:  soft, non-tender, no masses   Extremities:  warm, well-perfused, pulses diminished but palpable, no LE edema  Rectal/GU  Deferred  Neuro:   Grossly non-focal and symmetrical throughout  Skin:   Clean and dry, no rashes, no breakdown   Diagnostic Tests:  Transthoracic Echocardiography  Patient:    Consepcion, Utt MR #:        [id number] Study Date: 08/02/2018 Gender:     F Age:        27 Height:     165.1 cm Weight:     76.2 kg BSA:        1.89 m^2 Pt. Status: Room:   SONOGRAPHER  Wyatt Mage, [place]  ATTENDING    Glenetta Hew, MD  ORDERING     Glenetta Hew, MD  [place], MD  PERFORMING   [facility], Outpatient  cc:  ------------------------------------------------------------------- LV EF: 60% -   65%  ------------------------------------------------------------------- Indications:      Dyspnea on exertion (R06.09).  ------------------------------------------------------------------- History:   PMH:   Dyspnea.  Atrial fibrillation.  Risk factors: OSA. Former tobacco use. Hypertension. Dyslipidemia.  ------------------------------------------------------------------- Study Conclusions  - Left ventricle: The cavity size was normal. Systolic function was   normal. The estimated ejection fraction was  in the range of 60%   to 65%. Wall motion was normal; there were no regional wall   motion abnormalities. - Aortic valve: Transvalvular velocity was within the normal range.   There was no stenosis. There was no regurgitation. - Mitral valve: Transvalvular velocity was within the normal range.   There was no evidence for stenosis. There was mild regurgitation. - Left atrium: The atrium was severely dilated. - Right ventricle: The cavity size was normal. Wall thickness was   normal. Systolic function was normal. - Right atrium: The atrium was mildly dilated. - Atrial septum: No defect or patent foramen ovale was identified   by color flow Doppler. - Tricuspid valve: There was severe regurgitation. - Pulmonary arteries: Systolic pressure was within the normal   range. PA peak pressure: 30 mm Hg (S).  ------------------------------------------------------------------- Study data:  No prior study was available for comparison.  Study status:  Routine.  Procedure:  The patient  reported no pain pre or post test. Transthoracic echocardiography. Image quality was adequate.  Study completion:  There were no complications. Transthoracic echocardiography.  M-mode, complete 2D, spectral Doppler, and color Doppler.  Birthdate:  Patient birthdate: 12/25/1935.  Age:  Patient is 84 yr old.  Sex:  Gender: female. BMI: 28 kg/m^2.  Blood pressure:     138/79  Patient status: Outpatient.  Study date:  Study date: 08/02/2018. Study time: 10:03 AM.  Location:  [facility]  -------------------------------------------------------------------  ------------------------------------------------------------------- Left ventricle:  The cavity size was normal. Systolic function was normal. The estimated ejection fraction was in the range of 60% to 65%. Wall motion was normal; there were no regional wall motion abnormalities.  ------------------------------------------------------------------- Aortic valve:   Trileaflet; mildly thickened, mildly calcified leaflets. Mobility was not restricted.  Doppler:  Transvalvular velocity was within the normal range. There was no stenosis. There was no regurgitation.  ------------------------------------------------------------------- Aorta:  Aortic root: The aortic root was normal in size.  ------------------------------------------------------------------- Mitral valve:   Structurally normal valve.   Mobility was not restricted.  Doppler:  Transvalvular velocity was within the normal range. There was no evidence for stenosis. There was mild regurgitation.  ------------------------------------------------------------------- Left atrium:  The atrium was severely dilated.  ------------------------------------------------------------------- Atrial septum:  No defect or patent foramen ovale was identified by color flow Doppler.  ------------------------------------------------------------------- Right ventricle:  The cavity  size was normal. Wall thickness was normal. Systolic function was normal.  ------------------------------------------------------------------- Pulmonic valve:    Structurally normal valve.   Cusp separation was normal.  Doppler:  Transvalvular velocity was within the normal range. There was no evidence for stenosis. There was no regurgitation.  ------------------------------------------------------------------- Tricuspid valve:   Structurally normal valve.    Doppler: Transvalvular velocity was within the normal range. There was severe regurgitation.  ------------------------------------------------------------------- Pulmonary artery:   The main pulmonary artery was normal-sized. Systolic pressure was within the normal range.  ------------------------------------------------------------------- Right atrium:  The atrium was mildly dilated.  ------------------------------------------------------------------- Pericardium:  There was no pericardial effusion.  ------------------------------------------------------------------- Systemic veins: Inferior vena cava: The vessel was normal in size. The respirophasic diameter changes were in the normal range (>= 50%), consistent with normal central venous pressure.  ------------------------------------------------------------------- Measurements   Left ventricle                         Value        Reference  LV ID, ED, PLAX chordal  44    mm     43 - 52  LV ID, ES, PLAX chordal                28    mm     23 - 38  LV fx shortening, PLAX chordal         36    %      >=29  LV PW thickness, ED                    11    mm     ---------  IVS/LV PW ratio, ED                    0.91         <=1.3  Stroke volume, 2D                      93    ml     ---------  Stroke volume/bsa, 2D                  49    ml/m^2 ---------    Ventricular septum                     Value        Reference  IVS thickness, ED                       10    mm     ---------    LVOT                                   Value        Reference  LVOT ID, S                             22    mm     ---------  LVOT area                              3.8   cm^2   ---------  LVOT peak velocity, S                  111   cm/s   ---------  LVOT mean velocity, S                  77.6  cm/s   ---------  LVOT VTI, S                            24.5  cm     ---------    Aorta                                  Value        Reference  Aortic root ID, ED                     38    mm     ---------    Left atrium  Value        Reference  LA ID, A-P, ES                         45    mm     ---------  LA ID/bsa, A-P                 (H)     2.38  cm/m^2 <=2.2  LA volume, S                           82.7  ml     ---------  LA volume/bsa, S                       43.8  ml/m^2 ---------  LA volume, ES, 1-p A4C                 75.8  ml     ---------  LA volume/bsa, ES, 1-p A4C             40.1  ml/m^2 ---------  LA volume, ES, 1-p A2C                 87.2  ml     ---------  LA volume/bsa, ES, 1-p A2C             46.2  ml/m^2 ---------    Pulmonary arteries                     Value        Reference  PA pressure, S, DP                     30    mm Hg  <=30    Tricuspid valve                        Value        Reference  Tricuspid regurg peak velocity         258   cm/s   ---------  Tricuspid peak RV-RA gradient          27    mm Hg  ---------    Systemic veins                         Value        Reference  Estimated CVP                          3     mm Hg  ---------    Right ventricle                        Value        Reference  TAPSE                                  19.5  mm     ---------  RV pressure, S, DP                     30    mm Hg  <=30  RV s&', lateral, S  10.7  cm/s   ---------  Legend: (L)  and  (H)  mark values outside specified reference  range.  ------------------------------------------------------------------- Prepared and Electronically Authenticated by  Skeet Latch, MD 2019-08-08T14:51:21    NUCLEAR STRESS TEST   Nuclear stress EF: 71%.  The left ventricular ejection fraction is hyperdynamic (>65%).  There was no ST segment deviation noted during stress.  The study is normal.  This is a low risk study.   Normal stress nuclear study with no ischemia or infarction; EF 71 with normal wall motion.     Impression:  Patient has severe tricuspid regurgitation and persistent atrial fibrillation that has failed an attempt at DC cardioversion.  She describes a history consistent with a long gradual progression of symptoms of exertional shortness of breath and fatigue.  For the last several months the patient complains that she gets short of breath with low-level activity, consistent with symptoms of chronic diastolic congestive heart failure, New York Heart Association functional class III.  At least some of the patient's symptoms of exertional shortness of breath may be related to physical deconditioning and decreased activity.  She denies symptoms of lower extremity edema or abdominal bloating to suggest the presence of significant right-sided heart failure.  She does not experience any exertional chest pain or chest tightness, and recent nuclear stress testing was felt to be low risk.  I have personally reviewed the patient's recent transthoracic echocardiogram.  The patient appears to have normal left ventricular size and systolic function.  There is severe left atrial enlargement and probably mild mitral regurgitation.  There is severe tricuspid regurgitation.  Right ventricular size and function appear essentially normal.  There does not appear to be any significant left ventricular septal displacement or pulmonary hypertension.  Options include continued medical therapy with long-term anticoagulation and  rate control for management of atrial fibrillation with careful attention to medical therapy for congestive heart failure.  Catheter-based ablation could be attempted, but I agree that the long-term likelihood of success would probably be relatively low because of the patient's advanced age, severe left atrial enlargement, and severe tricuspid regurgitation.  Finally, it would not be unreasonable to consider elective tricuspid valve repair and Maze procedure.   However, as a next step I favor obtaining transesophageal echocardiogram to make sure that the patient's on mitral regurgitation is mild.  Her symptoms are predominantly left-sided, and on physical examination her murmur sounds more like a mitral valve murmur than tricuspid.  It would not be unreasonable to check a left and right heart catheterization at the time of TEE to rule out the possibility of proximal coronary artery disease with balanced ischemia.    Plan:  I have discussed the nature of the patient's symptoms and the results of previous diagnostic tests at length with the patient and her family at the bedside in the office today.  Alternative treatment strategies have been discussed in detail.  All of their questions have been addressed.  As a next step the patient is interested in proceeding with TEE and diagnostic cardiac catheterization.  We will obtain a upper GI barium swallow to rule out the presence of significant esophageal stricture prior to TEE.  Patient will return to our office to review the results of these tests in approximately 3 weeks.   I spent in excess of 90 minutes during the conduct of this office consultation and >50% of this time involved direct face-to-face encounter with the patient for counseling and/or coordination of their care.  Valentina Gu. Roxy Manns, MD 01/07/2019 11:39 AM

## 2019-01-08 ENCOUNTER — Telehealth: Payer: Self-pay | Admitting: *Deleted

## 2019-01-08 NOTE — Telephone Encounter (Signed)
PER DR HARDING - SCHEDULE PATIENT FOR FOLLOW UP WITH AN EXTENDER - FOR H&P - SCHEDULE TEE AND RIGHT HEART CATH    RN SPOKE TO PATIENT . SHE STATES DR Austin Miles WITH AT LAST APPOINTMENT  AND WAS IN AGREEMENT TO PROCEED WITH APPOINTMENT  SCHEDULE FOR 01/15/19 APPT WITH K. LAWRENCE DNP AT 2:30 PM --  PATIENT ALSO STATES SHE NEED TO  ESOPHAGUS EVALUATED  PRIOR TO TEE , BECAUSE SHE HAS HAS STRICTURE IN THE PAST .  TES WILL BE DONE 01/15/19 AT 9:30 AM  THIS WAS SCHEDULE BY DR Roxy Manns 'S OFFICE.  PATIENT VERBALIZED UNDERSTANDING.

## 2019-01-14 NOTE — Progress Notes (Signed)
`Cardiology Office Note   Date:  01/14/2019   ID:  Adriana Hendricks, DOB Oct 28, 1935, MRN [id number]  PCP:  Marin Olp, MD  Cardiologist:  Ulyses Jarred chief complaint on file.    History of Present Illness: Adriana Hendricks is a 84 y.o. female who presents for ongoing assessment and management of persistent atrial fibrillation, DOE, and tricuspid regurgitation. She is on coumadin and nadolol for rate control of atrial fib, she is also being followed by [name], NP in the Afib clinic.   Due to profound dyspnea she was to be considered for MAZE procedure with tricuspid valve repair, and was referred to the valve clinic. Dyspnea was believed to be multifactorial, but it was not entirely clear if TR was causing the shortness of breath. She was to speak with PCP about evaluation of pulmonary etiology. Dr. [name] felt that TEE will be very difficult because of her history of esophageal stricture.   (copied for accuracy from 11/27/2018 note from Dr. [name]). She does not have PND orthopnea to suspect that this is coming from the left heart.  There is also no significant edema or ascites which would argue for occult cor pulmonale.  This would lead to the question of it being functional tricuspid regurgitation from extreme atrial dilation related to A. fib.  We will need to review possible benefit of tricuspid valve repair/ring annuloplasty along with Maze procedure.  Will consult CT surgery for their opinion and stand.  To do any further evaluation such as TEE or cardiac catheterization.  She was seen by Dr. Roxy Manns of [facility] on 01/07/2019 for recommendations. He recommended TEE to make sure that the patent's MR is mild, and also to have R and L heart cath at the time of the TEE to r/o possibility of proximal CAD and balanced ischemia. Dr. Ricard Dillon also ordered a barium swallow to r/o the presence of significant esophageal stricture prior to TEE.   Barium swallow was completed on 01/15/2019 and did not  reveal esophageal stricture.   She comes today to be scheduled for TEE and RHC and LHC, with Dr. [name]. She is has also been seen by [name] for coumadin recommendations. She has many questions as she has been caught off guard by the timing of these two procedures.    Past Medical History:  Diagnosis Date  . Achilles tendon rupture   . Arthritis of hand, right   . Carpal tunnel syndrome 09/04/2013  . Collagenous colitis 2005  . Colon polyps 2010   Tubular adenoma and hyperplastic  . Dental infection 10/25/2014  . Diverticulosis   . Esophageal stenosis   . Essential tremor   . GERD (gastroesophageal reflux disease)    hx hiatal hernia, hx esophagitis, hx stricture  . Heart murmur   . Hiatal hernia   . Hx: UTI (urinary tract infection)   . Hypertension   . Lower extremity neuropathy 08/23/2013   On B12 therapy with numbness in the feet bilaterally no evidence of diabetes   . Ocular migraine    jagged vision, a few per month  . OSA (obstructive sleep apnea) 12/21/2016   on CPAP  . Peripheral neuropathy    treated by Dr Posey Pronto (07/2015)  . Persistent atrial fibrillation   . Severe tricuspid regurgitation    Noted Aug 2019 during a fib workup. Severe LAE as well    Past Surgical History:  Procedure Laterality Date  . 48 hr Holter Monitor  07/2018   Persistent  Afib (rate 33 - 124 bpm)  . APPENDECTOMY    . CARDIOVERSION N/A 10/02/2018   Procedure: CARDIOVERSION;  Surgeon: Acie Fredrickson, Wonda Cheng, MD;  [facility]: [facility] END[facility];  Service: Cardiovascular;  Laterality: N/A;  . CARPAL TUNNEL RELEASE    . CATARACT EXTRACTION    . EXCISION MORTON'S NEUROMA     Right foot  . INTRAOCULAR LENS IMPLANT, SECONDARY    . Moles removed    . MOUTH SURGERY     (For Exostosis)  . NUCLEAR  07/2018   EF 71 %. LOW RISK - no ischemia or Infarct.   Marland Kitchen ROTATOR CUFF REPAIR    . TONSILLECTOMY    . TRANSTHORACIC ECHOCARDIOGRAM  07/2018   Normal LV Fxn (EF 60-65%) no RWMA.  Severe LA dilation & Severe  TR.     Current Outpatient Medications  Medication Sig Dispense Refill  . calcium carbonate (TUMS - DOSED IN MG ELEMENTAL CALCIUM) 500 MG chewable tablet Chew 1 tablet by mouth daily.     . chlorthalidone (HYGROTON) 25 MG tablet Take 1 tablet (25 mg total) by mouth daily. 30 tablet 6  . Cholecalciferol (VITAMIN D3) 5000 units CAPS Take 1,000 Units by mouth daily.     . famotidine (PEPCID) 20 MG tablet Take 20 mg by mouth at bedtime.    . Multiple Vitamins-Minerals (ICAPS AREDS 2) CAPS Take 2 capsules by mouth daily.     . nadolol (CORGARD) 40 MG tablet Take 0.5 tablets (20 mg total) by mouth daily. (Patient taking differently: Take 40 mg by mouth daily. ) 903 tablet 3  . Omega-3 Fatty Acids (FISH OIL) 1200 MG CAPS Take 1,200 mg by mouth daily.    Marland Kitchen warfarin (COUMADIN) 5 MG tablet Take 1 tablet (5 mg total) by mouth one time only at 6 PM. Or as directed.. (Patient taking differently: Take 2.5-5 mg by mouth See admin instructions. Take 2.5 mg by mouth daily on Monday, Wednesday and Saturday. Take 5 mg by mouth daily on all other days.) 30 tablet 4   No current facility-administered medications for this visit.     Allergies:   Clarithromycin; Erythromycin base; Levofloxacin; Potassium chloride; Metronidazole; and Penicillins    Social History:  The patient  reports that she quit smoking about 50 years ago. Her smoking use included cigarettes. She started smoking about 64 years ago. She has a 26.00 pack-year smoking history. She has never used smokeless tobacco. She reports current alcohol use of about 1.0 standard drinks of alcohol per week. She reports that she does not use drugs.   Family History:  The patient's family history includes Barrett's esophagus in her son; Heart failure in her father; Other in her mother; Rectal cancer in her father.    ROS: All other systems are reviewed and negative. Unless otherwise mentioned in H&P    PHYSICAL EXAM: VS:  LMP  (LMP Unknown)  , BMI There is  no height or weight on file to calculate BMI. GEN: Well nourished, well developed, in no acute distress HEENT: normal Neck: no JVD, carotid bruits, or masses Cardiac: RRR; no murmurs, rubs, or gallops,no edema  Respiratory:  Clear to auscultation bilaterally, normal work of breathing GI: soft, nontender, nondistended, + BS MS: no deformity or atrophy Skin: warm and dry, no rash Neuro:  Strength and sensation are intact Psych: euthymic mood, full affect   EKG: Atrial fib with competing junctional PPM. Rate of 83 bpm.   Recent Labs: 09/18/2018: ALT 20; Hemoglobin 14.6; Platelets 187.0; TSH  2.40 11/06/2018: BUN 19; Creatinine, Ser 0.85; Potassium 3.8; Sodium 136    Lipid Panel    Component Value Date/Time   CHOL 191 09/18/2018 1401   TRIG 106.0 09/18/2018 1401   HDL 53.70 09/18/2018 1401   CHOLHDL 4 09/18/2018 1401   VLDL 21.2 09/18/2018 1401   LDLCALC 116 (H) 09/18/2018 1401   LDLDIRECT 128.8 01/17/2011 0941      Wt Readings from Last 3 Encounters:  01/07/19 167 lb (75.8 kg)  11/27/18 168 lb 9.6 oz (76.5 kg)  10/18/18 174 lb (78.9 kg)      Other studies Reviewed: Barium Swallow 2019/01/27  IMPRESSION: 1. No gross esophageal stricture. 13 mm barium tablet passes readily into the stomach when taken with water. 2. Nonspecific esophageal motility disorder.  ASSESSMENT AND PLAN:  1. Atrial fib: Rate is currently controlled on nadolol. She is on coumadin but this is held today in light of cath on 01/16/2019. Maze procedure is planned.   2. Pre-Operative evaluation for tricuspid valve repair: She is planned for TEE on 01-27-19 with Dr. Fransico Him, for evaluation to make sure MR is mild.  I have discussed the procedure and risks/benefits. She is willing to proceed. She is scheduled at 8 am.  3. CAD: Right and left cath is scheduled with Dr [name] at 11 am on 01-27-19 to evaluate for CAD. I have explained the procedure.  The patient understands that risks include but are  not limited to stroke (1 in 1000), death (1 in 60), kidney failure [usually temporary] (1 in 500), bleeding (1 in 200), allergic reaction [possibly serious] (1 in 200), and agrees to proceed.   4. Hypertension: Will hold chlorthalidone prior to cath.   Current medicines are reviewed at length with the patient today.    Labs/ tests ordered today include: Pre cath labs, Right and Left Heart Cath, and TEE.   Phill Myron. West Pugh, ANP, AACC   01/14/2019 8:58 AM    [facility] [place] [address] Office [phone] Fax [phone]

## 2019-01-14 NOTE — H&P (View-Only) (Signed)
`Cardiology Office Note   Date:  01/14/2019   ID:  Adriana Hendricks, DOB 02-13-1935, MRN [id number]  PCP:  Marin Olp, MD  Cardiologist:  Ulyses Jarred chief complaint on file.    History of Present Illness: Adriana Hendricks is a 84 y.o. female who presents for ongoing assessment and management of persistent atrial fibrillation, DOE, and tricuspid regurgitation. She is on coumadin and nadolol for rate control of atrial fib, she is also being followed by [name], NP in the Afib clinic.   Due to profound dyspnea she was to be considered for MAZE procedure with tricuspid valve repair, and was referred to the valve clinic. Dyspnea was believed to be multifactorial, but it was not entirely clear if TR was causing the shortness of breath. She was to speak with PCP about evaluation of pulmonary etiology. Dr. [name] felt that TEE will be very difficult because of her history of esophageal stricture.   (copied for accuracy from 11/27/2018 note from Dr. [name]). She does not have PND orthopnea to suspect that this is coming from the left heart.  There is also no significant edema or ascites which would argue for occult cor pulmonale.  This would lead to the question of it being functional tricuspid regurgitation from extreme atrial dilation related to A. fib.  We will need to review possible benefit of tricuspid valve repair/ring annuloplasty along with Maze procedure.  Will consult CT surgery for their opinion and stand.  To do any further evaluation such as TEE or cardiac catheterization.  She was seen by Dr. Roxy Manns of [facility] on 01/07/2019 for recommendations. He recommended TEE to make sure that the patent's MR is mild, and also to have R and L heart cath at the time of the TEE to r/o possibility of proximal CAD and balanced ischemia. Dr. Ricard Dillon also ordered a barium swallow to r/o the presence of significant esophageal stricture prior to TEE.   Barium swallow was completed on 01/15/2019 and did not  reveal esophageal stricture.   She comes today to be scheduled for TEE and RHC and LHC, with Dr. [name]. She is has also been seen by [name] for coumadin recommendations. She has many questions as she has been caught off guard by the timing of these two procedures.    Past Medical History:  Diagnosis Date  . Achilles tendon rupture   . Arthritis of hand, right   . Carpal tunnel syndrome 09/04/2013  . Collagenous colitis 2005  . Colon polyps 2010   Tubular adenoma and hyperplastic  . Dental infection 10/25/2014  . Diverticulosis   . Esophageal stenosis   . Essential tremor   . GERD (gastroesophageal reflux disease)    hx hiatal hernia, hx esophagitis, hx stricture  . Heart murmur   . Hiatal hernia   . Hx: UTI (urinary tract infection)   . Hypertension   . Lower extremity neuropathy 08/23/2013   On B12 therapy with numbness in the feet bilaterally no evidence of diabetes   . Ocular migraine    jagged vision, a few per month  . OSA (obstructive sleep apnea) 12/21/2016   on CPAP  . Peripheral neuropathy    treated by Dr Posey Pronto (07/2015)  . Persistent atrial fibrillation   . Severe tricuspid regurgitation    Noted Aug 2019 during a fib workup. Severe LAE as well    Past Surgical History:  Procedure Laterality Date  . 48 hr Holter Monitor  07/2018   Persistent  Afib (rate 33 - 124 bpm)  . APPENDECTOMY    . CARDIOVERSION N/A 10/02/2018   Procedure: CARDIOVERSION;  Surgeon: Acie Fredrickson, Wonda Cheng, MD;  Location: [facility] ENDOSCOPY;  Service: Cardiovascular;  Laterality: N/A;  . CARPAL TUNNEL RELEASE    . CATARACT EXTRACTION    . EXCISION MORTON'S NEUROMA     Right foot  . INTRAOCULAR LENS IMPLANT, SECONDARY    . Moles removed    . MOUTH SURGERY     (For Exostosis)  . NUCLEAR  07/2018   EF 71 %. LOW RISK - no ischemia or Infarct.   Marland Kitchen ROTATOR CUFF REPAIR    . TONSILLECTOMY    . TRANSTHORACIC ECHOCARDIOGRAM  07/2018   Normal LV Fxn (EF 60-65%) no RWMA.  Severe LA dilation & Severe  TR.     Current Outpatient Medications  Medication Sig Dispense Refill  . calcium carbonate (TUMS - DOSED IN MG ELEMENTAL CALCIUM) 500 MG chewable tablet Chew 1 tablet by mouth daily.     . chlorthalidone (HYGROTON) 25 MG tablet Take 1 tablet (25 mg total) by mouth daily. 30 tablet 6  . Cholecalciferol (VITAMIN D3) 5000 units CAPS Take 1,000 Units by mouth daily.     . famotidine (PEPCID) 20 MG tablet Take 20 mg by mouth at bedtime.    . Multiple Vitamins-Minerals (ICAPS AREDS 2) CAPS Take 2 capsules by mouth daily.     . nadolol (CORGARD) 40 MG tablet Take 0.5 tablets (20 mg total) by mouth daily. (Patient taking differently: Take 40 mg by mouth daily. ) 903 tablet 3  . Omega-3 Fatty Acids (FISH OIL) 1200 MG CAPS Take 1,200 mg by mouth daily.    Marland Kitchen warfarin (COUMADIN) 5 MG tablet Take 1 tablet (5 mg total) by mouth one time only at 6 PM. Or as directed.. (Patient taking differently: Take 2.5-5 mg by mouth See admin instructions. Take 2.5 mg by mouth daily on Monday, Wednesday and Saturday. Take 5 mg by mouth daily on all other days.) 30 tablet 4   No current facility-administered medications for this visit.     Allergies:   Clarithromycin; Erythromycin base; Levofloxacin; Potassium chloride; Metronidazole; and Penicillins    Social History:  The patient  reports that she quit smoking about 50 years ago. Her smoking use included cigarettes. She started smoking about 64 years ago. She has a 26.00 pack-year smoking history. She has never used smokeless tobacco. She reports current alcohol use of about 1.0 standard drinks of alcohol per week. She reports that she does not use drugs.   Family History:  The patient's family history includes Barrett's esophagus in her son; Heart failure in her father; Other in her mother; Rectal cancer in her father.    ROS: All other systems are reviewed and negative. Unless otherwise mentioned in H&P    PHYSICAL EXAM: VS:  LMP  (LMP Unknown)  , BMI There is  no height or weight on file to calculate BMI. GEN: Well nourished, well developed, in no acute distress HEENT: normal Neck: no JVD, carotid bruits, or masses Cardiac: RRR; no murmurs, rubs, or gallops,no edema  Respiratory:  Clear to auscultation bilaterally, normal work of breathing GI: soft, nontender, nondistended, + BS MS: no deformity or atrophy Skin: warm and dry, no rash Neuro:  Strength and sensation are intact Psych: euthymic mood, full affect   EKG: Atrial fib with competing junctional PPM. Rate of 83 bpm.   Recent Labs: 09/18/2018: ALT 20; Hemoglobin 14.6; Platelets 187.0; TSH  2.40 11/06/2018: BUN 19; Creatinine, Ser 0.85; Potassium 3.8; Sodium 136    Lipid Panel    Component Value Date/Time   CHOL 191 09/18/2018 1401   TRIG 106.0 09/18/2018 1401   HDL 53.70 09/18/2018 1401   CHOLHDL 4 09/18/2018 1401   VLDL 21.2 09/18/2018 1401   LDLCALC 116 (H) 09/18/2018 1401   LDLDIRECT 128.8 01/17/2011 0941      Wt Readings from Last 3 Encounters:  01/07/19 167 lb (75.8 kg)  11/27/18 168 lb 9.6 oz (76.5 kg)  10/18/18 174 lb (78.9 kg)      Other studies Reviewed: Barium Swallow Jan 23, 2019  IMPRESSION: 1. No gross esophageal stricture. 13 mm barium tablet passes readily into the stomach when taken with water. 2. Nonspecific esophageal motility disorder.  ASSESSMENT AND PLAN:  1. Atrial fib: Rate is currently controlled on nadolol. She is on coumadin but this is held today in light of cath on 01/16/2019. Maze procedure is planned.   2. Pre-Operative evaluation for tricuspid valve repair: She is planned for TEE on 2019/01/23 with Dr. Fransico Him, for evaluation to make sure MR is mild.  I have discussed the procedure and risks/benefits. She is willing to proceed. She is scheduled at 8 am.  3. CAD: Right and left cath is scheduled with Dr [name] at 11 am on 23-Jan-2019 to evaluate for CAD. I have explained the procedure.  The patient understands that risks include but are  not limited to stroke (1 in 1000), death (1 in 69), kidney failure [usually temporary] (1 in 500), bleeding (1 in 200), allergic reaction [possibly serious] (1 in 200), and agrees to proceed.   4. Hypertension: Will hold chlorthalidone prior to cath.   Current medicines are reviewed at length with the patient today.    Labs/ tests ordered today include: Pre cath labs, Right and Left Heart Cath, and TEE.   Phill Myron. West Pugh, ANP, AACC   01/14/2019 8:58 AM    [facility] [place] [address] Office [phone] Fax [phone]

## 2019-01-15 ENCOUNTER — Ambulatory Visit (INDEPENDENT_AMBULATORY_CARE_PROVIDER_SITE_OTHER): Payer: Medicare Other | Admitting: Adult Health

## 2019-01-15 ENCOUNTER — Ambulatory Visit
Admission: RE | Admit: 2019-01-15 | Discharge: 2019-01-15 | Disposition: A | Discharge status: Home / Self Care | Payer: Medicare Other | Source: Ambulatory Visit | Attending: Thoracic Surgery (Cardiothoracic Vascular Surgery) | Admitting: Thoracic Surgery (Cardiothoracic Vascular Surgery)

## 2019-01-15 ENCOUNTER — Encounter: Payer: Self-pay | Admitting: Adult Health

## 2019-01-15 ENCOUNTER — Ambulatory Visit (INDEPENDENT_AMBULATORY_CARE_PROVIDER_SITE_OTHER): Payer: Medicare Other | Admitting: Pharmacist Clinician (PhC)/ Clinical Pharmacy Specialist

## 2019-01-15 VITALS — BP 142/93 | HR 75 | Ht 65.5 in | Wt 170.0 lb

## 2019-01-15 DIAGNOSIS — I4819 Other persistent atrial fibrillation: Secondary | ICD-10-CM | POA: Diagnosis not present

## 2019-01-15 DIAGNOSIS — K222 Esophageal obstruction: Secondary | ICD-10-CM

## 2019-01-15 DIAGNOSIS — I2510 Atherosclerotic heart disease of native coronary artery without angina pectoris: Secondary | ICD-10-CM | POA: Diagnosis not present

## 2019-01-15 DIAGNOSIS — K224 Dyskinesia of esophagus: Secondary | ICD-10-CM | POA: Diagnosis not present

## 2019-01-15 DIAGNOSIS — I10 Essential (primary) hypertension: Secondary | ICD-10-CM | POA: Diagnosis not present

## 2019-01-15 DIAGNOSIS — Z7901 Long term (current) use of anticoagulants: Secondary | ICD-10-CM | POA: Diagnosis not present

## 2019-01-15 DIAGNOSIS — I482 Chronic atrial fibrillation, unspecified: Secondary | ICD-10-CM | POA: Diagnosis not present

## 2019-01-15 DIAGNOSIS — Z01810 Encounter for preprocedural cardiovascular examination: Secondary | ICD-10-CM

## 2019-01-15 LAB — POCT INR: INR: 2.4 (ref 2.0–3.0)

## 2019-01-15 LAB — CBC
HEMOGLOBIN: 13.9 g/dL (ref 11.1–15.9)
Hematocrit: 39.4 % (ref 34.0–46.6)
MCH: 31.9 pg (ref 26.6–33.0)
MCHC: 35.3 g/dL (ref 31.5–35.7)
MCV: 90 fL (ref 79–97)
Platelets: 184 10*3/uL (ref 150–450)
RBC: 4.36 x10E6/uL (ref 3.77–5.28)
RDW: 13.2 % (ref 11.7–15.4)
WBC: 7.8 10*3/uL (ref 3.4–10.8)

## 2019-01-15 LAB — BASIC METABOLIC PANEL
BUN/Creatinine Ratio: 28 (ref 12–28)
BUN: 23 mg/dL (ref 8–27)
CO2: 33 mmol/L — AB (ref 20–29)
Calcium: 10.5 mg/dL — ABNORMAL HIGH (ref 8.7–10.3)
Chloride: 96 mmol/L (ref 96–106)
Creatinine, Ser: 0.82 mg/dL (ref 0.57–1.00)
GFR calc Af Amer: 78 mL/min/{1.73_m2} (ref >59)
GFR, EST NON AFRICAN AMERICAN: 67 mL/min/{1.73_m2} (ref >59)
GLUCOSE: 101 mg/dL — AB (ref 65–99)
Potassium: 3.9 mmol/L (ref 3.5–5.2)
Sodium: 135 mmol/L (ref 134–144)

## 2019-01-15 NOTE — Patient Instructions (Addendum)
  TAYTUM SCHECK  01/15/2019  You are scheduled for a TEE and Cardiac Catheterization on Wednesday, January 22 with Dr. Glenetta Hew.  1. Please arrive at the [facility] (Main Entrance A) at [facility]: [address] [place], [place] [address] at 7:00 AM (This time is two hours before your procedure to ensure your preparation). Free valet parking service is available.  Special note: Every effort is made to have your procedure done on time. Please understand that emergencies sometimes delay scheduled procedures.  2. Diet: Do not eat solid foods after midnight.  The patient may have clear liquids until 5am upon the day of the procedure.  3. Labs: You will need to have blood drawn on Tuesday, January 21 at [facility] Open: 8am - 5pm (Lunch 12:30 - 1:30)   Phone: [phone]. You do not need to be fasting.  4. Medication instructions in preparation for your procedure:   Contrast Allergy: No  A. HOLD YOUR CHLORTHALIDONE THE MORNING OF YOUR PROCEDURE. B. FOLLOW DIRECTIONS FROM PHARMACIST FOR YOUR WARFARIN.  On the morning of your procedure, take your morning medicines NOT listed above.  You may use sips of water.  5. Plan for one night stay--bring personal belongings. 6. Bring a current list of your medications and current insurance cards. 7. You MUST have a responsible person to drive you home. 8. Someone MUST be with you the first 24 hours after you arrive home or your discharge will be delayed. 9. Please wear clothes that are easy to get on and off and wear slip-on shoes.  10. Follow-Up:  You will need a follow up appointment on 02-04-2019 @ 120PM.  Glenetta Hew, MD    Thank you for allowing [place] to care for you!   -- [facility]

## 2019-01-15 NOTE — Addendum Note (Signed)
Addended by: Lendon Colonel on: 01/15/2019 05:38 PM   Modules accepted: Orders, SmartSet

## 2019-01-16 ENCOUNTER — Ambulatory Visit (HOSPITAL_BASED_OUTPATIENT_CLINIC_OR_DEPARTMENT_OTHER): Payer: Medicare Other

## 2019-01-16 ENCOUNTER — Encounter (HOSPITAL_COMMUNITY): Payer: Self-pay | Admitting: *Deleted

## 2019-01-16 ENCOUNTER — Ambulatory Visit (HOSPITAL_COMMUNITY): Admit: 2019-01-16 | Payer: Medicare Other | Admitting: Cardiology

## 2019-01-16 ENCOUNTER — Other Ambulatory Visit: Payer: Self-pay

## 2019-01-16 ENCOUNTER — Encounter (HOSPITAL_COMMUNITY)
Admission: RE | Disposition: A | Discharge status: Home / Self Care | Payer: Self-pay | Source: Ambulatory Visit | Attending: Cardiology

## 2019-01-16 ENCOUNTER — Ambulatory Visit (HOSPITAL_COMMUNITY)
Admission: RE | Admit: 2019-01-16 | Discharge: 2019-01-16 | Disposition: A | Discharge status: Home / Self Care | Payer: Medicare Other | Source: Ambulatory Visit | Attending: Cardiology | Admitting: Cardiology

## 2019-01-16 DIAGNOSIS — R0609 Other forms of dyspnea: Secondary | ICD-10-CM | POA: Diagnosis not present

## 2019-01-16 DIAGNOSIS — Z888 Allergy status to other drugs, medicaments and biological substances status: Secondary | ICD-10-CM | POA: Diagnosis not present

## 2019-01-16 DIAGNOSIS — Z880 Allergy status to penicillin: Secondary | ICD-10-CM | POA: Insufficient documentation

## 2019-01-16 DIAGNOSIS — I2510 Atherosclerotic heart disease of native coronary artery without angina pectoris: Secondary | ICD-10-CM | POA: Insufficient documentation

## 2019-01-16 DIAGNOSIS — G4733 Obstructive sleep apnea (adult) (pediatric): Secondary | ICD-10-CM | POA: Insufficient documentation

## 2019-01-16 DIAGNOSIS — I420 Dilated cardiomyopathy: Secondary | ICD-10-CM | POA: Insufficient documentation

## 2019-01-16 DIAGNOSIS — G5600 Carpal tunnel syndrome, unspecified upper limb: Secondary | ICD-10-CM | POA: Diagnosis not present

## 2019-01-16 DIAGNOSIS — I4891 Unspecified atrial fibrillation: Secondary | ICD-10-CM | POA: Diagnosis not present

## 2019-01-16 DIAGNOSIS — G250 Essential tremor: Secondary | ICD-10-CM | POA: Insufficient documentation

## 2019-01-16 DIAGNOSIS — Z881 Allergy status to other antibiotic agents status: Secondary | ICD-10-CM | POA: Diagnosis not present

## 2019-01-16 DIAGNOSIS — Z7901 Long term (current) use of anticoagulants: Secondary | ICD-10-CM | POA: Insufficient documentation

## 2019-01-16 DIAGNOSIS — I071 Rheumatic tricuspid insufficiency: Secondary | ICD-10-CM

## 2019-01-16 DIAGNOSIS — I340 Nonrheumatic mitral (valve) insufficiency: Secondary | ICD-10-CM | POA: Diagnosis not present

## 2019-01-16 DIAGNOSIS — I10 Essential (primary) hypertension: Secondary | ICD-10-CM | POA: Insufficient documentation

## 2019-01-16 DIAGNOSIS — K219 Gastro-esophageal reflux disease without esophagitis: Secondary | ICD-10-CM | POA: Insufficient documentation

## 2019-01-16 DIAGNOSIS — I482 Chronic atrial fibrillation, unspecified: Secondary | ICD-10-CM

## 2019-01-16 DIAGNOSIS — Z79899 Other long term (current) drug therapy: Secondary | ICD-10-CM | POA: Diagnosis not present

## 2019-01-16 DIAGNOSIS — I083 Combined rheumatic disorders of mitral, aortic and tricuspid valves: Secondary | ICD-10-CM | POA: Diagnosis not present

## 2019-01-16 DIAGNOSIS — G629 Polyneuropathy, unspecified: Secondary | ICD-10-CM | POA: Insufficient documentation

## 2019-01-16 DIAGNOSIS — Z8249 Family history of ischemic heart disease and other diseases of the circulatory system: Secondary | ICD-10-CM | POA: Insufficient documentation

## 2019-01-16 DIAGNOSIS — Z87891 Personal history of nicotine dependence: Secondary | ICD-10-CM | POA: Insufficient documentation

## 2019-01-16 DIAGNOSIS — Z01810 Encounter for preprocedural cardiovascular examination: Secondary | ICD-10-CM

## 2019-01-16 HISTORY — PX: RIGHT/LEFT HEART CATH AND CORONARY ANGIOGRAPHY: CATH118266

## 2019-01-16 HISTORY — PX: TEE WITHOUT CARDIOVERSION: SHX5443

## 2019-01-16 LAB — POCT I-STAT 7, (LYTES, BLD GAS, ICA,H+H)
Acid-Base Excess: 1 mmol/L (ref 0.0–2.0)
Bicarbonate: 25.9 mmol/L (ref 20.0–28.0)
CALCIUM ION: 1.15 mmol/L (ref 1.15–1.40)
HEMATOCRIT: 37 % (ref 36.0–46.0)
Hemoglobin: 12.6 g/dL (ref 12.0–15.0)
O2 Saturation: 95 %
POTASSIUM: 3.5 mmol/L (ref 3.5–5.1)
Sodium: 137 mmol/L (ref 135–145)
TCO2: 27 mmol/L (ref 22–32)
pCO2 arterial: 40.2 mmHg (ref 32.0–48.0)
pH, Arterial: 7.418 (ref 7.350–7.450)
pO2, Arterial: 77 mmHg — ABNORMAL LOW (ref 83.0–108.0)

## 2019-01-16 LAB — POCT I-STAT EG7
Acid-Base Excess: 1 mmol/L (ref 0.0–2.0)
Acid-Base Excess: 2 mmol/L (ref 0.0–2.0)
BICARBONATE: 26.2 mmol/L (ref 20.0–28.0)
Bicarbonate: 27.7 mmol/L (ref 20.0–28.0)
Calcium, Ion: 1.05 mmol/L — ABNORMAL LOW (ref 1.15–1.40)
Calcium, Ion: 1.15 mmol/L (ref 1.15–1.40)
HCT: 38 % (ref 36.0–46.0)
HEMATOCRIT: 35 % — AB (ref 36.0–46.0)
Hemoglobin: 11.9 g/dL — ABNORMAL LOW (ref 12.0–15.0)
Hemoglobin: 12.9 g/dL (ref 12.0–15.0)
O2 Saturation: 69 %
O2 Saturation: 69 %
Potassium: 3.2 mmol/L — ABNORMAL LOW (ref 3.5–5.1)
Potassium: 3.4 mmol/L — ABNORMAL LOW (ref 3.5–5.1)
SODIUM: 138 mmol/L (ref 135–145)
Sodium: 140 mmol/L (ref 135–145)
TCO2: 28 mmol/L (ref 22–32)
TCO2: 29 mmol/L (ref 22–32)
pCO2, Ven: 43.1 mmHg — ABNORMAL LOW (ref 44.0–60.0)
pCO2, Ven: 45.8 mmHg (ref 44.0–60.0)
pH, Ven: 7.39 (ref 7.250–7.430)
pH, Ven: 7.393 (ref 7.250–7.430)
pO2, Ven: 36 mmHg (ref 32.0–45.0)
pO2, Ven: 36 mmHg (ref 32.0–45.0)

## 2019-01-16 LAB — PROTIME-INR
INR: 1.71
Prothrombin Time: 19.8 seconds — ABNORMAL HIGH (ref 11.4–15.2)

## 2019-01-16 SURGERY — RIGHT/LEFT HEART CATH AND CORONARY ANGIOGRAPHY
Anesthesia: LOCAL

## 2019-01-16 SURGERY — ECHOCARDIOGRAM, TRANSESOPHAGEAL
Anesthesia: Moderate Sedation

## 2019-01-16 MED ORDER — SODIUM CHLORIDE 0.9 % WEIGHT BASED INFUSION: 1.0000 mL/kg/h | INTRAVENOUS | Status: DC

## 2019-01-16 MED ORDER — ONDANSETRON HCL 4 MG/2ML IJ SOLN: 4.0000 mg | Freq: Four times a day (QID) | INTRAMUSCULAR | Status: DC | PRN

## 2019-01-16 MED ORDER — HEPARIN SODIUM (PORCINE) 1000 UNIT/ML IJ SOLN
INTRAMUSCULAR | Status: DC | PRN
  Administered 2019-01-16: 3000 [IU] via INTRAVENOUS

## 2019-01-16 MED ORDER — MIDAZOLAM HCL (PF) 5 MG/ML IJ SOLN
INTRAMUSCULAR | Status: AC
  Filled 2019-01-16: qty 2

## 2019-01-16 MED ORDER — SODIUM CHLORIDE 0.9% FLUSH: 3.0000 mL | Freq: Two times a day (BID) | INTRAVENOUS | Status: DC

## 2019-01-16 MED ORDER — SODIUM CHLORIDE 0.9 % WEIGHT BASED INFUSION: 3.0000 mL/kg/h | INTRAVENOUS | Status: AC

## 2019-01-16 MED ORDER — ACETAMINOPHEN 325 MG PO TABS: 650.0000 mg | ORAL_TABLET | ORAL | Status: DC | PRN

## 2019-01-16 MED ORDER — LIDOCAINE HCL (PF) 1 % IJ SOLN
INTRAMUSCULAR | Status: DC | PRN
  Administered 2019-01-16 (×2): 2 mL

## 2019-01-16 MED ORDER — MIDAZOLAM HCL 2 MG/2ML IJ SOLN
INTRAMUSCULAR | Status: AC
  Filled 2019-01-16: qty 2

## 2019-01-16 MED ORDER — LIDOCAINE HCL (PF) 1 % IJ SOLN
INTRAMUSCULAR | Status: AC
  Filled 2019-01-16: qty 30

## 2019-01-16 MED ORDER — HEPARIN (PORCINE) IN NACL 1000-0.9 UT/500ML-% IV SOLN
INTRAVENOUS | Status: AC
  Filled 2019-01-16: qty 1000

## 2019-01-16 MED ORDER — VERAPAMIL HCL 2.5 MG/ML IV SOLN
INTRAVENOUS | Status: AC
  Filled 2019-01-16: qty 2

## 2019-01-16 MED ORDER — IOHEXOL 350 MG/ML SOLN
INTRAVENOUS | Status: DC | PRN
  Administered 2019-01-16: 30 mL via INTRACARDIAC

## 2019-01-16 MED ORDER — SODIUM CHLORIDE 0.9% FLUSH: 3.0000 mL | INTRAVENOUS | Status: DC | PRN

## 2019-01-16 MED ORDER — SODIUM CHLORIDE 0.9 % IV SOLN: INTRAVENOUS | Status: AC

## 2019-01-16 MED ORDER — HEPARIN SODIUM (PORCINE) 1000 UNIT/ML IJ SOLN
INTRAMUSCULAR | Status: AC
  Filled 2019-01-16: qty 1

## 2019-01-16 MED ORDER — FENTANYL CITRATE (PF) 100 MCG/2ML IJ SOLN
INTRAMUSCULAR | Status: DC | PRN
  Administered 2019-01-16: 25 ug via INTRAVENOUS
  Administered 2019-01-16: 12.5 ug via INTRAVENOUS

## 2019-01-16 MED ORDER — VERAPAMIL HCL 2.5 MG/ML IV SOLN
INTRAVENOUS | Status: DC | PRN
  Administered 2019-01-16: 10 mL via INTRA_ARTERIAL

## 2019-01-16 MED ORDER — FENTANYL CITRATE (PF) 100 MCG/2ML IJ SOLN
INTRAMUSCULAR | Status: AC
  Filled 2019-01-16: qty 2

## 2019-01-16 MED ORDER — SODIUM CHLORIDE 0.9 % IV SOLN: 250.0000 mL | INTRAVENOUS | Status: DC | PRN

## 2019-01-16 MED ORDER — MIDAZOLAM HCL 2 MG/2ML IJ SOLN
INTRAMUSCULAR | Status: DC | PRN
  Administered 2019-01-16: 1 mg via INTRAVENOUS

## 2019-01-16 MED ORDER — MIDAZOLAM HCL (PF) 10 MG/2ML IJ SOLN
INTRAMUSCULAR | Status: DC | PRN
  Administered 2019-01-16: 1 mg via INTRAVENOUS
  Administered 2019-01-16: 2 mg via INTRAVENOUS

## 2019-01-16 MED ORDER — HEPARIN (PORCINE) IN NACL 1000-0.9 UT/500ML-% IV SOLN
INTRAVENOUS | Status: DC | PRN
  Administered 2019-01-16 (×2): 500 mL

## 2019-01-16 MED ORDER — BUTAMBEN-TETRACAINE-BENZOCAINE 2-2-14 % EX AERO
INHALATION_SPRAY | CUTANEOUS | Status: DC | PRN
  Administered 2019-01-16: 2 via TOPICAL

## 2019-01-16 MED ORDER — SODIUM CHLORIDE 0.9 % IV SOLN
INTRAVENOUS | Status: DC
  Administered 2019-01-16: 500 mL via INTRAVENOUS

## 2019-01-16 SURGICAL SUPPLY — 12 items
CATH BALLN WEDGE 5F 110CM (CATHETERS) ×1 IMPLANT
CATH INFINITI 5FR ANG PIGTAIL (CATHETERS) ×1 IMPLANT
CATH OPTITORQUE TIG 4.0 5F (CATHETERS) ×1 IMPLANT
DEVICE RAD COMP TR BAND LRG (VASCULAR PRODUCTS) ×1 IMPLANT
GLIDESHEATH SLEND A-KIT 6F 22G (SHEATH) ×1 IMPLANT
GUIDEWIRE INQWIRE 1.5J.035X260 (WIRE) IMPLANT
INQWIRE 1.5J .035X260CM (WIRE) ×2
KIT HEART LEFT (KITS) ×2 IMPLANT
PACK CARDIAC CATHETERIZATION (CUSTOM PROCEDURE TRAY) ×2 IMPLANT
SHEATH GLIDE SLENDER 4/5FR (SHEATH) ×1 IMPLANT
TRANSDUCER W/STOPCOCK (MISCELLANEOUS) ×2 IMPLANT
TUBING CIL FLEX 10 FLL-RA (TUBING) ×2 IMPLANT

## 2019-01-16 NOTE — Progress Notes (Signed)
  Echocardiogram Echocardiogram Transesophageal has been performed.  Adriana Hendricks 01/16/2019, 9:01 AM

## 2019-01-16 NOTE — CV Procedure (Signed)
    PROCEDURE NOTE:  Procedure:  Transesophageal echocardiogram Operator:  Fransico Him, MD Indications:  Tricuspid and mitral regurgitation Complications: None  During this procedure the patient is administered a total of Versed 3 mg and Fentanyl 37.5 mg to achieve and maintain moderate conscious sedation.  The patient's heart rate, blood pressure, and oxygen saturation are monitored continuously during the procedure. The period of conscious sedation is 19 minutes, of which I was present face-to-face 100% of this time.  Results: Normal LV size and function Normal RV size and function Severely dilated RA Severely dilated LA with mild spontaneous echo contrast.  Normal LA appendage with no evidence of thrombus. Poor coaptation of the TV leaflets due to annular dilatation with severe TR. Normal PV Mildly thickened MV leaflets with mildly reduced motion of the posterior MV leaflet with mild to moderate central MR. Bicuspid AV with mildly thickened leaflets and trivial AR Lipomatous hypertrophy of the interatrial septum with no evidence of shunt by colorflow dopper  Normal thoracic and ascending aorta.  The patient tolerated the procedure well and was transferred back to their room in stable condition.  Signed: Fransico Him, MD [facility]

## 2019-01-16 NOTE — Interval H&P Note (Signed)
History and Physical Interval Note:  01/16/2019 10:16 AM  Adriana Hendricks  has presented today for surgery, with the diagnosis of severe tricuspid regurgitation, exertional dyspnea. Potential preop for tricuspid repair.    The various methods of treatment have been discussed with the patient and family. After consideration of risks, benefits and other options for treatment, the patient has consented to  Procedure(s): RIGHT/LEFT HEART CATH AND CORONARY ANGIOGRAPHY (N/A) with possible PERCUTANEOUS CORONARY INTERVENTION as a surgical intervention .  The patient's history has been reviewed, patient examined, no change in status, stable for surgery.  I have reviewed the patient's chart and labs.  Questions were answered to the patient's satisfaction.    Cath Lab Visit (complete for each Cath Lab visit)  Clinical Evaluation Leading to the Procedure:   ACS: No.  Non-ACS:    Anginal Classification: CCS III -exertional dyspnea  Anti-ischemic medical therapy: Minimal Therapy (1 class of medications)  Non-Invasive Test Results: Low-risk stress test findings: cardiac mortality <1%/year; however she has persistent symptoms of dyspnea despite negative Myoview.  Also potentially preop  Prior CABG: No previous CABG   Glenetta Hew

## 2019-01-16 NOTE — Interval H&P Note (Signed)
History and Physical Interval Note:  01/16/2019 8:10 AM  Adriana Hendricks  has presented today for surgery, with the diagnosis of MR  The various methods of treatment have been discussed with the patient and family. After consideration of risks, benefits and other options for treatment, the patient has consented to  Procedure(s): TRANSESOPHAGEAL ECHOCARDIOGRAM (TEE) (N/A) as a surgical intervention .  The patient's history has been reviewed, patient examined, no change in status, stable for surgery.  I have reviewed the patient's chart and labs.  Questions were answered to the patient's satisfaction.     Fransico Him

## 2019-01-16 NOTE — Discharge Instructions (Signed)
TEE  YOU HAD AN CARDIAC PROCEDURE TODAY: Refer to the procedure report and other information in the discharge instructions given to you for any specific questions about what was found during the examination. If this information does not answer your questions, please call [facility] office at [phone] to clarify.   DIET: Your first meal following the procedure should be a light meal and then it is ok to progress to your normal diet. A half-sandwich or bowl of soup is an example of a good first meal. Heavy or fried foods are harder to digest and may make you feel nauseous or bloated. Drink plenty of fluids but you should avoid alcoholic beverages for 24 hours. If you had a esophageal dilation, please see attached instructions for diet.   ACTIVITY: Your care partner should take you home directly after the procedure. You should plan to take it easy, moving slowly for the rest of the day. You can resume normal activity the day after the procedure however YOU SHOULD NOT DRIVE, use power tools, machinery or perform tasks that involve climbing or major physical exertion for 24 hours (because of the sedation medicines used during the test).   SYMPTOMS TO REPORT IMMEDIATELY: A cardiologist can be reached at any hour. Please call [phone] for any of the following symptoms:  Vomiting of blood or coffee ground material  New, significant abdominal pain  New, significant chest pain or pain under the shoulder blades  Painful or persistently difficult swallowing  New shortness of breath  Black, tarry-looking or red, bloody stools  FOLLOW UP:  Please also call with any specific questions about appointments or follow up tests.   Radial Site Care//RESTART COUMADIN 5MG  TONIGHT     This sheet gives you information about how to care for yourself after your procedure. Your health care provider may also give you more specific instructions. If you have problems or questions, contact your health care  provider. What can I expect after the procedure? After the procedure, it is common to have:  Bruising and tenderness at the catheter insertion area. Follow these instructions at home: Medicines  Take over-the-counter and prescription medicines only as told by your health care provider. Insertion site care  Follow instructions from your health care provider about how to take care of your insertion site. Make sure you: ? Wash your hands with soap and water before you change your bandage (dressing). If soap and water are not available, use hand sanitizer. ? Change your dressing as told by your health care provider. ? Leave stitches (sutures), skin glue, or adhesive strips in place. These skin closures may need to stay in place for 2 weeks or longer. If adhesive strip edges start to loosen and curl up, you may trim the loose edges. Do not remove adhesive strips completely unless your health care provider tells you to do that.  Check your insertion site every day for signs of infection. Check for: ? Redness, swelling, or pain. ? Fluid or blood. ? Pus or a bad smell. ? Warmth.  Do not take baths, swim, or use a hot tub until your health care provider approves.  You may shower 24-48 hours after the procedure, or as directed by your health care provider. ? Remove the dressing and gently wash the site with plain soap and water. ? Pat the area dry with a clean towel. ? Do not rub the site. That could cause bleeding.  Do not apply powder or lotion to the site. Activity  For 24 hours after the procedure, or as directed by your health care provider: ? Do not flex or bend the affected arm. ? Do not push or pull heavy objects with the affected arm. ? Do not drive yourself home from the hospital or clinic. You may drive 24 hours after the procedure unless your health care provider tells you not to. ? Do not operate machinery or power tools.  Do not lift anything that is heavier than 10 lb  (4.5 kg), or the limit that you are told, until your health care provider says that it is safe.  Ask your health care provider when it is okay to: ? Return to work or school. ? Resume usual physical activities or sports. ? Resume sexual activity. General instructions  If the catheter site starts to bleed, raise your arm and put firm pressure on the site. If the bleeding does not stop, get help right away. This is a medical emergency.  If you went home on the same day as your procedure, a responsible adult should be with you for the first 24 hours after you arrive home.  Keep all follow-up visits as told by your health care provider. This is important. Contact a health care provider if:  You have a fever.  You have redness, swelling, or yellow drainage around your insertion site. Get help right away if:  You have unusual pain at the radial site.  The catheter insertion area swells very fast.  The insertion area is bleeding, and the bleeding does not stop when you hold steady pressure on the area.  Your arm or hand becomes pale, cool, tingly, or numb. These symptoms may represent a serious problem that is an emergency. Do not wait to see if the symptoms will go away. Get medical help right away. Call your local emergency services (911 in the [place] Do not drive yourself to the hospital. Summary  After the procedure, it is common to have bruising and tenderness at the site.  Follow instructions from your health care provider about how to take care of your radial site wound. Check the wound every day for signs of infection.  Do not lift anything that is heavier than 10 lb (4.5 kg), or the limit that you are told, until your health care provider says that it is safe. This information is not intended to replace advice given to you by your health care provider. Make sure you discuss any questions you have with your health care provider. Document Released: 01/14/2011 Document Revised:  01/17/2018 Document Reviewed: 01/17/2018 [employer]vier Interactive Patient Education  2019 [employer].

## 2019-01-17 ENCOUNTER — Encounter (HOSPITAL_COMMUNITY): Payer: Self-pay | Admitting: Cardiology

## 2019-01-21 ENCOUNTER — Encounter (HOSPITAL_COMMUNITY): Payer: Self-pay | Admitting: Cardiology

## 2019-01-28 ENCOUNTER — Other Ambulatory Visit: Payer: Self-pay | Admitting: *Deleted

## 2019-01-28 ENCOUNTER — Other Ambulatory Visit: Payer: Self-pay

## 2019-01-28 ENCOUNTER — Ambulatory Visit (INDEPENDENT_AMBULATORY_CARE_PROVIDER_SITE_OTHER): Payer: Medicare Other | Admitting: Thoracic Surgery (Cardiothoracic Vascular Surgery)

## 2019-01-28 ENCOUNTER — Encounter: Payer: Self-pay | Admitting: Thoracic Surgery (Cardiothoracic Vascular Surgery)

## 2019-01-28 VITALS — BP 128/74 | HR 78 | Resp 16 | Ht 65.5 in | Wt 170.0 lb

## 2019-01-28 DIAGNOSIS — I340 Nonrheumatic mitral (valve) insufficiency: Secondary | ICD-10-CM | POA: Diagnosis not present

## 2019-01-28 DIAGNOSIS — I051 Rheumatic mitral insufficiency: Secondary | ICD-10-CM | POA: Insufficient documentation

## 2019-01-28 DIAGNOSIS — I071 Rheumatic tricuspid insufficiency: Secondary | ICD-10-CM

## 2019-01-28 DIAGNOSIS — Z7901 Long term (current) use of anticoagulants: Secondary | ICD-10-CM

## 2019-01-28 DIAGNOSIS — Q2381 Bicuspid aortic valve: Secondary | ICD-10-CM | POA: Insufficient documentation

## 2019-01-28 DIAGNOSIS — I4891 Unspecified atrial fibrillation: Secondary | ICD-10-CM

## 2019-01-28 DIAGNOSIS — Q231 Congenital insufficiency of aortic valve: Secondary | ICD-10-CM

## 2019-01-28 DIAGNOSIS — I4819 Other persistent atrial fibrillation: Secondary | ICD-10-CM | POA: Diagnosis not present

## 2019-01-28 DIAGNOSIS — I2510 Atherosclerotic heart disease of native coronary artery without angina pectoris: Secondary | ICD-10-CM

## 2019-01-28 NOTE — Patient Instructions (Signed)
Continue all previous medications without any changes at this time  

## 2019-01-28 NOTE — Progress Notes (Signed)
[address]       [place] [address]             [phone]     CARDIOTHORACIC SURGERY OFFICE NOTE  Referring Provider is Leonie Man, MD PCP is Marin Olp, MD   HPI:  Patient is an 84 year old female with history of hypertension and obstructive sleep apnea on home CPAP who returns to the office today to further discuss treatment options for management of persistent atrial fibrillation that has failed an attempt at DC cardioversion and severe tricuspid regurgitation.  She was initially seen in consultation on January 07, 2019.  Since then she underwent TEE and diagnostic cardiac catheterization on January 16, 2019.  TEE confirmed the presence of severe tricuspid regurgitation and revealed moderate mitral regurgitation.  There was severe left and right atrial enlargement.  Left ventricular systolic function appeared normal.  Right ventricular size and function were reported normal.  Diagnostic cardiac catheterization was notable for the absence of significant coronary artery disease.  Right heart pressures were normal.  The patient returns to the office today for follow-up with hopes to plan elective surgical intervention in the near future.  She describes stable symptoms of exertional shortness of breath and fatigue.  She is significantly limited by exertional shortness of breath.  She denies any chest pain or chest tightness either with activity or at rest.  Review of systems remains unchanged from her previous office visit.   Current Outpatient Medications  Medication Sig Dispense Refill  . calcium carbonate (TUMS - DOSED IN MG ELEMENTAL CALCIUM) 500 MG chewable tablet Chew 1 tablet by mouth daily.     . chlorthalidone (HYGROTON) 25 MG tablet Take 1 tablet (25 mg total) by mouth daily. 30 tablet 6  . cholecalciferol (VITAMIN D3) 25 MCG (1000 UT) tablet Take 1,000 Units by mouth daily.    . famotidine (PEPCID) 20 MG tablet Take 20 mg by mouth at bedtime.     . Multiple Vitamins-Minerals (ICAPS AREDS 2) CAPS Take 2 capsules by mouth daily.     . nadolol (CORGARD) 40 MG tablet Take 0.5 tablets (20 mg total) by mouth daily. (Patient taking differently: Take 40 mg by mouth daily. ) 903 tablet 3  . Omega-3 Fatty Acids (FISH OIL) 1200 MG CAPS Take 1,200 mg by mouth daily.    Marland Kitchen warfarin (COUMADIN) 2.5 MG tablet Take 2.5 mg by mouth every Monday,Wednesday,Friday, and _[id number] Study Date: 01/16/2019 Gender:     F Age:  83 Height:     166.4 cm Weight:     77.1 kg BSA:        1.91 m^2 Pt. Status: Room:   PERFORMING   Fransico Him, MD  Criss Rosales  ADMITTING    Glenetta Hew, MD  ATTENDING    Glenetta Hew, MD  SONOGRAPHER  Johny Chess, RDCS, CCT  cc:  ------------------------------------------------------------------- LV EF: 55% -   60%  ------------------------------------------------------------------- Indications:      Tricuspid insufficiency 424.2. Severe valvular disease  ------------------------------------------------------------------- Study Conclusions  - Left ventricle: Systolic function was normal. The estimated   ejection fraction was in  the range of 55% to 60%. Wall motion was   normal; there were no regional wall motion abnormalities. - Aortic valve: Bicuspid; mildly thickened leaflets. There was   trivial regurgitation. - Mitral valve: Mobility of the posterior leaflet was mildly   restricted. There was mild to moderate regurgitation directed   centrally. - Left atrium: The atrium was moderately to severely dilated. No   evidence of thrombus in the atrial cavity or appendage. There was   spontaneous echo contrast (&quot;smoke&quot;). - Right atrium: The atrium was severely dilated. No evidence of   thrombus in the atrial cavity or appendage. - Atrial septum: There was increased thickness of the septum,   consistent with lipomatous hypertrophy. - Tricuspid valve: Poor coaptation of the TV leaflets due to   annular dilatation. There is severe TR.  ------------------------------------------------------------------- Study data:   Study status:  Routine.  Consent:  The risks, benefits, and alternatives to the procedure were explained to the patient and informed consent was obtained.  Procedure:  Initial setup. The patient was brought to the laboratory. Surface ECG leads were monitored. Sedation. Conscious sedation was administered by cardiology staff. Transesophageal echocardiography. A 3rd probe transesophageal probe was inserted by the attending cardiologistwithout difficulty. Image quality was adequate.  Study completion:  The patient tolerated the procedure well. There were no complications.  Administered medications:   Midazolam, '3mg'$ , IV. Fentanyl, 37.34mg, IV.          Diagnostic transesophageal echocardiography.  2D and color Doppler.  Birthdate:  Patient birthdate: [birth year]  Age:  Patient is 84yr old.  Sex:  Gender: female.    BMI: 27.8 kg/m^2.  Blood pressure:     143/94  Patient status:  Outpatient.  Study date:  Study date: 01/16/2019. Study time: 07:50 AM.  Location:   Endoscopy.  -------------------------------------------------------------------  ------------------------------------------------------------------- Left ventricle:  Systolic function was normal. The estimated ejection fraction was in the range of 55% to 60%. Wall motion was normal; there were no regional wall motion abnormalities.  ------------------------------------------------------------------- Aortic valve:   Bicuspid; mildly thickened leaflets. Cusp separation was normal.  Doppler:  There was trivial regurgitation.   ------------------------------------------------------------------- Aorta:  There was no atheroma. There was no evidence for dissection. Aortic root: The aortic root was not dilated. Ascending aorta: The ascending aorta was normal in size. Aortic arch: The aortic arch was normal in size. Descending aorta: The descending aorta was normal in size.  ------------------------------------------------------------------- Mitral valve:   Mildly thickened leaflets . Leaflet separation was normal. Mobility of the posterior leaflet was mildly restricted. Doppler:  There was mild to moderate regurgitation directed centrally.  ------------------------------------------------------------------- Left atrium:  The atrium was moderately to severely dilated.  No evidence of thrombus in the atrial cavity or appendage. There was spontaneous echo contrast (&quot;smoke&quot;). The appendage was morphologically a left appendage, multilobulated, and of  normal size.  ------------------------------------------------------------------- Atrial septum:  There was increased thickness of the septum, consistent with lipomatous hypertrophy.  ------------------------------------------------------------------- Right ventricle:  The cavity size was normal. Wall thickness was normal. Systolic function was  normal.  ------------------------------------------------------------------- Pulmonic valve:    Structurally normal valve.  ------------------------------------------------------------------- Tricuspid valve:  Poor coaptation of the TV leaflets due to annular dilatation. There is severe TR.  ------------------------------------------------------------------- Pulmonary artery:   The main pulmonary artery was normal-sized.  ------------------------------------------------------------------- Right atrium:  The atrium was severely dilated.  No evidence of thrombus in the atrial cavity or appendage. The appendage was morphologically a right appendage.  ------------------------------------------------------------------- Pericardium:  There was no pericardial effusion.  ------------------------------------------------------------------- Measurements   Mitral valve                              Value  Mitral maximal regurg velocity, PISA      538   cm/s  Mitral regurg VTI, PISA                   178   cm  Mitral ERO, PISA                          0.04  cm^2  Mitral regurg volume, PISA                7     ml  Legend: (L)  and  (H)  mark values outside specified reference range.  ------------------------------------------------------------------- Prepared and Electronically Authenticated by  Fransico Him, MD 2020-01-23T14:27:30    RIGHT/LEFT HEART CATH AND CORONARY ANGIOGRAPHY  Conclusion     Angiographically normal coronary arteries  LV end diastolic pressure is normal.  No evidence pulmonary pretension   SUMMARY  Angiographically minimal CAD.  Strikingly normal RIGHT HEART CATH PRESSURES with only high normal mean PA pressure of 23 mmHg in the setting of PCWP pressure of 18 mmHg.  Basically euvolemic with LVEDP of 7 mmHg. -With mean PA pressure of 23 mmHg there is a transpulmonary gradient suggesting a primary pulmonary  etiology.   RECOMMENDATIONS  We will defer to Dr. Roxy Manns reference potential management of tricuspid valve insufficiency based on TEE with relative normal right heart cath numbers.  Would recommend treating potential pulmonary etiology.  Otherwise would continue current cardiac medications    Follow-up as scheduled.  Glenetta Hew, MD    Recommendations   Antiplatelet/Anticoag No indication for antiplatelet therapy at this time . Resume Warfarin tonight.  Surgeon Notes     01/16/2019 8:41 AM CV Procedure signed by Sueanne Margarita, MD  Indications   Nonrheumatic tricuspid valve regurgitation [I36.1 (ICD-10-CM)]  DOE (dyspnea on exertion) [R06.09 (ICD-10-CM)]  Procedural Details   Technical Details PCP: Marin Olp, MD  Cardiologist: Dr. [name], Beckie Busing, NP;  EP: [name], NP in the Afib clinic.  CT surgeon: Dr. Roxy Manns  Mrs. Chavero is a very pleasant 84 year old woman with chronic persistent atrial fibrillation (unsuccessful rhythm control/cardioversion) who has been having significant exertional dyspnea. Echocardiographically she was found to have severe tricuspid regurgitation, but no other signs and symptoms of cor pulmonale. She was referred to Dr. Roxy Manns to consider Maze procedure plus tricuspid repair. Dr. Roxy Manns was not convinced that this would be appropriate therapy without first evaluating for potential ischemic etiology and/or pulmonary pretension. She was therefore Scheduled for Right and Left Heart Cath and TEE today after being seen by Curt Bears  Purcell Nails, NP.  She Has Had Her TEE, and Is Now Presenting for Her Right and Left Heart Cath. She has not had any chest pain or pressure. She remains in A. fib. Warfarin has been on hold for the procedure.  Time Out: Verified patient identification, verified procedure, site/side was marked, verified correct patient position, special equipment/implants available, medications/allergies/relevent history  reviewed, required imaging and test results available. Performed.   Access:  Right radial Artery: 6 Fr sheath -- Seldinger technique using Angiocath Micropuncture Kit * Direct ultrasound guidance used. Permanent image obtained and placed on chart.  *10 mL radial cocktail IA; 4000 Units IV Heparin  * Right Brachial/Antecubital Vein: The existing 18-gauge IV was exchanged over a wire for a 5Fr sheath  Right Heart Catheterization: 5 Fr Gordy Councilman catheter advanced under fluoroscopy with balloon inflated to the RA, RV, then PCWP-PA for hemodynamic measurement.  * Simultaneous FA & PA blood gases checked for SaO2% to calculate FICK CO/CI  * The Catheter removed completely out of the body with balloon deflated.  Left Heart Catheterization: 5Fr Catheters were advanced or exchanged over a J-wire under direct fluoroscopic guidance into the ascending aorta; TIG 4.0 catheter advanced first.  * LV Hemodynamics (LV Gram): Angled Pigtail Catheter * Left & Right Coronary Artery Cineangiography: TIG 4.0 Catheter   Upon completion of Angiogaphy, the catheter was removed completely out of the body over a wire, without complication.  Brachial Sheath(s) removed in the post procedure unit with manual pressure for hemostasis.   Radial sheath removed in the Cardiac Catheterization lab with TR Band placed for hemostasis.  TR Band: 1110 Hours; 11 mL air  MEDICATIONS * SQ Lidocaine 49m * Radial Cocktail: 3 mg Verapmil in 10 mL NS * Isovue Contrast: 30 * Heparin: 4000 Units  Fluoro time: 5.8 minutes. Dose Area Product: 5.6 mGycm2. Cumulative Air Kerma: 104.6 mGy. Physician was notified of the radiation values and acknowledged them during the procedure.  Estimated blood loss <50 mL.   During this procedure medications were administered to achieve and maintain moderate conscious sedation while the patient's heart rate, blood pressure, and oxygen saturation were continuously monitored and I was present  face-to-face 100% of this time.  Medications  (Filter: Administrations occurring from 01/16/19 1013 to 01/16/19 1123)  Medication Rate/Dose/Volume Action  Date Time   midazolam (VERSED) injection (mg) 1 mg Given 01/16/19 1033   Total dose as of 01/28/19 1048        1 mg        Heparin (Porcine) in NaCl 1000-0.9 UT/500ML-% SOLN (mL) 500 mL Given 01/16/19 1033   Total dose as of 01/28/19 1048 500 mL Given 1034   1,000 mL        lidocaine (PF) (XYLOCAINE) 1 % injection (mL) 2 mL Given 01/16/19 1037   Total dose as of 01/28/19 1048 2 mL Given 1038   4 mL        Radial Cocktail/Verapamil only (mL) 10 mL Given 01/16/19 1039   Total dose as of 01/28/19 1048        10 mL        heparin injection (Units) 3,000 Units Given 01/16/19 1057   Total dose as of 01/28/19 1048        3,000 Units        iohexol (OMNIPAQUE) 350 MG/ML injection (mL) 30 mL Given 01/16/19 1114   Total dose as of 01/28/19 1048        30 mL  Sedation Time   Sedation Time Physician-1: 32 minutes 31 seconds  Complications   Complications documented before study signed (01/16/2019 2:03 PM EST)    No complications were associated with this study.  Documented by Leonie Man, MD - 01/16/2019 11:23 AM EST    Coronary Findings   Diagnostic  Dominance: Right  Left Main  Vessel was injected. Vessel is large. Vessel is angiographically normal.  Left Anterior Descending  Vessel was injected. Vessel is normal in caliber and large. Vessel is angiographically normal.  First Diagonal Branch  Vessel is moderate in size.  First Septal Branch  Vessel is small in size.  Second Diagonal Branch  Vessel is small in size.  Second Septal Branch  Vessel is small in size.  Third Septal Branch  Vessel is small in size.  Ramus Intermedius  Vessel was injected. Vessel is moderate in size. Vessel is angiographically normal.  Left Circumflex  Vessel was injected. Vessel is normal in caliber and large. Vessel is  angiographically normal.  First Obtuse Marginal Branch  Vessel is large in size.  Left Atrioventricular Groove Continuation  Vessel is small in size.  Right Coronary Artery  Vessel was injected. Vessel is normal in caliber and large. Vessel is angiographically normal.  Right Posterior Descending Artery  Vessel is moderate in size.  Right Posterior Atrioventricular Branch  Vessel is moderate in size.  First Right Posterolateral  Vessel is small in size.  Second Right Posterolateral  Vessel is small in size.  Intervention   No interventions have been documented.  Right Heart   Right Heart Pressures PA P 34/14 mmHg. Mean PA P 23 mmHg PCWP 18 mmHg LV EDP is normal. LVP-EDP: 112/1 mmHg-6 mmHg Aortic pressure 119/65 mmHg. MAP 86 mmHg  SaO2 95%. PaO2 69%. Cardiac Output-Index by FICK: 5.02-2.71  Right Atrium The right atrium is severely dilated. RA pressure 7 mmHg. Peak pressure of 13 mmHg. (Upper normal)  Right Ventricle RVP-EDP: 31/1 mmHg - 5 mmHg  Wall Motion   Resting    No LV Gram        Left Heart   Left Ventricle LV end diastolic pressure is normal.  Coronary Diagrams   Diagnostic  Dominance: Right    Intervention   Implants    No implant documentation for this case.  Syngo Images   Show images for CARDIAC CATHETERIZATION  MERGE Images   Show images for CARDIAC CATHETERIZATION   Link to Procedure Log   Procedure Log    Hemo Data    Most Recent Value  Fick Cardiac Output 5.02 L/min  Fick Cardiac Output Index 2.71 (L/min)/BSA  RA A Wave 6 mmHg  RA V Wave 13 mmHg  RA Mean 7 mmHg  RV Systolic Pressure 31 mmHg  RV Diastolic Pressure -5 mmHg  RV EDP 5 mmHg  PA Systolic Pressure 34 mmHg  PA Diastolic Pressure 14 mmHg  PA Mean 23 mmHg  PW A Wave 15 mmHg  PW V Wave 25 mmHg  PW Mean 18 mmHg  AO Systolic Pressure 867 mmHg  AO Diastolic Pressure 70 mmHg  AO Mean 91 mmHg  LV Systolic Pressure 672 mmHg  LV Diastolic Pressure 1 mmHg  LV EDP 6 mmHg   AOp Systolic Pressure 094 mmHg  AOp Diastolic Pressure 65 mmHg  AOp Mean Pressure 86 mmHg  LVp Systolic Pressure 709 mmHg  LVp Diastolic Pressure 1 mmHg  LVp EDP Pressure 7 mmHg  QP/QS 1  TPVR Index 8.5 HRUI  TSVR  Index 33.65 HRUI  PVR SVR Ratio 0.06  TPVR/TSVR Ratio 0.25     Impression:  Patient has severe tricuspid regurgitation and persistent atrial fibrillation that has failed an attempt at DC cardioversion.  She describes a history consistent with a long gradual progression of symptoms of exertional shortness of breath and fatigue.  For the last several months the patient complains that she gets short of breath with low-level activity, consistent with symptoms of chronic diastolic congestive heart failure, New York Heart Association functional class III.  At least some of the patient's symptoms of exertional shortness of breath may be related to physical deconditioning and decreased activity.  She denies symptoms of lower extremity edema or abdominal bloating to suggest the presence of significant right-sided heart failure.  She does not experience any exertional chest pain or chest tightness, and recent nuclear stress testing was felt to be low risk.  I have personally reviewed the patient's recent transesophageal echocardiogram and diagnostic cardiac catheterization.  The patient has normal left ventricular size and systolic function.  There is severe left atrial enlargement and mild to moderate mitral regurgitation.    There is annular dilatation with normal leaflet mobility.  The leaflets are mildly thickened with myxomatous degenerative change.  The aortic valve is bicuspid.  There is no significant aortic stenosis or aortic insufficiency.  The leaflets are thin and move well.  There is severe tricuspid regurgitation.  Right ventricular size and function appear essentially normal.    The right atrium is severely dilated and there is severe annular enlargement of the tricuspid annulus.   There appears to be normal leaflet mobility of the tricuspid valve with mild myxomatous change and no prolapse.  There does not appear to be any significant left ventricular septal displacement or pulmonary hypertension.  Diagnostic cardiac catheterization is notable for the absence of significant coronary artery disease.  Right heart pressures and cardiac output were normal.  Options include continued medical therapy with long-term anticoagulation and rate control for management of atrial fibrillation with careful attention to medical therapy for congestive heart failure.  Catheter-based ablation could be attempted, but I agree that the long-term likelihood of success would probably be relatively low because of the patient's advanced age, severe left atrial enlargement, and severe tricuspid regurgitation.  Finally, it would not be unreasonable to consider elective tricuspid valve repair and Maze procedure.  Concomitant mitral ring annuloplasty might be warranted at the time of surgery     Plan:  I have again discussed the nature of the patient's symptoms of exertional shortness of breath and fatigue in relation to her cardiac disease.  We directly reviewed the results and images from her recent transesophageal echocardiogram and discussed their implications.  We discussed the natural history of severe tricuspid regurgitation and persistent atrial fibrillation at length.  We discussed treatment options including continued medical therapy with long-term anticoagulation and rate control with medical therapy for management of symptoms of congestive heart failure.  We discussed surgical alternatives including elective tricuspid valve repair and Maze procedure with or without concomitant mitral ring annuloplasty.  All of their questions have been addressed.  The patient hopes to proceed with surgical intervention in the near future, although for practical reasons related to the patient's family schedule she  hopes to wait until sometime in late March.  We tentatively plan for surgery on March 20, 2019.  The patient will return to our office for follow-up on March 04, 2019.  Prior to that she will undergo  CT angiography to evaluate the feasibility of peripheral arterial cannulation for surgery.  All of their questions have been addressed.   I spent in excess of 15 minutes during the conduct of this office consultation and >50% of this time involved direct face-to-face encounter with the patient for counseling and/or coordination of their care.    Valentina Gu. Roxy Manns, MD 01/28/2019 10:42 AM

## 2019-01-29 ENCOUNTER — Encounter: Payer: Self-pay | Admitting: *Deleted

## 2019-01-29 ENCOUNTER — Other Ambulatory Visit: Payer: Self-pay | Admitting: *Deleted

## 2019-01-29 DIAGNOSIS — I071 Rheumatic tricuspid insufficiency: Secondary | ICD-10-CM

## 2019-01-29 DIAGNOSIS — I340 Nonrheumatic mitral (valve) insufficiency: Secondary | ICD-10-CM

## 2019-02-04 ENCOUNTER — Other Ambulatory Visit: Payer: Self-pay

## 2019-02-04 ENCOUNTER — Encounter: Payer: Self-pay | Admitting: Cardiology

## 2019-02-04 ENCOUNTER — Ambulatory Visit (INDEPENDENT_AMBULATORY_CARE_PROVIDER_SITE_OTHER): Payer: Medicare Other | Admitting: Pharmacist

## 2019-02-04 ENCOUNTER — Ambulatory Visit (INDEPENDENT_AMBULATORY_CARE_PROVIDER_SITE_OTHER): Payer: Medicare Other | Admitting: Cardiology

## 2019-02-04 VITALS — BP 120/82 | HR 103 | Ht 65.5 in | Wt 168.0 lb

## 2019-02-04 DIAGNOSIS — I071 Rheumatic tricuspid insufficiency: Secondary | ICD-10-CM | POA: Diagnosis not present

## 2019-02-04 DIAGNOSIS — R0609 Other forms of dyspnea: Secondary | ICD-10-CM

## 2019-02-04 DIAGNOSIS — Q231 Congenital insufficiency of aortic valve: Secondary | ICD-10-CM

## 2019-02-04 DIAGNOSIS — I10 Essential (primary) hypertension: Secondary | ICD-10-CM

## 2019-02-04 DIAGNOSIS — E785 Hyperlipidemia, unspecified: Secondary | ICD-10-CM

## 2019-02-04 DIAGNOSIS — I4819 Other persistent atrial fibrillation: Secondary | ICD-10-CM

## 2019-02-04 DIAGNOSIS — I2510 Atherosclerotic heart disease of native coronary artery without angina pectoris: Secondary | ICD-10-CM | POA: Diagnosis not present

## 2019-02-04 DIAGNOSIS — Z7901 Long term (current) use of anticoagulants: Secondary | ICD-10-CM

## 2019-02-04 LAB — POCT INR: INR: 2.6 (ref 2.0–3.0)

## 2019-02-04 MED ORDER — WARFARIN SODIUM 5 MG PO TABS: 5.0000 mg | ORAL_TABLET | ORAL | 1 refills | Status: DC

## 2019-02-04 NOTE — Progress Notes (Signed)
PCP: Marin Olp, MD  CVTS: Dr. Roxy Manns  Clinic Note: Chief Complaint  Patient presents with  . Hospitalization Follow-up    Post-cath-TEE  . Shortness of Breath    Stable  . Cardiac Valve Problem    Tricuspid regurgitation  . Atrial Fibrillation    Essentially permanent.    HPI: Adriana Hendricks is a 84 y.o. female with a PMH notable for persistent PAF & Severe TR with worsening dyspnea over the past year who presents today for cardiology follow-up.  October 18, 2018 by [name], NP in the A. fib clinic (along with Dr. Thompson Grayer) --> she had been scheduled for cardioversion after anticoagulation, but was back in A. fib on follow-up evaluation.  Continues to note extreme shortness of breath with exertion feeling that she had "lung congestion".  Had been started on low-dose Lasix that has been stopped.  There was concern for echocardiogram showing severe dilated left atrium and right atrium as well as severe TR. -->  Per discussion with Dr. Thompson Grayer, felt that her best option going forward would be rate control as there was concern for using amiodarone with some baseline lung disease, and there was concerned about using other antiarrhythmics because of borderline QT interval and hypokalemia.  Questioned possibility of tricuspid valve repair and MAZE  COLLETTA SPILLERS was last seen on Nov 27, 2018 to discuss her initial consult with Dr. Roxy Manns -- scheduled for R&LHC + TEE.  Recent Hospitalizations:   Short-term successful cardioversion on October 02, 2018  Studies Personally Reviewed - (if available, images/films reviewed: From Epic Chart or Care Everywhere)  01/16/2019  TEE: Severe TR due to poor coaptation of the leaflets from annular dilation.  Mild to moderate mitral vegetation with mildly restricted mobility of the posterior leaflet.  EF 55-60% with no R WMA.  Severe RA and LA dilation.  R&LHC: Angiographically normal coronary arteries.  Normal LVEDP. Normal Pulmonary  Pressures. -- PAP ~23 mmHg & PCWP 18 mmHg.  LVEDP of 7 mmHg. -With mean PA pressure of 23 mmHg there is a transpulmonary gradient suggesting a primary pulmonary etiology.  She was seen by Dr. Roxy Manns on 01/28/2019 - planned for TV Repair & MAZE in March.  Interval History: Adriana Hendricks returns today for post-CATH/TEE follow-up after being seen by Dr. Roxy Manns from [place].  She still notes significant exertional dyspnea, particular made her short of breath.  Continues to deny any chest tightness or pressure with rest or exertion.  Still notes irregular heartbeats, and that her heart rate goes faster than 1 would expect with routine activity.    Thankfully, she has not had any syncope/near syncope or TIA/amaurosis fugax symptoms.  She continues to deny any PND orthopnea, but does have a little bit of increased edema.  No lightheadedness, dizziness, wooziness.  No claudication. No bleeding issues on warfarin (just some bruising at her radial cath site).  ROS: A comprehensive was performed. Review of Systems  Constitutional: Positive for malaise/fatigue. Negative for weight loss.  HENT: Negative for congestion and sore throat.   Respiratory: Positive for shortness of breath.   Cardiovascular:       Per HPI  Gastrointestinal: Negative for heartburn.  Genitourinary: Negative for dysuria.  Musculoskeletal: Negative for back pain and falls.  Neurological: Negative for dizziness, focal weakness and weakness.  Endo/Heme/Allergies: Does not bruise/bleed easily.  Psychiatric/Behavioral: The patient is nervous/anxious.     I have reviewed and (if needed) personally updated the patient's problem list,  medications, allergies, past medical and surgical history, social and family history.   Past Medical History:  Diagnosis Date  . Achilles tendon rupture   . Arthritis of hand, right   . Carpal tunnel syndrome 09/04/2013  . Collagenous colitis 2005  . Colon polyps 2010   Tubular adenoma and hyperplastic  . Dental  infection 10/25/2014  . Diverticulosis   . Esophageal stenosis   . Essential tremor   . GERD (gastroesophageal reflux disease)    hx hiatal hernia, hx esophagitis, hx stricture  . Heart murmur   . Hiatal hernia   . Hx: UTI (urinary tract infection)   . Hypertension   . Lower extremity neuropathy 08/23/2013   On B12 therapy with numbness in the feet bilaterally no evidence of diabetes   . Ocular migraine    jagged vision, a few per month  . OSA (obstructive sleep apnea) 12/21/2016   on CPAP  . Peripheral neuropathy    treated by Dr Posey Pronto (07/2015)  . Persistent atrial fibrillation    Rate control with beta-blocker and anticoagulated with warfarin  . Severe tricuspid regurgitation 07/2018   Noted Aug 2019 during a fib workup. Severe LAE as well    Past Surgical History:  Procedure Laterality Date  . 48 hr Holter Monitor  07/2018   Persistent Afib (rate 33 - 124 bpm)  . APPENDECTOMY    . CARDIOVERSION N/A 10/02/2018   Procedure: CARDIOVERSION;  Surgeon: Acie Fredrickson, Wonda Cheng, MD;  Location: [facility] ENDOSCOPY;  Service: Cardiovascular;  Laterality: N/A;  . CARPAL TUNNEL RELEASE    . CATARACT EXTRACTION    . EXCISION MORTON'S NEUROMA     Right foot  . INTRAOCULAR LENS IMPLANT, SECONDARY    . Moles removed    . MOUTH SURGERY     (For Exostosis)  . NUCLEAR  07/2018   EF 71 %. LOW RISK - no ischemia or Infarct.   Marland Kitchen RIGHT/LEFT HEART CATH AND CORONARY ANGIOGRAPHY N/A 01/16/2019   Procedure: RIGHT/LEFT HEART CATH AND CORONARY ANGIOGRAPHY;  Surgeon: Leonie Man, MD;  Location: [facility] CV LAB;; Angiographically normal coronary arteries.  Normal LVEDP. Upper Limit of Normal PAP~23 mmHg & PCWP 18 mmHg.  LVEDP of 7 mmHg. -With mean PAP of 23 mmHg there is a TPG suggesting a primary pulmonary etiology.  Marland Kitchen ROTATOR CUFF REPAIR    . TEE WITHOUT CARDIOVERSION N/A 01/16/2019   Procedure: TRANSESOPHAGEAL ECHOCARDIOGRAM (TEE);  Surgeon: Sueanne Margarita, MD;  Location: [facility] ENDOSCOPY;;  Severe TR due to  poor coaptation of the leaflets from annular dilation.  Mild to moderate mitral vegetation with mildly restricted mobility of the posterior leaflet.  EF 55-60% with no R WMA.  Severe RA and LA dilation.  . TONSILLECTOMY    . TRANSTHORACIC ECHOCARDIOGRAM  07/2018   Normal LV Fxn (EF 60-65%) no RWMA.  Severe LA dilation & Severe TR.    Current Meds  Medication Sig  . calcium carbonate (TUMS - DOSED IN MG ELEMENTAL CALCIUM) 500 MG chewable tablet Chew 1 tablet by mouth daily.   . cholecalciferol (VITAMIN D3) 25 MCG (1000 UT) tablet Take 1,000 Units by mouth daily.  . famotidine (PEPCID) 20 MG tablet Take 20 mg by mouth at bedtime.  . Multiple Vitamins-Minerals (ICAPS AREDS 2) CAPS Take 2 capsules by mouth daily.   . nadolol (CORGARD) 40 MG tablet Take 0.5 tablets (20 mg total) by mouth daily. (Patient taking differently: Take 40 mg by mouth daily. )  . Omega-3 Fatty Acids (  FISH OIL) 1200 MG CAPS Take 1,200 mg by mouth daily.  . [DISCONTINUED] warfarin (COUMADIN) 2.5 MG tablet Take 2.5 mg by mouth every Monday,Wednesday,Friday, and Sunday at 6 PM.  . [DISCONTINUED] warfarin (COUMADIN) 5 MG tablet Take 5 mg by mouth every Tuesday, Thursday, and Saturday at 6 PM.    Allergies  Allergen Reactions  . Clarithromycin Nausea Only  . Erythromycin Base Nausea And Vomiting  . Levofloxacin Other (See Comments)    Patient has a history of a Achilles tendon tear and developed tendon pain while on medication  . Potassium Chloride Hives  . Metronidazole Rash  . Penicillins Rash and Other (See Comments)    Has patient had a PCN reaction causing immediate rash, facial/tongue/throat swelling, SOB or lightheadedness with hypotension: Unknown Has patient had a PCN reaction causing severe rash involving mucus membranes or skin necrosis: No Has patient had a PCN reaction that required hospitalization: Yes Has patient had a PCN reaction occurring within the last 10 years: No If all of the above answers are "NO",  then may proceed with Cephalosporin use.     Social History   Tobacco Use  . Smoking status: Former Smoker    Packs/day: 2.00    Years: 13.00    Pack years: 26.00    Types: Cigarettes    Start date: 12/26/1954    Last attempt to quit: 03/26/1968    Years since quitting: 50.9  . Smokeless tobacco: Never Used  . Tobacco comment: Quit in 1969 - smokeed 2-3PPD , was 3 PPD at her heaviest for about 3 years.   Substance Use Topics  . Alcohol use: Yes    Alcohol/week: 2.0 standard drinks    Types: 2 Glasses of wine per week    Comment: 2 glass of wine per day  . Drug use: No   Social History   Social History Narrative   Married. Husband is patient of Dr. Yong Channel.    Married 1958. 2 children. 4 grandkids (3 girls, 1 boy).    Retired from [employer].   Hobbies: genealogy and DNA    family history includes Barrett's esophagus in her son; Heart failure in her father; Other in her mother; Rectal cancer in her father.  Wt Readings from Last 3 Encounters:  02/04/19 168 lb (76.2 kg)  01/28/19 170 lb (77.1 kg)  01/15/19 170 lb (77.1 kg)    PHYSICAL EXAM BP 120/82   Pulse (!) 103   Ht 5' 5.5" (1.664 m)   Wt 168 lb (76.2 kg)   LMP  (LMP Unknown)   BMI 27.53 kg/m  Physical Exam  Constitutional: She is oriented to person, place, and time. She appears well-developed and well-nourished. No distress.  Healthy-appearing.  Well-groomed.  HENT:  Head: Normocephalic and atraumatic.  Neck: No hepatojugular reflux and no JVD (Notably, no cannon A waves.) present.  Cardiovascular: Normal rate, intact distal pulses and normal pulses. An irregularly irregular rhythm present. PMI is not displaced. Exam reveals no gallop and no friction rub.  Murmur (May be soft HSM, left lower sternal border, but very difficult to hear.) heard. Rate is slower on exam than on vital signs.  Rate currently in the 85-90 range.  Pulmonary/Chest: Effort normal and breath sounds normal.  No respiratory distress. She has no wheezes. She has no rales. She exhibits no tenderness.  Musculoskeletal: Normal range of motion.        General: No edema.     Comments: Radial  and brachial access sites C/D/C -no hematoma  Neurological: She is alert and oriented to person, place, and time.  Psychiatric: She has a normal mood and affect. Her behavior is normal. Judgment and thought content normal.  Vitals reviewed.    Adult ECG Report Not checked  Other studies Reviewed: Additional studies/ records that were reviewed today include:  Recent Labs:     Lab Results  Component Value Date   CREATININE 0.82 01/15/2019   BUN 23 01/15/2019   NA 138 01/16/2019   NA 140 01/16/2019   K 3.4 (L) 01/16/2019   K 3.2 (L) 01/16/2019   CL 96 01/15/2019   CO2 33 (H) 01/15/2019   Lab Results  Component Value Date   CHOL 191 09/18/2018   HDL 53.70 09/18/2018   LDLCALC 116 (H) 09/18/2018   LDLDIRECT 128.8 01/17/2011   TRIG 106.0 09/18/2018   CHOLHDL 4 09/18/2018    ASSESSMENT / PLAN: Problem List Items Addressed This Visit    Bicuspid aortic valve    Likely functional, senile calcific valve.  Barely auscultate a murmur on exam from this.  With no evidence of stenosis, no plans to follow specifically, but we will be following her tricuspid valve.      DOE (dyspnea on exertion) (Chronic)    Pretty much stable.  Hopefully this will improve with her surgery.      Essential hypertension (Chronic)    Stable on allow".      Hyperlipemia (Chronic)    116.  Monitored by PCP.  Not currently on statin.  At this point I think with minimal CAD, she is fine without medications.      Persistent atrial fibrillation - Primary (Chronic)    Plan for now is rate control.  She is anticoagulated with warfarin (this will be held perioperatively.  Plan is for MAZE PROCEDURE in the setting of tricuspid valve repair.  Will probably stay on anticoagulation for several months postop, and then reassess.   Hopefully, in the absence of tricuspid regurgitation, her dyspnea will improve.  Relatively well rate controlled on current dose of nadolol, however with concerns for hypotension I am reluctant to push it too much.  Her heart rate went to 103 walking in which is not unusual.  At this point I would not titrate any further unless there is recurrent tachycardia spells at rest.      Severe tricuspid regurgitation (Chronic)    Confirmed on TEE.  With profound dyspnea and A. fib, the plan is for her to have [place] (will also evaluate mitral valve at that time) as well as MAZE PROCEDURE --will be scheduled with Dr. Roxy Manns.  Preop cardiac catheterization shows no coronary arteries and relatively normal pulmonary pressures.        I spent a total of 30 minutes with the patient and chart review. >  50% of the time was spent in direct patient consultation.    Current medicines are reviewed at length with the patient today.  (+/- concerns) none The following changes have been made:  none  Patient Instructions  Medication Instructions:  Not needed If you need a refill on your cardiac medications before your next appointment, please call your pharmacy.   Lab work: Not needed If you have labs (blood work) drawn today and your tests are completely normal, you will receive your results only by: Marland Kitchen MyChart Message (if you have MyChart) OR . A paper copy in the mail If you have any lab test  that is abnormal or we need to change your treatment, we will call you to review the results.  Testing/Procedures: Not needed  Follow-Up: At [facility], you and your health needs are our priority.  As part of our continuing mission to provide you with exceptional heart care, we have created designated Provider Care Teams.  These Care Teams include your primary Cardiologist (physician) and Advanced Practice Providers (APPs -  Physician Assistants and Nurse Practitioners) who all work together to  provide you with the care you need, when you need it. You will need a follow up appointment in 5 months MAY 2020.  Please call our office 2 months in advance to schedule this appointment.  You may see Glenetta Hew, MD  or one of the following Advanced Practice Providers on your designated Care Team:   Rosaria Ferries, PA-C . Jory Sims, DNP, ANP  Any Other Special Instructions Will Be Listed Below (If Applicable).    Studies Ordered:   No orders of the defined types were placed in this encounter.     Glenetta Hew, M.D., M.S. Interventional Cardiologist   Pager # [phone] Phone # [phone] [address] [place], [place] [address]   Thank you for choosing Heartcare at [facility]!!

## 2019-02-04 NOTE — Patient Instructions (Signed)
Medication Instructions:  Not needed If you need a refill on your cardiac medications before your next appointment, please call your pharmacy.   Lab work: Not needed If you have labs (blood work) drawn today and your tests are completely normal, you will receive your results only by: Marland Kitchen MyChart Message (if you have MyChart) OR . A paper copy in the mail If you have any lab test that is abnormal or we need to change your treatment, we will call you to review the results.  Testing/Procedures: Not needed  Follow-Up: At [facility], you and your health needs are our priority.  As part of our continuing mission to provide you with exceptional heart care, we have created designated Provider Care Teams.  These Care Teams include your primary Cardiologist (physician) and Advanced Practice Providers (APPs -  Physician Assistants and Nurse Practitioners) who all work together to provide you with the care you need, when you need it. You will need a follow up appointment in 5 months MAY 2020.  Please call our office 2 months in advance to schedule this appointment.  You may see Glenetta Hew, MD  or one of the following Advanced Practice Providers on your designated Care Team:   Rosaria Ferries, PA-C . Jory Sims, DNP, ANP  Any Other Special Instructions Will Be Listed Below (If Applicable).

## 2019-02-05 ENCOUNTER — Encounter: Payer: Self-pay | Admitting: Cardiology

## 2019-02-06 NOTE — Assessment & Plan Note (Signed)
Stable on allow".

## 2019-02-06 NOTE — Assessment & Plan Note (Addendum)
Plan for now is rate control.  She is anticoagulated with warfarin (this will be held perioperatively.  Plan is for MAZE PROCEDURE in the setting of tricuspid valve repair.  Will probably stay on anticoagulation for several months postop, and then reassess.  Hopefully, in the absence of tricuspid regurgitation, her dyspnea will improve.  Relatively well rate controlled on current dose of nadolol, however with concerns for hypotension I am reluctant to push it too much.  Her heart rate went to 103 walking in which is not unusual.  At this point I would not titrate any further unless there is recurrent tachycardia spells at rest.

## 2019-02-06 NOTE — Assessment & Plan Note (Signed)
116.  Monitored by PCP.  Not currently on statin.  At this point I think with minimal CAD, she is fine without medications.

## 2019-02-06 NOTE — Assessment & Plan Note (Signed)
Likely functional, senile calcific valve.  Barely auscultate a murmur on exam from this.  With no evidence of stenosis, no plans to follow specifically, but we will be following her tricuspid valve.

## 2019-02-06 NOTE — Assessment & Plan Note (Signed)
Pretty much stable.  Hopefully this will improve with her surgery.

## 2019-02-06 NOTE — Assessment & Plan Note (Signed)
Confirmed on TEE.  With profound dyspnea and A. fib, the plan is for her to have [place] (will also evaluate mitral valve at that time) as well as MAZE PROCEDURE --will be scheduled with Dr. Roxy Manns.  Preop cardiac catheterization shows no coronary arteries and relatively normal pulmonary pressures.

## 2019-02-27 ENCOUNTER — Ambulatory Visit
Admission: RE | Admit: 2019-02-27 | Discharge: 2019-02-27 | Disposition: A | Discharge status: Home / Self Care | Payer: Medicare Other | Source: Ambulatory Visit | Attending: Thoracic Surgery (Cardiothoracic Vascular Surgery) | Admitting: Thoracic Surgery (Cardiothoracic Vascular Surgery)

## 2019-02-27 DIAGNOSIS — I77811 Abdominal aortic ectasia: Secondary | ICD-10-CM | POA: Diagnosis not present

## 2019-02-27 DIAGNOSIS — I071 Rheumatic tricuspid insufficiency: Secondary | ICD-10-CM

## 2019-02-27 DIAGNOSIS — Z01818 Encounter for other preprocedural examination: Secondary | ICD-10-CM | POA: Diagnosis not present

## 2019-02-27 DIAGNOSIS — I77810 Thoracic aortic ectasia: Secondary | ICD-10-CM | POA: Diagnosis not present

## 2019-02-27 MED ORDER — IOPAMIDOL (ISOVUE-370) INJECTION 76%
75.0000 mL | Freq: Once | INTRAVENOUS | Status: AC | PRN
  Administered 2019-02-27: 75 mL via INTRAVENOUS

## 2019-03-04 ENCOUNTER — Ambulatory Visit (INDEPENDENT_AMBULATORY_CARE_PROVIDER_SITE_OTHER): Payer: Medicare Other | Admitting: Thoracic Surgery (Cardiothoracic Vascular Surgery)

## 2019-03-04 ENCOUNTER — Telehealth: Payer: Self-pay | Admitting: *Deleted

## 2019-03-04 ENCOUNTER — Encounter: Payer: Self-pay | Admitting: Thoracic Surgery (Cardiothoracic Vascular Surgery)

## 2019-03-04 ENCOUNTER — Ambulatory Visit (INDEPENDENT_AMBULATORY_CARE_PROVIDER_SITE_OTHER): Payer: Medicare Other | Admitting: *Deleted

## 2019-03-04 VITALS — BP 140/90 | HR 100 | Resp 20 | Ht 65.5 in | Wt 169.0 lb

## 2019-03-04 DIAGNOSIS — I2510 Atherosclerotic heart disease of native coronary artery without angina pectoris: Secondary | ICD-10-CM | POA: Diagnosis not present

## 2019-03-04 DIAGNOSIS — I482 Chronic atrial fibrillation, unspecified: Secondary | ICD-10-CM | POA: Diagnosis not present

## 2019-03-04 DIAGNOSIS — I071 Rheumatic tricuspid insufficiency: Secondary | ICD-10-CM | POA: Diagnosis not present

## 2019-03-04 DIAGNOSIS — I340 Nonrheumatic mitral (valve) insufficiency: Secondary | ICD-10-CM

## 2019-03-04 DIAGNOSIS — I361 Nonrheumatic tricuspid (valve) insufficiency: Secondary | ICD-10-CM | POA: Diagnosis not present

## 2019-03-04 DIAGNOSIS — I4819 Other persistent atrial fibrillation: Secondary | ICD-10-CM | POA: Diagnosis not present

## 2019-03-04 DIAGNOSIS — Z7901 Long term (current) use of anticoagulants: Secondary | ICD-10-CM

## 2019-03-04 LAB — POCT INR: INR: 2.4 (ref 2.0–3.0)

## 2019-03-04 NOTE — Progress Notes (Signed)
[address]       [place], [address]             [phone]     CARDIOTHORACIC SURGERY OFFICE NOTE  Primary Cardiologist is Glenetta Hew, MD PCP is Marin Olp, MD   HPI:  Patient is an 84 year old female with history of hypertension and obstructive sleep apnea on home CPAP who returns to the office today to further discuss treatment options for management of persistent atrial fibrillation that has failed an attempt at DC cardioversion and severe tricuspid regurgitation.  She was initially seen in consultation on January 07, 2019.  She subsequently underwent TEE and diagnostic cardiac catheterization and she was last seen here in our office on January 28, 2019.  At that time we made tentative plans for elective surgical intervention on March 21, 2019.  She returns to our office today for follow-up with hopes to proceed with surgery as previously planned.  She reports no new problems or complaints.  She does state that her breathing continues to slowly get worse.  She now gets short of breath with low-level activity.  She denies any resting shortness of breath.  She has not developed worsening lower extremity edema.  She has no chest pain.  The remainder of her review of systems is unremarkable.   Current Outpatient Medications  Medication Sig Dispense Refill  . calcium carbonate (TUMS - DOSED IN MG ELEMENTAL CALCIUM) 500 MG chewable tablet Chew 1 tablet by mouth daily.     . chlorthalidone (HYGROTON) 25 MG tablet Take 1 tablet (25 mg total) by mouth daily. 30 tablet 6  . cholecalciferol (VITAMIN D3) 25 MCG (1000 UT) tablet Take 1,000 Units by mouth daily.    . famotidine (PEPCID) 20 MG tablet Take 20 mg by mouth at bedtime.    . Multiple Vitamins-Minerals (ICAPS AREDS 2) CAPS Take 2 capsules by mouth daily.     . nadolol (CORGARD) 40 MG tablet Take 0.5 tablets (20 mg total) by mouth daily. (Patient taking differently: Take 40 mg by mouth daily. ) 903 tablet 3   . Omega-3 Fatty Acids (FISH OIL) 1200 MG CAPS Take 1,200 mg by mouth daily.    Marland Kitchen warfarin (COUMADIN) 5 MG tablet Take 1 tablet (5 mg total) by mouth every Tuesday, Thursday, and Saturday at 6 PM. Take 1/2 to 1 tablet daily as directed by coumadin clinic 90 tablet 1   No current facility-administered medications for this visit.       Physical Exam:   BP 140/90   Pulse 100   Resp 20   Ht 5' 5.5" (1.664 m)   Wt 169 lb (76.7 kg)   LMP  (LMP Unknown)   SpO2 95% Comment: RA  BMI 27.70 kg/m   General:  Well-appearing  Chest:   Clear to auscultation  CV:   Irregular rate and rhythm  Incisions:  n/a  Abdomen:  Soft nontender  Extremities:  Warm and well-perfused, no edema  Diagnostic Tests:  CT ANGIOGRAPHY CHEST, ABDOMEN AND PELVIS  TECHNIQUE: Multidetector CT imaging through the chest, abdomen and pelvis was performed using the standard protocol during bolus administration of intravenous contrast. Multiplanar reconstructed images and MIPs were obtained and reviewed to evaluate the vascular anatomy.  CONTRAST:  75mL ISOVUE-370 IOPAMIDOL (ISOVUE-370) INJECTION 76%  COMPARISON:  None.  FINDINGS: CTA CHEST FINDINGS  Cardiovascular: Maximal diameter of the ascending aorta at the sinus of Valsalva, sino-tubular junction, and ascending aorta are 3.9  cm, 3.1 cm, and 3.9 cm. Allowing for cardiac motion artifact, there is no evidence of dissection. There is no evidence of acute intramural hematoma on precontrast images. Minimal aortic valvular calcifications are noted. Atherosclerotic calcifications of the aortic arch are present. No significant coronary artery calcifications.  Great vessels are patent. The right atrium is markedly enlarged. The left atrium is moderately enlarged. There is low-density in the non dependent portion of the left atrial appendage. This may represent unopacified blood. Thrombus is not excluded.  Mediastinum/Nodes: 11 mm short axis diameter  precarinal lymph node. Other smaller scattered mediastinal nodes are noted. No pericardial effusion. The thyroid is within normal limits. Small hiatal hernia.  Lungs/Pleura: Subsegmental atelectasis towards lung bases. No pneumothorax or pleural effusion.  Musculoskeletal: No vertebral compression deformity.  Review of the MIP images confirms the above findings.  CTA ABDOMEN AND PELVIS FINDINGS  VASCULAR  Aorta: Nonaneurysmal and patent with scattered atherosclerotic calcification. There is no evidence of aortic dissection.  Celiac: Patent.  Branch vessels patent.  SMA: Patent.  Renals: Single renal arteries are patent.  IMA: Diminutive and patent.  Inflow: Bilateral common, internal, and external iliac arteries are widely patent. There is no evidence of dissection.  Veins: Retroaortic left renal vein anatomy.  Review of the MIP images confirms the above findings.  NON-VASCULAR  Hepatobiliary: Unremarkable  Pancreas: Unremarkable  Spleen: Unremarkable  Adrenals/Urinary Tract: 12 mm left adrenal nodule is nonspecific. Right adrenal gland and kidneys are within normal limits. Bladder is decompressed.  Stomach/Bowel: No obvious mass in the colon. Small bowel and stomach are decompressed.  Lymphatic: No abnormal retroperitoneal adenopathy.  Reproductive: Uterus and adnexa are within normal limits.  Other: No free fluid.  Musculoskeletal: No vertebral compression deformity. Degenerative disc disease throughout the lumbar spine with facet arthropathy.  Review of the MIP images confirms the above findings.  IMPRESSION: Vascular:  No luminal narrowing involving the aorta or iliac arterial system. Great vessels are patent.  **An incidental finding of potential clinical significance has been found. There is low-density within the non dependent portion of the left atrial appendage. Although this may simply represent mixing artifact  with non-opacified blood, thrombus in the left atrial appendage can not be excluded. Please note that transesophageal echo was performed 01/16/2019 demonstrating no evidence of thrombus in the left atrial appendage.**  Maximal dilatation of the ascending aorta is 3.9 cm. This is nonaneurysmal and upper limits of normal in caliber. There is no evidence of dissection or acute intramural hematoma.  Marked dilatation of the right atrium. Moderate dilatation of the left atrium.  Nonvascular:  11 mm short axis diameter precarinal lymph node. This is mildly enlarged. Consider 3 month follow-up CT to ensure resolution as inflammatory disease versus neoplasm is not excluded.  12 mm left adrenal nodule is nonspecific on CT with contrast arterial phase imaging. If there is a history of malignancy or risk of malignancy, consider six-month follow-up MRI.   Electronically Signed   By: Marybelle Killings M.D.   On: 02/27/2019 09:23   Impression:  Patient has severe tricuspid regurgitation and persistent atrial fibrillation that has failed an attempt at DC cardioversion.She describes a history consistent with a long gradual progression of symptoms of exertional shortness of breath and fatigue. For the last several months the patient complains that she gets short of breath with low-level activity,consistent with symptoms of chronic diastolic congestive heart failure, New York Heart Association functional class III. At least some of the patient's symptoms of exertional shortness  of breath may be related to physical deconditioning and decreased activity. She denies symptoms of lower extremity edema or abdominal bloating to suggest the presence of significant right-sided heart failure. She does not experience any exertional chest pain or chest tightness.  I have personally reviewed the patient's recent transesophageal echocardiogram, diagnostic cardiac catheterization, and CT angiogram. The  patient has normal left ventricular size and systolic function. There is severe left atrial enlargement and mild to moderate mitral regurgitation.   There is annular dilatation with normal leaflet mobility.  The leaflets are mildly thickened with myxomatous degenerative change.  The aortic valve is bicuspid.  There is no significant aortic stenosis or aortic insufficiency.  The leaflets are thin and move well.  There is severe tricuspid regurgitation. Right ventricular size and function appear essentially normal.   The right atrium is severely dilated and there is severe annular enlargement of the tricuspid annulus.  There appears to be normal leaflet mobility of the tricuspid valve with mild myxomatous change and no prolapse.  There does not appear to be any significant left ventricular septal displacement or pulmonary hypertension.  Diagnostic cardiac catheterization is notable for the absence of significant coronary artery disease.  Right heart pressures and cardiac output were normal.  CT angiography revealed no contraindications to peripheral cannulation for surgery.  Options include continued medical therapy with long-term anticoagulation and rate control for management of atrial fibrillation with careful attention to medical therapy for congestive heart failure. Catheter-based ablation could be attempted, but I agree that the long-term likelihood of success would probably be relatively low because of the patient's advanced age, severe left atrial enlargement, and severe tricuspid regurgitation.  Alternatively, the patient could undergo tricuspid valve repair and Maze procedure.  Concomitant mitral ring annuloplasty may be warranted at the time of surgery   Plan:  I have again reviewed treatment options at length with the patient and her husband today.  We compared and contrasted long-term prognosis with medical therapy alone versus elective surgical intervention.  We plan to proceed with  tricuspid valve repair and Maze procedure with possible mitral annuloplasty on March 21, 2019.  Alternative surgical approaches have been discussed including a comparison between conventional sternotomy and minimally-invasive techniques.  The relative risks and benefits of each have been reviewed as they pertain to the patient's specific circumstances, and all of their questions have been addressed.  The patient understands and accepts all potential risks of surgery including but not limited to risk of death, stroke or other neurologic complication, myocardial infarction, congestive heart failure, respiratory failure, renal failure, bleeding requiring transfusion and/or reexploration, arrhythmia, infection or other wound complications, pneumonia, pleural and/or pericardial effusion, pulmonary embolus, aortic dissection or other major vascular complication, or delayed complications related to valve repair or replacement including but not limited to structural valve deterioration and failure, thrombosis, embolization, endocarditis, or paravalvular leak.  Specific risks potentially related to the minimally-invasive approach were discussed at length, including but not limited to risk of conversion to full or partial sternotomy, aortic dissection or other major vascular complication, unilateral acute lung injury or pulmonary edema, phrenic nerve dysfunction or paralysis, rib fracture, chronic pain, lung hernia, or lymphocele.  All of their questions have been answered.  The patient has been instructed to stop taking Coumadin and fish oil capsules 7 days prior to surgery.   I spent in excess of 15 minutes during the conduct of this office consultation and >50% of this time involved direct face-to-face encounter with the patient for  counseling and/or coordination of their care.    Valentina Gu. Roxy Manns, MD 03/04/2019 10:43 AM

## 2019-03-04 NOTE — Patient Instructions (Signed)
Stop taking Coumadin (warfarin) and fish oil capsules on Wednesday March 18th, 7 days prior to surgery  Continue taking all other medications without change through the day before surgery.  Have nothing to eat or drink after midnight the night before surgery.  On the morning of surgery do not take any of your regular medications

## 2019-03-04 NOTE — Telephone Encounter (Signed)
[employer] Research study; Left message on patient voice mail to call research office [phone]) for more information about study.

## 2019-03-04 NOTE — Patient Instructions (Addendum)
Description   Continue with warfarin 5 mg Tuesdays, Thursdays and Saturdays, 2.5 mg all other days. Take last dose on 03/13/19.  Call us when you are alert after discharge, [phone] [facility].  Repeat INR in 4 weeks Surgery March 26th.

## 2019-03-09 ENCOUNTER — Encounter: Payer: Self-pay | Admitting: Family Medicine

## 2019-03-11 ENCOUNTER — Telehealth: Payer: Self-pay | Admitting: Cardiology

## 2019-03-11 ENCOUNTER — Ambulatory Visit: Payer: Medicare Other | Admitting: Family Medicine

## 2019-03-11 ENCOUNTER — Telehealth: Payer: Self-pay

## 2019-03-11 NOTE — Telephone Encounter (Signed)
Per pt is stable at this time and does not want to proceed with procedure is wanting to leave bed available for those more ill  at this time and does not want to take  risk for exposure of Covid 19 .Will forward to Dr [name] Pt is also going to let Dr Guy Sandifer office aware as well .Adonis Housekeeper

## 2019-03-11 NOTE — Telephone Encounter (Signed)
Cancelled patient's surgery 03/21/2019 per patient's request.  Patient will follow-up with Dr. Roxy Manns in April to reschedule.

## 2019-03-11 NOTE — Telephone Encounter (Signed)
Patient is calling to let you know she is cancel her heart surgery scheduled for 3/26. She said she is going to contact Dr. Guy Sandifer.   She stated she wants to leave the ICU bed open for the patient that might really need it.

## 2019-03-11 NOTE — Telephone Encounter (Signed)
Makes sense  Adriana Harding, MD  

## 2019-03-14 ENCOUNTER — Other Ambulatory Visit: Payer: Self-pay | Admitting: *Deleted

## 2019-03-19 ENCOUNTER — Other Ambulatory Visit (HOSPITAL_COMMUNITY): Payer: Medicare Other

## 2019-03-19 ENCOUNTER — Ambulatory Visit: Payer: Medicare Other | Admitting: Family Medicine

## 2019-03-19 ENCOUNTER — Encounter (HOSPITAL_COMMUNITY): Payer: Medicare Other

## 2019-03-21 ENCOUNTER — Inpatient Hospital Stay: Admit: 2019-03-21 | Payer: Medicare Other | Admitting: Thoracic Surgery (Cardiothoracic Vascular Surgery)

## 2019-03-21 SURGERY — REPAIR, TRICUSPID VALVE, TRANSCATHETER
Anesthesia: General | Site: Chest | Laterality: Right

## 2019-04-08 ENCOUNTER — Ambulatory Visit: Payer: Medicare Other | Admitting: Thoracic Surgery (Cardiothoracic Vascular Surgery)

## 2019-04-10 ENCOUNTER — Other Ambulatory Visit: Payer: Self-pay | Admitting: Pharmacist

## 2019-04-10 MED ORDER — CHLORTHALIDONE 25 MG PO TABS: 25.0000 mg | ORAL_TABLET | Freq: Every day | ORAL | 1 refills | Status: DC

## 2019-04-12 ENCOUNTER — Telehealth: Payer: Self-pay

## 2019-04-12 NOTE — Telephone Encounter (Signed)

## 2019-04-12 NOTE — Telephone Encounter (Signed)
lmom for prescreen/drive thru 

## 2019-04-15 ENCOUNTER — Telehealth: Payer: Self-pay | Admitting: Cardiovascular Disease

## 2019-04-15 ENCOUNTER — Ambulatory Visit (INDEPENDENT_AMBULATORY_CARE_PROVIDER_SITE_OTHER): Payer: Medicare Other | Admitting: *Deleted

## 2019-04-15 ENCOUNTER — Other Ambulatory Visit: Payer: Self-pay

## 2019-04-15 DIAGNOSIS — Z7901 Long term (current) use of anticoagulants: Secondary | ICD-10-CM | POA: Diagnosis not present

## 2019-04-15 DIAGNOSIS — I4819 Other persistent atrial fibrillation: Secondary | ICD-10-CM

## 2019-04-15 LAB — POCT INR: INR: 3.4 — AB (ref 2.0–3.0)

## 2019-04-15 NOTE — Telephone Encounter (Signed)
Error. cdm

## 2019-04-15 NOTE — Patient Instructions (Signed)
Description   Spoke with pt and instructed pt to hold today's dose then continue with warfarin 5 mg Tuesdays, Thursdays and Saturdays, 2.5 mg all other days.  Call us when you are alert after discharge, [phone] [facility].  Repeat INR in 4 weeks.

## 2019-04-24 ENCOUNTER — Telehealth: Payer: Self-pay

## 2019-04-24 NOTE — Telephone Encounter (Signed)
Virtual Visit Pre-Appointment Phone Call  "(Adriana Hendricks), I am calling you today to discuss your upcoming appointment. We are currently trying to limit exposure to the virus that causes COVID-19 by seeing patients at home rather than in the office."  1. "What is the BEST phone number to call the day of the visit?" - [phone]  2. "Do you have or have access to (through a family member/friend) a smartphone with video capability that we can use for your visit?" a. If no - list the appointment type as a PHONE visit in appointment notes  3. Confirm consent - "In the setting of the current Covid19 crisis, you are scheduled for a (phone) visit with your provider on (May 11 ) at (1:20pm).  Just as we do with many in-office visits, in order for you to participate in this visit, we must obtain consent.  If you'd like, I can send this to your mychart (if signed up) or email for you to review.  Otherwise, I can obtain your verbal consent now.  All virtual visits are billed to your insurance company just like a normal visit would be.  By agreeing to a virtual visit, we'd like you to understand that the technology does not allow for your provider to perform an examination, and thus may limit your provider's ability to fully assess your condition. If your provider identifies any concerns that need to be evaluated in person, we will make arrangements to do so.  Finally, though the technology is pretty good, we cannot assure that it will always work on either your or our end, and in the setting of a video visit, we may have to convert it to a phone-only visit.  In either situation, we cannot ensure that we have a secure connection.  Are you willing to proceed?" STAFF: Did the patient verbally acknowledge consent to telehealth visit? Document YES/NO here: YES  4. Advise patient to be prepared - "Two hours prior to your appointment, go ahead and check your blood pressure, pulse, oxygen saturation, and your weight  (if you have the equipment to check those) and write them all down. When your visit starts, your provider will ask you for this information. If you have an Apple Watch or Kardia device, please plan to have heart rate information ready on the day of your appointment. Please have a pen and paper handy nearby the day of the visit as well."  5. Give patient instructions for MyChart download to smartphone OR [email or web address] as below if video visit (depending on what platform provider is using)  6. Inform patient they will receive a phone call 15 minutes prior to their appointment time (may be from unknown caller ID) so they should be prepared to answer    Adriana Hendricks has been deemed a candidate for a follow-up tele-health visit to limit community exposure during the Covid-19 pandemic. I spoke with the patient via phone to ensure availability of phone/video source, confirm preferred email & phone number, and discuss instructions and expectations.  I reminded Adriana Hendricks to be prepared with any vital sign and/or heart rhythm information that could potentially be obtained via home monitoring, at the time of her visit. I reminded Adriana Hendricks to expect a phone call prior to her visit.  [name], [place] 04/24/2019 2:34 PM   INSTRUCTIONS FOR DOWNLOADING THE MYCHART APP TO SMARTPHONE  - The patient must first make sure to have activated MyChart and  know their login information - If Apple, go to [employer] and type in MyChart in the search bar and download the app. If Android, ask patient to go to [employer] and type in [place] in the search bar and download the app. The app is free but as with any other app downloads, their phone may require them to verify saved payment information or Apple/Android password.  - The patient will need to then log into the app with their MyChart username and password, and select [facility] as their healthcare provider to link the account.  When it is time for your visit, go to the MyChart app, find appointments, and click Begin Video Visit. Be sure to Select Allow for your device to access the Microphone and Camera for your visit. You will then be connected, and your provider will be with you shortly.  **If they have any issues connecting, or need assistance please contact MyChart service desk [phone])**  **If using a computer, in order to ensure the best quality for their visit they will need to use either of the following Internet Browsers: [employer], or Google Chrome**  IF USING DOXIMITY or DOXY.ME - The patient will receive a link just prior to their visit by text.     FULL LENGTH CONSENT FOR TELE-HEALTH VISIT   I hereby voluntarily request, consent and authorize [facility] and its employed or contracted physicians, physician assistants, nurse practitioners or other licensed health care professionals (the Practitioner), to provide me with telemedicine health care services (the "Services") as deemed necessary by the treating Practitioner. I acknowledge and consent to receive the Services by the Practitioner via telemedicine. I understand that the telemedicine visit will involve communicating with the Practitioner through live audiovisual communication technology and the disclosure of certain medical information by electronic transmission. I acknowledge that I have been given the opportunity to request an in-person assessment or other available alternative prior to the telemedicine visit and am voluntarily participating in the telemedicine visit.  I understand that I have the right to withhold or withdraw my consent to the use of telemedicine in the course of my care at any time, without affecting my right to future care or treatment, and that the Practitioner or I may terminate the telemedicine visit at any time. I understand that I have the right to inspect all information obtained and/or recorded in the  course of the telemedicine visit and may receive copies of available information for a reasonable fee.  I understand that some of the potential risks of receiving the Services via telemedicine include:  Marland Kitchen Delay or interruption in medical evaluation due to technological equipment failure or disruption; . Information transmitted may not be sufficient (e.g. poor resolution of images) to allow for appropriate medical decision making by the Practitioner; and/or  . In rare instances, security protocols could fail, causing a breach of personal health information.  Furthermore, I acknowledge that it is my responsibility to provide information about my medical history, conditions and care that is complete and accurate to the best of my ability. I acknowledge that Practitioner's advice, recommendations, and/or decision may be based on factors not within their control, such as incomplete or inaccurate data provided by me or distortions of diagnostic images or specimens that may result from electronic transmissions. I understand that the practice of medicine is not an exact science and that Practitioner makes no warranties or guarantees regarding treatment outcomes. I acknowledge that I will receive a copy of this  consent concurrently upon execution via email to the email address I last provided but may also request a printed copy by calling the office of [place].    I understand that my insurance will be billed for this visit.   I have read or had this consent read to me. . I understand the contents of this consent, which adequately explains the benefits and risks of the Services being provided via telemedicine.  . I have been provided ample opportunity to ask questions regarding this consent and the Services and have had my questions answered to my satisfaction. . I give my informed consent for the services to be provided through the use of telemedicine in my medical care  By participating in this  telemedicine visit I agree to the above.

## 2019-04-24 NOTE — Telephone Encounter (Signed)
Left voicemail for patient to call office back to change office visit to virtual visit on May 11th Due to covid 19.

## 2019-05-01 ENCOUNTER — Encounter: Payer: Self-pay | Admitting: Thoracic Surgery (Cardiothoracic Vascular Surgery)

## 2019-05-01 ENCOUNTER — Telehealth (INDEPENDENT_AMBULATORY_CARE_PROVIDER_SITE_OTHER): Payer: Medicare Other | Admitting: Thoracic Surgery (Cardiothoracic Vascular Surgery)

## 2019-05-01 DIAGNOSIS — I4819 Other persistent atrial fibrillation: Secondary | ICD-10-CM

## 2019-05-01 DIAGNOSIS — I361 Nonrheumatic tricuspid (valve) insufficiency: Secondary | ICD-10-CM | POA: Diagnosis not present

## 2019-05-01 DIAGNOSIS — I071 Rheumatic tricuspid insufficiency: Secondary | ICD-10-CM

## 2019-05-01 NOTE — Telephone Encounter (Signed)
[address]       [place] [address]             [phone]     CARDIOTHORACIC SURGERY TELEPHONE VIRTUAL OFFICE NOTE  Referring Provider is No ref. provider found Primary Cardiologist is Glenetta Hew, MD PCP is [name], MD   HPI:  I spoke with Adriana Hendricks (DOB 1935/11/08 ) via telephone on 05/01/2019 at 10:45 AM and verified that I was speaking with the correct person using more than one form of identification.  We discussed the reason(s) for conducting our visit virtually instead of in-person.  The patient expressed understanding the circumstances and agreed to proceed as described.  Patient is an 84 year old female with history of hypertension and obstructive sleep apnea on home CPAP whoreturns to the office todaytofurtherdiscuss treatment options for management of persistent atrial fibrillation that has failed an attempt at DC cardioversion and severe tricuspid regurgitation. She was initially seen in consultation on January 07, 2019.  She subsequently underwent TEE and diagnostic cardiac catheterization and she was last seen here in our office on March 04, 2019.  At that time we made tentative plans for elective surgical intervention on March 21, 2019.  Her surgery was postponed because of the ongoing COVID-19 pandemic.  I telephoned the patient today to check to see how she is doing.  She complains that she has been gradually getting more short of breath and tired with exertion.  She thinks this may be related to stress.  She has not developed resting shortness of breath, orthopnea, abdominal swelling, nor lower extremity edema.  She has not had any chest pain or chest tightness.  She has not had any fever or cough.  She has not been exposed to any persons with known or suspected COVID-19 infection.     Current Outpatient Medications  Medication Sig Dispense Refill  . calcium carbonate (TUMS - DOSED IN MG ELEMENTAL CALCIUM) 500 MG chewable tablet  Chew 1 tablet by mouth daily.     . chlorthalidone (HYGROTON) 25 MG tablet Take 1 tablet (25 mg total) by mouth daily. 90 tablet 1  . cholecalciferol (VITAMIN D3) 25 MCG (1000 UT) tablet Take 1,000 Units by mouth daily.    . famotidine (PEPCID) 20 MG tablet Take 20 mg by mouth at bedtime.    . Multiple Vitamins-Minerals (ICAPS AREDS 2) CAPS Take 2 capsules by mouth daily.     . nadolol (CORGARD) 40 MG tablet Take 0.5 tablets (20 mg total) by mouth daily. (Patient taking differently: Take 40 mg by mouth daily. ) 903 tablet 3  . Omega-3 Fatty Acids (FISH OIL) 1200 MG CAPS Take 1,200 mg by mouth daily.    Marland Kitchen warfarin (COUMADIN) 5 MG tablet Take 1 tablet (5 mg total) by mouth every Tuesday, Thursday, and Saturday at 6 PM. Take 1/2 to 1 tablet daily as directed by coumadin clinic 90 tablet 1   No current facility-administered medications for this visit.      Diagnostic Tests:  n/a   Impression:  Patient has stage D severe symptomatic tricuspid regurgitation and persistent atrial fibrillation that has failed previous attempts at DC cardioversion.  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     Plan:  I discussed the patient's current clinical condition and possible timing for rescheduling elective surgical intervention at length  with the patient over the telephone.  She plans to discuss the timing of surgery further with her 2 daughters who will be assisting with her postoperative convalescence.  She does not wish to reschedule surgery at this time but she plans to keep her upcoming office appointment scheduled on May 27, 2019.  All of her questions been addressed.  The patient has been encouraged to continue to practice frequent handwashing and social distancing skills as much as possible.    I discussed limitations of evaluation and  management via telephone.  The patient was advised to call back for repeat telephone consultation or to seek an in-person evaluation if questions arise or the patient's clinical condition changes in any significant manner.  I spent in excess of 10 minutes of non-face-to-face time during the conduct of this telephone virtual office consultation.    Valentina Gu. Roxy Manns, MD 05/01/2019 10:45 AM

## 2019-05-02 ENCOUNTER — Telehealth: Payer: Self-pay | Admitting: Cardiology

## 2019-05-02 NOTE — Telephone Encounter (Signed)
Follow Up:; ° ° °Returning your call. °

## 2019-05-02 NOTE — Telephone Encounter (Signed)
Mychart, smartphone, pre reg complete 05/02/19 AF

## 2019-05-06 ENCOUNTER — Telehealth (INDEPENDENT_AMBULATORY_CARE_PROVIDER_SITE_OTHER): Payer: Medicare Other | Admitting: Cardiology

## 2019-05-06 ENCOUNTER — Encounter: Payer: Self-pay | Admitting: Cardiology

## 2019-05-06 ENCOUNTER — Telehealth: Payer: Self-pay | Admitting: *Deleted

## 2019-05-06 VITALS — BP 130/62 | HR 72 | Temp 97.0°F | Ht 65.0 in | Wt 168.0 lb

## 2019-05-06 DIAGNOSIS — I482 Chronic atrial fibrillation, unspecified: Secondary | ICD-10-CM

## 2019-05-06 DIAGNOSIS — I10 Essential (primary) hypertension: Secondary | ICD-10-CM

## 2019-05-06 DIAGNOSIS — R0609 Other forms of dyspnea: Secondary | ICD-10-CM

## 2019-05-06 DIAGNOSIS — I071 Rheumatic tricuspid insufficiency: Secondary | ICD-10-CM

## 2019-05-06 NOTE — Progress Notes (Signed)
Virtual Visit via Video Note   This visit type was conducted due to national recommendations for restrictions regarding the COVID-19 Pandemic (e.g. social distancing) in an effort to limit this patient's exposure and mitigate transmission in our community.  Due to her co-morbid illnesses, this patient is at least at moderate risk for complications without adequate follow up.  This format is felt to be most appropriate for this patient at this time.  All issues noted in this document were discussed and addressed.  A limited physical exam was performed with this format.  Please refer to the patient's chart for her consent to telehealth for [facility].   Patient has given verbal permission to conduct this visit via virtual appointment and to bill insurance 05/06/2019 3:18 PM     Evaluation Performed:  Follow-up visit  Date:  05/06/2019   ID:  Adriana Hendricks, DOB 10/17/1935, MRN [id number]  Patient Location: Home Provider Location: Home  PCP:  Marin Olp, MD  Cardiologist:  Glenetta Hew, MD  CT Surgeon: Dr. Darylene Price Electrophysiologist: Dr. Rayann Heman, [name], NP-A. fib clinic  Chief Complaint: 53-month follow-up; A. fib, severe TR  History of Present Illness:    Adriana Hendricks is a 84 y.o. female with PMH notable for chronic/persistent A. fib with severe tricuspid regurgitation and calcific/functional bicuspid aortic valve along with hypertension and OSA on CPAP who presents via audio/video conferencing for a telehealth visit today.  Adriana Hendricks was last seen by me in February 2020, she was just seen by Dr. Roxy Manns via telehealth visit on May 6 with plans for tricuspid valve repair and Maze procedure.  When I saw her she did not have any chest tightness or pressure, still noted irregular heartbeats and pretty significant dyspnea/fatigue.  No PND orthopnea but did have edema. --After March 9 clinic visit with Dr. Roxy Manns, the plan was to proceed with tricuspid valve repair and  Maze procedure.  This was postponed due to COVID-19 pandemic.  Follow-up visit with Dr. Roxy Manns on May 6 via telehealth suggested the patient was noting resting shortness of breath, orthopnea and abdominal swelling but no chest pain or tightness.  As her clinical condition seem to be worsening, they were working to reschedule her surgery.  She was given I discussed with her 2 daughters is defer the timing of surgery to allow for convalescent care.  Decisions will be made on June 1.  Interval History:  Adriana Hendricks says that she is indeed noticing a little more shortness of breath with activity than she had when I last saw her.  She notes it more when the weather is warmer.  For instance when the temperature was over 80, she noticed feeling a lot more short of breath than when it was in the 60s.  She is also noted that she is having to do a lot more "busy work "type activities since this COVID-19 quarantine issue.  She makes multiple trips back and forth to take care of groceries that are delivered or takeout food.  It then takes longer to prep things and get them stored.  This causes her to be much more short of breath than she usually would be. She does not have resting dyspnea.  She does not notice PND orthopnea.  No significant edema or abdominal fullness. She does not feel rapid irregular heartbeats or palpitations. Cardiovascular ROS: positive for - dyspnea on exertion and And fatigue negative for - chest pain, edema, irregular heartbeat, orthopnea, palpitations, paroxysmal nocturnal  dyspnea, rapid heart rate, shortness of breath or Syncope/near syncope, TIA shows amorous fugax.  Melena, hematochezia, hematuria or epistaxis.  Claudication.  The patient does not have symptoms concerning for COVID-19 infection (fever, chills, cough, or new shortness of breath).  The patient is practicing social distancing.  ROS:  Please see the history of present illness.    Review of Systems  Constitutional: Negative for  malaise/fatigue.  HENT: Positive for congestion (allergies). Negative for nosebleeds (with dry air -- starting to humidify).        Runny nose since March - asked about Flonase  Respiratory: Positive for shortness of breath (more troubled by pollen --> a bit more DOE than in Feb).   Gastrointestinal: Negative for blood in stool, diarrhea, heartburn, melena and nausea.  Genitourinary: Negative for hematuria.  Neurological: Negative for dizziness (only if beds down for a while), focal weakness, weakness and headaches.  Endo/Heme/Allergies: Positive for environmental allergies.  Psychiatric/Behavioral: Negative for memory loss. The patient is not nervous/anxious and does not have insomnia.   All other systems reviewed and are negative.   Past Medical History:  Diagnosis Date  . Achilles tendon rupture   . Arthritis of hand, right   . Carpal tunnel syndrome 09/04/2013  . Collagenous colitis 2005  . Colon polyps 2010   Tubular adenoma and hyperplastic  . Dental infection 10/25/2014  . Diverticulosis   . Esophageal stenosis   . Essential tremor   . GERD (gastroesophageal reflux disease)    hx hiatal hernia, hx esophagitis, hx stricture  . Heart murmur   . Hiatal hernia   . Hx: UTI (urinary tract infection)   . Hypertension   . Lower extremity neuropathy 08/23/2013   On B12 therapy with numbness in the feet bilaterally no evidence of diabetes   . Ocular migraine    jagged vision, a few per month  . OSA (obstructive sleep apnea) 12/21/2016   on CPAP  . Peripheral neuropathy    treated by Dr Posey Pronto (07/2015)  . Persistent atrial fibrillation    Rate control with beta-blocker and anticoagulated with warfarin  . Severe tricuspid regurgitation 07/2018   Noted Aug 2019 during a fib workup. Severe LAE as well   Past Surgical History:  Procedure Laterality Date  . 48 hr Holter Monitor  07/2018   Persistent Afib (rate 33 - 124 bpm)  . APPENDECTOMY    . CARDIOVERSION N/A 10/02/2018    Procedure: CARDIOVERSION;  Surgeon: Acie Fredrickson, Wonda Cheng, MD;  Location: [facility] ENDOSCOPY;  Service: Cardiovascular;  Laterality: N/A;  . CARPAL TUNNEL RELEASE    . CATARACT EXTRACTION    . EXCISION MORTON'S NEUROMA     Right foot  . INTRAOCULAR LENS IMPLANT, SECONDARY    . Moles removed    . MOUTH SURGERY     (For Exostosis)  . NUCLEAR  07/2018   EF 71 %. LOW RISK - no ischemia or Infarct.   Marland Kitchen RIGHT/LEFT HEART CATH AND CORONARY ANGIOGRAPHY N/A 01/16/2019   Procedure: RIGHT/LEFT HEART CATH AND CORONARY ANGIOGRAPHY;  Surgeon: Leonie Man, MD;  Location: [facility];; Angiographically normal coronary arteries.  Normal LVEDP. Upper Limit of Normal PAP~23 mmHg & PCWP 18 mmHg.  LVEDP of 7 mmHg. -With mean PAP of 23 mmHg there is a TPG suggesting a primary pulmonary etiology.  Marland Kitchen ROTATOR CUFF REPAIR    . TEE WITHOUT CARDIOVERSION N/A 01/16/2019   Procedure: TRANSESOPHAGEAL ECHOCARDIOGRAM (TEE);  Surgeon: Sueanne Margarita, MD;  Location: [facility] ENDOSCOPY;;  Severe TR due to poor coaptation of the leaflets from annular dilation.  Mild to moderate mitral vegetation with mildly restricted mobility of the posterior leaflet.  EF 55-60% with no R WMA.  Severe RA and LA dilation.  . TONSILLECTOMY    . TRANSTHORACIC ECHOCARDIOGRAM  07/2018   Normal LV Fxn (EF 60-65%) no RWMA.  Severe LA dilation & Severe TR.     Current Meds  Medication Sig  . calcium carbonate (TUMS - DOSED IN MG ELEMENTAL CALCIUM) 500 MG chewable tablet Chew 1 tablet by mouth daily.   . chlorthalidone (HYGROTON) 25 MG tablet Take 1 tablet (25 mg total) by mouth daily.  . cholecalciferol (VITAMIN D3) 25 MCG (1000 UT) tablet Take 1,000 Units by mouth daily.  . famotidine (PEPCID) 20 MG tablet Take 20 mg by mouth at bedtime.  . Multiple Vitamins-Minerals (ICAPS AREDS 2) CAPS Take 2 capsules by mouth daily.   . nadolol (CORGARD) 40 MG tablet Take 0.5 tablets (20 mg total) by mouth daily. (Patient taking differently: Take 40 mg by mouth daily.  )  . Omega-3 Fatty Acids (FISH OIL) 1000 MG CAPS Take 1,200 mg by mouth daily.   Marland Kitchen warfarin (COUMADIN) 5 MG tablet Take 1 tablet (5 mg total) by mouth every Tuesday, Thursday, and Saturday at 6 PM. Take 1/2 to 1 tablet daily as directed by coumadin clinic     Allergies:   Clarithromycin; Erythromycin base; Levofloxacin; Potassium chloride; Metronidazole; and Penicillins   Social History   Tobacco Use  . Smoking status: Former Smoker    Packs/day: 2.00    Years: 13.00    Pack years: 26.00    Types: Cigarettes    Start date: 12/26/1954    Last attempt to quit: 03/26/1968    Years since quitting: 51.1  . Smokeless tobacco: Never Used  . Tobacco comment: Quit in 1969 - smokeed 2-3PPD , was 3 PPD at her heaviest for about 3 years.   Substance Use Topics  . Alcohol use: Yes    Alcohol/week: 2.0 standard drinks    Types: 2 Glasses of wine per week    Comment: 2 glass of wine per day  . Drug use: No     Family Hx: The patient's family history includes Barrett's esophagus in her son; Heart failure in her father; Other in her mother; Rectal cancer in her father. There is no history of Colon cancer, Esophageal cancer, or Stomach cancer.   Prior CV studies:       01/16/2019  TEE: Severe TR due to poor coaptation of the leaflets from annular dilation.  Mild to moderate mitral vegetation with mildly restricted mobility of the posterior leaflet.  EF 55-60% with no R WMA.  Severe RA and LA dilation.  R&LHC: Angiographically normal coronary arteries.  Normal LVEDP. Normal Pulmonary Pressures. -- PAP ~23 mmHg & PCWP 18 mmHg.  LVEDP of 7 mmHg. -With mean PA pressure of 23 mmHg there is a transpulmonary gradient suggesting a primary pulmonary etiology.  Labs/Other Tests and Data Reviewed:    EKG:  No ECG reviewed.  Recent Labs: 09/18/2018: ALT 20; TSH 2.40 01/15/2019: BUN 23; Creatinine, Ser 0.82; Platelets 184 01/16/2019: Hemoglobin 12.9; Hemoglobin 11.9; Potassium 3.4; Potassium 3.2; Sodium 138;  Sodium 140   Recent Lipid Panel Lab Results  Component Value Date   CHOL 191 09/18/2018   HDL 53.70 09/18/2018   LDLCALC 116 (H) 09/18/2018   TRIG 106.0 09/18/2018   CHOLHDL 4 09/18/2018     Wt  Readings from Last 3 Encounters:  05/06/19 168 lb (76.2 kg)  03/04/19 169 lb (76.7 kg)  02/04/19 168 lb (76.2 kg)     Objective:    Vital Signs:  BP 130/62   Pulse 72   Temp (!) 97 F (36.1 C)   Ht 5\' 5"  (1.651 m)   Wt 168 lb (76.2 kg)   LMP  (LMP Unknown)   BMI 27.96 kg/m   VITAL SIGNS:  reviewed GEN:  Well nourished, well developed female in no acute distress. RESPIRATORY:  normal respiratory effort, symmetric expansion NEURO:  alert and oriented x 3, no obvious focal deficit PSYCH:  normal affect   ASSESSMENT & PLAN:    Problem List Items Addressed This Visit    Chronic atrial fibrillation - Primary (Chronic)   DOE (dyspnea on exertion) (Chronic)   Essential hypertension (Chronic)   Severe tricuspid regurgitation (Chronic)     Seems to be relatively stable from an A. fib standpoint.  Rate well controlled on beta-blocker. -She is on warfarin without any bleeding issues. (This patients CHA2DS2-VASc Score and unadjusted Ischemic Stroke Rate (% per year) is equal to 7.2 % stroke rate/year from a score of 5 Above score calculated as 1 point each if present [CHF, HTN, DM, Vascular=MI/PAD/Aortic Plaque, Age if 14-74, or Female]; Above score calculated as 2 points each if present [Age > 75, or Stroke/TIA/TE]  Most of her dyspnea seems to be related to A. fib in the setting of tricuspid regurgitation.  She is due to see Dr. Roxy Manns again in early June.  Major barrier for scheduling her surgery is her need for having assistance at home when she is postop in rehabilitation.  She needs help with caring for self but also her husband.  She would need help with groceries and cooking etc.   She is just needing to schedule this with her daughter and daughter-in-law to be potentially  available.  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  She clearly is not able to walk more than 100 feet without dyspnea.  We will assist her with filling out handicap placard paperwork.  Blood pressures well controlled.  No edema with chlorthalidone.  COVID-19 Education: The signs and symptoms of COVID-19 were discussed with the patient and how to seek care for testing (follow up with PCP or arrange E-visit).   The importance of social distancing was discussed today.  Time:   Today, I have spent 26 minutes with the patient with telehealth technology discussing the above problems.     Medication Adjustments/Labs and Tests Ordered: Current medicines are reviewed at length with the patient today.  Concerns regarding medicines are outlined above.  Medication Instructions:  No change to medications  Tests Ordered: No orders of the defined types were placed in this encounter. None  Medication Changes: No orders of the defined types were placed in this encounter. None  We will work on Acupuncturist.   Disposition:  Follow up in 4-5 month(s) -after valve surgery    Signed, Glenetta Hew, MD  05/06/2019 3:18 PM    [place]

## 2019-05-06 NOTE — Patient Instructions (Addendum)
Medication Instructions:  No change to medications  If you need a refill on your cardiac medications before your next appointment, please call your pharmacy.   Lab work:  Not needed  Testing/Procedures: Not needed  Follow-Up: At [facility], you and your health needs are our priority.  As part of our continuing mission to provide you with exceptional heart care, we have created designated Provider Care Teams.  These Care Teams include your primary Cardiologist (physician) and Advanced Practice Providers (APPs -  Physician Assistants and Nurse Practitioners) who all work together to provide you with the care you need, when you need it. . You will need a follow up appointment in in 4-5 month(s) -after valve surgery .  Please call our office 2 months in advance to schedule this appointment.  You may see Glenetta Hew, MD or one of the following Advanced Practice Providers on your designated Care Team:   . Rosaria Ferries, PA-C . Jory Sims, DNP, ANP  Any Other Special Instructions Will Be Listed Below (If Applicable).  We will work on handicap placard certification.(please bring by office)

## 2019-05-06 NOTE — Telephone Encounter (Signed)
Spoke to patient, instruction given. avs summary will be sent via  Mychart. Patient voiced understanding.

## 2019-05-10 ENCOUNTER — Telehealth: Payer: Self-pay

## 2019-05-10 NOTE — Telephone Encounter (Signed)
LMOM FOR PRESCREEN  

## 2019-05-10 NOTE — Telephone Encounter (Signed)

## 2019-05-13 ENCOUNTER — Ambulatory Visit (INDEPENDENT_AMBULATORY_CARE_PROVIDER_SITE_OTHER): Payer: Medicare Other | Admitting: *Deleted

## 2019-05-13 ENCOUNTER — Other Ambulatory Visit: Payer: Self-pay

## 2019-05-13 DIAGNOSIS — I4819 Other persistent atrial fibrillation: Secondary | ICD-10-CM

## 2019-05-13 DIAGNOSIS — Z7901 Long term (current) use of anticoagulants: Secondary | ICD-10-CM | POA: Diagnosis not present

## 2019-05-13 LAB — POCT INR: INR: 3.4 — AB (ref 2.0–3.0)

## 2019-05-13 NOTE — Patient Instructions (Addendum)
Description   Spoke with pt and instructed pt to hold today's dose then changed dose to  warfarin 5 mg Tuesdays and Saturdays, 2.5 mg all other days. [phone] Coumadin Clinic.  Repeat INR in 2 weeks.

## 2019-05-21 ENCOUNTER — Encounter: Payer: Self-pay | Admitting: *Deleted

## 2019-05-21 ENCOUNTER — Other Ambulatory Visit: Payer: Self-pay | Admitting: *Deleted

## 2019-05-21 DIAGNOSIS — I340 Nonrheumatic mitral (valve) insufficiency: Secondary | ICD-10-CM

## 2019-05-21 DIAGNOSIS — I4819 Other persistent atrial fibrillation: Secondary | ICD-10-CM

## 2019-05-21 DIAGNOSIS — I071 Rheumatic tricuspid insufficiency: Secondary | ICD-10-CM

## 2019-05-24 ENCOUNTER — Telehealth: Payer: Self-pay

## 2019-05-24 NOTE — Telephone Encounter (Signed)
lmom for prescreen  

## 2019-05-24 NOTE — Telephone Encounter (Signed)

## 2019-05-27 ENCOUNTER — Ambulatory Visit (INDEPENDENT_AMBULATORY_CARE_PROVIDER_SITE_OTHER): Payer: Medicare Other | Admitting: Thoracic Surgery (Cardiothoracic Vascular Surgery)

## 2019-05-27 ENCOUNTER — Encounter: Payer: Self-pay | Admitting: Thoracic Surgery (Cardiothoracic Vascular Surgery)

## 2019-05-27 ENCOUNTER — Other Ambulatory Visit: Payer: Self-pay

## 2019-05-27 VITALS — BP 150/96 | HR 97 | Temp 97.7°F | Resp 20 | Ht 65.0 in | Wt 169.0 lb

## 2019-05-27 DIAGNOSIS — Q231 Congenital insufficiency of aortic valve: Secondary | ICD-10-CM | POA: Diagnosis not present

## 2019-05-27 DIAGNOSIS — I2510 Atherosclerotic heart disease of native coronary artery without angina pectoris: Secondary | ICD-10-CM

## 2019-05-27 DIAGNOSIS — I071 Rheumatic tricuspid insufficiency: Secondary | ICD-10-CM

## 2019-05-27 DIAGNOSIS — I4819 Other persistent atrial fibrillation: Secondary | ICD-10-CM | POA: Diagnosis not present

## 2019-05-27 DIAGNOSIS — I340 Nonrheumatic mitral (valve) insufficiency: Secondary | ICD-10-CM | POA: Diagnosis not present

## 2019-05-27 NOTE — Progress Notes (Addendum)
[address]       [place] [address]             [phone]     CARDIOTHORACIC SURGERY OFFICE NOTE  Primary Cardiologist is Glenetta Hew, MD PCP is Marin Olp, MD   HPI:  Patient is an 84 year old female with history of hypertension and obstructive sleep apnea on home CPAP who returns to the office today for follow-up of severe tricuspid regurgitation, moderate mitral regurgitation, and recurrent persistent atrial fibrillation that had failed cardioversion, and chronic diastolic with right-sided congestive heart failure.  She was originally seen in consultation on January 07, 2019.  She was last seen here in our office on March 04, 2019 at which time we made tentative plans for elective tricuspid valve repair, Maze procedure, and possible mitral valve repair.  Plans for surgery were postponed because of the COVID-19 pandemic.  I last spoke with the patient over the telephone on May 01, 2019 who reported at that time that she remained reasonably stable although she had continued to experience slow gradual progression of symptoms of exertional shortness of breath and fatigue.  She returns to our office today with hopes to proceed with elective surgery as previously planned.  She denies resting shortness of breath or chest pain.  Weight is been stable.  She has not developed worsening lower extremity edema or abdominal bloating.  Appetite is stable.  She specifically denies any productive cough, fever, or other symptoms to suggest ongoing viral illness.  She has been very careful about social distancing and she has not traveled at all for the past few months.  She denies any known exposure to persons with known or suspected COVID-19 infection.   Current Outpatient Medications  Medication Sig Dispense Refill   B Complex Vitamins (B COMPLEX 100 PO) Take by mouth daily.     calcium carbonate (TUMS - DOSED IN MG ELEMENTAL CALCIUM) 500 MG chewable tablet Chew 1 tablet  by mouth daily.      chlorthalidone (HYGROTON) 25 MG tablet Take 1 tablet (25 mg total) by mouth daily. 90 tablet 1   cholecalciferol (VITAMIN D3) 25 MCG (1000 UT) tablet Take 1,000 Units by mouth daily.     famotidine (PEPCID) 20 MG tablet Take 20 mg by mouth at bedtime.     Multiple Vitamins-Minerals (ICAPS AREDS 2) CAPS Take 2 capsules by mouth daily.      nadolol (CORGARD) 40 MG tablet Take 0.5 tablets (20 mg total) by mouth daily. (Patient taking differently: Take 40 mg by mouth daily. ) 903 tablet 3   warfarin (COUMADIN) 5 MG tablet Take 1 tablet (5 mg total) by mouth every Tuesday, Thursday, and Saturday at 6 PM. Take 1/2 to 1 tablet daily as directed by coumadin clinic 90 tablet 1   No current facility-administered medications for this visit.       Physical Exam:   BP (!) 150/96    Pulse 97    Temp 97.7 F (36.5 C) (Skin)    Resp 20    Ht 5\' 5"  (1.651 m)    Wt 169 lb (76.7 kg)    LMP  (LMP Unknown)    SpO2 96% Comment: RA   BMI 28.12 kg/m   General:  Well-appearing  Chest:   Clear to auscultation  CV:   Irregular rate and rhythm  Incisions:  n/a  Abdomen:  Soft nontender  Extremities:  Warm and well-perfused  Diagnostic Tests:  n/a  Impression:  Patient has severe tricuspid regurgitation and persistent atrial fibrillation that has failed an attempt at DC cardioversion.She describes a history consistent with a long gradual progression of symptoms of exertional shortness of breath and fatigue. For the last several months the patient complains that she gets short of breath with low-level activity,consistent with symptoms of chronic diastolic congestive heart failure, New York Heart Association functional class III. At least some of the patient's symptoms of exertional shortness of breath may be related to physical deconditioning and decreased activity. She denies symptoms of lower extremity edema or abdominal bloating to suggest the presence of significant right-sided  heart failure. She does not experience any exertional chest pain or chest tightness.  I have personally reviewed the patient's recent transesophagealechocardiogram, diagnostic cardiac catheterization, and CT angiogram. The patienthasnormal left ventricular size and systolic function. There is severe left atrial enlargement andmild to moderatemitral regurgitation. There is annular dilatation with normal leaflet mobility. The leaflets are mildly thickened with myxomatous degenerative change. The aortic valve is bicuspid. There is no significant aortic stenosis or aortic insufficiency. The leaflets are thin and move well. There is severe tricuspid regurgitation. Right ventricular size and function appear essentially normal.The right atrium is severely dilated and there is severe annular enlargement of the tricuspid annulus. There appears to be normal leaflet mobility of the tricuspid valve with mild myxomatous change and no prolapse. There does not appear to be any significant left ventricular septal displacement or pulmonary hypertension.Diagnostic cardiac catheterization is notable for the absence of significant coronary artery disease. Right heart pressures and cardiac output were normal.  CT angiography revealed no contraindications to peripheral cannulation for surgery.  Options include continued medical therapy with long-term anticoagulation and rate control for management of atrial fibrillation with careful attention to medical therapy for congestive heart failure. Catheter-based ablation could be attempted, but I agree that the long-term likelihood of success would probably be relatively low because of the patient's advanced age, severe left atrial enlargement, and severe tricuspid regurgitation.  Alternatively, the patient could undergo tricuspid valve repair and Maze procedure.Concomitant mitral ring annuloplasty may be warranted at the time of surgery    Plan:  The  patient was again counseled at length regarding the indications, risks and potential benefits of tricuspid valve repair and maze procedure.  The rationale for elective surgery has been explained, including a comparison between surgery and continued medical therapy with close follow-up.  The relative risks and benefits of performing a maze procedure was discussed at length, including the expected likelihood of long term freedom from recurrent symptomatic atrial fibrillation and/or atrial flutter.  The patient additionally provides consent for long term follow up following surgery including participation in the [place].  The possibility that concomitant mitral annuloplasty might be indicated has been discussed.  Alternative surgical approaches have been discussed including a comparison between conventional sternotomy and minimally-invasive techniques.  The relative risks and benefits of each have been reviewed as they pertain to the patient's specific circumstances, and expectations for the patient's postoperative convalescence has been discussed.  The patient desires to proceed with surgery in the near future.  We tentatively plan to proceed with surgery on June 12, 2019.  The patient has been instructed to stop taking warfarin 7 days prior to surgery.  The patient understands and accepts all potential risks of surgery including but not limited to risk of death, stroke or other neurologic complication, myocardial infarction, congestive heart failure, respiratory failure, renal failure, bleeding requiring transfusion and/or reexploration,  arrhythmia, infection or other wound complications, pneumonia, pleural and/or pericardial effusion, pulmonary embolus, aortic dissection or other major vascular complication, or delayed complications related to valve repair or replacement including but not limited to structural valve deterioration and failure, thrombosis, embolization, endocarditis, or paravalvular leak.   Specific risks potentially related to the minimally-invasive approach were discussed at length, including but not limited to risk of conversion to full or partial sternotomy, aortic dissection or other major vascular complication, unilateral acute lung injury or pulmonary edema, phrenic nerve dysfunction or paralysis, rib fracture, chronic pain, lung hernia, or lymphocele. All of their questions have been answered.    I spent in excess of 15 minutes during the conduct of this office consultation and >50% of this time involved direct face-to-face encounter with the patient for counseling and/or coordination of their care.    Valentina Gu. Roxy Manns, MD 05/27/2019 3:25 PM

## 2019-05-27 NOTE — Patient Instructions (Addendum)
Stop taking Coumadin (warfarin) at least 7 days prior to surgery  Continue taking all other medications without change through the day before surgery.  Have nothing to eat or drink after midnight the night before surgery.  On the morning of surgery do not take any medications

## 2019-05-28 ENCOUNTER — Ambulatory Visit (INDEPENDENT_AMBULATORY_CARE_PROVIDER_SITE_OTHER): Payer: Medicare Other | Admitting: Pharmacist Clinician (PhC)/ Clinical Pharmacy Specialist

## 2019-05-28 DIAGNOSIS — Z7901 Long term (current) use of anticoagulants: Secondary | ICD-10-CM | POA: Diagnosis not present

## 2019-05-28 DIAGNOSIS — I4819 Other persistent atrial fibrillation: Secondary | ICD-10-CM | POA: Diagnosis not present

## 2019-05-28 LAB — POCT INR: INR: 2.4 (ref 2.0–3.0)

## 2019-05-28 NOTE — Patient Instructions (Signed)
Continue with warfarin 5 mg Tuesdays and Saturdays, 2.5 mg all other days. Call when re-start after procedure and we will re-scheudule at that time [phone] [facility]madin [facility].  Last dose Tuesday June 9.

## 2019-06-07 ENCOUNTER — Other Ambulatory Visit (HOSPITAL_COMMUNITY): Payer: Medicare Other

## 2019-06-07 NOTE — Pre-Procedure Instructions (Signed)
[place], [place] - [address] [place] [place] [address] Phone: [phone] Fax: [phone]  [facility] [place] [address] [place] [address] Phone: [phone] Fax: [phone]      Your procedure is scheduled on Wednesday, June 17th.  Report to [facility] Main Entrance "A" at 6:30 A.M., and check in at the Admitting office.  Call this number if you have problems the morning of surgery:  [phone]  Call [phone] if you have any questions prior to your surgery date Monday-Friday 8am-4pm    Remember:  Do not eat or drink after midnight.    Take these medicines the morning of surgery with A SIP OF WATER  nadolol (CORGARD)  Eye drops if needed  Per Dr. Roxy Manns, hold warfarin (COUMADIN) for 7 days prior to surgery.   As of today, STOP taking any Aspirin (unless otherwise instructed by your surgeon), Aleve, Naproxen, Ibuprofen, Motrin, Advil, Goody's, BC's, all herbal medications, fish oil, and all vitamins.    The Morning of Surgery  Do not wear jewelry, make-up or nail polish.  Do not wear lotions, powders, or perfumes/colognes, or deodorant  Do not shave 48 hours prior to surgery.  Men may shave face and neck.  Do not bring valuables to the hospital.  [facility] is not responsible for any belongings or valuables.  If you are a smoker, DO NOT Smoke 24 hours prior to surgery IF you wear a CPAP at night please bring your mask, tubing, and machine the morning of surgery   Remember that you must have someone to transport you home after your surgery, and remain with you for 24 hours if you are discharged the same day.   Contacts, glasses, hearing aids, dentures or bridgework may not be worn into surgery.    Leave your suitcase in the car.  After surgery it may be brought to your room.  For patients admitted to  the hospital, discharge time will be determined by your treatment team.  Patients discharged the day of surgery will not be allowed to drive home.    Special instructions:   [place]- Preparing For Surgery  Before surgery, you can play an important role. Because skin is not sterile, your skin needs to be as free of germs as possible. You can reduce the number of germs on your skin by washing with CHG (chlorahexidine gluconate) Soap before surgery.  CHG is an antiseptic cleaner which kills germs and bonds with the skin to continue killing germs even after washing.    Oral Hygiene is also important to reduce your risk of infection.  Remember - BRUSH YOUR TEETH THE MORNING OF SURGERY WITH YOUR REGULAR TOOTHPASTE  Please do not use if you have an allergy to CHG or antibacterial soaps. If your skin becomes reddened/irritated stop using the CHG.  Do not shave (including legs and underarms) for at least 48 hours prior to first CHG shower. It is OK to shave your face.  Please follow these instructions carefully.   1. Shower the NIGHT BEFORE SURGERY and the MORNING OF SURGERY with CHG Soap.   2. If you chose to wash your hair, wash your hair first as usual with your normal shampoo.  3. After you shampoo, rinse your hair and body thoroughly to remove the shampoo.  4. Use CHG as you would any other liquid soap. You can  apply CHG directly to the skin and wash gently with a scrungie or a clean washcloth.   5. Apply the CHG Soap to your body ONLY FROM THE NECK DOWN.  Do not use on open wounds or open sores. Avoid contact with your eyes, ears, mouth and genitals (private parts). Wash Face and genitals (private parts)  with your normal soap.   6. Wash thoroughly, paying special attention to the area where your surgery will be performed.  7. Thoroughly rinse your body with warm water from the neck down.  8. DO NOT shower/wash with your normal soap after using and rinsing off the CHG Soap.  9. Pat  yourself dry with a CLEAN TOWEL.  10. Wear CLEAN PAJAMAS to bed the night before surgery, wear comfortable clothes the morning of surgery  11. Place CLEAN SHEETS on your bed the night of your first shower and DO NOT SLEEP WITH PETS.    Day of Surgery:  Do not apply any deodorants/lotions.  Please wear clean clothes to the hospital/surgery center.   Remember to brush your teeth WITH YOUR REGULAR TOOTHPASTE.   Please read over the following fact sheets that you were given.

## 2019-06-08 ENCOUNTER — Other Ambulatory Visit (HOSPITAL_COMMUNITY)
Admission: RE | Admit: 2019-06-08 | Discharge: 2019-06-08 | Disposition: A | Discharge status: Home / Self Care | Payer: Medicare Other | Source: Ambulatory Visit | Attending: Thoracic Surgery (Cardiothoracic Vascular Surgery) | Admitting: Thoracic Surgery (Cardiothoracic Vascular Surgery)

## 2019-06-08 DIAGNOSIS — I4819 Other persistent atrial fibrillation: Secondary | ICD-10-CM

## 2019-06-08 DIAGNOSIS — I340 Nonrheumatic mitral (valve) insufficiency: Secondary | ICD-10-CM

## 2019-06-08 DIAGNOSIS — Z1159 Encounter for screening for other viral diseases: Secondary | ICD-10-CM | POA: Insufficient documentation

## 2019-06-08 DIAGNOSIS — I071 Rheumatic tricuspid insufficiency: Secondary | ICD-10-CM

## 2019-06-10 ENCOUNTER — Inpatient Hospital Stay (HOSPITAL_COMMUNITY): Admission: RE | Admit: 2019-06-10 | Payer: Medicare Other | Source: Ambulatory Visit

## 2019-06-10 ENCOUNTER — Encounter (HOSPITAL_COMMUNITY): Payer: Self-pay

## 2019-06-10 ENCOUNTER — Encounter (HOSPITAL_COMMUNITY)
Admission: RE | Admit: 2019-06-10 | Discharge: 2019-06-10 | Disposition: A | Discharge status: Home / Self Care | Payer: Medicare Other | Source: Ambulatory Visit | Attending: Thoracic Surgery (Cardiothoracic Vascular Surgery) | Admitting: Thoracic Surgery (Cardiothoracic Vascular Surgery)

## 2019-06-10 ENCOUNTER — Other Ambulatory Visit: Payer: Self-pay

## 2019-06-10 ENCOUNTER — Ambulatory Visit (HOSPITAL_BASED_OUTPATIENT_CLINIC_OR_DEPARTMENT_OTHER)
Admission: RE | Admit: 2019-06-10 | Discharge: 2019-06-10 | Disposition: A | Discharge status: Home / Self Care | Payer: Medicare Other | Source: Ambulatory Visit | Attending: Thoracic Surgery (Cardiothoracic Vascular Surgery) | Admitting: Thoracic Surgery (Cardiothoracic Vascular Surgery)

## 2019-06-10 ENCOUNTER — Ambulatory Visit (HOSPITAL_COMMUNITY)
Admission: RE | Admit: 2019-06-10 | Discharge: 2019-06-10 | Disposition: A | Discharge status: Home / Self Care | Payer: Medicare Other | Source: Ambulatory Visit | Attending: Thoracic Surgery (Cardiothoracic Vascular Surgery) | Admitting: Thoracic Surgery (Cardiothoracic Vascular Surgery)

## 2019-06-10 DIAGNOSIS — E876 Hypokalemia: Secondary | ICD-10-CM | POA: Diagnosis not present

## 2019-06-10 DIAGNOSIS — E785 Hyperlipidemia, unspecified: Secondary | ICD-10-CM | POA: Diagnosis not present

## 2019-06-10 DIAGNOSIS — D62 Acute posthemorrhagic anemia: Secondary | ICD-10-CM | POA: Diagnosis not present

## 2019-06-10 DIAGNOSIS — I495 Sick sinus syndrome: Secondary | ICD-10-CM | POA: Diagnosis not present

## 2019-06-10 DIAGNOSIS — I4891 Unspecified atrial fibrillation: Secondary | ICD-10-CM | POA: Diagnosis not present

## 2019-06-10 DIAGNOSIS — I071 Rheumatic tricuspid insufficiency: Secondary | ICD-10-CM

## 2019-06-10 DIAGNOSIS — I4819 Other persistent atrial fibrillation: Secondary | ICD-10-CM | POA: Diagnosis not present

## 2019-06-10 DIAGNOSIS — I442 Atrioventricular block, complete: Secondary | ICD-10-CM | POA: Diagnosis not present

## 2019-06-10 DIAGNOSIS — I340 Nonrheumatic mitral (valve) insufficiency: Secondary | ICD-10-CM | POA: Insufficient documentation

## 2019-06-10 DIAGNOSIS — G4733 Obstructive sleep apnea (adult) (pediatric): Secondary | ICD-10-CM | POA: Diagnosis not present

## 2019-06-10 DIAGNOSIS — I5033 Acute on chronic diastolic (congestive) heart failure: Secondary | ICD-10-CM | POA: Diagnosis not present

## 2019-06-10 DIAGNOSIS — K219 Gastro-esophageal reflux disease without esophagitis: Secondary | ICD-10-CM | POA: Diagnosis not present

## 2019-06-10 DIAGNOSIS — I110 Hypertensive heart disease with heart failure: Secondary | ICD-10-CM | POA: Diagnosis not present

## 2019-06-10 DIAGNOSIS — I081 Rheumatic disorders of both mitral and tricuspid valves: Secondary | ICD-10-CM | POA: Diagnosis not present

## 2019-06-10 DIAGNOSIS — D6959 Other secondary thrombocytopenia: Secondary | ICD-10-CM | POA: Diagnosis not present

## 2019-06-10 LAB — NOVEL CORONAVIRUS, NAA (HOSP ORDER, SEND-OUT TO REF LAB; TAT 18-24 HRS): SARS-CoV-2, NAA: NOT DETECTED

## 2019-06-10 LAB — COMPREHENSIVE METABOLIC PANEL
ALT: 25 U/L (ref 0–44)
AST: 42 U/L — ABNORMAL HIGH (ref 15–41)
Albumin: 4.1 g/dL (ref 3.5–5.0)
Alkaline Phosphatase: 82 U/L (ref 38–126)
Anion gap: 14 (ref 5–15)
BUN: 15 mg/dL (ref 8–23)
CO2: 20 mmol/L — ABNORMAL LOW (ref 22–32)
Calcium: 9.3 mg/dL (ref 8.9–10.3)
Chloride: 98 mmol/L (ref 98–111)
Creatinine, Ser: 0.75 mg/dL (ref 0.44–1.00)
GFR calc Af Amer: >60 mL/min (ref >60)
GFR calc non Af Amer: >60 mL/min (ref >60)
Glucose, Bld: 117 mg/dL — ABNORMAL HIGH (ref 70–99)
Potassium: 4.3 mmol/L (ref 3.5–5.1)
Sodium: 132 mmol/L — ABNORMAL LOW (ref 135–145)
Total Bilirubin: 1.6 mg/dL — ABNORMAL HIGH (ref 0.3–1.2)
Total Protein: 7.1 g/dL (ref 6.5–8.1)

## 2019-06-10 LAB — SURGICAL PCR SCREEN
MRSA, PCR: NEGATIVE
Staphylococcus aureus: NEGATIVE

## 2019-06-10 LAB — URINALYSIS, ROUTINE W REFLEX MICROSCOPIC
Bilirubin Urine: NEGATIVE
Glucose, UA: NEGATIVE mg/dL
Hgb urine dipstick: NEGATIVE
Ketones, ur: NEGATIVE mg/dL
Nitrite: NEGATIVE
Protein, ur: NEGATIVE mg/dL
Specific Gravity, Urine: 1.011 (ref 1.005–1.030)
pH: 7 (ref 5.0–8.0)

## 2019-06-10 LAB — BLOOD GAS, ARTERIAL
Acid-Base Excess: 0.5 mmol/L (ref 0.0–2.0)
Bicarbonate: 24.5 mmol/L (ref 20.0–28.0)
Drawn by: 470591
FIO2: 21
O2 Saturation: 98.5 %
Patient temperature: 98.6
pCO2 arterial: 38.6 mmHg (ref 32.0–48.0)
pH, Arterial: 7.419 (ref 7.350–7.450)
pO2, Arterial: 130 mmHg — ABNORMAL HIGH (ref 83.0–108.0)

## 2019-06-10 LAB — CBC
HCT: 43.1 % (ref 36.0–46.0)
Hemoglobin: 14.6 g/dL (ref 12.0–15.0)
MCH: 32.7 pg (ref 26.0–34.0)
MCHC: 33.9 g/dL (ref 30.0–36.0)
MCV: 96.4 fL (ref 80.0–100.0)
Platelets: 191 10*3/uL (ref 150–400)
RBC: 4.47 MIL/uL (ref 3.87–5.11)
RDW: 12.3 % (ref 11.5–15.5)
WBC: 7.4 10*3/uL (ref 4.0–10.5)
nRBC: 0 % (ref 0.0–0.2)

## 2019-06-10 LAB — TYPE AND SCREEN
ABO/RH(D): A POS
Antibody Screen: NEGATIVE

## 2019-06-10 LAB — PROTIME-INR
INR: 1.2 (ref 0.8–1.2)
Prothrombin Time: 15.3 seconds — ABNORMAL HIGH (ref 11.4–15.2)

## 2019-06-10 LAB — ABO/RH: ABO/RH(D): A POS

## 2019-06-10 LAB — HEMOGLOBIN A1C
Hgb A1c MFr Bld: 5.4 % (ref 4.8–5.6)
Mean Plasma Glucose: 108.28 mg/dL

## 2019-06-10 LAB — APTT: aPTT: 33 seconds (ref 24–36)

## 2019-06-10 NOTE — Progress Notes (Signed)
Pre op duplex studies       have been completed. Preliminary results can be found under CV proc through chart review. June Leap, BS, RDMS, RVT

## 2019-06-10 NOTE — Progress Notes (Signed)
Left a message for Thurmond Butts, RN from Dr. Guy Sandifer office regarding the UA result.

## 2019-06-10 NOTE — Progress Notes (Addendum)
PCP - Dr. Yong Channel  Cardiologist - Dr. [name]  Chest x-ray - 06/10/2019  EKG - 06/10/2019  Stress Test - 08/01/18 (E)  ECHO - 01/16/2019 (E)  Cardiac Cath - 01/16/2019 (E)  AICD-na PM-na LOOP-na  Sleep Study - Yes- Positive CPAP - Yes- pt bring only the mask/hose, pt sts it's too much to bring her whole machine.  LABS- 06/10/2019: CBC, CMP, PT, PTT, ABG, T/S, UA, PCR  ASA- Denies Coumadin- LD- 6/9  ERAS- No  HA1C- 06/10/2019   Anesthesia- Yes- cardiac history  Pt denies having chest pain, sob, or fever at this time. All instructions explained to the pt, with a verbal understanding of the material. Pt agrees to go over the instructions while at home for a better understanding. The opportunity to ask questions was provided.   Coronavirus Screening  Have you experienced the following symptoms:  Cough yes/no: No Fever (>100.29F)  yes/no: No Runny nose yes/no: No Sore throat yes/no: No Difficulty breathing/shortness of breath  yes/no: No  Have you or a family member traveled in the last 14 days and where? yes/no: No   If the patient indicates "YES" to the above questions, their PAT will be rescheduled to limit the exposure to others and, the surgeon will be notified. THE PATIENT WILL NEED TO BE ASYMPTOMATIC FOR 14 DAYS.   If the patient is not experiencing any of these symptoms, the PAT nurse will instruct them to NOT bring anyone with them to their appointment since they may have these symptoms or traveled as well.   Please remind your patients and families that hospital visitation restrictions are in effect and the importance of the restrictions.

## 2019-06-11 MED ORDER — PLASMA-LYTE 148 IV SOLN
INTRAVENOUS | Status: AC
  Administered 2019-06-12: 500 mL
  Filled 2019-06-11 (×2): qty 2.5

## 2019-06-11 MED ORDER — TRANEXAMIC ACID 1000 MG/10ML IV SOLN
1.5000 mg/kg/h | INTRAVENOUS | Status: DC
  Filled 2019-06-11 (×2): qty 25

## 2019-06-11 MED ORDER — SODIUM CHLORIDE 0.9 % IV SOLN
750.0000 mg | INTRAVENOUS | Status: DC
  Filled 2019-06-11 (×2): qty 750

## 2019-06-11 MED ORDER — MILRINONE LACTATE IN DEXTROSE 20-5 MG/100ML-% IV SOLN
0.3000 ug/kg/min | INTRAVENOUS | Status: DC
  Filled 2019-06-11 (×2): qty 100

## 2019-06-11 MED ORDER — TRANEXAMIC ACID (OHS) PUMP PRIME SOLUTION
2.0000 mg/kg | INTRAVENOUS | Status: DC
  Filled 2019-06-11 (×2): qty 1.53

## 2019-06-11 MED ORDER — PHENYLEPHRINE HCL-NACL 20-0.9 MG/250ML-% IV SOLN
30.0000 ug/min | INTRAVENOUS | Status: DC
  Filled 2019-06-11 (×2): qty 250

## 2019-06-11 MED ORDER — DEXMEDETOMIDINE HCL IN NACL 400 MCG/100ML IV SOLN
0.1000 ug/kg/h | INTRAVENOUS | Status: DC
  Filled 2019-06-11 (×2): qty 100

## 2019-06-11 MED ORDER — GLUTARALDEHYDE 0.625% SOAKING SOLUTION
TOPICAL | Status: DC | PRN
  Administered 2019-06-12: 1 via TOPICAL
  Filled 2019-06-11: qty 50

## 2019-06-11 MED ORDER — NITROGLYCERIN IN D5W 200-5 MCG/ML-% IV SOLN
2.0000 ug/min | INTRAVENOUS | Status: DC
  Filled 2019-06-11 (×2): qty 250

## 2019-06-11 MED ORDER — MAGNESIUM SULFATE 50 % IJ SOLN
40.0000 meq | INTRAMUSCULAR | Status: DC
  Filled 2019-06-11 (×2): qty 9.85

## 2019-06-11 MED ORDER — POTASSIUM CHLORIDE 2 MEQ/ML IV SOLN
80.0000 meq | INTRAVENOUS | Status: DC
  Filled 2019-06-11 (×2): qty 40

## 2019-06-11 MED ORDER — TRANEXAMIC ACID (OHS) BOLUS VIA INFUSION
15.0000 mg/kg | INTRAVENOUS | Status: AC
  Administered 2019-06-12: 1147.5 mg via INTRAVENOUS
  Filled 2019-06-11: qty 1148

## 2019-06-11 MED ORDER — KENNESTONE BLOOD CARDIOPLEGIA VIAL
13.0000 mL | Status: DC
  Filled 2019-06-11 (×2): qty 13

## 2019-06-11 MED ORDER — SODIUM CHLORIDE 0.9 % IV SOLN
INTRAVENOUS | Status: DC
  Filled 2019-06-11 (×2): qty 30

## 2019-06-11 MED ORDER — VANCOMYCIN HCL 1000 MG IV SOLR
INTRAVENOUS | Status: AC
  Administered 2019-06-12: 1000 mL
  Filled 2019-06-11 (×2): qty 1000

## 2019-06-11 MED ORDER — EPINEPHRINE PF 1 MG/ML IJ SOLN
0.0000 ug/min | INTRAVENOUS | Status: DC
  Filled 2019-06-11 (×2): qty 4

## 2019-06-11 MED ORDER — KENNESTONE BLOOD CARDIOPLEGIA (KBC) MANNITOL SYRINGE (20%, 32ML)
32.0000 mL | INTRAVENOUS | Status: DC
  Filled 2019-06-11 (×3): qty 32

## 2019-06-11 MED ORDER — NOREPINEPHRINE 4 MG/250ML-% IV SOLN
0.0000 ug/min | INTRAVENOUS | Status: DC
  Filled 2019-06-11 (×2): qty 250

## 2019-06-11 MED ORDER — INSULIN REGULAR(HUMAN) IN NACL 100-0.9 UT/100ML-% IV SOLN
INTRAVENOUS | Status: DC
  Filled 2019-06-11 (×2): qty 100

## 2019-06-11 MED ORDER — DOPAMINE-DEXTROSE 3.2-5 MG/ML-% IV SOLN
0.0000 ug/kg/min | INTRAVENOUS | Status: DC
  Filled 2019-06-11 (×2): qty 250

## 2019-06-11 MED ORDER — SODIUM CHLORIDE 0.9 % IV SOLN
1.5000 g | INTRAVENOUS | Status: AC
  Administered 2019-06-12: .75 g via INTRAVENOUS
  Administered 2019-06-12: 1.5 g via INTRAVENOUS
  Filled 2019-06-11 (×2): qty 1.5

## 2019-06-11 MED ORDER — VANCOMYCIN HCL 10 G IV SOLR
1250.0000 mg | INTRAVENOUS | Status: AC
  Administered 2019-06-12: 1250 mg via INTRAVENOUS
  Filled 2019-06-11 (×2): qty 1250

## 2019-06-11 NOTE — H&P (Signed)
[address]       [place] [address]             [id number]          CARDIOTHORACIC SURGERY HISTORY AND PHYSICAL EXAM  Referring Provider is Glenetta Hew, MD PCP is Marin Olp, MD      Chief Complaint  Patient presents with   Tricuspid Regurgitation    Surgical eval, CARDIOVERSION 10/02/18, ECHO 08/02/18,    Atrial Fibrillation    HPI:  Patient is an 84 year old female with history of hypertension and obstructive sleep apnea on home CPAP who has been referred for surgical consultation to discuss treatment options for management of persistent atrial fibrillation that has failed an attempt at DC cardioversion and severe tricuspid regurgitation.  Patient states that she was told she had a leaky mitral valve many years ago.  She otherwise reports no significant cardiac history.  She describes a several year history of gradual progression of symptoms of exertional shortness of breath.  She was diagnosed with obstructive sleep apnea and has been followed for several years by Dr. Halford Chessman.  She was seen in follow-up last summer and noted to be in atrial fibrillation.  She was referred for cardiac evaluation and has been followed carefully ever since by Dr. [name].  Baseline transthoracic echocardiogram performed August 02, 2018 revealed normal left ventricular size and systolic function with ejection fraction estimated 60 to 65%.  There was mild mitral regurgitation.  There was severe left atrial enlargement.  There was severe tricuspid regurgitation.  Right ventricular size and function was felt to be normal.  A nuclear stress test was performed and felt to be low risk.  Baseline ejection fraction was estimated 71%.  There were no ST segment changes during stress.   A 48-hour Holter monitor was performed revealing persistent atrial fibrillation.  The patient had no evidence for any episodes of sinus rhythm.  There were occasional PVCs.  The patient was  anticoagulated using warfarin.  She underwent elective DC cardioversion December 02, 2018.  She initially was converted into sinus rhythm but she promptly returned to atrial fibrillation.  She was seen in follow-up in the atrial fibrillation clinic and felt to be relatively poor candidate for catheter-based ablation due to her age, the severity of left atrial enlargement, and the presence of severe tricuspid regurgitation.  She was seen in follow-up recently by Dr. [name] and has now been referred for elective surgical consultation.  Patient is married and lives locally in [place] with her husband.  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  She has never exercised on a regular basis.  She describes a long history of exertional shortness of breath that probably began at least 8 or 10 years ago.  Over the last 6 months or so symptoms have progressed.  She now gets short of breath quite easily, such as just walking 6 or 8 steps at a brisk pace.  She denies any resting shortness of breath.  She denies any history of PND, orthopnea, or lower extremity edema.  She has occasional palpitations and dizzy spells.  She has never had any syncopal episodes.  She denies any history of chest pain or chest tightness either with activity or at rest.  Patient is an 84 year old female with history of hypertension and obstructive sleep apnea on home CPAP who returns to the office  today for follow-up of severe tricuspid regurgitation, moderate mitral regurgitation, and recurrent persistent atrial fibrillation that had failed cardioversion, and chronic diastolic with right-sided congestive heart failure.  She was originally seen in consultation on January 07, 2019.  She was last seen here in our office on March 04, 2019 at which time we made tentative plans for elective tricuspid valve repair, Maze procedure, and possible  mitral valve repair.  Plans for surgery were postponed because of the COVID-19 pandemic.  I last spoke with the patient over the telephone on May 01, 2019 who reported at that time that she remained reasonably stable although she had continued to experience slow gradual progression of symptoms of exertional shortness of breath and fatigue.  She returns to our office today with hopes to proceed with elective surgery as previously planned.  She denies resting shortness of breath or chest pain.  Weight is been stable.  She has not developed worsening lower extremity edema or abdominal bloating.  Appetite is stable.  She specifically denies any productive cough, fever, or other symptoms to suggest ongoing viral illness.  She has been very careful about social distancing and she has not traveled at all for the past few months.  She denies any known exposure to persons with known or suspected COVID-19 infection.  Past Medical History:  Diagnosis Date   Achilles tendon rupture    Arthritis of hand, right    Carpal tunnel syndrome 09/04/2013   Collagenous colitis 2005   Colon polyps 2010   Tubular adenoma and hyperplastic   Dental infection 10/25/2014   Diverticulosis    Esophageal stenosis    Essential tremor    GERD (gastroesophageal reflux disease)    hx hiatal hernia, hx esophagitis, hx stricture   Heart murmur    Hiatal hernia    Hx: UTI (urinary tract infection)    Hypertension    Lower extremity neuropathy 08/23/2013   On B12 therapy with numbness in the feet bilaterally no evidence of diabetes    Ocular migraine    jagged vision, a few per month   OSA (obstructive sleep apnea) 12/21/2016   on CPAP   Peripheral neuropathy    treated by Dr Posey Pronto (07/2015)   Persistent atrial fibrillation    Rate control with beta-blocker and anticoagulated with warfarin   Severe tricuspid regurgitation 07/2018   Noted Aug 2019 during a fib workup. Severe LAE as well    Past Surgical  History:  Procedure Laterality Date   48 hr Holter Monitor  07/2018   Persistent Afib (rate 33 - 124 bpm)   APPENDECTOMY     CARDIAC CATHETERIZATION     CARDIOVERSION N/A 10/02/2018   Procedure: CARDIOVERSION;  Surgeon: Nahser, Wonda Cheng, MD;  Location: [facility];  Service: Cardiovascular;  Laterality: N/A;   CARPAL TUNNEL RELEASE     CATARACT EXTRACTION     EXCISION MORTON'S NEUROMA     Right foot   EYE SURGERY     Cataracts with implants-bilateral   INTRAOCULAR LENS IMPLANT, SECONDARY     Moles removed     MOUTH SURGERY     (For Exostosis)   NUCLEAR  07/2018   EF 71 %. LOW RISK - no ischemia or Infarct.    RIGHT/LEFT HEART CATH AND CORONARY ANGIOGRAPHY N/A 01/16/2019   Procedure: RIGHT/LEFT HEART CATH AND CORONARY ANGIOGRAPHY;  Surgeon: Leonie Man, MD;  Location: [facility];; Angiographically normal coronary arteries.  Normal LVEDP. Upper Limit of Normal PAP~23 mmHg &  PCWP 18 mmHg.  LVEDP of 7 mmHg. -With mean PAP of 23 mmHg there is a TPG suggesting a primary pulmonary etiology.   ROTATOR CUFF REPAIR     TEE WITHOUT CARDIOVERSION N/A 01/16/2019   Procedure: TRANSESOPHAGEAL ECHOCARDIOGRAM (TEE);  Surgeon: Sueanne Margarita, MD;  Location: [facility] ENDOSCOPY;;  Severe TR due to poor coaptation of the leaflets from annular dilation.  Mild to moderate mitral vegetation with mildly restricted mobility of the posterior leaflet.  EF 55-60% with no R WMA.  Severe RA and LA dilation.   TONSILLECTOMY     TRANSTHORACIC ECHOCARDIOGRAM  07/2018   Normal LV Fxn (EF 60-65%) no RWMA.  Severe LA dilation & Severe TR.    Family History  Problem Relation Age of Onset   Rectal cancer Father        colostomy   Heart failure Father        Died, 76   Other Mother        Died, 24. old age.    Barrett's esophagus Son    Colon cancer Neg Hx    Esophageal cancer Neg Hx    Stomach cancer Neg Hx     Social History Social History   Tobacco Use   Smoking status:  Former Smoker    Packs/day: 2.00    Years: 13.00    Pack years: 26.00    Types: Cigarettes    Start date: 12/26/1954    Quit date: 03/26/1968    Years since quitting: 51.2   Smokeless tobacco: Never Used   Tobacco comment: Quit in 1969 - smokeed 2-3PPD , was 3 PPD at her heaviest for about 3 years.   Substance Use Topics   Alcohol use: Yes    Alcohol/week: 2.0 standard drinks    Types: 2 Glasses of wine per week    Comment: 2 glass of wine per day   Drug use: No    Prior to Admission medications   Medication Sig Start Date End Date Taking? Authorizing Provider  B Complex Vitamins (B COMPLEX 100 PO) Take 1 tablet by mouth daily.    Yes [provider]  calcium carbonate (TUMS - DOSED IN MG ELEMENTAL CALCIUM) 500 MG chewable tablet Chew 1 tablet by mouth daily.    Yes [provider]  chlorthalidone (HYGROTON) 25 MG tablet Take 1 tablet (25 mg total) by mouth daily. 04/10/19  Yes Leonie Man, MD  cholecalciferol (VITAMIN D3) 25 MCG (1000 UT) tablet Take 1,000 Units by mouth daily.   Yes [provider]  famotidine (PEPCID) 20 MG tablet Take 20 mg by mouth at bedtime.   Yes [provider]  hydroxypropyl methylcellulose / hypromellose (ISOPTO TEARS / GONIOVISC) 2.5 % ophthalmic solution Place 1 drop into both eyes 4 (four) times daily as needed for dry eyes.   Yes [provider]  Multiple Vitamins-Minerals (PRESERVISION AREDS PO) Take 2 tablets by mouth daily.   Yes [provider]  nadolol (CORGARD) 40 MG tablet Take 0.5 tablets (20 mg total) by mouth daily. Patient taking differently: Take 40 mg by mouth daily.  10/02/18  Yes Nahser, Wonda Cheng, MD  Multiple Vitamins-Minerals (PRESERVISION AREDS 2 PO) Take 2 tablets by mouth 2 (two) times a day.    [provider]  warfarin (COUMADIN) 5 MG tablet Take 1 tablet (5 mg total) by mouth every Tuesday, Thursday, and Saturday at 6 PM. Take 1/2 to 1 tablet daily as directed by  coumadin clinic 02/05/19  Leonie Man, MD    Allergies  Allergen Reactions   Levofloxacin Other (See Comments)    Developed tendon pain. (Has a history of a Achilles tendon tear)   Potassium Chloride Hives   Clarithromycin Nausea Only   Erythromycin Base Nausea And Vomiting   Metronidazole Rash   Penicillins Rash    Did it involve swelling of the face/tongue/throat, SOB, or low BP? No Did it involve sudden or severe rash/hives, skin peeling, or any reaction on the inside of your mouth or nose? No Did you need to seek medical attention at a hospital or doctor's office? No When did it last happen? as a child If all above answers are "NO", may proceed with cephalosporin use.      Review of Systems:              General:                      normal appetite, decreased energy, no weight gain, no weight loss, no fever             Cardiac:                       no chest pain with exertion, no chest pain at rest, +SOB with low level exertion, no resting SOB, no PND, no orthopnea, + palpitations, + arrhythmia, + atrial fibrillation, no LE edema, + dizzy spells, no syncope             Respiratory:                 + shortness of breath, no home oxygen, no productive cough, no dry cough, no bronchitis, no wheezing, no hemoptysis, no asthma, no pain with inspiration or cough, + sleep apnea, + CPAP at night             GI:                               no difficulty swallowing, + reflux, no frequent heartburn, + hiatal hernia, no abdominal pain, no constipation, no diarrhea, no hematochezia, no hematemesis, no melena             GU:                              no dysuria,  no frequency, no urinary tract infection, no hematuria, no kidney stones, no kidney disease             Vascular:                     no pain suggestive of claudication, no pain in feet, no leg cramps, no varicose veins, no DVT, no non-healing foot ulcer             Neuro:                         no stroke, no TIA's,  no seizures, no headaches, no temporary blindness one eye,  no slurred speech, + peripheral neuropathy, no chronic pain, no instability of gait, no memory/cognitive dysfunction             Musculoskeletal:         + arthritis, no joint swelling, no myalgias, no difficulty walking, no mobility  Skin:                            no rash, no itching, no skin infections, no pressure sores or ulcerations             Psych:                         no anxiety, no depression, no nervousness, no unusual recent stress             Eyes:                           no blurry vision, + floaters, no recent vision changes, + wears glasses or contacts             ENT:                            no hearing loss, no loose or painful teeth, no dentures, last saw dentist 9 months ago             Hematologic:               no easy bruising, no abnormal bleeding, no clotting disorder, no frequent epistaxis             Endocrine:                   no diabetes, does not check CBG's at home                           Physical Exam:              BP 134/70    Pulse 84    Resp 16    Ht 5' 5.5" (1.664 m)    Wt 167 lb (75.8 kg)    LMP  (LMP Unknown)    SpO2 97% Comment: RA   BMI 27.37 kg/m              General:                      Mildly obese,  well-appearing             HEENT:                       Unremarkable              Neck:                           no JVD, no bruits, no adenopathy              Chest:                          clear to auscultation, symmetrical breath sounds, no wheezes, no rhonchi              CV:                              Irregular rate and rhythm, grade II/VI holosystolic murmur best at apex              Abdomen:  soft, non-tender, no masses              Extremities:                 warm, well-perfused, pulses diminished but palpable, no LE edema             Rectal/GU                   Deferred             Neuro:                         Grossly non-focal and  symmetrical throughout             Skin:                            Clean and dry, no rashes, no breakdown   Diagnostic Tests:  Transthoracic Echocardiography  Patient: Doni, Bacha MR #: [id number] Study Date: 08/02/2018 Gender: F Age: 73 Height: 165.1 cm Weight: 76.2 kg BSA: 1.89 m^2 Pt. Status: Room:  SONOGRAPHER Wyatt Mage, [place] ATTENDING Glenetta Hew, MD ORDERING Glenetta Hew, MD Bucklin, MD PERFORMING [facility], Outpatient  cc:  ------------------------------------------------------------------- LV EF: 60% - 65%  ------------------------------------------------------------------- Indications: Dyspnea on exertion (R06.09).  ------------------------------------------------------------------- History: PMH: Dyspnea. Atrial fibrillation. Risk factors: OSA. Former tobacco use. Hypertension. Dyslipidemia.  ------------------------------------------------------------------- Study Conclusions  - Left ventricle: The cavity size was normal. Systolic function was normal. The estimated ejection fraction was in the range of 60% to 65%. Wall motion was normal; there were no regional wall motion abnormalities. - Aortic valve: Transvalvular velocity was within the normal range. There was no stenosis. There was no regurgitation. - Mitral valve: Transvalvular velocity was within the normal range. There was no evidence for stenosis. There was mild regurgitation. - Left atrium: The atrium was severely dilated. - Right ventricle: The cavity size was normal. Wall thickness was normal. Systolic function was normal. - Right atrium: The atrium was mildly dilated. - Atrial septum: No defect or patent foramen ovale was identified by color flow Doppler. - Tricuspid valve: There was severe regurgitation. - Pulmonary arteries: Systolic pressure was within the normal range. PA  peak pressure: 30 mm Hg (S).  ------------------------------------------------------------------- Study data: No prior study was available for comparison. Study status: Routine. Procedure: The patient reported no pain pre or post test. Transthoracic echocardiography. Image quality was adequate. Study completion: There were no complications. Transthoracic echocardiography. M-mode, complete 2D, spectral Doppler, and color Doppler. Birthdate: Patient birthdate: 20-Feb-1935. Age: Patient is 84 yr old. Sex: Gender: female. BMI: 28 kg/m^2. Blood pressure: 138/79 Patient status: Outpatient. Study date: Study date: 08/02/2018. Study time: 10:03 AM. Location: [facility]  -------------------------------------------------------------------  ------------------------------------------------------------------- Left ventricle: The cavity size was normal. Systolic function was normal. The estimated ejection fraction was in the range of 60% to 65%. Wall motion was normal; there were no regional wall motion abnormalities.  ------------------------------------------------------------------- Aortic valve: Trileaflet; mildly thickened, mildly calcified leaflets. Mobility was not restricted. Doppler: Transvalvular velocity was within the normal range. There was no stenosis. There was no regurgitation.  ------------------------------------------------------------------- Aorta: Aortic root: The aortic root was normal in size.  ------------------------------------------------------------------- Mitral valve: Structurally normal valve. Mobility was not restricted. Doppler: Transvalvular velocity was within the normal range. There was no evidence for stenosis. There was mild regurgitation.  ------------------------------------------------------------------- Left atrium: The atrium was severely  dilated.  ------------------------------------------------------------------- Atrial septum: No defect or patent foramen ovale was identified by color flow Doppler.  ------------------------------------------------------------------- Right ventricle: The cavity size was normal. Wall thickness was normal. Systolic function was normal.  ------------------------------------------------------------------- Pulmonic valve: Structurally normal valve. Cusp separation was normal. Doppler: Transvalvular velocity was within the normal range. There was no evidence for stenosis. There was no regurgitation.  ------------------------------------------------------------------- Tricuspid valve: Structurally normal valve. Doppler: Transvalvular velocity was within the normal range. There was severe regurgitation.  ------------------------------------------------------------------- Pulmonary artery: The main pulmonary artery was normal-sized. Systolic pressure was within the normal range.  ------------------------------------------------------------------- Right atrium: The atrium was mildly dilated.  ------------------------------------------------------------------- Pericardium: There was no pericardial effusion.  ------------------------------------------------------------------- Systemic veins: Inferior vena cava: The vessel was normal in size. The respirophasic diameter changes were in the normal range (>= 50%), consistent with normal central venous pressure.  ------------------------------------------------------------------- Measurements  Left ventricle Value Reference LV ID, ED, PLAX chordal 44 mm 43 - 52 LV ID, ES, PLAX chordal 28 mm 23 - 38 LV fx shortening, PLAX chordal 36 % >=29 LV PW thickness, ED 11 mm --------- IVS/LV PW ratio,  ED 0.91 <=1.3 Stroke volume, 2D 93 ml --------- Stroke volume/bsa, 2D 49 ml/m^2 ---------  Ventricular septum Value Reference IVS thickness, ED 10 mm ---------  LVOT Value Reference LVOT ID, S 22 mm --------- LVOT area 3.8 cm^2 --------- LVOT peak velocity, S 111 cm/s --------- LVOT mean velocity, S 77.6 cm/s --------- LVOT VTI, S 24.5 cm ---------  Aorta Value Reference Aortic root ID, ED 38 mm ---------  Left atrium Value Reference LA ID, A-P, ES 45 mm --------- LA ID/bsa, A-P (H) 2.38 cm/m^2 <=2.2 LA volume, S 82.7 ml --------- LA volume/bsa, S 43.8 ml/m^2 --------- LA volume, ES, 1-p A4C 75.8 ml --------- LA volume/bsa, ES, 1-p A4C 40.1 ml/m^2 --------- LA volume, ES, 1-p A2C 87.2 ml --------- LA volume/bsa, ES, 1-p A2C 46.2 ml/m^2 ---------  Pulmonary arteries Value Reference PA pressure, S, DP 30 mm Hg <=30  Tricuspid valve Value Reference Tricuspid regurg peak velocity 258 cm/s --------- Tricuspid peak RV-RA gradient 27 mm Hg ---------  Systemic veins Value Reference Estimated CVP 3 mm Hg ---------  Right ventricle Value Reference TAPSE  19.5 mm --------- RV pressure, S, DP 30 mm Hg <=30 RV s&', lateral, S 10.7 cm/s ---------  Legend: (L) and (H) mark values outside specified reference range.  ------------------------------------------------------------------- Prepared and Electronically Authenticated by  Skeet Latch, MD 2019-08-08T14:51:21    NUCLEAR STRESS TEST   Nuclear stress EF: 71%.  The left ventricular ejection fraction is hyperdynamic (>65%).  There was no ST segment deviation noted during stress.  The study is normal.  This is a low risk study.  Normal stress nuclear study with no ischemia or infarction; EF 71 with normal wall motion.       Transesophageal Echocardiography  Patient: Lua, Feng MR #: [id number] Study Date: 01/16/2019 Gender: F Age: 41 Height: 166.4 cm Weight: 77.1 kg BSA: 1.91 m^2 Pt. Status: Room:  PERFORMING Fransico Him, MD Criss Rosales ADMITTING Glenetta Hew, MD ATTENDING Glenetta Hew, MD SONOGRAPHER Johny Chess, RDCS, CCT  cc:  ------------------------------------------------------------------- LV EF: 55% - 60%  ------------------------------------------------------------------- Indications: Tricuspid insufficiency 424.2. Severe valvular disease  ------------------------------------------------------------------- Study Conclusions  - Left ventricle: Systolic function was normal. The estimated ejection fraction was in the range of 55% to 60%. Wall motion was normal; there were no regional wall motion abnormalities. - Aortic valve: Bicuspid; mildly thickened leaflets. There was trivial regurgitation. -  Mitral valve: Mobility of the posterior leaflet was mildly restricted. There was mild to moderate regurgitation directed centrally. - Left  atrium: The atrium was moderately to severely dilated. No evidence of thrombus in the atrial cavity or appendage. There was spontaneous echo contrast (&quot;smoke&quot;). - Right atrium: The atrium was severely dilated. No evidence of thrombus in the atrial cavity or appendage. - Atrial septum: There was increased thickness of the septum, consistent with lipomatous hypertrophy. - Tricuspid valve: Poor coaptation of the TV leaflets due to annular dilatation. There is severe TR.  ------------------------------------------------------------------- Study data: Study status: Routine. Consent: The risks, benefits, and alternatives to the procedure were explained to the patient and informed consent was obtained. Procedure: Initial setup. The patient was brought to the laboratory. Surface ECG leads were monitored. Sedation. Conscious sedation was administered by cardiology staff. Transesophageal echocardiography. A 3rd probe transesophageal probe was inserted by the attending cardiologistwithout difficulty. Image quality was adequate. Study completion: The patient tolerated the procedure well. There were no complications. Administered medications: Midazolam, '3mg'$ , IV. Fentanyl, 37.60mg, IV. Diagnostic transesophageal echocardiography. 2D and color Doppler. Birthdate: Patient birthdate: [birth year] Age: Patient is 84yr old. Sex: Gender: female. BMI: 27.8 kg/m^2. Blood pressure: 143/94 Patient status: Outpatient. Study date: Study date: 01/16/2019. Study time: 07:50 AM. Location: Endoscopy.  -------------------------------------------------------------------  ------------------------------------------------------------------- Left ventricle: Systolic function was normal. The estimated ejection fraction was in the range of 55% to 60%. Wall motion was normal; there were no regional wall motion  abnormalities.  ------------------------------------------------------------------- Aortic valve: Bicuspid; mildly thickened leaflets. Cusp separation was normal. Doppler: There was trivial regurgitation.  ------------------------------------------------------------------- Aorta: There was no atheroma. There was no evidence for dissection. Aortic root: The aortic root was not dilated. Ascending aorta: The ascending aorta was normal in size. Aortic arch: The aortic arch was normal in size. Descending aorta: The descending aorta was normal in size.  ------------------------------------------------------------------- Mitral valve: Mildly thickened leaflets . Leaflet separation was normal. Mobility of the posterior leaflet was mildly restricted. Doppler: There was mild to moderate regurgitation directed centrally.  ------------------------------------------------------------------- Left atrium: The atrium was moderately to severely dilated. No evidence of thrombus in the atrial cavity or appendage. There was spontaneous echo contrast (&quot;smoke&quot;). The appendage was morphologically a left appendage, multilobulated, and of normal size.  ------------------------------------------------------------------- Atrial septum: There was increased thickness of the septum, consistent with lipomatous hypertrophy.  ------------------------------------------------------------------- Right ventricle: The cavity size was normal. Wall thickness was normal. Systolic function was normal.  ------------------------------------------------------------------- Pulmonic valve: Structurally normal valve.  ------------------------------------------------------------------- Tricuspid valve: Poor coaptation of the TV leaflets due to annular dilatation. There is severe TR.  ------------------------------------------------------------------- Pulmonary artery: The main pulmonary  artery was normal-sized.  ------------------------------------------------------------------- Right atrium: The atrium was severely dilated. No evidence of thrombus in the atrial cavity or appendage. The appendage was morphologically a right appendage.  ------------------------------------------------------------------- Pericardium: There was no pericardial effusion.  ------------------------------------------------------------------- Measurements  Mitral valve Value Mitral maximal regurg velocity, PISA 538 cm/s Mitral regurg VTI, PISA 178 cm Mitral ERO, PISA 0.04 cm^2 Mitral regurg volume, PISA 7 ml  Legend: (L) and (H) mark values outside specified reference range.  ------------------------------------------------------------------- Prepared and Electronically Authenticated by  TFransico Him MD 2020-01-23T14:27:30    RIGHT/LEFT HEART CATH AND CORONARY ANGIOGRAPHY  Conclusion     Angiographically normal coronary arteries  LV end diastolic pressure is normal.  No evidence pulmonary pretension  SUMMARY  Angiographically minimal CAD.  Strikingly normal RIGHT HEART CATH PRESSURES with only high normal mean PA pressure of 23 mmHg in the  setting of PCWP pressure of 18 mmHg.  Basically euvolemic with LVEDP of 7 mmHg. -With mean PA pressure of 23 mmHg there is a transpulmonary gradient suggesting a primary pulmonary etiology.   RECOMMENDATIONS  We will defer to Dr. Roxy Manns reference potential management of tricuspid valve insufficiency based on TEE with relative normal right heart cath numbers.  Would recommend treating potential pulmonary etiology.  Otherwise would continue current cardiac medications    Follow-up as scheduled.  Glenetta Hew, MD    Recommendations   Antiplatelet/Anticoag No indication for antiplatelet therapy  at this time . Resume Warfarin tonight.  Surgeon Notes     01/16/2019 8:41 AM CV Procedure signed by Sueanne Margarita, MD  Indications   Nonrheumatic tricuspid valve regurgitation [I36.1 (ICD-10-CM)]  DOE (dyspnea on exertion) [R06.09 (ICD-10-CM)]  Procedural Details   Technical Details PCP: Marin Olp, MD  Cardiologist: Dr. [name], Beckie Busing, NP;  EP: [name], NP in the Afib clinic.  CT surgeon: Dr. Roxy Manns  Mrs. Plake is a very pleasant 84 year old woman with chronic persistent atrial fibrillation (unsuccessful rhythm control/cardioversion) who has been having significant exertional dyspnea. Echocardiographically she was found to have severe tricuspid regurgitation, but no other signs and symptoms of cor pulmonale. She was referred to Dr. Roxy Manns to consider Maze procedure plus tricuspid repair. Dr. Roxy Manns was not convinced that this would be appropriate therapy without first evaluating for potential ischemic etiology and/or pulmonary pretension. She was therefore Scheduled for Right and Left Heart Cath and TEE today after being seen by Jory Sims, NP.  She Has Had Her TEE, and Is Now Presenting for Her Right and Left Heart Cath. She has not had any chest pain or pressure. She remains in A. fib. Warfarin has been on hold for the procedure.  Time Out: Verified patient identification, verified procedure, site/side was marked, verified correct patient position, special equipment/implants available, medications/allergies/relevent history reviewed, required imaging and test results available. Performed.   Access:  Right radial Artery: 6 Fr sheath -- Seldinger technique using Angiocath Micropuncture Kit * Direct ultrasound guidance used. Permanent image obtained and placed on chart.  *10 mL radial cocktail IA; 4000 Units IV Heparin  * Right Brachial/Antecubital Vein: The existing 18-gauge IV was exchanged over a wire for a 5Fr sheath  Right Heart Catheterization: 5 Fr  Gordy Councilman catheter advanced under fluoroscopy with balloon inflated to the RA, RV, then PCWP-PA for hemodynamic measurement.  * Simultaneous FA & PA blood gases checked for SaO2% to calculate FICK CO/CI  * The Catheter removed completely out of the body with balloon deflated.  Left Heart Catheterization: 5Fr Catheters were advanced or exchanged over a J-wire under direct fluoroscopic guidance into the ascending aorta; TIG 4.0 catheter advanced first.  * LV Hemodynamics (LV Gram): Angled Pigtail Catheter * Left & Right Coronary Artery Cineangiography: TIG 4.0 Catheter   Upon completion of Angiogaphy, the catheter was removed completely out of the body over a wire, without complication.  Brachial Sheath(s) removed in the post procedure unit with manual pressure for hemostasis.   Radial sheath removed in the Cardiac Catheterization lab with TR Band placed for hemostasis.  TR Band: 1110 Hours; 11 mL air  MEDICATIONS * SQ Lidocaine 16m * Radial Cocktail: 3 mg Verapmil in 10 mL NS * Isovue Contrast: 30 * Heparin: 4000 Units  Fluoro time: 5.8 minutes. Dose Area Product: 5.6 mGycm2. Cumulative Air Kerma: 104.6 mGy. Physician was notified of the radiation values and acknowledged them during the procedure.  Estimated blood loss <50 mL.   During this procedure medications were administered to achieve and maintain moderate conscious sedation while the patient's heart rate, blood pressure, and oxygen saturation were continuously monitored and I was present face-to-face 100% of this time.  Medications  (Filter: Administrations occurring from 01/16/19 1013 to 01/16/19 1123)          Medication Rate/Dose/Volume Action  Date Time   midazolam (VERSED) injection (mg) 1 mg Given 01/16/19 1033   Total dose as of 01/28/19 1048        1 mg        Heparin (Porcine) in NaCl 1000-0.9 UT/500ML-% SOLN (mL) 500 mL Given 01/16/19 1033   Total dose as of 01/28/19 1048 500 mL Given 1034   1,000  mL        lidocaine (PF) (XYLOCAINE) 1 % injection (mL) 2 mL Given 01/16/19 1037   Total dose as of 01/28/19 1048 2 mL Given 1038   4 mL        Radial Cocktail/Verapamil only (mL) 10 mL Given 01/16/19 1039   Total dose as of 01/28/19 1048        10 mL        heparin injection (Units) 3,000 Units Given 01/16/19 1057   Total dose as of 01/28/19 1048        3,000 Units        iohexol (OMNIPAQUE) 350 MG/ML injection (mL) 30 mL Given 01/16/19 1114   Total dose as of 01/28/19 1048        30 mL        Sedation Time   Sedation Time Physician-1: 32 minutes 31 seconds  Complications   Complications documented before study signed (01/16/2019 2:03 PM EST)    No complications were associated with this study.  Documented by Leonie Man, MD - 01/16/2019 11:23 AM EST    Coronary Findings   Diagnostic  Dominance: Right  Left Main  Vessel was injected. Vessel is large. Vessel is angiographically normal.  Left Anterior Descending  Vessel was injected. Vessel is normal in caliber and large. Vessel is angiographically normal.  First Diagonal Branch  Vessel is moderate in size.  First Septal Branch  Vessel is small in size.  Second Diagonal Branch  Vessel is small in size.  Second Septal Branch  Vessel is small in size.  Third Septal Branch  Vessel is small in size.  Ramus Intermedius  Vessel was injected. Vessel is moderate in size. Vessel is angiographically normal.  Left Circumflex  Vessel was injected. Vessel is normal in caliber and large. Vessel is angiographically normal.  First Obtuse Marginal Branch  Vessel is large in size.  Left Atrioventricular Groove Continuation  Vessel is small in size.  Right Coronary Artery  Vessel was injected. Vessel is normal in caliber and large. Vessel is angiographically normal.  Right Posterior Descending Artery  Vessel is moderate in size.  Right Posterior Atrioventricular Branch   Vessel is moderate in size.  First Right Posterolateral  Vessel is small in size.  Second Right Posterolateral  Vessel is small in size.  Intervention   No interventions have been documented.  Right Heart   Right Heart Pressures PA P 34/14 mmHg. Mean PA P 23 mmHg PCWP 18 mmHg LV EDP is normal. LVP-EDP: 112/1 mmHg-6 mmHg Aortic pressure 119/65 mmHg. MAP 86 mmHg  SaO2 95%. PaO2 69%. Cardiac Output-Index by FICK: 5.02-2.71  Right Atrium The right atrium is severely dilated. RA pressure 7  mmHg. Peak pressure of 13 mmHg. (Upper normal)  Right Ventricle RVP-EDP: 31/1 mmHg - 5 mmHg  Wall Motion   Resting    No LV Gram        Left Heart   Left Ventricle LV end diastolic pressure is normal.  Coronary Diagrams   Diagnostic  Dominance: Right    Intervention   Implants    No implant documentation for this case.  Syngo Images      Show images for CARDIAC CATHETERIZATION  MERGE Images   Show images for CARDIAC CATHETERIZATION   Link to Procedure Log   Procedure Log    Hemo Data    Most Recent Value  Fick Cardiac Output 5.02 L/min  Fick Cardiac Output Index 2.71 (L/min)/BSA  RA A Wave 6 mmHg  RA V Wave 13 mmHg  RA Mean 7 mmHg  RV Systolic Pressure 31 mmHg  RV Diastolic Pressure -5 mmHg  RV EDP 5 mmHg  PA Systolic Pressure 34 mmHg  PA Diastolic Pressure 14 mmHg  PA Mean 23 mmHg  PW A Wave 15 mmHg  PW V Wave 25 mmHg  PW Mean 18 mmHg  AO Systolic Pressure 939 mmHg  AO Diastolic Pressure 70 mmHg  AO Mean 91 mmHg  LV Systolic Pressure 030 mmHg  LV Diastolic Pressure 1 mmHg  LV EDP 6 mmHg  AOp Systolic Pressure 092 mmHg  AOp Diastolic Pressure 65 mmHg  AOp Mean Pressure 86 mmHg  LVp Systolic Pressure 330 mmHg  LVp Diastolic Pressure 1 mmHg  LVp EDP Pressure 7 mmHg  QP/QS 1  TPVR Index 8.5 HRUI  TSVR Index 33.65 HRUI  PVR SVR Ratio 0.06  TPVR/TSVR Ratio 0.25      CT ANGIOGRAPHY CHEST, ABDOMEN AND PELVIS  TECHNIQUE: Multidetector  CT imaging through the chest, abdomen and pelvis was performed using the standard protocol during bolus administration of intravenous contrast. Multiplanar reconstructed images and MIPs were obtained and reviewed to evaluate the vascular anatomy.  CONTRAST: 71m ISOVUE-370 IOPAMIDOL (ISOVUE-370) INJECTION 76%  COMPARISON: None.  FINDINGS: CTA CHEST FINDINGS  Cardiovascular: Maximal diameter of the ascending aorta at the sinus of Valsalva, sino-tubular junction, and ascending aorta are 3.9 cm, 3.1 cm, and 3.9 cm. Allowing for cardiac motion artifact, there is no evidence of dissection. There is no evidence of acute intramural hematoma on precontrast images. Minimal aortic valvular calcifications are noted. Atherosclerotic calcifications of the aortic arch are present. No significant coronary artery calcifications.  Great vessels are patent. The right atrium is markedly enlarged. The left atrium is moderately enlarged. There is low-density in the non dependent portion of the left atrial appendage. This may represent unopacified blood. Thrombus is not excluded.  Mediastinum/Nodes: 11 mm short axis diameter precarinal lymph node. Other smaller scattered mediastinal nodes are noted. No pericardial effusion. The thyroid is within normal limits. Small hiatal hernia.  Lungs/Pleura: Subsegmental atelectasis towards lung bases. No pneumothorax or pleural effusion.  Musculoskeletal: No vertebral compression deformity.  Review of the MIP images confirms the above findings.  CTA ABDOMEN AND PELVIS FINDINGS  VASCULAR  Aorta: Nonaneurysmal and patent with scattered atherosclerotic calcification. There is no evidence of aortic dissection.  Celiac: Patent. Branch vessels patent.  SMA: Patent.  Renals: Single renal arteries are patent.  IMA: Diminutive and patent.  Inflow: Bilateral common, internal, and external iliac arteries are widely patent. There is no  evidence of dissection.  Veins: Retroaortic left renal vein anatomy.  Review of the MIP images confirms the above findings.  NON-VASCULAR  Hepatobiliary: Unremarkable  Pancreas: Unremarkable  Spleen: Unremarkable  Adrenals/Urinary Tract: 12 mm left adrenal nodule is nonspecific. Right adrenal gland and kidneys are within normal limits. Bladder is decompressed.  Stomach/Bowel: No obvious mass in the colon. Small bowel and stomach are decompressed.  Lymphatic: No abnormal retroperitoneal adenopathy.  Reproductive: Uterus and adnexa are within normal limits.  Other: No free fluid.  Musculoskeletal: No vertebral compression deformity. Degenerative disc disease throughout the lumbar spine with facet arthropathy.  Review of the MIP images confirms the above findings.  IMPRESSION: Vascular:  No luminal narrowing involving the aorta or iliac arterial system. Great vessels are patent.  **An incidental finding of potential clinical significance has been found. There is low-density within the non dependent portion of the left atrial appendage. Although this may simply represent mixing artifact with non-opacified blood, thrombus in the left atrial appendage can not be excluded. Please note that transesophageal echo was performed 01/16/2019 demonstrating no evidence of thrombus in the left atrial appendage.**  Maximal dilatation of the ascending aorta is 3.9 cm. This is nonaneurysmal and upper limits of normal in caliber. There is no evidence of dissection or acute intramural hematoma.  Marked dilatation of the right atrium. Moderate dilatation of the left atrium.  Nonvascular:  11 mm short axis diameter precarinal lymph node. This is mildly enlarged. Consider 3 month follow-up CT to ensure resolution as inflammatory disease versus neoplasm is not excluded.  12 mm left adrenal nodule is nonspecific on CT with contrast arterial phase imaging. If  there is a history of malignancy or risk of malignancy, consider six-month follow-up MRI.   Electronically Signed By: Marybelle Killings M.D. On: 02/27/2019 09:23    Impression:  Patient has severe tricuspid regurgitation and persistent atrial fibrillation that has failed an attempt at DC cardioversion.She describes a history consistent with a long gradual progression of symptoms of exertional shortness of breath and fatigue. For the last several months the patient complains that she gets short of breath with low-level activity,consistent with symptoms of chronic diastolic congestive heart failure, [place] Heart Association functional class III. At least some of the patient's symptoms of exertional shortness of breath may be related to physical deconditioning and decreased activity. She denies symptoms of lower extremity edema or abdominal bloating to suggest the presence of significant right-sided heart failure. She does not experience any exertional chest pain or chest tightness.  I have personally reviewed the patient's recent transesophagealechocardiogram,diagnostic cardiac catheterization, and CT angiogram. The patienthasnormal left ventricular size and systolic function. There is severe left atrial enlargement andmild to moderatemitral regurgitation. There is annular dilatation with normal leaflet mobility. The leaflets are mildly thickened with myxomatous degenerative change. The aortic valve is bicuspid. There is no significant aortic stenosis or aortic insufficiency. The leaflets are thin and move well. There is severe tricuspid regurgitation. Right ventricular size and function appear essentially normal.The right atrium is severely dilated and there is severe annular enlargement of the tricuspid annulus. There appears to be normal leaflet mobility of the tricuspid valve with mild myxomatous change and no prolapse. There does not appear to be any significant  left ventricular septal displacement or pulmonary hypertension.Diagnostic cardiac catheterization is notable for the absence of significant coronary artery disease. Right heart pressures and cardiac output were normal.CT angiography revealed no contraindicationsto peripheral cannulation for surgery.  Options include continued medical therapy with long-term anticoagulation and rate control for management of atrial fibrillation with careful attention to medical therapy for congestive heart failure. Catheter-based ablation  could be attempted, but I agree that the long-term likelihood of success would probably be relatively low because of the patient's advanced age, severe left atrial enlargement, and severe tricuspid regurgitation. Alternatively, the patient could undergo tricuspid valve repair and Maze procedure.Concomitant mitral ring annuloplastymaybe warranted at the time of surgery    Plan:  The patient was again counseled at length regarding the indications, risks and potential benefits of tricuspid valve repair and maze procedure.  The rationale for elective surgery has been explained, including a comparison between surgery and continued medical therapy with close follow-up.  The relative risks and benefits of performing a maze procedure was discussed at length, including the expected likelihood of long term freedom from recurrent symptomatic atrial fibrillation and/or atrial flutter.  The patient additionally provides consent for long term follow up following surgery including participation in the [facility].  The possibility that concomitant mitral annuloplasty might be indicated has been discussed.  Alternative surgical approaches have been discussed including a comparison between conventional sternotomy and minimally-invasive techniques.  The relative risks and benefits of each have been reviewed as they pertain to the patient's specific circumstances, and expectations for the  patient's postoperative convalescence has been discussed.  The patient desires to proceed with surgery in the near future.  We tentatively plan to proceed with surgery on June 12, 2019.  The patient has been instructed to stop taking warfarin 7 days prior to surgery.  The patient understands and accepts all potential risks of surgery including but not limited to risk of death, stroke or other neurologic complication, myocardial infarction, congestive heart failure, respiratory failure, renal failure, bleeding requiring transfusion and/or reexploration, arrhythmia, infection or other wound complications, pneumonia, pleural and/or pericardial effusion, pulmonary embolus, aortic dissection or other major vascular complication, or delayed complications related to valve repair or replacement including but not limited to structural valve deterioration and failure, thrombosis, embolization, endocarditis, or paravalvular leak.  Specific risks potentially related to the minimally-invasive approach were discussed at length, including but not limited to risk of conversion to full or partial sternotomy, aortic dissection or other major vascular complication, unilateral acute lung injury or pulmonary edema, phrenic nerve dysfunction or paralysis, rib fracture, chronic pain, lung hernia, or lymphocele. All of their questions have been answered.     Valentina Gu. Roxy Manns, MD 05/27/2019 3:25 PM

## 2019-06-11 NOTE — Anesthesia Preprocedure Evaluation (Addendum)
Anesthesia Evaluation  Patient identified by MRN, date of birth, ID band Patient awake    Reviewed: Allergy & Precautions, H&P , NPO status , Patient's Chart, lab work & pertinent test results  Airway Mallampati: II   Neck ROM: full    Dental  (+) Dental Advisory Given, Teeth Intact, Caps   Pulmonary sleep apnea , former smoker,    breath sounds clear to auscultation       Cardiovascular hypertension, + DOE  + Valvular Problems/Murmurs MR  Rhythm:regular Rate:Normal  Severe TR. Bicuspid aortic valve.   Neuro/Psych  Headaches,  Neuromuscular disease    GI/Hepatic hiatal hernia, GERD  ,  Endo/Other    Renal/GU      Musculoskeletal  (+) Arthritis ,   Abdominal   Peds  Hematology   Anesthesia Other Findings   Reproductive/Obstetrics                           Anesthesia Physical Anesthesia Plan  ASA: III  Anesthesia Plan: General   Post-op Pain Management:    Induction: Intravenous  PONV Risk Score and Plan: 3 and Ondansetron, Dexamethasone, Midazolam and Treatment may vary due to age or medical condition  Airway Management Planned: Double Lumen EBT  Additional Equipment: Arterial line, CVP, PA Cath, TEE, 3D TEE and Ultrasound Guidance Line Placement  Intra-op Plan:   Post-operative Plan: Post-operative intubation/ventilation  Informed Consent: I have reviewed the patients History and Physical, chart, labs and discussed the procedure including the risks, benefits and alternatives for the proposed anesthesia with the patient or authorized representative who has indicated his/her understanding and acceptance.       Plan Discussed with: CRNA, Anesthesiologist and Surgeon  Anesthesia Plan Comments: (PAT note written 06/11/2019 by Myra Gianotti, PA-C. )       Anesthesia Quick Evaluation

## 2019-06-11 NOTE — Progress Notes (Signed)
Anesthesia Chart Review:  Case: [id number] Date/Time: 06/12/19 0815   Procedures:      MINIMALLY INVASIVE TRICUSPID VALVE REPAIR (Right Chest)     MINIMALLY INVASIVE MAZE PROCEDURE (N/A )     possible MINIMALLY INVASIVE MITRAL VALVE REPAIR (MVR) (Right Chest)     TRANSESOPHAGEAL ECHOCARDIOGRAM (TEE) (N/A )   Anesthesia type: General   Pre-op diagnosis:      TR     MR     AFIB   Location: [facility] OR ROOM 15 / [place] OR   Surgeon: Rexene Alberts, MD      DISCUSSION: Patient is an 84 year old female scheduled for the above procedure.  History includes former smoker (quit 1969), HTN, afib (failed DCCV), severe TR, mild-moderate mitral regurgitation, chronic diastolic CHF, essential tremor, peripheral neuropathy, GERD, hiatal hernia, OSA (CPAP), ocular migraine.  Reports last warfarin dose 06/04/19.  06/08/19 presurgical COVID test negative.    VS: BP 138/75   Pulse 69   Temp (!) 36.3 C   Resp 18   Ht 5\' 5"  (1.651 m)   Wt 76.5 kg   LMP  (LMP Unknown)   SpO2 100%   BMI 28.06 kg/m   PROVIDERS: Marin Olp, MD is PCP Chesley Mires, MD is pulmonologist Glenetta Hew, MD is cardiologist   LABS: Labs reviewed: Acceptable for surgery. Total bili 1.6, but mildly elevated in the past. AST minimally elevated at 42. PLT count WNL. UA results called/routed to TCTS RN Ryan.  (all labs ordered are listed, but only abnormal results are displayed)  Labs Reviewed  BLOOD GAS, ARTERIAL - Abnormal; Notable for the following components:      Result Value   pO2, Arterial 130 (*)    All other components within normal limits  COMPREHENSIVE METABOLIC PANEL - Abnormal; Notable for the following components:   Sodium 132 (*)    CO2 20 (*)    Glucose, Bld 117 (*)    AST 42 (*)    Total Bilirubin 1.6 (*)    All other components within normal limits  PROTIME-INR - Abnormal; Notable for the following components:   Prothrombin Time 15.3 (*)    All other components within normal limits  URINALYSIS,  ROUTINE W REFLEX MICROSCOPIC - Abnormal; Notable for the following components:   Leukocytes,Ua MODERATE (*)    Bacteria, UA RARE (*)    All other components within normal limits  SURGICAL PCR SCREEN  APTT  CBC  HEMOGLOBIN A1C  TYPE AND SCREEN  ABO/RH    IMAGES: CXR 06/10/19: IMPRESSION: Stable chest.  No acute cardiopulmonary process.  CTA chest/abd/pelvis 02/27/19: IMPRESSION: Vascular: - No luminal narrowing involving the aorta or iliac arterial system. Great vessels are patent. - *An incidental finding of potential clinical significance has been found. There is low-density within the non dependent portion of the left atrial appendage. Although this may simply represent mixing artifact with non-opacified blood, thrombus in the left atrial appendage can not be excluded. Please note that transesophageal echo was performed 01/16/2019 demonstrating no evidence of thrombus in the left atrial appendage.* - Maximal dilatation of the ascending aorta is 3.9 cm. This is nonaneurysmal and upper limits of normal in caliber. There is no evidence of dissection or acute intramural hematoma. - Marked dilatation of the right atrium. Moderate dilatation of the left atrium.  Nonvascular: - 11 mm short axis diameter precarinal lymph node. This is mildly enlarged. Consider 3 month follow-up CT to ensure resolution as inflammatory disease versus neoplasm is  not excluded. - 12 mm left adrenal nodule is nonspecific on CT with contrast arterial phase imaging. If there is a history of malignancy or risk of malignancy, consider six-month follow-up MRI.   EKG: 06/10/19: Atrial fibrillation with controlled ventricular response Abnormal ECG Since last tracing T wave inversion in lead III is new Confirmed by Larae Grooms [phone]) on 06/10/2019 10:45:20 PM   CV: Carotid US 06/10/19: Summary: Right Carotid: Velocities in the right ICA are consistent with a 1-39% stenosis. Left Carotid:  Velocities in the left ICA are consistent with a 1-39% stenosis.  TEE 01/16/19: Study Conclusions - Left ventricle: Systolic function was normal. The estimated   ejection fraction was in the range of 55% to 60%. Wall motion was   normal; there were no regional wall motion abnormalities. - Aortic valve: Bicuspid; mildly thickened leaflets. There was   trivial regurgitation. - Mitral valve: Mobility of the posterior leaflet was mildly   restricted. There was mild to moderate regurgitation directed   centrally. - Left atrium: The atrium was moderately to severely dilated. No   evidence of thrombus in the atrial cavity or appendage. There was   spontaneous echo contrast (&quot;smoke&quot;). - Right atrium: The atrium was severely dilated. No evidence of   thrombus in the atrial cavity or appendage. - Atrial septum: There was increased thickness of the septum,   consistent with lipomatous hypertrophy. - Tricuspid valve: Poor coaptation of the TV leaflets due to   annular dilatation. There is severe TR.  Cardiac cath 01/16/19 [name], Shanon Brow, MD):  Angiographically normal coronary arteries  LV end diastolic pressure is normal.  No evidence pulmonary pretension SUMMARY  Angiographically minimal CAD.  Strikingly normal RIGHT HEART CATH PRESSURES with only high normal mean PA pressure of 23 mmHg in the setting of PCWP pressure of 18 mmHg.  Basically euvolemic with LVEDP of 7 mmHg. -With mean PA pressure of 23 mmHg there is a transpulmonary gradient suggesting a primary pulmonary etiology. RECOMMENDATIONS  We will defer to Dr. Roxy Manns reference potential management of tricuspid valve insufficiency based on TEE with relative normal right heart cath numbers.  Would recommend treating potential pulmonary etiology.  Otherwise would continue current cardiac medications  48 Hour Holter Monitor 08/02/18:  47 out of 48 hours analyzed.   Predominant rhythm 99% atrial fibrillation-flutter.    Minimum heart rate 33 bpm, maximum heart rate 124 bpm with an average of 69 bpm.   Very rare PVCs noted (52 total with 2 couplets) = <1%   No PACs noted.   Longest pause 2.63 sec    Past Medical History:  Diagnosis Date  . Achilles tendon rupture   . Arthritis of hand, right   . Carpal tunnel syndrome 09/04/2013  . Collagenous colitis 2005  . Colon polyps 2010   Tubular adenoma and hyperplastic  . Dental infection 10/25/2014  . Diverticulosis   . Esophageal stenosis   . Essential tremor   . GERD (gastroesophageal reflux disease)    hx hiatal hernia, hx esophagitis, hx stricture  . Heart murmur   . Hiatal hernia   . Hx: UTI (urinary tract infection)   . Hypertension   . Lower extremity neuropathy 08/23/2013   On B12 therapy with numbness in the feet bilaterally no evidence of diabetes   . Ocular migraine    jagged vision, a few per month  . OSA (obstructive sleep apnea) 12/21/2016   on CPAP  . Peripheral neuropathy    treated by Dr Posey Pronto (07/2015)  .  Persistent atrial fibrillation    Rate control with beta-blocker and anticoagulated with warfarin  . Severe tricuspid regurgitation 07/2018   Noted Aug 2019 during a fib workup. Severe LAE as well    Past Surgical History:  Procedure Laterality Date  . 48 hr Holter Monitor  07/2018   Persistent Afib (rate 33 - 124 bpm)  . APPENDECTOMY    . CARDIAC CATHETERIZATION    . CARDIOVERSION N/A 10/02/2018   Procedure: CARDIOVERSION;  Surgeon: Acie Fredrickson, Wonda Cheng, MD;  Location: [facility] ENDOSCOPY;  Service: Cardiovascular;  Laterality: N/A;  . CARPAL TUNNEL RELEASE    . CATARACT EXTRACTION    . EXCISION MORTON'S NEUROMA     Right foot  . EYE SURGERY     Cataracts with implants-bilateral  . INTRAOCULAR LENS IMPLANT, SECONDARY    . Moles removed    . MOUTH SURGERY     (For Exostosis)  . NUCLEAR  07/2018   EF 71 %. LOW RISK - no ischemia or Infarct.   Marland Kitchen RIGHT/LEFT HEART CATH AND CORONARY ANGIOGRAPHY N/A 01/16/2019   Procedure:  RIGHT/LEFT HEART CATH AND CORONARY ANGIOGRAPHY;  Surgeon: Leonie Man, MD;  Location: [place] CV LAB;; Angiographically normal coronary arteries.  Normal LVEDP. Upper Limit of Normal PAP~23 mmHg & PCWP 18 mmHg.  LVEDP of 7 mmHg. -With mean PAP of 23 mmHg there is a TPG suggesting a primary pulmonary etiology.  Marland Kitchen ROTATOR CUFF REPAIR    . TEE WITHOUT CARDIOVERSION N/A 01/16/2019   Procedure: TRANSESOPHAGEAL ECHOCARDIOGRAM (TEE);  Surgeon: Sueanne Margarita, MD;  Location: [facility] ENDOSCOPY;;  Severe TR due to poor coaptation of the leaflets from annular dilation.  Mild to moderate mitral vegetation with mildly restricted mobility of the posterior leaflet.  EF 55-60% with no R WMA.  Severe RA and LA dilation.  . TONSILLECTOMY    . TRANSTHORACIC ECHOCARDIOGRAM  07/2018   Normal LV Fxn (EF 60-65%) no RWMA.  Severe LA dilation & Severe TR.    MEDICATIONS: . Multiple Vitamins-Minerals (PRESERVISION AREDS 2 PO)  . B Complex Vitamins (B COMPLEX 100 PO)  . calcium carbonate (TUMS - DOSED IN MG ELEMENTAL CALCIUM) 500 MG chewable tablet  . chlorthalidone (HYGROTON) 25 MG tablet  . cholecalciferol (VITAMIN D3) 25 MCG (1000 UT) tablet  . famotidine (PEPCID) 20 MG tablet  . hydroxypropyl methylcellulose / hypromellose (ISOPTO TEARS / GONIOVISC) 2.5 % ophthalmic solution  . Multiple Vitamins-Minerals (PRESERVISION AREDS PO)  . nadolol (CORGARD) 40 MG tablet  . warfarin (COUMADIN) 5 MG tablet   No current facility-administered medications for this encounter.    Derrill Memo ON 06/12/2019] cefUROXime (ZINACEF) 1.5 g in sodium chloride 0.9 % 100 mL IVPB  . cefUROXime (ZINACEF) 750 mg in sodium chloride 0.9 % 100 mL IVPB  . [START ON 06/12/2019] dexmedetomidine (PRECEDEX) 400 MCG/100ML (4 mcg/mL) infusion  . [START ON 06/12/2019] DOPamine (INTROPIN) 800 mg in dextrose 5 % 250 mL (3.2 mg/mL) infusion  . [START ON 06/12/2019] EPINEPHrine (ADRENALIN) 4 mg in dextrose 5 % 250 mL (0.016 mg/mL) infusion  . [START ON  06/12/2019] glutaraldehyde 0.625% cardiac soaking solution  . [START ON 06/12/2019] heparin 2,500 Units, papaverine 30 mg in electrolyte-148 (PLASMALYTE-148) 500 mL irrigation  . [START ON 06/12/2019] heparin 30,000 units/NS 1000 mL solution for CELLSAVER  . [START ON 06/12/2019] insulin regular, human (MYXREDLIN) 100 units/ 100 mL infusion  . [place] Blood Cardioplegia (KBC) lidocaine 2% Syringe (68mL)  . [place] Blood Cardioplegia (KBC) lidocaine 2% Syringe (68mL)  .  [place] Blood Cardioplegia (KBC) mannitol 20% Syringe (22mL)  . [place] Blood Cardioplegia (KBC) mannitol 20% Syringe (39mL)  . [START ON 06/12/2019] magnesium sulfate (IV Push/IM) injection 40 mEq  . [START ON 06/12/2019] milrinone (PRIMACOR) 20 MG/100 ML (0.2 mg/mL) infusion  . [START ON 06/12/2019] nitroGLYCERIN 50 mg in dextrose 5 % 250 mL (0.2 mg/mL) infusion  . [START ON 06/12/2019] norepinephrine (LEVOPHED) 4mg  in 212mL premix infusion  . [START ON 06/12/2019] phenylephrine (NEOSYNEPHRINE) 20-0.9 MG/250ML-% infusion  . [START ON 06/12/2019] potassium chloride injection 80 mEq  . [START ON 06/12/2019] tranexamic acid (CYKLOKAPRON) 2,500 mg in sodium chloride 0.9 % 250 mL (10 mg/mL) infusion  . [START ON 06/12/2019] tranexamic acid (CYKLOKAPRON) bolus via infusion - over 30 minutes 1,147.5 mg  . [START ON 06/12/2019] tranexamic acid (CYKLOKAPRON) pump prime solution 153 mg  . [START ON 06/12/2019] vancomycin (VANCOCIN) 1,000 mg in sodium chloride 0.9 % 1,000 mL irrigation  . [START ON 06/12/2019] vancomycin (VANCOCIN) 1,250 mg in sodium chloride 0.9 % 250 mL IVPB    Myra Gianotti, PA-C Surgical Short Stay/Anesthesiology [place] Phone [phone] [facility] Phone [phone] 06/11/2019 10:15 AM

## 2019-06-12 ENCOUNTER — Encounter (HOSPITAL_COMMUNITY): Payer: Self-pay | Admitting: Certified Registered"

## 2019-06-12 ENCOUNTER — Inpatient Hospital Stay (HOSPITAL_COMMUNITY): Payer: Medicare Other | Admitting: Certified Registered"

## 2019-06-12 ENCOUNTER — Inpatient Hospital Stay (HOSPITAL_COMMUNITY): Payer: Medicare Other

## 2019-06-12 ENCOUNTER — Encounter (HOSPITAL_COMMUNITY)
Admission: RE | Disposition: A | Discharge status: Home / Self Care | Payer: Self-pay | Source: Home / Self Care | Attending: Thoracic Surgery (Cardiothoracic Vascular Surgery)

## 2019-06-12 ENCOUNTER — Inpatient Hospital Stay (HOSPITAL_COMMUNITY)
Admission: RE | Admit: 2019-06-12 | Discharge: 2019-06-19 | DRG: 219 | Disposition: A | Discharge status: Home / Self Care | Payer: Medicare Other | Attending: Thoracic Surgery (Cardiothoracic Vascular Surgery) | Admitting: Thoracic Surgery (Cardiothoracic Vascular Surgery)

## 2019-06-12 ENCOUNTER — Other Ambulatory Visit: Payer: Self-pay

## 2019-06-12 ENCOUNTER — Inpatient Hospital Stay (HOSPITAL_COMMUNITY): Payer: Medicare Other | Admitting: Vascular Surgery

## 2019-06-12 DIAGNOSIS — I509 Heart failure, unspecified: Secondary | ICD-10-CM | POA: Diagnosis not present

## 2019-06-12 DIAGNOSIS — I442 Atrioventricular block, complete: Secondary | ICD-10-CM | POA: Diagnosis not present

## 2019-06-12 DIAGNOSIS — Z4682 Encounter for fitting and adjustment of non-vascular catheter: Secondary | ICD-10-CM | POA: Diagnosis not present

## 2019-06-12 DIAGNOSIS — I081 Rheumatic disorders of both mitral and tricuspid valves: Secondary | ICD-10-CM | POA: Diagnosis present

## 2019-06-12 DIAGNOSIS — I4819 Other persistent atrial fibrillation: Secondary | ICD-10-CM | POA: Diagnosis present

## 2019-06-12 DIAGNOSIS — Z881 Allergy status to other antibiotic agents status: Secondary | ICD-10-CM | POA: Diagnosis not present

## 2019-06-12 DIAGNOSIS — E876 Hypokalemia: Secondary | ICD-10-CM | POA: Diagnosis not present

## 2019-06-12 DIAGNOSIS — Z9841 Cataract extraction status, right eye: Secondary | ICD-10-CM

## 2019-06-12 DIAGNOSIS — G4733 Obstructive sleep apnea (adult) (pediatric): Secondary | ICD-10-CM | POA: Diagnosis present

## 2019-06-12 DIAGNOSIS — Z8679 Personal history of other diseases of the circulatory system: Secondary | ICD-10-CM

## 2019-06-12 DIAGNOSIS — I482 Chronic atrial fibrillation, unspecified: Secondary | ICD-10-CM | POA: Diagnosis present

## 2019-06-12 DIAGNOSIS — R0600 Dyspnea, unspecified: Secondary | ICD-10-CM | POA: Diagnosis present

## 2019-06-12 DIAGNOSIS — I361 Nonrheumatic tricuspid (valve) insufficiency: Secondary | ICD-10-CM | POA: Diagnosis not present

## 2019-06-12 DIAGNOSIS — J9811 Atelectasis: Secondary | ICD-10-CM

## 2019-06-12 DIAGNOSIS — Z7901 Long term (current) use of anticoagulants: Secondary | ICD-10-CM | POA: Diagnosis not present

## 2019-06-12 DIAGNOSIS — I071 Rheumatic tricuspid insufficiency: Secondary | ICD-10-CM | POA: Diagnosis present

## 2019-06-12 DIAGNOSIS — I495 Sick sinus syndrome: Secondary | ICD-10-CM | POA: Diagnosis not present

## 2019-06-12 DIAGNOSIS — G629 Polyneuropathy, unspecified: Secondary | ICD-10-CM | POA: Diagnosis present

## 2019-06-12 DIAGNOSIS — Z961 Presence of intraocular lens: Secondary | ICD-10-CM | POA: Diagnosis present

## 2019-06-12 DIAGNOSIS — Z45018 Encounter for adjustment and management of other part of cardiac pacemaker: Secondary | ICD-10-CM | POA: Diagnosis not present

## 2019-06-12 DIAGNOSIS — I110 Hypertensive heart disease with heart failure: Secondary | ICD-10-CM | POA: Diagnosis present

## 2019-06-12 DIAGNOSIS — D6959 Other secondary thrombocytopenia: Secondary | ICD-10-CM | POA: Diagnosis not present

## 2019-06-12 DIAGNOSIS — G250 Essential tremor: Secondary | ICD-10-CM | POA: Diagnosis present

## 2019-06-12 DIAGNOSIS — I4811 Longstanding persistent atrial fibrillation: Secondary | ICD-10-CM | POA: Diagnosis not present

## 2019-06-12 DIAGNOSIS — Z87440 Personal history of urinary (tract) infections: Secondary | ICD-10-CM

## 2019-06-12 DIAGNOSIS — M19041 Primary osteoarthritis, right hand: Secondary | ICD-10-CM | POA: Diagnosis present

## 2019-06-12 DIAGNOSIS — E785 Hyperlipidemia, unspecified: Secondary | ICD-10-CM | POA: Diagnosis present

## 2019-06-12 DIAGNOSIS — T501X5A Adverse effect of loop [high-ceiling] diuretics, initial encounter: Secondary | ICD-10-CM | POA: Diagnosis not present

## 2019-06-12 DIAGNOSIS — Z800 Family history of malignant neoplasm of digestive organs: Secondary | ICD-10-CM

## 2019-06-12 DIAGNOSIS — I5033 Acute on chronic diastolic (congestive) heart failure: Secondary | ICD-10-CM | POA: Diagnosis present

## 2019-06-12 DIAGNOSIS — Z9889 Other specified postprocedural states: Secondary | ICD-10-CM | POA: Diagnosis not present

## 2019-06-12 DIAGNOSIS — Z419 Encounter for procedure for purposes other than remedying health state, unspecified: Secondary | ICD-10-CM

## 2019-06-12 DIAGNOSIS — Z79899 Other long term (current) drug therapy: Secondary | ICD-10-CM

## 2019-06-12 DIAGNOSIS — Y92230 Patient room in hospital as the place of occurrence of the external cause: Secondary | ICD-10-CM | POA: Diagnosis not present

## 2019-06-12 DIAGNOSIS — R0609 Other forms of dyspnea: Secondary | ICD-10-CM | POA: Diagnosis present

## 2019-06-12 DIAGNOSIS — D62 Acute posthemorrhagic anemia: Secondary | ICD-10-CM | POA: Diagnosis not present

## 2019-06-12 DIAGNOSIS — K219 Gastro-esophageal reflux disease without esophagitis: Secondary | ICD-10-CM | POA: Diagnosis present

## 2019-06-12 DIAGNOSIS — J90 Pleural effusion, not elsewhere classified: Secondary | ICD-10-CM | POA: Diagnosis not present

## 2019-06-12 DIAGNOSIS — I10 Essential (primary) hypertension: Secondary | ICD-10-CM | POA: Diagnosis present

## 2019-06-12 DIAGNOSIS — I340 Nonrheumatic mitral (valve) insufficiency: Secondary | ICD-10-CM

## 2019-06-12 DIAGNOSIS — Z87891 Personal history of nicotine dependence: Secondary | ICD-10-CM | POA: Diagnosis not present

## 2019-06-12 DIAGNOSIS — I051 Rheumatic mitral insufficiency: Secondary | ICD-10-CM | POA: Diagnosis present

## 2019-06-12 DIAGNOSIS — J939 Pneumothorax, unspecified: Secondary | ICD-10-CM | POA: Diagnosis not present

## 2019-06-12 DIAGNOSIS — Z4659 Encounter for fitting and adjustment of other gastrointestinal appliance and device: Secondary | ICD-10-CM

## 2019-06-12 DIAGNOSIS — Z8249 Family history of ischemic heart disease and other diseases of the circulatory system: Secondary | ICD-10-CM

## 2019-06-12 DIAGNOSIS — Z9842 Cataract extraction status, left eye: Secondary | ICD-10-CM | POA: Diagnosis not present

## 2019-06-12 DIAGNOSIS — Z880 Allergy status to penicillin: Secondary | ICD-10-CM

## 2019-06-12 DIAGNOSIS — R918 Other nonspecific abnormal finding of lung field: Secondary | ICD-10-CM | POA: Diagnosis not present

## 2019-06-12 DIAGNOSIS — Z888 Allergy status to other drugs, medicaments and biological substances status: Secondary | ICD-10-CM

## 2019-06-12 DIAGNOSIS — J029 Acute pharyngitis, unspecified: Secondary | ICD-10-CM | POA: Diagnosis not present

## 2019-06-12 HISTORY — PX: MINIMALLY INVASIVE MAZE PROCEDURE: SHX6244

## 2019-06-12 HISTORY — DX: Other specified postprocedural states: Z98.890

## 2019-06-12 HISTORY — PX: TEE WITHOUT CARDIOVERSION: SHX5443

## 2019-06-12 HISTORY — DX: Sick sinus syndrome: I49.5

## 2019-06-12 HISTORY — PX: MINIMALLY INVASIVE TRICUSPID VALVE REPAIR: SHX5975

## 2019-06-12 HISTORY — DX: Personal history of other diseases of the circulatory system: Z86.79

## 2019-06-12 LAB — COOXEMETRY PANEL
Carboxyhemoglobin: 1.1 % (ref 0.5–1.5)
Methemoglobin: 0.9 % (ref 0.0–1.5)
O2 Saturation: 65.9 %
Total hemoglobin: 9.8 g/dL — ABNORMAL LOW (ref 12.0–16.0)

## 2019-06-12 LAB — POCT I-STAT 7, (LYTES, BLD GAS, ICA,H+H)
Acid-Base Excess: 1 mmol/L (ref 0.0–2.0)
Acid-Base Excess: 1 mmol/L (ref 0.0–2.0)
Acid-base deficit: 3 mmol/L — ABNORMAL HIGH (ref 0.0–2.0)
Acid-base deficit: 3 mmol/L — ABNORMAL HIGH (ref 0.0–2.0)
Bicarbonate: 25.4 mmol/L (ref 20.0–28.0)
Bicarbonate: 24.4 mmol/L (ref 20.0–28.0)
Bicarbonate: 23.3 mmol/L (ref 20.0–28.0)
Bicarbonate: 22.9 mmol/L (ref 20.0–28.0)
Calcium, Ion: 0.94 mmol/L — ABNORMAL LOW (ref 1.15–1.40)
Calcium, Ion: 1.02 mmol/L — ABNORMAL LOW (ref 1.15–1.40)
Calcium, Ion: 1.06 mmol/L — ABNORMAL LOW (ref 1.15–1.40)
Calcium, Ion: 1.09 mmol/L — ABNORMAL LOW (ref 1.15–1.40)
HCT: 28 % — ABNORMAL LOW (ref 36.0–46.0)
HCT: 26 % — ABNORMAL LOW (ref 36.0–46.0)
HCT: 36 % (ref 36.0–46.0)
HCT: 37 % (ref 36.0–46.0)
Hemoglobin: 9.5 g/dL — ABNORMAL LOW (ref 12.0–15.0)
Hemoglobin: 8.8 g/dL — ABNORMAL LOW (ref 12.0–15.0)
Hemoglobin: 12.2 g/dL (ref 12.0–15.0)
Hemoglobin: 12.6 g/dL (ref 12.0–15.0)
O2 Saturation: 100 %
O2 Saturation: 100 %
O2 Saturation: 73 %
O2 Saturation: 90 %
Patient temperature: 35.6
Potassium: 3.7 mmol/L (ref 3.5–5.1)
Potassium: 3.5 mmol/L (ref 3.5–5.1)
Potassium: 3.3 mmol/L — ABNORMAL LOW (ref 3.5–5.1)
Potassium: 3.3 mmol/L — ABNORMAL LOW (ref 3.5–5.1)
Sodium: 132 mmol/L — ABNORMAL LOW (ref 135–145)
Sodium: 136 mmol/L (ref 135–145)
Sodium: 138 mmol/L (ref 135–145)
Sodium: 137 mmol/L (ref 135–145)
TCO2: 27 mmol/L (ref 22–32)
TCO2: 25 mmol/L (ref 22–32)
TCO2: 25 mmol/L (ref 22–32)
TCO2: 24 mmol/L (ref 22–32)
pCO2 arterial: 38.5 mmHg (ref 32.0–48.0)
pCO2 arterial: 34.8 mmHg (ref 32.0–48.0)
pCO2 arterial: 44.8 mmHg (ref 32.0–48.0)
pCO2 arterial: 38.8 mmHg (ref 32.0–48.0)
pH, Arterial: 7.428 (ref 7.350–7.450)
pH, Arterial: 7.455 — ABNORMAL HIGH (ref 7.350–7.450)
pH, Arterial: 7.324 — ABNORMAL LOW (ref 7.350–7.450)
pH, Arterial: 7.373 (ref 7.350–7.450)
pO2, Arterial: 472 mmHg — ABNORMAL HIGH (ref 83.0–108.0)
pO2, Arterial: 311 mmHg — ABNORMAL HIGH (ref 83.0–108.0)
pO2, Arterial: 42 mmHg — ABNORMAL LOW (ref 83.0–108.0)
pO2, Arterial: 56 mmHg — ABNORMAL LOW (ref 83.0–108.0)

## 2019-06-12 LAB — POCT I-STAT 4, (NA,K, GLUC, HGB,HCT)
Glucose, Bld: 110 mg/dL — ABNORMAL HIGH (ref 70–99)
Glucose, Bld: 113 mg/dL — ABNORMAL HIGH (ref 70–99)
Glucose, Bld: 122 mg/dL — ABNORMAL HIGH (ref 70–99)
Glucose, Bld: 135 mg/dL — ABNORMAL HIGH (ref 70–99)
Glucose, Bld: 141 mg/dL — ABNORMAL HIGH (ref 70–99)
Glucose, Bld: 127 mg/dL — ABNORMAL HIGH (ref 70–99)
Glucose, Bld: 104 mg/dL — ABNORMAL HIGH (ref 70–99)
Glucose, Bld: 126 mg/dL — ABNORMAL HIGH (ref 70–99)
Glucose, Bld: 126 mg/dL — ABNORMAL HIGH (ref 70–99)
HCT: 36 % (ref 36.0–46.0)
HCT: 33 % — ABNORMAL LOW (ref 36.0–46.0)
HCT: 36 % (ref 36.0–46.0)
HCT: 26 % — ABNORMAL LOW (ref 36.0–46.0)
HCT: 27 % — ABNORMAL LOW (ref 36.0–46.0)
HCT: 26 % — ABNORMAL LOW (ref 36.0–46.0)
HCT: 26 % — ABNORMAL LOW (ref 36.0–46.0)
HCT: 26 % — ABNORMAL LOW (ref 36.0–46.0)
HCT: 36 % (ref 36.0–46.0)
Hemoglobin: 12.2 g/dL (ref 12.0–15.0)
Hemoglobin: 11.2 g/dL — ABNORMAL LOW (ref 12.0–15.0)
Hemoglobin: 12.2 g/dL (ref 12.0–15.0)
Hemoglobin: 8.8 g/dL — ABNORMAL LOW (ref 12.0–15.0)
Hemoglobin: 9.2 g/dL — ABNORMAL LOW (ref 12.0–15.0)
Hemoglobin: 8.8 g/dL — ABNORMAL LOW (ref 12.0–15.0)
Hemoglobin: 8.8 g/dL — ABNORMAL LOW (ref 12.0–15.0)
Hemoglobin: 8.8 g/dL — ABNORMAL LOW (ref 12.0–15.0)
Hemoglobin: 12.2 g/dL (ref 12.0–15.0)
Potassium: 3.4 mmol/L — ABNORMAL LOW (ref 3.5–5.1)
Potassium: 3.4 mmol/L — ABNORMAL LOW (ref 3.5–5.1)
Potassium: 3.6 mmol/L (ref 3.5–5.1)
Potassium: 4 mmol/L (ref 3.5–5.1)
Potassium: 4.2 mmol/L (ref 3.5–5.1)
Potassium: 3.9 mmol/L (ref 3.5–5.1)
Potassium: 3.4 mmol/L — ABNORMAL LOW (ref 3.5–5.1)
Potassium: 3.8 mmol/L (ref 3.5–5.1)
Potassium: 3.3 mmol/L — ABNORMAL LOW (ref 3.5–5.1)
Sodium: 136 mmol/L (ref 135–145)
Sodium: 133 mmol/L — ABNORMAL LOW (ref 135–145)
Sodium: 136 mmol/L (ref 135–145)
Sodium: 134 mmol/L — ABNORMAL LOW (ref 135–145)
Sodium: 134 mmol/L — ABNORMAL LOW (ref 135–145)
Sodium: 134 mmol/L — ABNORMAL LOW (ref 135–145)
Sodium: 134 mmol/L — ABNORMAL LOW (ref 135–145)
Sodium: 136 mmol/L (ref 135–145)
Sodium: 138 mmol/L (ref 135–145)

## 2019-06-12 LAB — PROTIME-INR
INR: 1.8 — ABNORMAL HIGH (ref 0.8–1.2)
Prothrombin Time: 20.4 seconds — ABNORMAL HIGH (ref 11.4–15.2)

## 2019-06-12 LAB — CBC
HCT: 36 % (ref 36.0–46.0)
Hemoglobin: 12.4 g/dL (ref 12.0–15.0)
MCH: 32.5 pg (ref 26.0–34.0)
MCHC: 34.4 g/dL (ref 30.0–36.0)
MCV: 94.5 fL (ref 80.0–100.0)
Platelets: 109 10*3/uL — ABNORMAL LOW (ref 150–400)
RBC: 3.81 MIL/uL — ABNORMAL LOW (ref 3.87–5.11)
RDW: 12.3 % (ref 11.5–15.5)
WBC: 21.4 10*3/uL — ABNORMAL HIGH (ref 4.0–10.5)
nRBC: 0 % (ref 0.0–0.2)

## 2019-06-12 LAB — GLUCOSE, CAPILLARY
Glucose-Capillary: 112 mg/dL — ABNORMAL HIGH (ref 70–99)
Glucose-Capillary: 104 mg/dL — ABNORMAL HIGH (ref 70–99)
Glucose-Capillary: 105 mg/dL — ABNORMAL HIGH (ref 70–99)
Glucose-Capillary: 95 mg/dL (ref 70–99)
Glucose-Capillary: 157 mg/dL — ABNORMAL HIGH (ref 70–99)

## 2019-06-12 LAB — HEMOGLOBIN AND HEMATOCRIT, BLOOD
HCT: 26.3 % — ABNORMAL LOW (ref 36.0–46.0)
Hemoglobin: 9.2 g/dL — ABNORMAL LOW (ref 12.0–15.0)

## 2019-06-12 LAB — APTT: aPTT: 37 seconds — ABNORMAL HIGH (ref 24–36)

## 2019-06-12 LAB — PLATELET COUNT: Platelets: 100 10*3/uL — ABNORMAL LOW (ref 150–400)

## 2019-06-12 SURGERY — REPAIR, TRICUSPID VALVE, TRANSCATHETER
Anesthesia: General | Site: Chest | Laterality: Right

## 2019-06-12 MED ORDER — ALBUMIN HUMAN 5 % IV SOLN: 250.0000 mL | INTRAVENOUS | Status: AC | PRN

## 2019-06-12 MED ORDER — ACETAMINOPHEN 500 MG PO TABS
1000.0000 mg | ORAL_TABLET | Freq: Four times a day (QID) | ORAL | Status: AC
  Administered 2019-06-13 – 2019-06-17 (×17): 1000 mg via ORAL
  Filled 2019-06-12 (×18): qty 2

## 2019-06-12 MED ORDER — BISACODYL 10 MG RE SUPP: 10.0000 mg | Freq: Every day | RECTAL | Status: DC

## 2019-06-12 MED ORDER — TRANEXAMIC ACID 1000 MG/10ML IV SOLN
INTRAVENOUS | Status: DC | PRN
  Administered 2019-06-12: 1.5 mg/kg/h via INTRAVENOUS

## 2019-06-12 MED ORDER — ONDANSETRON HCL 4 MG/2ML IJ SOLN
INTRAMUSCULAR | Status: AC
  Filled 2019-06-12: qty 2

## 2019-06-12 MED ORDER — SODIUM CHLORIDE 0.45 % IV SOLN
INTRAVENOUS | Status: DC | PRN
  Administered 2019-06-12: 17:00:00 via INTRAVENOUS

## 2019-06-12 MED ORDER — VANCOMYCIN HCL IN DEXTROSE 1-5 GM/200ML-% IV SOLN
1000.0000 mg | Freq: Once | INTRAVENOUS | Status: AC
  Administered 2019-06-12: 1000 mg via INTRAVENOUS
  Filled 2019-06-12: qty 200

## 2019-06-12 MED ORDER — METOPROLOL TARTRATE 5 MG/5ML IV SOLN: 2.5000 mg | INTRAVENOUS | Status: DC | PRN

## 2019-06-12 MED ORDER — SODIUM CHLORIDE 0.9% FLUSH: 3.0000 mL | INTRAVENOUS | Status: DC | PRN

## 2019-06-12 MED ORDER — CHLORHEXIDINE GLUCONATE CLOTH 2 % EX PADS: 6.0000 | MEDICATED_PAD | Freq: Every day | CUTANEOUS | Status: DC

## 2019-06-12 MED ORDER — FENTANYL CITRATE (PF) 250 MCG/5ML IJ SOLN
INTRAMUSCULAR | Status: AC
  Filled 2019-06-12: qty 5

## 2019-06-12 MED ORDER — SODIUM CHLORIDE 0.9 % IV SOLN
INTRAVENOUS | Status: DC | PRN
  Administered 2019-06-12: 500 mL

## 2019-06-12 MED ORDER — SODIUM CHLORIDE 0.9 % IV SOLN
1.5000 g | Freq: Two times a day (BID) | INTRAVENOUS | Status: AC
  Administered 2019-06-12 – 2019-06-14 (×4): 1.5 g via INTRAVENOUS
  Filled 2019-06-12 (×4): qty 1.5

## 2019-06-12 MED ORDER — ASPIRIN 81 MG PO CHEW: 324.0000 mg | CHEWABLE_TABLET | Freq: Every day | ORAL | Status: DC

## 2019-06-12 MED ORDER — DOPAMINE-DEXTROSE 3.2-5 MG/ML-% IV SOLN
INTRAVENOUS | Status: DC | PRN
  Administered 2019-06-12: 3 ug/kg/min via INTRAVENOUS

## 2019-06-12 MED ORDER — SODIUM CHLORIDE 0.9 % IV SOLN
INTRAVENOUS | Status: DC | PRN
  Administered 2019-06-12: 15:00:00 via INTRAVENOUS

## 2019-06-12 MED ORDER — PROPOFOL 10 MG/ML IV BOLUS
INTRAVENOUS | Status: DC | PRN
  Administered 2019-06-12: 60 mg via INTRAVENOUS
  Administered 2019-06-12: 100 mg via INTRAVENOUS

## 2019-06-12 MED ORDER — PHENYLEPHRINE HCL-NACL 20-0.9 MG/250ML-% IV SOLN: 0.0000 ug/min | INTRAVENOUS | Status: DC

## 2019-06-12 MED ORDER — EPHEDRINE 5 MG/ML INJ
INTRAVENOUS | Status: AC
  Filled 2019-06-12: qty 10

## 2019-06-12 MED ORDER — ROCURONIUM BROMIDE 10 MG/ML (PF) SYRINGE
PREFILLED_SYRINGE | INTRAVENOUS | Status: AC
  Filled 2019-06-12: qty 10

## 2019-06-12 MED ORDER — DOCUSATE SODIUM 100 MG PO CAPS
200.0000 mg | ORAL_CAPSULE | Freq: Every day | ORAL | Status: DC
  Administered 2019-06-13 – 2019-06-16 (×3): 200 mg via ORAL
  Filled 2019-06-12 (×4): qty 2

## 2019-06-12 MED ORDER — MORPHINE SULFATE (PF) 2 MG/ML IV SOLN
1.0000 mg | INTRAVENOUS | Status: DC | PRN
  Administered 2019-06-13: 4 mg via INTRAVENOUS
  Administered 2019-06-13: 2 mg via INTRAVENOUS
  Administered 2019-06-13: 4 mg via INTRAVENOUS
  Filled 2019-06-12 (×3): qty 2

## 2019-06-12 MED ORDER — SODIUM CHLORIDE 0.9 % IV SOLN: 250.0000 mL | INTRAVENOUS | Status: DC

## 2019-06-12 MED ORDER — BUPIVACAINE HCL (PF) 0.5 % IJ SOLN
INTRAMUSCULAR | Status: AC
  Filled 2019-06-12: qty 10

## 2019-06-12 MED ORDER — ONDANSETRON HCL 4 MG/2ML IJ SOLN
INTRAMUSCULAR | Status: DC | PRN
  Administered 2019-06-12: 4 mg via INTRAVENOUS

## 2019-06-12 MED ORDER — SODIUM CHLORIDE 0.9% FLUSH
10.0000 mL | Freq: Two times a day (BID) | INTRAVENOUS | Status: DC
  Administered 2019-06-12: 20 mL
  Administered 2019-06-13 – 2019-06-14 (×3): 10 mL
  Administered 2019-06-15: 20 mL
  Administered 2019-06-15 – 2019-06-18 (×5): 10 mL

## 2019-06-12 MED ORDER — CHLORHEXIDINE GLUCONATE 4 % EX LIQD: 30.0000 mL | CUTANEOUS | Status: DC

## 2019-06-12 MED ORDER — PROTAMINE SULFATE 10 MG/ML IV SOLN
INTRAVENOUS | Status: AC
  Filled 2019-06-12: qty 25

## 2019-06-12 MED ORDER — ALBUMIN HUMAN 5 % IV SOLN
INTRAVENOUS | Status: DC | PRN
  Administered 2019-06-12 (×2): via INTRAVENOUS

## 2019-06-12 MED ORDER — SODIUM CHLORIDE 0.9 % IV SOLN: INTRAVENOUS | Status: DC

## 2019-06-12 MED ORDER — SODIUM CHLORIDE 0.9 % IV SOLN
INTRAVENOUS | Status: AC
  Filled 2019-06-12: qty 1.2

## 2019-06-12 MED ORDER — CHLORHEXIDINE GLUCONATE 0.12 % MT SOLN
15.0000 mL | OROMUCOSAL | Status: AC
  Administered 2019-06-12: 15 mL via OROMUCOSAL

## 2019-06-12 MED ORDER — LACTATED RINGERS IV SOLN
INTRAVENOUS | Status: DC | PRN
  Administered 2019-06-12 (×2): via INTRAVENOUS

## 2019-06-12 MED ORDER — SUCCINYLCHOLINE CHLORIDE 200 MG/10ML IV SOSY
PREFILLED_SYRINGE | INTRAVENOUS | Status: AC
  Filled 2019-06-12: qty 10

## 2019-06-12 MED ORDER — ACETAMINOPHEN 650 MG RE SUPP
650.0000 mg | Freq: Once | RECTAL | Status: AC
  Administered 2019-06-12: 650 mg via RECTAL

## 2019-06-12 MED ORDER — ARTIFICIAL TEARS OPHTHALMIC OINT
TOPICAL_OINTMENT | OPHTHALMIC | Status: DC | PRN
  Administered 2019-06-12: 1 via OPHTHALMIC

## 2019-06-12 MED ORDER — CHLORHEXIDINE GLUCONATE CLOTH 2 % EX PADS
6.0000 | MEDICATED_PAD | Freq: Every day | CUTANEOUS | Status: DC
  Administered 2019-06-12 – 2019-06-18 (×7): 6 via TOPICAL

## 2019-06-12 MED ORDER — SODIUM CHLORIDE 0.9% FLUSH
3.0000 mL | Freq: Two times a day (BID) | INTRAVENOUS | Status: DC
  Administered 2019-06-13: 3 mL via INTRAVENOUS

## 2019-06-12 MED ORDER — PANTOPRAZOLE SODIUM 40 MG PO TBEC
40.0000 mg | DELAYED_RELEASE_TABLET | Freq: Every day | ORAL | Status: DC
  Administered 2019-06-14 – 2019-06-19 (×6): 40 mg via ORAL
  Filled 2019-06-12 (×7): qty 1

## 2019-06-12 MED ORDER — PROPOFOL 10 MG/ML IV BOLUS
INTRAVENOUS | Status: AC
  Filled 2019-06-12: qty 20

## 2019-06-12 MED ORDER — LACTATED RINGERS IV SOLN
INTRAVENOUS | Status: DC | PRN
  Administered 2019-06-12: 07:00:00 via INTRAVENOUS

## 2019-06-12 MED ORDER — INSULIN REGULAR BOLUS VIA INFUSION
0.0000 [IU] | Freq: Three times a day (TID) | INTRAVENOUS | Status: DC
  Filled 2019-06-12: qty 10

## 2019-06-12 MED ORDER — LACTATED RINGERS IV SOLN
INTRAVENOUS | Status: DC
  Administered 2019-06-13: 16:00:00 via INTRAVENOUS

## 2019-06-12 MED ORDER — SODIUM CHLORIDE 0.9% FLUSH: 10.0000 mL | INTRAVENOUS | Status: DC | PRN

## 2019-06-12 MED ORDER — ASPIRIN EC 325 MG PO TBEC
325.0000 mg | DELAYED_RELEASE_TABLET | Freq: Every day | ORAL | Status: DC
  Administered 2019-06-13 – 2019-06-14 (×2): 325 mg via ORAL
  Filled 2019-06-12 (×2): qty 1

## 2019-06-12 MED ORDER — MIDAZOLAM HCL (PF) 10 MG/2ML IJ SOLN
INTRAMUSCULAR | Status: AC
  Filled 2019-06-12: qty 2

## 2019-06-12 MED ORDER — INSULIN ASPART 100 UNIT/ML ~~LOC~~ SOLN
0.0000 [IU] | SUBCUTANEOUS | Status: DC
  Administered 2019-06-12: 2 [IU] via SUBCUTANEOUS

## 2019-06-12 MED ORDER — ACETAMINOPHEN 160 MG/5ML PO SOLN: 650.0000 mg | Freq: Once | ORAL | Status: AC

## 2019-06-12 MED ORDER — BUPIVACAINE HCL (PF) 0.25 % IJ SOLN
INTRAMUSCULAR | Status: DC | PRN
  Administered 2019-06-12: 10 mL

## 2019-06-12 MED ORDER — PHENYLEPHRINE 40 MCG/ML (10ML) SYRINGE FOR IV PUSH (FOR BLOOD PRESSURE SUPPORT)
PREFILLED_SYRINGE | INTRAVENOUS | Status: AC
  Filled 2019-06-12: qty 10

## 2019-06-12 MED ORDER — BUPIVACAINE HCL (PF) 0.5 % IJ SOLN
INTRAMUSCULAR | Status: AC
  Filled 2019-06-12: qty 30

## 2019-06-12 MED ORDER — ROCURONIUM BROMIDE 50 MG/5ML IV SOSY
PREFILLED_SYRINGE | INTRAVENOUS | Status: DC | PRN
  Administered 2019-06-12: 50 mg via INTRAVENOUS
  Administered 2019-06-12: 20 mg via INTRAVENOUS
  Administered 2019-06-12 (×3): 50 mg via INTRAVENOUS

## 2019-06-12 MED ORDER — NITROGLYCERIN IN D5W 200-5 MCG/ML-% IV SOLN: 0.0000 ug/min | INTRAVENOUS | Status: DC

## 2019-06-12 MED ORDER — FENTANYL CITRATE (PF) 250 MCG/5ML IJ SOLN
INTRAMUSCULAR | Status: DC | PRN
  Administered 2019-06-12: 150 ug via INTRAVENOUS
  Administered 2019-06-12 (×2): 250 ug via INTRAVENOUS
  Administered 2019-06-12: 300 ug via INTRAVENOUS
  Administered 2019-06-12: 250 ug via INTRAVENOUS
  Administered 2019-06-12: 50 ug via INTRAVENOUS

## 2019-06-12 MED ORDER — OXYCODONE HCL 5 MG PO TABS
5.0000 mg | ORAL_TABLET | ORAL | Status: DC | PRN
  Administered 2019-06-13: 10 mg via ORAL
  Filled 2019-06-12: qty 2

## 2019-06-12 MED ORDER — METOPROLOL TARTRATE 12.5 MG HALF TABLET
12.5000 mg | ORAL_TABLET | Freq: Once | ORAL | Status: DC
  Filled 2019-06-12: qty 1

## 2019-06-12 MED ORDER — HEPARIN SODIUM (PORCINE) 1000 UNIT/ML IJ SOLN
INTRAMUSCULAR | Status: AC
  Filled 2019-06-12: qty 1

## 2019-06-12 MED ORDER — CHLORHEXIDINE GLUCONATE 0.12 % MT SOLN
15.0000 mL | Freq: Once | OROMUCOSAL | Status: DC
  Filled 2019-06-12: qty 15

## 2019-06-12 MED ORDER — TRAMADOL HCL 50 MG PO TABS
50.0000 mg | ORAL_TABLET | ORAL | Status: DC | PRN
  Administered 2019-06-13: 50 mg via ORAL
  Filled 2019-06-12: qty 1

## 2019-06-12 MED ORDER — BUPIVACAINE 0.5 % ON-Q PUMP SINGLE CATH 400 ML
400.0000 mL | INJECTION | Status: AC
  Administered 2019-06-12: 400 mL
  Filled 2019-06-12: qty 400

## 2019-06-12 MED ORDER — MIDAZOLAM HCL 5 MG/5ML IJ SOLN
INTRAMUSCULAR | Status: DC | PRN
  Administered 2019-06-12: 2 mg via INTRAVENOUS
  Administered 2019-06-12 (×2): 3 mg via INTRAVENOUS
  Administered 2019-06-12 (×2): 1 mg via INTRAVENOUS

## 2019-06-12 MED ORDER — DOPAMINE-DEXTROSE 3.2-5 MG/ML-% IV SOLN: 0.0000 ug/kg/min | INTRAVENOUS | Status: DC

## 2019-06-12 MED ORDER — LACTATED RINGERS IV SOLN
INTRAVENOUS | Status: DC
  Administered 2019-06-13: 05:00:00 via INTRAVENOUS

## 2019-06-12 MED ORDER — SODIUM CHLORIDE 0.9 % IV SOLN
INTRAVENOUS | Status: AC
  Administered 2019-06-12: 17:00:00 via INTRAVENOUS

## 2019-06-12 MED ORDER — CHLORHEXIDINE GLUCONATE 0.12% ORAL RINSE (MEDLINE KIT)
15.0000 mL | Freq: Two times a day (BID) | OROMUCOSAL | Status: DC
  Administered 2019-06-12: 15 mL via OROMUCOSAL

## 2019-06-12 MED ORDER — INSULIN REGULAR(HUMAN) IN NACL 100-0.9 UT/100ML-% IV SOLN: INTRAVENOUS | Status: DC

## 2019-06-12 MED ORDER — LACTATED RINGERS IV SOLN
INTRAVENOUS | Status: DC | PRN
  Administered 2019-06-12 (×2): via INTRAVENOUS

## 2019-06-12 MED ORDER — ARTIFICIAL TEARS OPHTHALMIC OINT
TOPICAL_OINTMENT | OPHTHALMIC | Status: AC
  Filled 2019-06-12: qty 3.5

## 2019-06-12 MED ORDER — BISACODYL 5 MG PO TBEC
10.0000 mg | DELAYED_RELEASE_TABLET | Freq: Every day | ORAL | Status: DC
  Administered 2019-06-13 – 2019-06-14 (×2): 10 mg via ORAL
  Filled 2019-06-12 (×4): qty 2

## 2019-06-12 MED ORDER — LIDOCAINE 2% (20 MG/ML) 5 ML SYRINGE
INTRAMUSCULAR | Status: AC
  Filled 2019-06-12: qty 5

## 2019-06-12 MED ORDER — MAGNESIUM SULFATE 4 GM/100ML IV SOLN
4.0000 g | Freq: Once | INTRAVENOUS | Status: AC
  Administered 2019-06-12: 4 g via INTRAVENOUS
  Filled 2019-06-12: qty 100

## 2019-06-12 MED ORDER — POTASSIUM CHLORIDE 10 MEQ/50ML IV SOLN
10.0000 meq | INTRAVENOUS | Status: AC
  Administered 2019-06-12 (×3): 10 meq via INTRAVENOUS

## 2019-06-12 MED ORDER — FAMOTIDINE IN NACL 20-0.9 MG/50ML-% IV SOLN
20.0000 mg | Freq: Two times a day (BID) | INTRAVENOUS | Status: AC
  Administered 2019-06-12 (×2): 20 mg via INTRAVENOUS
  Filled 2019-06-12: qty 50

## 2019-06-12 MED ORDER — HEPARIN SODIUM (PORCINE) 1000 UNIT/ML IJ SOLN
INTRAMUSCULAR | Status: DC | PRN
  Administered 2019-06-12: 27 mL via INTRAVENOUS

## 2019-06-12 MED ORDER — SODIUM CHLORIDE 0.9 % IV SOLN
INTRAVENOUS | Status: DC | PRN
  Administered 2019-06-12: 1 [IU]/h via INTRAVENOUS

## 2019-06-12 MED ORDER — PROTAMINE SULFATE 10 MG/ML IV SOLN
INTRAVENOUS | Status: DC | PRN
  Administered 2019-06-12: 30 mg via INTRAVENOUS
  Administered 2019-06-12: 140 mg via INTRAVENOUS
  Administered 2019-06-12: 100 mg via INTRAVENOUS

## 2019-06-12 MED ORDER — DEXMEDETOMIDINE HCL IN NACL 200 MCG/50ML IV SOLN
INTRAVENOUS | Status: AC
  Filled 2019-06-12: qty 50

## 2019-06-12 MED ORDER — ORAL CARE MOUTH RINSE
15.0000 mL | OROMUCOSAL | Status: DC
  Administered 2019-06-12 – 2019-06-13 (×2): 15 mL via OROMUCOSAL

## 2019-06-12 MED ORDER — MIDAZOLAM HCL 2 MG/2ML IJ SOLN: 2.0000 mg | INTRAMUSCULAR | Status: DC | PRN

## 2019-06-12 MED ORDER — EPHEDRINE SULFATE-NACL 50-0.9 MG/10ML-% IV SOSY
PREFILLED_SYRINGE | INTRAVENOUS | Status: DC | PRN
  Administered 2019-06-12: 5 mg via INTRAVENOUS

## 2019-06-12 MED ORDER — ONDANSETRON HCL 4 MG/2ML IJ SOLN
4.0000 mg | Freq: Four times a day (QID) | INTRAMUSCULAR | Status: DC | PRN
  Administered 2019-06-13 (×2): 4 mg via INTRAVENOUS
  Filled 2019-06-12 (×2): qty 2

## 2019-06-12 MED ORDER — LACTATED RINGERS IV SOLN: 500.0000 mL | Freq: Once | INTRAVENOUS | Status: DC | PRN

## 2019-06-12 MED ORDER — ACETAMINOPHEN 160 MG/5ML PO SOLN: 1000.0000 mg | Freq: Four times a day (QID) | ORAL | Status: DC

## 2019-06-12 MED ORDER — DEXMEDETOMIDINE HCL IN NACL 200 MCG/50ML IV SOLN
0.0000 ug/kg/h | INTRAVENOUS | Status: DC
  Administered 2019-06-12: 0.7 ug/kg/h via INTRAVENOUS

## 2019-06-12 MED ORDER — SODIUM CHLORIDE 0.9 % IV SOLN
INTRAVENOUS | Status: DC | PRN
  Administered 2019-06-12: 30 ug/min via INTRAVENOUS

## 2019-06-12 MED ORDER — SODIUM CHLORIDE 0.9 % IV SOLN
INTRAVENOUS | Status: DC | PRN
  Administered 2019-06-12: 0.4 ug/kg/h via INTRAVENOUS

## 2019-06-12 SURGICAL SUPPLY — 149 items
ADAPTER CARDIO PERF ANTE/RETRO (ADAPTER) ×5 IMPLANT
ADH SKN CLS APL DERMABOND .7 (GAUZE/BANDAGES/DRESSINGS) ×4
ADPR PRFSN 84XANTGRD RTRGD (ADAPTER) ×2
ARTICLIP LAA PROCLIP II 45 (Clip) ×3 IMPLANT
ATTRACTOMAT 16X20 MAGNETIC DRP (DRAPES) ×2 IMPLANT
BAG DECANTER FOR FLEXI CONT (MISCELLANEOUS) ×8 IMPLANT
BLADE CORE FAN STRYKER (BLADE) ×1 IMPLANT
BLADE SURG 11 STRL SS (BLADE) ×5 IMPLANT
CABLE ADAPT CONN TEMP 6FT (ADAPTER) ×1 IMPLANT
CANISTER SUCT 3000ML PPV (MISCELLANEOUS) ×9 IMPLANT
CANNULA FEM VENOUS REMOTE 22FR (CANNULA) IMPLANT
CANNULA FEMORAL ART 14 SM (MISCELLANEOUS) ×5 IMPLANT
CANNULA GUNDRY RCSP 15FR (MISCELLANEOUS) ×5 IMPLANT
CANNULA OPTISITE PERFUSION 16F (CANNULA) IMPLANT
CANNULA OPTISITE PERFUSION 18F (CANNULA) IMPLANT
CANNULA SUMP PERICARDIAL (CANNULA) ×9 IMPLANT
CARDIOBLATE CARDIAC ABLATION (MISCELLANEOUS)
CATH CPB KIT OWEN (MISCELLANEOUS) IMPLANT
CATH KIT ON Q 5IN SLV (PAIN MANAGEMENT) IMPLANT
CATH KIT ON-Q SILVERSOAK 5 (CATHETERS) IMPLANT
CATH KIT ON-Q SILVERSOAK 5IN (CATHETERS) ×3 IMPLANT
CATH ROBINSON RED A/P 18FR (CATHETERS) ×3 IMPLANT
CATH S G BIP PACING (CATHETERS) ×1 IMPLANT
CELLS DAT CNTRL 66122 CELL SVR (MISCELLANEOUS) ×2 IMPLANT
CLAMP OLL ABLATION (MISCELLANEOUS) ×1 IMPLANT
CONN ST 1/4X3/8  BEN (MISCELLANEOUS) ×2
CONN ST 1/4X3/8 BEN (MISCELLANEOUS) ×8 IMPLANT
CONNECTOR 1/2X3/8X1/2 3 WAY (MISCELLANEOUS) ×1
CONNECTOR 1/2X3/8X1/2 3WAY (MISCELLANEOUS) ×4 IMPLANT
CONT SPEC 4OZ CLIKSEAL STRL BL (MISCELLANEOUS) ×4 IMPLANT
COVER BACK TABLE 24X17X13 BIG (DRAPES) ×5 IMPLANT
COVER WAND RF STERILE (DRAPES) ×4 IMPLANT
CRADLE DONUT ADULT HEAD (MISCELLANEOUS) ×5 IMPLANT
DERMABOND ADVANCED (GAUZE/BANDAGES/DRESSINGS) ×2
DERMABOND ADVANCED .7 DNX12 (GAUZE/BANDAGES/DRESSINGS) ×8 IMPLANT
DEVICE ATRICLIP LAA PRCLPII 45 (Clip) IMPLANT
DEVICE CARDIOBLATE CARDIAC ABL (MISCELLANEOUS) IMPLANT
DEVICE CLOSURE PERCLS PRGLD 6F (VASCULAR PRODUCTS) IMPLANT
DEVICE PMI PUNCTURE CLOSURE (MISCELLANEOUS) ×3 IMPLANT
DEVICE SUT CK QUICK LOAD MINI (Prosthesis & Implant Heart) ×2 IMPLANT
DEVICE TROCAR PUNCTURE CLOSURE (ENDOMECHANICALS) ×5 IMPLANT
DRAIN CHANNEL 28F RND 3/8 FF (WOUND CARE) ×10 IMPLANT
DRAPE BILATERAL SPLIT (DRAPES) ×5 IMPLANT
DRAPE BRACHIAL (DRAPES) ×1 IMPLANT
DRAPE C-ARM 42X72 X-RAY (DRAPES) ×6 IMPLANT
DRAPE CV SPLIT W-CLR ANES SCRN (DRAPES) ×5 IMPLANT
DRAPE INCISE IOBAN 66X45 STRL (DRAPES) ×15 IMPLANT
DRAPE SLUSH/WARMER DISC (DRAPES) ×5 IMPLANT
DRSG AQUACEL AG ADV 3.5X10 (GAUZE/BANDAGES/DRESSINGS) ×1 IMPLANT
DRSG COVADERM 4X8 (GAUZE/BANDAGES/DRESSINGS) ×4 IMPLANT
ELECT BLADE 6.5 EXT (BLADE) ×5 IMPLANT
ELECT REM PT RETURN 9FT ADLT (ELECTROSURGICAL) ×6
ELECTRODE REM PT RTRN 9FT ADLT (ELECTROSURGICAL) ×8 IMPLANT
FELT TEFLON 1X6 (MISCELLANEOUS) ×9 IMPLANT
FEMORAL VENOUS CANN RAP (CANNULA) IMPLANT
GAUZE SPONGE 4X4 12PLY STRL (GAUZE/BANDAGES/DRESSINGS) ×2 IMPLANT
GAUZE SPONGE 4X4 12PLY STRL LF (GAUZE/BANDAGES/DRESSINGS) ×6 IMPLANT
GLOVE BIO SURGEON STRL SZ 6 (GLOVE) ×4 IMPLANT
GLOVE BIOGEL PI IND STRL 6 (GLOVE) IMPLANT
GLOVE BIOGEL PI IND STRL 6.5 (GLOVE) IMPLANT
GLOVE BIOGEL PI IND STRL 7.0 (GLOVE) IMPLANT
GLOVE BIOGEL PI IND STRL 7.5 (GLOVE) IMPLANT
GLOVE BIOGEL PI INDICATOR 6 (GLOVE) ×4
GLOVE BIOGEL PI INDICATOR 6.5 (GLOVE) ×3
GLOVE BIOGEL PI INDICATOR 7.0 (GLOVE) ×2
GLOVE BIOGEL PI INDICATOR 7.5 (GLOVE) ×1
GLOVE ORTHO TXT STRL SZ7.5 (GLOVE) ×15 IMPLANT
GOWN STRL REUS W/ TWL LRG LVL3 (GOWN DISPOSABLE) ×16 IMPLANT
GOWN STRL REUS W/TWL LRG LVL3 (GOWN DISPOSABLE) ×30
KIT BASIN OR (CUSTOM PROCEDURE TRAY) ×5 IMPLANT
KIT DILATOR VASC 18G NDL (KITS) ×6 IMPLANT
KIT DRAINAGE VACCUM ASSIST (KITS) ×1 IMPLANT
KIT SUCTION CATH 14FR (SUCTIONS) ×5 IMPLANT
KIT SUT CK MINI COMBO 4X17 (Prosthesis & Implant Heart) ×1 IMPLANT
KIT TURNOVER KIT B (KITS) ×5 IMPLANT
LEAD PACING MYOCARDI (MISCELLANEOUS) ×5 IMPLANT
LINE VENT (MISCELLANEOUS) ×1 IMPLANT
LOOP VESSEL SUPERMAXI WHITE (MISCELLANEOUS) ×1 IMPLANT
NDL AORTIC ROOT 14G 7F (CATHETERS) ×4 IMPLANT
NEEDLE AORTIC ROOT 14G 7F (CATHETERS) ×3 IMPLANT
NS IRRIG 1000ML POUR BTL (IV SOLUTION) ×23 IMPLANT
PACK E MIN INVASIVE VALVE (SUTURE) ×5 IMPLANT
PACK OPEN HEART (CUSTOM PROCEDURE TRAY) ×5 IMPLANT
PAD ARMBOARD 7.5X6 YLW CONV (MISCELLANEOUS) ×9 IMPLANT
PAD ELECT DEFIB RADIOL ZOLL (MISCELLANEOUS) ×5 IMPLANT
PERCLOSE PROGLIDE 6F (VASCULAR PRODUCTS) ×12
PROBE CRYO2-ABLATION MALLABLE (MISCELLANEOUS) ×1 IMPLANT
RETRACTOR WND ALEXIS 18 MED (MISCELLANEOUS) ×2 IMPLANT
RING TRICUSPID T28 (Prosthesis & Implant Heart) ×1 IMPLANT
RTRCTR WOUND ALEXIS 18CM MED (MISCELLANEOUS) ×3
SET CANNULATION TOURNIQUET (MISCELLANEOUS) ×5 IMPLANT
SET CARDIOPLEGIA MPS 5001102 (MISCELLANEOUS) ×1 IMPLANT
SET IRRIG TUBING LAPAROSCOPIC (IRRIGATION / IRRIGATOR) ×5 IMPLANT
SET MICROPUNCTURE 5F STIFF (MISCELLANEOUS) ×1 IMPLANT
SHEATH PINNACLE 6F 10CM (SHEATH) ×2 IMPLANT
SHEATH PINNACLE 8F 10CM (SHEATH) ×3 IMPLANT
SLEEVE REPOSITIONING LENGTH 30 (MISCELLANEOUS) ×1 IMPLANT
SOLUTION ANTI FOG 6CC (MISCELLANEOUS) ×5 IMPLANT
SUT BONE WAX W31G (SUTURE) ×5 IMPLANT
SUT ETHIBOND (SUTURE) ×2 IMPLANT
SUT ETHIBOND 2 0 SH (SUTURE) ×1 IMPLANT
SUT ETHIBOND 2 0 V4 (SUTURE) IMPLANT
SUT ETHIBOND 2 0V4 GREEN (SUTURE) IMPLANT
SUT ETHIBOND 2-0 RB-1 WHT (SUTURE) ×2 IMPLANT
SUT ETHIBOND 4 0 TF (SUTURE) IMPLANT
SUT ETHIBOND 5 0 C 1 30 (SUTURE) IMPLANT
SUT ETHIBOND NAB MH 2-0 36IN (SUTURE) ×3 IMPLANT
SUT ETHIBOND X763 2 0 SH 1 (SUTURE) ×4 IMPLANT
SUT GORETEX 6.0 TH-9 30 IN (SUTURE) IMPLANT
SUT GORETEX CV 4 TH 22 36 (SUTURE) ×4 IMPLANT
SUT GORETEX CV-5THC-13 36IN (SUTURE) IMPLANT
SUT GORETEX TH-18 36 INCH (SUTURE) IMPLANT
SUT MNCRL AB 3-0 PS2 18 (SUTURE) ×4 IMPLANT
SUT PROLENE 3 0 SH DA (SUTURE) ×11 IMPLANT
SUT PROLENE 3 0 SH1 36 (SUTURE) ×30 IMPLANT
SUT PROLENE 4 0 RB 1 (SUTURE) ×30
SUT PROLENE 4-0 RB1 .5 CRCL 36 (SUTURE) ×4 IMPLANT
SUT PROLENE 5 0 C 1 36 (SUTURE) ×4 IMPLANT
SUT PROLENE 6 0 C 1 30 (SUTURE) ×4 IMPLANT
SUT SILK  1 MH (SUTURE) ×2
SUT SILK 1 MH (SUTURE) ×14 IMPLANT
SUT SILK 1 TIES 10X30 (SUTURE) ×2 IMPLANT
SUT SILK 2 0 SH CR/8 (SUTURE) ×2 IMPLANT
SUT SILK 2 0 TIES 10X30 (SUTURE) ×2 IMPLANT
SUT SILK 2 0SH CR/8 30 (SUTURE) ×4 IMPLANT
SUT SILK 3 0 (SUTURE)
SUT SILK 3 0 SH CR/8 (SUTURE) ×2 IMPLANT
SUT SILK 3 0SH CR/8 30 (SUTURE) ×2 IMPLANT
SUT SILK 3-0 18XBRD TIE 12 (SUTURE) ×2 IMPLANT
SUT TEM PAC WIRE 2 0 SH (SUTURE) ×4 IMPLANT
SUT VIC AB 2-0 CTX 36 (SUTURE) ×4 IMPLANT
SUT VIC AB 2-0 UR6 27 (SUTURE) ×4 IMPLANT
SUT VIC AB 3-0 SH 8-18 (SUTURE) ×6 IMPLANT
SUT VICRYL 2 TP 1 (SUTURE) ×2 IMPLANT
SYR 10ML LL (SYRINGE) ×5 IMPLANT
SYSTEM SAHARA CHEST DRAIN ATS (WOUND CARE) ×7 IMPLANT
TAPE CLOTH SOFT 2X10 (GAUZE/BANDAGES/DRESSINGS) ×1 IMPLANT
TAPE CLOTH SURG 4X10 WHT LF (GAUZE/BANDAGES/DRESSINGS) ×1 IMPLANT
TOWEL GREEN STERILE (TOWEL DISPOSABLE) ×5 IMPLANT
TOWEL GREEN STERILE FF (TOWEL DISPOSABLE) ×5 IMPLANT
TRAY FOLEY SLVR 16FR TEMP STAT (SET/KITS/TRAYS/PACK) ×5 IMPLANT
TROCAR XCEL BLADELESS 5X75MML (TROCAR) ×5 IMPLANT
TROCAR XCEL NON-BLD 11X100MML (ENDOMECHANICALS) ×10 IMPLANT
TUBE SUCT INTRACARD DLP 20F (MISCELLANEOUS) ×6 IMPLANT
TUNNELER SHEATH ON-Q 11GX8 DSP (PAIN MANAGEMENT) ×1 IMPLANT
UNDERPAD 30X30 (UNDERPADS AND DIAPERS) ×5 IMPLANT
WATER STERILE IRR 1000ML POUR (IV SOLUTION) ×10 IMPLANT
WIRE .035 3MM-J 145CM (WIRE) ×5 IMPLANT
WIRE EMERALD 3MM-J .035X150CM (WIRE) ×1 IMPLANT

## 2019-06-12 NOTE — Anesthesia Procedure Notes (Signed)
Arterial Line Insertion Start/End6/17/2020 7:40 AM, 06/12/2019 7:43 AM Performed by: Orlie Dakin, CRNA, CRNA  Patient location: Pre-op. Preanesthetic checklist: patient identified, IV checked, risks and benefits discussed, surgical consent, pre-op evaluation and timeout performed Patient sedated Left, radial was placed Catheter size: 20 G Hand hygiene performed  and maximum sterile barriers used   Attempts: 1 Procedure performed without using ultrasound guided technique. Following insertion, dressing applied and Biopatch. Post procedure assessment: normal  Patient tolerated the procedure well with no immediate complications.

## 2019-06-12 NOTE — Progress Notes (Signed)
Rapid wean protocol initiated by RN and RT at this time.  Sherlie Ban, RN

## 2019-06-12 NOTE — Anesthesia Procedure Notes (Signed)
Anesthesia Procedure Image    

## 2019-06-12 NOTE — Progress Notes (Signed)
Pt remains too sleepy to lift head off pillow. Will reassess later.  Sherlie Ban, RN

## 2019-06-12 NOTE — Progress Notes (Signed)
Spoke with husband and updated him on patient. Informed them of been able to video call tomorrow to see and speak with patient .

## 2019-06-12 NOTE — Progress Notes (Signed)
OG tube not secured upon assessment, unable to auscultate air bolus. Pt is not yet waking up and may require medications down tube prior to extubation. Readjusted OG tube and secured, abd. X-ray ordered per protocol.  2130: X-ray confirmed by radiologist to be in stomach.  Sherlie Ban, RN

## 2019-06-12 NOTE — Anesthesia Procedure Notes (Signed)
Procedure Name: Intubation Date/Time: 06/12/2019 4:48 PM Performed by: Orlie Dakin, CRNA Pre-anesthesia Checklist: Patient identified, Emergency Drugs available, Suction available and Patient being monitored Oxygen Delivery Method: Circle system utilized Preoxygenation: Pre-oxygenation with 100% oxygen Laryngoscope Size: Glidescope and 4 Grade View: Grade I Tube type: Oral Tube size: 8.0 mm Number of attempts: 1 Intubation method: Soft green Cook airway exchange catheter used. Placement Confirmation: ETT inserted through vocal cords under direct vision,  positive ETCO2 and breath sounds checked- equal and bilateral Secured at: 24 cm Tube secured with: Tape Dental Injury: Teeth and Oropharynx as per pre-operative assessment  Comments: Double lumen ETT removed after Cook exchange catheter placed under DL with Glidescope.  ETT placed.

## 2019-06-12 NOTE — Interval H&P Note (Signed)
History and Physical Interval Note:  06/12/2019 7:21 AM  Adriana Hendricks  has presented today for surgery, with the diagnosis of TR MR AFIB.  The various methods of treatment have been discussed with the patient and family. After consideration of risks, benefits and other options for treatment, the patient has consented to  Procedure(s): MINIMALLY INVASIVE TRICUSPID VALVE REPAIR (Right) MINIMALLY INVASIVE MAZE PROCEDURE (N/A) possible MINIMALLY INVASIVE MITRAL VALVE REPAIR (MVR) (Right) TRANSESOPHAGEAL ECHOCARDIOGRAM (TEE) (N/A) as a surgical intervention.  The patient's history has been reviewed, patient examined, no change in status, stable for surgery.  I have reviewed the patient's chart and labs.  Questions were answered to the patient's satisfaction.     Rexene Alberts

## 2019-06-12 NOTE — Op Note (Addendum)
CARDIOTHORACIC SURGERY OPERATIVE NOTE  Date of Procedure:   06/12/2019  Preoperative Diagnosis:    Severe Tricuspid Regurgitation  Recurrent Persistent Atrial Fibrillation  Postoperative Diagnosis: Same  Procedure:    Minimally-Invasive Tricuspid Valve Repair  Complex valvuloplasty including autologous pericardial patch augmentation of anterior and posterior leaflets  Edwards mc3 Ring Annuloplasty (size 28 mm, model #4900, serial #[id number])   Minimally-Invasive Maze Procedure  Complete bilateral atrial lesion set using cryothermy and bipolar radiofrequency ablation  Clipping of Left Atrial Appendage (Atricure Pro245 left atrial clip, size 45 mm)   Insertion of Temporary Transvenous Pacemaker Lead   Ultrasound-guided cannulation of right internal jugular vein  Fluoroscopic-guided insertion of transvenous pacemaker lead   Surgeon: Valentina Gu. Roxy Manns, MD  Assistant: Enid Cutter, PA-C  Anesthesia: Albertha Ghee, MD  Operative Findings:  Rheumatic heart disease  Type IIIA tricuspid valve dysfunction with severe tricuspid regurgitation  Type IIIA mitral valve dysfunction with mild mitral regurgitation  Seivers type 0 bicuspid aortic valve with normal valve function, no aortic stenosis nor aortic insufficiency  Normal left ventricular systolic function  Dilated right ventricle with normal systolic function  Severe left and right atrial enlargement                     BRIEF CLINICAL NOTE AND INDICATIONS FOR SURGERY  Patient is an 84 year old female with history of hypertension and obstructive sleep apnea on home CPAP who has been referred for surgical consultation to discuss treatment options for management of persistent atrial fibrillation that has failed an attempt at DC cardioversion and severe tricuspid regurgitation.  Patient states that she was told she had a leaky mitral valve many years ago. She otherwise reports no significant cardiac  history. She describes a several year history of gradual progression of symptoms of exertional shortness of breath. She was diagnosed with obstructive sleep apnea and has been followed for several years by Dr. Halford Chessman.She was seen in follow-up last summer and noted to be in atrial fibrillation. She was referred for cardiac evaluation and has been followed carefully ever since by Dr. [name]. Baseline transthoracic echocardiogram performed August 02, 2018 revealed normal left ventricular size and systolic function with ejection fraction estimated 60 to 65%. There was mild mitral regurgitation. There was severe left atrial enlargement. There was severe tricuspid regurgitation. Right ventricular size and function was felt to be normal. A nuclear stress test was performed and felt to be low risk. Baseline ejection fraction was estimated 71%. There were no ST segment changes during stress.A 48-hour Holter monitor was performed revealing persistent atrial fibrillation. The patient had no evidence for any episodes of sinus rhythm. There were occasional PVCs. The patient was anticoagulated using warfarin. She underwent elective DC cardioversion December 02, 2018. She initially was converted into sinus rhythm but she promptly returned to atrial fibrillation. She was seen in follow-up in the atrial fibrillation clinic and felt to be relatively poor candidate for catheter-based ablation due to her age, the severity of left atrial enlargement, and the presence of severe tricuspid regurgitation. She was seen in follow-up by Dr. [name] and has now been referred for elective surgical consultation.  She was originally seen in consultation on January 07, 2019. She was last seen here in our office on March 04, 2019 at which time we made tentative plans for elective tricuspid valve repair, Maze procedure, and possible mitral valve repair. Plans for surgery were postponed because of the COVID-19 pandemic. I last  spoke with the patient over the  telephone on May 01, 2019 who reported at that time that she remained reasonably stable although she had continued to experience slow gradual progression of symptoms of exertional shortness of breath and fatigue. She returns to our office today with hopes to proceed with elective surgery as previously planned. She denies resting shortness of breath or chest pain. Weight is been stable. She has not developed worsening lower extremity edema or abdominal bloating. Appetite is stable. She specifically denies any productive cough, fever, or other symptoms to suggest ongoing viral illness. She has been very careful about social distancing and she has not traveled at all for the past few months. She denies any known exposure to persons with known or suspected COVID-19 infection.  The patient has been seen in consultation and counseled at length regarding the indications, risks and potential benefits of surgery.  All questions have been answered, and the patient provides full informed consent for the operation as described.    DETAILS OF THE OPERATIVE PROCEDURE  Preparation:  The patient is brought to the operating room on the above mentioned date and central monitoring was established by the anesthesia team including placement of central venous catheter through the left internal jugular vein.  A radial arterial line is placed. The patient is placed in the supine position on the operating table.  Intravenous antibiotics are administered. General endotracheal anesthesia is induced uneventfully. The patient is initially intubated using a dual lumen endotracheal tube.  A Foley catheter is placed.  Baseline transesophageal echocardiogram was performed.  Findings were notable for severe tricuspid regurgitation and mild mitral regurgitation.  The aortic valve was congenitally bicuspid but functioning normally.  There was no aortic insufficiency and no aortic stenosis.  Left  ventricular size and systolic function appeared normal.  The right ventricle was dilated but systolic function appeared normal.  There was severe enlargement of both the left and right atrium.  There was no evidence for patent foramen ovale or other interatrial communication.  All of the pulmonary veins could be entering the left atrium in normal anatomic location.  A soft roll is placed behind the patient's left scapula and the neck gently extended and turned to the left.   The patient's right neck, chest, abdomen, both groins, and both lower extremities are prepared and draped in a sterile manner. A time out procedure is performed.   Percutaneous Vascular Access:  Percutaneous arterial and venous access were obtained on the right side.  Using ultrasound guidance the right common femoral vein was cannulated using the Seldinger technique and single Perclose vascular closure devise was placed, after which time an 8 French sheath inserted.  The right common femoral artery was cannulated using a micropuncture wire and sheath.  A pair of Perclose vascular closure devices were placed at opposing 30 degree angles in the femoral artery, and a 8 French sheath inserted.  The right internal jugular vein was cannulated  using ultrasound guidance and an 8 French sheath inserted.     Surgical Approach:  A right miniature anterolateral thoracotomy incision is performed. The incision is placed just lateral to and superior to the right nipple. The pectoralis major muscle is retracted medially and completely preserved. The right pleural space is entered through the 3rd intercostal space. A soft tissue retractor is placed.  Two 11 mm ports are placed through separate stab incisions inferiorly. The right pleural space is insufflated continuously with carbon dioxide gas through the posterior port during the remainder of the operation.  A pledgeted  sutures placed through the dome of the right hemidiaphragm and retracted  inferiorly to facilitate exposure.  A longitudinal incision is made in the pericardium 3 cm anterior to the phrenic nerve and silk traction sutures are placed on either side of the incision for exposure.   Extracorporeal Cardiopulmonary Bypass and Myocardial Protection:  The patient was heparinized systemically.  The right common femoral vein is cannulated through the venous sheath and a guidewire advanced into the right atrium using TEE guidance.  The femoral vein cannulated using a 22 Fr long femoral venous cannula.  The right common femoral artery is cannulated through the arterial sheath and a guidewire advanced into the descending thoracic aorta using TEE guidance.  Femoral artery is cannulated with a 18 French femoral arterial cannula.  The right internal jugular vein is cannulated through the venous sheath and a guidewire advanced into the right atrium.  The internal jugular vein is cannulated using a 14 French pediatric femoral venous cannula.   Adequate heparinization is verified.    The entire pre-bypass portion of the operation was notable for stable hemodynamics.  Cardiopulmonary bypass was begun.  Vacuum assist venous drainage is utilized. The incision in the pericardium is extended in both directions. Venous drainage and exposure are notably excellent.  Umbilical tapes are placed around the superior vena cava and the inferior vena cava.   Clipping of Left Atrial Appendage:  The left atrial appendage is obliterated using an Atricure left atrial appendage clip (Atriclip Pro245, size 2mm).  The clip is applied under thoracoscopic visualization posterior to the aorta and pulmonary artery through the oblique sinus.  The clip was applied prior to application of the aortic crossclamp, with transesophageal echocardiographic confirmation that the clip satisfactorily obliterates the appendage.   Myocardial Protection:  An antegrade cardioplegia cannula is placed in the ascending aorta.  The  patient is cooled to 32C systemic temperature.  The aortic cross clamp is applied and cardioplegia is delivered initially in an antegrade fashion through the aortic root using modified [place] cold blood cardioplegia ([facility] blood cardioplegia protocol).   The initial cardioplegic arrest is rapid with early diastolic arrest.  Repeat doses of cardioplegia are administered at 90 minutes and every 30 minutes thereafter in order to maintain completely flat electrocardiogram.  Myocardial protection was felt to be excellent.   Maze Procedure (left atrial lesion set):  Following placement of the aortic crossclamp and the administration of the initial arresting dose of cardioplegia, a left atriotomy incision was performed through the interatrial groove and extended partially across the back wall of the left atrium after opening the oblique sinus inferiorly.  The mitral valve and floor of the left atrium are exposed using a self-retaining retractor.    The mitral valve is carefully examined.  There is evidence of rheumatic disease with severe thickening and chordal fusion involving portions of the subvalvular apparatus, particularly the cords associated with the posterior papillary muscle including the A3 and P3 segments of the anterior and posterior leaflets.  There was moderate thickening of both leaflets along the free margins.  Despite this, leaflet mobility was reasonably normal with only mild restriction and the valve remained competent.  There was no mitral stenosis.   The Atricure CryoICE nitrous oxide cryothermy system is utilized for all cryothermy ablation lesions.  The left atrial lesion set of the Cox cryomaze procedure is performed using 3 minute duration for all cryothermy lesions.  Initially a lesion is placed along the endocardial surface of the left atrium  from the caudad apex of the atriotomy incision across the posterior wall of the left atrium onto the posterior mitral annulus.  A mirror  image lesion along the epicardial surface is then performed with the probe posterior to the left atrium, crossing over the coronary sinus and extending up the epicardial surface of the posterolateral right atrium.  Two lesions are then performed to create a box isolating all of the pulmonary veins from the remainder of the left atrium.  The first lesion is placed from the cephalad apex of the atriotomy incision across the dome of the left atrium to just anterior to the left sided pulmonary veins.  The second lesion completes the box from the caudad apex of the atriotomy incision across the back wall of the left atrium to connect with the previous lesion just anterior to the left sided pulmonary veins.  The left atriotomy was closed using a 2-layer closure of running 3-0 Prolene suture after placing a sump drain across the mitral valve to serve as a left ventricular vent.     Maze Procedure (right atrial lesion set):  The inferior vena cava cannula was pulled down until the tip was just below the junction between the right atrium and the inferior vena cava. An oblique incision is made in the right atrium.  Of note, the right atrium wall is moderately thickened with diffuse scarring throughout, consistent with longstanding rheumatic disease.  Traction sutures are placed to facilitate exposure of the tricuspid valve. The tricuspid valve is inspected carefully.  The tricuspid valve is severely sclerotic with findings consistent with rheumatic heart disease.  There is severe thickening and fibrosis of the free margins of the anterior and posterior leaflets.  The septal leaflet is relatively preserved.  There is some thickening of the subvalvular apparatus but not much foreshortening.  There is severely restricted leaflet mobility.  The AtriCure Synergy bipolar radiofrequency ablation clamp is utilized to create a series of linear lesions in the right atrium, each with one limb of the clamp along the endocardial  surface and the other along the epicardial surface. The first lesion is placed from the posterior apex of the atriotomy incision and along the lateral wall of the right atrium to reach the lateral aspect of the superior vena cava, connecting with the cryothermy lesion placed previously from the coronary sinus to the epicardial surface of the posterolateral right atrium. A second lesion is placed in the opposite direction from the posterior apex of the atriotomy incision along the lateral wall to reach the lateral aspect of the inferior vena cava. A third lesion is placed from the midportion of the atriotomy incision extending at a right angle to reach the tip of the right atrial appendage. A fourth lesion is placed from the anterior apex of the atriotomy incision in an anterior and inferior direction to reach the acute margin of the heart. Finally, the cryotherapy probe is utilized to complete the right atrial lesion set by placing the probe along the endocardial surface of the right atrium from the anterior apex of the atriotomy incision to reach the tricuspid annulus at the 2:00 position. The atriotomy incision is closed with a 2 layer closure of running 4-0 Prolene suture.   Tricuspid Valve Repair:  A portion of the patient's pericardium is excised and subsequently soaked in 0.625% glutaraldehyde solution, after which time it is soaked in consecutive baths of saline for 20 minutes.  Interrupted 2-0 Ethibond horizontal mattress sutures placed circumferentially around the tricuspid annulus  with exception of the area immediately below the triangle of Koch.  The sutures will ultimately be utilized for ring annuloplasty, and at this juncture they are utilized to suspend the valve for further inspection and repair.  The valve is again carefully examined.  Simple ring annuloplasty clearly will not suffice and patch augmentation of the anterior posterior leaflets is planned.  The anterior and posterior leaflets  are mobilized off of the tricuspid annulus using an 11 blade knife.  The patch of the patient's autologous pericardium is trimmed to a generous sized elliptical shape and prepared for implantation.  Patch augmentation of the anterior posterior leaflets are performed using a 2 layer closure of running 4-0 Prolene suture circumferentially around the entire patch.  After completion of patch augmentation the valve was tested with saline.  There remains malcoaptation near the commissure between the posterior leaflet and the septal leaflet.  Several everting 4-0 Prolene sutures are utilized at the commissure.  The valve is again tested with saline and appears reasonably competent.  The tricuspid valve was sized to accept a 28 mm annuloplasty ring based upon the overall surface area of the combined anterior and posterior leaflets. An [facility] 3 annuloplasty ring (size 34mm, model #4900, serial [id number]) is implanted uneventfully. After completion of the annuloplasty the valve was tested with saline and appears to be competent.   A final dose of warm "reanimation dose" blood cardioplegia was administered antegrade through the aortic root.  The aortic cross-clamp was removed after a total cross-clamp time of 180 minutes.  The right atriotomy incision is closed using a 2 layer closure of running 4-0 Prolene suture.   Procedure Completion:  Epicardial pacing wires are fixed to the inferior wall of the right ventricule and to the right atrial appendage. The patient is rewarmed to 37C temperature. The left ventricular vent is removed.  The antegrade cardioplegia cannula removed. The patient is weaned and disconnected from cardiopulmonary bypass.  The patient's rhythm at separation from bypass was slow junctional rhythm.  AV pacing was utilized.  The patient was weaned from bypass without any inotropic support. Total cardiopulmonary bypass time for the operation was 253 minutes.  Followup transesophageal  echocardiogram performed after separation from bypass revealed a well-seated annuloplasty ring in the tricuspid position with a normal functioning tricuspid valve. There was trace residual leak.  Left and right ventricular function were unchanged from preoperatively.  There remained only mild mitral regurgitation.  The femoral arterial and venous cannulae were removed uneventfully and Perclose sutures secured.  Protamine was administered to reverse the anticoagulation. The right internal jugular cannula was removed and manual pressure held on the neck for 15 minutes.  Single lung ventilation was begun. The atriotomy closure was inspected for hemostasis. The pericardial sac was drained using a 28 French Bard drain placed through the anterior port incision.  The right pleural space is irrigated with saline solution and inspected for hemostasis. The right pleural space was drained using a 28 French Bard drain placed through the posterior port incision. The miniature thoracotomy incision was closed in multiple layers in routine fashion.    Placement of Temporary Transvenous Pacemaker Lead:  Near the end of the procedure the patient's temporary pacemaker quick capturing.  After extensive maneuvers it became clear that the ventricular pacemaker lead must have become dislodged.  Patient's underlying rhythm was junctional rhythm with heart rate in the 40s and stable blood pressure.  A decision is made to proceed with placement of temporary transvenous pacemaker  lead prior to leaving the operating room.  The patient's right neck is prepared and draped in a sterile manner.  Using ultrasound guidance the right internal jugular vein is cannulated with Seldinger technique and a 6 French sheath inserted into the right internal jugular vein.  A temporary transvenous pacemaker lead is passed through the sheath and manipulated using fluoroscopy through the right atrium and through the tricuspid annuloplasty ring into the  right ventricle.  The balloon on the pacemaker lead is deflated and the tip positioned along the interventricular septum.  Transvenous pacing is commenced and verified with good pacemaker capture thresholds.  The transvenous pacing wire and sheath are secured to the patient's neck.   Disposition:  The patient tolerated the procedure well.  The patient was reintubated using a single lumen endotracheal tube and subsequently transported to the surgical intensive care unit in stable condition. There were no intraoperative complications. All sponge instrument and needle counts were incorrect at completion of the operation.  A portable chest x-ray was performed to verify no retained instruments.  No blood products were administered during the operation.    Valentina Gu. Roxy Manns MD 06/12/2019 4:46 PM

## 2019-06-12 NOTE — Anesthesia Procedure Notes (Signed)
Procedure Name: Intubation Date/Time: 06/12/2019 9:02 AM Performed by: Orlie Dakin, CRNA Pre-anesthesia Checklist: Patient identified, Emergency Drugs available, Suction available and Patient being monitored Patient Re-evaluated:Patient Re-evaluated prior to induction Oxygen Delivery Method: Circle system utilized Preoxygenation: Pre-oxygenation with 100% oxygen Induction Type: IV induction Ventilation: Mask ventilation without difficulty Laryngoscope Size: Mac and 3 Grade View: Grade I Tube type: Oral Endobronchial tube: Left, Double lumen EBT and EBT position confirmed by fiberoptic bronchoscope and 37 Fr Number of attempts: 3 Airway Equipment and Method: Stylet Placement Confirmation: ETT inserted through vocal cords under direct vision,  positive ETCO2 and breath sounds checked- equal and bilateral Secured at: 31 cm Tube secured with: Tape Dental Injury: Teeth and Oropharynx as per pre-operative assessment  Comments: 1st DL Ricca Melgarejo, CRNA Miller 3, anterior grade 3 view.  2nd DL Blakelee Allington, CRNA MAC 3, only arytenoids viewed, unable to pass EBT.  3rd DL MAC 3 Dr Marcie Bal, cricoid pressure, grade 1 view.

## 2019-06-12 NOTE — Anesthesia Procedure Notes (Signed)
Central Venous Catheter Insertion Performed by: Myrtie Soman, MD, anesthesiologist Start/End6/17/2020 8:00 AM, 06/12/2019 8:20 AM Patient location: Pre-op. Preanesthetic checklist: patient identified, IV checked, site marked, risks and benefits discussed, surgical consent, monitors and equipment checked, pre-op evaluation, timeout performed and anesthesia consent Position: Trendelenburg Lidocaine 1% used for infiltration and patient sedated Hand hygiene performed  and maximum sterile barriers used  Catheter size: 9 Fr PA cath was placed.Sheath introducer Swan type:thermodilution PA Cath depth:45 Procedure performed using ultrasound guided technique. Ultrasound Notes:anatomy identified, needle tip was noted to be adjacent to the nerve/plexus identified, no ultrasound evidence of intravascular and/or intraneural injection and image(s) printed for medical record Attempts: 1 Following insertion, line sutured, dressing applied and Biopatch. Post procedure assessment: blood return through all ports, free fluid flow and no air  Patient tolerated the procedure well with no immediate complications.

## 2019-06-12 NOTE — Progress Notes (Signed)
      [address]       [place] [address]             [phone]      S/p TVR repair, MAZE  BP 127/76   Pulse 78   Temp 98.2 F (36.8 C) (Oral)   Resp 20   Ht 5\' 5"  (1.651 m)   Wt 76.5 kg   LMP  (LMP Unknown)   SpO2 97%   BMI 28.07 kg/m   Intake/Output Summary (Last 24 hours) at 06/12/2019 1722 Last data filed at 06/12/2019 1708 Gross per 24 hour  Intake 3800 ml  Output 3400 ml  Net 400 ml   Minimal CT output  Paced with transvenous pacer  K= 3.3- supplement  Remo Lipps C. Roxan Hockey, MD [facility] [phone]

## 2019-06-12 NOTE — Progress Notes (Signed)
Pt beginning to wake up and able to follow command to stick tongue out, but unable to hold head off pillow. Will reassess readiness to wean from ventilator at later time.  Sherlie Ban, RN

## 2019-06-12 NOTE — Transfer of Care (Signed)
Immediate Anesthesia Transfer of Care Note  Patient: Adriana Hendricks  Procedure(s) Performed: MINIMALLY INVASIVE TRICUSPID VALVE REPAIR USING EDWARDS MC3 T28 ANNULOPLASTY RING, INSERTION TEMPORARY TRANSVENOUS AV PACING LEAD (Right Chest) MINIMALLY INVASIVE MAZE PROCEDURE (N/A ) TRANSESOPHAGEAL ECHOCARDIOGRAM (TEE) (N/A )  Patient Location: ICU  Anesthesia Type:General  Level of Consciousness: sedated, unresponsive and Patient remains intubated per anesthesia plan  Airway & Oxygen Therapy: Patient remains intubated per anesthesia plan and Patient placed on Ventilator (see vital sign flow sheet for setting)  Post-op Assessment: Report given to RN and Post -op Vital signs reviewed and stable  Post vital signs: Reviewed and stable  Last Vitals:  Vitals Value Taken Time  BP    Temp    Pulse    Resp 12 06/12/19 1709  SpO2    Vitals shown include unvalidated device data.  Last Pain:  Vitals:   06/12/19 0709  TempSrc: Oral         Complications: No apparent anesthesia complications

## 2019-06-12 NOTE — Brief Op Note (Addendum)
06/12/2019  2:58 PM  PATIENT:  Adriana Hendricks  84 y.o. 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   Atrial Fibrillation   PROCEDURES:  --MINIMALLY INVASIVE TRICUSPID VALVE REPAIR USING EDWARDS MC3 T28 ANNULOPLASTY RING and AUTOLOGOUS PERICARDIAL PATCH LEAFLET AUGMENTATION  --MINIMALLY INVASIVE MAZE PROCEDURE    -PLACEMENT TEMPORARY TRANSVENOUS PACEMAKER LEAD  --TRANSESOPHAGEAL ECHOCARDIOGRAM (TEE) (N/A)  SURGEON: Rexene Alberts, MD   PHYSICIAN ASSISTANT: Roddenberry  ANESTHESIA:   general  EBL:  1100 mL  BLOOD ADMINISTERED:none  DRAINS: Mediastinal and Right Pleural Drains   LOCAL MEDICATIONS USED:  BUPIVICAINE   SPECIMEN:  No Specimen  DISPOSITION OF SPECIMEN:  N/A  COUNTS:  YES  DICTATION: .Dragon Dictation  PLAN OF CARE: Admit to inpatient   PATIENT DISPOSITION:  ICU - intubated and hemodynamically stable.   Delay start of Pharmacological VTE agent (>24hrs) due to surgical blood loss or risk of bleeding: yes

## 2019-06-13 ENCOUNTER — Inpatient Hospital Stay (HOSPITAL_COMMUNITY): Payer: Medicare Other

## 2019-06-13 ENCOUNTER — Encounter (HOSPITAL_COMMUNITY): Payer: Self-pay | Admitting: Thoracic Surgery (Cardiothoracic Vascular Surgery)

## 2019-06-13 DIAGNOSIS — I4819 Other persistent atrial fibrillation: Secondary | ICD-10-CM

## 2019-06-13 DIAGNOSIS — I442 Atrioventricular block, complete: Secondary | ICD-10-CM

## 2019-06-13 LAB — POCT I-STAT 7, (LYTES, BLD GAS, ICA,H+H)
Acid-base deficit: 5 mmol/L — ABNORMAL HIGH (ref 0.0–2.0)
Acid-base deficit: 5 mmol/L — ABNORMAL HIGH (ref 0.0–2.0)
Bicarbonate: 20.6 mmol/L (ref 20.0–28.0)
Bicarbonate: 21 mmol/L (ref 20.0–28.0)
Calcium, Ion: 1.11 mmol/L — ABNORMAL LOW (ref 1.15–1.40)
Calcium, Ion: 1.11 mmol/L — ABNORMAL LOW (ref 1.15–1.40)
HCT: 32 % — ABNORMAL LOW (ref 36.0–46.0)
HCT: 33 % — ABNORMAL LOW (ref 36.0–46.0)
Hemoglobin: 10.9 g/dL — ABNORMAL LOW (ref 12.0–15.0)
Hemoglobin: 11.2 g/dL — ABNORMAL LOW (ref 12.0–15.0)
O2 Saturation: 96 %
O2 Saturation: 96 %
Patient temperature: 37.2
Patient temperature: 37.4
Potassium: 4 mmol/L (ref 3.5–5.1)
Potassium: 4.1 mmol/L (ref 3.5–5.1)
Sodium: 138 mmol/L (ref 135–145)
Sodium: 139 mmol/L (ref 135–145)
TCO2: 22 mmol/L (ref 22–32)
TCO2: 22 mmol/L (ref 22–32)
pCO2 arterial: 41 mmHg (ref 32.0–48.0)
pCO2 arterial: 42.2 mmHg (ref 32.0–48.0)
pH, Arterial: 7.307 — ABNORMAL LOW (ref 7.350–7.450)
pH, Arterial: 7.31 — ABNORMAL LOW (ref 7.350–7.450)
pO2, Arterial: 87 mmHg (ref 83.0–108.0)
pO2, Arterial: 91 mmHg (ref 83.0–108.0)

## 2019-06-13 LAB — COOXEMETRY PANEL
Carboxyhemoglobin: 1.3 % (ref 0.5–1.5)
Methemoglobin: 1.1 % (ref 0.0–1.5)
O2 Saturation: 59.6 %
Total hemoglobin: 11.4 g/dL — ABNORMAL LOW (ref 12.0–16.0)

## 2019-06-13 LAB — BASIC METABOLIC PANEL
Anion gap: 6 (ref 5–15)
Anion gap: 8 (ref 5–15)
BUN: 12 mg/dL (ref 8–23)
BUN: 16 mg/dL (ref 8–23)
CO2: 21 mmol/L — ABNORMAL LOW (ref 22–32)
CO2: 23 mmol/L (ref 22–32)
Calcium: 7.6 mg/dL — ABNORMAL LOW (ref 8.9–10.3)
Calcium: 7.9 mg/dL — ABNORMAL LOW (ref 8.9–10.3)
Chloride: 109 mmol/L (ref 98–111)
Chloride: 104 mmol/L (ref 98–111)
Creatinine, Ser: 0.8 mg/dL (ref 0.44–1.00)
Creatinine, Ser: 1.05 mg/dL — ABNORMAL HIGH (ref 0.44–1.00)
GFR calc Af Amer: >60 mL/min (ref >60)
GFR calc Af Amer: 58 mL/min — ABNORMAL LOW (ref >60)
GFR calc non Af Amer: >60 mL/min (ref >60)
GFR calc non Af Amer: 50 mL/min — ABNORMAL LOW (ref >60)
Glucose, Bld: 119 mg/dL — ABNORMAL HIGH (ref 70–99)
Glucose, Bld: 143 mg/dL — ABNORMAL HIGH (ref 70–99)
Potassium: 3.9 mmol/L (ref 3.5–5.1)
Potassium: 4.2 mmol/L (ref 3.5–5.1)
Sodium: 136 mmol/L (ref 135–145)
Sodium: 135 mmol/L (ref 135–145)

## 2019-06-13 LAB — CBC
HCT: 34.6 % — ABNORMAL LOW (ref 36.0–46.0)
HCT: 34.3 % — ABNORMAL LOW (ref 36.0–46.0)
HCT: 31.5 % — ABNORMAL LOW (ref 36.0–46.0)
Hemoglobin: 11.9 g/dL — ABNORMAL LOW (ref 12.0–15.0)
Hemoglobin: 11.6 g/dL — ABNORMAL LOW (ref 12.0–15.0)
Hemoglobin: 10.6 g/dL — ABNORMAL LOW (ref 12.0–15.0)
MCH: 33 pg (ref 26.0–34.0)
MCH: 32.5 pg (ref 26.0–34.0)
MCH: 33 pg (ref 26.0–34.0)
MCHC: 34.4 g/dL (ref 30.0–36.0)
MCHC: 33.8 g/dL (ref 30.0–36.0)
MCHC: 33.7 g/dL (ref 30.0–36.0)
MCV: 95.8 fL (ref 80.0–100.0)
MCV: 96.1 fL (ref 80.0–100.0)
MCV: 98.1 fL (ref 80.0–100.0)
Platelets: 116 10*3/uL — ABNORMAL LOW (ref 150–400)
Platelets: 101 10*3/uL — ABNORMAL LOW (ref 150–400)
Platelets: 90 10*3/uL — ABNORMAL LOW (ref 150–400)
RBC: 3.61 MIL/uL — ABNORMAL LOW (ref 3.87–5.11)
RBC: 3.57 MIL/uL — ABNORMAL LOW (ref 3.87–5.11)
RBC: 3.21 MIL/uL — ABNORMAL LOW (ref 3.87–5.11)
RDW: 12.6 % (ref 11.5–15.5)
RDW: 12.7 % (ref 11.5–15.5)
RDW: 12.9 % (ref 11.5–15.5)
WBC: 22.7 10*3/uL — ABNORMAL HIGH (ref 4.0–10.5)
WBC: 19.3 10*3/uL — ABNORMAL HIGH (ref 4.0–10.5)
WBC: 20.5 10*3/uL — ABNORMAL HIGH (ref 4.0–10.5)
nRBC: 0 % (ref 0.0–0.2)
nRBC: 0 % (ref 0.0–0.2)
nRBC: 0 % (ref 0.0–0.2)

## 2019-06-13 LAB — GLUCOSE, CAPILLARY
Glucose-Capillary: 103 mg/dL — ABNORMAL HIGH (ref 70–99)
Glucose-Capillary: 105 mg/dL — ABNORMAL HIGH (ref 70–99)
Glucose-Capillary: 106 mg/dL — ABNORMAL HIGH (ref 70–99)
Glucose-Capillary: 112 mg/dL — ABNORMAL HIGH (ref 70–99)
Glucose-Capillary: 117 mg/dL — ABNORMAL HIGH (ref 70–99)
Glucose-Capillary: 120 mg/dL — ABNORMAL HIGH (ref 70–99)
Glucose-Capillary: 152 mg/dL — ABNORMAL HIGH (ref 70–99)

## 2019-06-13 LAB — POCT I-STAT, CHEM 8
BUN: 13 mg/dL (ref 8–23)
Calcium, Ion: 1.12 mmol/L — ABNORMAL LOW (ref 1.15–1.40)
Chloride: 104 mmol/L (ref 98–111)
Creatinine, Ser: 0.8 mg/dL (ref 0.44–1.00)
Glucose, Bld: 166 mg/dL — ABNORMAL HIGH (ref 70–99)
HCT: 33 % — ABNORMAL LOW (ref 36.0–46.0)
Hemoglobin: 11.2 g/dL — ABNORMAL LOW (ref 12.0–15.0)
Potassium: 4.4 mmol/L (ref 3.5–5.1)
Sodium: 135 mmol/L (ref 135–145)
TCO2: 21 mmol/L — ABNORMAL LOW (ref 22–32)

## 2019-06-13 LAB — CREATININE, SERUM
Creatinine, Ser: 0.85 mg/dL (ref 0.44–1.00)
GFR calc Af Amer: >60 mL/min (ref >60)
GFR calc non Af Amer: >60 mL/min (ref >60)

## 2019-06-13 LAB — MAGNESIUM
Magnesium: 3.7 mg/dL — ABNORMAL HIGH (ref 1.7–2.4)
Magnesium: 3.1 mg/dL — ABNORMAL HIGH (ref 1.7–2.4)
Magnesium: 2.8 mg/dL — ABNORMAL HIGH (ref 1.7–2.4)

## 2019-06-13 LAB — PHOSPHORUS: Phosphorus: 3 mg/dL (ref 2.5–4.6)

## 2019-06-13 MED ORDER — WARFARIN - PHYSICIAN DOSING INPATIENT
Freq: Every day | Status: DC
  Administered 2019-06-13 – 2019-06-18 (×4)

## 2019-06-13 MED ORDER — LIP MEDEX EX OINT
TOPICAL_OINTMENT | CUTANEOUS | Status: DC | PRN
  Filled 2019-06-13: qty 7

## 2019-06-13 MED ORDER — METOCLOPRAMIDE HCL 5 MG/ML IJ SOLN
10.0000 mg | Freq: Four times a day (QID) | INTRAMUSCULAR | Status: AC
  Administered 2019-06-13 – 2019-06-14 (×4): 10 mg via INTRAVENOUS
  Filled 2019-06-13 (×4): qty 2

## 2019-06-13 MED ORDER — ENOXAPARIN SODIUM 30 MG/0.3ML ~~LOC~~ SOLN
30.0000 mg | Freq: Every day | SUBCUTANEOUS | Status: DC
  Administered 2019-06-14 – 2019-06-18 (×5): 30 mg via SUBCUTANEOUS
  Filled 2019-06-13 (×5): qty 0.3

## 2019-06-13 MED ORDER — FUROSEMIDE 10 MG/ML IJ SOLN
20.0000 mg | Freq: Two times a day (BID) | INTRAMUSCULAR | Status: DC
  Administered 2019-06-13 (×2): 20 mg via INTRAVENOUS
  Filled 2019-06-13 (×2): qty 2

## 2019-06-13 MED ORDER — WARFARIN SODIUM 2.5 MG PO TABS: 2.5000 mg | ORAL_TABLET | Freq: Every day | ORAL | Status: DC

## 2019-06-13 MED ORDER — ORAL CARE MOUTH RINSE
15.0000 mL | Freq: Two times a day (BID) | OROMUCOSAL | Status: DC
  Administered 2019-06-13 – 2019-06-17 (×8): 15 mL via OROMUCOSAL

## 2019-06-13 MED ORDER — POTASSIUM CHLORIDE 10 MEQ/50ML IV SOLN
10.0000 meq | INTRAVENOUS | Status: AC
  Administered 2019-06-13 (×2): 10 meq via INTRAVENOUS
  Filled 2019-06-13: qty 50

## 2019-06-13 MED ORDER — INSULIN ASPART 100 UNIT/ML ~~LOC~~ SOLN
0.0000 [IU] | SUBCUTANEOUS | Status: DC
  Administered 2019-06-14: 2 [IU] via SUBCUTANEOUS

## 2019-06-13 MED FILL — Dexmedetomidine HCl in NaCl 0.9% IV Soln 400 MCG/100ML: INTRAVENOUS | Qty: 100 | Status: AC

## 2019-06-13 MED FILL — Heparin Sodium (Porcine) Inj 1000 Unit/ML: INTRAMUSCULAR | Qty: 30 | Status: AC

## 2019-06-13 MED FILL — Potassium Chloride Inj 2 mEq/ML: INTRAVENOUS | Qty: 40 | Status: AC

## 2019-06-13 NOTE — Progress Notes (Addendum)
[facility] DAILY ICU PROGRESS NOTE                   [address]            [place] [address]          [phone]   1 Day Post-Op Procedure(s) (LRB): MINIMALLY INVASIVE TRICUSPID VALVE REPAIR USING EDWARDS MC3 T28 ANNULOPLASTY RING, INSERTION TEMPORARY TRANSVENOUS AV PACING LEAD (Right) MINIMALLY INVASIVE MAZE PROCEDURE (N/A) TRANSESOPHAGEAL ECHOCARDIOGRAM (TEE) (N/A)  Total Length of Stay:  LOS: 1 day   Subjective: Extubated around midnight. Awake and alert, reports "general discomfort" across anterior chest. Otherwise says she is dong well.   Objective: Vital signs in last 24 hours: Temp:  [96.1 F (35.6 C)-99.9 F (37.7 C)] 99.5 F (37.5 C) (06/18 0700) Pulse Rate:  [62-80] 79 (06/18 0700) Cardiac Rhythm: A-V Sequential paced (06/18 0800) Resp:  [12-22] 18 (06/18 0700) BP: (95-120)/(58-92) 96/58 (06/18 0801) SpO2:  [86 %-100 %] 95 % (06/18 0700) Arterial Line BP: (98-132)/(55-87) 100/55 (06/18 0700) FiO2 (%):  [40 %-60 %] 40 % (06/18 0000) Weight:  [85.1 kg] 85.1 kg (06/18 0500)  Filed Weights   06/12/19 0709 06/13/19 0500  Weight: 76.5 kg 85.1 kg    Weight change:    Hemodynamic parameters for last 24 hours: CVP:  [4 mmHg-19 mmHg] 7 mmHg  Intake/Output from previous day: 06/17 0701 - 06/18 0700 In: 5770.3 [I.V.:4493.4; Blood:200; IV Piggyback:1076.9] Out: 2025 [KYHCW:2376; Emesis/NG output:5; Blood:1100; Chest Tube:660]  Intake/Output this shift: Total I/O In: 44.3 [I.V.:44.3] Out: 60 [Urine:30; Chest Tube:30]  Current Meds: Scheduled Meds: . acetaminophen  1,000 mg Oral Q6H   Or  . acetaminophen (TYLENOL) oral liquid 160 mg/5 mL  1,000 mg Per Tube Q6H  . aspirin EC  325 mg Oral Daily   Or  . aspirin  324 mg Per Tube Daily  . bisacodyl  10 mg Oral Daily   Or  . bisacodyl  10 mg Rectal Daily  . Chlorhexidine Gluconate Cloth  6 each Topical Daily  . docusate sodium  200 mg Oral Daily  . [START ON 06/14/2019] enoxaparin (LOVENOX)  injection  30 mg Subcutaneous QHS  . furosemide  20 mg Intravenous BID  . insulin aspart  0-24 Units Subcutaneous Q4H  . mouth rinse  15 mL Mouth Rinse BID  . [START ON 06/14/2019] pantoprazole  40 mg Oral Daily  . sodium chloride flush  10-40 mL Intracatheter Q12H  . sodium chloride flush  3 mL Intravenous Q12H  . warfarin  2.5 mg Oral q1800  . Warfarin - Physician Dosing Inpatient   Does not apply q1800   Continuous Infusions: . sodium chloride 20 mL/hr at 06/13/19 0800  . sodium chloride    . sodium chloride    . albumin human    . cefUROXime (ZINACEF)  IV Stopped (06/12/19 2209)  . dexmedetomidine (PRECEDEX) IV infusion Stopped (06/12/19 2132)  . DOPamine Stopped (06/12/19 1747)  . lactated ringers    . lactated ringers Stopped (06/12/19 1847)  . lactated ringers 20 mL/hr at 06/13/19 0800  . nitroGLYCERIN Stopped (06/12/19 1822)  . phenylephrine (NEO-SYNEPHRINE) Adult infusion Stopped (06/13/19 0735)  . potassium chloride     PRN Meds:.sodium chloride, albumin human, lactated ringers, metoprolol tartrate, midazolam, morphine injection, ondansetron (ZOFRAN) IV, oxyCODONE, sodium chloride flush, sodium chloride flush, traMADol  General appearance: alert, cooperative and mild distress Neurologic: intact Heart: Paced rhythm and remains pacer-dependent.  Lungs: Coarse crackles anterior. She has a  strong cough. Abdomen: soft, nontender, few bowel sounds.  Extremities: All well perfused, right groin vascular access sites clean and dry, no evidence of hematoma.  Wound: right chest incision covered with Aquacel surgical dressing.   Lab Results: CBC: Recent Labs    06/12/19 2307  06/13/19 0118 06/13/19 0400  WBC 22.7*  --   --  19.3*  HGB 11.2*  11.9*   < > 11.2* 11.6*  HCT 33.0*  34.6*   < > 33.0* 34.3*  PLT 116*  --   --  101*   < > = values in this interval not displayed.   BMET:  Recent Labs    06/10/19 1008  06/12/19 2307  06/13/19 0118 06/13/19 0400  NA 132*    < > 135   < > 139 136  K 4.3   < > 4.4   < > 4.0 3.9  CL 98  --  104  --   --  109  CO2 20*  --   --   --   --  21*  GLUCOSE 117*   < > 166*  --   --  119*  BUN 15  --  13  --   --  12  CREATININE 0.75  --  0.80  0.85  --   --  0.80  CALCIUM 9.3  --   --   --   --  7.6*   < > = values in this interval not displayed.    CMET: Lab Results  Component Value Date   WBC 19.3 (H) 06/13/2019   HGB 11.6 (L) 06/13/2019   HCT 34.3 (L) 06/13/2019   PLT 101 (L) 06/13/2019   GLUCOSE 119 (H) 06/13/2019   CHOL 191 09/18/2018   TRIG 106.0 09/18/2018   HDL 53.70 09/18/2018   LDLDIRECT 128.8 01/17/2011   LDLCALC 116 (H) 09/18/2018   ALT 25 06/10/2019   AST 42 (H) 06/10/2019   NA 136 06/13/2019   K 3.9 06/13/2019   CL 109 06/13/2019   CREATININE 0.80 06/13/2019   BUN 12 06/13/2019   CO2 21 (L) 06/13/2019   TSH 2.40 09/18/2018   INR 1.8 (H) 06/12/2019   HGBA1C 5.4 06/10/2019      PT/INR:  Recent Labs    06/12/19 1716  LABPROT 20.4*  INR 1.8*   Radiology: Dg Chest 1 View  Result Date: 06/12/2019 CLINICAL DATA:  Insertion of temporary transvenous AV pacing lead. EXAM: DG C-ARM 61-120 MIN; CHEST  1 VIEW COMPARISON:  Chest radiograph earlier this day. FINDINGS: Single fluoroscopic spot view of the lower chest during surgery. Multiple monitoring leads valvuloplasty. Pacing lead projects in the region of the right ventricle. Valvuloplasty noted. Total fluoroscopy time 1 minutes 7 seconds. Please reference operative report for details. IMPRESSION: Single fluoroscopic spot view during reported insertion of temporary transvenous AV pacing lead. Electronically Signed   By: Keith Rake M.D.   On: 06/12/2019 19:02   Dg Chest Port 1 View  Addendum Date: 06/12/2019   ADDENDUM REPORT: 06/12/2019 19:13 ADDENDUM: Old right clavicle fracture. Old posttraumatic or postsurgical deformity distal left clavicle Electronically Signed   By: Donavan Foil M.D.   On: 06/12/2019 19:13   Result Date:  06/12/2019 CLINICAL DATA:  Atelectasis EXAM: PORTABLE CHEST 1 VIEW COMPARISON:  06/12/2019 FINDINGS: Endotracheal tube tip is about 4 cm superior to the carina. Distal esophageal tube deviates to the left, the tip projects over the GE junction region. Postsurgical changes of the mediastinum with valvuloplasty.  Multiple leads overlying the chest. Left IJ catheter sheath terminates superior to the aortic arch. Right IJ pacing lead appears looped over the right atrium with tip projecting over right ventricle. Additional tubing over the right neck and terminating over the mid mediastinum. Small bilateral pleural effusions. Cardiomegaly. Asymmetric hazy opacity in the right thorax. Chest tubes remain in place. IMPRESSION: 1. Support lines and tubes as above. Esophageal tube has been placed, there is mild leftward deviation of the distal tube, possibly due to tortuous esophageal course and augmented by rotation. The tip of the tube projects over the GE junction and further advancement is suggested for more optimal positioning. 2. Interim placement of right jugular line/pacing lead which appears looped over the right atrial region at with tip projecting over right ventricular region. There is additional similar sized catheter tubing extending from the right neck to the mid mediastinum, uncertain if this is tubing external to the patient or an additional central line. If this represents the latter (central line), intravenous location cannot be confirmed. 3. Cardiomegaly. Small bilateral pleural effusions. Asymmetric ground-glass opacity in the right thorax, possible asymmetric edema, similar compared to prior. Electronically Signed: By: Donavan Foil M.D. On: 06/12/2019 19:08   Dg Chest Portable 1 View  Result Date: 06/12/2019 CLINICAL DATA:  Sponge and needle count EXAM: PORTABLE CHEST 1 VIEW COMPARISON:  06/10/2019 FINDINGS: Interval intubation, tip of the endotracheal tube is about 5.5 cm superior to the carina.  Transesophageal probe over the lower mediastinum. Valvuloplasty. Right-sided chest tube with tip over the apex. Lower chest drainage catheter. Multiple leads are coiled over the patient's chest. No definite radiopaque foreign body is visualized. Clips over the upper cardiac silhouette. Left IJ central venous sheath terminates superior to the aortic knob. Cardiomegaly with aortic atherosclerosis. Linear atelectasis at the left base. Probable tiny right effusion and mild peripheral airspace disease in the right thorax. No pneumothorax. IMPRESSION: 1. Placement of support lines and tubes as above. No unanticipated radiopaque foreign body is visualized. Interval postsurgical changes of the mediastinum. 2. Cardiomegaly. Probable trace right effusion with patchy ground-glass opacity in the right thorax, possible mild asymmetric edema. 3. Subsegmental atelectasis at the left base. Critical Value/emergent results were called by telephone at the time of interpretation on 06/12/2019 at 4:30 pm to [facility] in [place], who verbally acknowledged these results. Electronically Signed   By: Donavan Foil M.D.   On: 06/12/2019 16:59   Dg Abd Portable 1v  Result Date: 06/12/2019 CLINICAL DATA:  Encounter for orogastric tube placement. EXAM: PORTABLE ABDOMEN - 1 VIEW COMPARISON:  Chest radiograph earlier this day at 1807 hour FINDINGS: Tip of the enteric tube is below the diaphragm in the stomach, side-port likely in the region of the gastroesophageal junction. Nonobstructive bowel gas pattern in the upper abdomen. IMPRESSION: Tip of the enteric tube below the diaphragm in the stomach, side-port likely in the region of the gastroesophageal junction. Recommend advancement of approximately 5 cm for optimal placement. Electronically Signed   By: Keith Rake M.D.   On: 06/12/2019 21:21   Dg C-arm 1-60 Min  Result Date: 06/12/2019 CLINICAL DATA:  Insertion of temporary transvenous AV pacing lead. EXAM: DG C-ARM 61-120 MIN; CHEST  1  VIEW COMPARISON:  Chest radiograph earlier this day. FINDINGS: Single fluoroscopic spot view of the lower chest during surgery. Multiple monitoring leads valvuloplasty. Pacing lead projects in the region of the right ventricle. Valvuloplasty noted. Total fluoroscopy time 1 minutes 7 seconds. Please reference operative report for details. IMPRESSION:  Single fluoroscopic spot view during reported insertion of temporary transvenous AV pacing lead. Electronically Signed   By: Keith Rake M.D.   On: 06/12/2019 19:02     Assessment/Plan: S/P Procedure(s) (LRB): MINIMALLY INVASIVE TRICUSPID VALVE REPAIR USING EDWARDS MC3 T28 ANNULOPLASTY RING, INSERTION TEMPORARY TRANSVENOUS AV PACING LEAD (Right) MINIMALLY INVASIVE MAZE PROCEDURE (N/A) TRANSESOPHAGEAL ECHOCARDIOGRAM (TEE) (N/A)  -POD1 complex tricuspid valve repair and MAZE for rheumatic valve disease,severe TR, and chronic afib . Not requiring any inotropic or vasoactive support. She is pacer-dependent. Currently AV sequential paced utilizing a temporary transvenous ventricular lead and atrial epicardial pacing wires. Will ask EP service to eval and follow. May eventually require PPM.  Will initiate anticoagulation, check INR daily.   Leave CT's today  -Expected mild respiratory insufficiency- continue working on pulmonary hygiene and O2 weak.  -Volume excess-Wt up 8.5kg from pre-op wt. Initiate lasix IV BID  -History of HTN--BP is acceptable without BP meds currently. Continue to monitor, avoid beta blockers until rhythm issues resolved.  -DVT PPX- continue SCD's, add Lovenox tomorrow.    Antony Odea, PA-C [phone] 06/13/2019 8:32 AM   I have seen and examined the patient and agree with the assessment and plan as outlined.  Overall doing well POD1 but she remains pacer-dependent with profound bradycardia underneath pacer.  Temporary wire in good position on CXR and functioning well, but will need to limit physical activity  for now to avoid the possibility of dislodging and loss of capture.  Will ask EPS to see in consult as we may need to consider PPM placement if underlying rhythm doesn't show significant signs of improvement soon.   Rexene Alberts, MD 06/13/2019 9:19 AM

## 2019-06-13 NOTE — Progress Notes (Signed)
Patient ID: Adriana Hendricks, female   DOB: 1935/08/15, 84 y.o.   MRN: [id number] [facility] Evening Rounds:  Hemodynamically stable AV paced 80 Urine output ok CT output 30/hr.  CBC    Component Value Date/Time   WBC 20.5 (H) 06/13/2019 1605   RBC 3.21 (L) 06/13/2019 1605   HGB 10.6 (L) 06/13/2019 1605   HGB 13.9 01/15/2019 1536   HCT 31.5 (L) 06/13/2019 1605   HCT 39.4 01/15/2019 1536   PLT 90 (L) 06/13/2019 1605   PLT 184 01/15/2019 1536   MCV 98.1 06/13/2019 1605   MCV 90 01/15/2019 1536   MCH 33.0 06/13/2019 1605   MCHC 33.7 06/13/2019 1605   RDW 12.9 06/13/2019 1605   RDW 13.2 01/15/2019 1536   LYMPHSABS 1.0 08/23/2013 1036   MONOABS 0.5 08/23/2013 1036   EOSABS 0.1 08/23/2013 1036   BASOSABS 0.0 08/23/2013 1036   BMET    Component Value Date/Time   NA 136 06/13/2019 0400   NA 135 01/15/2019 1536   K 3.9 06/13/2019 0400   CL 109 06/13/2019 0400   CO2 21 (L) 06/13/2019 0400   GLUCOSE 119 (H) 06/13/2019 0400   BUN 12 06/13/2019 0400   BUN 23 01/15/2019 1536   CREATININE 0.80 06/13/2019 0400   CALCIUM 7.6 (L) 06/13/2019 0400   GFRNONAA >60 06/13/2019 0400   GFRAA >60 06/13/2019 0400

## 2019-06-13 NOTE — Progress Notes (Signed)
Assisted tele visit to patient with family member.  Jaxx Huish R, RN  

## 2019-06-13 NOTE — Procedures (Signed)
Extubation Procedure Note  Patient Details:   Name: Adriana Hendricks DOB: Dec 03, 1935 MRN: [id number]   Airway Documentation:    Vent end date: 06/13/19 Vent end time: 0013   Evaluation  O2 sats: stable throughout Complications: No apparent complications Patient did tolerate procedure well. Bilateral Breath Sounds: Diminished   Yes  Patient tolerated rapid wean. NIF -24 and VC 0.75 L. Positive for cuff leak. Patient extubated to a 4 Lpm nasal cannula. No signs of dyspnea or stridor noted. Patient instructed on the Incentive Spirometer achieving 500 mL three times. Patient resting comfortably. RN at bedside.   [name] 06/13/2019, 12:21 AM

## 2019-06-13 NOTE — Consult Note (Addendum)
ELECTROPHYSIOLOGY CONSULT NOTE    Patient ID: Adriana Hendricks MRN: [id number], DOB/AGE: 82-Aug-1936 82 y.o.  Admit date: 06/12/2019 Date of Consult: 06/13/2019  Primary Physician: Marin Olp, MD Primary Cardiologist: [name] Electrophysiologist: Curt Bears (new this admission)  Patient Profile: Adriana Hendricks is a 84 y.o. female with a history of persistent atrial fibrillation, severe TR s/p MAZE and TVR this admission who is being seen today for the evaluation of heart block at the request of Dr Roxy Manns.  HPI:  Adriana Hendricks is a 84 y.o. female with the above past medical history. She has a several year history of progressive shortness of breath on exertion.  She was found to have persistent atrial fibrillation as well as severe TR and mild MR. She was referred to Dr Roxy Manns and underwent TVR and MAZE this admission. Post op, she has been in complete heart block and is pacer dependent.  EP has been asked to evaluate for treatment options. She is extubated, awake and alert.  She still has chest tube in place and left IJ.  She is complaining of general chest pain when taking a deep breath.    TEE 12/2018 demonstrated EF 55-60%, mild to moderate MR, severe TR  Past Medical History:  Diagnosis Date  . Achilles tendon rupture   . Arthritis of hand, right   . Carpal tunnel syndrome 09/04/2013  . Collagenous colitis 2005  . Colon polyps 2010   Tubular adenoma and hyperplastic  . Dental infection 10/25/2014  . Diverticulosis   . Esophageal stenosis   . Essential tremor   . GERD (gastroesophageal reflux disease)    hx hiatal hernia, hx esophagitis, hx stricture  . Heart murmur   . Hiatal hernia   . Hx: UTI (urinary tract infection)   . Hypertension   . Lower extremity neuropathy 08/23/2013   On B12 therapy with numbness in the feet bilaterally no evidence of diabetes   . Ocular migraine    jagged vision, a few per month  . OSA (obstructive sleep apnea) 12/21/2016   on CPAP  .  Peripheral neuropathy    treated by Dr Posey Pronto (07/2015)  . Persistent atrial fibrillation    Rate control with beta-blocker and anticoagulated with warfarin  . S/P Maze operation for atrial fibrillation 06/12/2019   Complete bilateral atrial lesion set using cryothermy and bipolar radiofrequency ablation with clipping of LA appendage via right mini thoracotomy approach  . S/P minimally invasive tricuspid valve repair 06/12/2019   Complex valvuloplasty including autologous pericardial patch augmentation of anterior and posterior leaflets with 28 mm Edwards mc3 ring annuloplasty via right mini thoracotomy approach  . Severe tricuspid regurgitation 07/2018   Noted Aug 2019 during a fib workup. Severe LAE as well     Surgical History:  Past Surgical History:  Procedure Laterality Date  . 48 hr Holter Monitor  07/2018   Persistent Afib (rate 33 - 124 bpm)  . APPENDECTOMY    . CARDIAC CATHETERIZATION    . CARDIOVERSION N/A 10/02/2018   Procedure: CARDIOVERSION;  Surgeon: Acie Fredrickson, Wonda Cheng, MD;  Location: [facility] ENDOSCOPY;  Service: Cardiovascular;  Laterality: N/A;  . CARPAL TUNNEL RELEASE    . CATARACT EXTRACTION    . EXCISION MORTON'S NEUROMA     Right foot  . EYE SURGERY     Cataracts with implants-bilateral  . INTRAOCULAR LENS IMPLANT, SECONDARY    . Moles removed    . MOUTH SURGERY     (For Exostosis)  . NUCLEAR  07/2018   EF 71 %. LOW RISK - no ischemia or Infarct.   Marland Kitchen RIGHT/LEFT HEART CATH AND CORONARY ANGIOGRAPHY N/A 01/16/2019   Procedure: RIGHT/LEFT HEART CATH AND CORONARY ANGIOGRAPHY;  Surgeon: Leonie Man, MD;  Location: [facility];; Angiographically normal coronary arteries.  Normal LVEDP. Upper Limit of Normal PAP~23 mmHg & PCWP 18 mmHg.  LVEDP of 7 mmHg. -With mean PAP of 23 mmHg there is a TPG suggesting a primary pulmonary etiology.  Marland Kitchen ROTATOR CUFF REPAIR    . TEE WITHOUT CARDIOVERSION N/A 01/16/2019   Procedure: TRANSESOPHAGEAL ECHOCARDIOGRAM (TEE);  Surgeon: Sueanne Margarita, MD;  Location: [facility] ENDOSCOPY;;  Severe TR due to poor coaptation of the leaflets from annular dilation.  Mild to moderate mitral vegetation with mildly restricted mobility of the posterior leaflet.  EF 55-60% with no R WMA.  Severe RA and LA dilation.  . TONSILLECTOMY    . TRANSTHORACIC ECHOCARDIOGRAM  07/2018   Normal LV Fxn (EF 60-65%) no RWMA.  Severe LA dilation & Severe TR.     Medications Prior to Admission  Medication Sig Dispense Refill Last Dose  . B Complex Vitamins (B COMPLEX 100 PO) Take 1 tablet by mouth daily.    Past Week at Unknown time  . calcium carbonate (TUMS - DOSED IN MG ELEMENTAL CALCIUM) 500 MG chewable tablet Chew 1 tablet by mouth daily.    Past Week at Unknown time  . chlorthalidone (HYGROTON) 25 MG tablet Take 1 tablet (25 mg total) by mouth daily. 90 tablet 1 06/11/2019 at Unknown time  . cholecalciferol (VITAMIN D3) 25 MCG (1000 UT) tablet Take 1,000 Units by mouth daily.   Past Week at Unknown time  . famotidine (PEPCID) 20 MG tablet Take 20 mg by mouth at bedtime.   06/11/2019 at Unknown time  . hydroxypropyl methylcellulose / hypromellose (ISOPTO TEARS / GONIOVISC) 2.5 % ophthalmic solution Place 1 drop into both eyes 4 (four) times daily as needed for dry eyes.   06/11/2019 at Unknown time  . Multiple Vitamins-Minerals (PRESERVISION AREDS 2 PO) Take 2 tablets by mouth 2 (two) times a day.   Past Week at Unknown time  . Multiple Vitamins-Minerals (PRESERVISION AREDS PO) Take 2 tablets by mouth daily.   Past Week at Unknown time  . nadolol (CORGARD) 40 MG tablet Take 0.5 tablets (20 mg total) by mouth daily. (Patient taking differently: Take 40 mg by mouth daily. ) 903 tablet 3 06/12/2019 at 0545  . warfarin (COUMADIN) 5 MG tablet Take 1 tablet (5 mg total) by mouth every Tuesday, Thursday, and Saturday at 6 PM. Take 1/2 to 1 tablet daily as directed by coumadin clinic 90 tablet 1 06/04/2019    Inpatient Medications:  . acetaminophen  1,000 mg Oral Q6H   Or  .  acetaminophen (TYLENOL) oral liquid 160 mg/5 mL  1,000 mg Per Tube Q6H  . aspirin EC  325 mg Oral Daily   Or  . aspirin  324 mg Per Tube Daily  . bisacodyl  10 mg Oral Daily   Or  . bisacodyl  10 mg Rectal Daily  . Chlorhexidine Gluconate Cloth  6 each Topical Daily  . docusate sodium  200 mg Oral Daily  . [START ON 06/14/2019] enoxaparin (LOVENOX) injection  30 mg Subcutaneous QHS  . furosemide  20 mg Intravenous BID  . insulin aspart  0-24 Units Subcutaneous Q4H  . mouth rinse  15 mL Mouth Rinse BID  . [START ON 06/14/2019] pantoprazole  40 mg Oral Daily  . sodium chloride flush  10-40 mL Intracatheter Q12H  . sodium chloride flush  3 mL Intravenous Q12H  . warfarin  2.5 mg Oral q1800  . Warfarin - Physician Dosing Inpatient   Does not apply q1800    Allergies:  Allergies  Allergen Reactions  . Levofloxacin Other (See Comments)    Developed tendon pain. (Has a history of a Achilles tendon tear)  . Potassium Chloride Hives  . Clarithromycin Nausea Only  . Erythromycin Base Nausea And Vomiting  . Metronidazole Rash  . Penicillins Rash    Did it involve swelling of the face/tongue/throat, SOB, or low BP? No Did it involve sudden or severe rash/hives, skin peeling, or any reaction on the inside of your mouth or nose? No Did you need to seek medical attention at a hospital or doctor's office? No When did it last happen? as a child If all above answers are "NO", may proceed with cephalosporin use.     Social History   Socioeconomic History  . Marital status: Married    Spouse name: Not on file  . Number of children: 2  . Years of education: Not on file  . Highest education level: Not on file  Occupational History  . Occupation: Retired  Scientific laboratory technician  . Financial resource strain: Not on file  . Food insecurity    Worry: Not on file    Inability: Not on file  . Transportation needs    Medical: Not on file    Non-medical: Not on file  Tobacco Use  . Smoking status:  Former Smoker    Packs/day: 2.00    Years: 13.00    Pack years: 26.00    Types: Cigarettes    Start date: 12/26/1954    Quit date: 03/26/1968    Years since quitting: 51.2  . Smokeless tobacco: Never Used  . Tobacco comment: Quit in 1969 - smokeed 2-3PPD , was 3 PPD at her heaviest for about 3 years.   Substance and Sexual Activity  . Alcohol use: Yes    Alcohol/week: 2.0 standard drinks    Types: 2 Glasses of wine per week    Comment: 2 glass of wine per day  . Drug use: No  . Sexual activity: Yes  Lifestyle  . Physical activity    Days per week: Not on file    Minutes per session: Not on file  . Stress: Not on file  Relationships  . Social Herbalist on phone: Not on file    Gets together: Not on file    Attends religious service: Not on file    Active member of club or organization: Not on file    Attends meetings of clubs or organizations: Not on file    Relationship status: Not on file  . Intimate partner violence    Fear of current or ex partner: Not on file    Emotionally abused: Not on file    Physically abused: Not on file    Forced sexual activity: Not on file  Other Topics Concern  . Not on file  Social History Narrative   Married. Husband is patient of Dr. Yong Channel.    Married 1958. 2 children. 4 grandkids (3 girls, 1 boy).    Retired from [employer].   Hobbies: genealogy and DNA     Family History  Problem Relation Age of Onset  . Rectal cancer Father  colostomy  . Heart failure Father        Died, 89  . Other Mother        Died, 47. old age.   . Barrett's esophagus Son   . Colon cancer Neg Hx   . Esophageal cancer Neg Hx   . Stomach cancer Neg Hx      Review of Systems: All other systems reviewed and are otherwise negative except as noted above.  Physical Exam: Vitals:   06/13/19 0500 06/13/19 0600 06/13/19 0700 06/13/19 0801  BP: 111/82 95/69 101/68 (!) 96/58  Pulse: 80 80 79   Resp: 14 17 18     Temp: 99.9 F (37.7 C) 99.9 F (37.7 C) 99.5 F (37.5 C)   TempSrc:      SpO2: 98% 97% 95%   Weight: 85.1 kg     Height: 5\' 5"  (1.651 m)       GEN- The patient is elderly appearing, alert and oriented x 3 today.   HEENT: normocephalic, atraumatic; sclera clear, conjunctiva pink; hearing intact; oropharynx clear; +left IJ, right temp pacing wire Lungs- Clear to ausculation bilaterally, normal work of breathing.  No wheezes, rales, rhonchi Heart- Regular rate and rhythm (paced) GI- soft, non-tender, non-distended, bowel sounds present Extremities- no clubbing, cyanosis MS- no significant deformity or atrophy Skin- warm and dry, no rash or lesion Psych- euthymic mood, full affect Neuro- strength and sensation are intact  Labs:   Lab Results  Component Value Date   WBC 19.3 (H) 06/13/2019   HGB 11.6 (L) 06/13/2019   HCT 34.3 (L) 06/13/2019   MCV 96.1 06/13/2019   PLT 101 (L) 06/13/2019    Recent Labs  Lab 06/10/19 1008  06/13/19 0400  NA 132*   < > 136  K 4.3   < > 3.9  CL 98   < > 109  CO2 20*  --  21*  BUN 15   < > 12  CREATININE 0.75   < > 0.80  CALCIUM 9.3  --  7.6*  PROT 7.1  --   --   BILITOT 1.6*  --   --   ALKPHOS 82  --   --   ALT 25  --   --   AST 42*  --   --   GLUCOSE 117*   < > 119*   < > = values in this interval not displayed.      Radiology/Studies: Dg Chest Portable 1 View  Result Date: 06/12/2019 CLINICAL DATA:  Sponge and needle count EXAM: PORTABLE CHEST 1 VIEW COMPARISON:  06/10/2019 FINDINGS: Interval intubation, tip of the endotracheal tube is about 5.5 cm superior to the carina. Transesophageal probe over the lower mediastinum. Valvuloplasty. Right-sided chest tube with tip over the apex. Lower chest drainage catheter. Multiple leads are coiled over the patient's chest. No definite radiopaque foreign body is visualized. Clips over the upper cardiac silhouette. Left IJ central venous sheath terminates superior to the aortic knob.  Cardiomegaly with aortic atherosclerosis. Linear atelectasis at the left base. Probable tiny right effusion and mild peripheral airspace disease in the right thorax. No pneumothorax. IMPRESSION: 1. Placement of support lines and tubes as above. No unanticipated radiopaque foreign body is visualized. Interval postsurgical changes of the mediastinum. 2. Cardiomegaly. Probable trace right effusion with patchy ground-glass opacity in the right thorax, possible mild asymmetric edema. 3. Subsegmental atelectasis at the left base. Critical Value/emergent results were called by telephone at the time of interpretation on 06/12/2019 at 4:30  pm to Adriana Hendricks in [place], who verbally acknowledged these results. Electronically Signed   By: Donavan Foil M.D.   On: 06/12/2019 16:59    EKG:pre-op, atrial fibrillation (personally reviewed)  TELEMETRY: AV paced at 80 (personally reviewed)  Assessment/Plan: 1.  Complete heart block S/p TVR and MAZE She is pacer dependent today and may require permanent pacing prior to discharge Would prefer to have chest tube and Left IJ removed prior to proceeding. She is currently off pressor support  2.  Persistent atrial fibrillation S/p MAZE CHADS2VASC is 4 Continue Warfarin   3.  Severe TR S/p TVR this admission  Per CT surgery   Dr Curt Bears to see later today. EP to follow    For questions or updates, please contact [place] Please consult [email or web address] for contact info under Cardiology/STEMI.  Signed, Chanetta Marshall, NP 06/13/2019 9:09 AM      I have seen and examined this patient with Chanetta Marshall.  Agree with above, note added to reflect my findings.  On exam, RRR, no murmurs, lungs clear. Patient s/p TV repair and MAZE. Is now pacer dependent with temporary wire in the right IJ. Has left IJ line as well. She is currently dependent. If she continues to have complete heart block, Adriana Hendricks need pacemaker. Would hopefully be able to take lines out of her IJ prior to  insertion. Adriana Hendricks continue to follow along.    Loriene Taunton M. Sanchez Hemmer MD 06/13/2019 2:17 PM

## 2019-06-13 NOTE — Progress Notes (Signed)
RT placed pt on CPAP dream station for the night in auto titrate with home settings of max 9 min 5 with 2 Lpm bled into the system. Pt respiratory status is stable at this time. Pt tolerating well. RT will continue to monitor.

## 2019-06-13 NOTE — Progress Notes (Signed)
Paused temp pacer to assess underlying rhythm prior to ECG tech arrival. Pt pacer dependent, asystole/sinus pause underneath. Rhythm strip printed and saved to chart. Will defer ECG until safe to do so.  Sherlie Ban, RN

## 2019-06-14 ENCOUNTER — Inpatient Hospital Stay (HOSPITAL_COMMUNITY): Payer: Medicare Other

## 2019-06-14 DIAGNOSIS — I495 Sick sinus syndrome: Secondary | ICD-10-CM

## 2019-06-14 LAB — CBC
HCT: 31.1 % — ABNORMAL LOW (ref 36.0–46.0)
Hemoglobin: 10.1 g/dL — ABNORMAL LOW (ref 12.0–15.0)
MCH: 32.3 pg (ref 26.0–34.0)
MCHC: 32.5 g/dL (ref 30.0–36.0)
MCV: 99.4 fL (ref 80.0–100.0)
Platelets: 95 10*3/uL — ABNORMAL LOW (ref 150–400)
RBC: 3.13 MIL/uL — ABNORMAL LOW (ref 3.87–5.11)
RDW: 13.1 % (ref 11.5–15.5)
WBC: 22.3 10*3/uL — ABNORMAL HIGH (ref 4.0–10.5)
nRBC: 0 % (ref 0.0–0.2)

## 2019-06-14 LAB — GLUCOSE, CAPILLARY
Glucose-Capillary: 93 mg/dL (ref 70–99)
Glucose-Capillary: 108 mg/dL — ABNORMAL HIGH (ref 70–99)
Glucose-Capillary: 97 mg/dL (ref 70–99)
Glucose-Capillary: 132 mg/dL — ABNORMAL HIGH (ref 70–99)
Glucose-Capillary: 117 mg/dL — ABNORMAL HIGH (ref 70–99)

## 2019-06-14 LAB — PROTIME-INR
INR: 1.4 — ABNORMAL HIGH (ref 0.8–1.2)
Prothrombin Time: 17.1 seconds — ABNORMAL HIGH (ref 11.4–15.2)

## 2019-06-14 LAB — BASIC METABOLIC PANEL
Anion gap: 9 (ref 5–15)
BUN: 22 mg/dL (ref 8–23)
CO2: 24 mmol/L (ref 22–32)
Calcium: 8.3 mg/dL — ABNORMAL LOW (ref 8.9–10.3)
Chloride: 102 mmol/L (ref 98–111)
Creatinine, Ser: 1.32 mg/dL — ABNORMAL HIGH (ref 0.44–1.00)
GFR calc Af Amer: 44 mL/min — ABNORMAL LOW (ref >60)
GFR calc non Af Amer: 38 mL/min — ABNORMAL LOW (ref >60)
Glucose, Bld: 127 mg/dL — ABNORMAL HIGH (ref 70–99)
Potassium: 4.2 mmol/L (ref 3.5–5.1)
Sodium: 135 mmol/L (ref 135–145)

## 2019-06-14 MED ORDER — SODIUM CHLORIDE 0.9% FLUSH: 3.0000 mL | INTRAVENOUS | Status: DC | PRN

## 2019-06-14 MED ORDER — FUROSEMIDE 10 MG/ML IJ SOLN
10.0000 mg/h | INTRAVENOUS | Status: AC
  Administered 2019-06-14 – 2019-06-16 (×4): 10 mg/h via INTRAVENOUS
  Filled 2019-06-14 (×3): qty 25
  Filled 2019-06-14: qty 5
  Filled 2019-06-14: qty 25

## 2019-06-14 MED ORDER — SODIUM CHLORIDE 0.9 % IV SOLN: 250.0000 mL | INTRAVENOUS | Status: DC | PRN

## 2019-06-14 MED ORDER — SODIUM CHLORIDE 0.9% FLUSH
3.0000 mL | Freq: Two times a day (BID) | INTRAVENOUS | Status: DC
  Administered 2019-06-14 – 2019-06-17 (×7): 3 mL via INTRAVENOUS

## 2019-06-14 MED ORDER — WARFARIN SODIUM 2 MG PO TABS
2.0000 mg | ORAL_TABLET | Freq: Every day | ORAL | Status: DC
  Administered 2019-06-14 – 2019-06-15 (×2): 2 mg via ORAL
  Filled 2019-06-14 (×2): qty 1

## 2019-06-14 MED ORDER — ASPIRIN EC 81 MG PO TBEC
81.0000 mg | DELAYED_RELEASE_TABLET | Freq: Every day | ORAL | Status: DC
  Administered 2019-06-15 – 2019-06-19 (×5): 81 mg via ORAL
  Filled 2019-06-14 (×6): qty 1

## 2019-06-14 MED ORDER — MOVING RIGHT ALONG BOOK
Freq: Once | Status: AC
  Administered 2019-06-14: 09:00:00
  Filled 2019-06-14: qty 1

## 2019-06-14 NOTE — Progress Notes (Addendum)
T[facility] DAILY ICU PROGRESS NOTE                   [address]            [place] [address]          [phone]   2 Days Post-Op Procedure(s) (LRB): MINIMALLY INVASIVE TRICUSPID VALVE REPAIR USING EDWARDS MC3 T28 ANNULOPLASTY RING, INSERTION TEMPORARY TRANSVENOUS AV PACING LEAD (Right) MINIMALLY INVASIVE MAZE PROCEDURE (N/A) TRANSESOPHAGEAL ECHOCARDIOGRAM (TEE) (N/A)  Total Length of Stay:  LOS: 2 days   Subjective:  Awake and alert, had some nausea with vomiting earlier but is feeling better now.  She said pain control is adequate with oral Tylenol.   Objective: Vital signs in last 24 hours: Temp:  [98.7 F (37.1 C)-99.9 F (37.7 C)] 99.5 F (37.5 C) (06/19 0700) Pulse Rate:  [72-112] 80 (06/19 0700) Cardiac Rhythm: A-V Sequential paced (06/19 0400) Resp:  [11-24] 18 (06/19 0700) BP: (92-123)/(58-82) 118/72 (06/19 0600) SpO2:  [86 %-100 %] 95 % (06/19 0700) Arterial Line BP: (89-114)/(48-60) 110/60 (06/18 0900) FiO2 (%):  [28 %] 28 % (06/18 2206) Weight:  [81.5 kg] 81.5 kg (06/19 0431)  Filed Weights   06/12/19 0709 06/13/19 0500 06/14/19 0431  Weight: 76.5 kg 85.1 kg 81.5 kg    Weight change: 5 kg   Hemodynamic parameters for last 24 hours: CVP:  [0 mmHg-21 mmHg] 16 mmHg  Intake/Output from previous day: 06/18 0701 - 06/19 0700 In: 1640 [P.O.:480; I.V.:867.3; IV Piggyback:292.7] Out: 2055 [Urine:1385; Chest Tube:670]  Intake/Output this shift: No intake/output data recorded.  Current Meds: Scheduled Meds: . acetaminophen  1,000 mg Oral Q6H  . aspirin EC  325 mg Oral Daily  . bisacodyl  10 mg Oral Daily   Or  . bisacodyl  10 mg Rectal Daily  . Chlorhexidine Gluconate Cloth  6 each Topical Daily  . docusate sodium  200 mg Oral Daily  . enoxaparin (LOVENOX) injection  30 mg Subcutaneous QHS  . furosemide  20 mg Intravenous BID  . insulin aspart  0-24 Units Subcutaneous Q4H  . mouth rinse  15 mL Mouth Rinse BID  . metoCLOPramide (REGLAN)  injection  10 mg Intravenous Q6H  . pantoprazole  40 mg Oral Daily  . sodium chloride flush  10-40 mL Intracatheter Q12H  . sodium chloride flush  3 mL Intravenous Q12H  . warfarin  2.5 mg Oral q1800  . Warfarin - Physician Dosing Inpatient   Does not apply q1800   Continuous Infusions: . sodium chloride    . cefUROXime (ZINACEF)  IV Stopped (06/13/19 2105)  . dexmedetomidine (PRECEDEX) IV infusion Stopped (06/12/19 2132)  . DOPamine Stopped (06/12/19 1747)  . lactated ringers 20 mL/hr at 06/14/19 0700  . lactated ringers 20 mL/hr at 06/14/19 0700   PRN Meds:.lip balm, metoprolol tartrate, morphine injection, ondansetron (ZOFRAN) IV, oxyCODONE, sodium chloride flush, sodium chloride flush, traMADol  General appearance: alert, cooperative and mild distress Neurologic: intact Heart: 100% paced overnight except for a brief episode when capture was lost. Pacer rate dialed down this AM revealing sinus bradycardia in mid 40's. Unable to A-pace even after change in polarity and change to new cable. Currently V-paced at 70 using the epicardial ventricular leads with consistent capture.  Lungs: Breath sounds clear.  Abdomen: soft, nontender, few bowel sounds.  Extremities: All well perfused, right groin vascular access sites clean and dry, no evidence of hematoma.  Wound: right chest incision covered with   Lab  Results: CBC: Recent Labs    06/13/19 1605 06/14/19 0353  WBC 20.5* 22.3*  HGB 10.6* 10.1*  HCT 31.5* 31.1*  PLT 90* 95*   BMET:  Recent Labs    06/13/19 1605 06/14/19 0353  NA 135 135  K 4.2 4.2  CL 104 102  CO2 23 24  GLUCOSE 143* 127*  BUN 16 22  CREATININE 1.05* 1.32*  CALCIUM 7.9* 8.3*    CMET: Lab Results  Component Value Date   WBC 22.3 (H) 06/14/2019   HGB 10.1 (L) 06/14/2019   HCT 31.1 (L) 06/14/2019   PLT 95 (L) 06/14/2019   GLUCOSE 127 (H) 06/14/2019   CHOL 191 09/18/2018   TRIG 106.0 09/18/2018   HDL 53.70 09/18/2018   LDLDIRECT 128.8 01/17/2011    LDLCALC 116 (H) 09/18/2018   ALT 25 06/10/2019   AST 42 (H) 06/10/2019   NA 135 06/14/2019   K 4.2 06/14/2019   CL 102 06/14/2019   CREATININE 1.32 (H) 06/14/2019   BUN 22 06/14/2019   CO2 24 06/14/2019   TSH 2.40 09/18/2018   INR 1.4 (H) 06/14/2019   HGBA1C 5.4 06/10/2019      PT/INR:  Recent Labs    06/14/19 0353  LABPROT 17.1*  INR 1.4*   Radiology: Dg Chest Port 1 View  Result Date: 06/14/2019 CLINICAL DATA:  Tricuspid valve repair.  Sore chest. EXAM: PORTABLE CHEST 1 VIEW COMPARISON:  06/13/2019. FINDINGS: Right chest tube in stable position. Mediastinal drainage tubing in stable position. Right IJ pacing line in stable position. Left IJ line in stable position. Again catheter is noted coursing from the right supraclavicular region and projected over the left mediastinum for which clinical correlation suggested, this is of uncertain etiology and intravascular location again cannot be confirmed. Prior cardiac valve replacement. Stable cardiomegaly. Improved aeration in the lung bases. Mild infiltrates/edema right mid lung again noted. No prominent pleural effusion. Degenerative. Changes scoliosis thoracic spine mild right chest wall subcutaneous emphysema. IMPRESSION: 1. Lines and tubes including right chest tube in unchanged. See complete discussion above. No pneumothorax. 2.  Prior cardiac valve replacement.  Stable cardiomegaly. 3. Improved aeration in the lung bases. Mild infiltrates/edema right mid lung again noted. No prominent pleural effusion. Electronically Signed   By: [place]   On: 06/14/2019 06:52     Assessment/Plan: S/P Procedure(s) (LRB): MINIMALLY INVASIVE TRICUSPID VALVE REPAIR USING EDWARDS MC3 T28 ANNULOPLASTY RING, INSERTION TEMPORARY TRANSVENOUS AV PACING LEAD (Right) MINIMALLY INVASIVE MAZE PROCEDURE (N/A) TRANSESOPHAGEAL ECHOCARDIOGRAM (TEE) (N/A)  -POD2 complex tricuspid valve repair and MAZE for rheumatic valve disease, severe TR, and chronic  afib . She has had some recovery of her conduction system, now with S. Bradycardia.  Will continue V-pacing for now and continue to observe. Appreciate evaluation by EP service.          INR 1.4.  Dose coumadin at 2mg  today             Leave CT's today  -Expected mild respiratory insufficiency- continue working on pulmonary hygiene and O2 wean.  -Volume excess-Not much response from Lasix yesterday although recorded weight is down over 3kg. Will switch to a lasix drip today.  -History of HTN--BP acceptable without her usual BP meds.  Continue to monitor, avoid beta blockers until rhythm issues resolved.  -DVT PPX- continue SCD's, add Lovenox tonight  Antony Odea, PA-C [phone] 06/14/2019 7:29 AM   I have seen and examined the patient and agree with the assessment and plan as  outlined.  Sinus bradycardia w/ HR 45-50 under pacer this morning.  Epicardial V-wires also now functioning.  I think it's reasonable to hold off on implantation of PPM for now and continue to monitor rhythm.  Rexene Alberts, MD 06/14/2019

## 2019-06-14 NOTE — Progress Notes (Signed)
Assisted pt & family with video chat via elink.

## 2019-06-14 NOTE — Progress Notes (Signed)
T[facility] BRIEF SICU PROGRESS NOTE  2 Days Post-Op  S/P Procedure(s) (LRB): MINIMALLY INVASIVE TRICUSPID VALVE REPAIR USING EDWARDS MC3 T28 ANNULOPLASTY RING, INSERTION TEMPORARY TRANSVENOUS AV PACING LEAD (Right) MINIMALLY INVASIVE MAZE PROCEDURE (N/A) TRANSESOPHAGEAL ECHOCARDIOGRAM (TEE) (N/A)   Temporary transvenous pacing wire removed  Plan: Mobilize out of bed  Rexene Alberts, MD 06/14/2019 2:02 PM

## 2019-06-14 NOTE — Discharge Summary (Signed)
Physician Discharge Summary  Patient ID: Adriana Hendricks MRN: [id number] DOB/AGE: 1935-09-13 84 y.o.  Admit date: 06/12/2019 Discharge date: 06/19/2019  Admission Diagnoses:  Tricuspid valve insufficiency Persistent atrial fibrillation Mitral valve insufficiency, mild Hypertension  Discharge Diagnoses:  Principal Problem:   S/P minimally invasive tricuspid valve repair + maze procedure Active Problems:   Essential hypertension   Chronic atrial fibrillation   DOE (dyspnea on exertion)   Persistent atrial fibrillation   Severe tricuspid regurgitation   Tricuspid valve insufficiency   Rheumatic heart disease   Sinus bradycardia   Discharged Condition: good   History of Present Illness:  Patient is an 84 year old female with history of hypertension and obstructive sleep apnea on home CPAP who has been referred for surgical consultation to discuss treatment options for management of persistent atrial fibrillation that has failed an attempt at DC cardioversion and severe tricuspid regurgitation.  Patient states that she was told she had a leaky mitral valve many years ago. She otherwise reports no significant cardiac history. She describes a several year history of gradual progression of symptoms of exertional shortness of breath. She was diagnosed with obstructive sleep apnea and has been followed for several years by Dr. Halford Hendricks.She was seen in follow-up last summer and noted to be in atrial fibrillation. She was referred for cardiac evaluation and has been followed carefully ever since by Dr. Ellyn Hendricks. Baseline transthoracic echocardiogram performed August 02, 2018 revealed normal left ventricular size and systolic function with ejection fraction estimated 60 to 65%. There was mild mitral regurgitation. There was severe left atrial enlargement. There was severe tricuspid regurgitation. Right ventricular size and function was felt to be normal. A nuclear stress test was performed  and felt to be low risk. Baseline ejection fraction was estimated 71%. There were no ST segment changes during stress.A 48-hour Holter monitor was performed revealing persistent atrial fibrillation. The patient had no evidence for any episodes of sinus rhythm. There were occasional PVCs. The patient was anticoagulated using warfarin. She underwent elective DC cardioversion December 02, 2018. She initially was converted into sinus rhythm but she promptly returned to atrial fibrillation. She was seen in follow-up in the atrial fibrillation clinic and felt to be relatively poor candidate for catheter-based ablation due to her age, the severity of left atrial enlargement, and the presence of severe tricuspid regurgitation. She was seen in follow-up recently by Dr. Ellyn Hendricks and has now been referred for elective surgical consultation.  Patient is married and lives locally in [place] with her husband. 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 She has never exercised on a regular basis. She describes a long history of exertional shortness of breath that probably began at least 8 or 10 years ago. Over the last 6 months or so symptoms have progressed. She now gets short of breath quite easily, such as just walking 6 or 8 steps at a brisk pace. She denies any resting shortness of breath. She denies any history of PND, orthopnea, or lower extremity edema. She has occasional palpitations and dizzy spells. She has never had any syncopal episodes. She denies any history of chest pain or chest tightness either with activity or at rest.   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 She  was originally seen in  consultation on January 07, 2019. She was last seen here in our office on March 04, 2019 at which time we made tentative plans for elective tricuspid valve repair, Maze procedure, and possible mitral valve repair. Plans for surgery were postponed because of the COVID-19 pandemic. I last spoke with the patient over the telephone on May 01, 2019 who reported at that time that she remained reasonably stable although she had continued to experience slow gradual progression of symptoms of exertional shortness of breath and fatigue. She returns to our office  with hopes to proceed with elective surgery as previously planned. She denies resting shortness of breath or chest pain. Weight is been stable. She has not developed worsening lower extremity edema or abdominal bloating. Appetite is stable. She specifically denies any productive cough, fever, or other symptoms to suggest ongoing viral illness. She has been very careful about social distancing and she has not traveled at all for the past few months. She denies any known exposure to persons with known or suspected COVID-19 infection.  Hospital Course:  Adriana. Hendricks was admitted for same same-day surgery on 06/12/19 and taken to the OR where the procedures described below were carried out without complication. Following surgery, she was transported to the CVICU intubated and hemodynamically stable. She was weaned from the ventilator and safely extubated during the evening hours after surgery. She was initially pacer-dependent but had partial recovery of her conduction system with sinus bradycardia by post-op day 2. Anticoagulation was initiated on post-op day 2.  Her cardiac rhythm went on to recover to stable SR.  She regained independence with her mobility. Diet was advanced and well tolerated. She had return of appropriate bowel and bladder function. Pacer wires were removed without complication. Her incisions and vascular access sites  were bruised but showing evidence of healing without complication at the time or discharge.   Consults: Electrophysiology  Significant Diagnostic Studies:   Transthoracic Echocardiography  Patient: Philomina, Leon MR #: [id number] Study Date: 08/02/2018 Gender: F Age: 66 Height: 165.1 cm Weight: 76.2 kg BSA: 1.89 m^2 Pt. Status: Room:  SONOGRAPHER Wyatt Mage, [place] ATTENDING Glenetta Hew, MD ORDERING Glenetta Hew, MD [place], MD PERFORMING [facility], Outpatient  cc:  ------------------------------------------------------------------- LV EF: 60% - 65%  ------------------------------------------------------------------- Indications: Dyspnea on exertion (R06.09).  ------------------------------------------------------------------- History: PMH: Dyspnea. Atrial fibrillation. Risk factors: OSA. Former tobacco use. Hypertension. Dyslipidemia.  ------------------------------------------------------------------- Study Conclusions  - Left ventricle: The cavity size was normal. Systolic function was normal. The estimated ejection fraction was in the range of 60% to 65%. Wall motion was normal; there were no regional wall motion abnormalities. - Aortic valve: Transvalvular velocity was within the normal range. There was no stenosis. There was no regurgitation. - Mitral valve: Transvalvular velocity was within the normal range. There was no evidence for stenosis. There was mild regurgitation. - Left atrium: The atrium was severely dilated. - Right ventricle: The cavity size was normal. Wall thickness was normal. Systolic function was normal. - Right atrium: The atrium was mildly dilated. - Atrial septum: No defect or patent foramen ovale was identified by color flow Doppler. - Tricuspid valve: There was severe regurgitation. - Pulmonary arteries: Systolic pressure was within  the normal range. PA peak pressure: 30 mm Hg (S).  ------------------------------------------------------------------- Study data: No prior study was available for comparison. Study status: Routine. Procedure: The patient reported no pain pre or post test. Transthoracic echocardiography. Image quality was adequate. Study completion: There were no complications. Transthoracic echocardiography. M-mode, complete  2D, spectral Doppler, and color Doppler. Birthdate: Patient birthdate: 01-15-1935. Age: Patient is 84 yr old. Sex: Gender: female. BMI: 28 kg/m^2. Blood pressure: 138/79 Patient status: Outpatient. Study date: Study date: 08/02/2018. Study time: 10:03 AM. Location: [facility]  -------------------------------------------------------------------  ------------------------------------------------------------------- Left ventricle: The cavity size was normal. Systolic function was normal. The estimated ejection fraction was in the range of 60% to 65%. Wall motion was normal; there were no regional wall motion abnormalities.  ------------------------------------------------------------------- Aortic valve: Trileaflet; mildly thickened, mildly calcified leaflets. Mobility was not restricted. Doppler: Transvalvular velocity was within the normal range. There was no stenosis. There was no regurgitation.  ------------------------------------------------------------------- Aorta: Aortic root: The aortic root was normal in size.  ------------------------------------------------------------------- Mitral valve: Structurally normal valve. Mobility was not restricted. Doppler: Transvalvular velocity was within the normal range. There was no evidence for stenosis. There was mild regurgitation.  ------------------------------------------------------------------- Left atrium: The atrium was severely  dilated.  ------------------------------------------------------------------- Atrial septum: No defect or patent foramen ovale was identified by color flow Doppler.  ------------------------------------------------------------------- Right ventricle: The cavity size was normal. Wall thickness was normal. Systolic function was normal.  ------------------------------------------------------------------- Pulmonic valve: Structurally normal valve. Cusp separation was normal. Doppler: Transvalvular velocity was within the normal range. There was no evidence for stenosis. There was no regurgitation.  ------------------------------------------------------------------- Tricuspid valve: Structurally normal valve. Doppler: Transvalvular velocity was within the normal range. There was severe regurgitation.  ------------------------------------------------------------------- Pulmonary artery: The main pulmonary artery was normal-sized. Systolic pressure was within the normal range.  ------------------------------------------------------------------- Right atrium: The atrium was mildly dilated.  ------------------------------------------------------------------- Pericardium: There was no pericardial effusion.  ------------------------------------------------------------------- Systemic veins: Inferior vena cava: The vessel was normal in size. The respirophasic diameter changes were in the normal range (>= 50%), consistent with normal central venous pressure.  ------------------------------------------------------------------- Measurements  Left ventricle Value Reference LV ID, ED, PLAX chordal 44 mm 43 - 52 LV ID, ES, PLAX chordal 28 mm 23 - 38 LV fx shortening, PLAX chordal 36 % >=29 LV PW thickness, ED 11 mm --------- IVS/LV PW ratio,  ED 0.91 <=1.3 Stroke volume, 2D 93 ml --------- Stroke volume/bsa, 2D 49 ml/m^2 ---------  Ventricular septum Value Reference IVS thickness, ED 10 mm ---------  LVOT Value Reference LVOT ID, S 22 mm --------- LVOT area 3.8 cm^2 --------- LVOT peak velocity, S 111 cm/s --------- LVOT mean velocity, S 77.6 cm/s --------- LVOT VTI, S 24.5 cm ---------  Aorta Value Reference Aortic root ID, ED 38 mm ---------  Left atrium Value Reference LA ID, A-P, ES 45 mm --------- LA ID/bsa, A-P (H) 2.38 cm/m^2 <=2.2 LA volume, S 82.7 ml --------- LA volume/bsa, S 43.8 ml/m^2 --------- LA volume, ES, 1-p A4C 75.8 ml --------- LA volume/bsa, ES, 1-p A4C 40.1 ml/m^2 --------- LA volume, ES, 1-p A2C 87.2 ml --------- LA volume/bsa, ES, 1-p A2C 46.2 ml/m^2 ---------  Pulmonary arteries Value Reference PA pressure, S, DP 30 mm Hg <=30  Tricuspid valve Value Reference Tricuspid regurg peak velocity 258 cm/s --------- Tricuspid peak RV-RA gradient 27 mm Hg ---------  Systemic veins Value Reference Estimated CVP 3 mm Hg ---------  Right ventricle Value Reference TAPSE  19.5 mm --------- RV pressure, S, DP 30 mm Hg <=30 RV s&', lateral, S 10.7 cm/s ---------  Legend: (L) and (H) mark values outside specified reference range.  ------------------------------------------------------------------- Prepared and Electronically Authenticated by  Skeet Latch, MD 2019-08-08T14:51:21    NUCLEAR STRESS TEST   Nuclear stress EF: 71%.  The left ventricular ejection fraction is hyperdynamic (>  65%).  There was no ST segment deviation noted during stress.  The study is normal.  This is a low risk study.  Normal stress nuclear study with no ischemia or infarction; EF 71 with normal wall motion.       Transesophageal Echocardiography  Patient: Ludwika, Rodd MR #: [id number] Study Date: 01/16/2019 Gender: F Age: 72 Height: 166.4 cm Weight: 77.1 kg BSA: 1.91 m^2 Pt. Status: Room:  PERFORMING Fransico Him, MD Criss Rosales ADMITTING Glenetta Hew, MD ATTENDING Glenetta Hew, MD SONOGRAPHER Johny Chess, RDCS, CCT  cc:  ------------------------------------------------------------------- LV EF: 55% - 60%  ------------------------------------------------------------------- Indications: Tricuspid insufficiency 424.2. Severe valvular disease  ------------------------------------------------------------------- Study Conclusions  - Left ventricle: Systolic function was normal. The estimated ejection fraction was in the range of 55% to 60%. Wall motion was normal; there were no regional wall motion abnormalities. - Aortic valve: Bicuspid; mildly thickened leaflets. There was trivial regurgitation. - Mitral valve: Mobility of the posterior leaflet was mildly restricted. There was mild to moderate regurgitation directed centrally. - Left  atrium: The atrium was moderately to severely dilated. No evidence of thrombus in the atrial cavity or appendage. There was spontaneous echo contrast (&quot;smoke&quot;). - Right atrium: The atrium was severely dilated. No evidence of thrombus in the atrial cavity or appendage. - Atrial septum: There was increased thickness of the septum, consistent with lipomatous hypertrophy. - Tricuspid valve: Poor coaptation of the TV leaflets due to annular dilatation. There is severe TR.  ------------------------------------------------------------------- Study data: Study status: Routine. Consent: The risks, benefits, and alternatives to the procedure were explained to the patient and informed consent was obtained. Procedure: Initial setup. The patient was brought to the laboratory. Surface ECG leads were monitored. Sedation. Conscious sedation was administered by cardiology staff. Transesophageal echocardiography. A 3rd probe transesophageal probe was inserted by the attending cardiologistwithout difficulty. Image quality was adequate. Study completion: The patient tolerated the procedure well. There were no complications. Administered medications: Midazolam, 71m, IV. Fentanyl, 37.563m, IV. Diagnostic transesophageal echocardiography. 2D and color Doppler. Birthdate: Patient birthdate: [id number]: Patient is [age]r old. Sex: Gender: female. BMI: 27.8 kg/m^2. Blood pressure: 143/94 Patient status: Outpatient. Study date: Study date: 01/16/2019. Study time: 07:50 AM. Location: Endoscopy.  -------------------------------------------------------------------  ------------------------------------------------------------------- Left ventricle: Systolic function was normal. The estimated ejection fraction was in the range of 55% to 60%. Wall motion was normal; there were no regional wall motion  abnormalities.  ------------------------------------------------------------------- Aortic valve: Bicuspid; mildly thickened leaflets. Cusp separation was normal. Doppler: There was trivial regurgitation.  ------------------------------------------------------------------- Aorta: There was no atheroma. There was no evidence for dissection. Aortic root: The aortic root was not dilated. Ascending aorta: The ascending aorta was normal in size. Aortic arch: The aortic arch was normal in size. Descending aorta: The descending aorta was normal in size.  ------------------------------------------------------------------- Mitral valve: Mildly thickened leaflets . Leaflet separation was normal. Mobility of the posterior leaflet was mildly restricted. Doppler: There was mild to moderate regurgitation directed centrally.  ------------------------------------------------------------------- Left atrium: The atrium was moderately to severely dilated. No evidence of thrombus in the atrial cavity or appendage. There was spontaneous echo contrast (&quot;smoke&quot;). The appendage was morphologically a left appendage, multilobulated, and of normal size.  ------------------------------------------------------------------- Atrial septum: There was increased thickness of the septum, consistent with lipomatous hypertrophy.  ------------------------------------------------------------------- Right ventricle: The cavity size was normal. Wall thickness was normal. Systolic function was normal.  ------------------------------------------------------------------- Pulmonic valve: Structurally normal valve.  ------------------------------------------------------------------- Tricuspid valve: Poor coaptation of the TV leaflets due to  annular dilatation. There is severe TR.  ------------------------------------------------------------------- Pulmonary artery: The main pulmonary  artery was normal-sized.  ------------------------------------------------------------------- Right atrium: The atrium was severely dilated. No evidence of thrombus in the atrial cavity or appendage. The appendage was morphologically a right appendage.  ------------------------------------------------------------------- Pericardium: There was no pericardial effusion.  ------------------------------------------------------------------- Measurements  Mitral valve Value Mitral maximal regurg velocity, PISA 538 cm/s Mitral regurg VTI, PISA 178 cm Mitral ERO, PISA 0.04 cm^2 Mitral regurg volume, PISA 7 ml  Legend: (L) and (H) mark values outside specified reference range.  ------------------------------------------------------------------- Prepared and Electronically Authenticated by  Fransico Him, MD 2020-01-23T14:27:30    RIGHT/LEFT HEART CATH AND CORONARY ANGIOGRAPHY  Conclusion     Angiographically normal coronary arteries  LV end diastolic pressure is normal.  No evidence pulmonary pretension  SUMMARY  Angiographically minimal CAD.  Strikingly normal RIGHT HEART CATH PRESSURES with only high normal mean PA pressure of 23 mmHg in the setting of PCWP pressure of 18 mmHg.  Basically euvolemic with LVEDP of 7 mmHg. -With mean PA pressure of 23 mmHg there is a transpulmonary gradient suggesting a primary pulmonary etiology.   RECOMMENDATIONS  We will defer to Dr. Roxy Manns reference potential management of tricuspid valve insufficiency based on TEE with relative normal right heart cath numbers.  Would recommend treating potential pulmonary etiology.  Otherwise would continue current cardiac medications    Follow-up as scheduled.  Glenetta Hew, MD    Recommendations   Antiplatelet/Anticoag No indication for antiplatelet therapy  at this time . Resume Warfarin tonight.  Surgeon Notes     01/16/2019 8:41 AM CV Procedure signed by Sueanne Margarita, MD  Indications   Nonrheumatic tricuspid valve regurgitation [I36.1 (ICD-10-CM)]  DOE (dyspnea on exertion) [R06.09 (ICD-10-CM)]  Procedural Details   Technical Details PCP: Marin Olp, MD  Cardiologist: Dr. Ellyn Hendricks, Beckie Busing, NP;  EP: [name], NP in the Afib clinic.  CT surgeon: Dr. Roxy Manns  Mrs. Antonetti is a very pleasant 84 year old woman with chronic persistent atrial fibrillation (unsuccessful rhythm control/cardioversion) who has been having significant exertional dyspnea. Echocardiographically she was found to have severe tricuspid regurgitation, but no other signs and symptoms of cor pulmonale. She was referred to Dr. Roxy Manns to consider Maze procedure plus tricuspid repair. Dr. Roxy Manns was not convinced that this would be appropriate therapy without first evaluating for potential ischemic etiology and/or pulmonary pretension. She was therefore Scheduled for Right and Left Heart Cath and TEE today after being seen by Jory Sims, NP.  She Has Had Her TEE, and Is Now Presenting for Her Right and Left Heart Cath. She has not had any chest pain or pressure. She remains in A. fib. Warfarin has been on hold for the procedure.  Time Out: Verified patient identification, verified procedure, site/side was marked, verified correct patient position, special equipment/implants available, medications/allergies/relevent history reviewed, required imaging and test results available. Performed.   Access:  Right radial Artery: 6 Fr sheath -- Seldinger technique using Angiocath Micropuncture Kit * Direct ultrasound guidance used. Permanent image obtained and placed on chart.  *10 mL radial cocktail IA; 4000 Units IV Heparin  * Right Brachial/Antecubital Vein: The existing 18-gauge IV was exchanged over a wire for a 5Fr sheath  Right Heart Catheterization: 5 Fr  Gordy Councilman catheter advanced under fluoroscopy with balloon inflated to the RA, RV, then PCWP-PA for hemodynamic measurement.  * Simultaneous FA &PA blood gases checked for SaO2% to calculate FICK CO/CI  * The Catheter removed completely out of the  body with balloon deflated.  Left Heart Catheterization: 5Fr Catheters were advanced or exchanged over a J-wire under direct fluoroscopic guidance into the ascending aorta; TIG 4.0 catheter advanced first.  * LV Hemodynamics (LV Gram): Angled Pigtail Catheter * Left &Right Coronary Artery Cineangiography: TIG 4.0 Catheter   Upon completion of Angiogaphy, the catheter was removed completely out of the body over a wire, without complication.  Brachial Sheath(s) removed in the post procedure unit with manual pressure for hemostasis.   Radial sheath removed in the Cardiac Catheterization lab with TR Band placed for hemostasis.  TR Band: 1110 Hours; 11 mL air  MEDICATIONS * SQ Lidocaine 71m * Radial Cocktail: 3 mg Verapmil in 10 mL NS * Isovue Contrast: 30 * Heparin: 4000 Units  Fluoro time: 5.8 minutes. Dose Area Product: 5.6 mGycm2. Cumulative Air Kerma: 104.6 mGy. Physician was notified of the radiation values and acknowledged them during the procedure.  Estimated blood loss <50 mL.   During this procedure medications were administered to achieve and maintain moderate conscious sedation while the patient's heart rate, blood pressure, and oxygen saturation were continuously monitored and I was present face-to-face 100% of this time.  Medications  (Filter: Administrations occurring from 01/16/19 1013 to 01/16/19 1123)          Medication Rate/Dose/Volume Action  Date Time   midazolam (VERSED) injection (mg) 1 mg Given 01/16/19 1033   Total dose as of 01/28/19 1048        1 mg        Heparin (Porcine) in NaCl 1000-0.9 UT/500ML-% SOLN (mL) 500 mL Given 01/16/19 1033   Total dose as of 01/28/19 1048 500 mL Given 1034    1,000 mL        lidocaine (PF) (XYLOCAINE) 1 % injection (mL) 2 mL Given 01/16/19 1037   Total dose as of 01/28/19 1048 2 mL Given 1038   4 mL        Radial Cocktail/Verapamil only (mL) 10 mL Given 01/16/19 1039   Total dose as of 01/28/19 1048        10 mL        heparin injection (Units) 3,000 Units Given 01/16/19 1057   Total dose as of 01/28/19 1048        3,000 Units        iohexol (OMNIPAQUE) 350 MG/ML injection (mL) 30 mL Given 01/16/19 1114   Total dose as of 01/28/19 1048        30 mL        Sedation Time   Sedation Time Physician-1: 32 minutes 31 seconds  Complications      Complications documented before study signed (01/16/2019 2:03 PM EST)     No complications were associated with this study.  Documented by HLeonie Man MD - 01/16/2019 11:23 AM EST    Coronary Findings   Diagnostic  Dominance: Right  Left Main  Vessel was injected. Vessel is large. Vessel is angiographically normal.  Left Anterior Descending  Vessel was injected. Vessel is normal in caliber and large. Vessel is angiographically normal.  First Diagonal Branch  Vessel is moderate in size.  First Septal Branch  Vessel is small in size.  Second Diagonal Branch  Vessel is small in size.  Second Septal Branch  Vessel is small in size.  Third Septal Branch  Vessel is small in size.  Ramus Intermedius  Vessel was injected. Vessel is moderate in size. Vessel is angiographically normal.  Left Circumflex  Vessel  was injected. Vessel is normal in caliber and large. Vessel is angiographically normal.  First Obtuse Marginal Branch  Vessel is large in size.  Left Atrioventricular Groove Continuation  Vessel is small in size.  Right Coronary Artery  Vessel was injected. Vessel is normal in caliber and large. Vessel is angiographically normal.  Right Posterior Descending Artery  Vessel is moderate in size.  Right Posterior  Atrioventricular Branch  Vessel is moderate in size.  First Right Posterolateral  Vessel is small in size.  Second Right Posterolateral  Vessel is small in size.  Intervention   No interventions have been documented.  Right Heart   Right Heart Pressures PA P 34/14 mmHg. Mean PA P 23 mmHg PCWP 18 mmHg LV EDP is normal. LVP-EDP: 112/1 mmHg-6 mmHg Aortic pressure 119/65 mmHg. MAP 86 mmHg  SaO2 95%. PaO2 69%. Cardiac Output-Index by FICK: 5.02-2.71  Right Atrium The right atrium is severely dilated. RA pressure 7 mmHg. Peak pressure of 13 mmHg. (Upper normal)  Right Ventricle RVP-EDP: 31/1 mmHg - 5 mmHg  Wall Motion   Resting    No LV Gram        Left Heart   Left Ventricle LV end diastolic pressure is normal.  Coronary Diagrams   Diagnostic  Dominance: Right     Treatments:   CARDIOTHORACIC SURGERY OPERATIVE NOTE  Date of Procedure:                            06/12/2019  Preoperative Diagnosis:         Severe Tricuspid Regurgitation  Recurrent Persistent Atrial Fibrillation  Postoperative Diagnosis:    Same  Procedure:        Minimally-Invasive Tricuspid Valve Repair             Complex valvuloplasty including autologous pericardial patch augmentation of anterior and posterior leaflets             Edwards mc3 Ring Annuloplasty (size 28 mm, model #4900, serial #[id number])   Minimally-Invasive Maze Procedure             Complete bilateral atrial lesion set using cryothermy and bipolar radiofrequency ablation             Clipping of Left Atrial Appendage (Atricure Pro245 left atrial clip, size 45 mm)   Insertion of Temporary Transvenous Pacemaker Lead              Ultrasound-guided cannulation of right internal jugular vein             Fluoroscopic-guided insertion of transvenous pacemaker lead   Surgeon:        Valentina Gu. Roxy Manns, MD  Assistant:       Enid Cutter, PA-C  Anesthesia:    Albertha Ghee, MD  Operative  Findings: ? Rheumatic heart disease ? Type IIIA tricuspid valve dysfunction with severe tricuspid regurgitation ? Type IIIA mitral valve dysfunction with mild mitral regurgitation ? Seivers type 0 bicuspid aortic valve with normal valve function, no aortic stenosis nor aortic insufficiency ? Normal left ventricular systolic function ? Dilated right ventricle with normal systolic function ? Severe left and right atrial enlargement                     BRIEF CLINICAL NOTE AND INDICATIONS FOR SURGERY  Patient is an 84 year old female with history of hypertension and obstructive sleep apnea on home CPAP who has been referred for surgical consultation to discuss  treatment options for management of persistent atrial fibrillation that has failed an attempt at DC cardioversion and severe tricuspid regurgitation.  Patient states that she was told she had a leaky mitral valve many years ago. She otherwise reports no significant cardiac history. She describes a several year history of gradual progression of symptoms of exertional shortness of breath. She was diagnosed with obstructive sleep apnea and has been followed for several years by Dr. Halford Hendricks.She was seen in follow-up last summer and noted to be in atrial fibrillation. She was referred for cardiac evaluation and has been followed carefully ever since by Dr. Ellyn Hendricks. Baseline transthoracic echocardiogram performed August 02, 2018 revealed normal left ventricular size and systolic function with ejection fraction estimated 60 to 65%. There was mild mitral regurgitation. There was severe left atrial enlargement. There was severe tricuspid regurgitation. Right ventricular size and function was felt to be normal. A nuclear stress test was performed and felt to be low risk. Baseline ejection fraction was estimated 71%. There were no ST segment changes during stress.A 48-hour Holter monitor was performed revealing  persistent atrial fibrillation. The patient had no evidence for any episodes of sinus rhythm. There were occasional PVCs. The patient was anticoagulated using warfarin. She underwent elective DC cardioversion December 02, 2018. She initially was converted into sinus rhythm but she promptly returned to atrial fibrillation. She was seen in follow-up in the atrial fibrillation clinic and felt to be relatively poor candidate for catheter-based ablation due to her age, the severity of left atrial enlargement, and the presence of severe tricuspid regurgitation. She was seen in follow-up by Dr. Ellyn Hendricks and has now been referred for elective surgical consultation.  She was originally seen in consultation on January 07, 2019. She was last seen here in our office on March 04, 2019 at which time we made tentative plans for elective tricuspid valve repair, Maze procedure, and possible mitral valve repair. Plans for surgery were postponed because of the COVID-19 pandemic. I last spoke with the patient over the telephone on May 01, 2019 who reported at that time that she remained reasonably stable although she had continued to experience slow gradual progression of symptoms of exertional shortness of breath and fatigue. She returns to our office today with hopes to proceed with elective surgery as previously planned. She denies resting shortness of breath or chest pain. Weight is been stable. She has not developed worsening lower extremity edema or abdominal bloating. Appetite is stable. She specifically denies any productive cough, fever, or other symptoms to suggest ongoing viral illness. She has been very careful about social distancing and she has not traveled at all for the past few months. She denies any known exposure to persons with known or suspected COVID-19 infection.  The patient has been seen in consultation and counseled at length regarding the indications, risks and potential benefits of surgery.   All questions have been answered, and the patient provides full informed consent for the operation as described.   Discharge Exam: Blood pressure 113/67, pulse 70, temperature 98 F (36.7 C), temperature source Oral, resp. rate 14, height _0  (1.651 m), weight 76.5 kg, SpO2 96 %.  General appearance: alert, cooperative and no distress Neurologic: intact Heart: regular rate and rhythm. Monitor shows consistent SR for >48 hours.  Lungs: Breath sounds are clear. Acceptable O2 sats on RA. Extremities: No edema, all well perfused. Wound: Right chest incision bruised but well approximated and healing. Right groin vascular access site bruised but no hematoma.  Disposition:    Allergies as of 06/19/2019      Reactions   Levofloxacin Other (See Comments)   Developed tendon pain. (Has a history of a Achilles tendon tear)   Potassium Chloride Hives   Clarithromycin Nausea Only   Erythromycin Base Nausea And Vomiting   Metronidazole Rash   Penicillins Rash   Did it involve swelling of the face/tongue/throat, SOB, or low BP? No Did it involve sudden or severe rash/hives, skin peeling, or any reaction on the inside of your mouth or nose? No Did you need to seek medical attention at a hospital or doctor's office? No When did it last happen? as a child If all above answers are "NO", may proceed with cephalosporin use.      Medication List    STOP taking these medications   chlorthalidone 25 MG tablet Commonly known as: HYGROTON   nadolol 40 MG tablet Commonly known as: CORGARD     TAKE these medications   aspirin 81 MG EC tablet Take 1 tablet (81 mg total) by mouth daily.   B COMPLEX 100 PO Take 1 tablet by mouth daily.   calcium carbonate 500 MG chewable tablet Commonly known as: TUMS - dosed in mg elemental calcium Chew 1 tablet by mouth daily.   cholecalciferol 25 MCG (1000 UT) tablet Commonly known as: VITAMIN D3 Take 1,000 Units by mouth daily.   famotidine 20 MG  tablet Commonly known as: PEPCID Take 20 mg by mouth at bedtime.   furosemide 40 MG tablet Commonly known as: LASIX Take 1 tablet (40 mg total) by mouth daily for 30 days.   hydroxypropyl methylcellulose / hypromellose 2.5 % ophthalmic solution Commonly known as: ISOPTO TEARS / GONIOVISC Place 1 drop into both eyes 4 (four) times daily as needed for dry eyes.   oxyCODONE-acetaminophen 5-325 MG tablet Commonly known as: Percocet Take 1 tablet by mouth every 4 (four) hours as needed for up to 7 days for severe pain.   potassium chloride SA 20 MEQ tablet Commonly known as: K-DUR Take 1 tablet (20 mEq total) by mouth daily. Do not start until Friday 06/21/2019. Start taking on: June 21, 2019   PRESERVISION AREDS 2 PO Take 2 tablets by mouth 2 (two) times a day. What changed: Another medication with the same name was removed. Continue taking this medication, and follow the directions you see here.   warfarin 2.5 MG tablet Commonly known as: Coumadin Take as directed. If you are unsure how to take this medication, talk to your nurse or doctor. Original instructions: Take 2 tablets (5 mg total) by mouth daily at 6 PM for 30 days. Take 2 tablets daily or as instructed by [facility] Clinic. What changed:   medication strength  when to take this  additional instructions            Durable Medical Equipment  (From admission, onward)         Start     Ordered   06/19/19 0748  For home use only DME Walker rolling  Once    Question:  Patient needs a walker to treat with the following condition  Answer:  Muscular deconditioning   06/19/19 Celina         Follow-up Information    Lendon Colonel, NP Follow up on 06/26/2019.   Specialties: Nurse Practitioner, Radiology, Cardiology Why: Follow-up appointment will be on 06/26/2019 at 10:45 AM.  Contact information: [address] [place] [address] [phone]  [facility] Follow up on  06/20/2019.   Specialty: Cardiology Why: You have a follow-up appointment with Coumadin clinic on 06/20/2019 at 10:15 AM Contact information: [address] [place]. Go on 07/08/2019.   Specialty: Cardiothoracic Surgery Why: You have an appointment at Dr. Guy Sandifer office on Monday 07/08/19 at 3pm.  Please arrive 30 minutes early for a chest x-ray to be done by [facility] located on the first floor of the same building. Contact information: [place], [place] [phone]         The patient has been discharged on:   1.Beta Blocker:  Yes [   ]                              No   [  x ]                              If No, reason: Bradycardia  2.Ace Inhibitor/ARB: Yes [   ]                                     No  [  x  ]                                     If No, reason: not indicated  3.Statin:   Yes [   ]                  No  [  x ]                  If No, reason: No CAD  4.Shela CommonsVelta Addison  [ x  ]                  No   [   ]                  If No, reason:   Signed: Antony Odea, PA-C 06/19/2019, 8:05 AM

## 2019-06-14 NOTE — Progress Notes (Addendum)
Electrophysiology Rounding Note  Patient Name: Adriana Hendricks Date of Encounter: 06/14/2019  Primary Cardiologist: [name] Electrophysiologist: Curt Bears (new this admission)   Subjective   The patient is doing ok today.  She is still nauseated today. Temp wire with positional loss of capture last night.   Inpatient Medications    Scheduled Meds:  acetaminophen  1,000 mg Oral Q6H   aspirin EC  325 mg Oral Daily   bisacodyl  10 mg Oral Daily   Or   bisacodyl  10 mg Rectal Daily   Chlorhexidine Gluconate Cloth  6 each Topical Daily   docusate sodium  200 mg Oral Daily   enoxaparin (LOVENOX) injection  30 mg Subcutaneous QHS   furosemide  20 mg Intravenous BID   insulin aspart  0-24 Units Subcutaneous Q4H   mouth rinse  15 mL Mouth Rinse BID   metoCLOPramide (REGLAN) injection  10 mg Intravenous Q6H   pantoprazole  40 mg Oral Daily   sodium chloride flush  10-40 mL Intracatheter Q12H   sodium chloride flush  3 mL Intravenous Q12H   warfarin  2.5 mg Oral q1800   Warfarin - Physician Dosing Inpatient   Does not apply q1800   Continuous Infusions:  sodium chloride     cefUROXime (ZINACEF)  IV Stopped (06/13/19 2105)   dexmedetomidine (PRECEDEX) IV infusion Stopped (06/12/19 2132)   DOPamine Stopped (06/12/19 1747)   lactated ringers 20 mL/hr at 06/14/19 0700   lactated ringers 20 mL/hr at 06/14/19 0700   PRN Meds: lip balm, metoprolol tartrate, morphine injection, ondansetron (ZOFRAN) IV, oxyCODONE, sodium chloride flush, sodium chloride flush, traMADol   Vital Signs    Vitals:   06/14/19 0431 06/14/19 0500 06/14/19 0600 06/14/19 0700  BP:  108/73 118/72   Pulse:  80 79 80  Resp:  11 (!) 22 18  Temp:  99.9 F (37.7 C) 99.7 F (37.6 C) 99.5 F (37.5 C)  TempSrc:      SpO2:  95% 98% 95%  Weight: 81.5 kg     Height:        Intake/Output Summary (Last 24 hours) at 06/14/2019 0705 Last data filed at 06/14/2019 0700 Gross per 24 hour  Intake  1640.03 ml  Output 2055 ml  Net -414.97 ml   Filed Weights   06/12/19 0709 06/13/19 0500 06/14/19 0431  Weight: 76.5 kg 85.1 kg 81.5 kg    Physical Exam    GEN- The patient is elderly appearing, alert and oriented x 3 today.   Head- normocephalic, atraumatic Eyes-  Sclera clear, conjunctiva pink Ears- hearing intact Oropharynx- clear Neck- supple, +LIJ, +R temp pacing wire  Lungs- Clear to ausculation bilaterally, normal work of breathing, +chest tubes Heart- Regular rate and rhythm (paced) GI- soft, NT, ND, + BS Extremities- no clubbing, cyanosis  Skin- no rash or lesion Psych- euthymic mood, full affect Neuro- strength and sensation are intact  Labs    CBC Recent Labs    06/13/19 1605 06/14/19 0353  WBC 20.5* 22.3*  HGB 10.6* 10.1*  HCT 31.5* 31.1*  MCV 98.1 99.4  PLT 90* 95*   Basic Metabolic Panel Recent Labs    06/12/19 2307  06/13/19 0400 06/13/19 1605 06/14/19 0353  NA 135   < > 136 135 135  K 4.4   < > 3.9 4.2 4.2  CL 104  --  109 104 102  CO2  --    < > 21* 23 24  GLUCOSE 166*  --  119* 143* 127*  BUN 13  --  12 16 22   CREATININE 0.80   0.85  --  0.80 1.05* 1.32*  CALCIUM  --    < > 7.6* 7.9* 8.3*  MG 3.7*  --  3.1* 2.8*  --   PHOS 3.0  --   --   --   --    < > = values in this interval not displayed.     Telemetry    AV pacing, rare loss of capture  (personally reviewed)  Radiology    Dg Chest Port 1 View  Result Date: 06/14/2019 CLINICAL DATA:  Tricuspid valve repair.  Sore chest. EXAM: PORTABLE CHEST 1 VIEW COMPARISON:  06/13/2019. FINDINGS: Right chest tube in stable position. Mediastinal drainage tubing in stable position. Right IJ pacing line in stable position. Left IJ line in stable position. Again catheter is noted coursing from the right supraclavicular region and projected over the left mediastinum for which clinical correlation suggested, this is of uncertain etiology and intravascular location again cannot be confirmed. Prior  cardiac valve replacement. Stable cardiomegaly. Improved aeration in the lung bases. Mild infiltrates/edema right mid lung again noted. No prominent pleural effusion. Degenerative. Changes scoliosis thoracic spine mild right chest wall subcutaneous emphysema. IMPRESSION: 1. Lines and tubes including right chest tube in unchanged. See complete discussion above. No pneumothorax. 2.  Prior cardiac valve replacement.  Stable cardiomegaly. 3. Improved aeration in the lung bases. Mild infiltrates/edema right mid lung again noted. No prominent pleural effusion. Electronically Signed   By: Marcello Moores  Register   On: 06/14/2019 06:52    Patient Profile     Adriana Hendricks is a 84 y.o. female admitted for TVR and MAZE with bradycardia post op  Assessment & Plan    1.  Sinus bradycardia S/p TVR and MAZE Dr Roxy Manns turned down temp wire this morning with sinus brady underneath Would prefer to have chest tube and Left IJ removed prior to pacemaker if it becomes necessary. She is currently off pressor support. Will follow for now, no plans for PPM implant today   2.  Persistent AF S/p MAZE Continue Warfarin for CHADS2VASC of 4  3.  Severe TR S/p TVR this admission Per CT surgery   Dr Rayann Heman to see later today    For questions or updates, please contact [facility] Please consult [email or web address] for contact info under Cardiology/STEMI.  Signed, Chanetta Marshall, NP  06/14/2019, 7:05 AM    I have seen, examined the patient, and reviewed the above assessment and plan.  Changes to above are made where necessary.  On exam, RRR.  I have spoken with Dr Roxy Manns.  He feels that her sinus node function will return.  We will follow over the weekend with hopes to avoid pacing.  The patient is clear that she does not want pacing if this can be avoided.  Dr Lovena Le available over the weekend.  Co Sign: Thompson Grayer, MD 06/14/2019 2:24 PM

## 2019-06-14 NOTE — Progress Notes (Signed)
Pt has her home CPAP with her tonight. RT checked water for pt and asked if she needed any assistance pt states she can do it herself. RT will continue to monitor.

## 2019-06-14 NOTE — Discharge Instructions (Signed)

## 2019-06-14 NOTE — Progress Notes (Signed)
Assisted tele visit to patient with daughter.  Ayse Mccartin Samson, RN  

## 2019-06-15 DIAGNOSIS — Z9889 Other specified postprocedural states: Secondary | ICD-10-CM

## 2019-06-15 LAB — BASIC METABOLIC PANEL
Anion gap: 10 (ref 5–15)
BUN: 33 mg/dL — ABNORMAL HIGH (ref 8–23)
CO2: 26 mmol/L (ref 22–32)
Calcium: 7.9 mg/dL — ABNORMAL LOW (ref 8.9–10.3)
Chloride: 97 mmol/L — ABNORMAL LOW (ref 98–111)
Creatinine, Ser: 1.52 mg/dL — ABNORMAL HIGH (ref 0.44–1.00)
GFR calc Af Amer: 37 mL/min — ABNORMAL LOW (ref >60)
GFR calc non Af Amer: 32 mL/min — ABNORMAL LOW (ref >60)
Glucose, Bld: 168 mg/dL — ABNORMAL HIGH (ref 70–99)
Potassium: 2.7 mmol/L — CL (ref 3.5–5.1)
Sodium: 133 mmol/L — ABNORMAL LOW (ref 135–145)

## 2019-06-15 LAB — ECHO INTRAOPERATIVE TEE
Height: 65 in
Weight: 2698.43 oz

## 2019-06-15 LAB — CBC
HCT: 28.1 % — ABNORMAL LOW (ref 36.0–46.0)
Hemoglobin: 9.5 g/dL — ABNORMAL LOW (ref 12.0–15.0)
MCH: 32.8 pg (ref 26.0–34.0)
MCHC: 33.8 g/dL (ref 30.0–36.0)
MCV: 96.9 fL (ref 80.0–100.0)
Platelets: 88 10*3/uL — ABNORMAL LOW (ref 150–400)
RBC: 2.9 MIL/uL — ABNORMAL LOW (ref 3.87–5.11)
RDW: 13.1 % (ref 11.5–15.5)
WBC: 18.2 10*3/uL — ABNORMAL HIGH (ref 4.0–10.5)
nRBC: 0 % (ref 0.0–0.2)

## 2019-06-15 LAB — GLUCOSE, CAPILLARY
Glucose-Capillary: 102 mg/dL — ABNORMAL HIGH (ref 70–99)
Glucose-Capillary: 111 mg/dL — ABNORMAL HIGH (ref 70–99)

## 2019-06-15 LAB — PROTIME-INR
INR: 1.6 — ABNORMAL HIGH (ref 0.8–1.2)
Prothrombin Time: 18.6 seconds — ABNORMAL HIGH (ref 11.4–15.2)

## 2019-06-15 MED ORDER — POTASSIUM CHLORIDE 10 MEQ/50ML IV SOLN
10.0000 meq | Freq: Once | INTRAVENOUS | Status: AC
  Administered 2019-06-15: 10 meq via INTRAVENOUS
  Filled 2019-06-15: qty 50

## 2019-06-15 MED ORDER — POTASSIUM CHLORIDE 10 MEQ/50ML IV SOLN
10.0000 meq | INTRAVENOUS | Status: AC
  Administered 2019-06-15 (×2): 10 meq via INTRAVENOUS
  Filled 2019-06-15 (×3): qty 50

## 2019-06-15 MED ORDER — POTASSIUM CHLORIDE 10 MEQ/50ML IV SOLN
10.0000 meq | INTRAVENOUS | Status: AC
  Administered 2019-06-15 (×4): 10 meq via INTRAVENOUS
  Filled 2019-06-15 (×2): qty 50

## 2019-06-15 NOTE — Progress Notes (Addendum)
      [address]       [place] [address]             [phone]        CARDIOTHORACIC SURGERY PROGRESS NOTE   R3 Days Post-Op Procedure(s) (LRB): MINIMALLY INVASIVE TRICUSPID VALVE REPAIR USING EDWARDS MC3 T28 ANNULOPLASTY RING, INSERTION TEMPORARY TRANSVENOUS AV PACING LEAD (Right) MINIMALLY INVASIVE MAZE PROCEDURE (N/A) TRANSESOPHAGEAL ECHOCARDIOGRAM (TEE) (N/A)  Subjective: Feels well.  No pain or SOB  Objective: Vital signs: BP Readings from Last 1 Encounters:  06/15/19 108/73   Pulse Readings from Last 1 Encounters:  06/15/19 69   Resp Readings from Last 1 Encounters:  06/15/19 18   Temp Readings from Last 1 Encounters:  06/15/19 98.5 F (36.9 C) (Oral)    Hemodynamics: CVP:  [8 mmHg-22 mmHg] 8 mmHg  Physical Exam:  Rhythm:   VVI paced - profound bradycardia underneath  Breath sounds: clear  Heart sounds:  RRR w/out murmur  Incisions:  Dressings clean and dry  Abdomen:  Soft, non-distended, non-tender  Extremities:  Warm, well-perfused  Chest tubes:  decreasing volume thin serosanguinous output, no air leak     Intake/Output from previous day: 06/19 0701 - 06/20 0700 In: 1262.7 [P.O.:240; I.V.:915.7; IV Piggyback:107.1] Out: 1930 [Urine:1580; Chest Tube:350] Intake/Output this shift: Total I/O In: -  Out: 40 [Chest Tube:40]  Lab Results:  CBC: Recent Labs    06/14/19 0353 06/15/19 0414  WBC 22.3* 18.2*  HGB 10.1* 9.5*  HCT 31.1* 28.1*  PLT 95* 88*    BMET:  Recent Labs    06/14/19 0353 06/15/19 0414  NA 135 133*  K 4.2 2.7*  CL 102 97*  CO2 24 26  GLUCOSE 127* 168*  BUN 22 33*  CREATININE 1.32* 1.52*  CALCIUM 8.3* 7.9*     PT/INR:   Recent Labs    06/15/19 0414  LABPROT 18.6*  INR 1.6*    CBG (last 3)  Recent Labs    06/14/19 2018 06/15/19 0003 06/15/19 0414  GLUCAP 117* 102* 111*    ABG    Component Value Date/Time   PHART 7.307 (L) 06/13/2019 0118   PCO2ART 42.2 06/13/2019 0118   PO2ART 91.0 06/13/2019 0118   HCO3 21.0 06/13/2019 0118   TCO2 22 06/13/2019 0118   ACIDBASEDEF 5.0 (H) 06/13/2019 0118   O2SAT 59.6 06/13/2019 0405    CXR: n/a  Assessment/Plan: S/P Procedure(s) (LRB): MINIMALLY INVASIVE TRICUSPID VALVE REPAIR USING EDWARDS MC3 T28 ANNULOPLASTY RING, INSERTION TEMPORARY TRANSVENOUS AV PACING LEAD (Right) MINIMALLY INVASIVE MAZE PROCEDURE (N/A) TRANSESOPHAGEAL ECHOCARDIOGRAM (TEE) (N/A)  Overall stable POD3 Remains pacer-dependent w/ profound bradycardia underneath Breathing comfortably w/ O2 sats 92-93% on room air 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

## 2019-06-15 NOTE — Progress Notes (Signed)
RT checked to see if pt needed anything for her home CPAP. Pt water is adequate in machine. Pt can place self on machine when ready. RT expressed if she needs assistance to have RN call RT. RT will continue to monitor.

## 2019-06-15 NOTE — Progress Notes (Signed)
Patient c/o feeling SOB at rest. Refusing to walk or get a bath d/t SOB. Lungs sounds on the right have crackles. She continues on the Lasix drip. Has had 600 ml's out this shift. 02 sats 96% on RA. Dr. Roxy Manns notified, and no changes at this time. Will continue to monitor closely.

## 2019-06-15 NOTE — Progress Notes (Signed)
[facility] BRIEF SICU PROGRESS NOTE  3 Days Post-Op  S/P Procedure(s) (LRB): MINIMALLY INVASIVE TRICUSPID VALVE REPAIR USING EDWARDS MC3 T28 ANNULOPLASTY RING, INSERTION TEMPORARY TRANSVENOUS AV PACING LEAD (Right) MINIMALLY INVASIVE MAZE PROCEDURE (N/A) TRANSESOPHAGEAL ECHOCARDIOGRAM (TEE) (N/A)   Stable day Some increased SOB but O2 sats stable on room air Maintaining VVI pacing w/ stable BP UOP adequate on lasix drip  Plan: Continue current plan  Rexene Alberts, MD 06/15/2019 5:46 PM

## 2019-06-15 NOTE — Progress Notes (Signed)
Patient ID: Adriana Hendricks, female   DOB: 23-Aug-1935, 84 y.o.   MRN: [id number]   Progress Note  Patient Name: Adriana Hendricks Date of Encounter: 06/15/2019  Primary Cardiologist: Glenetta Hew, MD   Subjective   "I am feeling good."  Inpatient Medications    Scheduled Meds: . acetaminophen  1,000 mg Oral Q6H  . aspirin EC  81 mg Oral Daily  . bisacodyl  10 mg Oral Daily   Or  . bisacodyl  10 mg Rectal Daily  . Chlorhexidine Gluconate Cloth  6 each Topical Daily  . docusate sodium  200 mg Oral Daily  . enoxaparin (LOVENOX) injection  30 mg Subcutaneous QHS  . mouth rinse  15 mL Mouth Rinse BID  . pantoprazole  40 mg Oral Daily  . sodium chloride flush  10-40 mL Intracatheter Q12H  . sodium chloride flush  3 mL Intravenous Q12H  . warfarin  2 mg Oral q1800  . Warfarin - Physician Dosing Inpatient   Does not apply q1800   Continuous Infusions: . sodium chloride    . furosemide (LASIX) infusion 10 mg/hr (06/15/19 0601)  . lactated ringers Stopped (06/15/19 0552)  . potassium chloride 10 mEq (06/15/19 0907)   PRN Meds: sodium chloride, lip balm, metoprolol tartrate, morphine injection, ondansetron (ZOFRAN) IV, oxyCODONE, sodium chloride flush, sodium chloride flush, traMADol   Vital Signs    Vitals:   06/15/19 0500 06/15/19 0600 06/15/19 0700 06/15/19 0800  BP: 102/69 98/60 111/72 108/73  Pulse: 69 66 70 69  Resp: 11 19 20 18   Temp:      TempSrc:      SpO2: 94% 94% 95% 93%  Weight:  79.2 kg    Height:        Intake/Output Summary (Last 24 hours) at 06/15/2019 1046 Last data filed at 06/15/2019 0800 Gross per 24 hour  Intake 805.42 ml  Output 1640 ml  Net -834.58 ml   Filed Weights   06/13/19 0500 06/14/19 0431 06/15/19 0600  Weight: 85.1 kg 81.5 kg 79.2 kg    Telemetry    Atrial pacing - Personally Reviewed  ECG    none - Personally Reviewed  Physical Exam   GEN: No acute distress.   Neck: No JVD Cardiac: RRR, no murmurs, rubs, or gallops.   Respiratory: no increased work of breathing GI: Soft, nontender, non-distended  MS: 1+ edema; No deformity. Neuro:  Nonfocal  Psych: Normal affect   Labs    Chemistry Recent Labs  Lab 06/10/19 1008  06/13/19 1605 06/14/19 0353 06/15/19 0414  NA 132*   < > 135 135 133*  K 4.3   < > 4.2 4.2 2.7*  CL 98   < > 104 102 97*  CO2 20*   < > 23 24 26   GLUCOSE 117*   < > 143* 127* 168*  BUN 15   < > 16 22 33*  CREATININE 0.75   < > 1.05* 1.32* 1.52*  CALCIUM 9.3   < > 7.9* 8.3* 7.9*  PROT 7.1  --   --   --   --   ALBUMIN 4.1  --   --   --   --   AST 42*  --   --   --   --   ALT 25  --   --   --   --   ALKPHOS 82  --   --   --   --   BILITOT 1.6*  --   --   --   --  GFRNONAA >60   < > 50* 38* 32*  GFRAA >60   < > 58* 44* 37*  ANIONGAP 14   < > 8 9 10    < > = values in this interval not displayed.     Hematology Recent Labs  Lab 06/13/19 1605 06/14/19 0353 06/15/19 0414  WBC 20.5* 22.3* 18.2*  RBC 3.21* 3.13* 2.90*  HGB 10.6* 10.1* 9.5*  HCT 31.5* 31.1* 28.1*  MCV 98.1 99.4 96.9  MCH 33.0 32.3 32.8  MCHC 33.7 32.5 33.8  RDW 12.9 13.1 13.1  PLT 90* 95* 88*    Cardiac EnzymesNo results for input(s): TROPONINI in the last 168 hours. No results for input(s): TROPIPOC in the last 168 hours.   BNPNo results for input(s): BNP, PROBNP in the last 168 hours.   DDimer No results for input(s): DDIMER in the last 168 hours.   Radiology    Dg Chest Port 1 View  Result Date: 06/14/2019 CLINICAL DATA:  Tricuspid valve repair.  Sore chest. EXAM: PORTABLE CHEST 1 VIEW COMPARISON:  06/13/2019. FINDINGS: Right chest tube in stable position. Mediastinal drainage tubing in stable position. Right IJ pacing line in stable position. Left IJ line in stable position. Again catheter is noted coursing from the right supraclavicular region and projected over the left mediastinum for which clinical correlation suggested, this is of uncertain etiology and intravascular location again cannot be  confirmed. Prior cardiac valve replacement. Stable cardiomegaly. Improved aeration in the lung bases. Mild infiltrates/edema right mid lung again noted. No prominent pleural effusion. Degenerative. Changes scoliosis thoracic spine mild right chest wall subcutaneous emphysema. IMPRESSION: 1. Lines and tubes including right chest tube in unchanged. See complete discussion above. No pneumothorax. 2.  Prior cardiac valve replacement.  Stable cardiomegaly. 3. Improved aeration in the lung bases. Mild infiltrates/edema right mid lung again noted. No prominent pleural effusion. Electronically Signed   By: Marcello Moores  Register   On: 06/14/2019 06:52    Cardiac Studies   none  Patient Profile     84 y.o. female admitted for TV repair and MAZE with post op sinus node dysfunction. She is atrial pacing today down to 35.   Assessment & Plan    1. Sinus node dysfunction - she is toleraing her atrial pacing. We will follow. I will plan for PPM on Monday unless she gets better.       For questions or updates, please contact [place] Please consult [email or web address] for contact info under Cardiology/STEMI.      Signed, Cristopher Peru, MD  06/15/2019, 10:46 AM

## 2019-06-15 NOTE — Anesthesia Postprocedure Evaluation (Signed)
Anesthesia Post Note  Patient: Adriana Hendricks  Procedure(s) Performed: MINIMALLY INVASIVE TRICUSPID VALVE REPAIR USING EDWARDS MC3 T28 ANNULOPLASTY RING, INSERTION TEMPORARY TRANSVENOUS AV PACING LEAD (Right Chest) MINIMALLY INVASIVE MAZE PROCEDURE (N/A ) TRANSESOPHAGEAL ECHOCARDIOGRAM (TEE) (N/A )     Patient location during evaluation: SICU Anesthesia Type: General Level of consciousness: sedated Pain management: pain level controlled Vital Signs Assessment: post-procedure vital signs reviewed and stable Respiratory status: patient remains intubated per anesthesia plan Cardiovascular status: stable Postop Assessment: no apparent nausea or vomiting Anesthetic complications: no    Last Vitals:  Vitals:   06/15/19 0700 06/15/19 0800  BP: 111/72 108/73  Pulse: 70 69  Resp: 20 18  Temp:    SpO2: 95% 93%    Last Pain:  Vitals:   06/15/19 0800  TempSrc:   PainSc: 0-No pain                 Breslyn Abdo S

## 2019-06-16 ENCOUNTER — Inpatient Hospital Stay (HOSPITAL_COMMUNITY): Payer: Medicare Other

## 2019-06-16 LAB — CBC
HCT: 29.3 % — ABNORMAL LOW (ref 36.0–46.0)
Hemoglobin: 10 g/dL — ABNORMAL LOW (ref 12.0–15.0)
MCH: 32.8 pg (ref 26.0–34.0)
MCHC: 34.1 g/dL (ref 30.0–36.0)
MCV: 96.1 fL (ref 80.0–100.0)
Platelets: 119 10*3/uL — ABNORMAL LOW (ref 150–400)
RBC: 3.05 MIL/uL — ABNORMAL LOW (ref 3.87–5.11)
RDW: 13.1 % (ref 11.5–15.5)
WBC: 18.1 10*3/uL — ABNORMAL HIGH (ref 4.0–10.5)
nRBC: 0.2 % (ref 0.0–0.2)

## 2019-06-16 LAB — BASIC METABOLIC PANEL
Anion gap: 13 (ref 5–15)
BUN: 33 mg/dL — ABNORMAL HIGH (ref 8–23)
CO2: 31 mmol/L (ref 22–32)
Calcium: 8.4 mg/dL — ABNORMAL LOW (ref 8.9–10.3)
Chloride: 91 mmol/L — ABNORMAL LOW (ref 98–111)
Creatinine, Ser: 1.39 mg/dL — ABNORMAL HIGH (ref 0.44–1.00)
GFR calc Af Amer: 41 mL/min — ABNORMAL LOW (ref >60)
GFR calc non Af Amer: 35 mL/min — ABNORMAL LOW (ref >60)
Glucose, Bld: 117 mg/dL — ABNORMAL HIGH (ref 70–99)
Potassium: 2.4 mmol/L — CL (ref 3.5–5.1)
Sodium: 135 mmol/L (ref 135–145)

## 2019-06-16 LAB — POTASSIUM: Potassium: 3.1 mmol/L — ABNORMAL LOW (ref 3.5–5.1)

## 2019-06-16 LAB — PROTIME-INR
INR: 1.2 (ref 0.8–1.2)
Prothrombin Time: 15.5 seconds — ABNORMAL HIGH (ref 11.4–15.2)

## 2019-06-16 LAB — MAGNESIUM: Magnesium: 2 mg/dL (ref 1.7–2.4)

## 2019-06-16 MED ORDER — WARFARIN SODIUM 2.5 MG PO TABS
2.5000 mg | ORAL_TABLET | Freq: Every day | ORAL | Status: DC
  Administered 2019-06-16 – 2019-06-17 (×2): 2.5 mg via ORAL
  Filled 2019-06-16 (×2): qty 1

## 2019-06-16 MED ORDER — FLUTICASONE PROPIONATE 50 MCG/ACT NA SUSP
2.0000 | Freq: Every day | NASAL | Status: DC
  Administered 2019-06-19: 2 via NASAL
  Filled 2019-06-16: qty 16

## 2019-06-16 MED ORDER — POTASSIUM CHLORIDE 10 MEQ/50ML IV SOLN
10.0000 meq | INTRAVENOUS | Status: AC
  Administered 2019-06-16 (×5): 10 meq via INTRAVENOUS
  Filled 2019-06-16: qty 50

## 2019-06-16 MED ORDER — POTASSIUM CHLORIDE 10 MEQ/50ML IV SOLN
10.0000 meq | INTRAVENOUS | Status: AC
  Administered 2019-06-16 – 2019-06-17 (×4): 10 meq via INTRAVENOUS
  Filled 2019-06-16 (×4): qty 50

## 2019-06-16 MED ORDER — POTASSIUM CHLORIDE 10 MEQ/50ML IV SOLN
10.0000 meq | INTRAVENOUS | Status: AC
  Administered 2019-06-16 (×3): 10 meq via INTRAVENOUS
  Filled 2019-06-16 (×3): qty 50

## 2019-06-16 MED ORDER — MAGNESIUM SULFATE 2 GM/50ML IV SOLN
2.0000 g | Freq: Once | INTRAVENOUS | Status: AC
  Administered 2019-06-16: 2 g via INTRAVENOUS
  Filled 2019-06-16: qty 50

## 2019-06-16 NOTE — Progress Notes (Signed)
[address]       [place] [address]             [phone]        CARDIOTHORACIC SURGERY PROGRESS NOTE   R4 Days Post-Op Procedure(s) (LRB): MINIMALLY INVASIVE TRICUSPID VALVE REPAIR USING EDWARDS MC3 T28 ANNULOPLASTY RING, INSERTION TEMPORARY TRANSVENOUS AV PACING LEAD (Right) MINIMALLY INVASIVE MAZE PROCEDURE (N/A) TRANSESOPHAGEAL ECHOCARDIOGRAM (TEE) (N/A)  Subjective: Looks good and reports feeling much better.  Just ambulated around ICU on room air.  No dyspnea  Objective: Vital signs: BP Readings from Last 1 Encounters:  06/16/19 109/73   Pulse Readings from Last 1 Encounters:  06/16/19 70   Resp Readings from Last 1 Encounters:  06/16/19 14   Temp Readings from Last 1 Encounters:  06/16/19 98.1 F (36.7 C) (Oral)    Hemodynamics: CVP:  [15 mmHg] 15 mmHg  Physical Exam:  Rhythm:   Profound sinus bradycardia under pacer - remains pacer-dependent  Breath sounds: Diminished at bases, otherwise clear  Heart sounds:  RRR w/out murmur  Incisions:  Dressing dry, intact  Abdomen:  Soft, non-distended, non-tender  Extremities:  Warm, well-perfused  Chest tubes:  Decreasing but significant volume thin serosanguinous output, no air leak    Intake/Output from previous day: 06/20 0701 - 06/21 0700 In: 1777.8 [P.O.:1000; I.V.:258.5; IV Piggyback:519.3] Out: 4710 [Urine:4400; Chest Tube:310] Intake/Output this shift: Total I/O In: 41.2 [I.V.:10; IV Piggyback:31.2] Out: 450 [Urine:350; Chest Tube:100]  Lab Results:  CBC: Recent Labs    06/15/19 0414 06/16/19 0517  WBC 18.2* 18.1*  HGB 9.5* 10.0*  HCT 28.1* 29.3*  PLT 88* 119*    BMET:  Recent Labs    06/15/19 0414 06/16/19 0517  NA 133* 135  K 2.7* 2.4*  CL 97* 91*  CO2 26 31  GLUCOSE 168* 117*  BUN 33* 33*  CREATININE 1.52* 1.39*  CALCIUM 7.9* 8.4*     PT/INR:   Recent Labs    06/16/19 0517  LABPROT 15.5*  INR 1.2    CBG (last 3)  Recent Labs    06/14/19  2018 06/15/19 0003 06/15/19 0414  GLUCAP 117* 102* 111*    ABG    Component Value Date/Time   PHART 7.307 (L) 06/13/2019 0118   PCO2ART 42.2 06/13/2019 0118   PO2ART 91.0 06/13/2019 0118   HCO3 21.0 06/13/2019 0118   TCO2 22 06/13/2019 0118   ACIDBASEDEF 5.0 (H) 06/13/2019 0118   O2SAT 59.6 06/13/2019 0405    CXR: PORTABLE CHEST 1 VIEW  COMPARISON:  06/14/2019 chest radiograph.  FINDINGS: Stable position of right apical chest tube and mediastinal drain. Stable cardiomediastinal silhouette with mild cardiomegaly. Small right apical pneumothorax, approximately 5-10%, stable. No left pneumothorax. Stable small left pleural effusion. No right pleural effusion. No overt pulmonary edema. Improved aeration at the lung bases with persistent mild bibasilar atelectasis.  IMPRESSION: 1. Small right apical pneumothorax, stable. 2. Stable small left pleural effusion. 3. Stable cardiomegaly without overt pulmonary edema. 4. Improved aeration at the lung bases with persistent mild bibasilar atelectasis.   Electronically Signed   By: Ilona Sorrel M.D.   On: 06/16/2019 08:43  Assessment/Plan: S/P Procedure(s) (LRB): MINIMALLY INVASIVE TRICUSPID VALVE REPAIR USING EDWARDS MC3 T28 ANNULOPLASTY RING, INSERTION TEMPORARY TRANSVENOUS AV PACING LEAD (Right) MINIMALLY INVASIVE MAZE PROCEDURE (N/A) TRANSESOPHAGEAL ECHOCARDIOGRAM (TEE) (N/A)  Overall stable POD4 Remains pacer-dependent w/ profound bradycardia underneath Breathing comfortably w/ O2 sats 96-99% on room air Acute on chronic diastolic and right-sided  CHF with expected post-op volume excess, diuresing well on lasix and weight down 3 kg, close to preop baseline Expected post op acute blood loss anemia, Hgb 10.0 stable Post op thrombocytopenia, platelet count recovering 119k Post op leukocytosis w/out fever, likely reactive, trending down Hypokalemia, induced by loop diuretics   Keep in ICU until rhythm recovers or  PPM in place  Stop lasix drip later today  Leave chest tubes until drainage decreased  Mobilize as much as possible  Watch anemia, platelets  Supplement potassium  Agree w/ plans for PPM tomorrow  Rexene Alberts, MD 06/16/2019 10:19 AM

## 2019-06-16 NOTE — Progress Notes (Signed)
Progress Note  Patient Name: CAELYNN MARSHMAN Date of Encounter: 06/16/2019  Primary Cardiologist: Glenetta Hew, MD   Subjective   "I feel ok."  Inpatient Medications    Scheduled Meds: . acetaminophen  1,000 mg Oral Q6H  . aspirin EC  81 mg Oral Daily  . bisacodyl  10 mg Oral Daily   Or  . bisacodyl  10 mg Rectal Daily  . Chlorhexidine Gluconate Cloth  6 each Topical Daily  . docusate sodium  200 mg Oral Daily  . enoxaparin (LOVENOX) injection  30 mg Subcutaneous QHS  . mouth rinse  15 mL Mouth Rinse BID  . pantoprazole  40 mg Oral Daily  . sodium chloride flush  10-40 mL Intracatheter Q12H  . sodium chloride flush  3 mL Intravenous Q12H  . warfarin  2 mg Oral q1800  . Warfarin - Physician Dosing Inpatient   Does not apply q1800   Continuous Infusions: . sodium chloride    . furosemide (LASIX) infusion 10 mg/hr (06/16/19 0854)  . lactated ringers Stopped (06/15/19 0552)  . potassium chloride 10 mEq (06/16/19 0852)   PRN Meds: sodium chloride, lip balm, metoprolol tartrate, morphine injection, ondansetron (ZOFRAN) IV, oxyCODONE, sodium chloride flush, sodium chloride flush, traMADol   Vital Signs    Vitals:   06/16/19 0500 06/16/19 0600 06/16/19 0700 06/16/19 0818  BP: 116/61 115/62 105/66   Pulse: 69 70 70   Resp: 14 14 20    Temp:    98.1 F (36.7 C)  TempSrc:    Oral  SpO2: 100% 94% 96%   Weight:  76.1 kg    Height:        Intake/Output Summary (Last 24 hours) at 06/16/2019 0919 Last data filed at 06/16/2019 0800 Gross per 24 hour  Intake 1651.95 ml  Output 4745 ml  Net -3093.05 ml   Filed Weights   06/14/19 0431 06/15/19 0600 06/16/19 0600  Weight: 81.5 kg 79.2 kg 76.1 kg    Telemetry    nsr with atrial pacing - Personally Reviewed  ECG    none - Personally Reviewed  Physical Exam   GEN: No acute distress.   Neck: No JVD Cardiac: RRR Respiratory: Clear to auscultation bilaterally. GI: Soft, nontender, non-distended  MS: No edema; No  deformity. Neuro:  Nonfocal  Psych: Normal affect   Labs    Chemistry Recent Labs  Lab 06/10/19 1008  06/14/19 0353 06/15/19 0414 06/16/19 0517  NA 132*   < > 135 133* 135  K 4.3   < > 4.2 2.7* 2.4*  CL 98   < > 102 97* 91*  CO2 20*   < > 24 26 31   GLUCOSE 117*   < > 127* 168* 117*  BUN 15   < > 22 33* 33*  CREATININE 0.75   < > 1.32* 1.52* 1.39*  CALCIUM 9.3   < > 8.3* 7.9* 8.4*  PROT 7.1  --   --   --   --   ALBUMIN 4.1  --   --   --   --   AST 42*  --   --   --   --   ALT 25  --   --   --   --   ALKPHOS 82  --   --   --   --   BILITOT 1.6*  --   --   --   --   GFRNONAA >60   < > 38* 32* 35*  GFRAA >60   < > 44* 37* 41*  ANIONGAP 14   < > 9 10 13    < > = values in this interval not displayed.     Hematology Recent Labs  Lab 06/14/19 0353 06/15/19 0414 06/16/19 0517  WBC 22.3* 18.2* 18.1*  RBC 3.13* 2.90* 3.05*  HGB 10.1* 9.5* 10.0*  HCT 31.1* 28.1* 29.3*  MCV 99.4 96.9 96.1  MCH 32.3 32.8 32.8  MCHC 32.5 33.8 34.1  RDW 13.1 13.1 13.1  PLT 95* 88* 119*    Cardiac EnzymesNo results for input(s): TROPONINI in the last 168 hours. No results for input(s): TROPIPOC in the last 168 hours.   BNPNo results for input(s): BNP, PROBNP in the last 168 hours.   DDimer No results for input(s): DDIMER in the last 168 hours.   Radiology    Dg Chest Port 1 View  Result Date: 06/16/2019 CLINICAL DATA:  Atelectasis EXAM: PORTABLE CHEST 1 VIEW COMPARISON:  06/14/2019 chest radiograph. FINDINGS: Stable position of right apical chest tube and mediastinal drain. Stable cardiomediastinal silhouette with mild cardiomegaly. Small right apical pneumothorax, approximately 5-10%, stable. No left pneumothorax. Stable small left pleural effusion. No right pleural effusion. No overt pulmonary edema. Improved aeration at the lung bases with persistent mild bibasilar atelectasis. IMPRESSION: 1. Small right apical pneumothorax, stable. 2. Stable small left pleural effusion. 3. Stable  cardiomegaly without overt pulmonary edema. 4. Improved aeration at the lung bases with persistent mild bibasilar atelectasis. Electronically Signed   By: Ilona Sorrel M.D.   On: 06/16/2019 08:43    Cardiac Studies   none  Patient Profile     84 y.o. female admitted for TVR/MAZE, now with postop severe sinus node dysfunction.  Assessment & Plan    1. Sinus node dysfunction - I turned her down to 35/min today and she had an occaisional escape as well as sinus beat but was mostly atrial pacing. I will plan for PPM tomorrow unless her sinus node function returns overnight. 2. TR - she is s/p TV repair. I will discuss whether a lead across her TV is acceptable or whether a lead need placing in the LV instead.  For questions or updates, please contact [place] Please consult [email or web address] for contact info under Cardiology/STEMI.   Signed, Cristopher Peru, MD  06/16/2019, 9:19 AM  Patient ID: [name], female   DOB: [birth year], 84 y.o.   MRN: [id number]

## 2019-06-16 NOTE — Progress Notes (Signed)
Patient rests in recliner on room air at 97%. Pt was able to ambulate the hall (370 ft) with avasys walker on 4 liters [place] resting 5 times for about 30 seconds a piece. 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 Pt now resting comfortably in recliner. Will continue to monitor closely.  Lucius Conn, RN

## 2019-06-16 NOTE — Progress Notes (Signed)
RT set pt's home CPAP unit.  water added chamber.

## 2019-06-17 ENCOUNTER — Encounter (HOSPITAL_COMMUNITY)
Admission: RE | Disposition: A | Discharge status: Home / Self Care | Payer: Self-pay | Source: Home / Self Care | Attending: Thoracic Surgery (Cardiothoracic Vascular Surgery)

## 2019-06-17 ENCOUNTER — Encounter (HOSPITAL_COMMUNITY): Payer: Self-pay | Admitting: Thoracic Surgery (Cardiothoracic Vascular Surgery)

## 2019-06-17 DIAGNOSIS — I495 Sick sinus syndrome: Secondary | ICD-10-CM | POA: Diagnosis not present

## 2019-06-17 LAB — BASIC METABOLIC PANEL
Anion gap: 10 (ref 5–15)
BUN: 31 mg/dL — ABNORMAL HIGH (ref 8–23)
CO2: 31 mmol/L (ref 22–32)
Calcium: 8.2 mg/dL — ABNORMAL LOW (ref 8.9–10.3)
Chloride: 92 mmol/L — ABNORMAL LOW (ref 98–111)
Creatinine, Ser: 1.25 mg/dL — ABNORMAL HIGH (ref 0.44–1.00)
GFR calc Af Amer: 47 mL/min — ABNORMAL LOW (ref >60)
GFR calc non Af Amer: 40 mL/min — ABNORMAL LOW (ref >60)
Glucose, Bld: 107 mg/dL — ABNORMAL HIGH (ref 70–99)
Potassium: 3.8 mmol/L (ref 3.5–5.1)
Sodium: 133 mmol/L — ABNORMAL LOW (ref 135–145)

## 2019-06-17 LAB — PROTIME-INR
INR: 1.3 — ABNORMAL HIGH (ref 0.8–1.2)
Prothrombin Time: 16.4 seconds — ABNORMAL HIGH (ref 11.4–15.2)

## 2019-06-17 SURGERY — PACEMAKER IMPLANT
Anesthesia: LOCAL

## 2019-06-17 MED ORDER — SODIUM CHLORIDE 0.9 % IV SOLN: INTRAVENOUS | Status: DC

## 2019-06-17 MED ORDER — FE FUMARATE-B12-VIT C-FA-IFC PO CAPS
1.0000 | ORAL_CAPSULE | Freq: Every day | ORAL | Status: DC
  Administered 2019-06-17 – 2019-06-19 (×3): 1 via ORAL
  Filled 2019-06-17 (×3): qty 1

## 2019-06-17 MED ORDER — SODIUM CHLORIDE 0.9 % IV SOLN: 80.0000 mg | INTRAVENOUS | Status: DC

## 2019-06-17 MED ORDER — VANCOMYCIN HCL IN DEXTROSE 1-5 GM/200ML-% IV SOLN: 1000.0000 mg | INTRAVENOUS | Status: DC

## 2019-06-17 NOTE — Progress Notes (Signed)
Adriana Libra MD about continued hypokalemia and removal of central line. MD ordered to give 4 more runs of K and then remove central line.

## 2019-06-17 NOTE — Progress Notes (Signed)
      [address]       [place] [address]             [phone]        CARDIOTHORACIC SURGERY PROGRESS NOTE   R5 Days Post-Op Procedure(s) (LRB): MINIMALLY INVASIVE TRICUSPID VALVE REPAIR USING EDWARDS MC3 T28 ANNULOPLASTY RING, INSERTION TEMPORARY TRANSVENOUS AV PACING LEAD (Right) MINIMALLY INVASIVE MAZE PROCEDURE (N/A) TRANSESOPHAGEAL ECHOCARDIOGRAM (TEE) (N/A)  Subjective: Looks great.  No pain or SOB.  Encouraged by rhythm  Objective: Vital signs: BP Readings from Last 1 Encounters:  06/17/19 113/66   Pulse Readings from Last 1 Encounters:  06/17/19 70   Resp Readings from Last 1 Encounters:  06/17/19 10   Temp Readings from Last 1 Encounters:  06/17/19 97.6 F (36.4 C) (Axillary)    Hemodynamics:    Physical Exam:  Rhythm:   Sinus w/ HR 60's  Breath sounds: clear  Heart sounds:  RRR  Incisions:  Clean and dry  Abdomen:  Soft, non-distended, non-tender  Extremities:  Warm, well-perfused  Chest tubes:  low volume thin serosanguinous output, no air leak     Intake/Output from previous day: 06/21 0701 - 06/22 0700 In: 725.4 [P.O.:240; I.V.:124.4; IV Piggyback:361] Out: 8270 [Urine:1650; Chest Tube:160] Intake/Output this shift: No intake/output data recorded.  Lab Results:  CBC: Recent Labs    06/15/19 0414 06/16/19 0517  WBC 18.2* 18.1*  HGB 9.5* 10.0*  HCT 28.1* 29.3*  PLT 88* 119*    BMET:  Recent Labs    06/16/19 0517 06/16/19 2013 06/17/19 0310  NA 135  --  133*  K 2.4* 3.1* 3.8  CL 91*  --  92*  CO2 31  --  31  GLUCOSE 117*  --  107*  BUN 33*  --  31*  CREATININE 1.39*  --  1.25*  CALCIUM 8.4*  --  8.2*     PT/INR:   Recent Labs    06/17/19 0310  LABPROT 16.4*  INR 1.3*    CBG (last 3)  Recent Labs    06/14/19 2018 06/15/19 0003 06/15/19 0414  GLUCAP 117* 102* 111*    ABG    Component Value Date/Time   PHART 7.307 (L) 06/13/2019 0118   PCO2ART 42.2 06/13/2019 0118   PO2ART 91.0  06/13/2019 0118   HCO3 21.0 06/13/2019 0118   TCO2 22 06/13/2019 0118   ACIDBASEDEF 5.0 (H) 06/13/2019 0118   O2SAT 59.6 06/13/2019 0405    CXR: n/a  Assessment/Plan: S/P Procedure(s) (LRB): MINIMALLY INVASIVE TRICUSPID VALVE REPAIR USING EDWARDS MC3 T28 ANNULOPLASTY RING, INSERTION TEMPORARY TRANSVENOUS AV PACING LEAD (Right) MINIMALLY INVASIVE MAZE PROCEDURE (N/A) TRANSESOPHAGEAL ECHOCARDIOGRAM (TEE) (N/A)  Doing well POD5 Sinus node dysfunction but rhythm improving, currently sinus w/ HR 60's Breathing comfortably w/ O2 sats 95-97% on room air Acute on chronic diastolicand right-sidedCHF with expected post-op volume excess,appears to be nearly resolved, weight close to preop baseline Expected post op acute blood loss anemia, Hgb 10 yesterday Post op thrombocytopenia,platelet count recovering 119k yesterday Post op leukocytosis w/out fever, likely reactive, trending down Hypokalemia, induced by loop diuretics, improved   It may be reasonable to hold of on PPM placement and observe  D/C chest tubes  Mobilize as much as possible  Watch anemia, platelets  Transfer 4E in HR remains stable  Continue Coumadin  Rexene Alberts, MD 06/17/2019 7:53 AM

## 2019-06-17 NOTE — Progress Notes (Signed)
CT surgery p.m. Rounds  Patient had good day, chest tubes removed Ambulated in hall without difficulty Maintaining heart rhythm 70/min off pacemaker Appetite and strength improving

## 2019-06-17 NOTE — Progress Notes (Signed)
Visit made to patients room to check to see if she was wearing her home CPAP and tolerating.  She states she will not wear it after tonight due to not having her cleaning system here at the hospital to clean it.  I advised her we could place her on our CPAP for tomorrow night she refused and states ours are to hot.  I advised patient if we can be of further assistance to let us know but at this time we do not have the cleaning system she has at home to offer her for her CPAP.

## 2019-06-17 NOTE — Progress Notes (Addendum)
Electrophysiology Rounding Note  Patient Name: Adriana Hendricks Date of Encounter: 06/17/2019  Primary Cardiologist: Glenetta Hew, MD  Electrophysiologist: New to practice   Subjective   Feeling OK this am. [facility] unit and felt winded, but recovered quickly. Questions answered about procedure.   Inpatient Medications    Scheduled Meds: . acetaminophen  1,000 mg Oral Q6H  . aspirin EC  81 mg Oral Daily  . bisacodyl  10 mg Oral Daily   Or  . bisacodyl  10 mg Rectal Daily  . Chlorhexidine Gluconate Cloth  6 each Topical Daily  . docusate sodium  200 mg Oral Daily  . enoxaparin (LOVENOX) injection  30 mg Subcutaneous QHS  . fluticasone  2 spray Each Nare Daily  . mouth rinse  15 mL Mouth Rinse BID  . pantoprazole  40 mg Oral Daily  . sodium chloride flush  10-40 mL Intracatheter Q12H  . sodium chloride flush  3 mL Intravenous Q12H  . warfarin  2.5 mg Oral q1800  . Warfarin - Physician Dosing Inpatient   Does not apply q1800   Continuous Infusions: . sodium chloride    . lactated ringers Stopped (06/15/19 0552)   PRN Meds: sodium chloride, lip balm, metoprolol tartrate, morphine injection, ondansetron (ZOFRAN) IV, oxyCODONE, sodium chloride flush, sodium chloride flush, traMADol   Vital Signs    Vitals:   06/17/19 0400 06/17/19 0500 06/17/19 0600 06/17/19 0700  BP: 107/67  (!) 125/102 113/66  Pulse: 70  69 70  Resp: 15 13 15 10   Temp: 97.6 F (36.4 C)     TempSrc: Axillary     SpO2: 96%  95% 95%  Weight:  75 kg    Height:        Intake/Output Summary (Last 24 hours) at 06/17/2019 0718 Last data filed at 06/17/2019 0530 Gross per 24 hour  Intake 725.42 ml  Output 1810 ml  Net -1084.58 ml   Filed Weights   06/15/19 0600 06/16/19 0600 06/17/19 0500  Weight: 79.2 kg 76.1 kg 75 kg    Physical Exam    GEN- The patient is well appearing, alert and oriented x 3 today.   Head- normocephalic, atraumatic Eyes-  Sclera clear, conjunctiva pink Ears- hearing intact  Oropharynx- clear Neck- supple Lungs- Normal work of breathing Heart- Regular rate and rhythm GI- soft, NT, ND, + BS Extremities- no clubbing, cyanosis, or edema Skin- no rash or lesion Psych- euthymic mood, full affect Neuro- strength and sensation are intact  Labs    CBC Recent Labs    06/15/19 0414 06/16/19 0517  WBC 18.2* 18.1*  HGB 9.5* 10.0*  HCT 28.1* 29.3*  MCV 96.9 96.1  PLT 88* 371*   Basic Metabolic Panel Recent Labs    06/16/19 0517 06/16/19 2013 06/17/19 0310  NA 135  --  133*  K 2.4* 3.1* 3.8  CL 91*  --  92*  CO2 31  --  31  GLUCOSE 117*  --  107*  BUN 33*  --  31*  CREATININE 1.39*  --  1.25*  CALCIUM 8.4*  --  8.2*  MG 2.0  --   --    Liver Function Tests No results for input(s): AST, ALT, ALKPHOS, BILITOT, PROT, ALBUMIN in the last 72 hours. No results for input(s): LIPASE, AMYLASE in the last 72 hours. Cardiac Enzymes No results for input(s): CKTOTAL, CKMB, CKMBINDEX, TROPONINI in the last 72 hours. BNP Invalid input(s): POCBNP D-Dimer No results for input(s): DDIMER in the last  72 hours. Hemoglobin A1C No results for input(s): HGBA1C in the last 72 hours. Fasting Lipid Panel No results for input(s): CHOL, HDL, LDLCALC, TRIG, CHOLHDL, LDLDIRECT in the last 72 hours. Thyroid Function Tests No results for input(s): TSH, T4TOTAL, T3FREE, THYROIDAB in the last 72 hours.  Invalid input(s): FREET3  Telemetry    A Paced at 100 - personally reviewed.  Radiology    Dg Chest Port 1 View  Result Date: 06/16/2019 CLINICAL DATA:  Atelectasis EXAM: PORTABLE CHEST 1 VIEW COMPARISON:  06/14/2019 chest radiograph. FINDINGS: Stable position of right apical chest tube and mediastinal drain. Stable cardiomediastinal silhouette with mild cardiomegaly. Small right apical pneumothorax, approximately 5-10%, stable. No left pneumothorax. Stable small left pleural effusion. No right pleural effusion. No overt pulmonary edema. Improved aeration at the lung  bases with persistent mild bibasilar atelectasis. IMPRESSION: 1. Small right apical pneumothorax, stable. 2. Stable small left pleural effusion. 3. Stable cardiomegaly without overt pulmonary edema. 4. Improved aeration at the lung bases with persistent mild bibasilar atelectasis. Electronically Signed   By: Ilona Sorrel M.D.   On: 06/16/2019 08:43    Patient Profile     Adriana Hendricks is a 84 y.o. female admitted for TVR/MAZE, now with postop severe SND   Assessment & Plan    1. SND - Dr Lovena Le turned to 35 bpm yesterday with occasional escape as well as sinus beat but mostly atrial pacing. Plan PPM today. Pt has no questions.  2. TR - s/p TVR. Dr. Lovena Le to discuss lead placement with Dr. Roxy Manns. (Across her TV vs LV lead)  For questions or updates, please contact [place] Please consult [email or web address] for contact info under Cardiology/STEMI.  Signed, Shirley Friar, PA-C  06/17/2019, 7:18 AM    EP Attending  Patient seen and examined. Agree with above. The patient's temp PM was turned to OOO mode today and she is conducting in NSR at 60 min. We will hold off on PPM for now. Ok to transfer to tele. Please call us if she has recurrent sinus bradycardia.  Mikle Bosworth.D.

## 2019-06-18 ENCOUNTER — Inpatient Hospital Stay (HOSPITAL_COMMUNITY): Payer: Medicare Other

## 2019-06-18 LAB — PROTIME-INR
INR: 1.5 — ABNORMAL HIGH (ref 0.8–1.2)
Prothrombin Time: 18 seconds — ABNORMAL HIGH (ref 11.4–15.2)

## 2019-06-18 LAB — BASIC METABOLIC PANEL
Anion gap: 11 (ref 5–15)
BUN: 24 mg/dL — ABNORMAL HIGH (ref 8–23)
CO2: 29 mmol/L (ref 22–32)
Calcium: 8.4 mg/dL — ABNORMAL LOW (ref 8.9–10.3)
Chloride: 91 mmol/L — ABNORMAL LOW (ref 98–111)
Creatinine, Ser: 1.04 mg/dL — ABNORMAL HIGH (ref 0.44–1.00)
GFR calc Af Amer: 58 mL/min — ABNORMAL LOW (ref >60)
GFR calc non Af Amer: 50 mL/min — ABNORMAL LOW (ref >60)
Glucose, Bld: 91 mg/dL (ref 70–99)
Potassium: 3.3 mmol/L — ABNORMAL LOW (ref 3.5–5.1)
Sodium: 131 mmol/L — ABNORMAL LOW (ref 135–145)

## 2019-06-18 LAB — CBC
HCT: 27.1 % — ABNORMAL LOW (ref 36.0–46.0)
Hemoglobin: 9.1 g/dL — ABNORMAL LOW (ref 12.0–15.0)
MCH: 32.5 pg (ref 26.0–34.0)
MCHC: 33.6 g/dL (ref 30.0–36.0)
MCV: 96.8 fL (ref 80.0–100.0)
Platelets: 158 10*3/uL (ref 150–400)
RBC: 2.8 MIL/uL — ABNORMAL LOW (ref 3.87–5.11)
RDW: 12.9 % (ref 11.5–15.5)
WBC: 12.6 10*3/uL — ABNORMAL HIGH (ref 4.0–10.5)
nRBC: 0 % (ref 0.0–0.2)

## 2019-06-18 MED ORDER — POTASSIUM CHLORIDE CRYS ER 20 MEQ PO TBCR
40.0000 meq | EXTENDED_RELEASE_TABLET | Freq: Three times a day (TID) | ORAL | Status: AC
  Administered 2019-06-18 (×3): 40 meq via ORAL
  Filled 2019-06-18 (×3): qty 2

## 2019-06-18 MED ORDER — POTASSIUM CHLORIDE CRYS ER 20 MEQ PO TBCR: 40.0000 meq | EXTENDED_RELEASE_TABLET | ORAL | Status: DC

## 2019-06-18 MED ORDER — CHLORTHALIDONE 25 MG PO TABS
25.0000 mg | ORAL_TABLET | Freq: Every day | ORAL | Status: DC
  Administered 2019-06-18: 25 mg via ORAL
  Filled 2019-06-18 (×2): qty 1

## 2019-06-18 MED ORDER — WARFARIN SODIUM 5 MG PO TABS
5.0000 mg | ORAL_TABLET | Freq: Once | ORAL | Status: AC
  Administered 2019-06-18: 5 mg via ORAL
  Filled 2019-06-18: qty 1

## 2019-06-18 MED ORDER — POTASSIUM CHLORIDE CRYS ER 20 MEQ PO TBCR
20.0000 meq | EXTENDED_RELEASE_TABLET | ORAL | Status: DC
  Administered 2019-06-18: 20 meq via ORAL
  Filled 2019-06-18: qty 1

## 2019-06-18 MED FILL — Sodium Bicarbonate IV Soln 8.4%: INTRAVENOUS | Qty: 50 | Status: AC

## 2019-06-18 MED FILL — Lidocaine HCl Local Soln Prefilled Syringe 100 MG/5ML (2%): INTRAMUSCULAR | Qty: 25 | Status: AC

## 2019-06-18 MED FILL — Sodium Chloride IV Soln 0.9%: INTRAVENOUS | Qty: 2000 | Status: AC

## 2019-06-18 MED FILL — Electrolyte-R (PH 7.4) Solution: INTRAVENOUS | Qty: 4000 | Status: AC

## 2019-06-18 MED FILL — Mannitol IV Soln 20%: INTRAVENOUS | Qty: 1000 | Status: AC

## 2019-06-18 NOTE — Progress Notes (Signed)
      [address]       [place] [address]             [phone]    POD # 5 mitral repair  More short of breath today  BP 98/61   Pulse 75   Temp 98.4 F (36.9 C) (Oral)   Resp 18   Ht 5\' 5"  (1.651 m)   Wt 76.2 kg   LMP  (LMP Unknown)   SpO2 97%   BMI 27.96 kg/m  No intake or output data in the 24 hours ending 06/18/19 1841  Weight below preop  Continue current Rx, awaiting bed on 4E  Rey Fors C. Roxan Hockey, MD [facility] [phone]

## 2019-06-18 NOTE — Plan of Care (Signed)
  Problem: Education: Goal: Knowledge of General Education information will improve Description: Including pain rating scale, medication(s)/side effects and non-pharmacologic comfort measures Outcome: Progressing   Problem: Health Behavior/Discharge Planning: Goal: Ability to manage health-related needs will improve Outcome: Progressing   Problem: Clinical Measurements: Goal: Ability to maintain clinical measurements within normal limits will improve Outcome: Progressing Goal: Will remain free from infection Outcome: Progressing Goal: Diagnostic test results will improve Outcome: Progressing Goal: Respiratory complications will improve Outcome: Progressing Goal: Cardiovascular complication will be avoided Outcome: Progressing   Problem: Activity: Goal: Risk for activity intolerance will decrease Outcome: Progressing   Problem: Nutrition: Goal: Adequate nutrition will be maintained Outcome: Progressing   Problem: Pain Managment: Goal: General experience of comfort will improve Outcome: Progressing   Problem: Safety: Goal: Ability to remain free from injury will improve Outcome: Progressing   Problem: Skin Integrity: Goal: Risk for impaired skin integrity will decrease Outcome: Progressing   Problem: Education: Goal: Will demonstrate proper wound care and an understanding of methods to prevent future damage Outcome: Progressing Goal: Knowledge of disease or condition will improve Outcome: Progressing Goal: Knowledge of the prescribed therapeutic regimen will improve Outcome: Progressing Goal: Individualized Educational Video(s) Outcome: Progressing   Problem: Activity: Goal: Risk for activity intolerance will decrease Outcome: Progressing   Problem: Cardiac: Goal: Will achieve and/or maintain hemodynamic stability Outcome: Progressing   Problem: Clinical Measurements: Goal: Postoperative complications will be avoided or minimized Outcome: Progressing    Problem: Respiratory: Goal: Respiratory status will improve Outcome: Progressing   Problem: Skin Integrity: Goal: Wound healing without signs and symptoms of infection Outcome: Progressing Goal: Risk for impaired skin integrity will decrease Outcome: Progressing   Problem: Urinary Elimination: Goal: Ability to achieve and maintain adequate renal perfusion and functioning will improve Outcome: Progressing

## 2019-06-18 NOTE — Progress Notes (Signed)
MD made aware of patient having increased fatigue and SOB throughout shift. Patient very short of breath with any exertion talking/walking and only able to ambulate 50 ft in hallway today. Vital signs have remained stable throughout shift and patient has no complaints of pain at this time. MD states at this time we will continue the current plan of care and monitor closely.

## 2019-06-18 NOTE — Progress Notes (Addendum)
[facility] DAILY ICU PROGRESS NOTE                   [address]            [place] [address]          [phone]   6 Days Post-Op Procedure(s) (LRB): MINIMALLY INVASIVE TRICUSPID VALVE REPAIR USING EDWARDS MC3 T28 ANNULOPLASTY RING, INSERTION TEMPORARY TRANSVENOUS AV PACING LEAD (Right) MINIMALLY INVASIVE MAZE PROCEDURE (N/A) TRANSESOPHAGEAL ECHOCARDIOGRAM (TEE) (N/A)  Total Length of Stay:  LOS: 6 days   Subjective: Awake and alert, did not rest well last night due to "too many interruptions". Otherwise doing well with no new problems.   Objective: Vital signs in last 24 hours: Temp:  [98 F (36.7 C)-98.5 F (36.9 C)] 98.4 F (36.9 C) (06/23 0400) Pulse Rate:  [49-70] 70 (06/23 0700) Cardiac Rhythm: Normal sinus rhythm (06/23 0400) Resp:  [11-23] 15 (06/23 0700) BP: (100-120)/(51-95) 102/54 (06/23 0700) SpO2:  [95 %-100 %] 96 % (06/23 0700) Weight:  [76.2 kg] 76.2 kg (06/23 0500)  Filed Weights   06/16/19 0600 06/17/19 0500 06/18/19 0500  Weight: 76.1 kg 75 kg 76.2 kg    Weight change: 1.2 kg     Intake/Output from previous day: 06/22 0701 - 06/23 0700 In: -  Out: 30 [Chest Tube:30]  Intake/Output this shift: No intake/output data recorded.  Current Meds: Scheduled Meds: . aspirin EC  81 mg Oral Daily  . bisacodyl  10 mg Oral Daily   Or  . bisacodyl  10 mg Rectal Daily  . Chlorhexidine Gluconate Cloth  6 each Topical Daily  . docusate sodium  200 mg Oral Daily  . enoxaparin (LOVENOX) injection  30 mg Subcutaneous QHS  . ferrous [id number] C-folic acid  1 capsule Oral Q breakfast  . fluticasone  2 spray Each Nare Daily  . gentamicin irrigation  80 mg Irrigation On Call  . mouth rinse  15 mL Mouth Rinse BID  . pantoprazole  40 mg Oral Daily  . potassium chloride  40 mEq Oral TID WC  . sodium chloride flush  10-40 mL Intracatheter Q12H  . sodium chloride flush  3 mL Intravenous Q12H  . warfarin  2.5 mg Oral q1800  . Warfarin -  Physician Dosing Inpatient   Does not apply q1800   Continuous Infusions: . sodium chloride    . sodium chloride    . lactated ringers Stopped (06/15/19 0552)  . vancomycin     PRN Meds:.sodium chloride, lip balm, metoprolol tartrate, morphine injection, ondansetron (ZOFRAN) IV, oxyCODONE, sodium chloride flush, sodium chloride flush, traMADol  General appearance: alert, cooperative and no distress Neurologic: intact Heart: regular rate and rhythm. Monitor shows consistent SR for >24 hours.  Lungs: Breath sounds are clear. Acceptable O2 sats on RA. Extremities: No edema, all well perfused. Wound: Right chest incision bruised but well approximated and healing. Right groin vascular access site bruised but no hematoma.   Lab Results: CBC: Recent Labs    06/16/19 0517 06/18/19 0255  WBC 18.1* 12.6*  HGB 10.0* 9.1*  HCT 29.3* 27.1*  PLT 119* 158   BMET:  Recent Labs    06/17/19 0310 06/18/19 0255  NA 133* 131*  K 3.8 3.3*  CL 92* 91*  CO2 31 29  GLUCOSE 107* 91  BUN 31* 24*  CREATININE 1.25* 1.04*  CALCIUM 8.2* 8.4*    CMET: Lab Results  Component Value Date   WBC 12.6 (H) 06/18/2019  HGB 9.1 (L) 06/18/2019   HCT 27.1 (L) 06/18/2019   PLT 158 06/18/2019   GLUCOSE 91 06/18/2019   CHOL 191 09/18/2018   TRIG 106.0 09/18/2018   HDL 53.70 09/18/2018   LDLDIRECT 128.8 01/17/2011   LDLCALC 116 (H) 09/18/2018   ALT 25 06/10/2019   AST 42 (H) 06/10/2019   NA 131 (L) 06/18/2019   K 3.3 (L) 06/18/2019   CL 91 (L) 06/18/2019   CREATININE 1.04 (H) 06/18/2019   BUN 24 (H) 06/18/2019   CO2 29 06/18/2019   TSH 2.40 09/18/2018   INR 1.5 (H) 06/18/2019   HGBA1C 5.4 06/10/2019      PT/INR:  Recent Labs    06/18/19 0255  LABPROT 18.0*  INR 1.5*   Radiology: No results found.   Assessment/Plan: S/P Procedure(s) (LRB): MINIMALLY INVASIVE TRICUSPID VALVE REPAIR USING EDWARDS MC3 T28 ANNULOPLASTY RING, INSERTION TEMPORARY TRANSVENOUS AV PACING LEAD (Right)  MINIMALLY INVASIVE MAZE PROCEDURE (N/A) TRANSESOPHAGEAL ECHOCARDIOGRAM (TEE) (N/A)  -POD6 minimally invasive tricuspid valve repair, MAZE, and LAA clip for rheumatic valve disease and chronic afib.  Hemodynamically stable. Excellent progress with mobility. Continue anticoagulation.  Coumadin currently dosed at 2.5mg /day, INR 1.5. OK to gradually ramp up dosing since it appears PPM will not be required.   -Post-op bradycardia--She has had  recovery of her SA node function and conduction system. Stable SR.  -Volume excess- good response to diuresis, Wt equivalent  to pre-op.  -Hypokalemia-supplementing with oral KCL  -Expected acute blood loss anemia- Hct acceptable, continue Fe supplement.  -Leukocytosis--trending toward normal, no clinical sns of infection.   -DVT PPX-on Lovenox.    Antony Odea , PA-C [phone] 06/18/2019 7:24 AM   I have seen and examined the patient and agree with the assessment and plan as outlined.  Maintaining NSR.  D/C pacing wires.  Awaiting bed on 4E for transfer.  Possible D/C home tomorrow.  Rexene Alberts, MD 06/18/2019

## 2019-06-19 LAB — BASIC METABOLIC PANEL
Anion gap: 8 (ref 5–15)
BUN: 16 mg/dL (ref 8–23)
CO2: 29 mmol/L (ref 22–32)
Calcium: 8.7 mg/dL — ABNORMAL LOW (ref 8.9–10.3)
Chloride: 96 mmol/L — ABNORMAL LOW (ref 98–111)
Creatinine, Ser: 0.97 mg/dL (ref 0.44–1.00)
GFR calc Af Amer: >60 mL/min (ref >60)
GFR calc non Af Amer: 55 mL/min — ABNORMAL LOW (ref >60)
Glucose, Bld: 109 mg/dL — ABNORMAL HIGH (ref 70–99)
Potassium: 5.5 mmol/L — ABNORMAL HIGH (ref 3.5–5.1)
Sodium: 133 mmol/L — ABNORMAL LOW (ref 135–145)

## 2019-06-19 LAB — PROTIME-INR
INR: 1.7 — ABNORMAL HIGH (ref 0.8–1.2)
Prothrombin Time: 19.9 seconds — ABNORMAL HIGH (ref 11.4–15.2)

## 2019-06-19 MED ORDER — FUROSEMIDE 40 MG PO TABS: 40.0000 mg | ORAL_TABLET | Freq: Every day | ORAL | 1 refills | Status: DC

## 2019-06-19 MED ORDER — FUROSEMIDE 40 MG PO TABS
40.0000 mg | ORAL_TABLET | Freq: Every day | ORAL | Status: DC
  Administered 2019-06-19: 40 mg via ORAL
  Filled 2019-06-19: qty 1

## 2019-06-19 MED ORDER — WARFARIN SODIUM 2.5 MG PO TABS: 5.0000 mg | ORAL_TABLET | Freq: Every day | ORAL | 11 refills | Status: DC

## 2019-06-19 MED ORDER — OXYCODONE-ACETAMINOPHEN 5-325 MG PO TABS: 1.0000 | ORAL_TABLET | ORAL | 0 refills | Status: DC | PRN

## 2019-06-19 MED ORDER — POTASSIUM CHLORIDE CRYS ER 20 MEQ PO TBCR: 20.0000 meq | EXTENDED_RELEASE_TABLET | Freq: Every day | ORAL | 1 refills | Status: DC

## 2019-06-19 MED ORDER — POTASSIUM CHLORIDE CRYS ER 20 MEQ PO TBCR: 20.0000 meq | EXTENDED_RELEASE_TABLET | Freq: Every day | ORAL | Status: DC

## 2019-06-19 MED ORDER — ASPIRIN 81 MG PO TBEC: 81.0000 mg | DELAYED_RELEASE_TABLET | Freq: Every day | ORAL | Status: DC

## 2019-06-19 NOTE — Progress Notes (Signed)
Discharge instructions reviewed with patient. All questions answered. Front wheel walker given to patient and adjusted for height. Patient states she is comfortable using walker. Patient taken to front entrance and put in family's van, seatbelt in place.

## 2019-06-19 NOTE — TOC Transition Note (Signed)
Transition of Care [facility]) - CM/SW Discharge Note   Patient Details  Name: AVIYANNA COLBAUGH MRN: [id number] Date of Birth: 05-24-1935  [facility] CM/SW Contact:  Midge Minium RN, BSN, NCM-BC, ACM-RN [phone] Phone Number: 06/19/2019, 9:28 AM   Clinical Narrative:    CM consult acknowledged for DME arrangement. CM spoke to the patient to discuss the POC. Patient states living at home with her spouse and being independent with her ADLs PTA, with no AD in use. PCP verified as: Dr. Garret Reddish. Patient requesting a RW prior to her transitioning home with DME preference provided with no preference. DME referral given to [place], [place] liaison; AVS updated. Patient denied any additional needs, with patient spouse to provide transportation home. No further needs from CM.    Final next level of care: Home/Self Care Barriers to Discharge: No Barriers Identified   Patient Goals and CMS Choice Patient states their goals for this hospitalization and ongoing recovery are:: "To return home" CMS Medicare.gov Compare Post Acute Care list provided to:: Patient Choice offered to / list presented to : Patient   Discharge Plan and Services            DME Arranged: Walker rolling DME Agency: [employer] Date DME Agency Contacted: 06/19/19 Time DME Agency Contacted: [phone] Representative spoke with at DME Agency: [place] ([place] liaison) [place] Arranged: NA [place] Agency: NA        Social Determinants of Health ([place]) Interventions     Readmission Risk Interventions No flowsheet data found.

## 2019-06-19 NOTE — Progress Notes (Addendum)
7 Days Post-Op Procedure(s) (LRB): MINIMALLY INVASIVE TRICUSPID VALVE REPAIR USING EDWARDS MC3 T28 ANNULOPLASTY RING, INSERTION TEMPORARY TRANSVENOUS AV PACING LEAD (Right) MINIMALLY INVASIVE MAZE PROCEDURE (N/A) TRANSESOPHAGEAL ECHOCARDIOGRAM (TEE) (N/A) Subjective: Feels much better today.  Walked in hall this AM without shortness of breath. Had a BM.  Objective: Vital signs in last 24 hours: Temp:  [98 F (36.7 C)-98.8 F (37.1 C)] 98 F (36.7 C) (06/24 0400) Pulse Rate:  [59-75] 70 (06/24 0700) Cardiac Rhythm: Normal sinus rhythm (06/24 0400) Resp:  [13-22] 14 (06/24 0700) BP: (94-120)/(47-91) 113/67 (06/24 0700) SpO2:  [95 %-100 %] 96 % (06/24 0700) Weight:  [76.5 kg] 76.5 kg (06/24 0500)  Intake/Output from previous day: 06/23 0701 - 06/24 0700 In: 120 [P.O.:120] Out: -  Intake/Output this shift: No intake/output data recorded.  General appearance: alert, cooperative and no distress Neurologic: intact Heart: regular rate and rhythm. Monitor shows consistent SR for >48 hours.  Lungs: Breath sounds are clear. Acceptable O2 sats on RA. Extremities: No edema, all well perfused. Wound: Right chest incision bruised but well approximated and healing. Right groin vascular access site bruised but no hematoma.    Lab Results: Recent Labs    06/18/19 0255  WBC 12.6*  HGB 9.1*  HCT 27.1*  PLT 158   BMET:  Recent Labs    06/18/19 0255 06/19/19 0306  NA 131* 133*  K 3.3* 5.5*  CL 91* 96*  CO2 29 29  GLUCOSE 91 109*  BUN 24* 16  CREATININE 1.04* 0.97  CALCIUM 8.4* 8.7*    PT/INR:  Recent Labs    06/19/19 0306  LABPROT 19.9*  INR 1.7*   ABG    Component Value Date/Time   PHART 7.307 (L) 06/13/2019 0118   HCO3 21.0 06/13/2019 0118   TCO2 22 06/13/2019 0118   ACIDBASEDEF 5.0 (H) 06/13/2019 0118   O2SAT 59.6 06/13/2019 0405   CBG (last 3)  No results for input(s): GLUCAP in the last 72 hours.  Assessment/Plan: S/P Procedure(s) (LRB): MINIMALLY  INVASIVE TRICUSPID VALVE REPAIR USING EDWARDS MC3 T28 ANNULOPLASTY RING, INSERTION TEMPORARY TRANSVENOUS AV PACING LEAD (Right) MINIMALLY INVASIVE MAZE PROCEDURE (N/A) TRANSESOPHAGEAL ECHOCARDIOGRAM (TEE) (N/A)   -POD7 minimally invasive tricuspid valve repair, MAZE, and LAA clip for rheumatic valve disease and chronic afib.  Hemodynamically stable. Excellent progress with mobility.  INR 1.7.  Plan to discharge today.   -Post-op bradycardia--She has had  recovery of her SA node function and conduction system. Stable SR.  -Volume excess- good response to diuresis, Wt equivalent  to pre-op.  -Hypokalemia-resolved  -Expected acute blood loss anemia- Hct acceptable, continue Fe supplement.    LOS: 7 days    Antony Odea, PA-C [phone] 06/19/2019   I have seen and examined the patient and agree with the assessment and plan as outlined.  D/C home today.  Instructions given.  Rexene Alberts, MD 06/19/2019 8:26 AM

## 2019-06-19 NOTE — Progress Notes (Signed)
CARDIAC REHAB PHASE I   Pt has ambulated in hallway this am, states she is excited to be able to go home today. Pt instructed to shower and monitor incisions daily. Encouraged continued IS use and walks. Reviewed restrictions and exercise guidelines. Will refer to CRP II [place], pt agreeable to Virtual Cardiac Rehab.    Pt is interested in participating in Virtual Cardiac Rehab. Pt advised that Virtual Cardiac Rehab is provided at no cost to the patient.  Checklist:  1. Pt has smart device  ie smartphone and/or ipad for downloading an app  Yes 2. Reliable internet/wifi service    Yes 3. Understands how to use their smartphone and navigate within an app.  Yes   Reviewed with pt the scheduling process for virtual cardiac rehab.  Pt verbalized understanding.   [id number] Rufina Falco, RN BSN 06/19/2019 9:13 AM

## 2019-06-21 ENCOUNTER — Telehealth (HOSPITAL_COMMUNITY): Payer: Self-pay

## 2019-06-21 NOTE — Telephone Encounter (Signed)
Referral signed and received from Dr. [name] for this pt to participate in Cardiac Rehab. Due to the departmental closure based on national recommendations for Covid-19 we are unable to provide group exercise at this time. Pt is aware of the closure and the option to participate in virtual cardiac rehab  at no cost to the patient from phase I rehab staff. Option also for hybrid cardiac rehab for in facility visits/session.  Insurance benefits and eligibility determined  will be shared with the patient for any in facility visits/sessions. Follow up appt on 06/26/2019

## 2019-06-24 ENCOUNTER — Telehealth: Payer: Self-pay

## 2019-06-24 ENCOUNTER — Other Ambulatory Visit: Payer: Self-pay

## 2019-06-24 ENCOUNTER — Ambulatory Visit (INDEPENDENT_AMBULATORY_CARE_PROVIDER_SITE_OTHER): Payer: Medicare Other | Admitting: *Deleted

## 2019-06-24 DIAGNOSIS — Z7901 Long term (current) use of anticoagulants: Secondary | ICD-10-CM

## 2019-06-24 DIAGNOSIS — I4819 Other persistent atrial fibrillation: Secondary | ICD-10-CM | POA: Diagnosis not present

## 2019-06-24 LAB — POCT INR: INR: 3.2 — AB (ref 2.0–3.0)

## 2019-06-24 MED ORDER — POTASSIUM CHLORIDE CRYS ER 20 MEQ PO TBCR: EXTENDED_RELEASE_TABLET | ORAL | 1 refills | Status: DC

## 2019-06-24 NOTE — Patient Instructions (Signed)
Description   Skip today's dose, then continue with warfarin 5 mg Tuesdays and Saturdays, 2.5 mg all other days. Call with any questions, medication changes or bleeding issues [facility] [phone].

## 2019-06-24 NOTE — Telephone Encounter (Signed)
Patient's daughter contacted the office to let us know that she got choked on her potassium pill because it was so large.  Daughter had to do the heimlich.  Requesting to have the medication changed from liquid form instead.  Contacted patient's pharmacy with the change.  She is s/p Mini TV Repair 06/17/2019 with Dr. Roxy Manns.  Dr. Roxy Manns is aware.

## 2019-06-24 NOTE — Telephone Encounter (Signed)
-----   Message from Rexene Alberts, MD sent at 06/24/2019 11:11 AM EDT ----- Regarding: RE: Medication change She's not on any medications which might cause tremors ----- Message ----- From: Donnella Sham, RN Sent: 06/24/2019  10:52 AM EDT To: Rexene Alberts, MD Subject: Medication change                              Hey,  I just wanted to let you know I changed her Potassium medication from pill to liquid form.  Her daughter stated that she choked on it the other day to the point where she was given the heimlich.  Also, patient stated that her benign tremors have gotten worse since discharge.  She was not sure if a medication she was taking possibly is making them worse?  I advised to contact her PCP regarding this but, I would also make you aware.  Thanks,  Caryl Pina

## 2019-06-25 ENCOUNTER — Telehealth: Payer: Self-pay | Admitting: Adult Health

## 2019-06-25 NOTE — Progress Notes (Signed)
Cardiology Office Note   Date:  06/26/2019   ID:  Adriana Hendricks, DOB 1935-03-26, MRN [id number]  PCP:  Marin Olp, MD  Cardiologist: Dr. [name] No chief complaint on file.    History of Present Illness: Adriana Hendricks is a 84 y.o. female who presents for TOC follow-up after admission for tricuspid valve insufficiency, mitral valve insufficiency, with history of persistent atrial fibrillation and hypertension.  The patient had a minimally invasive tricuspid valve repair and maze procedure, on 06/12/2019, which was completed by Dr. Roxy Manns with complex valvuloplasty including autologous pericardial patch augmentation of anterior and posterior leaflets, Edwards MC-3 Ring annuloplasty (size 28 mm, model #4900, serial #[id number]).  She also had clipping of the atrial appendage with complete bilateral atrial lesion set using cryotherapy and bipolar radiofrequency ablation-Maze procedure.  Postoperatively she did have some transient bradycardia but has had recovery of her SA node function and conduction system and was discharged in normal sinus rhythm.  She did have some volume overload postoperatively and was given IV diuresis.  Some hypokalemia was noted which was repleted.  She was discharged on 06/19/2019 and was stable.  She presents to the clinic today, with her daughter Investment banker, corporate), and states that she has had increased shortness of breath since her hospital discharge.  She has been able to maintain her physical activity.  She also notes that her tremor in bilateral upper extremities has gotten much worse over the last week.  She states that she is unable to hold a cup of water without spilling it.  She is also noticed that the tremor is less prominent in the evening.  Her incisions and chest tube sites appear to be healing well, with no erythema or drainage.  She states that she does have some right scapular discomfort.  She has full range of motion and full strength in her bilateral upper extremities.   She denies chest pain, lower extremity edema, palpitations, presyncope, syncope, melena, hematuria, hemoptysis, orthopnea and PND.  Past Medical History:  Diagnosis Date  . Achilles tendon rupture   . Arthritis of hand, right   . Carpal tunnel syndrome 09/04/2013  . Collagenous colitis 2005  . Colon polyps 2010   Tubular adenoma and hyperplastic  . Dental infection 10/25/2014  . Diverticulosis   . Esophageal stenosis   . Essential tremor   . GERD (gastroesophageal reflux disease)    hx hiatal hernia, hx esophagitis, hx stricture  . Heart murmur   . Hiatal hernia   . Hx: UTI (urinary tract infection)   . Hypertension   . Lower extremity neuropathy 08/23/2013   On B12 therapy with numbness in the feet bilaterally no evidence of diabetes   . Ocular migraine    jagged vision, a few per month  . OSA (obstructive sleep apnea) 12/21/2016   on CPAP  . Peripheral neuropathy    treated by Dr Posey Pronto (07/2015)  . Persistent atrial fibrillation    Rate control with beta-blocker and anticoagulated with warfarin  . S/P Maze operation for atrial fibrillation 06/12/2019   Complete bilateral atrial lesion set using cryothermy and bipolar radiofrequency ablation with clipping of LA appendage via right mini thoracotomy approach  . S/P minimally invasive tricuspid valve repair 06/12/2019   Complex valvuloplasty including autologous pericardial patch augmentation of anterior and posterior leaflets with 28 mm Edwards mc3 ring annuloplasty via right mini thoracotomy approach  . Severe tricuspid regurgitation 07/2018   Noted Aug 2019 during a fib workup. Severe LAE  as well  . Sinus node dysfunction [facility])     Past Surgical History:  Procedure Laterality Date  . 48 hr Holter Monitor  07/2018   Persistent Afib (rate 33 - 124 bpm)  . APPENDECTOMY    . CARDIAC CATHETERIZATION    . CARDIOVERSION N/A 10/02/2018   Procedure: CARDIOVERSION;  Surgeon: Acie Fredrickson, Wonda Cheng, MD;  Location: [facility] ENDOSCOPY;  Service:  Cardiovascular;  Laterality: N/A;  . CARPAL TUNNEL RELEASE    . CATARACT EXTRACTION    . EXCISION MORTON'S NEUROMA     Right foot  . EYE SURGERY     Cataracts with implants-bilateral  . INTRAOCULAR LENS IMPLANT, SECONDARY    . MINIMALLY INVASIVE MAZE PROCEDURE N/A 06/12/2019   Procedure: MINIMALLY INVASIVE MAZE PROCEDURE;  Surgeon: Rexene Alberts, MD;  Location: [place];  Service: Open Heart Surgery;  Laterality: N/A;  . MINIMALLY INVASIVE TRICUSPID VALVE REPAIR Right 06/12/2019   Procedure: MINIMALLY INVASIVE TRICUSPID VALVE REPAIR USING EDWARDS MC3 T28 ANNULOPLASTY RING, INSERTION TEMPORARY TRANSVENOUS AV PACING LEAD;  Surgeon: Rexene Alberts, MD;  Location: [place];  Service: Open Heart Surgery;  Laterality: Right;  . Moles removed    . MOUTH SURGERY     (For Exostosis)  . NUCLEAR  07/2018   EF 71 %. LOW RISK - no ischemia or Infarct.   Marland Kitchen RIGHT/LEFT HEART CATH AND CORONARY ANGIOGRAPHY N/A 01/16/2019   Procedure: RIGHT/LEFT HEART CATH AND CORONARY ANGIOGRAPHY;  Surgeon: Leonie Man, MD;  Location: [place] CV LAB;; Angiographically normal coronary arteries.  Normal LVEDP. Upper Limit of Normal PAP~23 mmHg & PCWP 18 mmHg.  LVEDP of 7 mmHg. -With mean PAP of 23 mmHg there is a TPG suggesting a primary pulmonary etiology.  Marland Kitchen ROTATOR CUFF REPAIR    . TEE WITHOUT CARDIOVERSION N/A 01/16/2019   Procedure: TRANSESOPHAGEAL ECHOCARDIOGRAM (TEE);  Surgeon: Sueanne Margarita, MD;  Location: [facility] ENDOSCOPY;;  Severe TR due to poor coaptation of the leaflets from annular dilation.  Mild to moderate mitral vegetation with mildly restricted mobility of the posterior leaflet.  EF 55-60% with no R WMA.  Severe RA and LA dilation.  . TEE WITHOUT CARDIOVERSION N/A 06/12/2019   Procedure: TRANSESOPHAGEAL ECHOCARDIOGRAM (TEE);  Surgeon: Rexene Alberts, MD;  Location: [place];  Service: Open Heart Surgery;  Laterality: N/A;  . TONSILLECTOMY    . TRANSTHORACIC ECHOCARDIOGRAM  07/2018   Normal LV Fxn (EF 60-65%)  no RWMA.  Severe LA dilation & Severe TR.     Current Outpatient Medications  Medication Sig Dispense Refill  . aspirin EC 81 MG EC tablet Take 1 tablet (81 mg total) by mouth daily.    . B Complex Vitamins (B COMPLEX 100 PO) Take 1 tablet by mouth daily.     . calcium carbonate (TUMS - DOSED IN MG ELEMENTAL CALCIUM) 500 MG chewable tablet Chew 1 tablet by mouth daily.     . cholecalciferol (VITAMIN D3) 25 MCG (1000 UT) tablet Take 1,000 Units by mouth daily.    . famotidine (PEPCID) 20 MG tablet Take 20 mg by mouth at bedtime.    . furosemide (LASIX) 40 MG tablet Take 1 tablet (40 mg total) by mouth daily for 30 days. 30 tablet 1  . Multiple Vitamins-Minerals (PRESERVISION AREDS 2 PO) Take 2 tablets by mouth 2 (two) times a day.    . potassium chloride SA (K-DUR) 20 MEQ tablet PRESCRIPTION CHANGED FROM PILL TO LIQUID FORM.  Do not start until Friday 06/21/2019. 30  tablet 1  . warfarin (COUMADIN) 2.5 MG tablet Take 2 tablets (5 mg total) by mouth daily at 6 PM for 30 days. Take 2 tablets daily or as instructed by [facility]mad[facility] Clinic. 60 tablet 11   No current facility-administered medications for this visit.     Allergies:   Levofloxacin, Potassium chloride, Clarithromycin, Erythromycin base, Metronidazole, and Penicillins    Social History:  The patient  reports that she quit smoking about 51 years ago. Her smoking use included cigarettes. She started smoking about 64 years ago. She has a 26.00 pack-year smoking history. She has never used smokeless tobacco. She reports current alcohol use of about 2.0 standard drinks of alcohol per week. She reports that she does not use drugs.   Family History:  The patient's family history includes Barrett's esophagus in her son; Heart failure in her father; Other in her mother; Rectal cancer in her father.    ROS: All other systems are reviewed and negative. Unless otherwise mentioned in H&P    PHYSICAL EXAM: VS:  BP 126/72 (BP Location: Left Arm,  Patient Position: Sitting, Cuff Size: Normal)   Pulse 97   Temp (!) 97.3 F (36.3 C)   Ht 5\' 5"  (1.651 m)   Wt 164 lb (74.4 kg)   LMP  (LMP Unknown)   SpO2 96%   BMI 27.29 kg/m  , BMI Body mass index is 27.29 kg/m. GEN: Well nourished, well developed, in no acute distress HEENT: normal Neck: no JVD, carotid bruits, or masses Cardiac: Sinus rhythm with occasional PVCs 97 bpm; no murmurs, rubs, or gallops,no edema  Respiratory:  Clear to auscultation bilaterally, normal work of breathing GI: soft, nontender, nondistended, + BS MS: no deformity or atrophy,++ right scapular discomfort Skin: warm and dry, no rash Neuro:  Strength and sensation are intact,+++ essential tremor bilateral upper extremities Psych: euthymic mood, full affect   EKG:  EKG is ordered today. The ekg ordered today demonstrates sinus rhythm with occasional and consecutive premature ventricular complexes 97 bpm.   Recent Labs: 09/18/2018: TSH 2.40 06/10/2019: ALT 25 06/16/2019: Magnesium 2.0 06/18/2019: Hemoglobin 9.1; Platelets 158 06/19/2019: BUN 16; Creatinine, Ser 0.97; Potassium 5.5; Sodium 133    Lipid Panel    Component Value Date/Time   CHOL 191 09/18/2018 1401   TRIG 106.0 09/18/2018 1401   HDL 53.70 09/18/2018 1401   CHOLHDL 4 09/18/2018 1401   VLDL 21.2 09/18/2018 1401   LDLCALC 116 (H) 09/18/2018 1401   LDLDIRECT 128.8 01/17/2011 0941      Wt Readings from Last 3 Encounters:  06/26/19 164 lb (74.4 kg)  06/19/19 168 lb 10.4 oz (76.5 kg)  06/10/19 168 lb 9.6 oz (76.5 kg)      Other studies Reviewed: Cardiac Cath 01/16/2019  Angiographically normal coronary arteries  LV end diastolic pressure is normal.  No evidence pulmonary pretension   SUMMARY  Angiographically minimal CAD.  Strikingly normal RIGHT HEART CATH PRESSURES with only high normal mean PA pressure of 23 mmHg in the setting of PCWP pressure of 18 mmHg.  Basically euvolemic with LVEDP of 7 mmHg. -With mean PA pressure  of 23 mmHg there is a transpulmonary gradient suggesting a primary pulmonary etiology.   RECOMMENDATIONS  We will defer to Dr. Roxy Manns reference potential management of tricuspid valve insufficiency based on TEE with relative normal right heart cath numbers.  Would recommend treating potential pulmonary etiology.  Otherwise would continue current cardiac medications  Echocardiogram 06/12/2019 POST-OP IMPRESSIONS - Left Ventricle: The left ventricle  is unchanged from pre-bypass. - Aorta: The aorta appears unchanged from pre-bypass. - Aortic Valve: The aortic valve appears unchanged from pre-bypass. - Mitral Valve: The mitral valve appears unchanged from pre-bypass. - Tricuspid Valve: There is trace regurgitation.Ring in place. - Interatrial Septum: The interatrial septum appears unchanged from pre-bypass. - Pericardium: The pericardium appears unchanged from pre-bypass.   ASSESSMENT AND PLAN:  1.  Dyspnea on exertion/fatigue- has progressed since hospital discharge.  No bilateral lower extremity edema today. Stop Lasix Stop potassium. Order CBC Ordered BMP Order TSH Order cortisol Future echocardiogram per cardiothoracic surgery. 2.  Tremor-bilateral upper extremities has progressed over the last week. Follow-up appointment with Dr. Posey Pronto (patient already established at [facility] neurology) Order CBC Ordered BMP Order TSH Order cortisol  3.  Persistent atrial fibrillation-sinus rhythm today Continue warfarin-goal INR 2-3 Managed by Coumadin clinic  4.  Essential hypertension-well controlled today Patient asked to log blood pressures daily Patient previously took nadolol 40 mg tablet daily and tolerated this well. Continue to monitor Increase physical activity as tolerated  Current medicines are reviewed at length with the patient today.    Labs/ tests ordered today include: CBC, BMP, TSH, cortisol, schedule appointment with Dr. Gwynne Edinger neurology.  Jossie Ng.  Idella Lamontagne NP-C    06/26/2019 11:40 AM    [place] [address] Office [phone] Fax [phone]

## 2019-06-25 NOTE — Telephone Encounter (Signed)
° ° °  COVID-19 Pre-Screening Questions:   In the past 7 to 10 days have you had a cough,  shortness of breath, headache, congestion, fever (100 or greater) body aches, chills, sore throat, or sudden loss of taste or sense of smell? no  Have you been around anyone with known Covid 19. no  Have you been around anyone who is awaiting Covid 19 test results in the past 7 to 10 days? no  Have you been around anyone who has been exposed to Covid 19, or has mentioned symptoms of Covid 19 within the past 7 to 10 days? no  If you have any concerns/questions about symptoms patients report during screening (either on the phone or at threshold). Contact the provider seeing the patient or DOD for further guidance.  If neither are available contact a member of the leadership team.  I called to confirm her appt with Jory Sims on 06-26-19.  Pt said she will need her daughter to come with her.

## 2019-06-26 ENCOUNTER — Ambulatory Visit (INDEPENDENT_AMBULATORY_CARE_PROVIDER_SITE_OTHER): Payer: Medicare Other | Admitting: Adult Health

## 2019-06-26 ENCOUNTER — Encounter: Payer: Self-pay | Admitting: Adult Health

## 2019-06-26 ENCOUNTER — Other Ambulatory Visit: Payer: Self-pay

## 2019-06-26 VITALS — BP 126/72 | HR 97 | Temp 97.3°F | Ht 65.0 in | Wt 164.0 lb

## 2019-06-26 DIAGNOSIS — Z79899 Other long term (current) drug therapy: Secondary | ICD-10-CM

## 2019-06-26 DIAGNOSIS — R5383 Other fatigue: Secondary | ICD-10-CM | POA: Diagnosis not present

## 2019-06-26 DIAGNOSIS — R251 Tremor, unspecified: Secondary | ICD-10-CM

## 2019-06-26 DIAGNOSIS — I4819 Other persistent atrial fibrillation: Secondary | ICD-10-CM

## 2019-06-26 DIAGNOSIS — R0609 Other forms of dyspnea: Secondary | ICD-10-CM

## 2019-06-26 DIAGNOSIS — R0602 Shortness of breath: Secondary | ICD-10-CM | POA: Diagnosis not present

## 2019-06-26 DIAGNOSIS — I2510 Atherosclerotic heart disease of native coronary artery without angina pectoris: Secondary | ICD-10-CM

## 2019-06-26 NOTE — Patient Instructions (Addendum)
Medication Instructions:  Stop taking lasix and stop taking potassium.   If you need a refill on your cardiac medications before your next appointment, please call your pharmacy.  Labwork: BMP, CBC, TSH, Cortisol HERE IN OUR OFFICE AT [facility]   You will NOT need to fast   Take the provided lab slips with you to the lab for your blood draw.   When you have your labs (blood work) drawn today and your tests are completely normal, you will receive your results only by MyChart Message (if you have MyChart) -OR-  A paper copy in the mail.  If you have any lab test that is abnormal or we need to change your treatment, we will call you to review these results.  Testing/Procedures: None Ordered   Follow-Up: . Your physician recommends that you schedule a follow-up appointment in: 3 Months with Dr [name] . Your physician recommends that you schedule a follow-up appointment in: Dr Posey Pronto Wednesday September 16th 8:45 am   At [facility], you and your health needs are our priority.  As part of our continuing mission to provide you with exceptional heart care, we have created designated Provider Care Teams.  These Care Teams include your primary Cardiologist (physician) and Advanced Practice Providers (APPs -  Physician Assistants and Nurse Practitioners) who all work together to provide you with the care you need, when you need it.  Thank you for choosing [facility] at [facility]!!

## 2019-06-27 ENCOUNTER — Ambulatory Visit (INDEPENDENT_AMBULATORY_CARE_PROVIDER_SITE_OTHER): Payer: Medicare Other

## 2019-06-27 DIAGNOSIS — Z7901 Long term (current) use of anticoagulants: Secondary | ICD-10-CM

## 2019-06-27 DIAGNOSIS — I4819 Other persistent atrial fibrillation: Secondary | ICD-10-CM

## 2019-06-27 LAB — BASIC METABOLIC PANEL
BUN/Creatinine Ratio: 18 (ref 12–28)
BUN: 15 mg/dL (ref 8–27)
CO2: 23 mmol/L (ref 20–29)
Calcium: 9.4 mg/dL (ref 8.7–10.3)
Chloride: 96 mmol/L (ref 96–106)
Creatinine, Ser: 0.84 mg/dL (ref 0.57–1.00)
GFR calc Af Amer: 75 mL/min/{1.73_m2} (ref >59)
GFR calc non Af Amer: 65 mL/min/{1.73_m2} (ref >59)
Glucose: 90 mg/dL (ref 65–99)
Potassium: 4.1 mmol/L (ref 3.5–5.2)
Sodium: 138 mmol/L (ref 134–144)

## 2019-06-27 LAB — POCT INR: INR: 2.2 (ref 2.0–3.0)

## 2019-06-27 LAB — CBC
Hematocrit: 27.6 % — ABNORMAL LOW (ref 34.0–46.6)
Hemoglobin: 9.5 g/dL — ABNORMAL LOW (ref 11.1–15.9)
MCH: 32.2 pg (ref 26.6–33.0)
MCHC: 34.4 g/dL (ref 31.5–35.7)
MCV: 94 fL (ref 79–97)
Platelets: 389 10*3/uL (ref 150–450)
RBC: 2.95 x10E6/uL — ABNORMAL LOW (ref 3.77–5.28)
RDW: 12.1 % (ref 11.7–15.4)
WBC: 12.3 10*3/uL — ABNORMAL HIGH (ref 3.4–10.8)

## 2019-06-27 LAB — TSH: TSH: 3.44 u[IU]/mL (ref 0.450–4.500)

## 2019-06-27 LAB — CORTISOL: Cortisol: 11.9 ug/dL

## 2019-06-27 NOTE — Patient Instructions (Signed)
Description   Continue on same dosage 5 mg Tuesdays and Saturdays, 2.5 mg all other days.  Recheck in 2 weeks.   Call with any questions, medication changes or bleeding issues [facility] [phone].

## 2019-06-28 ENCOUNTER — Ambulatory Visit (HOSPITAL_COMMUNITY)
Admission: EM | Admit: 2019-06-28 | Discharge: 2019-06-28 | Disposition: A | Discharge status: Home / Self Care | Payer: Medicare Other | Attending: Family Medicine | Admitting: Family Medicine

## 2019-06-28 ENCOUNTER — Ambulatory Visit: Payer: Self-pay

## 2019-06-28 ENCOUNTER — Encounter (HOSPITAL_COMMUNITY): Payer: Self-pay | Admitting: Emergency Medicine

## 2019-06-28 DIAGNOSIS — N3090 Cystitis, unspecified without hematuria: Secondary | ICD-10-CM | POA: Diagnosis not present

## 2019-06-28 DIAGNOSIS — N390 Urinary tract infection, site not specified: Secondary | ICD-10-CM

## 2019-06-28 LAB — POCT URINALYSIS DIP (DEVICE)
Bilirubin Urine: NEGATIVE
Glucose, UA: NEGATIVE mg/dL
Ketones, ur: NEGATIVE mg/dL
Nitrite: NEGATIVE
Protein, ur: 100 mg/dL — AB
Specific Gravity, Urine: 1.025 (ref 1.005–1.030)
Urobilinogen, UA: 0.2 mg/dL (ref 0.0–1.0)
pH: 6.5 (ref 5.0–8.0)

## 2019-06-28 MED ORDER — CEPHALEXIN 500 MG PO CAPS: 500.0000 mg | ORAL_CAPSULE | Freq: Two times a day (BID) | ORAL | 0 refills | Status: DC

## 2019-06-28 NOTE — ED Triage Notes (Signed)
Pt states yesterday she noticed cloudy urine. Pt also states she also has some mild pain and tenderness in her RLQ. Hx of appendectomy.

## 2019-06-28 NOTE — Telephone Encounter (Signed)
Pt. Called to report onset of "cloudy urine" this morning.  Reported she has slight burning with urination.  Denied frequency of urination, or foul odor of urine.  Denied fever.  Reported some chills.  Also, reported she has a mild discomfort in right mid-abdomen, about 3 inches to the right of her navel; rated at 1/10.  Due to recent heart surgery with Tricuspid valve repair, advised she will need to be eval. for poss. UTI at [facility].  (PCP office is not opened)  Pt. Verb. Understanding.  Agreed with plan.   Reason for Disposition . Side (flank) or lower back pain present    C/o right sided, mid-abdominal pain and cloudy urine.  Denied flank or back pain.  Pt. is s/p open heart surgery 10 days ago.  Answer Assessment - Initial Assessment Questions 1. SYMPTOM: "What's the main symptom you're concerned about?" (e.g., frequency, incontinence)     Cloudy urine;  2. ONSET: "When did the  Symptoms start?"     This morning  3. PAIN: "Is there any pain?" If so, ask: "How bad is it?" (Scale: 1-10; mild, moderate, severe)     Some burning with urination  4. CAUSE: "What do you think is causing the symptoms?"     Unknown; s/p heart surgery 10 days ago  5. OTHER SYMPTOMS: "Do you have any other symptoms?" (e.g., fever, flank pain, blood in urine, pain with urination)     Abdominal discomfort at 1/10; cloudy urine; no odor  6. PREGNANCY: "Is there any chance you are pregnant?" "When was your last menstrual period?"    N/a  Protocols used: URINARY [facility]

## 2019-06-28 NOTE — ED Provider Notes (Signed)
[place]    ASSESSMENT & PLAN:  1. Cystitis    Meds ordered this encounter  Medications  . cephALEXin (KEFLEX) 500 MG capsule    Sig: Take 1 capsule (500 mg total) by mouth 2 (two) times daily.    Dispense:  10 capsule    Refill:  0   No s/s of pyelonephritis. Discussed. Urine culture sent. Will notify patient when results available. Will follow up with her PCP or here if not showing improvement over the next 48 hours, sooner if needed.  Outlined signs and symptoms indicating need for more acute intervention. Patient verbalized understanding. After Visit Summary given.  SUBJECTIVE:  Adriana Hendricks is a 84 y.o. female who complains of urinary frequency and urgency along with "dark and cloudy" urine. First noticed yesterday. Without associated flank pain, fever, chills, genitourinary discharge or gross bleeding. Normal PO intake without n/v. Without specific abdominal pain. No self treatment reported. Ambulatory without difficulty.  LMP: No LMP recorded (lmp unknown). Patient is postmenopausal.  ROS: As in HPI.  OBJECTIVE:  Vitals:   06/28/19 1058  BP: 138/76  Pulse: 100  Resp: 18  Temp: 98.7 F (37.1 C)  SpO2: 94%   General appearance: alert; no distress HENT: oropharynx: moist Lungs: unlabored respirations Abdomen: soft, non-tender; bowel sounds normal; no guarding or rebound tenderness Extremities: no edema Skin: warm and dry Psychological: alert and cooperative; normal mood and affect  Labs Reviewed  POCT URINALYSIS DIP (DEVICE) - Abnormal; Notable for the following components:      Result Value   Hgb urine dipstick LARGE (*)    Protein, ur 100 (*)    Leukocytes,Ua LARGE (*)    All other components within normal limits  URINE CULTURE    Allergies  Allergen Reactions  . Levofloxacin Other (See Comments)    Developed tendon pain. (Has a history of a Achilles tendon tear)  . Potassium Chloride Hives  . Clarithromycin Nausea Only  .  Erythromycin Base Nausea And Vomiting  . Metronidazole Rash  . Penicillins Rash    Did it involve swelling of the face/tongue/throat, SOB, or low BP? No Did it involve sudden or severe rash/hives, skin peeling, or any reaction on the inside of your mouth or nose? No Did you need to seek medical attention at a hospital or doctor's office? No When did it last happen? as a child If all above answers are "NO", may proceed with cephalosporin use.     Past Medical History:  Diagnosis Date  . Achilles tendon rupture   . Arthritis of hand, right   . Carpal tunnel syndrome 09/04/2013  . Collagenous colitis 2005  . Colon polyps 2010   Tubular adenoma and hyperplastic  . Dental infection 10/25/2014  . Diverticulosis   . Esophageal stenosis   . Essential tremor   . GERD (gastroesophageal reflux disease)    hx hiatal hernia, hx esophagitis, hx stricture  . Heart murmur   . Hiatal hernia   . Hx: UTI (urinary tract infection)   . Hypertension   . Lower extremity neuropathy 08/23/2013   On B12 therapy with numbness in the feet bilaterally no evidence of diabetes   . Ocular migraine    jagged vision, a few per month  . OSA (obstructive sleep apnea) 12/21/2016   on CPAP  . Peripheral neuropathy    treated by Dr Posey Pronto (07/2015)  . Persistent atrial fibrillation    Rate control with beta-blocker and anticoagulated with warfarin  . S/P  Maze operation for atrial fibrillation 06/12/2019   Complete bilateral atrial lesion set using cryothermy and bipolar radiofrequency ablation with clipping of LA appendage via right mini thoracotomy approach  . S/P minimally invasive tricuspid valve repair 06/12/2019   Complex valvuloplasty including autologous pericardial patch augmentation of anterior and posterior leaflets with 28 mm Edwards mc3 ring annuloplasty via right mini thoracotomy approach  . Severe tricuspid regurgitation 07/2018   Noted Aug 2019 during a fib workup. Severe LAE as well  . Sinus node  dysfunction (HCC)    Social History   Socioeconomic History  . Marital status: Married    Spouse name: Not on file  . Number of children: 2  . Years of education: Not on file  . Highest education level: Not on file  Occupational History  . Occupation: Retired  Scientific laboratory technician  . Financial resource strain: Not on file  . Food insecurity    Worry: Not on file    Inability: Not on file  . Transportation needs    Medical: Not on file    Non-medical: Not on file  Tobacco Use  . Smoking status: Former Smoker    Packs/day: 2.00    Years: 13.00    Pack years: 26.00    Types: Cigarettes    Start date: 12/26/1954    Quit date: 03/26/1968    Years since quitting: 51.2  . Smokeless tobacco: Never Used  . Tobacco comment: Quit in 1969 - smokeed 2-3PPD , was 3 PPD at her heaviest for about 3 years.   Substance and Sexual Activity  . Alcohol use: Yes    Alcohol/week: 2.0 standard drinks    Types: 2 Glasses of wine per week    Comment: 2 glass of wine per day  . Drug use: No  . Sexual activity: Yes  Lifestyle  . Physical activity    Days per week: Not on file    Minutes per session: Not on file  . Stress: Not on file  Relationships  . Social Herbalist on phone: Not on file    Gets together: Not on file    Attends religious service: Not on file    Active member of club or organization: Not on file    Attends meetings of clubs or organizations: Not on file    Relationship status: Not on file  . Intimate partner violence    Fear of current or ex partner: Not on file    Emotionally abused: Not on file    Physically abused: Not on file    Forced sexual activity: Not on file  Other Topics Concern  . Not on file  Social History Narrative   Married. Husband is patient of Dr. Yong Channel.    Married 1958. 2 children. 4 grandkids (3 girls, 1 boy).    Retired from [employer].   Hobbies: genealogy and DNA   Family History  Problem Relation Age of  Onset  . Rectal cancer Father        colostomy  . Heart failure Father        Died, 89  . Other Mother        Died, 47. old age.   . Barrett's esophagus Son   . Colon cancer Neg Hx   . Esophageal cancer Neg Hx   . Stomach cancer Neg Hx        Vanessa Kick, MD 06/28/19 [phone]

## 2019-06-28 NOTE — Discharge Instructions (Addendum)
We will call you with any significant results from your urine culture.

## 2019-07-01 ENCOUNTER — Telehealth (HOSPITAL_COMMUNITY): Payer: Self-pay | Admitting: Emergency Medicine

## 2019-07-01 ENCOUNTER — Telehealth: Payer: Self-pay | Admitting: Family Medicine

## 2019-07-01 LAB — URINE CULTURE: Culture: >=100000 — AB

## 2019-07-01 MED ORDER — FOSFOMYCIN TROMETHAMINE 3 G PO PACK: 3.0000 g | PACK | Freq: Once | ORAL | 0 refills | Status: AC

## 2019-07-01 NOTE — Telephone Encounter (Signed)
Patient yis calling for an appt to schedule a urinalysis Please advise Cb- [phone]

## 2019-07-01 NOTE — Telephone Encounter (Signed)
See note

## 2019-07-01 NOTE — Telephone Encounter (Signed)
Culture is back- dos not look like keflex covers both bacteria- have her call urgent care for follow up on urine culture and new antbiotic needed- if any issues let me know and I will review/potentially rx

## 2019-07-01 NOTE — Telephone Encounter (Signed)
Please place order so patient can be scheduled

## 2019-07-01 NOTE — Telephone Encounter (Signed)
I would have her wait 48 hours after the new antibiotic-then order urinalysis and urine culture under UTI

## 2019-07-01 NOTE — Telephone Encounter (Signed)
Pt called stating she needed an antibiotic change. Spoke with Madelynn Done, Dr. Meda Coffee, and pharmacy [facility]. Recommend single dose of fosfomycin, pt needs follow up with PCP for recheck. Pt agreeable to plan.

## 2019-07-01 NOTE — Telephone Encounter (Signed)
Pt notified of update and will call urgent care. Pt is concerned since she just had heart surgery and wants to know if this bacteria can cause some issues with her heart.  Please advise

## 2019-07-01 NOTE — Telephone Encounter (Signed)
Pt was seen at urgent care 06/28/19 and started on Cephalexin 500 mg BID for 5 days. Urine C&S ordered and resulted.

## 2019-07-01 NOTE — Telephone Encounter (Signed)
Pt seen at urgent care 06/28/2019 and dx with cystitis. They recommended f/u with PCP to recheck urine. OK for lab visit or would you prefer OV?

## 2019-07-01 NOTE — Telephone Encounter (Signed)
See other phone note as well- I doubt this will affect her heart- glad she followed up with urgent care and got new antibiotic

## 2019-07-02 NOTE — Telephone Encounter (Signed)
Labs ordered. Called pt and scheduled for Thursday morning lab visit.

## 2019-07-02 NOTE — Addendum Note (Signed)
Addended by: Jasper Loser on: 07/02/2019 08:51 AM   Modules accepted: Orders

## 2019-07-02 NOTE — Telephone Encounter (Signed)
Called pt and advised. Labs ordered. Lab visit scheduled for Thursday morning.

## 2019-07-03 ENCOUNTER — Other Ambulatory Visit: Payer: Self-pay | Admitting: Thoracic Surgery (Cardiothoracic Vascular Surgery)

## 2019-07-03 DIAGNOSIS — I071 Rheumatic tricuspid insufficiency: Secondary | ICD-10-CM

## 2019-07-04 ENCOUNTER — Other Ambulatory Visit (INDEPENDENT_AMBULATORY_CARE_PROVIDER_SITE_OTHER): Payer: Medicare Other

## 2019-07-04 ENCOUNTER — Telehealth: Payer: Self-pay

## 2019-07-04 ENCOUNTER — Telehealth (HOSPITAL_COMMUNITY): Payer: Self-pay

## 2019-07-04 ENCOUNTER — Other Ambulatory Visit: Payer: Self-pay

## 2019-07-04 DIAGNOSIS — N390 Urinary tract infection, site not specified: Secondary | ICD-10-CM

## 2019-07-04 LAB — URINALYSIS
Bilirubin Urine: NEGATIVE
Hgb urine dipstick: NEGATIVE
Ketones, ur: NEGATIVE
Leukocytes,Ua: NEGATIVE
Nitrite: NEGATIVE
Specific Gravity, Urine: 1.01 (ref 1.000–1.030)
Total Protein, Urine: NEGATIVE
Urine Glucose: NEGATIVE
Urobilinogen, UA: 0.2 (ref 0.0–1.0)
pH: 7 (ref 5.0–8.0)

## 2019-07-04 NOTE — Telephone Encounter (Signed)
Pt insurance is active and benefits verified through Medicare A/[employer]. Co-pay $0.00, DED $198.00/$198.00 met, out of pocket $0.00/$0.00 met, co-insurance 20%. No  pre-authorization required. Passport, 07/04/2019 @ 11:44AM, HKU#[id number]  2ndary insurance is active and benefits verified through [employer]. Co-pay $30.00, DED $0.00/$0.00 met, out of pocket $5,500.00/$125.40 met, co-insurance 0%. No pre-authorization required. Passport, 07/04/2019 @ 11:47AM, FQM#[id number]

## 2019-07-04 NOTE — Telephone Encounter (Signed)

## 2019-07-05 ENCOUNTER — Encounter (HOSPITAL_COMMUNITY): Payer: Self-pay

## 2019-07-05 ENCOUNTER — Telehealth: Payer: Self-pay | Admitting: Cardiology

## 2019-07-05 LAB — URINE CULTURE
MICRO NUMBER:: 650370
Result:: NO GROWTH
SPECIMEN QUALITY:: ADEQUATE

## 2019-07-05 NOTE — Progress Notes (Signed)
Pt was contacted to schedule for cardiac rehab orientation. A message was left and our call has not been returned. Pt will be mailed a letter today (7/10) with instructions on how to contact us. If pt has not contacted us by 7/24, their referral will be closed. 

## 2019-07-05 NOTE — Telephone Encounter (Signed)
Discussed with Adriana Hendricks, recommendations to be given to patient and daughter by phone.   Lasix can be taken today and Sunday, with potassium. Plan to come in for appt Monday or Tuesday with APP to review. May need lasix prn going forward.

## 2019-07-05 NOTE — Telephone Encounter (Signed)
Spoke with patients daughter and she was actually taking Lasix 40 mg daily prior. She did have to be taken off and was hoping for Aldactone secondary to not tolerating as well. Discussed further with Dr Margaretann Loveless and she would prefer Lasix 20 mg every other day given her lower blood pressure. If SBP less than 100 do not give Lasix. Advised daughter and scheduled appointment with Dr [name] on 07/11/19. Patient has visit Monday with Dr Roxy Manns. Advised daughter if no improvement and not bad enough to go to ED call back Monday, verbalized understanding.

## 2019-07-05 NOTE — Telephone Encounter (Signed)
Spoke with patients daughter and patient has had increased shortness of breath over the last couple of days. Last night woke her up. Lungs sound clear, O2 stat 95%, was 91% last night and this am. Shortness of breath worse with exertion, feels ok while just sitting. Weight stable (numbers listed). Lasix 20 mg daily d/c at 7/1 visit with Arnold Long DNP. Patient had decreased energy taking the potassium/Lasix. Originally starting Lasix caused her potassium to drop, unable to tolerate potassium pills had to change to liquid. She was feeling much better immediately after stopping potassium. Blood pressure runs 100-107/60s. Did have her check while on phone and blood pressure 133/77. Per daughter that is the first time in the week she has been there that high.  Will forward to Dr Margaretann Loveless DOD for review

## 2019-07-05 NOTE — Telephone Encounter (Signed)
°  Pt c/o swelling: STAT is pt has developed SOB within 24 hours  1) How much weight have you gained and in what time span? none  2) If swelling, where is the swelling located? Ankles (+1 at best for swelling)  3) Are you currently taking a fluid pill? no  4) Are you currently SOB? no  5) Do you have a log of your daily weights (if so, list)?   07/10 166,   07/09 166  07/08 165  07/07 167  07/06 167  07/05 164  6) Have you gained 3 pounds in a day or 5 pounds in a week? no  7) Have you traveled recently? No    Pt c/o Shortness Of Breath: STAT if SOB developed within the last 24 hours or pt is noticeably SOB on the phone  1. Are you currently SOB (can you hear that pt is SOB on the phone)? no  2. How long have you been experiencing SOB? Since ~2:30am 07/10  3. Are you SOB when sitting or when up moving around? Up and moving   4. Are you currently experiencing any other symptoms?  BP has been running a little low (109/71), but it was the same as when she was d/c from the hospital for her recent surgery.   Patient was in the office on 07/01 and saw Jory Sims. At this appointment, Curt Bears took the patient off of her Lasix. Daughter states that the fluid pill caused the patient's potassium levels to go too low, which required a potassium supplement.    Daughter wants to know if the patient needs to go back on a fluid pill, and if there is one that will not cause a significant reduction in potassium levels.  Daughter states that the SOB and swelling just started today, and she would like to get on top of the treatment.

## 2019-07-08 ENCOUNTER — Ambulatory Visit (INDEPENDENT_AMBULATORY_CARE_PROVIDER_SITE_OTHER): Payer: Self-pay | Admitting: Physician Assistant

## 2019-07-08 ENCOUNTER — Other Ambulatory Visit: Payer: Self-pay

## 2019-07-08 ENCOUNTER — Other Ambulatory Visit: Payer: Self-pay | Admitting: *Deleted

## 2019-07-08 ENCOUNTER — Encounter: Payer: Self-pay | Admitting: Physician Assistant

## 2019-07-08 ENCOUNTER — Ambulatory Visit
Admission: RE | Admit: 2019-07-08 | Discharge: 2019-07-08 | Disposition: A | Discharge status: Home / Self Care | Payer: Medicare Other | Source: Ambulatory Visit | Attending: Thoracic Surgery (Cardiothoracic Vascular Surgery) | Admitting: Thoracic Surgery (Cardiothoracic Vascular Surgery)

## 2019-07-08 VITALS — BP 142/86 | HR 124 | Temp 97.5°F | Resp 18 | Ht 65.0 in | Wt 164.0 lb

## 2019-07-08 DIAGNOSIS — Z9889 Other specified postprocedural states: Secondary | ICD-10-CM

## 2019-07-08 DIAGNOSIS — D649 Anemia, unspecified: Secondary | ICD-10-CM | POA: Diagnosis not present

## 2019-07-08 DIAGNOSIS — I071 Rheumatic tricuspid insufficiency: Secondary | ICD-10-CM

## 2019-07-08 DIAGNOSIS — R0602 Shortness of breath: Secondary | ICD-10-CM | POA: Diagnosis not present

## 2019-07-08 DIAGNOSIS — J90 Pleural effusion, not elsewhere classified: Secondary | ICD-10-CM

## 2019-07-08 DIAGNOSIS — N390 Urinary tract infection, site not specified: Secondary | ICD-10-CM

## 2019-07-08 DIAGNOSIS — I4819 Other persistent atrial fibrillation: Secondary | ICD-10-CM

## 2019-07-08 MED ORDER — METOPROLOL TARTRATE 25 MG PO TABS: 12.5000 mg | ORAL_TABLET | Freq: Two times a day (BID) | ORAL | 1 refills | Status: DC

## 2019-07-08 NOTE — Patient Instructions (Signed)
Will check BMET, CBC, UA and urine culture, INR Will need left thoracentesis for increasing left pleural effusion Likely in a flutter. Will start low dose Lopressor

## 2019-07-08 NOTE — Progress Notes (Signed)
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 Roxy Manns on 06/12/2019. Since hospital discharge the patient reports increasing shortness of breath with exertion. She spoke with the cardiology office and she was instructed to take Lasix  20 mg every other day and if SBP less than 100, not to take it. She was also taking a liquid potassium supplement. She is on Coumadin (TV repair) and her last PT and INR were drawn on 07/02 at was 2.2.  Current Outpatient Medications  Medication Sig Dispense Refill  . aspirin EC 81 MG EC tablet Take 1 tablet (81 mg total) by mouth daily.    . B Complex Vitamins (B COMPLEX 100 PO) Take 1 tablet by mouth daily.     . calcium carbonate (TUMS - DOSED IN MG ELEMENTAL CALCIUM) 500 MG chewable tablet Chew 1 tablet by mouth daily.     . cephALEXin (KEFLEX) 500 MG capsule Take 1 capsule (500 mg total) by mouth 2 (two) times daily. 10 capsule 0  . cholecalciferol (VITAMIN D3) 25 MCG (1000 UT) tablet Take 1,000 Units by mouth daily.    . famotidine (PEPCID) 20 MG tablet Take 20 mg by mouth at bedtime.    . furosemide (LASIX) 40 MG tablet Take 40 mg by mouth as directed. 1/2 tablet every other day    . Multiple Vitamins-Minerals (PRESERVISION AREDS 2 PO) Take 2 tablets by mouth 2 (two) times a day.    . potassium chloride 20 MEQ/15ML (10%) SOLN Take by mouth as directed. Take on days you take Furosemide (lasix)    . warfarin (COUMADIN) 2.5 MG tablet Take 2 tablets (5 mg total) by mouth daily at 6 PM for 30 days. Take 2 tablets daily or as instructed by [facility]. 60 tablet 11  Vital Signs: BP 142/86, HR 124, RR 18,  Oxygenation 95% on room air  Physical Exam : CV-RRR but tachycardic. On rhythm strip, probable a flutter Pulmonary-Clear on the right and very diminished left base Extremities LE edema Wounds-Clean and  dry.  Diagnostic Test: CLINICAL DATA:  Weakness and shortness of breath.  EXAM: CHEST - 2 VIEW  COMPARISON:  June 18, 2019  FINDINGS: Left atrial appendage clip and valvular prosthesis are noted.  Cardiomediastinal silhouette is mildly enlarged.  There is a large left pleural effusion and small right pleural effusion. Minimal pulmonary vascular congestion.  Osseous structures are without acute abnormality. Soft tissues are grossly normal.  IMPRESSION: 1. Large left pleural effusion and small right pleural effusion. 2. Minimal pulmonary vascular congestion.  Impression and Plan: I discussed with patient the findings on today's chest x ray. I have recommended a left thoracentesis. She will need to have an INR drawn, as last one on 07/03. I discussed with Dr. Roxy Manns regarding rhythm strip findings, and she appears to be in a flutter. Her systolic BP has been 841-660;[id number], it is elevated at today's exam. She will be started on Lopressor 12.5 mg bid. Also, she has had a low grade fever to 99 and once to 100.4. She has no sign of a wound infection. She had previously been treated for a UTI and culture showed E. Coli and P. Aeruginosa. Because of her allergies, she was given a one time dose of Fosfomycin. We will recheck a UA and urine culture. She will also have a BMET (as has been on Lasix) and CBC (to determine WBC and  H and H) checked. She has an appointment on Thursday to see Dr. [name]. INR will also be checked on this day. She does not like taking Lasix and will discuss with Dr. [name] if she can take Spironolactone instead. She will return to our office in one week with a chest x ray. I will contact her with lab results. We will discuss driving and activity at next appointment.    Nani Skillern, PA-C [facility] [phone]

## 2019-07-09 ENCOUNTER — Other Ambulatory Visit: Payer: Self-pay | Admitting: *Deleted

## 2019-07-09 ENCOUNTER — Telehealth: Payer: Self-pay | Admitting: Physician Assistant

## 2019-07-09 ENCOUNTER — Other Ambulatory Visit (HOSPITAL_COMMUNITY)
Admission: RE | Admit: 2019-07-09 | Discharge: 2019-07-09 | Disposition: A | Discharge status: Home / Self Care | Payer: Medicare Other | Source: Ambulatory Visit | Attending: Thoracic Surgery (Cardiothoracic Vascular Surgery) | Admitting: Thoracic Surgery (Cardiothoracic Vascular Surgery)

## 2019-07-09 DIAGNOSIS — R509 Fever, unspecified: Secondary | ICD-10-CM

## 2019-07-09 DIAGNOSIS — Z1159 Encounter for screening for other viral diseases: Secondary | ICD-10-CM | POA: Diagnosis not present

## 2019-07-09 DIAGNOSIS — D72829 Elevated white blood cell count, unspecified: Secondary | ICD-10-CM

## 2019-07-09 DIAGNOSIS — J90 Pleural effusion, not elsewhere classified: Secondary | ICD-10-CM

## 2019-07-09 NOTE — Telephone Encounter (Signed)
I spoke with patient's daughter and Adriana Hendricks just a few minutes ago regarding lab results. She is anemic with an H and H of 9.4 and 26.7;however that is only slightly decreased from last H and H in the hospital. I told her she could take daily oral Ferrous but she is so sensitive to medication that she prefers not to. Her INR is 3.1. She was instructed to not take Coumadin tonight. She will  need a repeat INR as she is scheduled for a left thoracentesis tomorrow. Her creatinine is 0.76 and potassium is 4.2. Also, her WBC is slightly elevated at 12,800 and she has been having low grade (99-100) fevers. 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 She has an allergy to many antibiotics. I will discuss with Dr. Roxy Manns as am unsure of etiology. UC is pending. My office will contact outpatient IR as patient really needs left thoracentesis sooner rather than later. We will also confirm that she needs a repeat INR since elevated. My office was instructed to contact patient after speaking with outpatient IR department.

## 2019-07-10 ENCOUNTER — Ambulatory Visit (HOSPITAL_COMMUNITY)
Admission: RE | Admit: 2019-07-10 | Discharge: 2019-07-10 | Disposition: A | Discharge status: Home / Self Care | Payer: Medicare Other | Source: Ambulatory Visit | Attending: Thoracic Surgery (Cardiothoracic Vascular Surgery) | Admitting: Thoracic Surgery (Cardiothoracic Vascular Surgery)

## 2019-07-10 ENCOUNTER — Other Ambulatory Visit: Payer: Self-pay

## 2019-07-10 ENCOUNTER — Other Ambulatory Visit: Payer: Self-pay | Admitting: *Deleted

## 2019-07-10 ENCOUNTER — Encounter (HOSPITAL_COMMUNITY): Payer: Self-pay | Admitting: Student

## 2019-07-10 ENCOUNTER — Other Ambulatory Visit: Payer: Self-pay | Admitting: Thoracic Surgery (Cardiothoracic Vascular Surgery)

## 2019-07-10 DIAGNOSIS — D72829 Elevated white blood cell count, unspecified: Secondary | ICD-10-CM | POA: Diagnosis not present

## 2019-07-10 DIAGNOSIS — R509 Fever, unspecified: Secondary | ICD-10-CM

## 2019-07-10 DIAGNOSIS — J90 Pleural effusion, not elsewhere classified: Secondary | ICD-10-CM

## 2019-07-10 HISTORY — PX: IR THORACENTESIS ASP PLEURAL SPACE W/IMG GUIDE: IMG5380

## 2019-07-10 LAB — SARS CORONAVIRUS 2 (TAT 6-24 HRS): SARS Coronavirus 2: NEGATIVE

## 2019-07-10 LAB — GRAM STAIN

## 2019-07-10 MED ORDER — LIDOCAINE HCL 1 % IJ SOLN
INTRAMUSCULAR | Status: AC
  Filled 2019-07-10: qty 20

## 2019-07-10 NOTE — Procedures (Signed)
PROCEDURE SUMMARY:  Successful image-guided left thoracentesis. Yielded 1.0 liter of dark red fluid. Patient tolerated procedure well. No immediate complications. EBL = 5 mL.  Specimen was sent for labs. CXR ordered.  Haven Foss PA-C 07/10/2019 2:15 PM

## 2019-07-11 ENCOUNTER — Ambulatory Visit (INDEPENDENT_AMBULATORY_CARE_PROVIDER_SITE_OTHER): Payer: Medicare Other | Admitting: Cardiology

## 2019-07-11 ENCOUNTER — Ambulatory Visit (INDEPENDENT_AMBULATORY_CARE_PROVIDER_SITE_OTHER): Payer: Medicare Other | Admitting: Pharmacist

## 2019-07-11 ENCOUNTER — Encounter: Payer: Self-pay | Admitting: Cardiology

## 2019-07-11 DIAGNOSIS — Z8679 Personal history of other diseases of the circulatory system: Secondary | ICD-10-CM

## 2019-07-11 DIAGNOSIS — I071 Rheumatic tricuspid insufficiency: Secondary | ICD-10-CM | POA: Diagnosis not present

## 2019-07-11 DIAGNOSIS — I2510 Atherosclerotic heart disease of native coronary artery without angina pectoris: Secondary | ICD-10-CM | POA: Diagnosis not present

## 2019-07-11 DIAGNOSIS — Z9889 Other specified postprocedural states: Secondary | ICD-10-CM

## 2019-07-11 DIAGNOSIS — I4819 Other persistent atrial fibrillation: Secondary | ICD-10-CM | POA: Diagnosis not present

## 2019-07-11 DIAGNOSIS — I10 Essential (primary) hypertension: Secondary | ICD-10-CM | POA: Diagnosis not present

## 2019-07-11 DIAGNOSIS — Z7901 Long term (current) use of anticoagulants: Secondary | ICD-10-CM

## 2019-07-11 DIAGNOSIS — I4892 Unspecified atrial flutter: Secondary | ICD-10-CM

## 2019-07-11 DIAGNOSIS — Z01818 Encounter for other preprocedural examination: Secondary | ICD-10-CM | POA: Diagnosis not present

## 2019-07-11 LAB — POCT INR: INR: 2.4 (ref 2.0–3.0)

## 2019-07-11 NOTE — Patient Instructions (Addendum)
Medication Instructions:  Starting tomorrow metoprolol tartrate 12.5 mg  Three times a day  -Friday , Saturday and Sunday  starting Monday  12.5 mg breakfast ,  mid day , and at bedtime take 25 mg    If you need a refill on your cardiac medications before your next appointment, please call your pharmacy.   Lab work: Need labs FOLLOW INSTRUCTIONS If you have labs (blood work) drawn today and your tests are completely normal, you will receive your results only by: Marland Kitchen MyChart Message (if you have MyChart) OR . A paper copy in the mail If you have any lab test that is abnormal or we need to change your treatment, we will call you to review the results.  Testing/Procedures:   will set up for cardioversion  July 24 ,2020 Your physician has recommended that you have a Cardioversion (DCCV). Electrical Cardioversion uses a jolt of electricity to your heart either through paddles or wired patches attached to your chest. This is a controlled, usually prescheduled, procedure. Defibrillation is done under light anesthesia in the hospital, and you usually go home the day of the procedure. This is done to get your heart back into a normal rhythm. You are not awake for the procedure. Please see the instruction sheet given to you today.  Follow-Up: At [facility], you and your health needs are our priority.  As part of our continuing mission to provide you with exceptional heart care, we have created designated Provider Care Teams.  These Care Teams include your primary Cardiologist (physician) and Advanced Practice Providers (APPs -  Physician Assistants and Nurse Practitioners) who all work together to provide you with the care you need, when you need it. . You will need a follow up appointment in 2 weeks after cardioversion.  Please call our office 2 months in advance to schedule this appointment.  You may see Glenetta Hew, MD  ( Oct 9,2020 at 1:40 pm) 2 months or one of the following Advanced Practice  Providers on your designated Care Team:   . Rosaria Ferries, PA-C . Jory Sims, DNP, ANP-  Aug 10 ,2020 at 1:45 pm  Any Other Special Instructions Will Be Listed Below (If Applicable).  Will contact you in regards to cardioversion=on july 24,2020

## 2019-07-11 NOTE — Progress Notes (Signed)
PCP: Marin Olp, MD  Clinic Note: Chief Complaint  Patient presents with  . Follow-up    Postop TVR  . Atrial Fibrillation    Status post maze  . Atrial Flutter    HPI: Adriana Hendricks is a 84 y.o. 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    CYANA SHOOK was last seen on 06/27/2019 by Jory Sims, DNP -- noted increased SOB since d/c - with minimal exertion. -- noted extensive tremor, worse in evenings.  Was in A Flutter when seen in CVTS July 08, 2019--> CXR --> Thoracentesis; Low grade Fever - checked for UTI. -- started on Lopressor 12.5 mg BID -- per Cardiology - was started on Lasix 20 mg (she does not like it) - preferred over spironolactone 2/2 low BP -- noted drop in Hgb to 9.4, started on PO Iron.   Recent Hospitalizations:   6/17-24/2020: TV repair  -- minimally invasive tricuspid valve repair and maze procedure   Complex valvuloplasty including autologous pericardial patch augmentation of anterior and posterior leaflets, Edwards MC-3 Ring annuloplasty   LAA clipping & Complete Bilateral Atrial MAZE cryoablatoin  Temp PM placed for transient bradycardai.  ER 7/3 - Cystitis - changed Abx b/c sensitivities   Studies Personally Reviewed - (if available, images/films reviewed: From Epic Chart or Care Everywhere)  Intra-op TEE 06/12/2019 - post-op TV repair - stable ring in place.  No change in LVEF, WM & other valves.  7/13 - CXR with large L pleural effusion & small R pleural effusion, minimal Pulmonary vascular congetsion  IR: L sided Thoracentecis (1L) (07/10/2019)  F/u CXR - No PTX. Reduction is L Pleural effusion with improved aeration of L Lung base - some patchy atelectasis.  Interval History: Adriana Hendricks actually is feeling much better today after her thoracentesis.  She does feel  her heart rate up a little bit, but really does not notice that is going that fast.  She is not having any PND or orthopnea.  Just seems fatigued.  Her blood pressure has been pretty low and has not really been able to tolerate the beta-blocker that she has been on.  They were scared to do 12-1/2 twice daily initially. She is no longer taking Lasix, and is not sure because she still has some edema.  She denied having any chest pain or pressure with rest or exertion.  Her exertional dyspnea is notably improved, but she just noticed fatigue.  No syncope/near syncope or TIA/amaurosis fugax.  No melena, hematochezia, hematuria, or epstaxis. No claudication.  ROS: A comprehensive was performed. Review of Systems  Constitutional: Positive for malaise/fatigue (Just feels worn out after her operation). Negative for weight loss.  HENT: Negative for congestion and nosebleeds.   Respiratory: Positive for shortness of breath (Notably improved). Negative for cough and wheezing.   Cardiovascular: Negative for claudication.  Gastrointestinal: Positive for heartburn. Negative for abdominal pain, blood in stool, melena and nausea.  Genitourinary: Negative for hematuria.  Musculoskeletal: Negative for falls and joint pain.  Neurological: Positive for dizziness (Off and on). Negative for focal weakness and weakness.       Slow deliberate gait  Psychiatric/Behavioral: Negative for memory loss. The patient is not nervous/anxious and does not have insomnia.   All other systems reviewed and are negative.   I have reviewed and (if  needed) personally updated the patient's problem list, medications, allergies, past medical and surgical history, social and family history.   Past Medical History:  Diagnosis Date  . Achilles tendon rupture   . Arthritis of hand, right   . Atrial flutter with rapid ventricular response ([place])   . Carpal tunnel syndrome 09/04/2013  . Collagenous colitis 2005  . Colon polyps 2010    Tubular adenoma and hyperplastic  . Dental infection 10/25/2014  . Diverticulosis   . Esophageal stenosis   . Essential tremor   . GERD (gastroesophageal reflux disease)    hx hiatal hernia, hx esophagitis, hx stricture  . Hiatal hernia   . Hypertension   . Lower extremity neuropathy 08/23/2013   On B12 therapy with numbness in the feet bilaterally no evidence of diabetes   . Ocular migraine    jagged vision, a few per month  . OSA (obstructive sleep apnea) 12/21/2016   on CPAP  . Peripheral neuropathy    treated by Dr Posey Pronto (07/2015)  . Persistent atrial fibrillation    Rate control with beta-blocker and anticoagulated with warfarin  . S/P Maze operation for atrial fibrillation 06/12/2019   Complete bilateral atrial lesion set using cryothermy and bipolar radiofrequency ablation with clipping of LA appendage via right mini thoracotomy approach  . S/P minimally invasive tricuspid valve repair 06/12/2019   Complex valvuloplasty including autologous pericardial patch augmentation of anterior and posterior leaflets with 28 mm Edwards mc3 ring annuloplasty via right mini thoracotomy approach  . Severe tricuspid regurgitation 07/2018   Noted Aug 2019 during a fib workup. Severe LAE as well  . Sinus node dysfunction [facility])     Past Surgical History:  Procedure Laterality Date  . 48 hr Holter Monitor  07/2018   Persistent Afib (rate 33 - 124 bpm)  . APPENDECTOMY    . CARDIAC CATHETERIZATION    . CARDIOVERSION N/A 10/02/2018   Procedure: CARDIOVERSION;  Surgeon: Acie Fredrickson, Wonda Cheng, MD;  Location: [facility] ENDOSCOPY;  Service: Cardiovascular;  Laterality: N/A;  . CARPAL TUNNEL RELEASE    . CATARACT EXTRACTION    . EXCISION MORTON'S NEUROMA     Right foot  . EYE SURGERY     Cataracts with implants-bilateral  . INTRAOCULAR LENS IMPLANT, SECONDARY    . IR THORACENTESIS ASP PLEURAL SPACE W/IMG GUIDE  07/10/2019  . IR THORACENTESIS ASP PLEURAL SPACE W/IMG GUIDE  07/10/2019  . MINIMALLY INVASIVE MAZE  PROCEDURE N/A 06/12/2019   Procedure: MINIMALLY INVASIVE MAZE PROCEDURE;  Surgeon: Rexene Alberts, MD;  Location: [place];  Service: Open Heart Surgery;  Laterality: N/A;  . MINIMALLY INVASIVE TRICUSPID VALVE REPAIR Right 06/12/2019   Procedure: MINIMALLY INVASIVE TRICUSPID VALVE REPAIR USING EDWARDS MC3 T28 ANNULOPLASTY RING, INSERTION TEMPORARY TRANSVENOUS AV PACING LEAD;  Surgeon: Rexene Alberts, MD;  Location: [facility];  Service: Open Heart Surgery;  Laterality: Right;  . Moles removed    . MOUTH SURGERY     (For Exostosis)  . NUCLEAR  07/2018   EF 71 %. LOW RISK - no ischemia or Infarct.   Marland Kitchen RIGHT/LEFT HEART CATH AND CORONARY ANGIOGRAPHY N/A 01/16/2019   Procedure: RIGHT/LEFT HEART CATH AND CORONARY ANGIOGRAPHY;  Surgeon: Leonie Man, MD;  Location: [facility] CV LAB;; Angiographically normal coronary arteries.  Normal LVEDP. Upper Limit of Normal PAP~23 mmHg & PCWP 18 mmHg.  LVEDP of 7 mmHg. -With mean PAP of 23 mmHg there is a TPG suggesting a primary pulmonary etiology.  Marland Kitchen ROTATOR CUFF REPAIR    .  TEE WITHOUT CARDIOVERSION N/A 01/16/2019   Procedure: TRANSESOPHAGEAL ECHOCARDIOGRAM (TEE);  Surgeon: Sueanne Margarita, MD;  Location: [facility] ENDOSCOPY;;  Severe TR due to poor coaptation of the leaflets from annular dilation.  Mild to moderate mitral vegetation with mildly restricted mobility of the posterior leaflet.  EF 55-60% with no R WMA.  Severe RA and LA dilation.  . TEE WITHOUT CARDIOVERSION N/A 06/12/2019   Procedure: TRANSESOPHAGEAL ECHOCARDIOGRAM (TEE);  Surgeon: Rexene Alberts, MD;  Location: [facility];  Service: Open Heart Surgery;  Laterality: N/A;  . TONSILLECTOMY    . TRANSTHORACIC ECHOCARDIOGRAM  07/2018   Normal LV Fxn (EF 60-65%) no RWMA.  Severe LA dilation & Severe TR.    Current Meds  Medication Sig  . aspirin EC 81 MG EC tablet Take 1 tablet (81 mg total) by mouth daily.  . B Complex Vitamins (B COMPLEX 100 PO) Take 1 tablet by mouth daily.   . calcium carbonate (TUMS -  DOSED IN MG ELEMENTAL CALCIUM) 500 MG chewable tablet Chew 1 tablet by mouth daily.   . cholecalciferol (VITAMIN D3) 25 MCG (1000 UT) tablet Take 1,000 Units by mouth daily.  . famotidine (PEPCID) 20 MG tablet Take 20 mg by mouth at bedtime.  . furosemide (LASIX) 40 MG tablet Take 40 mg by mouth as directed. 1/2 tablet every other day  . metoprolol tartrate (LOPRESSOR) 25 MG tablet Take 0.5 tablets (12.5 mg total) by mouth 2 (two) times daily.  . Multiple Vitamins-Minerals (PRESERVISION AREDS 2 PO) Take 2 tablets by mouth 2 (two) times a day.  . potassium chloride 20 MEQ/15ML (10%) SOLN Take by mouth as directed. Take on days you take Furosemide (lasix)  . warfarin (COUMADIN) 2.5 MG tablet Take 2 tablets (5 mg total) by mouth daily at 6 PM for 30 days. Take 2 tablets daily or as instructed by [facility].    Allergies  Allergen Reactions  . Levofloxacin Other (See Comments)    Developed tendon pain. (Has a history of a Achilles tendon tear)  . Potassium Chloride Hives  . Clarithromycin Nausea Only  . Erythromycin Base Nausea And Vomiting  . Metronidazole Rash  . Penicillins Rash    Did it involve swelling of the face/tongue/throat, SOB, or low BP? No Did it involve sudden or severe rash/hives, skin peeling, or any reaction on the inside of your mouth or nose? No Did you need to seek medical attention at a hospital or doctor's office? No When did it last happen? as a child If all above answers are "NO", may proceed with cephalosporin use.     Social History   Tobacco Use  . Smoking status: Former Smoker    Packs/day: 2.00    Years: 13.00    Pack years: 26.00    Types: Cigarettes    Start date: 12/26/1954    Quit date: 03/26/1968    Years since quitting: 51.3  . Smokeless tobacco: Never Used  . Tobacco comment: Quit in 1969 - smokeed 2-3PPD , was 3 PPD at her heaviest for about 3 years.   Substance Use Topics  . Alcohol use: Yes    Alcohol/week: 2.0 standard drinks    Types:  2 Glasses of wine per week    Comment: 2 glass of wine per day  . Drug use: No   Social History   Social History Narrative   Married. Husband is patient of Dr. Yong Channel.    Married 1958. 2 children. 4 grandkids (3 girls, 1 boy).  Retired from [employer].   Hobbies: genealogy and DNA    family history includes Barrett's esophagus in her son; Heart failure in her father; Other in her mother; Rectal cancer in her father.  Wt Readings from Last 3 Encounters:  07/11/19 163 lb 9.6 oz (74.2 kg)  07/08/19 164 lb (74.4 kg)  06/26/19 164 lb (74.4 kg)    PHYSICAL EXAM BP 130/81   Pulse (!) 123   Temp 98.2 F (36.8 C)   Ht 5\' 5"  (1.651 m)   Wt 163 lb 9.6 oz (74.2 kg)   LMP  (LMP Unknown)   SpO2 100%   BMI 27.22 kg/m  Physical Exam  Constitutional: She is oriented to person, place, and time. She appears well-developed and well-nourished. No distress.  Still a bit frail-appearing woman.  Is in a wheelchair because of generalized weakness  HENT:  Head: Normocephalic and atraumatic.  Neck: Normal range of motion. Neck supple. No hepatojugular reflux and no JVD present. Carotid bruit is not present.  Cardiovascular: Regular rhythm and S1 normal.  No extrasystoles are present. Tachycardia present. PMI is not displaced. Exam reveals decreased pulses (Mildly decreased pedal). Exam reveals no gallop and no S4.  Murmur (Soft HSM at apex) heard. Hard to tell if there is split S2 with tachycardia  Pulmonary/Chest: Effort normal. No respiratory distress. She has no wheezes. She has no rales. She exhibits tenderness (Postop).  Mildly diminished bibasal breath sounds, worse on the left  Abdominal: Soft. Bowel sounds are normal. She exhibits no distension. There is no abdominal tenderness. There is no rebound.  Musculoskeletal: Normal range of motion.        General: Edema (Trivial) present.  Neurological: She is alert and oriented to person, place, and time.   Psychiatric: She has a normal mood and affect. Her behavior is normal. Judgment and thought content normal.  Vitals reviewed.    Adult ECG Report  Rate: 123 ;  Rhythm: atrial flutter and RBBB.  T wave inversions noted.;   Narrative Interpretation: Atrial flutter appears new (very regular)   Other studies Reviewed: Additional studies/ records that were reviewed today include:  Recent Labs:   Lab Results  Component Value Date   CHOL 191 09/18/2018   HDL 53.70 09/18/2018   LDLCALC 116 (H) 09/18/2018   LDLDIRECT 128.8 01/17/2011   TRIG 106.0 09/18/2018   CHOLHDL 4 09/18/2018   Lab Results  Component Value Date   CREATININE 0.84 06/26/2019   BUN 15 06/26/2019   NA 138 06/26/2019   K 4.1 06/26/2019   CL 96 06/26/2019   CO2 23 06/26/2019   CBC Latest Ref Rng & Units 06/26/2019 06/18/2019 06/16/2019  WBC 3.4 - 10.8 x10E3/uL 12.3(H) 12.6(H) 18.1(H)  Hemoglobin 11.1 - 15.9 g/dL 9.5(L) 9.1(L) 10.0(L)  Hematocrit 34.0 - 46.6 % 27.6(L) 27.1(L) 29.3(L)  Platelets 150 - 450 x10E3/uL 389 158 119(L)     ASSESSMENT / PLAN: Problem List Items Addressed This Visit    Tricuspid valve insufficiency (Chronic)    Status post complicated repair of rheumatic tricuspid valve regurgitation.  Back on warfarin for also history of A. fib.      S/P minimally invasive tricuspid valve repair + maze procedure    Complex valvuloplasty by Dr. Roxy Manns. We will defer timing of follow-up echocardiogram to CVTS.      S/P Maze operation for atrial fibrillation    MAZE operation plus LAA clipping.  This should minimize A. fib recurrence as well  as risk of stroke.  However now she is having atrial flutter.  Not sure if this is typical versus atypical flutter as it could be related to scar tissue.      Persistent atrial fibrillation (Chronic)    Previously working on rate control.  Had Maze procedure with left atrial appendage clipping intraoperatively with tricuspid repair.  I think the current rhythm  worsening now is atrial flutter. She had significant bradycardia in the hospital so I am leery of using amiodarone or other anti-agents.  Currently on 12.5 mg twice daily metoprolol. Is on warfarin.      Relevant Orders   EKG 12-Lead (Completed)   Basic metabolic panel   CBC   Essential hypertension (Chronic)    Actually, her blood pressure has been low postoperatively.  Only able to tolerate low-dose metoprolol in addition to diuretic.      Atrial flutter ([place])    Was in atrial flutter earlier this week prior to thoracentesis. She is now on warfarin, and should be at almost weeks of therapeutic INR as of next Thursday.  Plan for now will be to increase to 12.5 mg 3 times daily metoprolol for more rate control, recheck EKG when she comes in for INR check next Thursday (7/23).  If still in atrial flutter, will schedule for DCCV on Friday 7/24.      Relevant Orders   Basic metabolic panel   CBC    Other Visit Diagnoses    Pre-op testing       Relevant Orders   Basic metabolic panel   CBC       I spent a total of 30 minutes with the patient and chart review. >  50% of the time was spent in direct patient consultation.   Current medicines are reviewed at length with the patient today.  (+/- concerns) hardly able to tolerate low-dose beta-blocker The following changes have been made:  See below  Patient Instructions  Medication Instructions:  Starting tomorrow metoprolol tartrate 12.5 mg  Three times a day  -Friday , Saturday and Sunday  starting Monday  12.5 mg breakfast ,  mid day , and at bedtime take 25 mg    If you need a refill on your cardiac medications before your next appointment, please call your pharmacy.   Lab work: Need labs FOLLOW INSTRUCTIONS If you have labs (blood work) drawn today and your tests are completely normal, you will receive your results only by: Marland Kitchen MyChart Message (if you have MyChart) OR . A paper copy in the mail If you have any lab test  that is abnormal or we need to change your treatment, we will call you to review the results.  Testing/Procedures:   will set up for cardioversion  July 24 ,2020 Your physician has recommended that you have a Cardioversion (DCCV). Electrical Cardioversion uses a jolt of electricity to your heart either through paddles or wired patches attached to your chest. This is a controlled, usually prescheduled, procedure. Defibrillation is done under light anesthesia in the hospital, and you usually go home the day of the procedure. This is done to get your heart back into a normal rhythm. You are not awake for the procedure. Please see the instruction sheet given to you today.  Follow-Up: At [facility], you and your health needs are our priority.  As part of our continuing mission to provide you with exceptional heart care, we have created designated Provider Care Teams.  These Care Teams include  your primary Cardiologist (physician) and Advanced Practice Providers (APPs -  Physician Assistants and Nurse Practitioners) who all work together to provide you with the care you need, when you need it. . You will need a follow up appointment in 2 weeks after cardioversion.  Please call our office 2 months in advance to schedule this appointment.  You may see Glenetta Hew, MD  ( Oct 9,2020 at 1:40 pm) 2 months or one of the following Advanced Practice Providers on your designated Care Team:   . Rosaria Ferries, PA-C . Jory Sims, DNP, ANP-  Aug 10 ,2020 at 1:45 pm  Any Other Special Instructions Will Be Listed Below (If Applicable).  Will contact you in regards to cardioversion=on july 24,2020    Studies Ordered:   Orders Placed This Encounter  Procedures  . Basic metabolic panel  . CBC  . EKG 12-Lead      Glenetta Hew, M.D., M.S. Interventional Cardiologist   Pager # [phone] Phone # [phone] [address] [place], [place] [address]   Thank you for choosing  Heartcare at [facility]!!

## 2019-07-12 ENCOUNTER — Other Ambulatory Visit: Payer: Self-pay | Admitting: *Deleted

## 2019-07-12 ENCOUNTER — Telehealth: Payer: Self-pay | Admitting: *Deleted

## 2019-07-12 ENCOUNTER — Other Ambulatory Visit: Payer: Self-pay | Admitting: Physician Assistant

## 2019-07-12 ENCOUNTER — Ambulatory Visit (HOSPITAL_COMMUNITY): Payer: Medicare Other

## 2019-07-12 ENCOUNTER — Telehealth: Payer: Self-pay | Admitting: Physician Assistant

## 2019-07-12 DIAGNOSIS — I4819 Other persistent atrial fibrillation: Secondary | ICD-10-CM

## 2019-07-12 DIAGNOSIS — I4892 Unspecified atrial flutter: Secondary | ICD-10-CM

## 2019-07-12 DIAGNOSIS — Z01818 Encounter for other preprocedural examination: Secondary | ICD-10-CM

## 2019-07-12 MED ORDER — CEPHALEXIN 500 MG PO CAPS: 500.0000 mg | ORAL_CAPSULE | Freq: Two times a day (BID) | ORAL | 0 refills | Status: AC

## 2019-07-12 NOTE — Telephone Encounter (Signed)
I spoke with Adriana Hendricks and [name] this am regarding the following: 1. Repeat CBC showed WBC increased to 14,500 2. Urine culture showed 25-50,000 mixed urogenital flora. Of note, patient reports kind of burning at times with urination 3. Left pleural fluid culture shows no growth to date 4. Blood cultures results are pending 5. As discussed with Dr. Roxy Manns, will give Keflex 500 mg bid for one week (has had TV repair)  Patient reports she is breathing much better since having thoracentesis. She had INR drawn yesterday and it is down to 2.4. She was in a flutter again yesterday and Dr. [name] may do cardioversion at the end of next week. Patient is coming to the office on Monday for follow up

## 2019-07-12 NOTE — Telephone Encounter (Signed)
CALLED AND SPOKE TO PATIENT AND DAUGHTER HOLLY,  INSTRUCTION  AND DIRECTION GIVEN TO BOTH  IN REGARDS TO  UPCOMING TESTS, EKG FOR July 23 ,2020 AND CARDIOVERSION July 24,2020.   LETTER WAS SENT BY MYCHART APPOINTMENT PLACED IN MYCHART AS WELL, POST CARDIOVERSION AND 2 MONTH APPOINTMENT.  LABS SLIP GIVEN TO  [facility] TECH.  PATIENT AND DAUGHTER VERBALIZED UNDERSTANDING.

## 2019-07-12 NOTE — Progress Notes (Signed)
SCHEDULE RAPID TEST FOR CARDIOVERSION FOR 07/19/19

## 2019-07-13 ENCOUNTER — Encounter: Payer: Self-pay | Admitting: Cardiology

## 2019-07-13 DIAGNOSIS — I4892 Unspecified atrial flutter: Secondary | ICD-10-CM | POA: Insufficient documentation

## 2019-07-13 NOTE — Assessment & Plan Note (Signed)
Status post complicated repair of rheumatic tricuspid valve regurgitation.  Back on warfarin for also history of A. fib.

## 2019-07-13 NOTE — Assessment & Plan Note (Signed)
MAZE operation plus LAA clipping.  This should minimize A. fib recurrence as well as risk of stroke.  However now she is having atrial flutter.  Not sure if this is typical versus atypical flutter as it could be related to scar tissue.

## 2019-07-13 NOTE — Assessment & Plan Note (Signed)
Complex valvuloplasty by Dr. Roxy Manns. We will defer timing of follow-up echocardiogram to CVTS.

## 2019-07-13 NOTE — Assessment & Plan Note (Signed)
Actually, her blood pressure has been low postoperatively.  Only able to tolerate low-dose metoprolol in addition to diuretic.

## 2019-07-13 NOTE — Assessment & Plan Note (Signed)
Previously working on rate control.  Had Maze procedure with left atrial appendage clipping intraoperatively with tricuspid repair.  I think the current rhythm worsening now is atrial flutter. She had significant bradycardia in the hospital so I am leery of using amiodarone or other anti-agents.  Currently on 12.5 mg twice daily metoprolol. Is on warfarin.

## 2019-07-13 NOTE — Assessment & Plan Note (Signed)
Was in atrial flutter earlier this week prior to thoracentesis. She is now on warfarin, and should be at almost weeks of therapeutic INR as of next Thursday.  Plan for now will be to increase to 12.5 mg 3 times daily metoprolol for more rate control, recheck EKG when she comes in for INR check next Thursday (7/23).  If still in atrial flutter, will schedule for DCCV on Friday 7/24.

## 2019-07-15 ENCOUNTER — Other Ambulatory Visit: Payer: Self-pay | Admitting: Thoracic Surgery (Cardiothoracic Vascular Surgery)

## 2019-07-15 ENCOUNTER — Ambulatory Visit
Admission: RE | Admit: 2019-07-15 | Discharge: 2019-07-15 | Disposition: A | Discharge status: Home / Self Care | Payer: Medicare Other | Source: Ambulatory Visit | Attending: Thoracic Surgery (Cardiothoracic Vascular Surgery) | Admitting: Thoracic Surgery (Cardiothoracic Vascular Surgery)

## 2019-07-15 ENCOUNTER — Ambulatory Visit (INDEPENDENT_AMBULATORY_CARE_PROVIDER_SITE_OTHER): Payer: Self-pay | Admitting: Physician Assistant

## 2019-07-15 ENCOUNTER — Other Ambulatory Visit: Payer: Self-pay

## 2019-07-15 VITALS — BP 126/77 | HR 88 | Temp 97.7°F | Resp 18 | Ht 65.0 in | Wt 165.0 lb

## 2019-07-15 DIAGNOSIS — J90 Pleural effusion, not elsewhere classified: Secondary | ICD-10-CM

## 2019-07-15 DIAGNOSIS — R0602 Shortness of breath: Secondary | ICD-10-CM | POA: Diagnosis not present

## 2019-07-15 DIAGNOSIS — Z9889 Other specified postprocedural states: Secondary | ICD-10-CM

## 2019-07-15 LAB — CULTURE, BODY FLUID W GRAM STAIN -BOTTLE: Culture: NO GROWTH

## 2019-07-15 NOTE — Patient Instructions (Signed)
You may return to driving an automobile as long as you are no longer requiring oral narcotic pain relievers during the daytime.  It would be wise to start driving only short distances during the daylight and gradually increase from there as you feel comfortable.   Make every effort to maintain a "heart-healthy" lifestyle with regular physical exercise and adherence to a low-fat, low-carbohydrate diet.  Continue to seek regular follow-up appointments with your primary care physician and/or cardiologist.   You may continue to gradually increase your physical activity as tolerated.  Refrain from any heavy lifting or strenuous use of your arms and shoulders until at least 8 weeks from the time of your surgery, and avoid activities that cause increased pain in your chest on the side of your surgical incision.  Otherwise you may continue to increase activities without any particular limitations.  Increase the intensity and duration of physical activity gradually.   Endocarditis is a potentially serious infection of heart valves or inside lining of the heart.  It occurs more commonly in patients with diseased heart valves (such as patient's with aortic or mitral valve disease) and in patients who have undergone heart valve repair or replacement.  Certain surgical and dental procedures may put you at risk, such as dental cleaning, other dental procedures, or any surgery involving the respiratory, urinary, gastrointestinal tract, gallbladder or prostate gland.   To minimize your chances for develooping endocarditis, maintain good oral health and seek prompt medical attention for any infections involving the mouth, teeth, gums, skin or urinary tract.    Always notify your doctor or dentist about your underlying heart valve condition before having any invasive procedures. You will need to take antibiotics before certain procedures, including all routine dental cleanings or other dental procedures.  Your  cardiologist or dentist should prescribe these antibiotics for you to be taken ahead of time.       

## 2019-07-15 NOTE — Progress Notes (Signed)
HPI:  Patient returns for follow up having undergone Thoracentesis on 7/15 for left sided pleural effusion.  Since our last visit the patient has been evaluated by Dr. [name], who agreed the patient was in A. Flutter.  It was recommended she undergo cardioversion which has been set up for this Friday.  She has completed her course of ABX.  She denies any further urinary symptoms.  Her daughter is taking her vital signs every 6 hours and states she has continued to have a low grade fever at times.  She states she continues to get winded with ambulation, however the patient states she feels better than she has in previous weeks.  She is tolerating lopressor without difficulty.  Her daughter states she has been tolerating 20 mg of Lasix.  Current Outpatient Medications  Medication Sig Dispense Refill  . aspirin EC 81 MG EC tablet Take 1 tablet (81 mg total) by mouth daily.    . B Complex Vitamins (B COMPLEX 100 PO) Take 1 tablet by mouth daily.     . calcium carbonate (TUMS - DOSED IN MG ELEMENTAL CALCIUM) 500 MG chewable tablet Chew 1 tablet by mouth daily.     . cephALEXin (KEFLEX) 500 MG capsule Take 1 capsule (500 mg total) by mouth 2 (two) times daily for 7 days. Patient has taken recently and tolerated 14 capsule 0  . cholecalciferol (VITAMIN D3) 25 MCG (1000 UT) tablet Take 1,000 Units by mouth daily.    . famotidine (PEPCID) 20 MG tablet Take 20 mg by mouth at bedtime.    . furosemide (LASIX) 40 MG tablet Take 40 mg by mouth as directed. 1/2 tablet every other day    . metoprolol tartrate (LOPRESSOR) 25 MG tablet Take 0.5 tablets (12.5 mg total) by mouth 2 (two) times daily. 60 tablet 1  . Multiple Vitamins-Minerals (PRESERVISION AREDS 2 PO) Take 2 tablets by mouth 2 (two) times a day.    . potassium chloride 20 MEQ/15ML (10%) SOLN Take by mouth as directed. Take on days you take Furosemide (lasix)    . warfarin (COUMADIN) 2.5 MG tablet Take 2 tablets (5 mg total) by mouth daily at 6 PM for 30  days. Take 2 tablets daily or as instructed by [facility]. 60 tablet 11   No current facility-administered medications for this visit.     Physical Exam:  BP 126/77 (BP Location: Right Arm, Patient Position: Sitting, Cuff Size: Normal)   Pulse 88   Temp 97.7 F (36.5 C) Comment: thermal  Resp 18   Ht 5\' 5"  (1.651 m)   Wt 165 lb (74.8 kg)   LMP  (LMP Unknown)   SpO2 97% Comment: RA  BMI 27.46 kg/m   Gen: no apparent distress Heart: RRR Lungs: Diminished bilaterally Incisions: well healed, no evidence of infection  Diagnostic Tests:  CXR: recurrence of left pleural effusion   1. Recurrent Pleural effusions- S/P Thoracentesis performed 7/15 with removal of 1L of fluid... she was instructed to continue Lasix 20 mg daily 2. A. Flutter, planning for cardioversion this Friday if remains in flutter, labs with Dr. [name] on Thursday and repeat EKG 3. ID- continues to have low grade temperatures, her UA was negative, Blood cultures were negative, Fluid culture were negative, incision sites are well healed and show no signs of infection... no obvious source of temperature, has CBC scheduled for Thursday 4. Plan- will have patient keep scheduled cardioversion for later this week.  Continue Lasix 20 mg daily, she will  return to our office next Monday with repeat CXR to reassess need for possible repeat Thoracentesis vs. Possible insertion of Pleur-x catheter.. patient instructed should she develop worsening shortness of breath, she should contact our office prior to her Monday appointment   Ellwood Handler, PA-C [facility] [phone]

## 2019-07-16 ENCOUNTER — Telehealth: Payer: Self-pay

## 2019-07-16 NOTE — Telephone Encounter (Signed)

## 2019-07-18 ENCOUNTER — Ambulatory Visit (INDEPENDENT_AMBULATORY_CARE_PROVIDER_SITE_OTHER): Payer: Medicare Other | Admitting: Pharmacist

## 2019-07-18 ENCOUNTER — Inpatient Hospital Stay (HOSPITAL_COMMUNITY): Admission: RE | Admit: 2019-07-18 | Payer: Medicare Other | Source: Ambulatory Visit

## 2019-07-18 ENCOUNTER — Ambulatory Visit (INDEPENDENT_AMBULATORY_CARE_PROVIDER_SITE_OTHER): Payer: Medicare Other | Admitting: Cardiology

## 2019-07-18 ENCOUNTER — Other Ambulatory Visit: Payer: Self-pay

## 2019-07-18 DIAGNOSIS — I4819 Other persistent atrial fibrillation: Secondary | ICD-10-CM | POA: Diagnosis not present

## 2019-07-18 DIAGNOSIS — Z01818 Encounter for other preprocedural examination: Secondary | ICD-10-CM | POA: Diagnosis not present

## 2019-07-18 DIAGNOSIS — I4892 Unspecified atrial flutter: Secondary | ICD-10-CM | POA: Diagnosis not present

## 2019-07-18 DIAGNOSIS — Z7901 Long term (current) use of anticoagulants: Secondary | ICD-10-CM | POA: Diagnosis not present

## 2019-07-18 LAB — CBC
Hematocrit: 27.8 % — ABNORMAL LOW (ref 34.0–46.6)
Hemoglobin: 9.1 g/dL — ABNORMAL LOW (ref 11.1–15.9)
MCH: 29.6 pg (ref 26.6–33.0)
MCHC: 32.7 g/dL (ref 31.5–35.7)
MCV: 91 fL (ref 79–97)
Platelets: 395 10*3/uL (ref 150–450)
RBC: 3.07 x10E6/uL — ABNORMAL LOW (ref 3.77–5.28)
RDW: 13.5 % (ref 11.7–15.4)
WBC: 8.7 10*3/uL (ref 3.4–10.8)

## 2019-07-18 LAB — BASIC METABOLIC PANEL
BUN/Creatinine Ratio: 13 (ref 12–28)
BUN: 11 mg/dL (ref 8–27)
CO2: 25 mmol/L (ref 20–29)
Calcium: 9.4 mg/dL (ref 8.7–10.3)
Chloride: 95 mmol/L — ABNORMAL LOW (ref 96–106)
Creatinine, Ser: 0.87 mg/dL (ref 0.57–1.00)
GFR calc Af Amer: 72 mL/min/{1.73_m2} (ref >59)
GFR calc non Af Amer: 62 mL/min/{1.73_m2} (ref >59)
Glucose: 104 mg/dL — ABNORMAL HIGH (ref 65–99)
Potassium: 4 mmol/L (ref 3.5–5.2)
Sodium: 136 mmol/L (ref 134–144)

## 2019-07-18 LAB — POCT INR: INR: 2.8 (ref 2.0–3.0)

## 2019-07-18 NOTE — Progress Notes (Signed)
1. Reason for visit: Pre-cardioversion EKG   2. Name of MD requesting visit: Dr. [name]    3. Assessment and plan per MD: Dr. Martinique (DOD) reviewed EKG and report it shows pt is back in NSR with a HR on 90. Per MD, pt is ok to cancel cardioversion scheduled for tomorrow 7/24. Pt updated and scheduling department made aware.

## 2019-07-18 NOTE — Progress Notes (Signed)
Attempted to reach patient regarding procedure tomorrow in endo, patient did not answer and had automated voicemail so no message was left.

## 2019-07-19 ENCOUNTER — Ambulatory Visit (HOSPITAL_COMMUNITY): Admission: RE | Admit: 2019-07-19 | Payer: Medicare Other | Source: Home / Self Care | Admitting: Internal Medicine

## 2019-07-19 ENCOUNTER — Other Ambulatory Visit: Payer: Self-pay | Admitting: Thoracic Surgery (Cardiothoracic Vascular Surgery)

## 2019-07-19 ENCOUNTER — Encounter (HOSPITAL_COMMUNITY): Admission: RE | Payer: Medicare Other | Source: Home / Self Care

## 2019-07-19 DIAGNOSIS — J90 Pleural effusion, not elsewhere classified: Secondary | ICD-10-CM

## 2019-07-19 SURGERY — CARDIOVERSION
Anesthesia: General

## 2019-07-22 ENCOUNTER — Ambulatory Visit: Payer: Medicare Other | Admitting: Pulmonary Disease

## 2019-07-22 ENCOUNTER — Other Ambulatory Visit: Payer: Self-pay

## 2019-07-22 ENCOUNTER — Ambulatory Visit (INDEPENDENT_AMBULATORY_CARE_PROVIDER_SITE_OTHER): Payer: Self-pay | Admitting: Thoracic Surgery (Cardiothoracic Vascular Surgery)

## 2019-07-22 ENCOUNTER — Encounter: Payer: Self-pay | Admitting: Thoracic Surgery (Cardiothoracic Vascular Surgery)

## 2019-07-22 ENCOUNTER — Other Ambulatory Visit: Payer: Self-pay | Admitting: *Deleted

## 2019-07-22 ENCOUNTER — Ambulatory Visit
Admission: RE | Admit: 2019-07-22 | Discharge: 2019-07-22 | Disposition: A | Discharge status: Home / Self Care | Payer: Medicare Other | Source: Ambulatory Visit | Attending: Thoracic Surgery (Cardiothoracic Vascular Surgery) | Admitting: Thoracic Surgery (Cardiothoracic Vascular Surgery)

## 2019-07-22 VITALS — BP 124/67 | HR 100 | Temp 97.9°F | Resp 20 | Ht 65.0 in | Wt 159.0 lb

## 2019-07-22 DIAGNOSIS — R05 Cough: Secondary | ICD-10-CM | POA: Diagnosis not present

## 2019-07-22 DIAGNOSIS — I071 Rheumatic tricuspid insufficiency: Secondary | ICD-10-CM

## 2019-07-22 DIAGNOSIS — J90 Pleural effusion, not elsewhere classified: Secondary | ICD-10-CM

## 2019-07-22 DIAGNOSIS — R0602 Shortness of breath: Secondary | ICD-10-CM | POA: Diagnosis not present

## 2019-07-22 DIAGNOSIS — Z9889 Other specified postprocedural states: Secondary | ICD-10-CM

## 2019-07-22 DIAGNOSIS — Z8679 Personal history of other diseases of the circulatory system: Secondary | ICD-10-CM

## 2019-07-22 DIAGNOSIS — I4819 Other persistent atrial fibrillation: Secondary | ICD-10-CM

## 2019-07-22 NOTE — Progress Notes (Signed)
[address]       [place] [address]             [phone]     CARDIOTHORACIC SURGERY OFFICE NOTE  Referring Provider is Leonie Man, MD PCP is Marin Olp, MD   HPI:  Patient is an 84 year old female with history of hypertension and obstructive sleep apnea on home CPAP who returns the office today for follow-up status post minimally invasive tricuspid valve repair and Maze procedure on June 12, 2023 rheumatic heart disease with severe symptomatic tricuspid regurgitation and recurrent persistent atrial fibrillation.  The patient's early postoperative recovery was notable for sinus node dysfunction with bradycardia which slowly improved.  She was eventually discharged from the hospital on the seventh postoperative day.  Because of her sinus node dysfunction she was not taking any beta-blocker.  Since hospital discharge she developed recurrent persistent atrial fibrillation or atrial flutter with elevated heart rate.  She was later started on metoprolol.  She also developed left pleural effusion which required thoracentesis 2 weeks ago.  She was last seen in our office on July 15, 2019 at which time follow-up chest x-ray revealed some reaccumulation of the left pleural effusion.  She was scheduled for DC cardioversion last week but twelve-lead EKG performed July 18, 2019 revealed that the patient had spontaneously converted back into sinus rhythm.  Cardioversion was canceled.  She returns her office today and reports that she is doing better.  She is accompanied by her daughter for her office visit.  She is starting to walk more.  She still gets short of breath with exertion but this is slowly improving.  She reports occasional cough that is nonproductive.  She is not having fevers or chills.  Appetite is good.  She is sleeping well at night.  She is taking Lasix once a day and watching her weight daily.  She has no pain in her chest.   Current Outpatient  Medications  Medication Sig Dispense Refill  . aspirin EC 81 MG EC tablet Take 1 tablet (81 mg total) by mouth daily.    . B Complex Vitamins (B COMPLEX 100 PO) Take 1 tablet by mouth daily.     . calcium carbonate (TUMS - DOSED IN MG ELEMENTAL CALCIUM) 500 MG chewable tablet Chew 1 tablet by mouth daily.     . cholecalciferol (VITAMIN D3) 25 MCG (1000 UT) tablet Take 1,000 Units by mouth daily.    . famotidine (PEPCID) 20 MG tablet Take 20 mg by mouth at bedtime.    . furosemide (LASIX) 40 MG tablet Take 20 mg by mouth daily.     . metoprolol tartrate (LOPRESSOR) 25 MG tablet Take 0.5 tablets (12.5 mg total) by mouth 2 (two) times daily. (Patient taking differently: Take 12.5-25 mg by mouth See admin instructions. Take 12.5 mg by mouth twice daily. Take 25 mg by mouth at bedtime.) 60 tablet 1  . Multiple Vitamins-Minerals (PRESERVISION AREDS 2 PO) Take 2 capsules by mouth daily.     . potassium chloride 20 MEQ/15ML (10%) SOLN Take 20 mEq by mouth daily.     Marland Kitchen warfarin (COUMADIN) 2.5 MG tablet Take 2 tablets (5 mg total) by mouth daily at 6 PM for 30 days. Take 2 tablets daily or as instructed by [facility]. (Patient taking differently: Take 2.5 mg by mouth daily. ) 60 tablet 11   No current facility-administered medications for this visit.  Physical Exam:   BP 124/67   Pulse 100   Temp 97.9 F (36.6 C) (Skin)   Resp 20   Ht 5\' 5"  (1.651 m)   Wt 159 lb (72.1 kg)   LMP  (LMP Unknown)   SpO2 96% Comment: RA  BMI 26.46 kg/m   General:  Well-appearing  Chest:   Clear to auscultation with slightly diminished breath sounds left lung base  CV:   Regular rate and rhythm without murmur  Incisions:  Well-healed  Abdomen:  Soft nontender  Extremities:  Warm and well-perfused, no edema  Diagnostic Tests:  2 channel telemetry rhythm strip demonstrates sinus rhythm   CHEST - 2 VIEW  COMPARISON:  07/15/2019  FINDINGS: Interval improvement in a moderate left pleural  effusion with associated atelectasis or consolidation. The right lung is normally aerated, with near-total resolution of a previously seen right pleural effusion. No new or focal airspace opacity. Unchanged cardiomegaly with tricuspid annular prosthesis and left atrial appendage clip.  IMPRESSION: Interval improvement in a moderate left pleural effusion with associated atelectasis or consolidation. The right lung is normally aerated, with near-total resolution of a previously seen right pleural effusion. No new or focal airspace opacity. Unchanged cardiomegaly with tricuspid annular prosthesis and left atrial appendage clip.   Electronically Signed   By: Eddie Candle M.D.   On: 07/22/2019 08:53   Impression:  Patient is making slow but steady improvement more than 5-week status post minimally invasive tricuspid valve repair and Maze procedure.  She spontaneously converted back into sinus rhythm.  I have personally reviewed the patient's follow-up chest x-ray performed this morning which demonstrates decreased size of left pleural effusion.    Plan:  We have not recommended any change to the patient's current medications.  I have encouraged the patient to continue to gradually increase her physical activity as tolerated without any particular limitations.  I have encouraged her to participate in a cardiac rehab program.  She will remain on daily Lasix and potassium.  She will remain on her current regimen for metoprolol.  She will need routine follow-up echocardiogram performed in approximately 4 to 6 weeks.  The patient will return to our office in approximately 6 weeks to review the results of her echocardiogram and get 1 more follow-up chest x-ray.  She will call and return sooner if she develops worsening shortness of breath.  All of her questions have been addressed.    Adriana Hendricks. Roxy Manns, MD 07/22/2019 9:35 AM

## 2019-07-22 NOTE — Patient Instructions (Signed)
Continue all previous medications without any changes at this time  You may continue to gradually increase your physical activity as tolerated.  Refrain from any heavy lifting or strenuous use of your arms and shoulders until at least 8 weeks from the time of your surgery, and avoid activities that cause increased pain in your chest on the side of your surgical incision.  Otherwise you may continue to increase activities without any particular limitations.  Increase the intensity and duration of physical activity gradually.  You may return to driving an automobile as long as you are no longer requiring oral narcotic pain relievers during the daytime.  It would be wise to start driving only short distances during the daylight and gradually increase from there as you feel comfortable.  Endocarditis is a potentially serious infection of heart valves or inside lining of the heart.  It occurs more commonly in patients with diseased heart valves (such as patient's with aortic or mitral valve disease) and in patients who have undergone heart valve repair or replacement.  Certain surgical and dental procedures may put you at risk, such as dental cleaning, other dental procedures, or any surgery involving the respiratory, urinary, gastrointestinal tract, gallbladder or prostate gland.   To minimize your chances for develooping endocarditis, maintain good oral health and seek prompt medical attention for any infections involving the mouth, teeth, gums, skin or urinary tract.    Always notify your doctor or dentist about your underlying heart valve condition before having any invasive procedures. You will need to take antibiotics before certain procedures, including all routine dental cleanings or other dental procedures.  Your cardiologist or dentist should prescribe these antibiotics for you to be taken ahead of time.

## 2019-07-24 ENCOUNTER — Telehealth: Payer: Self-pay

## 2019-07-24 NOTE — Telephone Encounter (Signed)
Patient contacted and made aware. 

## 2019-07-24 NOTE — Telephone Encounter (Signed)
-----   Message from Rexene Alberts, MD sent at 07/23/2019  5:48 PM EDT ----- Regarding: RE: Fever I just saw her on Monday No fevers.  No mention of anything other than some dyspnea which has been improving She looked great and CXR looked better Unless fever >101 or increased SOB I would not do anything different ----- Message ----- From: Donnella Sham, RN Sent: 07/23/2019   2:12 PM EDT To: Rexene Alberts, MD Subject: Fever                                          Hey,  Patient's daughter, [name] contacted the office stating she is running a temperature with chills.  No other symptoms.  Asked how often it was being checked, she stated 2 times a day, but have checked it 3 times since lunch.  I know she has been running a low grade temperature for a while now and she has had cultures, but wanted to make sure you were aware.  Please advise.  Thanks,  Caryl Pina

## 2019-07-25 ENCOUNTER — Ambulatory Visit
Admission: RE | Admit: 2019-07-25 | Discharge: 2019-07-25 | Disposition: A | Discharge status: Home / Self Care | Payer: Medicare Other | Source: Ambulatory Visit | Attending: Thoracic Surgery (Cardiothoracic Vascular Surgery) | Admitting: Thoracic Surgery (Cardiothoracic Vascular Surgery)

## 2019-07-25 ENCOUNTER — Encounter: Payer: Self-pay | Admitting: Pulmonary Disease

## 2019-07-25 ENCOUNTER — Other Ambulatory Visit: Payer: Self-pay

## 2019-07-25 ENCOUNTER — Telehealth (INDEPENDENT_AMBULATORY_CARE_PROVIDER_SITE_OTHER): Payer: Medicare Other | Admitting: Pulmonary Disease

## 2019-07-25 ENCOUNTER — Telehealth: Payer: Self-pay

## 2019-07-25 DIAGNOSIS — G4733 Obstructive sleep apnea (adult) (pediatric): Secondary | ICD-10-CM | POA: Diagnosis not present

## 2019-07-25 DIAGNOSIS — J90 Pleural effusion, not elsewhere classified: Secondary | ICD-10-CM | POA: Diagnosis not present

## 2019-07-25 DIAGNOSIS — Z954 Presence of other heart-valve replacement: Secondary | ICD-10-CM

## 2019-07-25 LAB — CBC WITH DIFFERENTIAL/PLATELET
Absolute Monocytes: 1047 cells/uL — ABNORMAL HIGH (ref 200–950)
Basophils Absolute: 14 cells/uL (ref 0–200)
Basophils Relative: 0.1 %
Eosinophils Absolute: 14 cells/uL — ABNORMAL LOW (ref 15–500)
Eosinophils Relative: 0.1 %
HCT: 26.3 % — ABNORMAL LOW (ref 35.0–45.0)
Hemoglobin: 8.8 g/dL — ABNORMAL LOW (ref 11.7–15.5)
Lymphs Abs: 1006 cells/uL (ref 850–3900)
MCH: 28.9 pg (ref 27.0–33.0)
MCHC: 33.5 g/dL (ref 32.0–36.0)
MCV: 86.5 fL (ref 80.0–100.0)
MPV: 10.2 fL (ref 7.5–12.5)
Monocytes Relative: 7.7 %
Neutro Abs: 11519 cells/uL — ABNORMAL HIGH (ref 1500–7800)
Neutrophils Relative %: 84.7 %
Platelets: 374 10*3/uL (ref 140–400)
RBC: 3.04 10*6/uL — ABNORMAL LOW (ref 3.80–5.10)
RDW: 14.2 % (ref 11.0–15.0)
Total Lymphocyte: 7.4 %
WBC: 13.6 10*3/uL — ABNORMAL HIGH (ref 3.8–10.8)

## 2019-07-25 NOTE — Telephone Encounter (Signed)
-----   Message from Rexene Alberts, MD sent at 07/25/2019 12:28 PM EDT ----- Regarding: RE: Tempertures Get a CXR, CBC and blood cultures done ----- Message ----- From: Donnella Sham, RN Sent: 07/25/2019  11:32 AM EDT To: Rexene Alberts, MD Subject: Jovita Kussmaul,  Patient's daughter, Faythe Dingwall contacted the office stating Ms. Baysinger has been running a temperature of 101.6 last night and 101.2 today.  Also stated she is having increased sharp pain in her left side/ breast.  Please advise.  Thanks,  Caryl Pina

## 2019-07-25 NOTE — Telephone Encounter (Signed)
Patient's daughter made aware of Dr. Guy Sandifer orders and advised where to get lab work done.

## 2019-07-25 NOTE — Progress Notes (Signed)
Adriana Hendricks, Adriana Hendricks, Adriana Hendricks  Chief Complaint  Patient presents with  . Follow-up    F/U for cpap. She recently had surgery June 17th for tricuspid valve. Temp 101.2. Currently taking coumadin 2.5mg .     Constitutional:  LMP  (LMP Unknown)   Deferred  Past Medical History:  TR s/p repair June 2020, A fib s/p MAZE, Neuropathy, HTN, HH, GERD, Essential tremor, Esophageal stenosis, Diverticulosis, Colon polyps, Collagenous colitis, OA  Brief Summary:  Adriana Hendricks is a 84 y.o. female with obstructive sleep apnea.  Virtual Visit via Video Note  I connected with Adriana Hendricks on 07/25/19 at 11:15 AM EDT by a video enabled telemedicine application Adriana verified that I am speaking with the correct person using two identifiers.  Location: Patient: home Provider: medical office   I discussed the limitations of evaluation Adriana management by telemedicine Adriana the availability of in person appointments. The patient expressed understanding Adriana agreed to proceed.  She had surgery with Dr. Ricard Dillon in June.  Had pleural effusion after Adriana had thoracentesis.  Had intermittent fevers.  Was on ABx last week.  Fever recurred once this was stopped.  Not having sinus congestion, cough, sputum, sore throat, abdominal pain, dysuria, or diarrhea.  Has intermittent sharp pain on Lt side.  She uses CPAP nightly.  Mask is tolerable.  Not having sinus congestion, sore throat, dry mouth, or aerophagia.  Her daughter participated in the visit.  Physical Exam:  Deferred   Assessment/Plan:   Obstructive sleep apnea. - she is compliant with CPAP Adriana reports benefit - continue auto CPAP range 5 to 8 cm H2O  Fever after recent cardiac surgery. - she recently completed ABx - she has called her chest surgeon for additional instructions  Patient Instructions  Follow up in 1 year  I discussed the assessment Adriana treatment plan with the patient. The patient was provided an opportunity  to ask questions Adriana all were answered. The patient agreed with the plan Adriana demonstrated an understanding of the instructions.   The patient was advised to call back or seek an in-person evaluation if the symptoms worsen or if the condition fails to improve as anticipated.  I provided 18 minutes of non-face-to-face time during this encounter.   Chesley Mires, MD [place] Pager: [phone] 07/25/2019, 12:09 PM  Flow Sheet      Sleep tests:  HST 12/14/16 >> AHI 19.5, SaO2 low 78% Auto CPAP 06/24/19 to 07/23/19 >> used on 30 of 30 nights with average 7 hrs 12 min.  Average AHI 0.1 with median CPAP 6 Adriana 95 th percentile CPAP 7 cm H2O  Cardiac tests:  Echo 08/02/18 >> EF 60 to 65%  Medications:   Allergies as of 07/25/2019      Reactions   Levofloxacin Other (See Comments)   Developed tendon pain. (Has a history of a Achilles tendon tear)   Potassium Chloride Hives   Clarithromycin Nausea Only   Erythromycin Base Nausea Adriana Vomiting   Metronidazole Rash   Penicillins Rash, Other (See Comments)   Did it involve swelling of the face/tongue/throat, SOB, or low BP? No Did it involve sudden or severe rash/hives, skin peeling, or any reaction on the inside of your mouth or nose? No Did you need to seek medical attention at a hospital or doctor's office? No When did it last happen? as a child If all above answers are "NO", may proceed with cephalosporin use.      Medication  List       Accurate as of July 25, 2019 12:09 PM. If you have any questions, ask your nurse or doctor.        aspirin 81 MG EC tablet Take 1 tablet (81 mg total) by mouth daily.   B COMPLEX 100 PO Take 1 tablet by mouth daily.   calcium carbonate 500 MG chewable tablet Commonly known as: TUMS - dosed in mg elemental calcium Chew 1 tablet by mouth daily.   cholecalciferol 25 MCG (1000 UT) tablet Commonly known as: VITAMIN D3 Take 1,000 Units by mouth daily.   famotidine 20 MG  tablet Commonly known as: PEPCID Take 20 mg by mouth at bedtime.   furosemide 40 MG tablet Commonly known as: LASIX Take 20 mg by mouth daily.   metoprolol tartrate 25 MG tablet Commonly known as: LOPRESSOR Take 0.5 tablets (12.5 mg total) by mouth 2 (two) times daily. What changed:   how much to take  when to take this  additional instructions   potassium chloride 20 MEQ/15ML (10%) Soln Take 20 mEq by mouth daily.   PRESERVISION AREDS 2 PO Take 2 capsules by mouth daily.   warfarin 2.5 MG tablet Commonly known as: Coumadin Take as directed by the anticoagulation clinic. If you are unsure how to take this medication, talk to your nurse or doctor. Original instructions: Take 2 tablets (5 mg total) by mouth daily at 6 PM for 30 days. Take 2 tablets daily or as instructed by Coumadin Clinic. What changed:   how much to take  when to take this  additional instructions       Past Surgical History:  She  has a past surgical history that includes Appendectomy; Tonsillectomy; Cataract extraction; Moles removed; Excision Morton's neuroma; Mouth surgery; Intraocular lens implant, secondary; Carpal tunnel release; Rotator cuff repair; 48 hr Holter Monitor (07/2018); transthoracic echocardiogram (07/2018); NUCLEAR (07/2018); Cardioversion (N/A, 10/02/2018); TEE without cardioversion (N/A, 01/16/2019); RIGHT/LEFT HEART CATH Adriana CORONARY ANGIOGRAPHY (N/A, 01/16/2019); Eye surgery; Cardiac catheterization; Minimally invasive tricuspid valve repair (Right, 06/12/2019); Minimally invasive maze procedure (N/A, 06/12/2019); TEE without cardioversion (N/A, 06/12/2019); IR THORACENTESIS ASP PLEURAL SPACE W/IMG GUIDE (07/10/2019); Adriana IR THORACENTESIS ASP PLEURAL SPACE W/IMG GUIDE (07/10/2019).  Family History:  Her family history includes Barrett's esophagus in her son; Heart failure in her father; Other in her mother; Rectal cancer in her father.  Social History:  She  reports that she quit  smoking about 51 years ago. Her smoking use included cigarettes. She started smoking about 64 years ago. She has a 26.00 pack-year smoking history. She has never used smokeless tobacco. She reports current alcohol use of about 2.0 standard drinks of alcohol per week. She reports that she does not use drugs.

## 2019-07-25 NOTE — Patient Instructions (Signed)
Follow up in 1 year.

## 2019-07-26 ENCOUNTER — Ambulatory Visit (INDEPENDENT_AMBULATORY_CARE_PROVIDER_SITE_OTHER): Payer: Medicare Other | Admitting: *Deleted

## 2019-07-26 ENCOUNTER — Other Ambulatory Visit: Payer: Self-pay

## 2019-07-26 ENCOUNTER — Other Ambulatory Visit (HOSPITAL_COMMUNITY)
Admission: RE | Admit: 2019-07-26 | Discharge: 2019-07-26 | Disposition: A | Discharge status: Home / Self Care | Payer: Medicare Other | Source: Ambulatory Visit | Attending: Cardiology | Admitting: Cardiology

## 2019-07-26 ENCOUNTER — Ambulatory Visit (HOSPITAL_COMMUNITY)
Admission: RE | Admit: 2019-07-26 | Discharge: 2019-07-26 | Disposition: A | Discharge status: Home / Self Care | Payer: Medicare Other | Source: Ambulatory Visit | Attending: Physician Assistant | Admitting: Physician Assistant

## 2019-07-26 ENCOUNTER — Ambulatory Visit (HOSPITAL_COMMUNITY)
Admission: RE | Admit: 2019-07-26 | Discharge: 2019-07-26 | Disposition: A | Discharge status: Home / Self Care | Payer: Medicare Other | Source: Ambulatory Visit | Attending: Thoracic Surgery (Cardiothoracic Vascular Surgery) | Admitting: Thoracic Surgery (Cardiothoracic Vascular Surgery)

## 2019-07-26 ENCOUNTER — Encounter (HOSPITAL_COMMUNITY): Payer: Self-pay | Admitting: Physician Assistant

## 2019-07-26 ENCOUNTER — Telehealth: Payer: Self-pay

## 2019-07-26 ENCOUNTER — Other Ambulatory Visit: Payer: Self-pay | Admitting: Thoracic Surgery (Cardiothoracic Vascular Surgery)

## 2019-07-26 ENCOUNTER — Other Ambulatory Visit: Payer: Self-pay | Admitting: *Deleted

## 2019-07-26 DIAGNOSIS — I4819 Other persistent atrial fibrillation: Secondary | ICD-10-CM | POA: Diagnosis not present

## 2019-07-26 DIAGNOSIS — Q231 Congenital insufficiency of aortic valve: Secondary | ICD-10-CM

## 2019-07-26 DIAGNOSIS — Z7901 Long term (current) use of anticoagulants: Secondary | ICD-10-CM

## 2019-07-26 DIAGNOSIS — R899 Unspecified abnormal finding in specimens from other organs, systems and tissues: Secondary | ICD-10-CM | POA: Diagnosis not present

## 2019-07-26 DIAGNOSIS — J90 Pleural effusion, not elsewhere classified: Secondary | ICD-10-CM

## 2019-07-26 DIAGNOSIS — Z20828 Contact with and (suspected) exposure to other viral communicable diseases: Secondary | ICD-10-CM | POA: Diagnosis not present

## 2019-07-26 HISTORY — PX: IR THORACENTESIS ASP PLEURAL SPACE W/IMG GUIDE: IMG5380

## 2019-07-26 LAB — GRAM STAIN

## 2019-07-26 LAB — BODY FLUID CELL COUNT WITH DIFFERENTIAL
Eos, Fluid: 0 %
Lymphs, Fluid: 7 %
Monocyte-Macrophage-Serous Fluid: 16 % — ABNORMAL LOW (ref 50–90)
Neutrophil Count, Fluid: 77 % — ABNORMAL HIGH (ref 0–25)
Total Nucleated Cell Count, Fluid: 850 cu mm (ref 0–1000)

## 2019-07-26 LAB — SARS CORONAVIRUS 2 BY RT PCR (HOSPITAL ORDER, PERFORMED IN ~~LOC~~ HOSPITAL LAB): SARS Coronavirus 2: NEGATIVE

## 2019-07-26 LAB — POCT INR: INR: 2.5 (ref 2.0–3.0)

## 2019-07-26 MED ORDER — LIDOCAINE HCL (PF) 1 % IJ SOLN
INTRAMUSCULAR | Status: DC | PRN
  Administered 2019-07-26: 10 mL

## 2019-07-26 MED ORDER — LIDOCAINE HCL 1 % IJ SOLN
INTRAMUSCULAR | Status: AC
  Filled 2019-07-26: qty 20

## 2019-07-26 NOTE — Procedures (Signed)
PROCEDURE SUMMARY:  Successful image-guided left thoracentesis. Yielded 900 milliliters of amber fluid. Procedure was stopped at this amount due to patient discomfort. Residual fluid remains on post procedure ultrasound. Patient tolerated procedure well. EBL: Zero No immediate complications.  Specimen was sent for labs. Post procedure CXR shows no pneumothorax.  Please see imaging section of Epic for full dictation.  Joaquim Nam PA-C 07/26/2019 1:43 PM

## 2019-07-26 NOTE — Telephone Encounter (Signed)
Patient contacted and made aware of appointment to have thoracentsis today due to increasing Pleural Effusion on her left lung per Dr. Roxy Manns.  Patient is to have procedure and a follow-up appointment with Dr. Roxy Manns on Monday 07/29/2019 with chest xray.  Patient and daughter, [name] aware.

## 2019-07-26 NOTE — Patient Instructions (Signed)
Description   Continue taking 2.5mg  daily.  Recheck in 1 week.   Call with any questions, medication changes or bleeding issues [facility] [phone].

## 2019-07-29 ENCOUNTER — Telehealth (HOSPITAL_COMMUNITY): Payer: Self-pay

## 2019-07-29 ENCOUNTER — Ambulatory Visit (INDEPENDENT_AMBULATORY_CARE_PROVIDER_SITE_OTHER): Payer: Self-pay | Admitting: Thoracic Surgery (Cardiothoracic Vascular Surgery)

## 2019-07-29 ENCOUNTER — Encounter: Payer: Self-pay | Admitting: Thoracic Surgery (Cardiothoracic Vascular Surgery)

## 2019-07-29 ENCOUNTER — Ambulatory Visit
Admission: RE | Admit: 2019-07-29 | Discharge: 2019-07-29 | Disposition: A | Discharge status: Home / Self Care | Payer: Medicare Other | Source: Ambulatory Visit | Attending: Thoracic Surgery (Cardiothoracic Vascular Surgery) | Admitting: Thoracic Surgery (Cardiothoracic Vascular Surgery)

## 2019-07-29 ENCOUNTER — Other Ambulatory Visit: Payer: Self-pay

## 2019-07-29 VITALS — BP 118/72 | HR 105 | Temp 97.8°F | Resp 20 | Ht 65.0 in | Wt 160.0 lb

## 2019-07-29 DIAGNOSIS — Q231 Congenital insufficiency of aortic valve: Secondary | ICD-10-CM

## 2019-07-29 DIAGNOSIS — Z9889 Other specified postprocedural states: Secondary | ICD-10-CM

## 2019-07-29 DIAGNOSIS — Z8679 Personal history of other diseases of the circulatory system: Secondary | ICD-10-CM

## 2019-07-29 DIAGNOSIS — J90 Pleural effusion, not elsewhere classified: Secondary | ICD-10-CM | POA: Insufficient documentation

## 2019-07-29 LAB — PATHOLOGIST SMEAR REVIEW

## 2019-07-29 NOTE — Progress Notes (Signed)
Adriana Hendricks       [place],Adriana Hendricks [id number]             [phone]     CARDIOTHORACIC SURGERY OFFICE NOTE  Primary Cardiologist is Glenetta Hew, MD PCP is Marin Olp, MD   HPI:  Patient returns the office today for follow-up of left pleural effusion status post minimally invasive tricuspid valve repair and Maze procedure on June 12, 2019 for rheumatic heart disease with severe symptomatic tricuspid regurgitation and recurrent persistent atrial fibrillation.  She was last seen in our office 1 week ago on July 22, 2019.  Since then she developed increased shortness of breath and low-grade fever.  Repeat chest x-ray confirmed further reaccumulation of the patient's left pleural effusion which was incompletely drained when she underwent thoracentesis 3 weeks ago.  She subsequently underwent thoracentesis July 26, 2019 yielding approximately 900 mL of thin serous fluid.  She returns to our office today for follow-up and reports that she feels dramatically better.  She has not had any fever over the weekend.  Her breathing is much improved.  Weight has been stable.   Current Outpatient Medications  Medication Sig Dispense Refill  . aspirin EC 81 MG EC tablet Take 1 tablet (81 mg total) by mouth daily.    . B Complex Vitamins (B COMPLEX 100 PO) Take 1 tablet by mouth daily.     . calcium carbonate (TUMS - DOSED IN MG ELEMENTAL CALCIUM) 500 MG chewable tablet Chew 1 tablet by mouth daily.     . cholecalciferol (VITAMIN D3) 25 MCG (1000 UT) tablet Take 1,000 Units by mouth daily.    . famotidine (PEPCID) 20 MG tablet Take 20 mg by mouth at bedtime.    . furosemide (LASIX) 40 MG tablet Take 20 mg by mouth daily.     . metoprolol succinate (TOPROL-XL) 25 MG 24 hr tablet Take 25 mg by mouth daily.    . metoprolol tartrate (LOPRESSOR) 25 MG tablet Take 0.5 tablets (12.5 mg total) by mouth 2 (two) times daily. (Patient taking differently: Take 12.5 mg by mouth 2 (two) times  daily. Take 12.5 mg by mouth twice daily. Take 25 mg by mouth at bedtime.) 60 tablet 1  . metoprolol tartrate (LOPRESSOR) 25 MG tablet Take 25 mg by mouth at bedtime.    . Multiple Vitamins-Minerals (PRESERVISION AREDS 2 PO) Take 2 capsules by mouth daily.     . potassium chloride 20 MEQ/15ML (10%) SOLN Take 20 mEq by mouth daily.     Marland Kitchen warfarin (COUMADIN) 2.5 MG tablet Take 2 tablets (5 mg total) by mouth daily at 6 PM for 30 days. Take 2 tablets daily or as instructed by [facility]. (Patient taking differently: Take 2.5 mg by mouth daily. ) 60 tablet 11   No current facility-administered medications for this visit.       Physical Exam:   BP 118/72   Pulse (!) 105   Temp 97.8 F (36.6 C) (Skin)   Resp 20   Ht 5\' 5"  (1.651 m)   Wt 160 lb (72.6 kg)   LMP  (LMP Unknown)   SpO2 97% Comment: RA  BMI 26.63 kg/m   General:  Well-appearing  Chest:   Clear  CV:   Regular rate and rhythm without murmur  Incisions:  Healing nicely  Abdomen:  Soft nontender  Extremities:  No edema  Diagnostic Tests:  CHEST - 2 VIEW  COMPARISON:  Chest x-rays dated  07/26/2019 and 07/25/2019  FINDINGS: Previous tricuspid valve repair on 06/12/2019. Clip on the left atrial appendage.  Chronic cardiomegaly. There has been almost complete resolution of the left pleural effusion since the prior exam. No infiltrates. Tiny right effusion has almost resolved. Minimal chronic bilateral apical pleural thickening.  IMPRESSION: Almost complete resolution of the left pleural effusion. Tiny residual right effusion.   Electronically Signed   By: Lorriane Shire M.D.   On: 07/29/2019 14:00   Impression:  Patient is doing well approximately 6 weeks status post minimally invasive tricuspid valve and maze procedure.  Her left pleural effusion has now completely resolved after repeat thoracentesis last week.    Plan:  We have not recommended any change the patient's current medications.  She  has asked about dosing of metoprolol, and I think it would be reasonable for her to go back to 25 mg by mouth twice daily instead of using the divided doses that she has been doing recently.  I have encouraged her to continue to gradually increase her physical activity.  We will see her back in September for follow-up with another chest x-ray.  She will call and return sooner for follow-up if she develops increased shortness of breath, pleuritic chest discomfort, and/or fevers which might be suggestive of reaccumulation of her pleural effusion.  All questions answered.    Valentina Gu. Roxy Manns, MD 07/29/2019 2:14 PM

## 2019-07-29 NOTE — Telephone Encounter (Signed)

## 2019-07-29 NOTE — Patient Instructions (Signed)
Continue all previous medications without any changes at this time  You may take your metoprolol 25 mg twice daily rather that the divided doses you are currently doing  You may resume unrestricted physical activity without any particular limitations at this time.  Continue to check your weight on a regular basis and keep a log for your records.  Look for signs of fluid overload such as worsening swelling of your lower legs, increased shortness of breath with activity, and/or a dry nonproductive cough.  Discussed these findings with your cardiologist including whether or not you should adjust your fluid pill dosage (diuretic).

## 2019-07-30 ENCOUNTER — Encounter (HOSPITAL_COMMUNITY)
Admission: RE | Admit: 2019-07-30 | Discharge: 2019-07-30 | Disposition: A | Discharge status: Home / Self Care | Payer: Self-pay | Source: Ambulatory Visit | Attending: Cardiology | Admitting: Cardiology

## 2019-07-30 NOTE — Progress Notes (Signed)
Called pt for Virtual Cardiac Rehab setup.  Unable to assist pt with the downloading of the [employer] app on her ipad.  Pt does not have a device that receives SMS messages. Attempted to sent the verification code to her email unsuccessful.  Advised pt that I would reach out the help desk of the the developer for Better He[employer] app for recommendations.  Indicated it may be a day or two. Will call pt back once I have received instructions for alternative to receiving verification code. Cherre Huger, BSN Cardiac and Training and development officer

## 2019-07-31 LAB — CULTURE, BODY FLUID W GRAM STAIN -BOTTLE: Culture: NO GROWTH

## 2019-08-02 ENCOUNTER — Ambulatory Visit (INDEPENDENT_AMBULATORY_CARE_PROVIDER_SITE_OTHER): Payer: Medicare Other | Admitting: Pharmacist Clinician (PhC)/ Clinical Pharmacy Specialist

## 2019-08-02 ENCOUNTER — Other Ambulatory Visit: Payer: Self-pay

## 2019-08-02 DIAGNOSIS — Z7901 Long term (current) use of anticoagulants: Secondary | ICD-10-CM

## 2019-08-02 DIAGNOSIS — I4819 Other persistent atrial fibrillation: Secondary | ICD-10-CM

## 2019-08-02 DIAGNOSIS — I4892 Unspecified atrial flutter: Secondary | ICD-10-CM

## 2019-08-02 LAB — CULTURE BLOOD MANUAL
Micro Number: 721678
Micro Number: 721682
Result: NO GROWTH
Result: NO GROWTH

## 2019-08-02 LAB — POCT INR: INR: 2.1 (ref 2.0–3.0)

## 2019-08-02 MED ORDER — METOPROLOL TARTRATE 25 MG PO TABS: 25.0000 mg | ORAL_TABLET | Freq: Two times a day (BID) | ORAL | 3 refills | Status: DC

## 2019-08-04 ENCOUNTER — Other Ambulatory Visit: Payer: Self-pay | Admitting: Thoracic Surgery (Cardiothoracic Vascular Surgery)

## 2019-08-04 NOTE — Progress Notes (Signed)
Cardiology Office Note   Date:  08/05/2019   ID:  Adriana Hendricks, DOB [birth year], MRN [id number]  PCP:  Marin Olp, MD  Cardiologist:  Dr.Harding  No chief complaint on file.    History of Present Illness: Adriana Hendricks is a 84 y.o. female who presents for ongoing assessment and management s/p TV Repair and MAZE with LAA clipping with hx of severe rheumatic TR), persistent atrial fib. She has also had an ultrasound guided thoracentesis on 07/26/2019 removing 900 cc of pleural fluid.  She was planned for a follow up echo via CVTS. She was continued on metoprolol 12.5 BID and warfarin.   She comes today with her daughter who is also a Equities trader.  The patient is beginning to have some worsening shortness of breath very similar to what she experiences when her pleural effusion returns.  Her daughter also said that she begins a low-grade fever as well.  Today her temperature is 99.8.  Her breathing status has worsened over the last week, with improvement of her breathing status right after thoracentesis, but states it began to decline the following week.  Is also noted the patient has chronic iron deficiency anemia.  The patient has been started on iron replacement by primary care.  She is to pick it up today.  Most recent hemoglobin was 8.8 on 07/25/2019.  She denies chest pain or dizziness but she has worsening fatigue.  Past Medical History:  Diagnosis Date  . Achilles tendon rupture   . Arthritis of hand, right   . Atrial flutter with rapid ventricular response ([place])   . Carpal tunnel syndrome 09/04/2013  . Collagenous colitis 2005  . Colon polyps 2010   Tubular adenoma and hyperplastic  . Dental infection 10/25/2014  . Diverticulosis   . Esophageal stenosis   . Essential tremor   . GERD (gastroesophageal reflux disease)    hx hiatal hernia, hx esophagitis, hx stricture  . Hiatal hernia   . Hypertension   . Lower extremity neuropathy 08/23/2013   On B12 therapy with  numbness in the feet bilaterally no evidence of diabetes   . Ocular migraine    jagged vision, a few per month  . OSA (obstructive sleep apnea) 12/21/2016   on CPAP  . Peripheral neuropathy    treated by Dr Posey Pronto (07/2015)  . Persistent atrial fibrillation    Rate control with beta-blocker and anticoagulated with warfarin  . Pleural effusion, left   . S/P Maze operation for atrial fibrillation 06/12/2019   Complete bilateral atrial lesion set using cryothermy and bipolar radiofrequency ablation with clipping of LA appendage via right mini thoracotomy approach  . S/P minimally invasive tricuspid valve repair 06/12/2019   Complex valvuloplasty including autologous pericardial patch augmentation of anterior and posterior leaflets with 28 mm Edwards mc3 ring annuloplasty via right mini thoracotomy approach  . Severe tricuspid regurgitation 07/2018   Noted Aug 2019 during a fib workup. Severe LAE as well  . Sinus node dysfunction [facility])     Past Surgical History:  Procedure Laterality Date  . 48 hr Holter Monitor  07/2018   Persistent Afib (rate 33 - 124 bpm)  . APPENDECTOMY    . CARDIAC CATHETERIZATION    . CARDIOVERSION N/A 10/02/2018   Procedure: CARDIOVERSION;  Surgeon: Acie Fredrickson, Wonda Cheng, MD;  Location: [facility] ENDOSCOPY;  Service: Cardiovascular;  Laterality: N/A;  . CARPAL TUNNEL RELEASE    . CATARACT EXTRACTION    . EXCISION MORTON'S NEUROMA  Right foot  . EYE SURGERY     Cataracts with implants-bilateral  . INTRAOCULAR LENS IMPLANT, SECONDARY    . IR THORACENTESIS ASP PLEURAL SPACE W/IMG GUIDE  07/10/2019  . IR THORACENTESIS ASP PLEURAL SPACE W/IMG GUIDE  07/10/2019  . IR THORACENTESIS ASP PLEURAL SPACE W/IMG GUIDE  07/26/2019  . MINIMALLY INVASIVE MAZE PROCEDURE N/A 06/12/2019   Procedure: MINIMALLY INVASIVE MAZE PROCEDURE;  Surgeon: Rexene Alberts, MD;  Location: [place];  Service: Open Heart Surgery;  Laterality: N/A;  . MINIMALLY INVASIVE TRICUSPID VALVE REPAIR Right 06/12/2019    Procedure: MINIMALLY INVASIVE TRICUSPID VALVE REPAIR USING EDWARDS MC3 T28 ANNULOPLASTY RING, INSERTION TEMPORARY TRANSVENOUS AV PACING LEAD;  Surgeon: Rexene Alberts, MD;  Location: [place];  Service: Open Heart Surgery;  Laterality: Right;  . Moles removed    . MOUTH SURGERY     (For Exostosis)  . NUCLEAR  07/2018   EF 71 %. LOW RISK - no ischemia or Infarct.   Marland Kitchen RIGHT/LEFT HEART CATH AND CORONARY ANGIOGRAPHY N/A 01/16/2019   Procedure: RIGHT/LEFT HEART CATH AND CORONARY ANGIOGRAPHY;  Surgeon: Leonie Man, MD;  Location: [place] CV LAB;; Angiographically normal coronary arteries.  Normal LVEDP. Upper Limit of Normal PAP~23 mmHg & PCWP 18 mmHg.  LVEDP of 7 mmHg. -With mean PAP of 23 mmHg there is a TPG suggesting a primary pulmonary etiology.  Marland Kitchen ROTATOR CUFF REPAIR    . TEE WITHOUT CARDIOVERSION N/A 01/16/2019   Procedure: TRANSESOPHAGEAL ECHOCARDIOGRAM (TEE);  Surgeon: Sueanne Margarita, MD;  Location: [facility] ENDOSCOPY;;  Severe TR due to poor coaptation of the leaflets from annular dilation.  Mild to moderate mitral vegetation with mildly restricted mobility of the posterior leaflet.  EF 55-60% with no R WMA.  Severe RA and LA dilation.  . TEE WITHOUT CARDIOVERSION N/A 06/12/2019   Procedure: TRANSESOPHAGEAL ECHOCARDIOGRAM (TEE);  Surgeon: Rexene Alberts, MD;  Location: [place];  Service: Open Heart Surgery;  Laterality: N/A;  . TONSILLECTOMY    . TRANSTHORACIC ECHOCARDIOGRAM  07/2018   Normal LV Fxn (EF 60-65%) no RWMA.  Severe LA dilation & Severe TR.     Current Outpatient Medications  Medication Sig Dispense Refill  . aspirin EC 81 MG EC tablet Take 1 tablet (81 mg total) by mouth daily.    . B Complex Vitamins (B COMPLEX 100 PO) Take 1 tablet by mouth daily.     . calcium carbonate (TUMS - DOSED IN MG ELEMENTAL CALCIUM) 500 MG chewable tablet Chew 1 tablet by mouth daily.     . cholecalciferol (VITAMIN D3) 25 MCG (1000 UT) tablet Take 1,000 Units by mouth daily.    . famotidine  (PEPCID) 20 MG tablet Take 20 mg by mouth at bedtime.    . furosemide (LASIX) 40 MG tablet Take 20 mg by mouth daily.     . metoprolol succinate (TOPROL-XL) 25 MG 24 hr tablet Take 25 mg by mouth daily.    . metoprolol tartrate (LOPRESSOR) 25 MG tablet Take 1 tablet (25 mg total) by mouth 2 (two) times daily. 180 tablet 3  . Multiple Vitamins-Minerals (PRESERVISION AREDS 2 PO) Take 2 capsules by mouth daily.     . potassium chloride 20 MEQ/15ML (10%) SOLN TAKE 15 MLS (20 MEQ) DAILY FOR 26 DOSES 390 mL 0  . warfarin (COUMADIN) 2.5 MG tablet Take 2 tablets (5 mg total) by mouth daily at 6 PM for 30 days. Take 2 tablets daily or as instructed by Coumadin Clinic. (Patient  taking differently: Take 2.5 mg by mouth daily. ) 60 tablet 11   No current facility-administered medications for this visit.     Allergies:   Levofloxacin, Potassium chloride, Clarithromycin, Erythromycin base, Metronidazole, and Penicillins    Social History:  The patient  reports that she quit smoking about 51 years ago. Her smoking use included cigarettes. She started smoking about 64 years ago. She has a 26.00 pack-year smoking history. She has never used smokeless tobacco. She reports current alcohol use of about 2.0 standard drinks of alcohol per week. She reports that she does not use drugs.   Family History:  The patient's family history includes Barrett's esophagus in her son; Heart failure in her father; Other in her mother; Rectal cancer in her father.    ROS: All other systems are reviewed and negative. Unless otherwise mentioned in H&P    PHYSICAL EXAM: VS:  BP 133/73   Pulse 96   Temp 99.8 F (37.7 C) (Temporal)   Ht 5\' 5"  (1.651 m)   Wt 160 lb (72.6 kg)   LMP  (LMP Unknown)   SpO2 97%   BMI 26.63 kg/m  , BMI Body mass index is 26.63 kg/m. GEN: Well nourished, well developed, in no acute distress HEENT: normal Neck: no JVD, carotid bruits, or masses Cardiac: RRR; no murmurs, rubs, or gallops,no edema   Respiratory: Diminished dull breath sounds in the left base, with some crackling noted in the right base.  No wheezing or rhonchi.  No cough. GI: soft, nontender, nondistended, + BS MS: no deformity or atrophy Skin: warm and dry, no rash Neuro:  Strength and sensation are intact Psych: euthymic mood, full affect   EKG: Normal sinus rhythm, T wave inversion in lead III.  Heart rate of 96 bpm.  Recent Labs: 06/10/2019: ALT 25 06/16/2019: Magnesium 2.0 06/26/2019: TSH 3.440 07/18/2019: BUN 11; Creatinine, Ser 0.87; Potassium 4.0; Sodium 136 07/25/2019: Hemoglobin 8.8; Platelets 374    Lipid Panel    Component Value Date/Time   CHOL 191 09/18/2018 1401   TRIG 106.0 09/18/2018 1401   HDL 53.70 09/18/2018 1401   CHOLHDL 4 09/18/2018 1401   VLDL 21.2 09/18/2018 1401   LDLCALC 116 (H) 09/18/2018 1401   LDLDIRECT 128.8 01/17/2011 0941      Wt Readings from Last 3 Encounters:  08/05/19 160 lb (72.6 kg)  07/29/19 160 lb (72.6 kg)  07/22/19 159 lb (72.1 kg)      Other studies Reviewed:  6/17-24/2020: TV repair  -- minimally invasive tricuspid valve repair and maze procedure   Complex valvuloplasty including autologous pericardial patch augmentation of anterior and posterior leaflets, Edwards MC-3 Ring annuloplasty  LAA clipping & Complete Bilateral Atrial MAZE cryoablatoin  Temp PM placed for transient bradycardai.  ER 7/3 - Cystitis - changed Abx b/c sensitivities   Studies Personally Reviewed - (if available, images/films reviewed: From Epic Chart or Care Everywhere)  Intra-op TEE 06/12/2019 - post-op TV repair - stable ring in place.  No change in LVEF, WM & other valves.  7/13 - CXR with large L pleural effusion & small R pleural effusion, minimal Pulmonary vascular congetsion ? IR: L sided Thoracentecis (1L) (07/10/2019)  F/u CXR - No PTX. Reduction is L Pleural effusion with improved aeration of L Lung base - some patchy atelectasis.   ASSESSMENT AND PLAN:  1.   Pleural effusion: Status post thoracentesis by Dr. Roxy Manns..  She is having return of symptoms to include fatigue, dyspnea on exertion, and low-grade fever  at 99.8.  Her daughter states that these are her usual symptoms when she begins to have return of her pleural effusion.  I will check a PA and lateral chest x-ray for evaluation of recurrent pleural effusion.  If this is found she will likely be referred back to Dr. Roxy Manns for recommendations concerning need for repeat thoracentesis.  She already has an appointment in September scheduled however with her symptoms I believe she may need to be seen sooner.  2.  Iron deficiency anemia: She is recently been started on oral iron supplements which she is due to pick up from the pharmacy today.  Most recent hemoglobin was 8.8 with hematocrit of 26.3 on 07/25/2019.  Her breathing status has worsened which is possibly from the pleural effusion but also can be related to her anemia.  She is tachycardic.  I am going to repeat her CBC in a couple of weeks giving her time to have her iron replacement started.  She may need to be referred to hematology for iron infusions if oral iron replacement is not improving anemia.  3.  Paroxysmal atrial fibrillation: Patient remains in normal sinus rhythm per EKG this office visit but her rate is not well controlled.  This may be multifactorial in the setting of anemia and possible return of pleural effusion.  I will not make any changes in her medications at this time until she has her chest x-ray to evaluate.  She is on warfarin 2.5 mg daily for CVA prophylaxis.  Most recent INR was 2.1, 1-week ago.  She denies any active bleeding or excessive bruising.  4.  Hypertension: Elevated on initial evaluation in the office, I repeated her blood pressure and found it to be 138/68.  No changes in her regimen at this time.  May need to consider increasing metoprolol if heart rate is not well controlled if pleural effusion is not found or if she  is not found to be morning.  Current medicines are reviewed at length with the patient today.    Labs/ tests ordered today include: PA and lateral chest x-ray, CBC (in 2 weeks)  Phill Myron. West Pugh, ANP, AACC   08/05/2019 4:46 PM    [address] Office [phone] Fax [phone]

## 2019-08-05 ENCOUNTER — Encounter: Payer: Self-pay | Admitting: Adult Health

## 2019-08-05 ENCOUNTER — Ambulatory Visit
Admission: RE | Admit: 2019-08-05 | Discharge: 2019-08-05 | Disposition: A | Discharge status: Home / Self Care | Payer: Medicare Other | Source: Ambulatory Visit | Attending: Adult Health | Admitting: Adult Health

## 2019-08-05 ENCOUNTER — Other Ambulatory Visit: Payer: Self-pay

## 2019-08-05 ENCOUNTER — Ambulatory Visit (INDEPENDENT_AMBULATORY_CARE_PROVIDER_SITE_OTHER): Payer: Medicare Other | Admitting: Adult Health

## 2019-08-05 ENCOUNTER — Ambulatory Visit (HOSPITAL_COMMUNITY): Payer: Medicare Other | Attending: Cardiology

## 2019-08-05 VITALS — BP 133/73 | HR 96 | Temp 99.8°F | Ht 65.0 in | Wt 160.0 lb

## 2019-08-05 DIAGNOSIS — D649 Anemia, unspecified: Secondary | ICD-10-CM | POA: Diagnosis not present

## 2019-08-05 DIAGNOSIS — I10 Essential (primary) hypertension: Secondary | ICD-10-CM | POA: Diagnosis not present

## 2019-08-05 DIAGNOSIS — J90 Pleural effusion, not elsewhere classified: Secondary | ICD-10-CM

## 2019-08-05 DIAGNOSIS — I071 Rheumatic tricuspid insufficiency: Secondary | ICD-10-CM | POA: Insufficient documentation

## 2019-08-05 DIAGNOSIS — I2510 Atherosclerotic heart disease of native coronary artery without angina pectoris: Secondary | ICD-10-CM | POA: Diagnosis not present

## 2019-08-05 HISTORY — PX: TRANSTHORACIC ECHOCARDIOGRAM: SHX275

## 2019-08-05 LAB — ECHOCARDIOGRAM COMPLETE
Height: 65 in
Weight: 2560 oz

## 2019-08-05 NOTE — Patient Instructions (Signed)
Medication Instructions:  Adriana Sims, DNP recommends that you continue on your current medications as directed. Please refer to the Current Medication list given to you today.  If you need a refill on your cardiac medications before your next appointment, please call your pharmacy.   Lab work: Your physician recommends that you return for lab work in 2 weeks.  If you have labs (blood work) drawn today and your tests are completely normal, you will receive your results only by: Marland Kitchen MyChart Message (if you have MyChart) OR . A paper copy in the mail If you have any lab test that is abnormal or we need to change your treatment, we will call you to review the results.  Testing/Procedures: Chest X-Ray - A chest x-ray takes a picture of the organs and structures inside the chest, including the heart, lungs, and blood vessels. This test can show several things, including, whether the heart is enlarges; whether fluid is building up in the lungs; and whether pacemaker / defibrillator leads are still in place.  This has been ordered to be completed at [facility] [place].   Follow-Up: Adriana Hendricks recommends that you schedule a follow-up appointment in 1 month with either Dr [name] or herself.

## 2019-08-06 ENCOUNTER — Telehealth: Payer: Self-pay | Admitting: Adult Health

## 2019-08-06 NOTE — Telephone Encounter (Signed)
New message   Patient states that she is returning a call from [employer] office from yesterday. Please call.

## 2019-08-06 NOTE — Telephone Encounter (Signed)
Adriana Hendricks spoke with pt daughter about her chest xray

## 2019-08-07 ENCOUNTER — Telehealth (HOSPITAL_COMMUNITY): Payer: Self-pay | Admitting: *Deleted

## 2019-08-07 ENCOUNTER — Encounter (HOSPITAL_COMMUNITY)
Admission: RE | Admit: 2019-08-07 | Discharge: 2019-08-07 | Disposition: A | Discharge status: Home / Self Care | Payer: Self-pay | Source: Ambulatory Visit | Attending: Cardiology | Admitting: Cardiology

## 2019-08-07 ENCOUNTER — Other Ambulatory Visit: Payer: Self-pay

## 2019-08-07 NOTE — Telephone Encounter (Signed)
         Confirm Consent - In the setting of the current Covid19 crisis, you are scheduled for a phone visit with your Cardiac or Pulmonary team member.  Just as we do with many in-gym visits, in order for you to participate in this visit, we must obtain consent.  If you'd like, I can send this to your mychart (if signed up) or email for you to review.  Otherwise, I can obtain your verbal consent now.  By agreeing to a telephone visit, we'd like you to understand that the technology does not allow for your Cardiac or Pulmonary Rehab team member to perform a physical assessment, and thus may limit their ability to fully assess your ability to perform exercise programs. If your provider identifies any concerns that need to be evaluated in person, we will make arrangements to do so.  Finally, though the technology is pretty good, we cannot assure that it will always work on either your or our end and we cannot ensure that we have a secure connection.  Cardiac and Pulmonary Rehab Telehealth visits and "At Home" cardiac and pulmonary rehab are provided at no cost to you.        Are you willing to proceed?"        STAFF: Did the patient verbally acknowledge consent to telehealth visit? Document YES/NO here: Yes     Maurice Small RN, BSN Cardiac and Pulmonary Rehab Nurse Navigator    Cardiac and Pulmonary Rehab Staff        Date 08/07/19   @ Time 1640             Pt  was able to download the [employer] app on their smart device with no issues. Pt set up their account and received the following welcome message -"Welcome to the [place] and Pulmonary Rehabilitation program. We hope that you will find the exercise program beneficial in your recovery process. Our staff is available to assist with any questions/concerns about your exercise routine. Best wishes". Brief orientation provided to with the advisement to watch the "Intro to Rehab" series located under the Resource tab. Pt verbalized  understanding. Will continue to follow and monitor pt progress with feedback as needed.

## 2019-08-08 ENCOUNTER — Other Ambulatory Visit: Payer: Self-pay

## 2019-08-08 DIAGNOSIS — I10 Essential (primary) hypertension: Secondary | ICD-10-CM

## 2019-08-08 MED ORDER — METOPROLOL TARTRATE 25 MG PO TABS: 25.0000 mg | ORAL_TABLET | Freq: Two times a day (BID) | ORAL | 3 refills | Status: DC

## 2019-08-12 ENCOUNTER — Ambulatory Visit
Admission: RE | Admit: 2019-08-12 | Discharge: 2019-08-12 | Disposition: A | Discharge status: Home / Self Care | Payer: Medicare Other | Source: Ambulatory Visit | Attending: Thoracic Surgery (Cardiothoracic Vascular Surgery) | Admitting: Thoracic Surgery (Cardiothoracic Vascular Surgery)

## 2019-08-12 ENCOUNTER — Other Ambulatory Visit: Payer: Self-pay | Admitting: Thoracic Surgery (Cardiothoracic Vascular Surgery)

## 2019-08-12 ENCOUNTER — Encounter: Payer: Self-pay | Admitting: Thoracic Surgery (Cardiothoracic Vascular Surgery)

## 2019-08-12 ENCOUNTER — Other Ambulatory Visit: Payer: Self-pay

## 2019-08-12 ENCOUNTER — Ambulatory Visit (INDEPENDENT_AMBULATORY_CARE_PROVIDER_SITE_OTHER): Payer: Self-pay | Admitting: Thoracic Surgery (Cardiothoracic Vascular Surgery)

## 2019-08-12 VITALS — Temp 97.7°F | Resp 16 | Ht 65.0 in | Wt 158.0 lb

## 2019-08-12 DIAGNOSIS — Q231 Congenital insufficiency of aortic valve: Secondary | ICD-10-CM

## 2019-08-12 DIAGNOSIS — J90 Pleural effusion, not elsewhere classified: Secondary | ICD-10-CM

## 2019-08-12 DIAGNOSIS — Z9889 Other specified postprocedural states: Secondary | ICD-10-CM

## 2019-08-12 NOTE — Progress Notes (Signed)
[address]       [place] [address]             [phone]     CARDIOTHORACIC SURGERY OFFICE NOTE  Primary Cardiologist is Glenetta Hew, MD PCP is Marin Olp, MD   HPI:  Patient returns the office today for follow-up of left pleural effusion status post minimally invasive tricuspid valve repair and Maze procedure on June 12, 2019 for rheumatic heart disease with severe symptomatic tricuspid regurgitation and recurrent persistent atrial fibrillation.  She was last seen here in our office on July 29, 2019 and she was seen by Jory Sims at [facility] on August 05, 2019.  At that time she was complaining of some shortness of breath and a repeat chest x-ray was performed demonstrating slight increased opacity at the left lung base consistent with possible reaccumulation of pleural effusion versus increased atelectasis.  She returns to our office today for another follow-up visit and chest x-ray.  Over the past week she has done well.  Her daughter has gone home.  She is now taking care of herself and walking every day.  Her weight has been stable.  She has not had fever.  Her breathing is good.  Overall she has no complaints.   Current Outpatient Medications  Medication Sig Dispense Refill   aspirin EC 81 MG EC tablet Take 1 tablet (81 mg total) by mouth daily.     B Complex Vitamins (B COMPLEX 100 PO) Take 1 tablet by mouth daily.      calcium carbonate (TUMS - DOSED IN MG ELEMENTAL CALCIUM) 500 MG chewable tablet Chew 1 tablet by mouth daily.      cholecalciferol (VITAMIN D3) 25 MCG (1000 UT) tablet Take 1,000 Units by mouth daily.     famotidine (PEPCID) 20 MG tablet Take 20 mg by mouth at bedtime.     furosemide (LASIX) 40 MG tablet Take 20 mg by mouth daily.      metoprolol tartrate (LOPRESSOR) 25 MG tablet Take 1 tablet (25 mg total) by mouth 2 (two) times daily. 60 tablet 3   Multiple Vitamins-Minerals (PRESERVISION AREDS 2 PO) Take 2  capsules by mouth daily.      potassium chloride 20 MEQ/15ML (10%) SOLN TAKE 15 MLS (20 MEQ) DAILY FOR 26 DOSES 390 mL 0   warfarin (COUMADIN) 2.5 MG tablet Take 2 tablets (5 mg total) by mouth daily at 6 PM for 30 days. Take 2 tablets daily or as instructed by [facility]. (Patient taking differently: Take 2.5 mg by mouth daily. ) 60 tablet 11   No current facility-administered medications for this visit.       Physical Exam:   Temp 97.7 F (36.5 C)    Resp 16    Ht 5\' 5"  (1.651 m)    Wt 158 lb (71.7 kg)    LMP  (LMP Unknown)    SpO2 95% Comment: RA   BMI 26.29 kg/m   General:  Well-appearing  Chest:   Clear to auscultation with symmetrical breath sounds  CV:   Regular rate and rhythm without murmur  Incisions:  Healing nicely  Abdomen:  Soft nontender  Extremities:  Warm and well-perfused, no edema  Diagnostic Tests:  CHEST - 2 VIEW  COMPARISON:  08/05/2019 and 07/29/2019  FINDINGS: Interval decrease in the small left pleural effusion. Tiny residual right effusion has also slightly diminished.  Chronic cardiomegaly. Pulmonary vascularity is normal. Lungs are clear.  No acute bone abnormality.  Evidence of previous tricuspid valve repair. Clip on the left atrial appendage.  IMPRESSION: Interval decrease in the bilateral pleural effusions, now small on the left and tiny on the right. Chronic cardiomegaly.   Electronically Signed   By: Lorriane Shire M.D.   On: 08/12/2019 15:53   Impression:  Patient is doing well approximately 33-month status post minimally invasive tricuspid valve repair and Maze procedure.  Follow-up chest x-ray today looks quite good with trivial bilateral pleural effusions.  Plan:  We have not recommended any change the patient's current medications.  The patient will return to our office for routine visit and rhythm check in approximately 4 weeks.  All questions answered.    Valentina Gu. Roxy Manns, MD 08/12/2019 4:24 PM

## 2019-08-12 NOTE — Patient Instructions (Signed)
Continue all previous medications without any changes at this time  Check your weight on a regular basis and keep a log for your records.  Look for signs of fluid overload such as worsening swelling of your lower legs, increased shortness of breath with activity, and/or a dry nonproductive cough.  Discussed these findings with your cardiologist including whether or not you should adjust your fluid pill dosage (diuretic).   

## 2019-08-21 DIAGNOSIS — D649 Anemia, unspecified: Secondary | ICD-10-CM | POA: Diagnosis not present

## 2019-08-21 LAB — CBC
Hematocrit: 28.6 % — ABNORMAL LOW (ref 34.0–46.6)
Hemoglobin: 9.5 g/dL — ABNORMAL LOW (ref 11.1–15.9)
MCH: 28.1 pg (ref 26.6–33.0)
MCHC: 33.2 g/dL (ref 31.5–35.7)
MCV: 85 fL (ref 79–97)
Platelets: 242 10*3/uL (ref 150–450)
RBC: 3.38 x10E6/uL — ABNORMAL LOW (ref 3.77–5.28)
RDW: 14.8 % (ref 11.7–15.4)
WBC: 8.5 10*3/uL (ref 3.4–10.8)

## 2019-08-23 ENCOUNTER — Telehealth: Payer: Self-pay

## 2019-08-23 NOTE — Telephone Encounter (Signed)
-----   Message from Rexene Alberts, MD sent at 08/23/2019  1:27 PM EDT ----- Madaline Brilliant - add her on the PA schedule w/ a CXR ----- Message ----- From: Donnella Sham, RN Sent: 08/23/2019   9:33 AM EDT To: Rexene Alberts, MD  Hey,  She called this morning to state that she is running a temperature in the evenings, highest has been 101.  She is requesting an appointment for Monday.  Please advise.  Thanks,  Caryl Pina

## 2019-08-23 NOTE — Telephone Encounter (Signed)
Patient contacted in regards to appointment for temperature.  Patient aware of appointment made for Monday with chest xray with a PA per Dr. Roxy Manns.

## 2019-08-26 ENCOUNTER — Other Ambulatory Visit: Payer: Self-pay | Admitting: Thoracic Surgery (Cardiothoracic Vascular Surgery)

## 2019-08-26 ENCOUNTER — Other Ambulatory Visit: Payer: Self-pay

## 2019-08-26 ENCOUNTER — Ambulatory Visit
Admission: RE | Admit: 2019-08-26 | Discharge: 2019-08-26 | Disposition: A | Discharge status: Home / Self Care | Payer: Medicare Other | Source: Ambulatory Visit | Attending: Thoracic Surgery (Cardiothoracic Vascular Surgery) | Admitting: Thoracic Surgery (Cardiothoracic Vascular Surgery)

## 2019-08-26 ENCOUNTER — Ambulatory Visit (INDEPENDENT_AMBULATORY_CARE_PROVIDER_SITE_OTHER): Payer: Self-pay | Admitting: Physician Assistant

## 2019-08-26 VITALS — BP 148/80 | HR 100 | Temp 97.7°F | Resp 20 | Ht 65.0 in | Wt 157.0 lb

## 2019-08-26 DIAGNOSIS — R509 Fever, unspecified: Secondary | ICD-10-CM

## 2019-08-26 DIAGNOSIS — Z9889 Other specified postprocedural states: Secondary | ICD-10-CM

## 2019-08-26 DIAGNOSIS — J90 Pleural effusion, not elsewhere classified: Secondary | ICD-10-CM | POA: Diagnosis not present

## 2019-08-26 NOTE — Progress Notes (Signed)
HPI:  Patient returns for complaints of fever in the evenings (up to 101). She is status post minimally invasive tricuspid valve repair, Maze, insertion of temporary Transvenous pacemaker lead on 06/12/2019. procedure on June 12, 2019 for rheumatic heart disease with severe symptomatic tricuspid regurgitation and recurrent persistent atrial fibrillation. Of note, I last saw her in July and she had complaints of fever 99-100.4. A thorough work up was done. Blood culture was negative, pleural fluid was negative, and WBC was slightly increased to 14,500. She was found to have mixed  flora and was put on Keflex 500 mg bid. Of note, she did have a UTI (E. Coli and P. Aeurignosa post op) and was given a one time dose of Fosfomycin, secondary to allergies. She was last seen here in our office on August 17th, 2020 by Dr. Roxy Manns. She denied any fever at that time. Her chest x ray showed small left pleural effusion and trace right pleural effusion. Patient denies chest pain or urinary complaints and states has shortness of breath if "really exerts herself". She is doing most of the housework now, with the exception of vacuuming. She had a CBC done on 08/26 and WBC was 8500 with H and H 9.5 and 28.6.  Current Outpatient Medications  Medication Sig Dispense Refill  . aspirin EC 81 MG EC tablet Take 1 tablet (81 mg total) by mouth daily.    . B Complex Vitamins (B COMPLEX 100 PO) Take 1 tablet by mouth daily.     . calcium carbonate (TUMS - DOSED IN MG ELEMENTAL CALCIUM) 500 MG chewable tablet Chew 1 tablet by mouth daily.     . cholecalciferol (VITAMIN D3) 25 MCG (1000 UT) tablet Take 1,000 Units by mouth daily.    . famotidine (PEPCID) 20 MG tablet Take 20 mg by mouth at bedtime.    . furosemide (LASIX) 40 MG tablet Take 20 mg by mouth daily.     . metoprolol tartrate (LOPRESSOR) 25 MG tablet Take 1 tablet (25 mg total) by mouth 2 (two) times daily. 60 tablet 3  . Multiple Vitamins-Minerals (PRESERVISION  AREDS 2 PO) Take 2 capsules by mouth daily.     . potassium chloride 20 MEQ/15ML (10%) SOLN TAKE 15 MLS (20 MEQ) DAILY FOR 26 DOSES 390 mL 0  . warfarin (COUMADIN) 2.5 MG tablet Take 2 tablets (5 mg total) by mouth daily at 6 PM for 30 days. Take 2 tablets daily or as instructed by [facility]. (Patient taking differently: Take 2.5 mg by mouth daily. ) 60 tablet 11  Vital Signs: BP 148/80, HR 100, RR 20, Oxygenation 97% on room air   Physical Exam  CV-Slightly tachycardic but regular Pulmonary-Clear to auscultation bilaterally Wound-Clean and dry  Diagnostic Tests: CLINICAL DATA:  Post tricuspid valve repair 06/12/2019. No current complaints.  EXAM: CHEST - 2 VIEW  COMPARISON:  08/12/2019 and 08/05/2019.  FINDINGS: There is stable cardiomegaly status post tricuspid valve surgery and left atrial appendage clipping. Small residual left pleural effusion is unchanged. There is no significant right pleural effusion. The lungs are clear. There is no pneumothorax. The bones are unchanged.  IMPRESSION: Stable cardiomegaly and small left pleural effusion. No new findings post tricuspid surgery.   Electronically Signed   By: Richardean Sale M.D.   On: 08/26/2019 14:08  Impression and Plan: She is doing very well s/p minimally invasive tricuspid valve repair and MAZE. We had a discussion about her fever. She has no other complaints and latest WBC  is normal at 8500. Her chest x ray did not show evidence of atelectasis or pneumonia;only a small left pleural effusion. She is going to monitor her temperature over the next week. She will contact her medical doctor if continues to have fever, as no evidence of surgical infection or etiology at this time. She has a follow up to see cardiology on 09/14. INR on 08/07 was 2.1. Her next PT/INR appointment is scheduled for 09/18. As discussed with Dr. Roxy Manns, she will return to see him in 3 months.     Nani Skillern, PA-C [facility] [phone]

## 2019-09-05 ENCOUNTER — Encounter: Payer: Self-pay | Admitting: Neurology

## 2019-09-06 ENCOUNTER — Encounter: Payer: Self-pay | Admitting: Neurology

## 2019-09-06 ENCOUNTER — Ambulatory Visit (INDEPENDENT_AMBULATORY_CARE_PROVIDER_SITE_OTHER): Payer: Medicare Other | Admitting: Neurology

## 2019-09-06 ENCOUNTER — Other Ambulatory Visit: Payer: Self-pay

## 2019-09-06 VITALS — BP 118/70 | HR 100 | Ht 60.0 in | Wt 161.0 lb

## 2019-09-06 DIAGNOSIS — I2510 Atherosclerotic heart disease of native coronary artery without angina pectoris: Secondary | ICD-10-CM

## 2019-09-06 DIAGNOSIS — R278 Other lack of coordination: Secondary | ICD-10-CM | POA: Diagnosis not present

## 2019-09-06 DIAGNOSIS — G629 Polyneuropathy, unspecified: Secondary | ICD-10-CM

## 2019-09-06 DIAGNOSIS — G250 Essential tremor: Secondary | ICD-10-CM

## 2019-09-06 NOTE — Patient Instructions (Signed)
I will get in touch with you after I get information whether propranolol will be a good option for your tremor

## 2019-09-06 NOTE — Progress Notes (Signed)
  Follow-up Visit   Date: 09/06/19   Adriana Hendricks MRN: [id number] DOB: 01/21/1935   Interim History: Adriana Hendricks is a 84 y.o. right-handed Caucasian female with history of GERD, hypertension, s/p MAZE procedure and minimally invasive tricuspid valve repair (2020) and left CTS s/p release (2014) returning for follow-up of neuropathy and new complaints of bilateral hand tremor.  The patient was accompanied by self.  History of present illness: She has previously been evaluated for left carpal tunnel syndrome and underwent release in 2014 which improved left hand paresthesias.  She has a long history of bilateral feet numbness, with work-up showing idiopathic neuropathy.  Previously, she has also complained of infrequent ocular migraines, described as jagged appearance with loss of central vision in her eyes, lasting 45 minutes.  She did not tolerate topiramate and found that symptoms improved with using polarized classes.  UPDATE 09/06/2019:  She is here with new complaints of tremors.  She underwent MAZE procedure and tricuspid valve repair in June.  Following the surgery, she began having worsening tremors which would interefere with her daily activities involving fine motor movements, such as feeding, putting jewelry/make-up on, and buttoning.  Over the past few weeks, the tremors are less intense, but still not back to her usual baseline.  Prior to her surgery she was on nadolol 40 mg, which was stopped and transition to metoprolol 25 mg twice daily.  She has noticed worsening numbness in the feet.  Her balance continues to be a problem, however she is very careful and has not suffered any falls.  She does walk with a cane.  Medications:  Current Outpatient Medications on File Prior to Visit  Medication Sig Dispense Refill  . aspirin EC 81 MG EC tablet Take 1 tablet (81 mg total) by mouth daily.    . B Complex Vitamins (B COMPLEX 100 PO) Take 1 tablet by mouth daily.     .  calcium carbonate (TUMS - DOSED IN MG ELEMENTAL CALCIUM) 500 MG chewable tablet Chew 1 tablet by mouth daily.     . cholecalciferol (VITAMIN D3) 25 MCG (1000 UT) tablet Take 1,000 Units by mouth daily.    . famotidine (PEPCID) 20 MG tablet Take 20 mg by mouth at bedtime.    . furosemide (LASIX) 40 MG tablet Take 20 mg by mouth daily.     . metoprolol tartrate (LOPRESSOR) 25 MG tablet Take 1 tablet (25 mg total) by mouth 2 (two) times daily. 60 tablet 3  . Multiple Vitamins-Minerals (PRESERVISION AREDS 2 PO) Take 2 capsules by mouth daily.     . potassium chloride 20 MEQ/15ML (10%) SOLN TAKE 15 MLS (20 MEQ) DAILY FOR 26 DOSES 390 mL 0  . warfarin (COUMADIN) 2.5 MG tablet Take 2 tablets (5 mg total) by mouth daily at 6 PM for 30 days. Take 2 tablets daily or as instructed by [facility]. (Patient taking differently: Take 2.5 mg by mouth daily. ) 60 tablet 11   No current facility-administered medications on file prior to visit.     Allergies:  Allergies  Allergen Reactions  . Levofloxacin Other (See Comments)    Developed tendon pain. (Has a history of a Achilles tendon tear)  . Potassium Chloride Hives  . Clarithromycin Nausea Only  . Erythromycin Base Nausea And Vomiting  . Metronidazole Rash  . Penicillins Rash and Other (See Comments)    Did it involve swelling of the face/tongue/throat, SOB, or low BP? No Did it involve sudden   or severe rash/hives, skin peeling, or any reaction on the inside of your mouth or nose? No Did you need to seek medical attention at a hospital or doctor's office? No When did it last happen? as a child If all above answers are "NO", may proceed with cephalosporin use.     Review of Systems:  CONSTITUTIONAL: No fevers, chills, night sweats, or weight loss.  EYES: No visual changes or eye pain ENT: No hearing changes.  No history of nose bleeds.   RESPIRATORY: No cough, wheezing and shortness of breath.   CARDIOVASCULAR: Negative for chest pain, and  palpitations.   GI: Negative for abdominal discomfort, blood in stools or black stools.  No recent change in bowel habits.   GU:  No history of incontinence.   MUSCLOSKELETAL: No history of joint pain or swelling.  No myalgias.   SKIN: Negative for lesions, rash, and itching.   ENDOCRINE: Negative for cold or heat intolerance, polydipsia or goiter.   PSYCH:  No depression or anxiety symptoms.   NEURO: As Above.   Vital Signs:  BP 118/70   Pulse 100   Ht 5' (1.524 m)   Wt 161 lb (73 kg)   LMP  (LMP Unknown)   SpO2 95%   BMI 31.44 kg/m    Neurological Exam: MENTAL STATUS including orientation to time, place, person, recent and remote memory, attention span and concentration, language, and fund of knowledge is normal.  Speech is not dysarthric.  CRANIAL NERVES:  No visual field defects.  Pupils equal round and reactive to light.  Normal conjugate, extra-ocular eye movements in all directions of gaze.  No ptosis.  Face is symmetric. Palate elevates symmetrically.  Tongue is midline.  MOTOR:  Motor strength is 5/5 in all extremities.  No atrophy, fasciculations or abnormal movements.  No pronator drift.  Tone is normal.    MSRs:  Reflexes are 3+/4 throughout, except trace at the ankles bilaterally.  SENSATION:  Vibration reduced at the great toes.  Temperature is mildly reduced over the dorsum of the feet.  Pin prick and light touch is intact.  Rhomberg testing is positive.    COORDINATION/GAIT:   Gait mildly wide-based and stable.  She can perform stressed gait, but is very unsteady with tandem gait.    Data:  Labs 09/02/2013:  Ceruloplasmin 43, copper 171, fT4 0.98, fT3 2.6, SPEP/UPEP with IFE, 2hr GTT 100*/121/123 Labs 07/05/2013:  Vitamin B12 1225, HbA1c 6.0, TSH 1.69 Labs 06/12/2015:  ESR 11, TSH 2.24  EMG left arm 09/04/2013:  Left median neuropathy at or distal to the wrist (consistent with carpal tunnel syndrome), mild in degree electrically, based the prolongation of the median  sensory distal latencies.   IMPRESSION/PLAN:  1.  Benign essential tremor, worse She is currently not on medication for tremor.  Options include beta blocker or primidone. Unable to use primidone due to drug-drug interaction with warfarin.  I suspect that her tremors were better controlled when she was on nadolol and now that she is on metoprolol 26m BID, she is getting less benefit. I will contact her cardiologist, Dr. HEllyn Hack to see if it would be possible to add propranolol or go back to nadolol which is more effective than metoprolol.   2.  Idiopathic peripheral neuropathy with sensory ataxia, stable.  Exam remains stable with distal sensory loss and moderate-severe sensory ataxia.  Prior work-up has been extensive and excluded treatable causes I discussed the natural course of neuropathy and that it tends to  be slowly progressive, especially if a treatable etiology is not identified.   There is no role for medications, as she predominately has numbness PT for balance declined at this time, as she is already in cardiac rehab program   Patient educated on daily foot inspection, fall prevention, and safety precautions around the home.  Greater than 50% of this 25 minute visit was spent in counseling, explanation of diagnosis, planning of further management, and coordination of care.   Thank you for allowing me to participate in patient's care.  If I can answer any additional questions, I would be pleased to do so.    Sincerely,    Donika K. Patel, DO  

## 2019-09-08 NOTE — Progress Notes (Signed)
Cardiology Office Note   Date:  09/09/2019   ID:  Adriana Hendricks, DOB May 17, 1935, MRN [id number]  PCP:  Marin Olp, MD  Cardiologist:   No chief complaint on file.    History of Present Illness: Adriana Hendricks is a 84 y.o. female who presents for ongoing assessment and management of rheumatic heart disease status post invasive tricuspid valve repair and maze procedure with LAA clipping on June 12, 2019, persistent atrial fibrillation.  Recurrent pleural effusions, left greater than right.  She also has a history of chronic iron deficiency anemia and has been started on iron replacement therapy by her primary care physician.  When I saw her last on 08/05/2019 she had worsening shortness of breath, her temperature was increasing to 99.8, along with generalized fatigue.  Her daughter, who is a Equities trader, states that these are her symptoms when she begins to have fluid accumulation and pleural effusion worsening.  A repeat chest x-ray was ordered for reevaluation.  Chest x-ray revealed slight increased opacity at the lung left base consistent with possible reaccumulation of pleural effusion versus increased atelectasis.  She was seen by Dr. Vernon Prey on 08/12/2019 with repeat chest x-ray.  The follow-up chest x-ray per Dr. Roxy Manns did not reveal any worsening pleural effusion.  There were no changes in her medication management at that time.  She did follow-up with Lars Pinks, PA on 08/26/2019.  She had complaints of fever in the evenings up to 101.  She was to follow-up with her PCP if fever persisted.  There was no evidence of surgical infection or pleural effusion.  She was seen by Dr. Posey Pronto, neurologist, on 09/06/2019.  At that time she had worsening tremors.  She had been on nadolol but was transitioned to metoprolol through our office.  It was recommended by neurology that she be placed back on nadolol or add propanolol which was more effective in treating tremors.  She sometimes gets  short of breath with exertion and feels HR elevation. Heart rate usually settles down if she takes her time.   Past Medical History:  Diagnosis Date  . Achilles tendon rupture   . Arthritis of hand, right   . Atrial flutter with rapid ventricular response ([place])   . Carpal tunnel syndrome 09/04/2013  . Collagenous colitis 2005  . Colon polyps 2010   Tubular adenoma and hyperplastic  . Dental infection 10/25/2014  . Diverticulosis   . Esophageal stenosis   . Essential tremor   . GERD (gastroesophageal reflux disease)    hx hiatal hernia, hx esophagitis, hx stricture  . Hiatal hernia   . Hypertension   . Lower extremity neuropathy 08/23/2013   On B12 therapy with numbness in the feet bilaterally no evidence of diabetes   . Ocular migraine    jagged vision, a few per month  . OSA (obstructive sleep apnea) 12/21/2016   on CPAP  . Peripheral neuropathy    treated by Dr Posey Pronto (07/2015)  . Persistent atrial fibrillation    Rate control with beta-blocker and anticoagulated with warfarin  . Pleural effusion, left   . S/P Maze operation for atrial fibrillation 06/12/2019   Complete bilateral atrial lesion set using cryothermy and bipolar radiofrequency ablation with clipping of LA appendage via right mini thoracotomy approach  . S/P minimally invasive tricuspid valve repair 06/12/2019   Complex valvuloplasty including autologous pericardial patch augmentation of anterior and posterior leaflets with 28 mm Edwards mc3 ring annuloplasty via right mini thoracotomy approach  .  Severe tricuspid regurgitation 07/2018   Noted Aug 2019 during a fib workup. Severe LAE as well  . Sinus node dysfunction [facility])     Past Surgical History:  Procedure Laterality Date  . 48 hr Holter Monitor  07/2018   Persistent Afib (rate 33 - 124 bpm)  . APPENDECTOMY    . CARDIAC CATHETERIZATION    . CARDIOVERSION N/A 10/02/2018   Procedure: CARDIOVERSION;  Surgeon: Acie Fredrickson, Wonda Cheng, MD;  Location: [facility] ENDOSCOPY;   Service: Cardiovascular;  Laterality: N/A;  . CARPAL TUNNEL RELEASE    . CATARACT EXTRACTION    . EXCISION MORTON'S NEUROMA     Right foot  . EYE SURGERY     Cataracts with implants-bilateral  . INTRAOCULAR LENS IMPLANT, SECONDARY    . IR THORACENTESIS ASP PLEURAL SPACE W/IMG GUIDE  07/10/2019  . IR THORACENTESIS ASP PLEURAL SPACE W/IMG GUIDE  07/10/2019  . IR THORACENTESIS ASP PLEURAL SPACE W/IMG GUIDE  07/26/2019  . MINIMALLY INVASIVE MAZE PROCEDURE N/A 06/12/2019   Procedure: MINIMALLY INVASIVE MAZE PROCEDURE;  Surgeon: Rexene Alberts, MD;  Location: [place];  Service: Open Heart Surgery;  Laterality: N/A;  . MINIMALLY INVASIVE TRICUSPID VALVE REPAIR Right 06/12/2019   Procedure: MINIMALLY INVASIVE TRICUSPID VALVE REPAIR USING EDWARDS MC3 T28 ANNULOPLASTY RING, INSERTION TEMPORARY TRANSVENOUS AV PACING LEAD;  Surgeon: Rexene Alberts, MD;  Location: [place];  Service: Open Heart Surgery;  Laterality: Right;  . Moles removed    . MOUTH SURGERY     (For Exostosis)  . NUCLEAR  07/2018   EF 71 %. LOW RISK - no ischemia or Infarct.   Marland Kitchen RIGHT/LEFT HEART CATH AND CORONARY ANGIOGRAPHY N/A 01/16/2019   Procedure: RIGHT/LEFT HEART CATH AND CORONARY ANGIOGRAPHY;  Surgeon: Leonie Man, MD;  Location: [place] CV LAB;; Angiographically normal coronary arteries.  Normal LVEDP. Upper Limit of Normal PAP~23 mmHg & PCWP 18 mmHg.  LVEDP of 7 mmHg. -With mean PAP of 23 mmHg there is a TPG suggesting a primary pulmonary etiology.  Marland Kitchen ROTATOR CUFF REPAIR    . TEE WITHOUT CARDIOVERSION N/A 01/16/2019   Procedure: TRANSESOPHAGEAL ECHOCARDIOGRAM (TEE);  Surgeon: Sueanne Margarita, MD;  Location: [facility] ENDOSCOPY;;  Severe TR due to poor coaptation of the leaflets from annular dilation.  Mild to moderate mitral vegetation with mildly restricted mobility of the posterior leaflet.  EF 55-60% with no R WMA.  Severe RA and LA dilation.  . TEE WITHOUT CARDIOVERSION N/A 06/12/2019   Procedure: TRANSESOPHAGEAL ECHOCARDIOGRAM  (TEE);  Surgeon: Rexene Alberts, MD;  Location: [place];  Service: Open Heart Surgery;  Laterality: N/A;  . TONSILLECTOMY    . TRANSTHORACIC ECHOCARDIOGRAM  07/2018   Normal LV Fxn (EF 60-65%) no RWMA.  Severe LA dilation & Severe TR.     Current Outpatient Medications  Medication Sig Dispense Refill  . amoxicillin (AMOXIL) 500 MG capsule Take 1 capsule (500 mg total) by mouth once for 1 dose. For dental work only 4 capsule 0  . aspirin EC 81 MG EC tablet Take 1 tablet (81 mg total) by mouth daily.    . B Complex Vitamins (B COMPLEX 100 PO) Take 1 tablet by mouth daily.     . calcium carbonate (TUMS - DOSED IN MG ELEMENTAL CALCIUM) 500 MG chewable tablet Chew 1 tablet by mouth daily.     . cholecalciferol (VITAMIN D3) 25 MCG (1000 UT) tablet Take 1,000 Units by mouth daily.    . famotidine (PEPCID) 20 MG tablet Take  20 mg by mouth at bedtime.    . ferrous sulfate 325 (65 FE) MG EC tablet Take 325 mg by mouth daily with breakfast.    . furosemide (LASIX) 40 MG tablet Take 20 mg by mouth daily.     . metoprolol tartrate (LOPRESSOR) 25 MG tablet Take 25 mg by mouth as needed.    . Multiple Vitamins-Minerals (PRESERVISION AREDS 2 PO) Take 2 capsules by mouth daily.     . nadolol (CORGARD) 40 MG tablet Take 1 tablet (40 mg total) by mouth daily. 90 tablet 3  . potassium chloride 20 MEQ/15ML (10%) SOLN TAKE 15 MLS (20 MEQ) DAILY FOR 26 DOSES 390 mL 0  . warfarin (COUMADIN) 2.5 MG tablet Take 2 tablets (5 mg total) by mouth daily at 6 PM for 30 days. Take 2 tablets daily or as instructed by [facility]mad[facility] Clinic. (Patient taking differently: Take 2.5 mg by mouth daily. ) 60 tablet 11   No current facility-administered medications for this visit.     Allergies:   Levofloxacin, Potassium chloride, Clarithromycin, Erythromycin base, Metronidazole, and Penicillins    Social History:  The patient  reports that she quit smoking about 51 years ago. Her smoking use included cigarettes. She started smoking  about 64 years ago. She has a 26.00 pack-year smoking history. She has never used smokeless tobacco. She reports current alcohol use of about 2.0 standard drinks of alcohol per week. She reports that she does not use drugs.   Family History:  The patient's family history includes Barrett's esophagus in her son; Heart failure in her father; Other in her mother; Rectal cancer in her father.    ROS: All other systems are reviewed and negative. Unless otherwise mentioned in H&P    PHYSICAL EXAM: VS:  BP (!) 147/87   Pulse (!) 109   Ht 5' (1.524 m)   Wt 162 lb (73.5 kg)   LMP  (LMP Unknown)   SpO2 98%   BMI 31.64 kg/m  , BMI Body mass index is 31.64 kg/m. GEN: Well nourished, well developed, in no acute distress HEENT: normal Neck: no JVD, carotid bruits, or masses Cardiac: RRR; (tachyardia paroxysmal(). no murmurs, rubs, or gallops,no edema  Respiratory:  Clear to auscultation bilaterally, normal work of breathing GI: soft, nontender, nondistended, + BS MS: no deformity or atrophy Skin: warm and dry, no rash Neuro:  Strength and sensation are intact, essential tremor.  Psych: euthymic mood, full affect   EKG:  Sinus tachycardia ate of 105 bpm, possible LAA, RSR pattern in V1.   Recent Labs: 06/10/2019: ALT 25 06/16/2019: Magnesium 2.0 06/26/2019: TSH 3.440 07/18/2019: BUN 11; Creatinine, Ser 0.87; Potassium 4.0; Sodium 136 08/21/2019: Hemoglobin 9.5; Platelets 242    Lipid Panel    Component Value Date/Time   CHOL 191 09/18/2018 1401   TRIG 106.0 09/18/2018 1401   HDL 53.70 09/18/2018 1401   CHOLHDL 4 09/18/2018 1401   VLDL 21.2 09/18/2018 1401   LDLCALC 116 (H) 09/18/2018 1401   LDLDIRECT 128.8 01/17/2011 0941      Wt Readings from Last 3 Encounters:  09/09/19 162 lb (73.5 kg)  09/06/19 161 lb (73 kg)  08/26/19 157 lb (71.2 kg)      Other studies Reviewed: CXR 08/12/2019 There is stable cardiomegaly status post tricuspid valve surgery and left atrial appendage  clipping. Small residual left pleural effusion is unchanged. There is no significant right pleural effusion. The lungs are clear. There is no pneumothorax. The bones are unchanged.  IMPRESSION: Stable cardiomegaly and small left pleural effusion. No new findings post tricuspid surgery.  Echocardiogram 08/05/2019 . The left ventricle has normal systolic function with an ejection fraction of 60-65%. The cavity size was normal. There is moderately increased left ventricular wall thickness. Left ventricular diastolic Doppler parameters are consistent with  restrictive filling. Elevated left atrial and left ventricular end-diastolic pressures.  2. The right ventricle has normal systolic function. The cavity was normal. There is no increase in right ventricular wall thickness.  3. The mitral valve is Rheumatic. Moderate thickening and mild calcification of the anterior mitral valve leaflet with mild diastolic doming and the posterior leaflet appears to have restricted motion. There is mild to moderate mitral regurgitation.  There is moderate mitral stenosis with a mean MVA gradient of 6mmHg and MVA (VTI) of 1.31cm2.  4. There is mild dilatation at the level of the sinuses of Valsalva and of the ascending aorta measuring 41 mm and 63mm respectively.  5. The aortic valve is bicuspid. Moderate sclerosis of the aortic valve but no restricted motion of the cusps. Aortic valve regurgitation was not present.  6. Left atrial size was mildly dilated.  7. S/P tricuspid valve repair. There is mid to moderate tricuspid valve regurgitation.  8. Small pericardial effusion.  9. The pericardial effusion is posterior to the left ventricle. 10. The interatrial septum appears to be lipomatous.  ASSESSMENT AND PLAN:  1. Tricuspid Valve Repair with MAZE procedure and LAA clipping 05/2019:  She continues to have paroxysms of rapid HR.  I actually auscultated this during assessment. She is symptomatic with dyspnea    I  have spoken with Dr. [name] on stie today concerning transition from metoprolol ot nadolol or propanolol. He recommends that she begin nadolol 40 mg at HS.  She will use metoprolol prn for rapid HR.  Due to frequency of episodes of rapid HR, I will place a 2-Day Zo cardiac monitor for evaluation of frequency and rate. She will see Dr. [name]  on follow up appointment in October.   2. Recurrent pleural effusion: Followed by Dr, Roxy Manns.  Last CXR was negative for effusion. Some atelectasis was noted.   3. Iron deficiency anemia:  On replacement per PCP   Current medicines are reviewed at length with the patient today.    Labs/ tests ordered today include: 3 Day Zio monitor  Phill Myron. West Pugh, ANP, AACC   09/09/2019 2:59 PM    [address] Office [phone] Fax [phone]

## 2019-09-09 ENCOUNTER — Other Ambulatory Visit: Payer: Self-pay | Admitting: Thoracic Surgery (Cardiothoracic Vascular Surgery)

## 2019-09-09 ENCOUNTER — Encounter: Payer: Self-pay | Admitting: Adult Health

## 2019-09-09 ENCOUNTER — Ambulatory Visit (INDEPENDENT_AMBULATORY_CARE_PROVIDER_SITE_OTHER): Payer: Medicare Other | Admitting: Adult Health

## 2019-09-09 ENCOUNTER — Telehealth: Payer: Self-pay | Admitting: *Deleted

## 2019-09-09 ENCOUNTER — Ambulatory Visit: Payer: Medicare Other | Admitting: Thoracic Surgery (Cardiothoracic Vascular Surgery)

## 2019-09-09 ENCOUNTER — Other Ambulatory Visit: Payer: Self-pay

## 2019-09-09 VITALS — BP 147/87 | HR 109 | Ht 60.0 in | Wt 162.0 lb

## 2019-09-09 DIAGNOSIS — I2510 Atherosclerotic heart disease of native coronary artery without angina pectoris: Secondary | ICD-10-CM

## 2019-09-09 DIAGNOSIS — Z9889 Other specified postprocedural states: Secondary | ICD-10-CM

## 2019-09-09 DIAGNOSIS — I480 Paroxysmal atrial fibrillation: Secondary | ICD-10-CM | POA: Diagnosis not present

## 2019-09-09 DIAGNOSIS — I10 Essential (primary) hypertension: Secondary | ICD-10-CM

## 2019-09-09 DIAGNOSIS — I471 Supraventricular tachycardia: Secondary | ICD-10-CM | POA: Diagnosis not present

## 2019-09-09 MED ORDER — NADOLOL 40 MG PO TABS: 40.0000 mg | ORAL_TABLET | Freq: Every day | ORAL | 3 refills | Status: DC

## 2019-09-09 MED ORDER — AMOXICILLIN 500 MG PO CAPS: 500.0000 mg | ORAL_CAPSULE | Freq: Once | ORAL | 0 refills | Status: AC

## 2019-09-09 NOTE — Telephone Encounter (Signed)
Call and spoke with Rep at [place] about pt Amoxicillin

## 2019-09-09 NOTE — Patient Instructions (Addendum)
Medication Instructions:  START- Nadolol 40 mg by mouth daily TAKE- Metoprolol(Lopressor) as needed  If you need a refill on your cardiac medications before your next appointment, please call your pharmacy.  Labwork: None Ordered   Testing/Procedures: Your physician has recommended that you wear a 3 day Zio Patch monitor. Holter monitors are medical devices that record the heart's electrical activity. Doctors most often use these monitors to diagnose arrhythmias. Arrhythmias are problems with the speed or rhythm of the heartbeat. The monitor is a small, portable device. You can wear one while you do your normal daily activities. This is usually used to diagnose what is causing palpitations/syncope (passing out).  Follow-Up: . Your physician recommends that you schedule a follow-up appointment in: Keep appointment with Dr [name] October 9th @ 1:40 pm   At [facility], you and your health needs are our priority.  As part of our continuing mission to provide you with exceptional heart care, we have created designated Provider Care Teams.  These Care Teams include your primary Cardiologist (physician) and Advanced Practice Providers (APPs -  Physician Assistants and Nurse Practitioners) who all work together to provide you with the care you need, when you need it.  Thank you for choosing [facility] at [facility]!!

## 2019-09-09 NOTE — Telephone Encounter (Signed)
Heather from [employer] left a msg on the refill vm requesting a call back at [phone] in regards to the rx that was sent in today for amoxicillin. She is questioning the sig. Thanks, MI

## 2019-09-11 ENCOUNTER — Ambulatory Visit: Payer: Medicare Other | Admitting: Neurology

## 2019-09-12 DIAGNOSIS — H43813 Vitreous degeneration, bilateral: Secondary | ICD-10-CM | POA: Diagnosis not present

## 2019-09-12 DIAGNOSIS — H04123 Dry eye syndrome of bilateral lacrimal glands: Secondary | ICD-10-CM | POA: Diagnosis not present

## 2019-09-12 DIAGNOSIS — H02055 Trichiasis without entropian left lower eyelid: Secondary | ICD-10-CM | POA: Diagnosis not present

## 2019-09-12 DIAGNOSIS — H353131 Nonexudative age-related macular degeneration, bilateral, early dry stage: Secondary | ICD-10-CM | POA: Diagnosis not present

## 2019-09-13 ENCOUNTER — Ambulatory Visit (INDEPENDENT_AMBULATORY_CARE_PROVIDER_SITE_OTHER): Payer: Medicare Other | Admitting: Pharmacist Clinician (PhC)/ Clinical Pharmacy Specialist

## 2019-09-13 ENCOUNTER — Other Ambulatory Visit: Payer: Self-pay

## 2019-09-13 DIAGNOSIS — I4819 Other persistent atrial fibrillation: Secondary | ICD-10-CM

## 2019-09-13 DIAGNOSIS — Z7901 Long term (current) use of anticoagulants: Secondary | ICD-10-CM | POA: Diagnosis not present

## 2019-09-13 LAB — POCT INR: INR: 1.7 — AB (ref 2.0–3.0)

## 2019-09-13 NOTE — Addendum Note (Signed)
Addended by: Diana Eves on: 09/13/2019 04:10 PM   Modules accepted: Orders

## 2019-09-16 ENCOUNTER — Other Ambulatory Visit: Payer: Self-pay | Admitting: Thoracic Surgery (Cardiothoracic Vascular Surgery)

## 2019-09-16 ENCOUNTER — Telehealth: Payer: Self-pay | Admitting: *Deleted

## 2019-09-16 NOTE — Telephone Encounter (Signed)
3 day ZIO XT long term holter monitor to be mailed to the patients home.  Instructions reviewed briefly as they are included in the monitor kit. 

## 2019-09-17 ENCOUNTER — Other Ambulatory Visit: Payer: Self-pay

## 2019-09-18 ENCOUNTER — Other Ambulatory Visit: Payer: Self-pay

## 2019-09-18 MED ORDER — POTASSIUM CHLORIDE 20 MEQ/15ML (10%) PO SOLN: ORAL | 0 refills | Status: DC

## 2019-09-20 ENCOUNTER — Encounter (HOSPITAL_COMMUNITY)
Admission: RE | Admit: 2019-09-20 | Discharge: 2019-09-20 | Disposition: A | Discharge status: Home / Self Care | Payer: Self-pay | Source: Ambulatory Visit | Attending: Cardiology | Admitting: Cardiology

## 2019-09-20 ENCOUNTER — Ambulatory Visit (INDEPENDENT_AMBULATORY_CARE_PROVIDER_SITE_OTHER): Payer: Medicare Other

## 2019-09-20 ENCOUNTER — Other Ambulatory Visit: Payer: Self-pay

## 2019-09-20 DIAGNOSIS — I471 Supraventricular tachycardia: Secondary | ICD-10-CM

## 2019-09-20 NOTE — Progress Notes (Signed)
Patient called to say she's been unable to login on the app since an update to her iPad OS. I let patient know that I will contact the app developer as I am unable to assist with this technical issue. I will follow up with patient. Patient also mentioned that prior to bein unable to login on the app, she had noticed a few of her exercise sessions being "dropped" or disappearing after logging those sessions.  Sol Passer, MS, ACSM CEP

## 2019-10-03 DIAGNOSIS — I471 Supraventricular tachycardia: Secondary | ICD-10-CM | POA: Diagnosis not present

## 2019-10-04 ENCOUNTER — Encounter: Payer: Self-pay | Admitting: Cardiology

## 2019-10-04 ENCOUNTER — Ambulatory Visit (INDEPENDENT_AMBULATORY_CARE_PROVIDER_SITE_OTHER): Payer: Medicare Other | Admitting: Pharmacist Clinician (PhC)/ Clinical Pharmacy Specialist

## 2019-10-04 ENCOUNTER — Ambulatory Visit (INDEPENDENT_AMBULATORY_CARE_PROVIDER_SITE_OTHER): Payer: Medicare Other | Admitting: Cardiology

## 2019-10-04 ENCOUNTER — Other Ambulatory Visit: Payer: Self-pay

## 2019-10-04 VITALS — BP 130/74 | HR 73 | Temp 98.1°F | Ht 65.0 in | Wt 162.0 lb

## 2019-10-04 DIAGNOSIS — I4892 Unspecified atrial flutter: Secondary | ICD-10-CM | POA: Diagnosis not present

## 2019-10-04 DIAGNOSIS — Z9889 Other specified postprocedural states: Secondary | ICD-10-CM | POA: Diagnosis not present

## 2019-10-04 DIAGNOSIS — I2510 Atherosclerotic heart disease of native coronary artery without angina pectoris: Secondary | ICD-10-CM

## 2019-10-04 DIAGNOSIS — G250 Essential tremor: Secondary | ICD-10-CM

## 2019-10-04 DIAGNOSIS — Z8679 Personal history of other diseases of the circulatory system: Secondary | ICD-10-CM | POA: Diagnosis not present

## 2019-10-04 DIAGNOSIS — I071 Rheumatic tricuspid insufficiency: Secondary | ICD-10-CM

## 2019-10-04 DIAGNOSIS — R0600 Dyspnea, unspecified: Secondary | ICD-10-CM

## 2019-10-04 DIAGNOSIS — I051 Rheumatic mitral insufficiency: Secondary | ICD-10-CM

## 2019-10-04 DIAGNOSIS — I10 Essential (primary) hypertension: Secondary | ICD-10-CM

## 2019-10-04 DIAGNOSIS — Z7901 Long term (current) use of anticoagulants: Secondary | ICD-10-CM | POA: Diagnosis not present

## 2019-10-04 DIAGNOSIS — R0609 Other forms of dyspnea: Secondary | ICD-10-CM

## 2019-10-04 DIAGNOSIS — I480 Paroxysmal atrial fibrillation: Secondary | ICD-10-CM

## 2019-10-04 DIAGNOSIS — I4819 Other persistent atrial fibrillation: Secondary | ICD-10-CM | POA: Diagnosis not present

## 2019-10-04 LAB — POCT INR: INR: 1.4 — AB (ref 2.0–3.0)

## 2019-10-04 NOTE — Progress Notes (Signed)
PCP: Marin Olp, MD  Clinic Note: Chief Complaint  Patient presents with   Follow-up    monitor  / echo   Cardiac Valve Problem    s/p TVR with Mild-MR & Mod MS, functionally bicuspid AoV   Atrial Fibrillation     HPI:   Adriana Hendricks is a 84 y.o. female with a h/o severe rheumatic tricuspid regurgitation complicated by persistent atrial fibrillation.  She is now s/p TVR with MAZE & LAA clipping (June 12, 2019).  This was initially complicated by recurrent pleural effusions requiring thoracentesis.  She had recurrent A. fib/flutter postoperatively, but converted prior to planned DCCV (scheduled for 07/19/2019).  She now presents  presents today for ~ 1 month f/u after recent NP visit.  Since her last visit with me in July, Adriana Hendricks has been seen twice by Jory Sims, NP.    Recent clinic visits: She was seen on August 10 --> symptoms of worsening shortness of breath similar to prior pleural effusion. -->  Had low-grade fever, but denies any chest pain or pressure.  Was noted to be in sinus rhythm. --> Recheck PA and lateral chest x-ray and sent back to Dr. Roxy Manns.  Consideration for possible redo thoracentesis.  Noted to have iron deficiency anemia and was to be starting iron supplementation.  Thankfully, by the time she saw Dr. Roxy Manns, the effusion was improved and her dyspnea symptoms had improved as well.  No further fevers (seen on 8/31 - by PA).  Next visit was on 09/09/2019: Neurologist asked about potentially converting BB from Metoprolol to Propranolol or Nadolol for worsening tremor --> transitioned to Nadolol.  She noted that her heart rate would go up sometimes with activity, but was settled down nicely.  No real recurrence of A. fib.  Some exertional dyspnea but not much.  No hospitalizations.  Reviewed  CV studies:   The following studies were reviewed today: (if available, images/films reviewed: From Epic Chart or Care Everywhere)  7/13 - CXR with  large L pleural effusion & small R pleural effusion, minimal Pulmonary vascular congetsion ? IR: L sided Thoracentecis (1L) (07/10/2019)  F/u CXR - No PTX. Reduction is L Pleural effusion with improved aeration of L Lung base - some patchy atelectasis.  Echo (TTE) August 05, 2019: EF 60 to 65%. Mod concentric LVH. Grade III DD (?  With mildly dilated left atrium).  Rheumatic mitral valve with moderate thickening and calcification.  Anterior leaflet has mild doming but no true prolapse.  Mild-moderate MR and mild to moderate MS (mean MVA gradient 7 mmHg).  Likely functional bicuspid aortic valve with moderate sclerosis.  No significant stenosis.  Mild dilation of the aorta at the sinus Valsalva and ascending aorta (4.1 & 3.7 cm).  S/p TVR with mild-mod TR.  Monitor ordered - not ready to be read.   Interval history:    Adriana Hendricks is here today for close follow-up unfortunately we do not have the results of her monitor yet to read.  She indicates that she has been quite stressed lately as the caregiver for her husband who seems to be having worsening dementia.  She has been working very hard to try to figure out how to get there groceries ordered and delivered.  There has been a change in the delivery and this is caused quite a bit of issue for her.  She feels a little bit of heart skipping as a result, but has not felt rapid irregular heartbeats or  palpitations.  She still notes that she is a little bit short of breath but does in general feel better than she did last year at this time and then leading up to her surgery.  I think she is now quite deconditioned and therefore has not really gotten herself into full level of activity.  No more fevers or chills.  No PND orthopnea.  She has not had any chest pain or pressure with rest or exertion.  No syncope/near syncope or TIA/amaurosis fugax.  No melena, hematochezia, hematuria, or epstaxis. No claudication.   Reviewed Of Systems   ROS: A  comprehensive was performed. Review of Systems  Constitutional: Positive for malaise/fatigue (Still gradually building energy back). Negative for weight loss.  HENT: Negative for congestion.   Respiratory: Positive for shortness of breath (Gradually getting better).   Cardiovascular: Negative for leg swelling.  Gastrointestinal: Negative for constipation, heartburn (Except when she tries to take her iron pill) and melena (Stool is dark from iron pill).  Genitourinary: Negative for hematuria.  Musculoskeletal: Negative for falls and joint pain.  Neurological: Positive for dizziness (Little bit of unsteady gait.  Is gradually building her strength back.) and weakness (Just overall feels little bit weak).  Psychiatric/Behavioral: Negative for memory loss. The patient is nervous/anxious.        Social stress noted in HPI  All other systems reviewed and are negative.    I have reviewed and (if needed) personally updated the patient's problem list, medications, allergies, past medical and surgical history, social and family history.   Past Medical History   Past Medical History:  Diagnosis Date   Achilles tendon rupture    Arthritis of hand, right    Atrial flutter with rapid ventricular response ([place])    Carpal tunnel syndrome 09/04/2013   Collagenous colitis 2005   Colon polyps 2010   Tubular adenoma and hyperplastic   Dental infection 10/25/2014   Diverticulosis    Esophageal stenosis    Essential tremor    GERD (gastroesophageal reflux disease)    hx hiatal hernia, hx esophagitis, hx stricture   Hiatal hernia    Hypertension    Lower extremity neuropathy 08/23/2013   On B12 therapy with numbness in the feet bilaterally no evidence of diabetes    Ocular migraine    jagged vision, a few per month   OSA (obstructive sleep apnea) 12/21/2016   on CPAP   Peripheral neuropathy    treated by Dr Posey Pronto (07/2015)   Persistent atrial fibrillation ([place])    Rate control  with beta-blocker and anticoagulated with warfarin   Pleural effusion, left    S/P Maze operation for atrial fibrillation 06/12/2019   Complete bilateral atrial lesion set using cryothermy and bipolar radiofrequency ablation with clipping of LA appendage via right mini thoracotomy approach   S/P minimally invasive tricuspid valve repair 06/12/2019   Complex valvuloplasty including autologous pericardial patch augmentation of anterior and posterior leaflets with 28 mm Edwards mc3 ring annuloplasty via right mini thoracotomy approach   Severe tricuspid regurgitation 07/2018   Noted Aug 2019 during a fib workup. Severe LAE as well   Sinus node dysfunction [facility])     Past Surgical History   Past Surgical History:  Procedure Laterality Date   48 hr Holter Monitor  07/2018   Persistent Afib (rate 33 - 124 bpm)   APPENDECTOMY     CARDIAC CATHETERIZATION     CARDIOVERSION N/A 10/02/2018   Procedure: CARDIOVERSION;  Surgeon: Thayer Headings, MD;  Location: [facility];  Service: Cardiovascular;  Laterality: N/A;   CARPAL TUNNEL RELEASE     CATARACT EXTRACTION     EXCISION MORTON'S NEUROMA     Right foot   EYE SURGERY     Cataracts with implants-bilateral   INTRAOCULAR LENS IMPLANT, SECONDARY     IR THORACENTESIS ASP PLEURAL SPACE W/IMG GUIDE  07/10/2019   IR THORACENTESIS ASP PLEURAL SPACE W/IMG GUIDE  07/10/2019   IR THORACENTESIS ASP PLEURAL SPACE W/IMG GUIDE  07/26/2019   MINIMALLY INVASIVE MAZE PROCEDURE N/A 06/12/2019   Procedure: MINIMALLY INVASIVE MAZE PROCEDURE;  Surgeon: Rexene Alberts, MD;  Location: [place];  Service: Open Heart Surgery;  Laterality: N/A;   MINIMALLY INVASIVE TRICUSPID VALVE REPAIR Right 06/12/2019   Procedure: MINIMALLY INVASIVE TRICUSPID VALVE REPAIR USING EDWARDS MC3 T28 ANNULOPLASTY RING, INSERTION TEMPORARY TRANSVENOUS AV PACING LEAD;  Surgeon: Rexene Alberts, MD;  Location: [place];  Service: Open Heart Surgery;  Laterality: Right;   Moles  removed     MOUTH SURGERY     (For Exostosis)   NUCLEAR  07/2018   EF 71 %. LOW RISK - no ischemia or Infarct.    RIGHT/LEFT HEART CATH AND CORONARY ANGIOGRAPHY N/A 01/16/2019   Procedure: RIGHT/LEFT HEART CATH AND CORONARY ANGIOGRAPHY;  Surgeon: Leonie Man, MD;  Location: [place] [facility];; Angiographically normal coronary arteries.  Normal LVEDP. Upper Limit of Normal PAP~23 mmHg & PCWP 18 mmHg.  LVEDP of 7 mmHg. -With mean PAP of 23 mmHg there is a TPG suggesting a primary pulmonary etiology.   ROTATOR CUFF REPAIR     TEE WITHOUT CARDIOVERSION N/A 01/16/2019   Procedure: TRANSESOPHAGEAL ECHOCARDIOGRAM (TEE);  Surgeon: Sueanne Margarita, MD;  Location: [facility];;  Severe TR due to poor coaptation of the leaflets from annular dilation.  Mild to moderate mitral vegetation with mildly restricted mobility of the posterior leaflet.  EF 55-60% with no R WMA.  Severe RA and LA dilation.   TEE WITHOUT CARDIOVERSION N/A 06/12/2019   Procedure: TRANSESOPHAGEAL ECHOCARDIOGRAM (TEE);  Surgeon: Rexene Alberts, MD;  Location: [place];  Service: Open Heart Surgery;  Laterality: N/A;   TONSILLECTOMY     TRANSTHORACIC ECHOCARDIOGRAM  08/05/2019   First postop TAVR: EF 60 to 65%. Mod concentric LVH. Grade III DD (? w/ Mild LA dilation).  Rheumatic MV w/ mod thickening & Ca2+.  Anterior leaflet has mild doming but no MVP.  Mild-mod MR and mild to mod MS (mean MVA gradient 7 mmHg).  ~ functional bicuspid AoV w/ Mod Sclerosis - no AS.  Thoracic Aorta (4.1 & 3.7 @ Sinus of  V, prox Ascending) - mild dilation   TRANSTHORACIC ECHOCARDIOGRAM  07/2018   Normal LV Fxn (EF 60-65%) no RWMA.  Severe LA dilation & Severe TR.    Medications/Allergies   Current Meds  Medication Sig   aspirin EC 81 MG EC tablet Take 1 tablet (81 mg total) by mouth daily.   B Complex Vitamins (B COMPLEX 100 PO) Take 1 tablet by mouth daily.    calcium carbonate (TUMS - DOSED IN MG ELEMENTAL CALCIUM) 500 MG chewable tablet  Chew 1 tablet by mouth daily.    cholecalciferol (VITAMIN D3) 25 MCG (1000 UT) tablet Take 1,000 Units by mouth daily.   famotidine (PEPCID) 20 MG tablet Take 20 mg by mouth at bedtime.   ferrous sulfate 325 (65 FE) MG EC tablet Take 325 mg by mouth daily with breakfast.   furosemide (LASIX) 40 MG  tablet Take 20 mg by mouth daily.    metoprolol tartrate (LOPRESSOR) 25 MG tablet Take 25 mg by mouth as needed.   Multiple Vitamins-Minerals (PRESERVISION AREDS 2 PO) Take 2 capsules by mouth daily.    nadolol (CORGARD) 40 MG tablet Take 1 tablet (40 mg total) by mouth daily.   potassium chloride 20 MEQ/15ML (10%) SOLN TAKE 15 MLS (20 MEQ) DAILY FOR 26 DOSES    Allergies  Allergen Reactions   Levofloxacin Other (See Comments)    Developed tendon pain. (Has a history of a Achilles tendon tear)   Potassium Chloride Hives   Clarithromycin Nausea Only   Erythromycin Base Nausea And Vomiting   Metronidazole Rash   Penicillins Rash and Other (See Comments)    Did it involve swelling of the face/tongue/throat, SOB, or low BP? No Did it involve sudden or severe rash/hives, skin peeling, or any reaction on the inside of your mouth or nose? No Did you need to seek medical attention at a hospital or doctor's office? No When did it last happen? as a child If all above answers are "NO", may proceed with cephalosporin use.      Social History/Family History   Social History   Tobacco Use   Smoking status: Former Smoker    Packs/day: 2.00    Years: 13.00    Pack years: 26.00    Types: Cigarettes    Start date: 12/26/1954    Quit date: 03/26/1968    Years since quitting: 51.5   Smokeless tobacco: Never Used   Tobacco comment: Quit in 1969 - smokeed 2-3PPD , was 3 PPD at her heaviest for about 3 years.   Substance Use Topics   Alcohol use: Yes    Alcohol/week: 2.0 standard drinks    Types: 2 Glasses of wine per week    Comment: 2 glass of wine per day   Drug use: No    Social History   Social History Narrative   Married. Husband is patient of Dr. Thurnell Garbe   Married 1958. 2 children. 4 grandkids (3 girls, 1 boy).    Retired from [employer].   Hobbies: genealogy and DNA   Right handed    One story    family history includes Barrett's esophagus in her son; Heart failure in her father; Other in her mother; Rectal cancer in her father.  Wt Readings from Last 3 Encounters:  10/04/19 162 lb (73.5 kg)  09/09/19 162 lb (73.5 kg)  09/06/19 161 lb (73 kg)   Objctive   Physical Exam: BP 130/74    Pulse 73    Temp 98.1 F (36.7 C)    Ht 5\' 5"  (1.651 m)    Wt 162 lb (73.5 kg)    LMP  (LMP Unknown)    SpO2 97%    BMI 26.96 kg/m  Physical Exam  Constitutional: She is oriented to person, place, and time. She appears well-developed and well-nourished.  Healthy-appearing.  Well-groomed  HENT:  Head: Normocephalic and atraumatic.  Neck: Normal range of motion. Neck supple. No hepatojugular reflux and no JVD present. Carotid bruit is not present.  Cardiovascular: Intact distal pulses and normal pulses. An irregular rhythm present.  Occasional extrasystoles are present. PMI is not displaced. Exam reveals no gallop, no distant heart sounds and no friction rub.  Murmur (Soft HSM at base and apex.  Cannot exclude soft SEM as well.) heard. Pulmonary/Chest: Effort normal and breath sounds normal. No respiratory distress. She  has no wheezes. She has no rales.  Abdominal: Soft. Bowel sounds are normal. She exhibits no distension. There is no abdominal tenderness. There is no rebound.  No HSM.  Musculoskeletal: Normal range of motion.        General: Edema (Trivial) present.  Neurological: She is alert and oriented to person, place, and time.  Psychiatric: She has a normal mood and affect. Her behavior is normal. Judgment and thought content normal.  Vitals reviewed.   Adult ECG Report  Rate: 71 ;  Rhythm: normal sinus rhythm, sinus  arrhythmia and Significant sinus arrhythmia with rate ranging from 71 to 85 bpm.  LAE.  Incomplete RBBB.  ~T wave inversions in inferior leads.;   Narrative Interpretation: Significant sinus arrhythmia noted.  Difficult to assess PR interval as there appears to be more short little bursts of PAT with inspiration.  Recent Labs: No new labs since September   Assessment / Plan    Problem List Items Addressed This Visit    Rheumatic mitral regurgitation (Chronic)    Actually rheumatic mitral stenosis and regurgitation.  Probably need to follow this up at least annually.  Does not appear to be at the extent needed for surgery.  However it was not commented on previously on transthoracic echoes.  I do not think that much focus was made on the mitral valve.  It is possible with improved forward cardiac output with the tricuspid repair, that now the mitral valve is becoming more prominent . Would probably recheck an echocardiogram next year.      Paroxysmal atrial flutter (HCC) (Chronic)    Intermittently in and out of a flutter and fib.  I suspect that she probably is more in A. fib than flutter but has had maze.  The could be flutter from surgical scar.  Did not require cardioversion.  We switched from metoprolol to nadolol which has notably improved her tremor.  She has PRN metoprolol. Is on warfarin for her valve and A. fib.      Essential tremor (Chronic)    Doing much better now on nadolol.      Essential hypertension (Chronic)    Blood pressure looks pretty good now back on nadolol.  Is taking Lasix now 1/2 tablet daily.  We will stop it take him PRN for additional swelling or dyspnea.      DOE (dyspnea on exertion) (Chronic)    Still has some exertional dyspnea but I think a lot of this is now deconditioning.  She needs to start becoming more active.      Tricuspid valve insufficiency (Chronic)    Recent TVR -->  Looks good on echo.      S/P minimally invasive tricuspid valve  repair   S/P Maze operation for atrial fibrillation    Other Visit Diagnoses    Paroxysmal atrial fibrillation ([place])    -  Primary   Relevant Orders   EKG 12-Lead      I spent a total of 22  minutes with the patient and chart review. >  50% of the time was spent in direct patient consultation.  Additional time spent with chart review (studies, outside notes, etc): 10 Total Time: 32 min   Current medicines are reviewed at length with the patient today.  (+/- concerns) n/a   Patient Instructions / Medication Changes & Studies & Tests Ordered   Patient Instructions  Medication Instructions:  No changes  If you need a refill on your cardiac medications before your  next appointment, please call your pharmacy.   Lab work: Not needed   Testing/Procedures: Not needed  Follow-Up: At [facility], you and your health needs are our priority.  As part of our continuing mission to provide you with exceptional heart care, we have created designated Provider Care Teams.  These Care Teams include your primary Cardiologist (physician) and Advanced Practice Providers (APPs -  Physician Assistants and Nurse Practitioners) who all work together to provide you with the care you need, when you need it.  You will need a follow up appointment in  4  Months- Feb 2021.  Please call our office 2 months in advance to schedule this appointment.  You may see Adriana Hew, MD or one of the following Advanced Practice Providers on your designated Care Team:    Rosaria Ferries, PA-C  Jory Sims, DNP, ANP  Any Other Special Instructions Will Be Listed Below (If Applicable).  Studies Ordered:   Orders Placed This Encounter  Procedures   EKG 12-Lead      Adriana Hendricks, M.D., M.S. Interventional Cardiologist   Pager # [phone] Phone # [phone] [address] [place], [place] [address]   Thank you for choosing Heartcare at [facility]!!

## 2019-10-04 NOTE — Patient Instructions (Addendum)
Medication Instructions:  No changes  If you need a refill on your cardiac medications before your next appointment, please call your pharmacy.   Lab work: Not needed   Testing/Procedures: Not needed  Follow-Up: At [facility], you and your health needs are our priority.  As part of our continuing mission to provide you with exceptional heart care, we have created designated Provider Care Teams.  These Care Teams include your primary Cardiologist (physician) and Advanced Practice Providers (APPs -  Physician Assistants and Nurse Practitioners) who all work together to provide you with the care you need, when you need it. . You will need a follow up appointment in  4  Months- Feb 2021.  Please call our office 2 months in advance to schedule this appointment.  You may see Glenetta Hew, MD or one of the following Advanced Practice Providers on your designated Care Team:   . Rosaria Ferries, PA-C . Jory Sims, DNP, ANP  Any Other Special Instructions Will Be Listed Below (If Applicable).

## 2019-10-07 ENCOUNTER — Encounter: Payer: Self-pay | Admitting: Cardiology

## 2019-10-07 NOTE — Assessment & Plan Note (Signed)
Blood pressure looks pretty good now back on nadolol.  Is taking Lasix now 1/2 tablet daily.  We will stop it take him PRN for additional swelling or dyspnea.

## 2019-10-07 NOTE — Assessment & Plan Note (Addendum)
Intermittently in and out of a flutter and fib.  I suspect that she probably is more in A. fib than flutter but has had maze.  The could be flutter from surgical scar.  Did not require cardioversion.  We switched from metoprolol to nadolol which has notably improved her tremor.  She has PRN metoprolol. Is on warfarin for her valve and A. fib.

## 2019-10-07 NOTE — Assessment & Plan Note (Signed)
Still has some exertional dyspnea but I think a lot of this is now deconditioning.  She needs to start becoming more active.

## 2019-10-07 NOTE — Assessment & Plan Note (Addendum)
Actually rheumatic mitral stenosis and regurgitation.  Probably need to follow this up at least annually.  Does not appear to be at the extent needed for surgery.  However it was not commented on previously on transthoracic echoes.  I do not think that much focus was made on the mitral valve.  It is possible with improved forward cardiac output with the tricuspid repair, that now the mitral valve is becoming more prominent . Would probably recheck an echocardiogram next year.

## 2019-10-07 NOTE — Assessment & Plan Note (Signed)
Status post MAZE with LAA clipping.  Was clearly in A. fib when I saw her last.  Very intolerant when she goes into it.  For now we will do PRN metoprolol for new onset, however would like to consider the possibility of a pill in the pocket option however is difficult with her valvular heart disease.  She also has had bradycardia with amiodarone leaving that is a poor option.  Continue warfarin for anticoagulation.

## 2019-10-07 NOTE — Assessment & Plan Note (Signed)
Doing much better now on nadolol.

## 2019-10-07 NOTE — Assessment & Plan Note (Signed)
Recent TVR -->  Looks good on echo.

## 2019-10-08 ENCOUNTER — Ambulatory Visit (INDEPENDENT_AMBULATORY_CARE_PROVIDER_SITE_OTHER): Payer: Medicare Other

## 2019-10-08 ENCOUNTER — Encounter: Payer: Self-pay | Admitting: Family Medicine

## 2019-10-08 ENCOUNTER — Other Ambulatory Visit: Payer: Self-pay

## 2019-10-08 DIAGNOSIS — Z23 Encounter for immunization: Secondary | ICD-10-CM

## 2019-10-10 ENCOUNTER — Other Ambulatory Visit: Payer: Self-pay | Admitting: Cardiology

## 2019-10-10 ENCOUNTER — Other Ambulatory Visit: Payer: Self-pay | Admitting: Physician Assistant

## 2019-10-16 ENCOUNTER — Other Ambulatory Visit: Payer: Self-pay | Admitting: Cardiology

## 2019-10-16 ENCOUNTER — Other Ambulatory Visit: Payer: Self-pay | Admitting: Physician Assistant

## 2019-10-18 ENCOUNTER — Ambulatory Visit (INDEPENDENT_AMBULATORY_CARE_PROVIDER_SITE_OTHER): Payer: Medicare Other | Admitting: Pharmacist Clinician (PhC)/ Clinical Pharmacy Specialist

## 2019-10-18 ENCOUNTER — Encounter: Payer: Self-pay | Admitting: Thoracic Surgery (Cardiothoracic Vascular Surgery)

## 2019-10-18 ENCOUNTER — Other Ambulatory Visit: Payer: Self-pay | Admitting: *Deleted

## 2019-10-18 ENCOUNTER — Other Ambulatory Visit: Payer: Self-pay

## 2019-10-18 ENCOUNTER — Other Ambulatory Visit: Payer: Self-pay | Admitting: Pharmacist Clinician (PhC)/ Clinical Pharmacy Specialist

## 2019-10-18 DIAGNOSIS — I4819 Other persistent atrial fibrillation: Secondary | ICD-10-CM | POA: Diagnosis not present

## 2019-10-18 DIAGNOSIS — Z7901 Long term (current) use of anticoagulants: Secondary | ICD-10-CM

## 2019-10-18 LAB — POCT INR: INR: 2.6 (ref 2.0–3.0)

## 2019-10-18 MED ORDER — FUROSEMIDE 40 MG PO TABS: 20.0000 mg | ORAL_TABLET | Freq: Every day | ORAL | 2 refills | Status: DC

## 2019-10-18 MED ORDER — WARFARIN SODIUM 2.5 MG PO TABS: ORAL_TABLET | ORAL | 1 refills | Status: DC

## 2019-10-18 NOTE — Telephone Encounter (Signed)
Rx has been sent to the pharmacy electronically. ° °

## 2019-10-28 ENCOUNTER — Telehealth: Payer: Self-pay | Admitting: Family Medicine

## 2019-10-28 NOTE — Telephone Encounter (Signed)
I called the patient to schedule AWV with Loma Sousa, but she declined. VDM (Dee-Dee)

## 2019-11-08 ENCOUNTER — Encounter: Payer: Self-pay | Admitting: Cardiology

## 2019-11-08 ENCOUNTER — Other Ambulatory Visit: Payer: Self-pay

## 2019-11-08 ENCOUNTER — Ambulatory Visit (INDEPENDENT_AMBULATORY_CARE_PROVIDER_SITE_OTHER): Payer: Medicare Other | Admitting: Cardiology

## 2019-11-08 VITALS — BP 152/87 | HR 62 | Ht 65.0 in | Wt 162.0 lb

## 2019-11-08 DIAGNOSIS — J90 Pleural effusion, not elsewhere classified: Secondary | ICD-10-CM

## 2019-11-08 DIAGNOSIS — I10 Essential (primary) hypertension: Secondary | ICD-10-CM | POA: Diagnosis not present

## 2019-11-08 DIAGNOSIS — I051 Rheumatic mitral insufficiency: Secondary | ICD-10-CM | POA: Diagnosis not present

## 2019-11-08 DIAGNOSIS — I4819 Other persistent atrial fibrillation: Secondary | ICD-10-CM

## 2019-11-08 DIAGNOSIS — R002 Palpitations: Secondary | ICD-10-CM

## 2019-11-08 DIAGNOSIS — G250 Essential tremor: Secondary | ICD-10-CM | POA: Diagnosis not present

## 2019-11-08 DIAGNOSIS — Z9889 Other specified postprocedural states: Secondary | ICD-10-CM

## 2019-11-08 DIAGNOSIS — I2510 Atherosclerotic heart disease of native coronary artery without angina pectoris: Secondary | ICD-10-CM | POA: Diagnosis not present

## 2019-11-08 NOTE — Progress Notes (Signed)
Primary Care Provider: Marin Olp, MD Cardiologist: Glenetta Hew, MD Electrophysiologist:   Clinic Note: Chief Complaint  Patient presents with   Follow-up    Discuss test results   Cardiac Valve Problem    Status post TAVR   Atrial Fibrillation    Status post MAZE    HPI:    Adriana Hendricks is a 84 y.o. female with a HISTORY OF SEVERE RHEUMATIC TRICUSPID REGURGITATION-complicated by PERSISTENT A. FIB (s/p TVR-MAZE&LAA Clipping -June 2020) who presents today for monthly follow-up to discuss results of her recent event monitor.  Adriana Hendricks was last seen on October 07, 2019 --> she stated that she had switched from metoprolol to nadolol.  She had had to follow-up issues postop with concerns of pleural effusion. --Issue for her was the complications of how to figure out how to get her groceries etc. delivered home doing all the ordering with a new company. --> She did notice his heart skipping off and on, but no prolonged symptoms.  Still noted off-and-on shortness of breath but felt much better than she did before.  Recent Hospitalizations: none  Reviewed  CV studies:    The following studies were reviewed today: (if available, images/films reviewed: From Epic Chart or Care Everywhere)  Event Monitor:  Mostly sinus rhythm with frequent bursts of PAT/SVT.  No signs of A. fib/flutter or ventricular tachycardia. No significant pauses.  While in sinus rhythm: Minimum HR 47 bpm, max HR 85 bpm, average 62 bpm.  For the most part, less than 1%/minimal PACs and PVCs.  293 episodes of SVT/PAT: Fastest interval 7 beats, max rate 141. Longest 10 beats at 94 bpm.   Interval History:   Adriana Hendricks returns today overall actually feeling quite well.  She says she is just about back to where she would like to be as far as energy level and dyspnea.  Palpitations have notably improved.  She has not had any prolonged episodes of irregular heartbeats or palpitations.  Has not  had nearly the amount of fatigue and dyspnea that she had had before.  No chest pain or pressure.  No use of additional metoprolol.  Also notes the tremor is doing much better with her having sweating a lot. She tells me that her blood pressure is usually much better than it is here today.  She was somewhat stressed out getting to her appointment today.  Overall she says that her strength is coming back, less dizzy with better balance.  CV Review of Symptoms (Summary)  positive for - Still has mild exertional dyspnea, but doing a whole lot more than she used to. negative for - chest pain, edema, irregular heartbeat, orthopnea, palpitations, paroxysmal nocturnal dyspnea, rapid heart rate, shortness of breath or Syncope/near syncope, TIA/amaurosis fugax.  The patient does not have symptoms concerning for COVID-19 infection (fever, chills, cough, or new shortness of breath).  The patient is practicing social distancing. ++ Masking.  She does all of her groceries/shopping online which is much of a source of consternation for her.    REVIEWED OF SYSTEMS   A comprehensive ROS was performed. Review of Systems  Constitutional: Negative for malaise/fatigue and weight loss.  HENT: Negative for congestion and nosebleeds.   Respiratory: Negative for shortness of breath (Nothing like she had been feeling before.) and wheezing.   Cardiovascular: Negative for leg swelling.  Gastrointestinal: Negative for blood in stool and melena.  Genitourinary: Negative for hematuria.  Musculoskeletal: Negative for falls and joint  pain.  Neurological: Negative for dizziness, focal weakness and weakness.  Psychiatric/Behavioral: Negative for memory loss. The patient is not nervous/anxious and does not have insomnia.    I have reviewed and (if needed) personally updated the patient's problem list, medications, allergies, past medical and surgical history, social and family history.   PAST MEDICAL HISTORY   Past  Medical History:  Diagnosis Date   Achilles tendon rupture    Arthritis of hand, right    Atrial flutter with rapid ventricular response ([place])    Carpal tunnel syndrome 09/04/2013   Collagenous colitis 2005   Colon polyps 2010   Tubular adenoma and hyperplastic   Dental infection 10/25/2014   Diverticulosis    Esophageal stenosis    Essential tremor    GERD (gastroesophageal reflux disease)    hx hiatal hernia, hx esophagitis, hx stricture   Hiatal hernia    Hypertension    Lower extremity neuropathy 08/23/2013   On B12 therapy with numbness in the feet bilaterally no evidence of diabetes    Ocular migraine    jagged vision, a few per month   OSA (obstructive sleep apnea) 12/21/2016   on CPAP   Peripheral neuropathy    treated by Dr Posey Pronto (07/2015)   Persistent atrial fibrillation ([place])    Rate control with beta-blocker and anticoagulated with warfarin   Pleural effusion, left    S/P Maze operation for atrial fibrillation 06/12/2019   Complete bilateral atrial lesion set using cryothermy and bipolar radiofrequency ablation with clipping of LA appendage via right mini thoracotomy approach   S/P minimally invasive tricuspid valve repair 06/12/2019   Complex valvuloplasty including autologous pericardial patch augmentation of anterior and posterior leaflets with 28 mm Edwards mc3 ring annuloplasty via right mini thoracotomy approach   Severe tricuspid regurgitation 07/2018   Noted Aug 2019 during a fib workup. Severe LAE as well   Sinus node dysfunction ([facility])     PAST SURGICAL HISTORY   Past Surgical History:  Procedure Laterality Date   48 hr Holter Monitor  07/2018   Persistent Afib (rate 33 - 124 bpm)   APPENDECTOMY     CARDIAC CATHETERIZATION     CARDIOVERSION N/A 10/02/2018   Procedure: CARDIOVERSION;  Surgeon: Acie Fredrickson Wonda Cheng, MD;  Location: [place];  Service: Cardiovascular;  Laterality: N/A;   CARPAL TUNNEL RELEASE     CATARACT  EXTRACTION     EXCISION MORTON'S NEUROMA     Right foot   EYE SURGERY     Cataracts with implants-bilateral   INTRAOCULAR LENS IMPLANT, SECONDARY     IR THORACENTESIS ASP PLEURAL SPACE W/IMG GUIDE  07/10/2019   IR THORACENTESIS ASP PLEURAL SPACE W/IMG GUIDE  07/10/2019   IR THORACENTESIS ASP PLEURAL SPACE W/IMG GUIDE  07/26/2019   MINIMALLY INVASIVE MAZE PROCEDURE N/A 06/12/2019   Procedure: MINIMALLY INVASIVE MAZE PROCEDURE;  Surgeon: Rexene Alberts, MD;  Location: [place];  Service: Open Heart Surgery;  Laterality: N/A;   MINIMALLY INVASIVE TRICUSPID VALVE REPAIR Right 06/12/2019   Procedure: MINIMALLY INVASIVE TRICUSPID VALVE REPAIR USING EDWARDS MC3 T28 ANNULOPLASTY RING, INSERTION TEMPORARY TRANSVENOUS AV PACING LEAD;  Surgeon: Rexene Alberts, MD;  Location: [place];  Service: Open Heart Surgery;  Laterality: Right;   Moles removed     MOUTH SURGERY     (For Exostosis)   NUCLEAR  07/2018   EF 71 %. LOW RISK - no ischemia or Infarct.    RIGHT/LEFT HEART CATH AND CORONARY ANGIOGRAPHY N/A 01/16/2019  Procedure: RIGHT/LEFT HEART CATH AND CORONARY ANGIOGRAPHY;  Surgeon: Leonie Man, MD;  Location: [place] [facility];; Angiographically normal coronary arteries.  Normal LVEDP. Upper Limit of Normal PAP~23 mmHg & PCWP 18 mmHg.  LVEDP of 7 mmHg. -With mean PAP of 23 mmHg there is a TPG suggesting a primary pulmonary etiology.   ROTATOR CUFF REPAIR     TEE WITHOUT CARDIOVERSION N/A 01/16/2019   Procedure: TRANSESOPHAGEAL ECHOCARDIOGRAM (TEE);  Surgeon: Sueanne Margarita, MD;  Location[facility] ENDOS[facility];;  Severe TR due to poor coaptation of the leaflets from annular dilation.  Mild to moderate mitral vegetation with mildly restricted mobility of the posterior leaflet.  EF 55-60% with no R WMA.  Severe RA and LA dilation.   TEE WITHOUT CARDIOVERSION N/A 06/12/2019   Procedure: TRANSESOPHAGEAL ECHOCARDIOGRAM (TEE);  Surgeon: Rexene Alberts, MD;  Location: [place];  Service: Open Heart  Surgery;  Laterality: N/A;   TONSILLECTOMY     TRANSTHORACIC ECHOCARDIOGRAM  08/05/2019   First postop TAVR: EF 60 to 65%. Mod concentric LVH. Grade III DD (? w/ Mild LA dilation).  Rheumatic MV w/ mod thickening & Ca2+.  Anterior leaflet has mild doming but no MVP.  Mild-mod MR and mild to mod MS (mean MVA gradient 7 mmHg).  ~ functional bicuspid AoV w/ Mod Sclerosis - no AS.  Thoracic Aorta (4.1 & 3.7 @ Sinus of  V, prox Ascending) - mild dilation   TRANSTHORACIC ECHOCARDIOGRAM  08/02/2018   Normal LV Fxn (EF 60-65%) no RWMA.  Severe LA dilation & Severe TR.    MEDICATIONS/ALLERGIES   Current Meds  Medication Sig   aspirin EC 81 MG EC tablet Take 1 tablet (81 mg total) by mouth daily.   B Complex Vitamins (B COMPLEX 100 PO) Take 1 tablet by mouth daily.    calcium carbonate (TUMS - DOSED IN MG ELEMENTAL CALCIUM) 500 MG chewable tablet Chew 1 tablet by mouth daily.    cholecalciferol (VITAMIN D3) 25 MCG (1000 UT) tablet Take 1,000 Units by mouth daily.   famotidine (PEPCID) 20 MG tablet Take 20 mg by mouth at bedtime.   ferrous sulfate 325 (65 FE) MG EC tablet Take 325 mg by mouth daily with breakfast.   furosemide (LASIX) 40 MG tablet Take 0.5 tablets (20 mg total) by mouth daily.   metoprolol tartrate (LOPRESSOR) 25 MG tablet Take 25 mg by mouth as needed.   Multiple Vitamins-Minerals (PRESERVISION AREDS 2 PO) Take 2 capsules by mouth daily.    nadolol (CORGARD) 40 MG tablet Take 1 tablet (40 mg total) by mouth daily.   potassium chloride 20 MEQ/15ML (10%) SOLN TAKE 15 ML BY MOUTH  ONCE DAILY FOR  26  DOSES   warfarin (COUMADIN) 2.5 MG tablet Take 1 to 1.5 tablets by mouth daily or as instructed by [facility].    Allergies  Allergen Reactions   Levofloxacin Other (See Comments)    Developed tendon pain. (Has a history of a Achilles tendon tear)   Potassium Chloride Hives   Clarithromycin Nausea Only   Erythromycin Base Nausea And Vomiting   Metronidazole  Rash   Penicillins Rash and Other (See Comments)    Did it involve swelling of the face/tongue/throat, SOB, or low BP? No Did it involve sudden or severe rash/hives, skin peeling, or any reaction on the inside of your mouth or nose? No Did you need to seek medical attention at a hospital or doctor's office? No When did it last happen? as a  child If all above answers are "NO", may proceed with cephalosporin use.     SOCIAL HISTORY/FAMILY HISTORY   Social History   Tobacco Use   Smoking status: Former Smoker    Packs/day: 2.00    Years: 13.00    Pack years: 26.00    Types: Cigarettes    Start date: 12/26/1954    Quit date: 03/26/1968    Years since quitting: 51.6   Smokeless tobacco: Never Used   Tobacco comment: Quit in 1969 - smokeed 2-3PPD , was 3 PPD at her heaviest for about 3 years.   Substance Use Topics   Alcohol use: Yes    Alcohol/week: 2.0 standard drinks    Types: 2 Glasses of wine per week    Comment: 2 glass of wine per day   Drug use: No   Social History   Social History Narrative   Married. Husband is patient of Dr. Thurnell Garbe   Married 1958. 2 children. 4 grandkids (3 girls, 1 boy).    Retired from [employer].   Hobbies: genealogy and DNA   Right handed    One story    Family History family history includes Barrett's esophagus in her son; Heart failure in her father; Other in her mother; Rectal cancer in her father.   OBJCTIVE -PE, EKG, labs   Wt Readings from Last 3 Encounters:  11/08/19 162 lb (73.5 kg)  10/04/19 162 lb (73.5 kg)  09/09/19 162 lb (73.5 kg)    Physical Exam: BP (!) 152/87    Pulse 62    Ht 5\' 5"  (1.651 m)    Wt 162 lb (73.5 kg)    LMP  (LMP Unknown)    SpO2 98%    BMI 26.96 kg/m  Physical Exam  Constitutional: She is oriented to person, place, and time. She appears well-developed and well-nourished. No distress.  Healthy-appearing.  Well-groomed.  HENT:  Head: Normocephalic and atraumatic.   Neck: Normal range of motion. Neck supple. No hepatojugular reflux and no JVD present. Carotid bruit is not present. No tracheal deviation present. No thyromegaly present.  Cardiovascular: Normal rate, regular rhythm, S1 normal, S2 normal and normal pulses.  Occasional extrasystoles are present. PMI is not displaced. Exam reveals no gallop and no friction rub.  Murmur heard. High-pitched blowing holosystolic murmur is present with a grade of 1/6 at the apex. Cannot exclude soft SEM as well. Pulmonary/Chest: Effort normal and breath sounds normal. No respiratory distress. She has no wheezes. She has no rales.  Abdominal: Soft. Bowel sounds are normal. She exhibits no distension. There is no abdominal tenderness.  Musculoskeletal: Normal range of motion.        General: No edema.  Neurological: She is alert and oriented to person, place, and time. No cranial nerve deficit.  Psychiatric: She has a normal mood and affect. Her behavior is normal. Judgment and thought content normal.  Vitals reviewed.    Adult ECG Report N/a  Recent Labs:  No recent labs  Lab Results  Component Value Date   CHOL 191 09/18/2018   HDL 53.70 09/18/2018   LDLCALC 116 (H) 09/18/2018   LDLDIRECT 128.8 01/17/2011   TRIG 106.0 09/18/2018   CHOLHDL 4 09/18/2018   Lab Results  Component Value Date   CREATININE 0.87 07/18/2019   BUN 11 07/18/2019   NA 136 07/18/2019   K 4.0 07/18/2019   CL 95 (L) 07/18/2019   CO2 25 07/18/2019    ASSESSMENT/PLAN  Problem List Items Addressed This Visit    Rheumatic mitral regurgitation (Chronic)    Rheumatic mitral stenosis and regurgitation.      Essential tremor (Chronic)    Doing well having changes beta-blocker to nadolol      Essential hypertension (Chronic)    Blood pressure is up a little bit today which is little unusual for her.  She is on low-dose Lasix plus nadolol.  We will need to watch this closely but this is not a usual high pressure for her.  She  seems to be little bit stressed out today.  No change for now.      Pleural effusion, left    Finally seems to be clearing up.  Breathing better.  Lung sounds are clear.          COVID-19 Education: The signs and symptoms of COVID-19 were discussed with the patient and how to seek care for testing (follow up with PCP or arrange E-visit).   The importance of social distancing was discussed today.  I spent a total of 18 minutes with the patient and chart review. >  50% of the time was spent in direct patient consultation.  Additional time spent with chart review (studies, outside notes, etc): 5 Total Time: 20 min   Current medicines are reviewed at length with the patient today.  (+/- concerns) n/a   Patient Instructions / Medication Changes & Studies & Tests Ordered   Patient Instructions  Medication Instructions:  NO CHANGES  *If you need a refill on your cardiac medications before your next appointment, please call your pharmacy*  Lab Work: NOT NEEDED  Testing/Procedures: NOT NEEDED  Follow-Up: At [facility], you and your health needs are our priority.  As part of our continuing mission to provide you with exceptional heart care, we have created designated Provider Care Teams.  These Care Teams include your primary Cardiologist (physician) and Advanced Practice Providers (APPs -  Physician Assistants and Nurse Practitioners) who all work together to provide you with the care you need, when you need it.  Your next appointment:   6 months  The format for your next appointment:   In Person  Provider:   Glenetta Hew, MD  Other Instructions     Studies Ordered:   No orders of the defined types were placed in this encounter.    Glenetta Hew, M.D., M.S. Interventional Cardiologist   Pager # [phone] Phone # [phone] [address] [place],  [address]   Thank you for choosing Heartcare at [facility]!!

## 2019-11-08 NOTE — Patient Instructions (Signed)
Medication Instructions:  NO CHANGES  *If you need a refill on your cardiac medications before your next appointment, please call your pharmacy*  Lab Work: NOT NEEDED  Testing/Procedures: NOT NEEDED  Follow-Up: At [facility], you and your health needs are our priority.  As part of our continuing mission to provide you with exceptional heart care, we have created designated Provider Care Teams.  These Care Teams include your primary Cardiologist (physician) and Advanced Practice Providers (APPs -  Physician Assistants and Nurse Practitioners) who all work together to provide you with the care you need, when you need it.  Your next appointment:   6 months  The format for your next appointment:   In Person  Provider:   Glenetta Hew, MD  Other Instructions

## 2019-11-10 ENCOUNTER — Encounter: Payer: Self-pay | Admitting: Cardiology

## 2019-11-10 DIAGNOSIS — R002 Palpitations: Secondary | ICD-10-CM | POA: Insufficient documentation

## 2019-11-10 NOTE — Assessment & Plan Note (Signed)
She did have several short bursts of PAT/PSVT noted on her monitor.  These seem to stabilized out now.  Either she is not feeling them as much, or not as worried.  Episodes are lasting anywhere from 7-11 beats.  We will continue current dose of beta-blocker for now as I do not have much room to titrate up further.  Monitor for recurrent symptoms.  She does have as needed beta-blocker.

## 2019-11-10 NOTE — Assessment & Plan Note (Signed)
Status post complicated minimal invasive tricuspid valve repair.  Also noted to have significant mitral valve disease not treated.  Had an echo done this year.  Will determine based on symptoms and exam whether we recheck echocardiogram next year or the following year.

## 2019-11-10 NOTE — Assessment & Plan Note (Signed)
Blood pressure is up a little bit today which is little unusual for her.  She is on low-dose Lasix plus nadolol.  We will need to watch this closely but this is not a usual high pressure for her.  She seems to be little bit stressed out today.  No change for now.

## 2019-11-10 NOTE — Assessment & Plan Note (Addendum)
Doing well having changes beta-blocker to nadolol

## 2019-11-10 NOTE — Assessment & Plan Note (Addendum)
Rheumatic mitral stenosis and regurgitation.  Appears to be stable on current echo.  Follow closely.  Likely recheck echo in 2022.

## 2019-11-10 NOTE — Assessment & Plan Note (Signed)
History of persistent A. fib the complicated her valvular disease.  Now s/p MAZE-LAA clipping, as far as I can tell, no further breakthrough spells.  Remains on nadolol for rate control.   Continue warfarin for anticoagulation mostly because of concern with mitral valve disease as well.  Thankfully, no breakthrough is noted on monitor.

## 2019-11-10 NOTE — Assessment & Plan Note (Signed)
Finally seems to be clearing up.  Breathing better.  Lung sounds are clear.

## 2019-11-15 ENCOUNTER — Other Ambulatory Visit: Payer: Self-pay

## 2019-11-15 ENCOUNTER — Ambulatory Visit (INDEPENDENT_AMBULATORY_CARE_PROVIDER_SITE_OTHER): Payer: Medicare Other | Admitting: Pharmacist Clinician (PhC)/ Clinical Pharmacy Specialist

## 2019-11-15 DIAGNOSIS — I4819 Other persistent atrial fibrillation: Secondary | ICD-10-CM | POA: Diagnosis not present

## 2019-11-15 DIAGNOSIS — Z7901 Long term (current) use of anticoagulants: Secondary | ICD-10-CM

## 2019-11-15 LAB — POCT INR: INR: 2.1 (ref 2.0–3.0)

## 2019-12-02 ENCOUNTER — Ambulatory Visit: Payer: Medicare Other | Admitting: Thoracic Surgery (Cardiothoracic Vascular Surgery)

## 2019-12-14 ENCOUNTER — Other Ambulatory Visit: Payer: Self-pay | Admitting: Cardiology

## 2019-12-16 NOTE — Telephone Encounter (Signed)
Rx(s) sent to pharmacy electronically.  

## 2019-12-24 ENCOUNTER — Encounter (HOSPITAL_COMMUNITY)
Admission: RE | Admit: 2019-12-24 | Discharge: 2019-12-24 | Disposition: A | Discharge status: Home / Self Care | Payer: Self-pay | Source: Ambulatory Visit | Attending: Cardiology | Admitting: Cardiology

## 2019-12-24 ENCOUNTER — Encounter (HOSPITAL_COMMUNITY): Payer: Self-pay | Admitting: *Deleted

## 2019-12-24 NOTE — Progress Notes (Signed)
Patient has completed the virtual cardiac rehab program and will continue exercise independently at this time. Patient is walking 45 minutes, 3-4 days/week and does not have any questions about her exercise routine at this time. Patient's virtual cardiac rehab report will be scanned to media for review.  Sol Passer, MS, ACSM CEP 12/24/2019 [id number]

## 2019-12-30 ENCOUNTER — Telehealth (INDEPENDENT_AMBULATORY_CARE_PROVIDER_SITE_OTHER): Payer: Medicare Other | Admitting: Thoracic Surgery (Cardiothoracic Vascular Surgery)

## 2019-12-30 ENCOUNTER — Other Ambulatory Visit: Payer: Self-pay

## 2019-12-30 ENCOUNTER — Encounter: Payer: Self-pay | Admitting: Family Medicine

## 2019-12-30 DIAGNOSIS — Z8679 Personal history of other diseases of the circulatory system: Secondary | ICD-10-CM

## 2019-12-30 DIAGNOSIS — Z9889 Other specified postprocedural states: Secondary | ICD-10-CM | POA: Diagnosis not present

## 2019-12-30 NOTE — Progress Notes (Signed)
[name]       [place] [address]             [place] VIRTUAL OFFICE NOTE  Referring Provider is Leonie Man, MD Primary Cardiologist is Glenetta Hew, MD PCP is [name], MD   HPI:  I spoke with Adriana Hendricks (DOB [birth year] ) via telephone on 12/30/2019 at 2:32 PM and verified that I was speaking with the correct person using more than one form of identification.  We discussed the reason(s) for conducting our visit virtually instead of in-person.  The patient expressed understanding the circumstances and agreed to proceed as described.  Patient is an 85 year old female with history of hypertension and obstructive sleep apnea on home CPAP who underwent minimally invasive tricuspid valve repair and Maze procedure on June 12, 2023 rheumatic heart disease with severe symptomatic tricuspid regurgitation and recurrent persistent atrial fibrillation.  She was last seen here in our office August 26, 2019 at which time she was doing well although at the time she had a brief febrile illness.  This resolved uneventfully.  Since then she has apparently done quite well.  I spoke with her over the telephone today and she states that she feels dramatically better than she did prior to surgery.  Prior to surgery she had severe exertional shortness of breath with low-level activity.  She is now walking at least 2 or 3 times every day for 45 minutes without any symptoms of exertional shortness of breath.  She states that she feels much better than she did.  She does not experience any palpitations or other symptoms to suggest a recurrence of atrial fibrillation.  She has not had any dizzy spells.  She remains anticoagulated using warfarin.  Overall she is delighted with her outcome.    Current Outpatient Medications  Medication Sig Dispense Refill  . aspirin EC 81 MG EC tablet Take 1 tablet (81 mg total) by mouth daily.    . B  Complex Vitamins (B COMPLEX 100 PO) Take 1 tablet by mouth daily.     . calcium carbonate (TUMS - DOSED IN MG ELEMENTAL CALCIUM) 500 MG chewable tablet Chew 1 tablet by mouth daily.     . cholecalciferol (VITAMIN D3) 25 MCG (1000 UT) tablet Take 1,000 Units by mouth daily.    . famotidine (PEPCID) 20 MG tablet Take 20 mg by mouth at bedtime.    . ferrous sulfate 325 (65 FE) MG EC tablet Take 325 mg by mouth daily with breakfast.    . furosemide (LASIX) 40 MG tablet Take 0.5 tablets (20 mg total) by mouth daily. 45 tablet 2  . metoprolol tartrate (LOPRESSOR) 25 MG tablet Take 25 mg by mouth as needed.    . Multiple Vitamins-Minerals (PRESERVISION AREDS 2 PO) Take 2 capsules by mouth daily.     . nadolol (CORGARD) 40 MG tablet Take 1 tablet (40 mg total) by mouth daily. 90 tablet 3  . potassium chloride 20 MEQ/15ML (10%) SOLN Take 15 mLs (20 mEq total) by mouth daily. TAKE 15 ML BY MOUTH  ONCE DAILY FOR  26  DOSES 390 mL 12  . warfarin (COUMADIN) 2.5 MG tablet Take 1 to 1.5 tablets by mouth daily or as instructed by Coumadin Clinic. 120 tablet 1   No current facility-administered medications for this visit.     Diagnostic Tests:    ECHOCARDIOGRAM REPORT  Patient Name:   Adriana Hendricks Date of Exam: 08/05/2019 Medical Rec #:  [id number]      Height:       65.0 in Accession #:    [id number]     Weight:       160.0 lb Date of Birth:  1935-10-18      BSA:          1.80 m Patient Age:    36 years       BP:           132/80 mmHg Patient Gender: F              HR:           96 bpm. Exam Location:  [facility] [facility]    Procedure: 2D Echo, Cardiac Doppler and Color Doppler  Indications:    I07.1   History:        Patient has prior history of Echocardiogram examinations, most                 recent 08/02/2018. Atrial Fibrillation S/p Tricuspid repair Risk                 Factors: Hypertension, Dyslipidemia and Former Smoker.   Sonographer:    Coralyn Helling RDCS Referring Phys: [place] Comments: Respiratory motion. IMPRESSIONS    1. The left ventricle has normal systolic function with an ejection fraction of 60-65%. The cavity size was normal. There is moderately increased left ventricular wall thickness. Left ventricular diastolic Doppler parameters are consistent with  restrictive filling. Elevated left atrial and left ventricular end-diastolic pressures.  2. The right ventricle has normal systolic function. The cavity was normal. There is no increase in right ventricular wall thickness.  3. The mitral valve is Rheumatic. Moderate thickening and mild calcification of the anterior mitral valve leaflet with mild diastolic doming and the posterior leaflet appears to have restricted motion. There is mild to moderate mitral regurgitation.  There is moderate mitral stenosis with a mean MVA gradient of 48mmHg and MVA (VTI) of 1.31cm2.  4. There is mild dilatation at the level of the sinuses of Valsalva and of the ascending aorta measuring 41 mm and 28mm respectively.  5. The aortic valve is bicuspid. Moderate sclerosis of the aortic valve but no restricted motion of the cusps. Aortic valve regurgitation was not present.  6. Left atrial size was mildly dilated.  7. S/P tricuspid valve repair. There is mid to moderate tricuspid valve regurgitation.  8. Small pericardial effusion.  9. The pericardial effusion is posterior to the left ventricle. 10. The interatrial septum appears to be lipomatous.  FINDINGS  Left Ventricle: The left ventricle has normal systolic function, with an ejection fraction of 60-65%. The cavity size was normal. There is moderately increased left ventricular wall thickness. Left ventricular diastolic Doppler parameters are consistent  with restrictive filling. Elevated left atrial and left ventricular end-diastolic pressures  Right Ventricle: The right ventricle has normal systolic function. The cavity was normal. There is  no increase in right ventricular wall thickness.  Left Atrium: Left atrial size was mildly dilated.  Right Atrium: Right atrial size was normal in size. Right atrial pressure is estimated at 10 mmHg.  Interatrial Septum: No atrial level shunt detected by color flow Doppler. Increased thickness of the atrial septum sparing the fossa ovalis consistent with The interatrial septum appears to be lipomatous.  Pericardium: A small pericardial effusion is present.  The pericardial effusion is posterior to the left ventricle.  Mitral Valve: The mitral valve is rheumatic. Moderate thickening of the anterior mitral valve leaflet. Mild calcification of the anterior mitral valve leaflet. Mitral valve regurgitation is mild to moderate by color flow Doppler. Moderate mitral valve  stenosis. There is diastolic doming of the anterior MV leaflet.  Tricuspid Valve: The tricuspid valve is not well visualized. Tricuspid valve regurgitation is mild-moderate by color flow Doppler. S/P tricuspid valve repair.  Aortic Valve: The aortic valve has an indeterminate number of cusps Moderate sclerosis of the aortic valve. Aortic valve regurgitation was not visualized by color flow Doppler.  Pulmonic Valve: The pulmonic valve was normal in structure. Pulmonic valve regurgitation is trivial by color flow Doppler.  Aorta: The aorta is abnormal in size and structure. There is mild dilatation at the level of the sinuses of Valsalva and of the ascending aorta measuring 41 mm.  Venous: The inferior vena cava is normal in size with greater than 50% respiratory variability.    +--------------+--------++ LEFT VENTRICLE         +----------------+---------++ +--------------+--------++ Diastology                PLAX 2D                +----------------+---------++ +--------------+--------++ LV e' lateral:  5.77 cm/s LVIDd:        3.55 cm  +----------------+---------++ +--------------+--------++ LV  E/e' lateral:31.5      LVIDs:        2.65 cm  +----------------+---------++ +--------------+--------++ LV e' medial:   4.68 cm/s LV PW:        1.30 cm  +----------------+---------++ +--------------+--------++ LV E/e' medial: 38.9      LV IVS:       1.30 cm  +----------------+---------++ +--------------+--------++ LVOT diam:    1.80 cm  +--------------+--------++ LV SV:        27 ml    +--------------+--------++ LV SV Index:  14.59    +--------------+--------++ LVOT Area:    2.54 cm +--------------+--------++                        +--------------+--------++  +---------------+---------++ RIGHT VENTRICLE          +---------------+---------++ RV S prime:    8.70 cm/s +---------------+---------++ TAPSE (M-mode):0.6 cm    +---------------+---------++ RVSP:          49.5 mmHg +---------------+---------++  +---------------+-------++-----------++ LEFT ATRIUM           Index       +---------------+-------++-----------++ LA diam:       3.50 cm1.95 cm/m  +---------------+-------++-----------++ LA Vol (A2C):  62.1 ml34.52 ml/m +---------------+-------++-----------++ LA Vol (A4C):  69.9 ml38.85 ml/m +---------------+-------++-----------++ LA Biplane Vol:68.2 ml37.91 ml/m +---------------+-------++-----------++ +------------+---------++-----------++ RIGHT ATRIUM         Index       +------------+---------++-----------++ RA Pressure:8.00 mmHg            +------------+---------++-----------++ RA Area:    20.40 cm            +------------+---------++-----------++ RA Volume:  62.70 ml 34.85 ml/m +------------+---------++-----------++  +------------+-----------++ AORTIC VALVE            +------------+-----------++ LVOT Vmax:  107.00 cm/s +------------+-----------++ LVOT Vmean: 70.400 cm/s +------------+-----------++ LVOT VTI:   0.184 m      +------------+-----------++   +-------------+-------++ AORTA                +-------------+-------++ Ao Root diam:4.10 cm +-------------+-------++ Ao  Asc diam: 3.70 cm +-------------+-------++  +--------------+----------++  +---------------+-----------++ MITRAL VALVE              TRICUSPID VALVE            +--------------+----------++  +---------------+-----------++ MV Area (PHT):3.54 cm    TR Peak grad:  41.5 mmHg   +--------------+----------++  +---------------+-----------++ MV Peak grad: 14.1 mmHg   TR Vmax:       326.00 cm/s +--------------+----------++  +---------------+-----------++ MV Mean grad: 6.3 mmHg    Estimated RAP: 8.00 mmHg   +--------------+----------++  +---------------+-----------++ MV Vmax:      1.88 m/s    RVSP:          49.5 mmHg   +--------------+----------++  +---------------+-----------++ MV Vmean:     114.3 cm/s +--------------+----------++  +--------------+-------+ MV VTI:       0.36 m      SHUNTS                +--------------+----------++  +--------------+-------+ MV PHT:       62.06 msec  Systemic VTI: 0.18 m  +--------------+----------++  +--------------+-------+ MV Decel Time:214 msec    Systemic Diam:1.80 cm +--------------+----------++  +--------------+-------+ +--------------+-----------++ MV E velocity:182.00 cm/s +--------------+-----------++ MV A velocity:83.20 cm/s  +--------------+-----------++ MV E/A ratio: 2.19        +--------------+-----------++    Fransico Him MD Electronically signed by Fransico Him MD Signature Date/Time: 08/05/2019/5:27:35 PM      Impression:  Patient is doing well more than 6 months status post minimally invasive tricuspid valve repair and Maze procedure.  Last EKG performed October 04, 2019 revealed sinus rhythm.  Follow-up echocardiogram performed last August revealed normal left and right ventricular function with  intact tricuspid valve repair.    Plan:  We have not recommended any change the patient's current medications.  The patient will continue to follow-up regularly with Dr. [name] and return to our office next summer approximately 1 year following her surgery for routine follow-up and rhythm check.  All questions have been answered.  Patient did ask about receiving the COVID-19 vaccine and I answered all of her questions.    I discussed limitations of evaluation and management via telephone.  The patient was advised to call back for repeat telephone consultation or to seek an in-person evaluation if questions arise or the patient's clinical condition changes in any significant manner.  I spent in excess of 10 minutes of non-face-to-face time during the conduct of this telephone virtual office consultation.     Valentina Gu. Roxy Manns, MD 12/30/2019 2:32 PM

## 2019-12-31 ENCOUNTER — Telehealth: Payer: Self-pay | Admitting: Cardiology

## 2019-12-31 NOTE — Telephone Encounter (Signed)
OK to order BMP & CBC - with Iron study  Glenetta Hew, MD

## 2019-12-31 NOTE — Telephone Encounter (Signed)
Patient would like to have lab work done to check Potassium and Iron levels. Please call.

## 2019-12-31 NOTE — Telephone Encounter (Signed)
Will forward to Dr [name] to ask if okay to order BMET and CBC (pt asking for iron levels)./cy

## 2020-01-03 ENCOUNTER — Ambulatory Visit (INDEPENDENT_AMBULATORY_CARE_PROVIDER_SITE_OTHER): Payer: Medicare Other | Admitting: Pharmacist

## 2020-01-03 ENCOUNTER — Other Ambulatory Visit: Payer: Self-pay

## 2020-01-03 DIAGNOSIS — I4819 Other persistent atrial fibrillation: Secondary | ICD-10-CM | POA: Diagnosis not present

## 2020-01-03 DIAGNOSIS — Z7901 Long term (current) use of anticoagulants: Secondary | ICD-10-CM

## 2020-01-03 LAB — POCT INR: INR: 2.3 (ref 2.0–3.0)

## 2020-01-04 ENCOUNTER — Ambulatory Visit: Payer: Medicare Other | Attending: Internal Medicine

## 2020-01-04 DIAGNOSIS — Z23 Encounter for immunization: Secondary | ICD-10-CM | POA: Insufficient documentation

## 2020-01-04 LAB — BASIC METABOLIC PANEL
BUN/Creatinine Ratio: 21 (ref 12–28)
BUN: 19 mg/dL (ref 8–27)
CO2: 26 mmol/L (ref 20–29)
Calcium: 10.1 mg/dL (ref 8.7–10.3)
Chloride: 95 mmol/L — ABNORMAL LOW (ref 96–106)
Creatinine, Ser: 0.89 mg/dL (ref 0.57–1.00)
GFR calc Af Amer: 70 mL/min/{1.73_m2} (ref >59)
GFR calc non Af Amer: 61 mL/min/{1.73_m2} (ref >59)
Glucose: 95 mg/dL (ref 65–99)
Potassium: 4.3 mmol/L (ref 3.5–5.2)
Sodium: 138 mmol/L (ref 134–144)

## 2020-01-04 LAB — CBC
Hematocrit: 47 % — ABNORMAL HIGH (ref 34.0–46.6)
Hemoglobin: 15.7 g/dL (ref 11.1–15.9)
MCH: 29.5 pg (ref 26.6–33.0)
MCHC: 33.4 g/dL (ref 31.5–35.7)
MCV: 88 fL (ref 79–97)
Platelets: 191 10*3/uL (ref 150–450)
RBC: 5.32 x10E6/uL — ABNORMAL HIGH (ref 3.77–5.28)
RDW: 13.1 % (ref 11.7–15.4)
WBC: 7.8 10*3/uL (ref 3.4–10.8)

## 2020-01-04 NOTE — Progress Notes (Signed)
   Covid-19 Vaccination Clinic  Name:  Adriana Hendricks    MRN: [id number] DOB: June 30, 1935  01/04/2020  Ms. [name] was observed post Covid-19 immunization for 30 minutes based on pre-vaccination screening without incidence. She was provided with Vaccine Information Sheet and instruction to access the V-Safe system.   Ms. [name] was instructed to call 911 with any severe reactions post vaccine: Marland Kitchen Difficulty breathing  . Swelling of your face and throat  . A fast heartbeat  . A bad rash all over your body  . Dizziness and weakness    Immunizations Administered    Name Date Dose VIS Date Route   Pfizer COVID-19 Vaccine 01/04/2020  2:49 PM 0.3 mL 12/06/2019 Intramuscular   Manufacturer: [place]   Lot: [id number]   [place]: [id number]

## 2020-01-25 ENCOUNTER — Ambulatory Visit: Payer: Medicare Other | Attending: Internal Medicine

## 2020-01-25 ENCOUNTER — Ambulatory Visit: Payer: Medicare Other

## 2020-01-25 DIAGNOSIS — Z23 Encounter for immunization: Secondary | ICD-10-CM | POA: Insufficient documentation

## 2020-01-25 NOTE — Progress Notes (Signed)
   Covid-19 Vaccination Clinic  Name:  Adriana Hendricks    MRN: [id number] DOB: Jul 01, 1935  01/25/2020  [name] was observed post Covid-19 immunization for 15 minutes without incidence. She was provided with Vaccine Information Sheet and instruction to access the V-Safe system.   Ms. Talk was instructed to call 911 with any severe reactions post vaccine: Marland Kitchen Difficulty breathing  . Swelling of your face and throat  . A fast heartbeat  . A bad rash all over your body  . Dizziness and weakness    Immunizations Administered    Name Date Dose VIS Date Route   Pfizer COVID-19 Vaccine 01/25/2020 11:17 AM 0.3 mL 12/06/2019 Intramuscular   Manufacturer: [place]   Lot: [id number]   [place]: [id number]

## 2020-02-26 ENCOUNTER — Other Ambulatory Visit: Payer: Self-pay

## 2020-02-26 ENCOUNTER — Ambulatory Visit (INDEPENDENT_AMBULATORY_CARE_PROVIDER_SITE_OTHER): Payer: Medicare Other | Admitting: Pharmacist

## 2020-02-26 DIAGNOSIS — Z7901 Long term (current) use of anticoagulants: Secondary | ICD-10-CM

## 2020-02-26 DIAGNOSIS — I4819 Other persistent atrial fibrillation: Secondary | ICD-10-CM | POA: Diagnosis not present

## 2020-02-26 LAB — POCT INR: INR: 1.9 — AB (ref 2.0–3.0)

## 2020-03-26 ENCOUNTER — Telehealth: Payer: Self-pay | Admitting: Family Medicine

## 2020-03-26 NOTE — Telephone Encounter (Signed)
Called patient she has had pain x 10 days after lifting plant that had to much water in it. She denies any sob chest pain, leg swelling or changes in vision. Offered open appointment with another provider but she declined wanted to see Dr. Donata Clay. I have put her in first same day open next week. Reviewed red words and had repeat back to me. If any she will go go ED

## 2020-03-26 NOTE — Telephone Encounter (Signed)
Pt called stating she is experiencing pain underneath her rib on right side that wraps around to her back. Pt is requesting to see Dr. Yong Channel. There is no availability except for same day slots next week. Can pt be scheduled in one of those slots? Please advise.

## 2020-03-27 ENCOUNTER — Other Ambulatory Visit: Payer: Self-pay

## 2020-03-27 ENCOUNTER — Ambulatory Visit (INDEPENDENT_AMBULATORY_CARE_PROVIDER_SITE_OTHER): Payer: Medicare Other | Admitting: Pharmacist Clinician (PhC)/ Clinical Pharmacy Specialist

## 2020-03-27 DIAGNOSIS — I4819 Other persistent atrial fibrillation: Secondary | ICD-10-CM

## 2020-03-27 DIAGNOSIS — Z7901 Long term (current) use of anticoagulants: Secondary | ICD-10-CM

## 2020-03-27 LAB — POCT INR: INR: 1.5 — AB (ref 2.0–3.0)

## 2020-04-02 ENCOUNTER — Ambulatory Visit (INDEPENDENT_AMBULATORY_CARE_PROVIDER_SITE_OTHER): Payer: Medicare Other | Admitting: Family Medicine

## 2020-04-02 ENCOUNTER — Other Ambulatory Visit: Payer: Self-pay

## 2020-04-02 ENCOUNTER — Encounter: Payer: Self-pay | Admitting: Family Medicine

## 2020-04-02 ENCOUNTER — Ambulatory Visit (INDEPENDENT_AMBULATORY_CARE_PROVIDER_SITE_OTHER): Payer: Medicare Other

## 2020-04-02 VITALS — BP 150/78 | HR 72 | Temp 98.4°F | Ht 65.0 in | Wt 169.0 lb

## 2020-04-02 DIAGNOSIS — D649 Anemia, unspecified: Secondary | ICD-10-CM

## 2020-04-02 DIAGNOSIS — I10 Essential (primary) hypertension: Secondary | ICD-10-CM | POA: Diagnosis not present

## 2020-04-02 DIAGNOSIS — I517 Cardiomegaly: Secondary | ICD-10-CM | POA: Diagnosis not present

## 2020-04-02 DIAGNOSIS — I4819 Other persistent atrial fibrillation: Secondary | ICD-10-CM | POA: Diagnosis not present

## 2020-04-02 DIAGNOSIS — G250 Essential tremor: Secondary | ICD-10-CM

## 2020-04-02 DIAGNOSIS — E785 Hyperlipidemia, unspecified: Secondary | ICD-10-CM

## 2020-04-02 DIAGNOSIS — R1013 Epigastric pain: Secondary | ICD-10-CM | POA: Diagnosis not present

## 2020-04-02 DIAGNOSIS — R0789 Other chest pain: Secondary | ICD-10-CM

## 2020-04-02 NOTE — Progress Notes (Signed)
Phone [phone] In person visit   Subjective:   Adriana Hendricks is a 85 y.o. year old very pleasant female patient who presents for/with See problem oriented charting Chief Complaint  Patient presents with  . Follow-up  . rib pain    This visit occurred during the SARS-CoV-2 public health emergency.  Safety protocols were in place, including screening questions prior to the visit, additional usage of staff PPE, and extensive cleaning of exam room while observing appropriate contact time as indicated for disinfecting solutions.   Past Medical History-  Patient Active Problem List   Diagnosis Date Noted  . Sinus node dysfunction (HCC)     Priority: High  . S/P minimally invasive tricuspid valve repair 06/12/2019    Priority: High  . S/P Maze operation for atrial fibrillation 06/12/2019    Priority: High  . Severe tricuspid regurgitation     Priority: High  . Persistent atrial fibrillation (HCC)     Priority: High  . Osteopenia 03/13/2017    Priority: High  . Bicuspid aortic valve 01/28/2019    Priority: Medium  . DOE (dyspnea on exertion) 07/26/2018    Priority: Medium  . Ocular migraine 03/13/2017    Priority: Medium  . OSA (obstructive sleep apnea) 12/21/2016    Priority: Medium  . Lower extremity neuropathy 08/23/2013    Priority: Medium  . Hyperlipemia 01/07/2010    Priority: Medium  . GERD with stricture 06/19/2009    Priority: Medium  . Essential tremor 11/16/2007    Priority: Medium  . Essential hypertension 11/16/2007    Priority: Medium  . 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 Tricuspid valve insufficiency     Priority: Low  . Long term (current) use of anticoagulants 08/28/2018    Priority: Low  . IBS (irritable bowel syndrome) 09/12/2016    Priority: Low  . History of adenomatous polyp of colon 09/12/2016    Priority: Low  . Former smoker 09/12/2016    Priority: Low  . Carpal tunnel syndrome 09/04/2013    Priority: Low   . Achilles tendon tear 01/17/2011    Priority: Low  . ACNE ROSACEA 04/03/2008    Priority: Low  . Palpitations 11/10/2019  . Pleural effusion, left     Medications- reviewed and updated Current Outpatient Medications  Medication Sig Dispense Refill  . aspirin EC 81 MG EC tablet Take 1 tablet (81 mg total) by mouth daily.    . B Complex Vitamins (B COMPLEX 100 PO) Take 1 tablet by mouth daily.     . calcium carbonate (TUMS - DOSED IN MG ELEMENTAL CALCIUM) 500 MG chewable tablet Chew 1 tablet by mouth daily.     . cholecalciferol (VITAMIN D3) 25 MCG (1000 UT) tablet Take 1,000 Units by mouth daily.    . famotidine (PEPCID) 20 MG tablet Take 20 mg by mouth at bedtime.    . ferrous sulfate 325 (65 FE) MG EC tablet Take 325 mg by mouth daily with breakfast.    . furosemide (LASIX) 40 MG tablet Take 0.5 tablets (20 mg total) by mouth daily. 45 tablet 2  . metoprolol tartrate (LOPRESSOR) 25 MG tablet Take 25 mg by mouth as needed.    . Multiple Vitamins-Minerals (PRESERVISION AREDS 2 PO) Take 2 capsules by mouth daily.     . nadolol (CORGARD) 40 MG tablet Take 1 tablet (40 mg total) by mouth daily. 90 tablet 3  . potassium chloride 20 MEQ/15ML (10%) SOLN Take 15 mLs (  20 mEq total) by mouth daily. TAKE 15 ML BY MOUTH  ONCE DAILY FOR  26  DOSES 390 mL 12  . warfarin (COUMADIN) 2.5 MG tablet Take 1 to 1.5 tablets by mouth daily or as instructed by Coumadin Clinic. 120 tablet 1   No current facility-administered medications for this visit.     Objective:  BP (!) 150/78   Pulse 72   Temp 98.4 F (36.9 C)   Ht 5\' 5"  (1.651 m)   Wt 169 lb (76.7 kg)   LMP  (LMP Unknown)   SpO2 94%   BMI 28.12 kg/m  Gen: NAD, resting comfortably CV: RRR no murmurs rubs or gallops Lungs: CTAB no crackles, wheeze, rhonchi Abdomen: soft/nontender other than with pain in RUQ and epigastric area- patient also has tendeness over loewr ribs/nondistended/normal bowel sounds. No rebound or guarding.  Ext: no  edema Skin: warm, dry    Assessment and Plan  Right rib Pain/slight shortness of reath S: pt c/o of pain under her ribs on the right side that started 3.5weeks ago and no injury known. Seems to come and go. Not worse with meals- had big steak for lunch and no issues. Not worse with fatty foods. Woke up with it this Am and calmed down.  At its worst is probably 6/10.  Was more frequent at first could be up to 10x a day sharp pains- now probably 2-3x a day at least. Episodes last half hour to an hour. Has tried apple cider vinegar diluted in water- that seemed to help but stopped over last 3 days.   No fall or injury . No exertional element to pain . No rash on skin to suggest shingles but has hurt around to her back slightly at times. Slightly winded- slightly worse over last 3 weeks.  A/P: 85 year old female with right-sided rib pain and right upper quadrant pain over the last 3 and half weeks.  Will check CBC, CMP, lipase-particularly interested in liver and pancreas.  We will also get right approximate ultrasound to evaluate for gallstones.  Also will get chest x-ray given pain on the ribs-patient reports left-sided pleural effusions that required thoracentesis over the last several months so I think it is prudent to make sure no effusion on the right though I doubt it.  Also rule out bony abnormality with chest x-ray  #Postoperative iron deficiency anemia S: Interestingly hemoglobin seem to drop Maze procedure and minimally invasive tricuspid valve repair for unclear reasons.  She has been placed on iron-on most recent CBC this had resolved A/P: Patient reports she is on iron-we will check iron stores today.  She does not really have a reason for ongoing iron deficiency and then may take her off iron-if anemia recurs consider Hemoccult testing/FIT testing  #hypertension S: compliant with  lasix 20mg , nadolol 40mg   BP Readings from Last 3 Encounters:  04/02/20 (!) 150/78  11/08/19 (!) 152/87   10/04/19 130/74  A/P: Poor control of blood pressure today.  We will continue current medications for now she agrees to do some home monitoring for me-would like for her to update me within 2 weeks with home numbers  Persistent atrial fibrillation [facility]) Per most recent cardiology notes appears to be remaining out of atrial fibrillation/flutter.  She has a history of Maze procedure June 2020 and left atrial appendage clipping.  She is on nadolol for rate control.  She remains on Coumadin-per cardiology notes also for concern of mitral valve disease.  She appears stable  today-continue current medications and cardiology follow-up  Essential tremor Nadolol has been helpful for patient for essential tremor.  Stable-continue current medication   Recommended follow up: As needed for acute concerns-she had been lost to follow-up mainly with COVID-19 pandemic-we will go ahead and update her lipid panel as well Future Appointments  Date Time Provider [place]  04/15/2020  2:15 PM [facility]  05/14/2020  3:20 PM Leonie Man, MD [facility]  07/13/2020  2:00 PM Parrett, Fonnie Mu, NP [facility] None    Lab/Order associations:   ICD-10-CM   1. Right-sided chest wall pain  R07.89 DG Ribs Unilateral W/Chest Right  2. Epigastric abdominal pain  R10.13 Lipase    US Abdomen Limited RUQ  3. Persistent atrial fibrillation (HCC)  I48.19   4. Hyperlipidemia, unspecified hyperlipidemia type  E78.5 Comprehensive metabolic panel    CBC with Differential/Platelet    Lipid panel  5. Essential hypertension  I10   6. Anemia, unspecified type  D64.9 IBC + Ferritin  7. Essential tremor  G25.0     Time Spent: 35 minutes of total time (3:01 PM- 3:26 PM, 11:03 PM-11:13 PM) was spent on the date of the encounter performing the following actions: chart review prior to seeing the patient, obtaining history, performing a medically necessary exam, counseling on the  treatment plan, placing orders, and documenting in our EHR.   Return precautions advised.  Garret Reddish, MD

## 2020-04-02 NOTE — Patient Instructions (Addendum)
Please stop by lab and x-ray before you go If you do not have mychart- we will call you about results within 5 business days of [place] receiving them.  If you have mychart- we will send your results within 3 business days of [place] receiving them.  If abnormal or we want to clarify a result, we will call or mychart you to make sure you receive the message.  If you have questions or concerns or don't hear within 5 business days, please send [place] a message or call us.    We will call you within two weeks about your referral for ultrasound of upper abdomen on right side. If you do not hear within 3 weeks, give [place] a call.   If you have new or worsening symptoms or workup is reassuring but you have ongoing symptoms please let me know

## 2020-04-02 NOTE — Assessment & Plan Note (Signed)
Nadolol has been helpful for patient for essential tremor.  Stable-continue current medication

## 2020-04-02 NOTE — Assessment & Plan Note (Signed)
Per most recent cardiology notes appears to be remaining out of atrial fibrillation/flutter.  She has a history of Maze procedure June 2020 and left atrial appendage clipping.  She is on nadolol for rate control.  She remains on Coumadin-per cardiology notes also for concern of mitral valve disease.  She appears stable today-continue current medications and cardiology follow-up

## 2020-04-03 LAB — COMPREHENSIVE METABOLIC PANEL
ALT: 19 U/L (ref 0–35)
AST: 20 U/L (ref 0–37)
Albumin: 4.4 g/dL (ref 3.5–5.2)
Alkaline Phosphatase: 90 U/L (ref 39–117)
BUN: 24 mg/dL — ABNORMAL HIGH (ref 6–23)
CO2: 31 mEq/L (ref 19–32)
Calcium: 9.6 mg/dL (ref 8.4–10.5)
Chloride: 99 mEq/L (ref 96–112)
Creatinine, Ser: 1 mg/dL (ref 0.40–1.20)
GFR: 52.99 mL/min — ABNORMAL LOW (ref >60.00)
Glucose, Bld: 106 mg/dL — ABNORMAL HIGH (ref 70–99)
Potassium: 4.1 mEq/L (ref 3.5–5.1)
Sodium: 139 mEq/L (ref 135–145)
Total Bilirubin: 1.6 mg/dL — ABNORMAL HIGH (ref 0.2–1.2)
Total Protein: 6.9 g/dL (ref 6.0–8.3)

## 2020-04-03 LAB — LIPID PANEL
Cholesterol: 198 mg/dL (ref 0–200)
HDL: 64.2 mg/dL (ref >39.00)
LDL Cholesterol: 105 mg/dL — ABNORMAL HIGH (ref 0–99)
NonHDL: 133.59
Total CHOL/HDL Ratio: 3
Triglycerides: 145 mg/dL (ref 0.0–149.0)
VLDL: 29 mg/dL (ref 0.0–40.0)

## 2020-04-03 LAB — CBC WITH DIFFERENTIAL/PLATELET
Basophils Absolute: 0.1 10*3/uL (ref 0.0–0.1)
Basophils Relative: 0.9 % (ref 0.0–3.0)
Eosinophils Absolute: 0.1 10*3/uL (ref 0.0–0.7)
Eosinophils Relative: 1.3 % (ref 0.0–5.0)
HCT: 39.7 % (ref 36.0–46.0)
Hemoglobin: 13.7 g/dL (ref 12.0–15.0)
Lymphocytes Relative: 13.4 % (ref 12.0–46.0)
Lymphs Abs: 1.1 10*3/uL (ref 0.7–4.0)
MCHC: 34.4 g/dL (ref 30.0–36.0)
MCV: 97.3 fl (ref 78.0–100.0)
Monocytes Absolute: 0.9 10*3/uL (ref 0.1–1.0)
Monocytes Relative: 10.2 % (ref 3.0–12.0)
Neutro Abs: 6.4 10*3/uL (ref 1.4–7.7)
Neutrophils Relative %: 74.2 % (ref 43.0–77.0)
Platelets: 166 10*3/uL (ref 150.0–400.0)
RBC: 4.08 Mil/uL (ref 3.87–5.11)
RDW: 13.6 % (ref 11.5–15.5)
WBC: 8.6 10*3/uL (ref 4.0–10.5)

## 2020-04-03 LAB — IBC + FERRITIN
Ferritin: 220 ng/mL (ref 10.0–291.0)
Iron: 94 ug/dL (ref 42–145)
Saturation Ratios: 24.8 % (ref 20.0–50.0)
Transferrin: 271 mg/dL (ref 212.0–360.0)

## 2020-04-03 LAB — LIPASE: Lipase: 56 U/L (ref 11.0–59.0)

## 2020-04-06 ENCOUNTER — Ambulatory Visit
Admission: RE | Admit: 2020-04-06 | Discharge: 2020-04-06 | Disposition: A | Discharge status: Home / Self Care | Payer: Medicare Other | Source: Ambulatory Visit | Attending: Family Medicine | Admitting: Family Medicine

## 2020-04-06 DIAGNOSIS — R1013 Epigastric pain: Secondary | ICD-10-CM

## 2020-04-15 ENCOUNTER — Encounter: Payer: Self-pay | Admitting: Family Medicine

## 2020-04-15 ENCOUNTER — Other Ambulatory Visit: Payer: Self-pay

## 2020-04-15 ENCOUNTER — Ambulatory Visit (INDEPENDENT_AMBULATORY_CARE_PROVIDER_SITE_OTHER): Payer: Medicare Other | Admitting: Pharmacist

## 2020-04-15 DIAGNOSIS — I4819 Other persistent atrial fibrillation: Secondary | ICD-10-CM | POA: Diagnosis not present

## 2020-04-15 DIAGNOSIS — Z7901 Long term (current) use of anticoagulants: Secondary | ICD-10-CM

## 2020-04-15 LAB — POCT INR: INR: 1.6 — AB (ref 2.0–3.0)

## 2020-04-15 MED ORDER — TIZANIDINE HCL 2 MG PO CAPS: 2.0000 mg | ORAL_CAPSULE | Freq: Two times a day (BID) | ORAL | 0 refills | Status: DC | PRN

## 2020-04-29 ENCOUNTER — Other Ambulatory Visit: Payer: Self-pay | Admitting: Cardiology

## 2020-04-29 ENCOUNTER — Other Ambulatory Visit: Payer: Self-pay

## 2020-04-29 ENCOUNTER — Ambulatory Visit (INDEPENDENT_AMBULATORY_CARE_PROVIDER_SITE_OTHER): Payer: Medicare Other | Admitting: *Deleted

## 2020-04-29 DIAGNOSIS — Z7901 Long term (current) use of anticoagulants: Secondary | ICD-10-CM

## 2020-04-29 DIAGNOSIS — Z5181 Encounter for therapeutic drug level monitoring: Secondary | ICD-10-CM

## 2020-04-29 DIAGNOSIS — I4819 Other persistent atrial fibrillation: Secondary | ICD-10-CM | POA: Diagnosis not present

## 2020-04-29 LAB — POCT INR: INR: 2.4 (ref 2.0–3.0)

## 2020-04-29 NOTE — Patient Instructions (Signed)
Description   Continue to take 1 tablet daily except 2 tablets each Wednesday and Friday.  Repeat INR in 3 weeks, Sees Dr. [name] on 5/20

## 2020-05-05 DIAGNOSIS — M8589 Other specified disorders of bone density and structure, multiple sites: Secondary | ICD-10-CM | POA: Diagnosis not present

## 2020-05-05 LAB — HM DEXA SCAN

## 2020-05-07 ENCOUNTER — Encounter: Payer: Self-pay | Admitting: Family Medicine

## 2020-05-08 ENCOUNTER — Other Ambulatory Visit: Payer: Self-pay

## 2020-05-08 DIAGNOSIS — R0789 Other chest pain: Secondary | ICD-10-CM

## 2020-05-11 ENCOUNTER — Encounter: Payer: Self-pay | Admitting: Family Medicine

## 2020-05-13 ENCOUNTER — Other Ambulatory Visit: Payer: Self-pay

## 2020-05-13 ENCOUNTER — Ambulatory Visit: Payer: Medicare Other | Attending: Family Medicine

## 2020-05-13 DIAGNOSIS — R252 Cramp and spasm: Secondary | ICD-10-CM

## 2020-05-13 DIAGNOSIS — M546 Pain in thoracic spine: Secondary | ICD-10-CM | POA: Diagnosis not present

## 2020-05-13 DIAGNOSIS — R293 Abnormal posture: Secondary | ICD-10-CM

## 2020-05-13 NOTE — Patient Instructions (Addendum)
Trigger Point Dry Needling  . What is Trigger Point Dry Needling (DN)? o DN is a physical therapy technique used to treat muscle pain and dysfunction. Specifically, DN helps deactivate muscle trigger points (muscle knots).  o A thin filiform needle is used to penetrate the skin and stimulate the underlying trigger point. The goal is for a local twitch response (LTR) to occur and for the trigger point to relax. No medication of any kind is injected during the procedure.   . What Does Trigger Point Dry Needling Feel Like?  o The procedure feels different for each individual patient. Some patients report that they do not actually feel the needle enter the skin and overall the process is not painful. Very mild bleeding may occur. However, many patients feel a deep cramping in the muscle in which the needle was inserted. This is the local twitch response.   Marland Kitchen How Will I feel after the treatment? o Soreness is normal, and the onset of soreness may not occur for a few hours. Typically this soreness does not last longer than two days.  o Bruising is uncommon, however; ice can be used to decrease any possible bruising.  o In rare cases feeling tired or nauseous after the treatment is normal. In addition, your symptoms may get worse before they get better, this period will typically not last longer than 24 hours.   . What Can I do After My Treatment? o Increase your hydration by drinking more water for the next 24 hours. o You may place ice or heat on the areas treated that have become sore, however, do not use heat on inflamed or bruised areas. Heat often brings more relief post needling. o You can continue your regular activities, but vigorous activity is not recommended initially after the treatment for 24 hours. o DN is best combined with other physical therapy such as strengthening, stretching, and other therapies.   Cervico-Thoracic: Extension / Rotation (Sitting)    Reach across body with left  arm and grasp back of chair. Gently look over right side shoulder. Hold __20__ seconds. Relax. Repeat _3___ times per set. Do _1___ sets per session. Do _3___ sessions per day.  [email or web address]  Access Code: [facility] URL: [email or web address] Date: 05/13/2020 Prepared by: Claiborne Billings  Exercises Standing Shoulder Posterior Capsule Stretch - 3 x daily - 7 x weekly - 1 sets - 3 reps - 20 hold Sidelying Thoracic Rotation with Open Book - 2 x daily - 7 x weekly - 1-2 sets - 10 reps  Copyright  VHI. All rights reserved.  [facility] [address], [place], [place] [address] Phone # [phone] Fax [phone]

## 2020-05-13 NOTE — Therapy (Signed)
[facility] [address] [place], [place], [address] Phone: [phone]   Fax:  [phone]  Physical Therapy Evaluation  Patient Details  Name: Adriana Hendricks MRN: [id number] Date of Birth: Aug 01, 1935 Referring Provider (PT): Garret Reddish, MD   Encounter Date: 05/13/2020  PT End of Session - 05/13/20 1143    Visit Number  1    Date for PT Re-Evaluation  07/08/20    Authorization Type  Medicare    Progress Note Due on Visit  10    PT Start Time  1101    PT Stop Time  1140    PT Time Calculation (min)  39 min    Activity Tolerance  Patient tolerated treatment well    Behavior During Therapy  [facility] for tasks assessed/performed       Past Medical History:  Diagnosis Date  . Achilles tendon rupture   . Arthritis of hand, right   . Atrial flutter with rapid ventricular response ([place])   . Carpal tunnel syndrome 09/04/2013  . Collagenous colitis 2005  . Colon polyps 2010   Tubular adenoma and hyperplastic  . Dental infection 10/25/2014  . Diverticulosis   . Esophageal stenosis   . Essential tremor   . GERD (gastroesophageal reflux disease)    hx hiatal hernia, hx esophagitis, hx stricture  . Hiatal hernia   . Hypertension   . Lower extremity neuropathy 08/23/2013   On B12 therapy with numbness in the feet bilaterally no evidence of diabetes   . Ocular migraine    jagged vision, a few per month  . OSA (obstructive sleep apnea) 12/21/2016   on CPAP  . Peripheral neuropathy    treated by Dr Posey Pronto (07/2015)  . Persistent atrial fibrillation (HCC)    Rate control with beta-blocker and anticoagulated with warfarin  . Pleural effusion, left   . S/P Maze operation for atrial fibrillation 06/12/2019   Complete bilateral atrial lesion set using cryothermy and bipolar radiofrequency ablation with clipping of LA appendage via right mini thoracotomy approach  . S/P minimally invasive tricuspid valve repair 06/12/2019    Complex valvuloplasty including autologous pericardial patch augmentation of anterior and posterior leaflets with 28 mm Edwards mc3 ring annuloplasty via right mini thoracotomy approach  . Severe tricuspid regurgitation 07/2018   Noted Aug 2019 during a fib workup. Severe LAE as well  . Sinus node dysfunction [facility])     Past Surgical History:  Procedure Laterality Date  . 48 hr Holter Monitor  07/2018   Persistent Afib (rate 33 - 124 bpm)  . APPENDECTOMY    . CARDIAC CATHETERIZATION    . CARDIOVERSION N/A 10/02/2018   Procedure: CARDIOVERSION;  Surgeon: Acie Fredrickson, Wonda Cheng, MD;  Location: [facility] ENDOSCOPY;  Service: Cardiovascular;  Laterality: N/A;  . CARPAL TUNNEL RELEASE    . CATARACT EXTRACTION    . EXCISION MORTON'S NEUROMA     Right foot  . EYE SURGERY     Cataracts with implants-bilateral  . INTRAOCULAR LENS IMPLANT, SECONDARY    . IR THORACENTESIS ASP PLEURAL SPACE W/IMG GUIDE  07/10/2019  . IR THORACENTESIS ASP PLEURAL SPACE W/IMG GUIDE  07/10/2019  . IR THORACENTESIS ASP PLEURAL SPACE W/IMG GUIDE  07/26/2019  . MINIMALLY INVASIVE MAZE PROCEDURE N/A 06/12/2019   Procedure: MINIMALLY INVASIVE MAZE PROCEDURE;  Surgeon: Rexene Alberts, MD;  Location: [place];  Service: Open Heart Surgery;  Laterality: N/A;  . MINIMALLY INVASIVE TRICUSPID VALVE REPAIR Right 06/12/2019   Procedure: MINIMALLY  INVASIVE TRICUSPID VALVE REPAIR USING EDWARDS MC3 T28 ANNULOPLASTY RING, INSERTION TEMPORARY TRANSVENOUS AV PACING LEAD;  Surgeon: Rexene Alberts, MD;  Location: [place] [facility];  Service: Open Heart Surgery;  Laterality: Right;  . Moles removed    . MOUTH SURGERY     (For Exostosis)  . NUCLEAR  07/2018   EF 71 %. LOW RISK - no ischemia or Infarct.   Marland Kitchen RIGHT/LEFT HEART CATH AND CORONARY ANGIOGRAPHY N/A 01/16/2019   Procedure: RIGHT/LEFT HEART CATH AND CORONARY ANGIOGRAPHY;  Surgeon: Leonie Man, MD;  Location: [place] CV LAB;; Angiographically normal coronary arteries.  Normal LVEDP. Upper Limit of  Normal PAP~23 mmHg & PCWP 18 mmHg.  LVEDP of 7 mmHg. -With mean PAP of 23 mmHg there is a TPG suggesting a primary pulmonary etiology.  Marland Kitchen ROTATOR CUFF REPAIR    . TEE WITHOUT CARDIOVERSION N/A 01/16/2019   Procedure: TRANSESOPHAGEAL ECHOCARDIOGRAM (TEE);  Surgeon: Sueanne Margarita, MD;  Location: [facility] ENDOSCOPY;;  Severe TR due to poor coaptation of the leaflets from annular dilation.  Mild to moderate mitral vegetation with mildly restricted mobility of the posterior leaflet.  EF 55-60% with no R WMA.  Severe RA and LA dilation.  . TEE WITHOUT CARDIOVERSION N/A 06/12/2019   Procedure: TRANSESOPHAGEAL ECHOCARDIOGRAM (TEE);  Surgeon: Rexene Alberts, MD;  Location: [place];  Service: Open Heart Surgery;  Laterality: N/A;  . TONSILLECTOMY    . TRANSTHORACIC ECHOCARDIOGRAM  08/05/2019   First postop TAVR: EF 60 to 65%. Mod concentric LVH. Grade III DD (? w/ Mild LA dilation).  Rheumatic MV w/ mod thickening & Ca2+.  Anterior leaflet has mild doming but no MVP.  Mild-mod MR and mild to mod MS (mean MVA gradient 7 mmHg).  ~ functional bicuspid AoV w/ Mod Sclerosis - no AS.  Thoracic Aorta (4.1 & 3.7 @ Sinus of  V, prox Ascending) - mild dilation  . TRANSTHORACIC ECHOCARDIOGRAM  08/02/2018   Normal LV Fxn (EF 60-65%) no RWMA.  Severe LA dilation & Severe TR.    There were no vitals filed for this visit.   Subjective Assessment - 05/13/20 1105    Subjective  Pt presents to PT with complaints of Rt sided chest wall pain that wraps around to the Rt shoulder blade area.  No incident or injury.  All internal and systemic causes have been ruled out.    Pertinent History  cardiac surgery 06/11/20    Diagnostic tests  many tests to rule out internal causes- all negative    Currently in Pain?  Yes    Pain Score  2    2-6/10   Pain Location  Scapula    Pain Orientation  Right    Pain Descriptors / Indicators  Aching;Sharp    Pain Type  Chronic pain    Pain Radiating Towards  anterior Rt ribs    Pain Onset  More  than a month ago    Pain Frequency  Intermittent    Aggravating Factors   lifting, lying on back with sleep at night, reaching overhead    Pain Relieving Factors  pressing on the spot that is aggravated         [facility] PT Assessment - 05/13/20 0001      Assessment   Medical Diagnosis  Rt sided chest wall pain    Referring Provider (PT)  Garret Reddish, MD    Onset Date/Surgical Date  03/13/20    Hand Dominance  Right    Next MD  Visit  as needed      Precautions   Precautions  None      Restrictions   Weight Bearing Restrictions  No      Balance Screen   Has the patient fallen in the past 6 months  No    Has the patient had a decrease in activity level because of a fear of falling?   No    Is the patient reluctant to leave their home because of a fear of falling?   No      Home Film/video editor residence    Living Arrangements  Spouse/significant other      Prior Function   Level of [place]  Retired    Leisure  Landscape architect   Overall Cognitive Status  Within Functional Limits for tasks assessed      Observation/Other Assessments   Focus on Therapeutic Outcomes (FOTO)   42% limitation      Posture/Postural Control   Posture/Postural Control  Postural limitations    Postural Limitations  Rounded Shoulders;Forward head;Flexed trunk      ROM / Strength   AROM / PROM / Strength  AROM;Strength      AROM   Overall AROM   Deficits    Overall AROM Comments  Cervical A/ROM is limited by 25% into flexion and bil rotation, sidebending limited by 50% bil.  UE A/ROM is limited by 25% in all directions.       Strength   Overall Strength  Within functional limits for tasks performed    Overall Strength Comments  pain in Rt scapula with resisted testing of the Rt UE      Palpation   Spinal mobility  reduced spinal mobility with pain T6-9    Palpation comment  localized palpable spasm over Rt medial  scapular border and thoracic paraspinals.  Tenderness over Rt rib #7 and 8      Ambulation/Gait   Gait Pattern  Within Functional Limits                  Objective measurements completed on examination: See above findings.        Trigger Point Dry Needling - 05/13/20 0001    Consent Given?  Yes    Education Handout Provided  Yes    Muscles Treated Upper Quadrant  Middle trapezius;Rhomboids   Rt only   Other Dry Needling  thoracic multifidi T6-8 Rt only    Rhomboids Response  Twitch response elicited;Palpable increased muscle length    Middle trapezius Response  Twitch response elicited;Palpable increased muscle length           PT Education - 05/13/20 1137    Education Details  Access Code: [email or web address], DN info    Person(s) Educated  Patient    Methods  Explanation;Demonstration;Handout    Comprehension  Verbalized understanding;Returned demonstration       PT Short Term Goals - 05/13/20 1310      PT SHORT TERM GOAL #1   Title  be independent in initial HEP    Time  4    Period  Weeks    Status  New    Target Date  06/10/20      PT SHORT TERM GOAL #2   Title  report a 30% reduction in the frequency and intensity of Rt scapular pain with ADLs and self-care    Baseline  --  Time  4    Period  Weeks    Status  New    Target Date  06/10/20        PT Long Term Goals - 05/13/20 1314      PT LONG TERM GOAL #1   Title  be independent in advanced HEP    Time  8    Period  Weeks    Status  New    Target Date  07/08/20      PT LONG TERM GOAL #2   Title  report a 60% reduction in the frequency and intensity of scapular pain with ADLs and self-care    Baseline  --    Time  8    Period  Weeks    Status  New    Target Date  07/08/20      PT LONG TERM GOAL #3   Title  lift over head without limitation    Baseline  --    Time  8    Period  Weeks    Status  New    Target Date  07/08/20      PT LONG TERM GOAL #4   Title  sleep all night  without waking due to thoracic pain    Time  8    Period  Weeks    Status  New    Target Date  07/08/20      PT LONG TERM GOAL #5   Title  reduce FOTO to < or = to 33% limitation    Baseline  ---    Time  8    Period  Weeks    Status  New    Target Date  07/08/20             Plan - 05/13/20 1230    Clinical Impression Statement  Pt presents to PT with Rt medial scapular pain that radiates to the Rt anterior rib cage.  This pain began ~2 months ago without incident or injury.  Pt had had a battery of tests to rule out systemic or internal causes.  Pt reports 2-6/10 in the medial Rt scapula and this is worse at night (wakes 1x/night with this), use of arms and lifting overhead.  Pt demonstrates chronic A/ROM deficits in the cervical spine and bil UEs.  Pt with forward head and rounded shoulder posture.  Pt with reduced thoracic segmental mobility and localized palpable tenderness and trigger points over Rt rhomboids/middle traps and thoracic paraspinals.  Pt will benefit from skilled PT to reduce pain, muscle tension/spasms, flexibility and strength.    Personal Factors and Comorbidities  Comorbidity 1    Comorbidities  cardiac surgery    Examination-Activity Limitations  Carry;Lift    Stability/Clinical Decision Making  Stable/Uncomplicated    Clinical Decision Making  Low    Rehab Potential  Excellent    PT Frequency  1x / week    PT Duration  8 weeks    PT Treatment/Interventions  ADLs/Self Care Home Management;Cryotherapy;Moist Heat;Traction;Electrical Stimulation;Functional mobility training;Neuromuscular re-education;Therapeutic exercise;Therapeutic activities;Patient/family education;Manual techniques;Passive range of motion;Dry needling;Taping    PT Next Visit Plan  repeat dry needling to Rt thoracic if helpful, review flexibility, manual for trigger point release, rib mobs    PT Home Exercise Plan  Access Code: [email or web address] and Agree with Plan of Care  Patient        Patient will benefit from skilled therapeutic intervention in order to improve the following deficits and  impairments:  Decreased activity tolerance, Impaired flexibility, Impaired UE functional use, Improper body mechanics, Increased muscle spasms, Hypomobility  Visit Diagnosis: Abnormal posture - Plan: PT plan of care cert/re-cert  Pain in thoracic spine - Plan: PT plan of care cert/re-cert  Cramp and spasm - Plan: PT plan of care cert/re-cert     Problem List Patient Active Problem List   Diagnosis Date Noted  . Palpitations 11/10/2019  . Pleural effusion, left   . Sinus node dysfunction (HCC)   . S/P minimally invasive tricuspid valve repair 06/12/2019  . S/P Maze operation for atrial fibrillation 06/12/2019  . 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 Bicuspid aortic valve 01/28/2019  . Tricuspid valve insufficiency   . Severe tricuspid regurgitation   . Long term (current) use of anticoagulants 08/28/2018  . Persistent atrial fibrillation ([place])   . DOE (dyspnea on exertion) 07/26/2018  . Osteopenia 03/13/2017  . Ocular migraine 03/13/2017  . OSA (obstructive sleep apnea) 12/21/2016  . IBS (irritable bowel syndrome) 09/12/2016  . History of adenomatous polyp of colon 09/12/2016  . Former smoker 09/12/2016  . Carpal tunnel syndrome 09/04/2013  . Lower extremity neuropathy 08/23/2013  . Achilles tendon tear 01/17/2011  . Hyperlipemia 01/07/2010  . GERD with stricture 06/19/2009  . ACNE ROSACEA 04/03/2008  . Essential tremor 11/16/2007  . Essential hypertension 11/16/2007    Shahmeer Bunn 05/13/2020, 1:18 PM  [facility] [address], [place], [place], [address] Phone: [phone]   Fax:  [phone]  Name: Adriana Hendricks MRN: [id number] Date of Birth: 12-18-1935

## 2020-05-14 ENCOUNTER — Ambulatory Visit (INDEPENDENT_AMBULATORY_CARE_PROVIDER_SITE_OTHER): Payer: Medicare Other | Admitting: Cardiology

## 2020-05-14 ENCOUNTER — Ambulatory Visit (INDEPENDENT_AMBULATORY_CARE_PROVIDER_SITE_OTHER): Payer: Medicare Other | Admitting: Pharmacist Clinician (PhC)/ Clinical Pharmacy Specialist

## 2020-05-14 ENCOUNTER — Other Ambulatory Visit: Payer: Self-pay

## 2020-05-14 ENCOUNTER — Encounter: Payer: Self-pay | Admitting: Cardiology

## 2020-05-14 VITALS — BP 158/76 | HR 72 | Ht 65.0 in | Wt 168.0 lb

## 2020-05-14 DIAGNOSIS — Z9889 Other specified postprocedural states: Secondary | ICD-10-CM

## 2020-05-14 DIAGNOSIS — I071 Rheumatic tricuspid insufficiency: Secondary | ICD-10-CM

## 2020-05-14 DIAGNOSIS — Z8679 Personal history of other diseases of the circulatory system: Secondary | ICD-10-CM

## 2020-05-14 DIAGNOSIS — Q231 Congenital insufficiency of aortic valve: Secondary | ICD-10-CM | POA: Diagnosis not present

## 2020-05-14 DIAGNOSIS — R0600 Dyspnea, unspecified: Secondary | ICD-10-CM | POA: Diagnosis not present

## 2020-05-14 DIAGNOSIS — I10 Essential (primary) hypertension: Secondary | ICD-10-CM | POA: Diagnosis not present

## 2020-05-14 DIAGNOSIS — R0609 Other forms of dyspnea: Secondary | ICD-10-CM

## 2020-05-14 DIAGNOSIS — Z7901 Long term (current) use of anticoagulants: Secondary | ICD-10-CM | POA: Diagnosis not present

## 2020-05-14 DIAGNOSIS — I4819 Other persistent atrial fibrillation: Secondary | ICD-10-CM | POA: Diagnosis not present

## 2020-05-14 DIAGNOSIS — I495 Sick sinus syndrome: Secondary | ICD-10-CM

## 2020-05-14 DIAGNOSIS — I051 Rheumatic mitral insufficiency: Secondary | ICD-10-CM | POA: Diagnosis not present

## 2020-05-14 LAB — POCT INR: INR: 1.5 — AB (ref 2.0–3.0)

## 2020-05-14 NOTE — Progress Notes (Signed)
Primary Care Provider: Marin Olp, MD Cardiologist: Glenetta Hew, MD Electrophysiologist: None   Clinic Note: Chief Complaint  Patient presents with  . Follow-up    No major issues  . Atrial Fibrillation    No prolonged symptoms recently  . Cardiac Valve Problem    Status post tricuspid valve repair    HPI:    Adriana Hendricks is a 85 y.o. female with a PMH noted below who presents today for 89-month follow-up  HISTORY OF SEVERE RHEUMATIC TRICUSPID REGURGITATION-complicated by PERSISTENT A. FIB (s/p TVR-MAZE&LAA Clipping -June 2020--complicated by postprocedural pleural effusions requiring thoracentesis x2) -> with chronic dyspnea  Adriana Hendricks was last seen on November 13 , 2020 -->  in follow-up from her monitor.  This was to evaluate the effectiveness of having converted from metoprolol to nadolol.  She noted feeling well.  Progress back to baseline as far as energy level but still has her baseline exertional dyspnea although somewhat improved.  (Certainly better after her surgery, but not back to baseline from several years ago.  Tremor notably improved.  Blood pressure better controlled. --> Her monitor showed about 240 episodes of SVT lasting 7-10 beats ranging from 94 to 141 bpm.  Nothing longer than that.  No significant arrhythmias or pauses.  No significant bradycardia.  Recent Hospitalizations: None  Reviewed  CV studies:    The following studies were reviewed today: (if available, images/films reviewed: From Epic Chart or Care Everywhere) . None:  Interval History:   Adriana Hendricks returns today overall still feeling pretty much happy with how she is doing.  Her energy level is okay.  Breathing is stable.  She is not happy with the fact she has dyspnea, but does acknowledge an improvement at this stage from her surgery compared to last year.  She is less stressed out here today, but has had some higher than usual blood pressures off and on.  Has not had any  prolonged irregular heart rates or palpitations.  No syncope or near syncope.  Her blood pressure is being followed closely by her PCP.  CV Review of Symptoms (Summary) Cardiovascular ROS: positive for - dyspnea on exertion and Occasional skipped beats.  Dyspnea stable. negative for - edema, orthopnea, paroxysmal nocturnal dyspnea, rapid heart rate, shortness of breath or Syncope/near syncope, TIA/amaurosis fugax, claudication  The patient does not have symptoms concerning for COVID-19 infection (fever, chills, cough, or new shortness of breath).  The patient is practicing social distancing & Masking.   She has had her COVID-19 vaccine injections.   REVIEWED OF SYSTEMS   Review of Systems  Constitutional: Negative for malaise/fatigue and weight loss.  HENT: Negative for nosebleeds.   Respiratory: Positive for shortness of breath (Stable).   Cardiovascular: Negative for leg swelling.  Gastrointestinal: Negative for blood in stool and melena.  Genitourinary: Negative for hematuria.  Musculoskeletal: Negative for falls and joint pain.  Neurological: Negative for dizziness and focal weakness.  Psychiatric/Behavioral: Negative for memory loss. The patient does not have insomnia.    I have reviewed and (if needed) personally updated the patient's problem list, medications, allergies, past medical and surgical history, social and family history.   PAST MEDICAL HISTORY   Past Medical History:  Diagnosis Date  . Achilles tendon rupture   . Arthritis of hand, right   . Atrial flutter with rapid ventricular response ([place])   . Carpal tunnel syndrome 09/04/2013  . Collagenous colitis 2005  . Colon polyps 2010  Tubular adenoma and hyperplastic  . Dental infection 10/25/2014  . Diverticulosis   . Esophageal stenosis   . Essential tremor   . GERD (gastroesophageal reflux disease)    hx hiatal hernia, hx esophagitis, hx stricture  . Hiatal hernia   . Hypertension   . Lower extremity  neuropathy 08/23/2013   On B12 therapy with numbness in the feet bilaterally no evidence of diabetes   . Ocular migraine    jagged vision, a few per month  . OSA (obstructive sleep apnea) 12/21/2016   on CPAP  . Peripheral neuropathy    treated by Dr Posey Pronto (07/2015)  . Persistent atrial fibrillation (HCC)    Rate control with beta-blocker and anticoagulated with warfarin  . Pleural effusion, left   . S/P Maze operation for atrial fibrillation 06/12/2019   Complete bilateral atrial lesion set using cryothermy and bipolar radiofrequency ablation with clipping of LA appendage via right mini thoracotomy approach  . S/P minimally invasive tricuspid valve repair 06/12/2019   Complex valvuloplasty including autologous pericardial patch augmentation of anterior and posterior leaflets with 28 mm Edwards mc3 ring annuloplasty via right mini thoracotomy approach  . Severe tricuspid regurgitation 07/2018   Noted Aug 2019 during a fib workup. Severe LAE as well  . Sinus node dysfunction (HCC)     PAST SURGICAL HISTORY    Past Surgical History:  Procedure Laterality Date  . 48 hr Holter Monitor  07/2018   Persistent Afib (rate 33 - 124 bpm)  . APPENDECTOMY    . CARDIAC CATHETERIZATION    . CARDIOVERSION N/A 10/02/2018   Procedure: CARDIOVERSION;  Surgeon: Acie Fredrickson, Wonda Cheng, MD;  Location: [facility] ENDOSCOPY;  Service: Cardiovascular;  Laterality: N/A;  . CARPAL TUNNEL RELEASE    . CATARACT EXTRACTION    . EXCISION MORTON'S NEUROMA     Right foot  . EYE SURGERY     Cataracts with implants-bilateral  . INTRAOCULAR LENS IMPLANT, SECONDARY    . IR THORACENTESIS ASP PLEURAL SPACE W/IMG GUIDE  07/10/2019  . IR THORACENTESIS ASP PLEURAL SPACE W/IMG GUIDE  07/10/2019  . IR THORACENTESIS ASP PLEURAL SPACE W/IMG GUIDE  07/26/2019  . MINIMALLY INVASIVE MAZE PROCEDURE N/A 06/12/2019   Procedure: MINIMALLY INVASIVE MAZE PROCEDURE;  Surgeon: Rexene Alberts, MD;  Location: [facility];  Service: Open Heart Surgery;   Laterality: N/A;  . MINIMALLY INVASIVE TRICUSPID VALVE REPAIR Right 06/12/2019   Procedure: MINIMALLY INVASIVE TRICUSPID VALVE REPAIR USING EDWARDS MC3 T28 ANNULOPLASTY RING, INSERTION TEMPORARY TRANSVENOUS AV PACING LEAD;  Surgeon: Rexene Alberts, MD;  Location: [facility];  Service: Open Heart Surgery;  Laterality: Right;  . Moles removed    . MOUTH SURGERY     (For Exostosis)  . NUCLEAR  07/2018   EF 71 %. LOW RISK - no ischemia or Infarct.   Marland Kitchen RIGHT/LEFT HEART CATH AND CORONARY ANGIOGRAPHY N/A 01/16/2019   Procedure: RIGHT/LEFT HEART CATH AND CORONARY ANGIOGRAPHY;  Surgeon: Leonie Man, MD;  Location: [facility];; Angiographically normal coronary arteries.  Normal LVEDP. Upper Limit of Normal PAP~23 mmHg & PCWP 18 mmHg.  LVEDP of 7 mmHg. -With mean PAP of 23 mmHg there is a TPG suggesting a primary pulmonary etiology.  Marland Kitchen ROTATOR CUFF REPAIR    . TEE WITHOUT CARDIOVERSION N/A 01/16/2019   Procedure: TRANSESOPHAGEAL ECHOCARDIOGRAM (TEE);  Surgeon: Sueanne Margarita, MD;  Location: [facility] ENDOSCOPY;;  Severe TR due to poor coaptation of the leaflets from annular dilation.  Mild to moderate mitral vegetation  with mildly restricted mobility of the posterior leaflet.  EF 55-60% with no R WMA.  Severe RA and LA dilation.  . TEE WITHOUT CARDIOVERSION N/A 06/12/2019   Procedure: TRANSESOPHAGEAL ECHOCARDIOGRAM (TEE);  Surgeon: Rexene Alberts, MD;  Location: [facility];  Service: Open Heart Surgery;  Laterality: N/A;  . TONSILLECTOMY    . TRANSTHORACIC ECHOCARDIOGRAM  08/05/2019   First postop TAVR: EF 60 to 65%. Mod concentric LVH. Grade III DD (? w/ Mild LA dilation).  Rheumatic MV w/ mod thickening & Ca2+.  Anterior leaflet has mild doming but no MVP.  Mild-mod MR and mild to mod MS (mean MVA gradient 7 mmHg).  ~ functional bicuspid AoV w/ Mod Sclerosis - no AS.  Thoracic Aorta (4.1 & 3.7 @ Sinus of  V, prox Ascending) - mild dilation  . TRANSTHORACIC ECHOCARDIOGRAM  08/02/2018   Normal LV Fxn (EF  60-65%) no RWMA.  Severe LA dilation & Severe TR.    MEDICATIONS/ALLERGIES   Current Meds  Medication Sig  . aspirin EC 81 MG EC tablet Take 1 tablet (81 mg total) by mouth daily.  . B Complex Vitamins (B COMPLEX 100 PO) Take 1 tablet by mouth daily.   . calcium carbonate (TUMS - DOSED IN MG ELEMENTAL CALCIUM) 500 MG chewable tablet Chew 1 tablet by mouth daily.   . cholecalciferol (VITAMIN D3) 25 MCG (1000 UT) tablet Take 1,000 Units by mouth daily.  . famotidine (PEPCID) 20 MG tablet Take 20 mg by mouth at bedtime.  . ferrous sulfate 325 (65 FE) MG EC tablet Take 325 mg by mouth daily with breakfast.  . furosemide (LASIX) 40 MG tablet Take 0.5 tablets (20 mg total) by mouth daily.  . metoprolol tartrate (LOPRESSOR) 25 MG tablet Take 25 mg by mouth as needed.  . Multiple Vitamins-Minerals (PRESERVISION AREDS 2 PO) Take 2 capsules by mouth daily.   . nadolol (CORGARD) 40 MG tablet Take 1 tablet (40 mg total) by mouth daily.  . potassium chloride 20 MEQ/15ML (10%) SOLN Take 15 mLs (20 mEq total) by mouth daily. TAKE 15 ML BY MOUTH  ONCE DAILY FOR  26  DOSES  . tizanidine (ZANAFLEX) 2 MG capsule Take 1 capsule (2 mg total) by mouth 2 (two) times daily as needed for muscle spasms.  Marland Kitchen warfarin (COUMADIN) 2.5 MG tablet TAKE 1 TO 1 & 1/2 (ONE & ONE-HALF) TABLETS BY MOUTH ONCE DAILY OR  AS  INSTRUCTED  BY  COUMADIN  CLINIC    Allergies  Allergen Reactions  . Levofloxacin Other (See Comments)    Developed tendon pain. (Has a history of a Achilles tendon tear)  . Potassium Chloride Hives  . Clarithromycin Nausea Only  . Erythromycin Base Nausea And Vomiting  . Metronidazole Rash  . Penicillins Rash and Other (See Comments)    Did it involve swelling of the face/tongue/throat, SOB, or low BP? No Did it involve sudden or severe rash/hives, skin peeling, or any reaction on the inside of your mouth or nose? No Did you need to seek medical attention at a hospital or doctor's office? No When did  it last happen? as a child If all above answers are "NO", may proceed with cephalosporin use.     SOCIAL HISTORY/FAMILY HISTORY   Reviewed in Epic:  Pertinent findings: *N/A  OBJCTIVE -PE, EKG, labs   Wt Readings from Last 3 Encounters:  05/14/20 168 lb (76.2 kg)  04/02/20 169 lb (76.7 kg)  11/08/19 162 lb (73.5 kg)  Physical Exam: BP (!) 158/76   Pulse 72   Ht 5\' 5"  (1.651 m)   Wt 168 lb (76.2 kg)   LMP  (LMP Unknown)   BMI 27.96 kg/m  Physical Exam  Constitutional: She appears well-developed. No distress.  Well-nourished, well-groomed.  Healthy-appearing.  HENT:  Head: Normocephalic and atraumatic.  Neck: No hepatojugular reflux and no JVD present. Carotid bruit is not present.  Cardiovascular: Normal rate, regular rhythm, S1 normal, S2 normal and intact distal pulses.  Occasional extrasystoles are present. PMI is not displaced. Exam reveals no gallop and no friction rub.  Murmur heard.  Medium-pitched blowing holosystolic murmur is present with a grade of 1/6 at the lower left sternal border radiating to the apex. High-pitched harsh crescendo-decrescendo early systolic murmur of grade 1/6 is also present at the upper right sternal border. Musculoskeletal:     Cervical back: Normal range of motion and neck supple.  Psychiatric: She has a normal mood and affect. Her behavior is normal. Judgment and thought content normal.  Vitals reviewed.   Adult ECG Report  Rate: 72 ;  Rhythm: normal sinus rhythm and 1 degree AVB.  Otherwise RSR', repolarization changes;   Narrative Interpretation: Stable EKG  Recent Labs:    Lab Results  Component Value Date   CHOL 198 04/02/2020   HDL 64.20 04/02/2020   LDLCALC 105 (H) 04/02/2020   LDLDIRECT 128.8 01/17/2011   TRIG 145.0 04/02/2020   CHOLHDL 3 04/02/2020   Lab Results  Component Value Date   CREATININE 1.00 04/02/2020   BUN 24 (H) 04/02/2020   NA 139 04/02/2020   K 4.1 04/02/2020   CL 99 04/02/2020   CO2 31  04/02/2020   Lab Results  Component Value Date   TSH 3.440 06/26/2019    ASSESSMENT/PLAN    Problem List Items Addressed This Visit    Rheumatic mitral regurgitation (Chronic)    Still has some mitral disease as well.  Not really heard on exam.  Plan to recheck echo in 2022.      Essential hypertension (Chronic)    Blood pressure is up a little bit on low-dose Lasix and nadolol.  I am fine with mild permissive hypertension.  PCP is following blood pressure closely.  Would be reluctant to add additional medication at this point since she is doing relatively well.      Relevant Orders   EKG 12-Lead (Completed)   DOE (dyspnea on exertion) (Chronic)    I think she is gradually getting energy level back and gradually getting back into more activity.  This seems to be a stable slowly improving symptom.      Persistent atrial fibrillation (HCC) - Primary (Chronic)    Status post Maze during valve surgery in June 2020 along with left atrial appendage.  Does not sound like he has had any breakthrough spells.  Remains on warfarin for anticoagulation (with rheumatic valvular disease, DOAC not indicated)  Is on nadolol for rate control.  Not requiring any rhythm control.      Relevant Orders   EKG 12-Lead (Completed)   Long term (current) use of anticoagulants (Chronic)   Tricuspid valve insufficiency (Chronic)    S/p TVR--looks stable on echocardiogram.  Would probably follow-up next year      Bicuspid aortic valve (Chronic)    Likely functional related to senile calcification.  No significant stenosis noted.  Will be due to follow-up of mitral valve in 2022, can reassess.      S/P minimally  invasive tricuspid valve repair (Chronic)   S/P Maze operation for atrial fibrillation (Chronic)   Sinus node dysfunction (HCC)    No signs of bradycardia.  Holding off on aggressive rate control.  Is stable on current dose of nadolol.         COVID-19 Education: The signs and  symptoms of COVID-19 were discussed with the patient and how to seek care for testing (follow up with PCP or arrange E-visit).   The importance of social distancing and COVID-19 vaccination was discussed today.  I spent a total of 86minutes with the patient. >  50% of the time was spent in direct patient consultation.  Additional time spent with chart review  / charting (studies, outside notes, etc): 6 Total Time: 24 min   Current medicines are reviewed at length with the patient today.  (+/- concerns) none  Notice: This dictation was prepared with Dragon dictation along with smaller phrase technology. Any transcriptional errors that result from this process are unintentional and may not be corrected upon review.  Patient Instructions / Medication Changes & Studies & Tests Ordered   Patient Instructions  Medication Instructions:  No changes  *If you need a refill on your cardiac medications before your next appointment, please call your pharmacy*   Lab Work: Not needed    Testing/Procedures: Not needed   Follow-Up: At [facility], you and your health needs are our priority.  As part of our continuing mission to provide you with exceptional heart care, we have created designated Provider Care Teams.  These Care Teams include your primary Cardiologist (physician) and Advanced Practice Providers (APPs -  Physician Assistants and Nurse Practitioners) who all work together to provide you with the care you need, when you need it.      Your next appointment:   12 month(s)  The format for your next appointment:   In Person  Provider:   Glenetta Hew, MD       Studies Ordered:   Orders Placed This Encounter  Procedures  . EKG 12-Lead     Glenetta Hew, M.D., M.S. Interventional Cardiologist   Pager # [phone] Phone # [phone] [address]. [place], [place] [address]   Thank you for choosing [facility] at [facility]!!

## 2020-05-14 NOTE — Patient Instructions (Signed)

## 2020-05-18 ENCOUNTER — Encounter: Payer: Self-pay | Admitting: Cardiology

## 2020-05-18 NOTE — Assessment & Plan Note (Signed)
Still has some mitral disease as well.  Not really heard on exam.  Plan to recheck echo in 2022.

## 2020-05-18 NOTE — Assessment & Plan Note (Signed)
Blood pressure is up a little bit on low-dose Lasix and nadolol.  I am fine with mild permissive hypertension.  PCP is following blood pressure closely.  Would be reluctant to add additional medication at this point since she is doing relatively well.

## 2020-05-18 NOTE — Assessment & Plan Note (Signed)
No signs of bradycardia.  Holding off on aggressive rate control.  Is stable on current dose of nadolol.

## 2020-05-18 NOTE — Assessment & Plan Note (Signed)
S/p TVR--looks stable on echocardiogram.  Would probably follow-up next year

## 2020-05-18 NOTE — Assessment & Plan Note (Signed)
Status post Maze during valve surgery in June 2020 along with left atrial appendage.  Does not sound like he has had any breakthrough spells.  Remains on warfarin for anticoagulation (with rheumatic valvular disease, DOAC not indicated)  Is on nadolol for rate control.  Not requiring any rhythm control.

## 2020-05-18 NOTE — Assessment & Plan Note (Signed)
Most recent LDL in April 2021 was 105 on no medications.  At this point, I see no reason to be more aggressive

## 2020-05-18 NOTE — Assessment & Plan Note (Signed)
I think she is gradually getting energy level back and gradually getting back into more activity.  This seems to be a stable slowly improving symptom.

## 2020-05-18 NOTE — Assessment & Plan Note (Signed)
Likely functional related to senile calcification.  No significant stenosis noted.  Will be due to follow-up of mitral valve in 2022, can reassess.

## 2020-05-21 ENCOUNTER — Encounter: Payer: Self-pay | Admitting: Physical Therapy

## 2020-05-21 ENCOUNTER — Other Ambulatory Visit: Payer: Self-pay

## 2020-05-21 ENCOUNTER — Ambulatory Visit: Payer: Medicare Other | Admitting: Physical Therapy

## 2020-05-21 DIAGNOSIS — R252 Cramp and spasm: Secondary | ICD-10-CM | POA: Diagnosis not present

## 2020-05-21 DIAGNOSIS — M546 Pain in thoracic spine: Secondary | ICD-10-CM | POA: Diagnosis not present

## 2020-05-21 DIAGNOSIS — R293 Abnormal posture: Secondary | ICD-10-CM | POA: Diagnosis not present

## 2020-05-21 NOTE — Therapy (Signed)
[facility] [address], [place], [place], [address] Phone: [phone]   Fax:  [phone]  Physical Therapy Treatment  Patient Details  Name: Adriana Hendricks MRN: [id number] Date of Birth: 1935-12-12 Referring Provider (PT): Garret Reddish, MD   Encounter Date: 05/21/2020  PT End of Session - 05/21/20 1357    Visit Number  2    Date for PT Re-Evaluation  07/08/20    Authorization Type  Medicare    Authorization - Number of Visits  2    Progress Note Due on Visit  10    PT Start Time  1147    PT Stop Time  1229    PT Time Calculation (min)  42 min    Activity Tolerance  Patient tolerated treatment well;No increased pain    Behavior During Therapy  WFL for tasks assessed/performed       Past Medical History:  Diagnosis Date  . Achilles tendon rupture   . Arthritis of hand, right   . Atrial flutter with rapid ventricular response ([place])   . Carpal tunnel syndrome 09/04/2013  . Collagenous colitis 2005  . Colon polyps 2010   Tubular adenoma and hyperplastic  . Dental infection 10/25/2014  . Diverticulosis   . Esophageal stenosis   . Essential tremor   . GERD (gastroesophageal reflux disease)    hx hiatal hernia, hx esophagitis, hx stricture  . Hiatal hernia   . Hypertension   . Lower extremity neuropathy 08/23/2013   On B12 therapy with numbness in the feet bilaterally no evidence of diabetes   . Ocular migraine    jagged vision, a few per month  . OSA (obstructive sleep apnea) 12/21/2016   on CPAP  . Peripheral neuropathy    treated by Dr Posey Pronto (07/2015)  . Persistent atrial fibrillation (HCC)    Rate control with beta-blocker and anticoagulated with warfarin  . Pleural effusion, left   . S/P Maze operation for atrial fibrillation 06/12/2019   Complete bilateral atrial lesion set using cryothermy and bipolar radiofrequency ablation with clipping of LA appendage via right mini thoracotomy approach  . S/P  minimally invasive tricuspid valve repair 06/12/2019   Complex valvuloplasty including autologous pericardial patch augmentation of anterior and posterior leaflets with 28 mm Edwards mc3 ring annuloplasty via right mini thoracotomy approach  . Severe tricuspid regurgitation 07/2018   Noted Aug 2019 during a fib workup. Severe LAE as well  . Sinus node dysfunction [facility])     Past Surgical History:  Procedure Laterality Date  . 48 hr Holter Monitor  07/2018   Persistent Afib (rate 33 - 124 bpm)  . APPENDECTOMY    . CARDIAC CATHETERIZATION    . CARDIOVERSION N/A 10/02/2018   Procedure: CARDIOVERSION;  Surgeon: Acie Fredrickson, Wonda Cheng, MD;  Location: [facility] ENDOSCOPY;  Service: Cardiovascular;  Laterality: N/A;  . CARPAL TUNNEL RELEASE    . CATARACT EXTRACTION    . EXCISION MORTON'S NEUROMA     Right foot  . EYE SURGERY     Cataracts with implants-bilateral  . INTRAOCULAR LENS IMPLANT, SECONDARY    . IR THORACENTESIS ASP PLEURAL SPACE W/IMG GUIDE  07/10/2019  . IR THORACENTESIS ASP PLEURAL SPACE W/IMG GUIDE  07/10/2019  . IR THORACENTESIS ASP PLEURAL SPACE W/IMG GUIDE  07/26/2019  . MINIMALLY INVASIVE MAZE PROCEDURE N/A 06/12/2019   Procedure: MINIMALLY INVASIVE MAZE PROCEDURE;  Surgeon: Rexene Alberts, MD;  Location: [place];  Service: Open Heart Surgery;  Laterality: N/A;  .  MINIMALLY INVASIVE TRICUSPID VALVE REPAIR Right 06/12/2019   Procedure: MINIMALLY INVASIVE TRICUSPID VALVE REPAIR USING EDWARDS MC3 T28 ANNULOPLASTY RING, INSERTION TEMPORARY TRANSVENOUS AV PACING LEAD;  Surgeon: Rexene Alberts, MD;  Location: [place];  Service: Open Heart Surgery;  Laterality: Right;  . Moles removed    . MOUTH SURGERY     (For Exostosis)  . NUCLEAR  07/2018   EF 71 %. LOW RISK - no ischemia or Infarct.   Marland Kitchen RIGHT/LEFT HEART CATH AND CORONARY ANGIOGRAPHY N/A 01/16/2019   Procedure: RIGHT/LEFT HEART CATH AND CORONARY ANGIOGRAPHY;  Surgeon: Leonie Man, MD;  Location: [place] CV LAB;; Angiographically normal  coronary arteries.  Normal LVEDP. Upper Limit of Normal PAP~23 mmHg & PCWP 18 mmHg.  LVEDP of 7 mmHg. -With mean PAP of 23 mmHg there is a TPG suggesting a primary pulmonary etiology.  Marland Kitchen ROTATOR CUFF REPAIR    . TEE WITHOUT CARDIOVERSION N/A 01/16/2019   Procedure: TRANSESOPHAGEAL ECHOCARDIOGRAM (TEE);  Surgeon: Sueanne Margarita, MD;  Location: [facility] ENDOSCOPY;;  Severe TR due to poor coaptation of the leaflets from annular dilation.  Mild to moderate mitral vegetation with mildly restricted mobility of the posterior leaflet.  EF 55-60% with no R WMA.  Severe RA and LA dilation.  . TEE WITHOUT CARDIOVERSION N/A 06/12/2019   Procedure: TRANSESOPHAGEAL ECHOCARDIOGRAM (TEE);  Surgeon: Rexene Alberts, MD;  Location: [place];  Service: Open Heart Surgery;  Laterality: N/A;  . TONSILLECTOMY    . TRANSTHORACIC ECHOCARDIOGRAM  08/05/2019   First postop TAVR: EF 60 to 65%. Mod concentric LVH. Grade III DD (? w/ Mild LA dilation).  Rheumatic MV w/ mod thickening & Ca2+.  Anterior leaflet has mild doming but no MVP.  Mild-mod MR and mild to mod MS (mean MVA gradient 7 mmHg).  ~ functional bicuspid AoV w/ Mod Sclerosis - no AS.  Thoracic Aorta (4.1 & 3.7 @ Sinus of  V, prox Ascending) - mild dilation  . TRANSTHORACIC ECHOCARDIOGRAM  08/02/2018   Normal LV Fxn (EF 60-65%) no RWMA.  Severe LA dilation & Severe TR.    There were no vitals filed for this visit.  Subjective Assessment - 05/21/20 1354    Subjective  Pt states that things are going well. No pain currently, but she will have some discomfort when lifting objects.    Pertinent History  cardiac surgery 06/11/20    Diagnostic tests  many tests to rule out internal causes- all negative    Currently in Pain?  No/denies    Pain Onset  More than a month ago                        [facility] Adult PT Treatment/Exercise - 05/21/20 0001      Exercises   Exercises  Neck      Neck Exercises: Standing   Other Standing Exercises  Rows with yellow TB  x10 reps       Neck Exercises: Seated   Other Seated Exercise  thoracic extension x10 reps; thoracic rotation Rt and Lt x10 reps       Manual Therapy   Manual therapy comments  STM Rt latissimus; thoracic extension mobilization with movement mid thoracic spine x10 reps; rotation each direction x10 reps with PT overpressure       Neck Exercises: Stretches   Other Neck Stretches  Rt lat stretch in doorway 3x20 sec ; B pec stretch 3x20 sec in doorway  Trigger Point Dry Needling - 05/21/20 0001    Consent Given?  Yes    Education Handout Provided  Previously provided    Muscles Treated Upper Quadrant  Latissimus dorsi    Latissimus dorsi Response  Twitch response elicited;Palpable increased muscle length   Rt            PT Short Term Goals - 05/13/20 1310      PT SHORT TERM GOAL #1   Title  be independent in initial HEP    Time  4    Period  Weeks    Status  New    Target Date  06/10/20      PT SHORT TERM GOAL #2   Title  report a 30% reduction in the frequency and intensity of Rt scapular pain with ADLs and self-care    Baseline  --    Time  4    Period  Weeks    Status  New    Target Date  06/10/20        PT Long Term Goals - 05/13/20 1314      PT LONG TERM GOAL #1   Title  be independent in advanced HEP    Time  8    Period  Weeks    Status  New    Target Date  07/08/20      PT LONG TERM GOAL #2   Title  report a 60% reduction in the frequency and intensity of scapular pain with ADLs and self-care    Baseline  --    Time  8    Period  Weeks    Status  New    Target Date  07/08/20      PT LONG TERM GOAL #3   Title  lift over head without limitation    Baseline  --    Time  8    Period  Weeks    Status  New    Target Date  07/08/20      PT LONG TERM GOAL #4   Title  sleep all night without waking due to thoracic pain    Time  8    Period  Weeks    Status  New    Target Date  07/08/20      PT LONG TERM GOAL #5   Title  reduce FOTO to  < or = to 33% limitation    Baseline  ---    Time  8    Period  Weeks    Status  New    Target Date  07/08/20            Plan - 05/21/20 1358    Clinical Impression Statement  Pt arrived with reports of improved thoracic pain following dry needling treatment last session. Pt has been completing her HEP without increase in pain, but she notes occasional Rt sided chest discomfort when lifting objects. Pt has palpable trigger points in the Rt latissimus and restricted movement compared to the Lt. Pt was agreeable to dry needling, with several twitch responses noted. PT also completed thoracic extension and rotation mobilization with movement to increase flexibility. Pt demonstrated understanding of HEP updates and denied increase in pain end of session.    Personal Factors and Comorbidities  Comorbidity 1    Comorbidities  cardiac surgery    Examination-Activity Limitations  Carry;Lift    Stability/Clinical Decision Making  Stable/Uncomplicated    Rehab Potential  Excellent    PT Frequency  1x / week    PT Duration  8 weeks    PT Treatment/Interventions  ADLs/Self Care Home Management;Cryotherapy;Moist Heat;Traction;Electrical Stimulation;Functional mobility training;Neuromuscular re-education;Therapeutic exercise;Therapeutic activities;Patient/family education;Manual techniques;Passive range of motion;Dry needling;Taping    PT Next Visit Plan  repeat dry needling to Rt thoracic if needed; thoracic strengthening    PT Home Exercise Plan  Access Code: [place] and Agree with Plan of Care  Patient       Patient will benefit from skilled therapeutic intervention in order to improve the following deficits and impairments:  Decreased activity tolerance, Impaired flexibility, Impaired UE functional use, Improper body mechanics, Increased muscle spasms, Hypomobility  Visit Diagnosis: Abnormal posture  Pain in thoracic spine  Cramp and spasm     Problem List Patient  Active Problem List   Diagnosis Date Noted  . Palpitations 11/10/2019  . Pleural effusion, left   . Sinus node dysfunction (HCC)   . S/P minimally invasive tricuspid valve repair 06/12/2019  . S/P Maze operation for atrial fibrillation 06/12/2019  . 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 Bicuspid aortic valve 01/28/2019  . Tricuspid valve insufficiency   . Severe tricuspid regurgitation   . Long term (current) use of anticoagulants 08/28/2018  . Persistent atrial fibrillation ([place])   . DOE (dyspnea on exertion) 07/26/2018  . Osteopenia 03/13/2017  . Ocular migraine 03/13/2017  . OSA (obstructive sleep apnea) 12/21/2016  . IBS (irritable bowel syndrome) 09/12/2016  . History of adenomatous polyp of colon 09/12/2016  . Former smoker 09/12/2016  . Carpal tunnel syndrome 09/04/2013  . Lower extremity neuropathy 08/23/2013  . Achilles tendon tear 01/17/2011  . Hyperlipemia 01/07/2010  . GERD with stricture 06/19/2009  . ACNE ROSACEA 04/03/2008  . Essential tremor 11/16/2007  . Essential hypertension 11/16/2007    2:00 PM,05/21/20 Sherol Dade PT, DPT [facility]  [facility] [address] [place], [place], [address] Phone: [phone]   Fax:  [phone]  Name: Adriana Hendricks MRN: [id number] Date of Birth: 03/23/1935

## 2020-05-22 IMAGING — DX PORTABLE ABDOMEN - 1 VIEW
1 series · 1 of 1 positions shown · non-contrast
Comparison: Chest radiograph earlier this day at 3553 hour

CLINICAL DATA: Encounter for orogastric tube placement.

EXAM:
PORTABLE ABDOMEN - 1 VIEW

[abdomen kub]
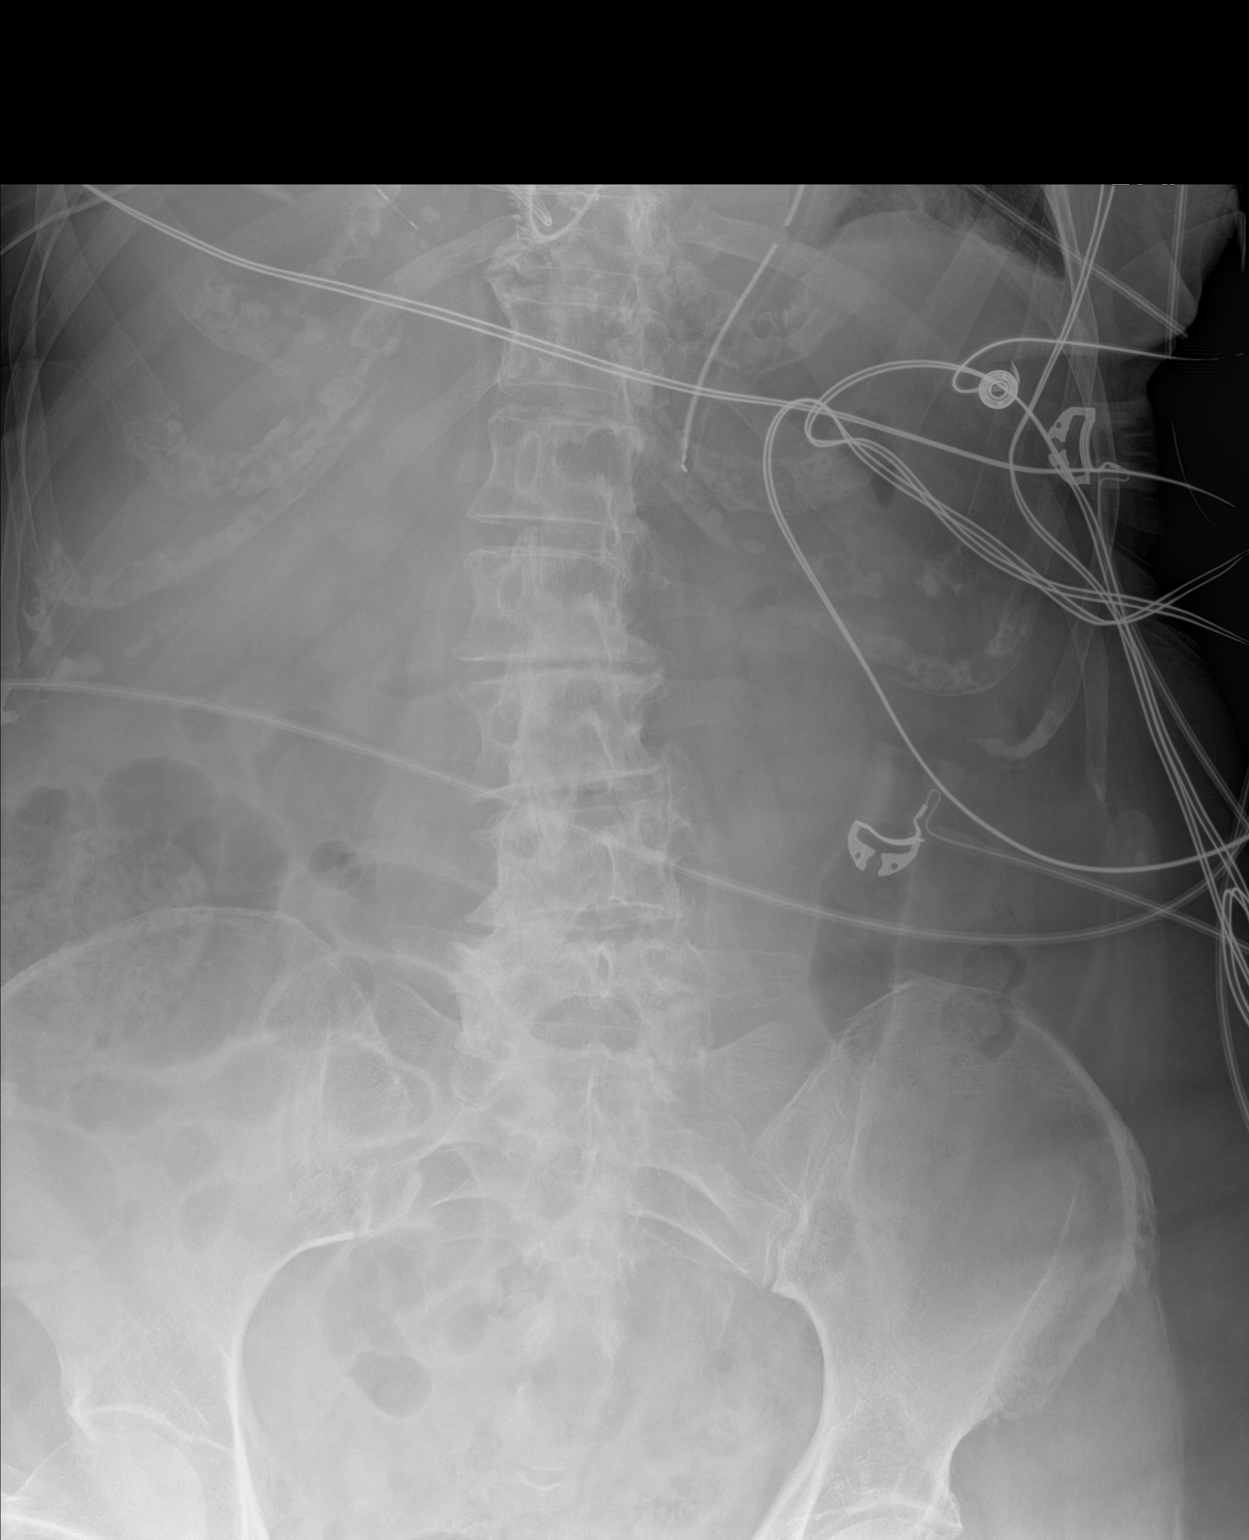

[1 of 1 positions shown; findings below may reference images not displayed]

FINDINGS: Tip of the enteric tube is below the diaphragm in the stomach,
side-port likely in the region of the gastroesophageal junction.
Nonobstructive bowel gas pattern in the upper abdomen.
IMPRESSION: Tip of the enteric tube below the diaphragm in the stomach,
side-port likely in the region of the gastroesophageal junction.
Recommend advancement of approximately 5 cm for optimal placement.

## 2020-05-23 IMAGING — DX PORTABLE CHEST - 1 VIEW
1 series · 1 of 1 positions shown · non-contrast
Comparison: 06/12/2019 and older exams.

CLINICAL DATA: Follow-up exam.  Status post tricuspid valve repair.

EXAM:
PORTABLE CHEST 1 VIEW

[chest ap]
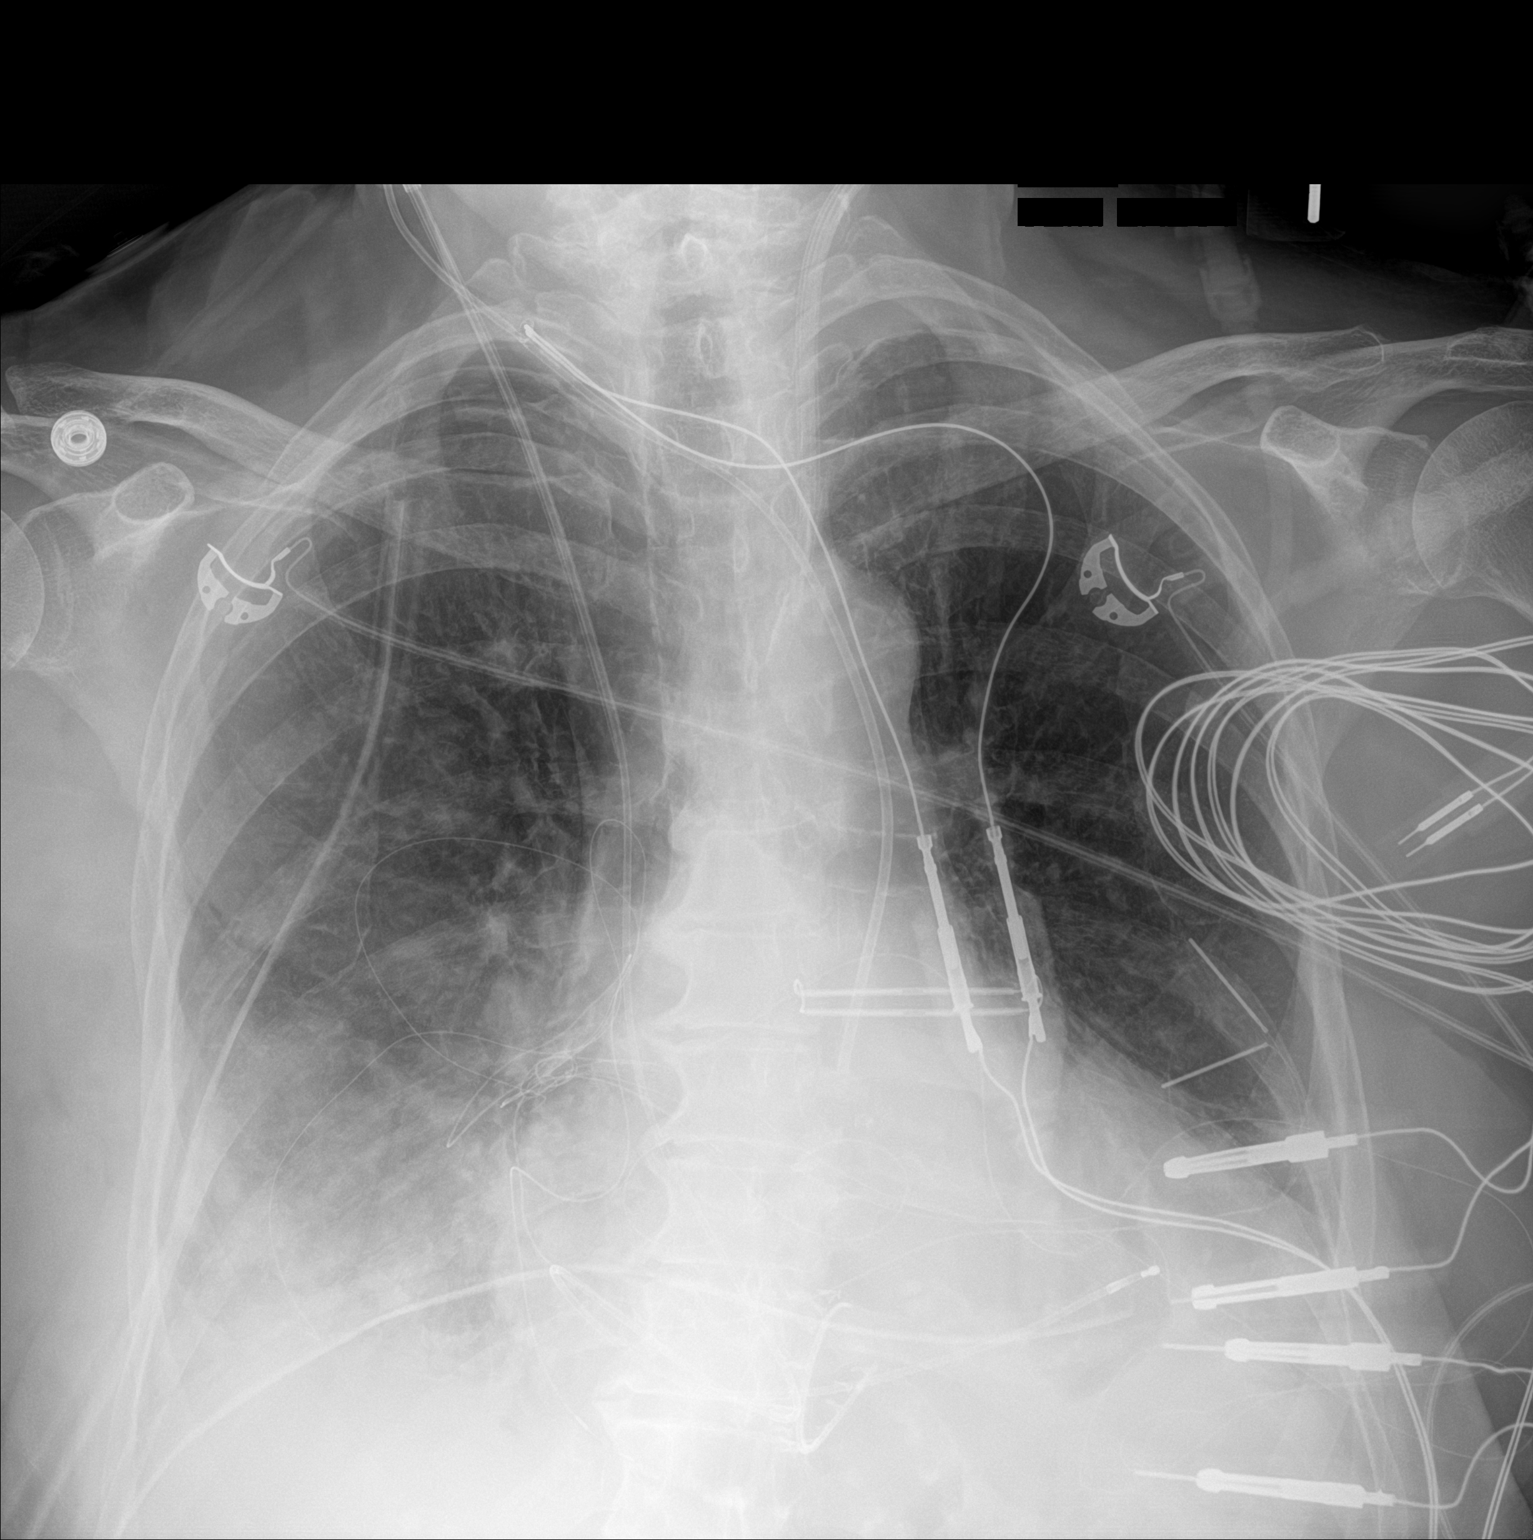

[1 of 1 positions shown; findings below may reference images not displayed]

FINDINGS: There is increased opacity in the right lung base compared to the
previous day's exam. Opacity now mostly obscures the right
hemidiaphragm. There is persistent opacity at the left lung base.

No pneumothorax. Stable right-sided chest tube, tip near the right
apex.

No mediastinal widening.

Endotracheal tube and nasogastric tube have been removed. Stable
appearance of bilateral internal jugular central lines and the right
internal jugular pacing line.
IMPRESSION: 1. Increased opacity in the right lung base compared to the most
recent prior exam. This is likely a combination of atelectasis and
pleural fluid. A component of asymmetric edema is possible.
2. Status post removal of the endotracheal tube and nasogastric tube
since the prior study. Remaining support apparatus is stable.
3. No pneumothorax.

## 2020-05-24 IMAGING — DX PORTABLE CHEST - 1 VIEW
2 series · 2 of 2 positions shown · non-contrast
Comparison: 06/13/2019.

CLINICAL DATA: Tricuspid valve repair.  Sore chest.

EXAM:
PORTABLE CHEST 1 VIEW

[chest ap (1 of 2)]
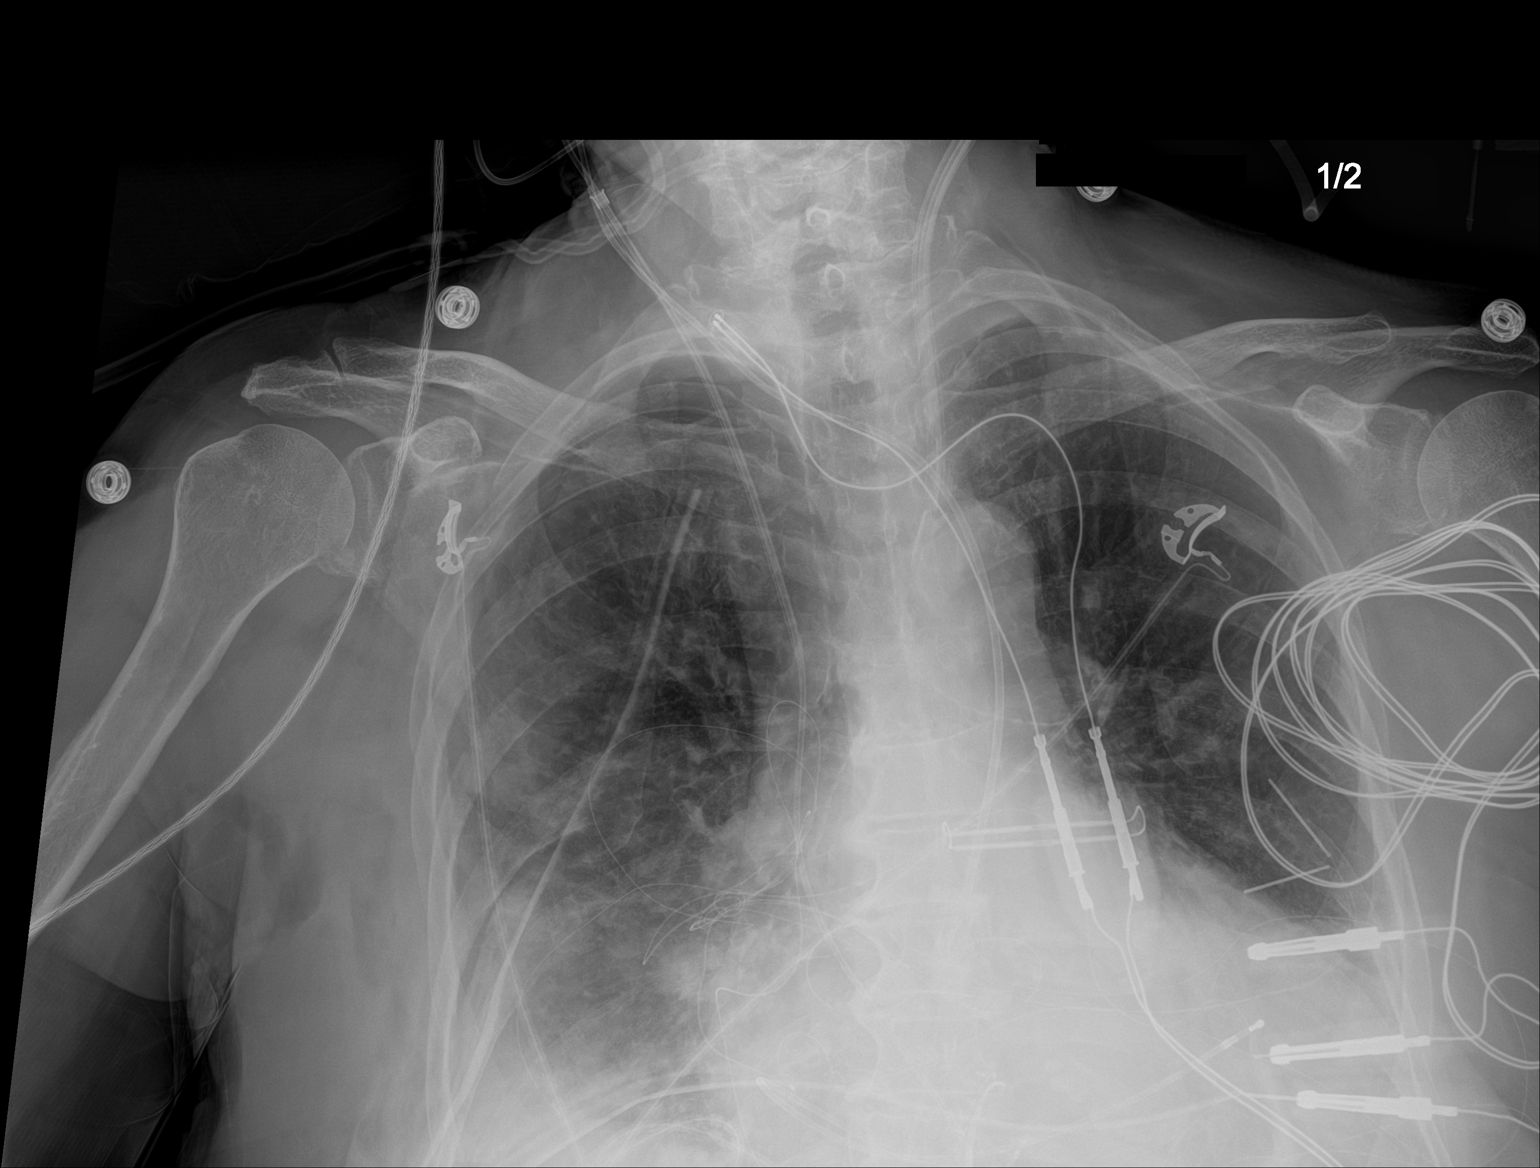

[chest ap (2 of 2)]
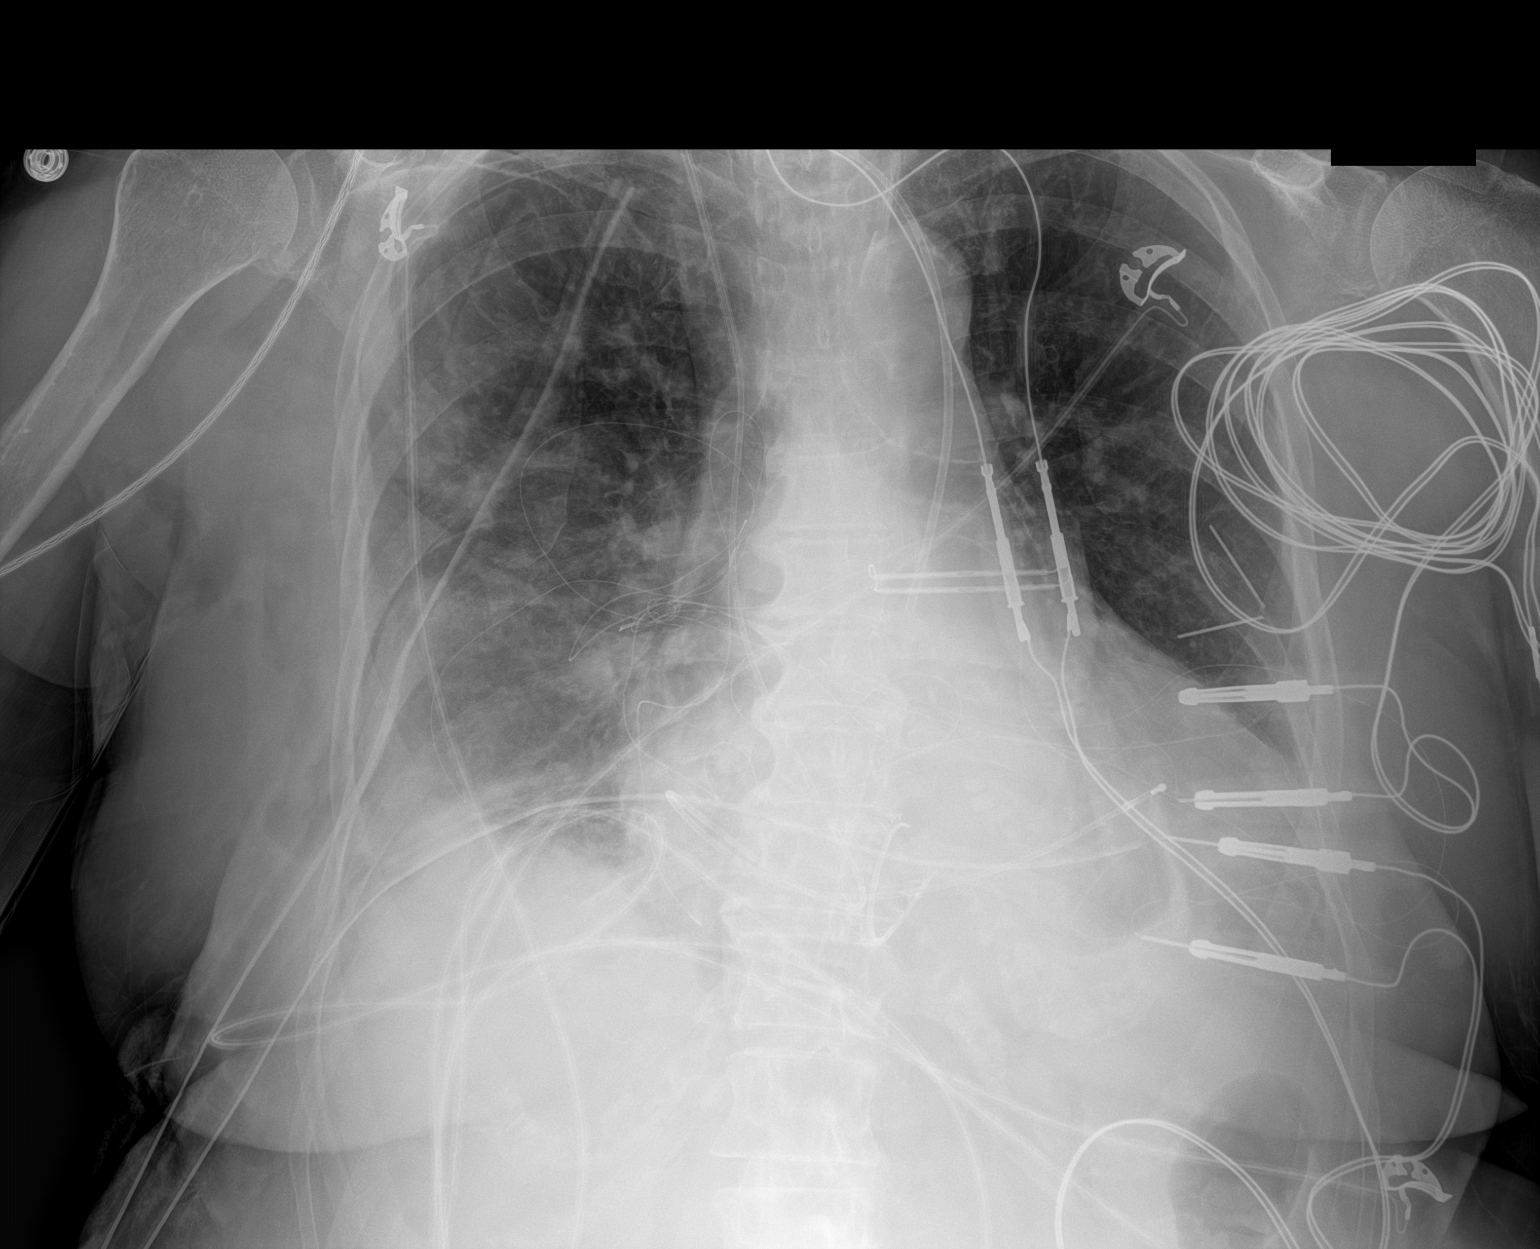

[2 of 2 positions shown; findings below may reference images not displayed]

FINDINGS: Right chest tube in stable position. Mediastinal drainage tubing in
stable position. Right IJ pacing line in stable position. Left IJ
line in stable position. Again catheter is noted coursing from the
right supraclavicular region and projected over the left mediastinum
for which clinical correlation suggested, this is of uncertain
etiology and intravascular location again cannot be confirmed. Prior
cardiac valve replacement. Stable cardiomegaly. Improved aeration in
the lung bases. Mild infiltrates/edema right mid lung again noted.
No prominent pleural effusion. Degenerative. Changes scoliosis
thoracic spine mild right chest wall subcutaneous emphysema.
IMPRESSION: 1. Lines and tubes including right chest tube in unchanged. See
complete discussion above. No pneumothorax.

2.  Prior cardiac valve replacement.  Stable cardiomegaly.

3. Improved aeration in the lung bases. Mild infiltrates/edema right
mid lung again noted. No prominent pleural effusion.

## 2020-05-26 IMAGING — DX PORTABLE CHEST - 1 VIEW
1 series · 1 of 1 positions shown · non-contrast
Comparison: 06/14/2019 chest radiograph.

CLINICAL DATA: Atelectasis

EXAM:
PORTABLE CHEST 1 VIEW

[chest ap]
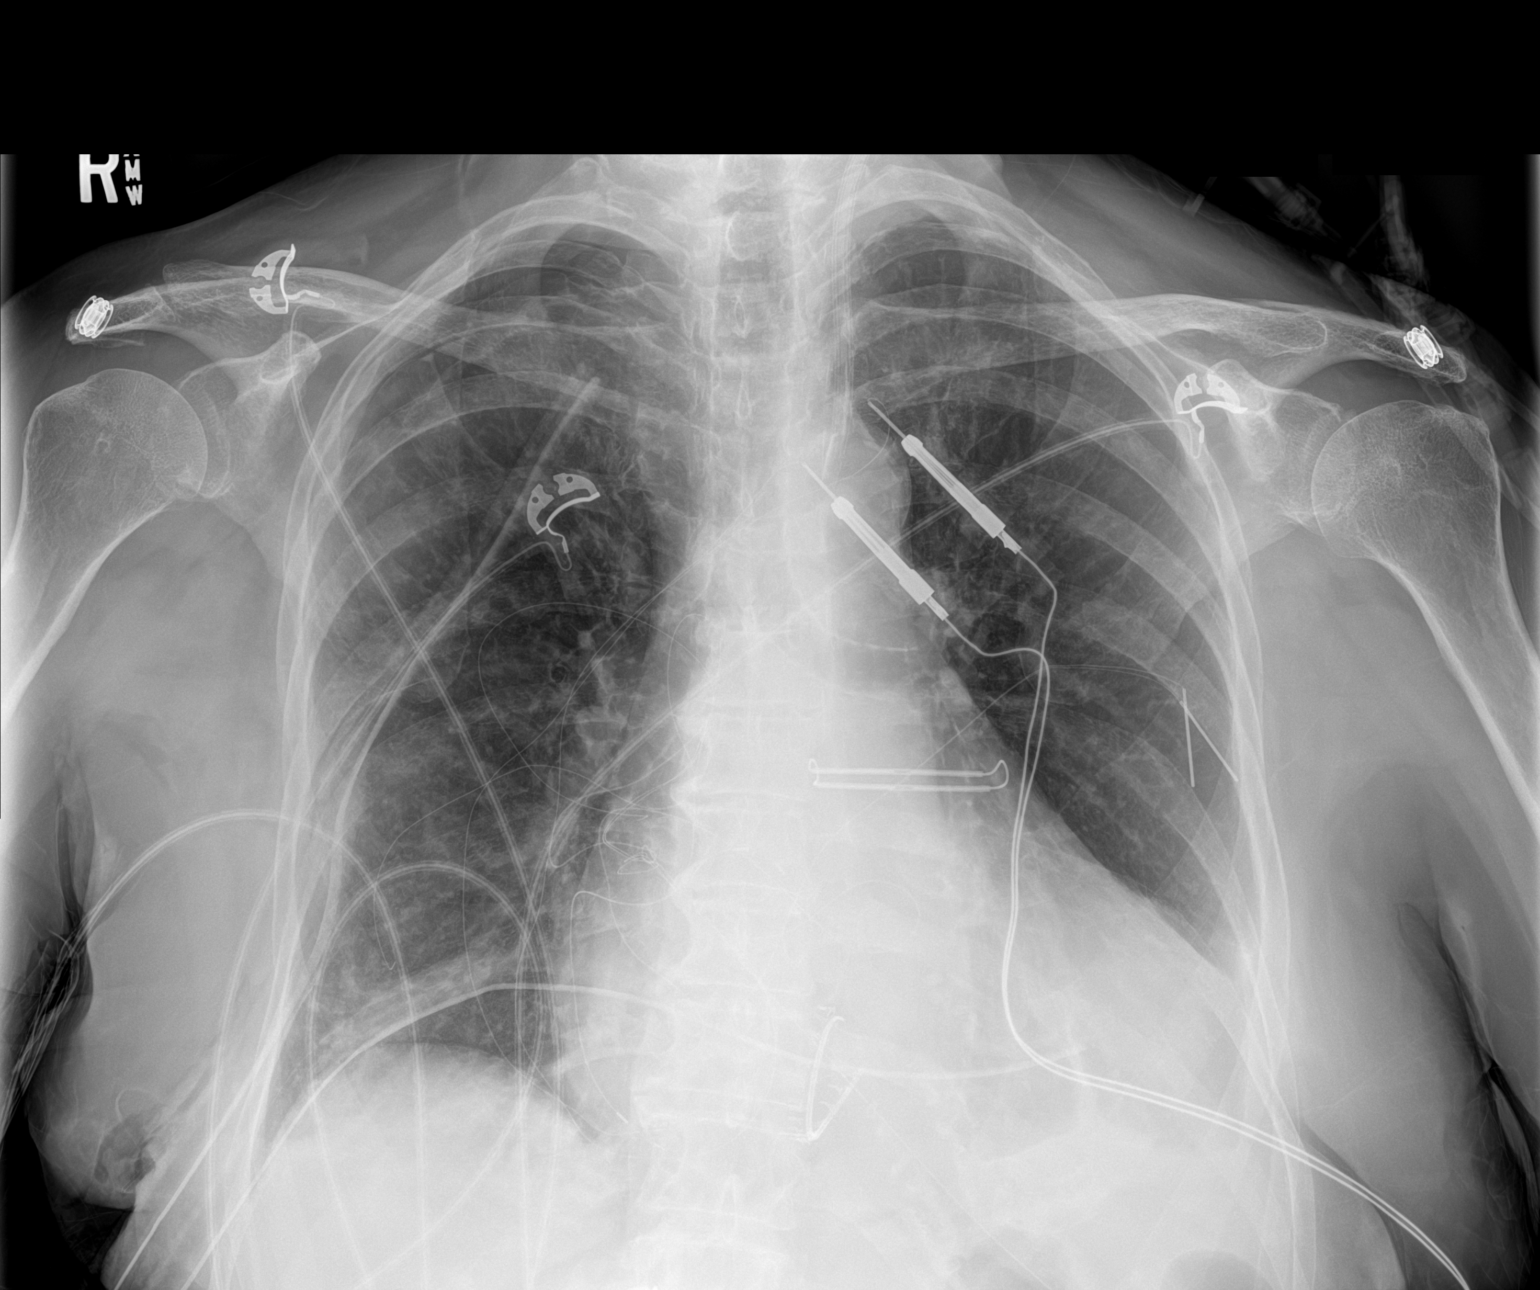

[1 of 1 positions shown; findings below may reference images not displayed]

FINDINGS: Stable position of right apical chest tube and mediastinal drain.
Stable cardiomediastinal silhouette with mild cardiomegaly. Small
right apical pneumothorax, approximately 5-10%, stable. No left
pneumothorax. Stable small left pleural effusion. No right pleural
effusion. No overt pulmonary edema. Improved aeration at the lung
bases with persistent mild bibasilar atelectasis.
IMPRESSION: 1. Small right apical pneumothorax, stable.
2. Stable small left pleural effusion.
3. Stable cardiomegaly without overt pulmonary edema.
4. Improved aeration at the lung bases with persistent mild
bibasilar atelectasis.

## 2020-05-28 ENCOUNTER — Ambulatory Visit: Payer: Medicare Other | Attending: Family Medicine | Admitting: Physical Therapy

## 2020-05-28 ENCOUNTER — Other Ambulatory Visit: Payer: Self-pay

## 2020-05-28 ENCOUNTER — Encounter: Payer: Self-pay | Admitting: Physical Therapy

## 2020-05-28 DIAGNOSIS — M6281 Muscle weakness (generalized): Secondary | ICD-10-CM | POA: Insufficient documentation

## 2020-05-28 DIAGNOSIS — M545 Low back pain: Secondary | ICD-10-CM | POA: Diagnosis present

## 2020-05-28 DIAGNOSIS — R252 Cramp and spasm: Secondary | ICD-10-CM | POA: Diagnosis present

## 2020-05-28 DIAGNOSIS — M546 Pain in thoracic spine: Secondary | ICD-10-CM | POA: Diagnosis present

## 2020-05-28 DIAGNOSIS — R293 Abnormal posture: Secondary | ICD-10-CM | POA: Insufficient documentation

## 2020-05-28 NOTE — Therapy (Signed)
[facility] [address] [place], [place], [address] Phone: [phone]   Fax:  [phone]  Physical Therapy Treatment  Patient Details  Name: Adriana Hendricks MRN: [id number] Date of Birth: March 24, 1935 Referring Provider (PT): Garret Reddish, MD   Encounter Date: 05/28/2020  PT End of Session - 05/28/20 1252    Visit Number  3    Date for PT Re-Evaluation  07/08/20    Authorization Type  Medicare    Authorization - Number of Visits  3    Progress Note Due on Visit  10    PT Start Time  [id number]    PT Stop Time  1230    PT Time Calculation (min)  45 min    Activity Tolerance  Patient tolerated treatment well;No increased pain    Behavior During Therapy  WFL for tasks assessed/performed       Past Medical History:  Diagnosis Date  . Achilles tendon rupture   . Arthritis of hand, right   . Atrial flutter with rapid ventricular response ([place])   . Carpal tunnel syndrome 09/04/2013  . Collagenous colitis 2005  . Colon polyps 2010   Tubular adenoma and hyperplastic  . Dental infection 10/25/2014  . Diverticulosis   . Esophageal stenosis   . Essential tremor   . GERD (gastroesophageal reflux disease)    hx hiatal hernia, hx esophagitis, hx stricture  . Hiatal hernia   . Hypertension   . Lower extremity neuropathy 08/23/2013   On B12 therapy with numbness in the feet bilaterally no evidence of diabetes   . Ocular migraine    jagged vision, a few per month  . OSA (obstructive sleep apnea) 12/21/2016   on CPAP  . Peripheral neuropathy    treated by Dr Posey Pronto (07/2015)  . Persistent atrial fibrillation (HCC)    Rate control with beta-blocker and anticoagulated with warfarin  . Pleural effusion, left   . S/P Maze operation for atrial fibrillation 06/12/2019   Complete bilateral atrial lesion set using cryothermy and bipolar radiofrequency ablation with clipping of LA appendage via right mini thoracotomy approach  . S/P  minimally invasive tricuspid valve repair 06/12/2019   Complex valvuloplasty including autologous pericardial patch augmentation of anterior and posterior leaflets with 28 mm Edwards mc3 ring annuloplasty via right mini thoracotomy approach  . Severe tricuspid regurgitation 07/2018   Noted Aug 2019 during a fib workup. Severe LAE as well  . Sinus node dysfunction [facility])     Past Surgical History:  Procedure Laterality Date  . 48 hr Holter Monitor  07/2018   Persistent Afib (rate 33 - 124 bpm)  . APPENDECTOMY    . CARDIAC CATHETERIZATION    . CARDIOVERSION N/A 10/02/2018   Procedure: CARDIOVERSION;  Surgeon: Acie Fredrickson, Wonda Cheng, MD;  Location: [facility] ENDOSCOPY;  Service: Cardiovascular;  Laterality: N/A;  . CARPAL TUNNEL RELEASE    . CATARACT EXTRACTION    . EXCISION MORTON'S NEUROMA     Right foot  . EYE SURGERY     Cataracts with implants-bilateral  . INTRAOCULAR LENS IMPLANT, SECONDARY    . IR THORACENTESIS ASP PLEURAL SPACE W/IMG GUIDE  07/10/2019  . IR THORACENTESIS ASP PLEURAL SPACE W/IMG GUIDE  07/10/2019  . IR THORACENTESIS ASP PLEURAL SPACE W/IMG GUIDE  07/26/2019  . MINIMALLY INVASIVE MAZE PROCEDURE N/A 06/12/2019   Procedure: MINIMALLY INVASIVE MAZE PROCEDURE;  Surgeon: Rexene Alberts, MD;  Location: [place];  Service: Open Heart Surgery;  Laterality: N/A;  .  MINIMALLY INVASIVE TRICUSPID VALVE REPAIR Right 06/12/2019   Procedure: MINIMALLY INVASIVE TRICUSPID VALVE REPAIR USING EDWARDS MC3 T28 ANNULOPLASTY RING, INSERTION TEMPORARY TRANSVENOUS AV PACING LEAD;  Surgeon: Rexene Alberts, MD;  Location: [place];  Service: Open Heart Surgery;  Laterality: Right;  . Moles removed    . MOUTH SURGERY     (For Exostosis)  . NUCLEAR  07/2018   EF 71 %. LOW RISK - no ischemia or Infarct.   Marland Kitchen RIGHT/LEFT HEART CATH AND CORONARY ANGIOGRAPHY N/A 01/16/2019   Procedure: RIGHT/LEFT HEART CATH AND CORONARY ANGIOGRAPHY;  Surgeon: Leonie Man, MD;  Location: [facility] CV LAB;; Angiographically normal  coronary arteries.  Normal LVEDP. Upper Limit of Normal PAP~23 mmHg & PCWP 18 mmHg.  LVEDP of 7 mmHg. -With mean PAP of 23 mmHg there is a TPG suggesting a primary pulmonary etiology.  Marland Kitchen ROTATOR CUFF REPAIR    . TEE WITHOUT CARDIOVERSION N/A 01/16/2019   Procedure: TRANSESOPHAGEAL ECHOCARDIOGRAM (TEE);  Surgeon: Sueanne Margarita, MD;  Location: [facility] ENDOSCOPY;;  Severe TR due to poor coaptation of the leaflets from annular dilation.  Mild to moderate mitral vegetation with mildly restricted mobility of the posterior leaflet.  EF 55-60% with no R WMA.  Severe RA and LA dilation.  . TEE WITHOUT CARDIOVERSION N/A 06/12/2019   Procedure: TRANSESOPHAGEAL ECHOCARDIOGRAM (TEE);  Surgeon: Rexene Alberts, MD;  Location: [place];  Service: Open Heart Surgery;  Laterality: N/A;  . TONSILLECTOMY    . TRANSTHORACIC ECHOCARDIOGRAM  08/05/2019   First postop TAVR: EF 60 to 65%. Mod concentric LVH. Grade III DD (? w/ Mild LA dilation).  Rheumatic MV w/ mod thickening & Ca2+.  Anterior leaflet has mild doming but no MVP.  Mild-mod MR and mild to mod MS (mean MVA gradient 7 mmHg).  ~ functional bicuspid AoV w/ Mod Sclerosis - no AS.  Thoracic Aorta (4.1 & 3.7 @ Sinus of  V, prox Ascending) - mild dilation  . TRANSTHORACIC ECHOCARDIOGRAM  08/02/2018   Normal LV Fxn (EF 60-65%) no RWMA.  Severe LA dilation & Severe TR.    There were no vitals filed for this visit.  Subjective Assessment - 05/28/20 1150    Subjective  Pt states that things are going well. She has intermittent sharp upper back pain with movement. No pain at rest currently.    Pertinent History  cardiac surgery 06/11/20    Diagnostic tests  many tests to rule out internal causes- all negative    Currently in Pain?  No/denies    Pain Onset  More than a month ago                        [facility] Adult PT Treatment/Exercise - 05/28/20 0001      Neck Exercises: Machines for Strengthening   UBE (Upper Arm Bike)  L1 x2 min forward, PT present to  discuss session      Neck Exercises: Supine   Other Supine Exercise  breathing with lower rib expansion, PT and TB cuing required to avoid shoulder shrug and rib flaring     Other Supine Exercise  horizontal abduction with red TB 2x10 reps; B shoulder ER with yellow TB 2x10 reps (+) pain Rt scapula region with this      Manual Therapy   Manual Therapy  Soft tissue mobilization    Soft tissue mobilization  STM mid and upper thoracic paraspinals, Rt middle trap/rhomboids, Rt infraspinatus, teres minor/major  Trigger Point Dry Needling - 05/28/20 0001    Consent Given?  Yes    Education Handout Provided  Previously provided    Muscles Treated Upper Quadrant  Longissimus    Longissimus Response  Twitch response elicited;Palpable increased muscle length   Rt   Middle trapezius Response  Twitch response elicited;Palpable increased muscle length   Rt          PT Education - 05/28/20 1251    Education Details  technique with therex    Person(s) Educated  Patient    Methods  Explanation;Verbal cues;Handout    Comprehension  Verbalized understanding;Returned demonstration       PT Short Term Goals - 05/13/20 1310      PT SHORT TERM GOAL #1   Title  be independent in initial HEP    Time  4    Period  Weeks    Status  New    Target Date  06/10/20      PT SHORT TERM GOAL #2   Title  report a 30% reduction in the frequency and intensity of Rt scapular pain with ADLs and self-care    Baseline  --    Time  4    Period  Weeks    Status  New    Target Date  06/10/20        PT Long Term Goals - 05/13/20 1314      PT LONG TERM GOAL #1   Title  be independent in advanced HEP    Time  8    Period  Weeks    Status  New    Target Date  07/08/20      PT LONG TERM GOAL #2   Title  report a 60% reduction in the frequency and intensity of scapular pain with ADLs and self-care    Baseline  --    Time  8    Period  Weeks    Status  New    Target Date  07/08/20      PT  LONG TERM GOAL #3   Title  lift over head without limitation    Baseline  --    Time  8    Period  Weeks    Status  New    Target Date  07/08/20      PT LONG TERM GOAL #4   Title  sleep all night without waking due to thoracic pain    Time  8    Period  Weeks    Status  New    Target Date  07/08/20      PT LONG TERM GOAL #5   Title  reduce FOTO to < or = to 33% limitation    Baseline  ---    Time  8    Period  Weeks    Status  New    Target Date  07/08/20            Plan - 05/28/20 1252    Clinical Impression Statement  Pt had return of Rt upper back/scapula pain over the weekend. She has palpable trigger points and muscle knotting in the Rt thoracic paraspinals. Focused initially on increasing scapula strength and proper breathing with rib expansion. Pt had increased pain with shoulder external rotation with scapula squeeze. Ended session with dry needling and soft tissue mobilization to the mid and upper thoracic region. Pt reported good relief following this. Will continue with current POC.    Personal Factors and  Comorbidities  Comorbidity 1    Comorbidities  cardiac surgery    Examination-Activity Limitations  Carry;Lift    Stability/Clinical Decision Making  Stable/Uncomplicated    Rehab Potential  Excellent    PT Frequency  1x / week    PT Duration  8 weeks    PT Treatment/Interventions  ADLs/Self Care Home Management;Cryotherapy;Moist Heat;Traction;Electrical Stimulation;Functional mobility training;Neuromuscular re-education;Therapeutic exercise;Therapeutic activities;Patient/family education;Manual techniques;Passive range of motion;Dry needling;Taping    PT Next Visit Plan  repeat dry needling to Rt thoracic if needed; thoracic strengthening; scap strengthening    PT Home Exercise Plan  Access Code: Branch and Agree with Plan of Care  Patient       Patient will benefit from skilled therapeutic intervention in order to improve the following  deficits and impairments:  Decreased activity tolerance, Impaired flexibility, Impaired UE functional use, Improper body mechanics, Increased muscle spasms, Hypomobility  Visit Diagnosis: Abnormal posture  Pain in thoracic spine  Cramp and spasm     Problem List Patient Active Problem List   Diagnosis Date Noted  . Palpitations 11/10/2019  . Pleural effusion, left   . Sinus node dysfunction (HCC)   . S/P minimally invasive tricuspid valve repair 06/12/2019  . S/P Maze operation for atrial fibrillation 06/12/2019  . 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 Bicuspid aortic valve 01/28/2019  . Tricuspid valve insufficiency   . Severe tricuspid regurgitation   . Long term (current) use of anticoagulants 08/28/2018  . Persistent atrial fibrillation ([place])   . DOE (dyspnea on exertion) 07/26/2018  . Osteopenia 03/13/2017  . Ocular migraine 03/13/2017  . OSA (obstructive sleep apnea) 12/21/2016  . IBS (irritable bowel syndrome) 09/12/2016  . History of adenomatous polyp of colon 09/12/2016  . Former smoker 09/12/2016  . Carpal tunnel syndrome 09/04/2013  . Lower extremity neuropathy 08/23/2013  . Achilles tendon tear 01/17/2011  . Hyperlipemia 01/07/2010  . GERD with stricture 06/19/2009  . ACNE ROSACEA 04/03/2008  . Essential tremor 11/16/2007  . Essential hypertension 11/16/2007    12:57 PM,05/28/20 Sherol Dade PT, DPT [place] [facility] [place]  [facility] [address] [place], [place], [address] Phone: [phone]   Fax:  [phone]  Name: Adriana Hendricks MRN: [id number] Date of Birth: Dec 04, 1935

## 2020-05-28 NOTE — Patient Instructions (Signed)
Access Code: [email or web address]: [email or web address]: 05/27/2021Prepared by: [facility]  Standing Shoulder Posterior Capsule Stretch - 3 x daily - 7 x weekly - 1 sets - 3 reps - 20 hold  Sidelying Thoracic Rotation with Open Book - 2 x daily - 7 x weekly - 1-2 sets - 10 reps  Doorway Pec Stretch at 90 Degrees Abduction - 1 x daily - 7 x weekly - 3 reps - 20 seconds hold  Latissimus Dorsi Stretch at Wall - 1 x daily - 7 x weekly - 3 reps - 30 seconds hold  Supine Shoulder Horizontal Abduction with Resistance - 1 x daily - 7 x weekly - 10 reps - 3 sets  [facility] [address], [place], [place] [address] Phone # [phone] Fax [phone]

## 2020-05-29 ENCOUNTER — Other Ambulatory Visit: Payer: Self-pay

## 2020-05-29 ENCOUNTER — Ambulatory Visit (INDEPENDENT_AMBULATORY_CARE_PROVIDER_SITE_OTHER): Payer: Medicare Other | Admitting: Pharmacist Clinician (PhC)/ Clinical Pharmacy Specialist

## 2020-05-29 DIAGNOSIS — I4819 Other persistent atrial fibrillation: Secondary | ICD-10-CM

## 2020-05-29 DIAGNOSIS — Z7901 Long term (current) use of anticoagulants: Secondary | ICD-10-CM

## 2020-05-29 LAB — POCT INR: INR: 2.6 (ref 2.0–3.0)

## 2020-06-05 DIAGNOSIS — L239 Allergic contact dermatitis, unspecified cause: Secondary | ICD-10-CM | POA: Diagnosis not present

## 2020-06-05 DIAGNOSIS — R21 Rash and other nonspecific skin eruption: Secondary | ICD-10-CM | POA: Diagnosis not present

## 2020-06-05 DIAGNOSIS — D692 Other nonthrombocytopenic purpura: Secondary | ICD-10-CM | POA: Diagnosis not present

## 2020-06-09 DIAGNOSIS — T7849XA Other allergy, initial encounter: Secondary | ICD-10-CM | POA: Diagnosis not present

## 2020-06-09 DIAGNOSIS — S40861A Insect bite (nonvenomous) of right upper arm, initial encounter: Secondary | ICD-10-CM | POA: Diagnosis not present

## 2020-06-11 ENCOUNTER — Telehealth: Payer: Self-pay | Admitting: Cardiology

## 2020-06-11 NOTE — Telephone Encounter (Signed)
Follow Up:    Pt said she sent  Dr [name] a message yesterday in My-Chart. She would like for the nurse to read this and call her today please.

## 2020-06-11 NOTE — Telephone Encounter (Signed)
Called patient-  She states she sent a mychart message yesterday:  Dermatologist has prescribed hydroxyzine hcl 25 mg tab nor for severe rash/hives on back and arm.  Drug information sheet attached to prescription bottle cautions using this drug if "you have ever had a long QT on ECG".    Have I had long QT?  Bottle says to take 1 to 2 tablets as needed by mouth at bedtime for itching.  What do you recommend?      I did see on recent EKG it states prolonged QT- patient just wants to make sure it would be safe for her to take the medication because she is itching like crazy.   Thanks!   Did advise that Dr.Harding was on vacation and could be a delay in response.

## 2020-06-16 ENCOUNTER — Encounter: Payer: Self-pay | Admitting: Family Medicine

## 2020-06-16 NOTE — Telephone Encounter (Signed)
She does have borderline prolonged QT on EKG. Usually taking a medicine like hydroxyzine as needed for rash that is from before short period of time is not quite cause a problem.  It would not be something that we want to take on a routine basis but for a week or so is probably okay.  Glenetta Hew, MD

## 2020-06-16 NOTE — Telephone Encounter (Signed)
Spoke to patient. shes states she read  [employer]. She states the rash is a little better she has did not use medication ,but is aware she can use in the future

## 2020-06-17 ENCOUNTER — Other Ambulatory Visit: Payer: Self-pay

## 2020-06-17 ENCOUNTER — Ambulatory Visit: Payer: Medicare Other

## 2020-06-17 DIAGNOSIS — M6281 Muscle weakness (generalized): Secondary | ICD-10-CM

## 2020-06-17 DIAGNOSIS — R252 Cramp and spasm: Secondary | ICD-10-CM

## 2020-06-17 DIAGNOSIS — R293 Abnormal posture: Secondary | ICD-10-CM

## 2020-06-17 DIAGNOSIS — M546 Pain in thoracic spine: Secondary | ICD-10-CM

## 2020-06-17 DIAGNOSIS — M545 Low back pain, unspecified: Secondary | ICD-10-CM

## 2020-06-17 DIAGNOSIS — T7849XA Other allergy, initial encounter: Secondary | ICD-10-CM | POA: Diagnosis not present

## 2020-06-17 NOTE — Therapy (Signed)
[facility] [address], [place], [place], [address] Phone: [phone]   Fax:  [phone]  Physical Therapy Treatment  Patient Details  Name: Adriana Hendricks MRN: [id number] Date of Birth: 04-07-1935 Referring Provider (PT): Garret Reddish, MD   Encounter Date: 06/17/2020   PT End of Session - 06/17/20 0927    Visit Number 4    Date for PT Re-Evaluation 07/08/20    Authorization Type Medicare    Authorization - Number of Visits 4    Progress Note Due on Visit 10    PT Start Time 0846    PT Stop Time 0927    PT Time Calculation (min) 41 min    Activity Tolerance Patient tolerated treatment well;No increased pain    Behavior During Therapy WFL for tasks assessed/performed           Past Medical History:  Diagnosis Date  . Achilles tendon rupture   . Arthritis of hand, right   . Atrial flutter with rapid ventricular response ([place])   . Carpal tunnel syndrome 09/04/2013  . Collagenous colitis 2005  . Colon polyps 2010   Tubular adenoma and hyperplastic  . Dental infection 10/25/2014  . Diverticulosis   . Esophageal stenosis   . Essential tremor   . GERD (gastroesophageal reflux disease)    hx hiatal hernia, hx esophagitis, hx stricture  . Hiatal hernia   . Hypertension   . Lower extremity neuropathy 08/23/2013   On B12 therapy with numbness in the feet bilaterally no evidence of diabetes   . Ocular migraine    jagged vision, a few per month  . OSA (obstructive sleep apnea) 12/21/2016   on CPAP  . Peripheral neuropathy    treated by Dr Posey Pronto (07/2015)  . Persistent atrial fibrillation (HCC)    Rate control with beta-blocker and anticoagulated with warfarin  . Pleural effusion, left   . S/P Maze operation for atrial fibrillation 06/12/2019   Complete bilateral atrial lesion set using cryothermy and bipolar radiofrequency ablation with clipping of LA appendage via right mini thoracotomy approach  . S/P  minimally invasive tricuspid valve repair 06/12/2019   Complex valvuloplasty including autologous pericardial patch augmentation of anterior and posterior leaflets with 28 mm Edwards mc3 ring annuloplasty via right mini thoracotomy approach  . Severe tricuspid regurgitation 07/2018   Noted Aug 2019 during a fib workup. Severe LAE as well  . Sinus node dysfunction [facility])     Past Surgical History:  Procedure Laterality Date  . 48 hr Holter Monitor  07/2018   Persistent Afib (rate 33 - 124 bpm)  . APPENDECTOMY    . CARDIAC CATHETERIZATION    . CARDIOVERSION N/A 10/02/2018   Procedure: CARDIOVERSION;  Surgeon: Acie Fredrickson, Wonda Cheng, MD;  Location: [facility] ENDOSCOPY;  Service: Cardiovascular;  Laterality: N/A;  . CARPAL TUNNEL RELEASE    . CATARACT EXTRACTION    . EXCISION MORTON'S NEUROMA     Right foot  . EYE SURGERY     Cataracts with implants-bilateral  . INTRAOCULAR LENS IMPLANT, SECONDARY    . IR THORACENTESIS ASP PLEURAL SPACE W/IMG GUIDE  07/10/2019  . IR THORACENTESIS ASP PLEURAL SPACE W/IMG GUIDE  07/10/2019  . IR THORACENTESIS ASP PLEURAL SPACE W/IMG GUIDE  07/26/2019  . MINIMALLY INVASIVE MAZE PROCEDURE N/A 06/12/2019   Procedure: MINIMALLY INVASIVE MAZE PROCEDURE;  Surgeon: Rexene Alberts, MD;  Location: [place];  Service: Open Heart Surgery;  Laterality: N/A;  . MINIMALLY INVASIVE TRICUSPID VALVE  REPAIR Right 06/12/2019   Procedure: MINIMALLY INVASIVE TRICUSPID VALVE REPAIR USING EDWARDS MC3 T28 ANNULOPLASTY RING, INSERTION TEMPORARY TRANSVENOUS AV PACING LEAD;  Surgeon: Rexene Alberts, MD;  Location: [place];  Service: Open Heart Surgery;  Laterality: Right;  . Moles removed    . MOUTH SURGERY     (For Exostosis)  . NUCLEAR  07/2018   EF 71 %. LOW RISK - no ischemia or Infarct.   Marland Kitchen RIGHT/LEFT HEART CATH AND CORONARY ANGIOGRAPHY N/A 01/16/2019   Procedure: RIGHT/LEFT HEART CATH AND CORONARY ANGIOGRAPHY;  Surgeon: Leonie Man, MD;  Location: [place] CV LAB;; Angiographically normal  coronary arteries.  Normal LVEDP. Upper Limit of Normal PAP~23 mmHg & PCWP 18 mmHg.  LVEDP of 7 mmHg. -With mean PAP of 23 mmHg there is a TPG suggesting a primary pulmonary etiology.  Marland Kitchen ROTATOR CUFF REPAIR    . TEE WITHOUT CARDIOVERSION N/A 01/16/2019   Procedure: TRANSESOPHAGEAL ECHOCARDIOGRAM (TEE);  Surgeon: Sueanne Margarita, MD;  Location: [facility] ENDOSCOPY;;  Severe TR due to poor coaptation of the leaflets from annular dilation.  Mild to moderate mitral vegetation with mildly restricted mobility of the posterior leaflet.  EF 55-60% with no R WMA.  Severe RA and LA dilation.  . TEE WITHOUT CARDIOVERSION N/A 06/12/2019   Procedure: TRANSESOPHAGEAL ECHOCARDIOGRAM (TEE);  Surgeon: Rexene Alberts, MD;  Location: [place];  Service: Open Heart Surgery;  Laterality: N/A;  . TONSILLECTOMY    . TRANSTHORACIC ECHOCARDIOGRAM  08/05/2019   First postop TAVR: EF 60 to 65%. Mod concentric LVH. Grade III DD (? w/ Mild LA dilation).  Rheumatic MV w/ mod thickening & Ca2+.  Anterior leaflet has mild doming but no MVP.  Mild-mod MR and mild to mod MS (mean MVA gradient 7 mmHg).  ~ functional bicuspid AoV w/ Mod Sclerosis - no AS.  Thoracic Aorta (4.1 & 3.7 @ Sinus of  V, prox Ascending) - mild dilation  . TRANSTHORACIC ECHOCARDIOGRAM  08/02/2018   Normal LV Fxn (EF 60-65%) no RWMA.  Severe LA dilation & Severe TR.    There were no vitals filed for this visit.   Subjective Assessment - 06/17/20 0847    Subjective I had to cancel last week because I had a bug bite on the Rt.    Pertinent History cardiac surgery 06/11/20    Currently in Pain? Yes    Pain Score 2     Pain Location Scapula    Pain Orientation Right    Pain Descriptors / Indicators Aching;Sharp    Pain Type Chronic pain    Pain Onset More than a month ago    Pain Frequency Intermittent    Aggravating Factors  lifting, lying on back, movement    Pain Relieving Factors pressing on the spot that is aggravated                              [facility] Adult PT Treatment/Exercise - 06/17/20 0001      Exercises   Exercises Shoulder      Shoulder Exercises: Supine   Horizontal ABduction Strengthening;Both;20 reps;Theraband    Theraband Level (Shoulder Horizontal ABduction) Level 2 (Red)    External Rotation Strengthening;20 reps;Theraband    Theraband Level (Shoulder External Rotation) Level 2 (Red)    Diagonals Strengthening;Both;20 reps;Theraband    Theraband Level (Shoulder Diagonals) Level 2 (Red)      Manual Therapy   Manual Therapy Soft tissue mobilization  Soft tissue mobilization STM mid and upper thoracic paraspinals, Rt middle trap/rhomboids, Rt infraspinatus, teres minor/major            Trigger Point Dry Needling - 06/17/20 0001    Education Handout Provided Previously provided    Muscles Treated Upper Quadrant Middle trapezius;Rhomboids;Longissimus   Rt only   Rhomboids Response Twitch response elicited;Palpable increased muscle length    Longissimus Response Twitch response elicited;Palpable increased muscle length   Rt   Middle trapezius Response Twitch response elicited;Palpable increased muscle length   Rt                 PT Short Term Goals - 06/17/20 0850      PT SHORT TERM GOAL #1   Title be independent in initial HEP    Status Achieved      PT SHORT TERM GOAL #2   Title report a 30% reduction in the frequency and intensity of Rt scapular pain with ADLs and self-care    Baseline 50%    Status Achieved             PT Long Term Goals - 05/13/20 1314      PT LONG TERM GOAL #1   Title be independent in advanced HEP    Time 8    Period Weeks    Status New    Target Date 07/08/20      PT LONG TERM GOAL #2   Title report a 60% reduction in the frequency and intensity of scapular pain with ADLs and self-care    Baseline --    Time 8    Period Weeks    Status New    Target Date 07/08/20      PT LONG TERM GOAL #3   Title lift over head  without limitation    Baseline --    Time 8    Period Weeks    Status New    Target Date 07/08/20      PT LONG TERM GOAL #4   Title sleep all night without waking due to thoracic pain    Time 8    Period Weeks    Status New    Target Date 07/08/20      PT LONG TERM GOAL #5   Title reduce FOTO to < or = to 33% limitation    Baseline ---    Time 8    Period Weeks    Status New    Target Date 07/08/20                 Plan - 06/17/20 0900    Clinical Impression Statement Pt reports 50% overall improvement since the start of care.  PT added additional exercises for postural strength and pt required minor cueing for scapular depression.  Pt with tension and trigger points in Rt scapular and thoracic region and demonstrated improved tissue mobility after manual therapy and dry needling today.  PT will continue to benefit from skilled PT to address thoracic pain, mobility and postural strength.    PT Frequency 1x / week    PT Duration 8 weeks    PT Treatment/Interventions ADLs/Self Care Home Management;Cryotherapy;Moist Heat;Traction;Electrical Stimulation;Functional mobility training;Neuromuscular re-education;Therapeutic exercise;Therapeutic activities;Patient/family education;Manual techniques;Passive range of motion;Dry needling;Taping    PT Next Visit Plan repeat dry needling to Rt thoracic if needed; review new HEP    PT Home Exercise Plan Access Code: [facility]    Recommended Other Services initial cert is signed  Consulted and Agree with Plan of Care Patient           Patient will benefit from skilled therapeutic intervention in order to improve the following deficits and impairments:  Decreased activity tolerance, Impaired flexibility, Impaired UE functional use, Improper body mechanics, Increased muscle spasms, Hypomobility  Visit Diagnosis: Pain in thoracic spine  Abnormal posture  Cramp and spasm  Acute right-sided low back pain without sciatica  Muscle  weakness (generalized)     Problem List Patient Active Problem List   Diagnosis Date Noted  . Palpitations 11/10/2019  . Pleural effusion, left   . Sinus node dysfunction (HCC)   . S/P minimally invasive tricuspid valve repair 06/12/2019  . S/P Maze operation for atrial fibrillation 06/12/2019  . 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 Bicuspid aortic valve 01/28/2019  . Tricuspid valve insufficiency   . Severe tricuspid regurgitation   . Long term (current) use of anticoagulants 08/28/2018  . Persistent atrial fibrillation ([place])   . DOE (dyspnea on exertion) 07/26/2018  . Osteopenia 03/13/2017  . Ocular migraine 03/13/2017  . OSA (obstructive sleep apnea) 12/21/2016  . IBS (irritable bowel syndrome) 09/12/2016  . History of adenomatous polyp of colon 09/12/2016  . Former smoker 09/12/2016  . Carpal tunnel syndrome 09/04/2013  . Lower extremity neuropathy 08/23/2013  . Achilles tendon tear 01/17/2011  . Hyperlipemia 01/07/2010  . GERD with stricture 06/19/2009  . ACNE ROSACEA 04/03/2008  . Essential tremor 11/16/2007  . Essential hypertension 11/16/2007    Sigurd Sos, PT 06/17/20 9:31 AM  [facility] [address] [place], [place], [address] Phone: [phone]   Fax:  [phone]  Name: SAI MOURA MRN: [id number] Date of Birth: 14-Dec-1935

## 2020-06-26 ENCOUNTER — Ambulatory Visit (INDEPENDENT_AMBULATORY_CARE_PROVIDER_SITE_OTHER): Payer: Medicare Other | Admitting: Pharmacist Clinician (PhC)/ Clinical Pharmacy Specialist

## 2020-06-26 ENCOUNTER — Other Ambulatory Visit: Payer: Self-pay

## 2020-06-26 DIAGNOSIS — I4819 Other persistent atrial fibrillation: Secondary | ICD-10-CM

## 2020-06-26 DIAGNOSIS — Z7901 Long term (current) use of anticoagulants: Secondary | ICD-10-CM

## 2020-06-26 LAB — POCT INR: INR: 2.7 (ref 2.0–3.0)

## 2020-07-01 ENCOUNTER — Other Ambulatory Visit: Payer: Self-pay

## 2020-07-01 ENCOUNTER — Ambulatory Visit: Payer: Medicare Other | Attending: Family Medicine

## 2020-07-01 DIAGNOSIS — R252 Cramp and spasm: Secondary | ICD-10-CM | POA: Insufficient documentation

## 2020-07-01 DIAGNOSIS — M545 Low back pain, unspecified: Secondary | ICD-10-CM

## 2020-07-01 DIAGNOSIS — R293 Abnormal posture: Secondary | ICD-10-CM | POA: Diagnosis not present

## 2020-07-01 DIAGNOSIS — M6281 Muscle weakness (generalized): Secondary | ICD-10-CM | POA: Diagnosis not present

## 2020-07-01 DIAGNOSIS — M546 Pain in thoracic spine: Secondary | ICD-10-CM | POA: Diagnosis not present

## 2020-07-01 NOTE — Therapy (Signed)
[facility] [address], [place], [place], [address] Phone: [phone]   Fax:  [phone]  Physical Therapy Treatment  Patient Details  Name: Adriana Hendricks MRN: [id number] Date of Birth: 04/11/1935 Referring Provider (PT): Garret Reddish, MD   Encounter Date: 07/01/2020   PT End of Session - 07/01/20 0911    Visit Number 5    PT Start Time 0845   short session. D/C session   PT Stop Time 0911    PT Time Calculation (min) 26 min    Activity Tolerance Patient tolerated treatment well;No increased pain    Behavior During Therapy WFL for tasks assessed/performed           Past Medical History:  Diagnosis Date   Achilles tendon rupture    Arthritis of hand, right    Atrial flutter with rapid ventricular response (HCC)    Carpal tunnel syndrome 09/04/2013   Collagenous colitis 2005   Colon polyps 2010   Tubular adenoma and hyperplastic   Dental infection 10/25/2014   Diverticulosis    Esophageal stenosis    Essential tremor    GERD (gastroesophageal reflux disease)    hx hiatal hernia, hx esophagitis, hx stricture   Hiatal hernia    Hypertension    Lower extremity neuropathy 08/23/2013   On B12 therapy with numbness in the feet bilaterally no evidence of diabetes    Ocular migraine    jagged vision, a few per month   OSA (obstructive sleep apnea) 12/21/2016   on CPAP   Peripheral neuropathy    treated by Dr Posey Pronto (07/2015)   Persistent atrial fibrillation ([place])    Rate control with beta-blocker and anticoagulated with warfarin   Pleural effusion, left    S/P Maze operation for atrial fibrillation 06/12/2019   Complete bilateral atrial lesion set using cryothermy and bipolar radiofrequency ablation with clipping of LA appendage via right mini thoracotomy approach   S/P minimally invasive tricuspid valve repair 06/12/2019   Complex valvuloplasty including autologous pericardial patch  augmentation of anterior and posterior leaflets with 28 mm Edwards mc3 ring annuloplasty via right mini thoracotomy approach   Severe tricuspid regurgitation 07/2018   Noted Aug 2019 during a fib workup. Severe LAE as well   Sinus node dysfunction (HCC)     Past Surgical History:  Procedure Laterality Date   48 hr Holter Monitor  07/2018   Persistent Afib (rate 33 - 124 bpm)   APPENDECTOMY     CARDIAC CATHETERIZATION     CARDIOVERSION N/A 10/02/2018   Procedure: CARDIOVERSION;  Surgeon: Acie Fredrickson Wonda Cheng, MD;  Location: [place];  Service: Cardiovascular;  Laterality: N/A;   CARPAL TUNNEL RELEASE     CATARACT EXTRACTION     EXCISION MORTON'S NEUROMA     Right foot   EYE SURGERY     Cataracts with implants-bilateral   INTRAOCULAR LENS IMPLANT, SECONDARY     IR THORACENTESIS ASP PLEURAL SPACE W/IMG GUIDE  07/10/2019   IR THORACENTESIS ASP PLEURAL SPACE W/IMG GUIDE  07/10/2019   IR THORACENTESIS ASP PLEURAL SPACE W/IMG GUIDE  07/26/2019   MINIMALLY INVASIVE MAZE PROCEDURE N/A 06/12/2019   Procedure: MINIMALLY INVASIVE MAZE PROCEDURE;  Surgeon: Rexene Alberts, MD;  Location: [place];  Service: Open Heart Surgery;  Laterality: N/A;   MINIMALLY INVASIVE TRICUSPID VALVE REPAIR Right 06/12/2019   Procedure: MINIMALLY INVASIVE TRICUSPID VALVE REPAIR USING EDWARDS MC3 T28 ANNULOPLASTY RING, INSERTION TEMPORARY TRANSVENOUS AV PACING LEAD;  Surgeon: Darylene Price  H, MD;  Location: [place];  Service: Open Heart Surgery;  Laterality: Right;   Moles removed     MOUTH SURGERY     (For Exostosis)   NUCLEAR  07/2018   EF 71 %. LOW RISK - no ischemia or Infarct.    RIGHT/LEFT HEART CATH AND CORONARY ANGIOGRAPHY N/A 01/16/2019   Procedure: RIGHT/LEFT HEART CATH AND CORONARY ANGIOGRAPHY;  Surgeon: Leonie Man, MD;  Location: [place] CV LAB;; Angiographically normal coronary arteries.  Normal LVEDP. Upper Limit of Normal PAP~23 mmHg & PCWP 18 mmHg.  LVEDP of 7 mmHg. -With mean  PAP of 23 mmHg there is a TPG suggesting a primary pulmonary etiology.   ROTATOR CUFF REPAIR     TEE WITHOUT CARDIOVERSION N/A 01/16/2019   Procedure: TRANSESOPHAGEAL ECHOCARDIOGRAM (TEE);  Surgeon: Sueanne Margarita, MD;  Location: [facility] ENDOSCOPY;;  Severe TR due to poor coaptation of the leaflets from annular dilation.  Mild to moderate mitral vegetation with mildly restricted mobility of the posterior leaflet.  EF 55-60% with no R WMA.  Severe RA and LA dilation.   TEE WITHOUT CARDIOVERSION N/A 06/12/2019   Procedure: TRANSESOPHAGEAL ECHOCARDIOGRAM (TEE);  Surgeon: Rexene Alberts, MD;  Location: [place];  Service: Open Heart Surgery;  Laterality: N/A;   TONSILLECTOMY     TRANSTHORACIC ECHOCARDIOGRAM  08/05/2019   First postop TAVR: EF 60 to 65%. Mod concentric LVH. Grade III DD (? w/ Mild LA dilation).  Rheumatic MV w/ mod thickening & Ca2+.  Anterior leaflet has mild doming but no MVP.  Mild-mod MR and mild to mod MS (mean MVA gradient 7 mmHg).  ~ functional bicuspid AoV w/ Mod Sclerosis - no AS.  Thoracic Aorta (4.1 & 3.7 @ Sinus of  V, prox Ascending) - mild dilation   TRANSTHORACIC ECHOCARDIOGRAM  08/02/2018   Normal LV Fxn (EF 60-65%) no RWMA.  Severe LA dilation & Severe TR.    There were no vitals filed for this visit.   Subjective Assessment - 07/01/20 0848    Subjective I still had that rash so I couldn't come here. 98% overall improvement.    Currently in Pain? No/denies              [facility] PT Assessment - 07/01/20 0001      Assessment   Medical Diagnosis Rt sided chest wall pain    Referring Provider (PT) Garret Reddish, MD      Cognition   Overall Cognitive Status Within Functional Limits for tasks assessed      Observation/Other Assessments   Focus on Therapeutic Outcomes (FOTO)  0% limitation      Posture/Postural Control   Posture/Postural Control Postural limitations      Palpation   Spinal mobility improved spinal mobility                           [facility] Adult PT Treatment/Exercise - 07/01/20 0001      Shoulder Exercises: Supine   Horizontal ABduction Strengthening;Both;20 reps;Theraband    Theraband Level (Shoulder Horizontal ABduction) Level 2 (Red)    External Rotation Strengthening;20 reps;Theraband    Theraband Level (Shoulder External Rotation) Level 2 (Red)    Diagonals Strengthening;Both;20 reps;Theraband    Theraband Level (Shoulder Diagonals) Level 2 (Red)      Shoulder Exercises: Sidelying   Other Sidelying Exercises open book x 5 each  PT Short Term Goals - 06/17/20 0850      PT SHORT TERM GOAL #1   Title be independent in initial HEP    Status Achieved      PT SHORT TERM GOAL #2   Title report a 30% reduction in the frequency and intensity of Rt scapular pain with ADLs and self-care    Baseline 50%    Status Achieved             PT Long Term Goals - 07/01/20 0849      PT LONG TERM GOAL #1   Title be independent in advanced HEP    Status Achieved      PT LONG TERM GOAL #2   Title report a 60% reduction in the frequency and intensity of scapular pain with ADLs and self-care    Baseline 98% improvement    Status Achieved      PT LONG TERM GOAL #3   Title lift over head without limitation    Status Achieved      PT LONG TERM GOAL #4   Title sleep all night without waking due to thoracic pain    Status Achieved                 Plan - 07/01/20 0855    Clinical Impression Statement Pt with lapse in treatment since 06/17/20.  Pt reports 98% overall improvement in symptoms and will D/C to HEP today.  Pt has met all goals.  Pt denies any pain, limitataions or sleep disruption due to pain.  Session spent with goal assessment and review of HEP.  Pt is performing all correctly. Pt will follow-up with MD as needed.    PT Next Visit Plan D/C PT to HEP today    PT Home Exercise Plan Access Code: [email or web address] and Agree with Plan of  Care Patient           Patient will benefit from skilled therapeutic intervention in order to improve the following deficits and impairments:     Visit Diagnosis: Pain in thoracic spine  Abnormal posture  Cramp and spasm  Acute right-sided low back pain without sciatica  Muscle weakness (generalized)     Problem List Patient Active Problem List   Diagnosis Date Noted   Palpitations 11/10/2019   Pleural effusion, left    Sinus node dysfunction (HCC)    S/P minimally invasive tricuspid valve repair 06/12/2019   S/P Maze operation for atrial fibrillation 06/12/2019   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   Bicuspid aortic valve 01/28/2019   Tricuspid valve insufficiency    Severe tricuspid regurgitation    Long term (current) use of anticoagulants 08/28/2018   Persistent atrial fibrillation (HCC)    DOE (dyspnea on exertion) 07/26/2018   Osteopenia 03/13/2017   Ocular migraine 03/13/2017   OSA (obstructive sleep apnea) 12/21/2016   IBS (irritable bowel syndrome) 09/12/2016   History of adenomatous polyp of colon 09/12/2016   Former smoker 09/12/2016   Carpal tunnel syndrome 09/04/2013   Lower extremity neuropathy 08/23/2013   Achilles tendon tear 01/17/2011   Hyperlipemia 01/07/2010   GERD with stricture 06/19/2009   ACNE ROSACEA 04/03/2008   Essential tremor 11/16/2007   Essential hypertension 11/16/2007   PHYSICAL THERAPY DISCHARGE SUMMARY  Visits from Start of Care: 5  Current functional level related to goals / functional outcomes: See above for current status.  98% overall improvement since the start of care and no pain reported.  Remaining deficits: No deficits remain at this time.     Education / Equipment: HEP, posture Plan: Patient agrees to discharge.  Patient goals were met. Patient is being discharged due to meeting the stated rehab goals.  ?????         Sigurd Sos, PT 07/01/20 9:13 AM  [facility] [address] [place], [place], [address] Phone: [phone]   Fax:  [phone]  Name: Adriana Hendricks MRN: [id number] Date of Birth: 06/08/1935

## 2020-07-06 ENCOUNTER — Other Ambulatory Visit: Payer: Self-pay

## 2020-07-06 ENCOUNTER — Ambulatory Visit (INDEPENDENT_AMBULATORY_CARE_PROVIDER_SITE_OTHER): Payer: Medicare Other | Admitting: Thoracic Surgery (Cardiothoracic Vascular Surgery)

## 2020-07-06 ENCOUNTER — Encounter: Payer: Self-pay | Admitting: Thoracic Surgery (Cardiothoracic Vascular Surgery)

## 2020-07-06 VITALS — BP 149/87 | HR 86 | Resp 20 | Ht 65.0 in | Wt 169.0 lb

## 2020-07-06 DIAGNOSIS — Z8679 Personal history of other diseases of the circulatory system: Secondary | ICD-10-CM | POA: Diagnosis not present

## 2020-07-06 DIAGNOSIS — Z9889 Other specified postprocedural states: Secondary | ICD-10-CM

## 2020-07-06 NOTE — Patient Instructions (Signed)

## 2020-07-06 NOTE — Progress Notes (Signed)
[address]       [place] [address]             [phone]     CARDIOTHORACIC SURGERY OFFICE NOTE  Primary Cardiologist is Glenetta Hew, MD PCP is Marin Olp, MD   HPI:  Patient is an 85 year old female with history of hypertension and obstructive sleep apnea on home CPAP whounderwent minimally invasive tricuspid valve repairandMaze procedure on June 12, 2019 for rheumatic heart disease with severe symptomatic tricuspid regurgitation and recurrent persistent atrial fibrillation.    Follow-up echocardiogram performed August 05, 2019 revealed normal left and right ventricular systolic function with intact tricuspid valve repair and what was felt to be mild to moderate residual tricuspid regurgitation.  The patient also has rheumatic mitral valve disease with mild to moderate mitral regurgitation and mild mitral stenosis.  She was last seen here in our office August 26, 2019 and since then we had a telemedicine visit on December 30, 2019.  She was seen in follow-up by Dr. [name] on May 14, 2020 at which time she remained in sinus rhythm.  Patient returns her office today and reports that overall she is doing "okay".  Overall she states that her breathing is "somewhat improved" in comparison with how she felt prior to surgery.  However, she complains that with more strenuous exertion she just does not seem to have the energy that she used to.  She states that when she was in atrial fibrillation she could "power through" when she was trying to do something more strenuous.  She has not had problems with lower extremity edema or volume retention.  She denies resting shortness of breath, PND, orthopnea.  She has never had any chest pain of any significance.  She has not had a follow-up echocardiogram performed since the one she had performed August 2020.   Current Outpatient Medications  Medication Sig Dispense Refill  . aspirin EC 81 MG EC tablet Take 1 tablet (81 mg  total) by mouth daily.    . B Complex Vitamins (B COMPLEX 100 PO) Take 1 tablet by mouth daily.     . calcium carbonate (TUMS - DOSED IN MG ELEMENTAL CALCIUM) 500 MG chewable tablet Chew 1 tablet by mouth daily.     . cholecalciferol (VITAMIN D3) 25 MCG (1000 UT) tablet Take 1,000 Units by mouth daily.    . famotidine (PEPCID) 20 MG tablet Take 20 mg by mouth at bedtime.    . ferrous sulfate 325 (65 FE) MG EC tablet Take 325 mg by mouth daily with breakfast.    . furosemide (LASIX) 40 MG tablet Take 0.5 tablets (20 mg total) by mouth daily. 45 tablet 2  . metoprolol tartrate (LOPRESSOR) 25 MG tablet Take 25 mg by mouth as needed.    . Multiple Vitamins-Minerals (PRESERVISION AREDS 2 PO) Take 2 capsules by mouth daily.     . nadolol (CORGARD) 40 MG tablet Take 1 tablet (40 mg total) by mouth daily. 90 tablet 3  . potassium chloride 20 MEQ/15ML (10%) SOLN Take 15 mLs (20 mEq total) by mouth daily. TAKE 15 ML BY MOUTH  ONCE DAILY FOR  26  DOSES 390 mL 12  . warfarin (COUMADIN) 2.5 MG tablet TAKE 1 TO 1 & 1/2 (ONE & ONE-HALF) TABLETS BY MOUTH ONCE DAILY OR  AS  INSTRUCTED  BY  COUMADIN  CLINIC 120 tablet 0   No current facility-administered medications for this visit.  Physical Exam:   BP (!) 149/87   Pulse 86   Resp 20   Ht 5\' 5"  (1.651 m)   Wt 169 lb (76.7 kg)   LMP  (LMP Unknown)   SpO2 95% Comment: RA  BMI 28.12 kg/m   General:  Well-appearing  Chest:   Clear to auscultation  CV:   Regular rate and rhythm without murmur  Incisions:  Completely healed  Abdomen:  Soft nontender  Extremities:  Warm and well-perfused, no edema  Diagnostic Tests:  2 channel telemetry rhythm strip demonstrates normal sinus rhythm   Impression:  Patient is clinically doing well and maintaining sinus rhythm more than 1 year status post tricuspid valve repair and Maze procedure.  She remains chronically anticoagulated using warfarin.  She complains of not having as much energy as she used to.   She still has some dyspnea with exertion, although she states that her breathing is better than it was prior to surgery.  Plan:  We have not made any specific recommendations regarding medical therapy, although it might be reasonable to consider cutting back on her dose of beta-blocker to see if she feels as though she has more energy.  She has not had a recurrence of atrial fibrillation.  Risk factors regarding long-term risks of stroke include age of nearly 30 years and history of hypertension.  She has underlying rheumatic mitral and tricuspid valve disease.  Follow-up echocardiogram in the not too distant future might be reasonable.  Patient will continue to follow-up with Dr. [name] and call to return to our office in the future only should specific problems or questions arise.   I spent in excess of 15 minutes during the conduct of this office consultation and >50% of this time involved direct face-to-face encounter with the patient for counseling and/or coordination of their care.    Valentina Gu. Roxy Manns, MD 07/06/2020 12:16 PM

## 2020-07-07 ENCOUNTER — Other Ambulatory Visit: Payer: Self-pay | Admitting: Cardiology

## 2020-07-13 ENCOUNTER — Encounter: Payer: Self-pay | Admitting: Adult Health

## 2020-07-13 ENCOUNTER — Ambulatory Visit (INDEPENDENT_AMBULATORY_CARE_PROVIDER_SITE_OTHER): Payer: Medicare Other | Admitting: Adult Health

## 2020-07-13 DIAGNOSIS — G4733 Obstructive sleep apnea (adult) (pediatric): Secondary | ICD-10-CM

## 2020-07-13 NOTE — Patient Instructions (Addendum)
Continue on CPAP .  Keep up the good work Do not drive if sleepy .  Trial of Dream wear nasal mask.  Follow up with Dr. Halford Chessman  In 1 year and As needed

## 2020-07-13 NOTE — Assessment & Plan Note (Addendum)
Excellent control and compliance on nocturnal CPAP  Plan  Patient Instructions  Continue on CPAP .  Keep up the good work Do not drive if sleepy .  Trial of Dream wear nasal mask.  Follow up with Dr. Halford Chessman  In 1 year and As needed

## 2020-07-13 NOTE — Progress Notes (Signed)
@Patient  ID: Adriana Hendricks, female    DOB: 1935/12/06, 85 y.o.   MRN: [id number]  Chief Complaint  Patient presents with  . Follow-up    OSA    Referring provider: Marin Olp, MD  HPI: 85 year old female followed for obstructive sleep apnea Medical history significant for tricuspid valve repair June 2020, A. fib status post Maze procedure, neuropathy, hypertension, essential tremor, collagenous colitis  TEST/EVENTS :  HST 12/14/16 >> AHI 19.5, SaO2 low 78% Auto CPAP 06/24/19 to 07/23/19 >> used on 30 of 30 nights with average 7 hrs 12 min.  Average AHI 0.1 with median CPAP 6 and 95 th percentile CPAP 7 cm H2O  07/13/2020 Follow up : OSA  Patient returns for a 1 year follow-up.  Patient has underlying moderate obstructive sleep apnea.  She is on nocturnal CPAP.  Patient states she is doing very well on CPAP.  She wears it every single night never misses any nights.  She says she feels rested with no significant daytime sleepiness and feels that she benefits from CPAP.  CPAP download shows excellent compliance over the last 30 days.  Daily average usage at 8 hours.  Patient is on auto CPAP 5 to 8 cm H2O.  AHI 0.3. + leaks.  Daily average pressure at 8 cm H2O.  Allergies  Allergen Reactions  . Levofloxacin Other (See Comments)    Developed tendon pain. (Has a history of a Achilles tendon tear)  . Potassium Chloride Hives  . Clarithromycin Nausea Only  . Erythromycin Base Nausea And Vomiting  . Metronidazole Rash  . Penicillins Rash and Other (See Comments)    Did it involve swelling of the face/tongue/throat, SOB, or low BP? No Did it involve sudden or severe rash/hives, skin peeling, or any reaction on the inside of your mouth or nose? No Did you need to seek medical attention at a hospital or doctor's office? No When did it last happen? as a child If all above answers are "NO", may proceed with cephalosporin use.     Immunization History  Administered Date(s)  Administered  . Fluad Quad(high Dose 65+) 10/08/2019  . Influenza Split 12/01/2011, 11/05/2012  . Influenza Whole 01/07/2010, 10/08/2010  . Influenza, High Dose Seasonal PF 10/01/2014, 11/10/2015, 10/25/2016, 10/30/2017, 10/22/2018  . Influenza,inj,Quad PF,6+ Mos 08/23/2013  . PFIZER SARS-COV-2 Vaccination 01/04/2020, 01/25/2020  . Pneumococcal Conjugate-13 04/13/2015  . Pneumococcal Polysaccharide-23 12/26/2005  . Td 12/26/2001  . Tdap 01/23/2012  . Zoster 02/08/2010    Past Medical History:  Diagnosis Date  . Achilles tendon rupture   . Arthritis of hand, right   . Atrial flutter with rapid ventricular response ([place])   . Carpal tunnel syndrome 09/04/2013  . Collagenous colitis 2005  . Colon polyps 2010   Tubular adenoma and hyperplastic  . Dental infection 10/25/2014  . Diverticulosis   . Esophageal stenosis   . Essential tremor   . GERD (gastroesophageal reflux disease)    hx hiatal hernia, hx esophagitis, hx stricture  . Hiatal hernia   . Hypertension   . Lower extremity neuropathy 08/23/2013   On B12 therapy with numbness in the feet bilaterally no evidence of diabetes   . Ocular migraine    jagged vision, a few per month  . OSA (obstructive sleep apnea) 12/21/2016   on CPAP  . Peripheral neuropathy    treated by Dr Posey Pronto (07/2015)  . Persistent atrial fibrillation (HCC)    Rate control with beta-blocker and anticoagulated with warfarin  .  Pleural effusion, left   . S/P Maze operation for atrial fibrillation 06/12/2019   Complete bilateral atrial lesion set using cryothermy and bipolar radiofrequency ablation with clipping of LA appendage via right mini thoracotomy approach  . S/P minimally invasive tricuspid valve repair 06/12/2019   Complex valvuloplasty including autologous pericardial patch augmentation of anterior and posterior leaflets with 28 mm Edwards mc3 ring annuloplasty via right mini thoracotomy approach  . Severe tricuspid regurgitation 07/2018   Noted Aug  2019 during a fib workup. Severe LAE as well  . Sinus node dysfunction (HCC)     Tobacco History: Social History   Tobacco Use  Smoking Status Former Smoker  . Packs/day: 2.00  . Years: 13.00  . Pack years: 26.00  . Types: Cigarettes  . Start date: 12/26/1954  . Quit date: 03/26/1968  . Years since quitting: 52.3  Smokeless Tobacco Never Used  Tobacco Comment   Quit in 1969 - smokeed 2-3PPD , was 3 PPD at her heaviest for about 3 years.    Counseling given: Not Answered Comment: Quit in 1969 - smokeed 2-3PPD , was 3 PPD at her heaviest for about 3 years.    Outpatient Medications Prior to Visit  Medication Sig Dispense Refill  . aspirin EC 81 MG EC tablet Take 1 tablet (81 mg total) by mouth daily.    . B Complex Vitamins (B COMPLEX 100 PO) Take 1 tablet by mouth daily.     . calcium carbonate (TUMS - DOSED IN MG ELEMENTAL CALCIUM) 500 MG chewable tablet Chew 1 tablet by mouth daily.     . cholecalciferol (VITAMIN D3) 25 MCG (1000 UT) tablet Take 1,000 Units by mouth daily.    . famotidine (PEPCID) 20 MG tablet Take 20 mg by mouth at bedtime.    . ferrous sulfate 325 (65 FE) MG EC tablet Take 325 mg by mouth daily with breakfast.    . furosemide (LASIX) 40 MG tablet Take 1/2 (one-half) tablet by mouth once daily 90 tablet 3  . metoprolol tartrate (LOPRESSOR) 25 MG tablet Take 25 mg by mouth as needed.    . Multiple Vitamins-Minerals (PRESERVISION AREDS 2 PO) Take 2 capsules by mouth daily.     . nadolol (CORGARD) 40 MG tablet Take 1 tablet (40 mg total) by mouth daily. 90 tablet 3  . potassium chloride 20 MEQ/15ML (10%) SOLN Take 15 mLs (20 mEq total) by mouth daily. TAKE 15 ML BY MOUTH  ONCE DAILY FOR  26  DOSES 390 mL 12  . warfarin (COUMADIN) 2.5 MG tablet TAKE 1 TO 1 & 1/2 (ONE & ONE-HALF) TABLETS BY MOUTH ONCE DAILY OR  AS  INSTRUCTED  BY  COUMADIN  CLINIC 120 tablet 0   No facility-administered medications prior to visit.     Review of Systems:   Constitutional:   No   weight loss, night sweats,  Fevers, chills, + fatigue, or  lassitude.  HEENT:   No headaches,  Difficulty swallowing,  Tooth/dental problems, or  Sore throat,                No sneezing, itching, ear ache, nasal congestion, post nasal drip,   CV:  No chest pain,  Orthopnea, PND, swelling in lower extremities, anasarca, dizziness, palpitations, syncope.   GI  No heartburn, indigestion, abdominal pain, nausea, vomiting, diarrhea, change in bowel habits, loss of appetite, bloody stools.   Resp: No shortness of breath with exertion or at rest.  No excess mucus, no productive cough,  No non-productive cough,  No coughing up of blood.  No change in color of mucus.  No wheezing.  No chest wall deformity  Skin: no rash or lesions.  GU: no dysuria, change in color of urine, no urgency or frequency.  No flank pain, no hematuria   MS:  No joint pain or swelling.  No decreased range of motion.  No back pain.    Physical Exam  BP 140/70 (Cuff Size: Normal)   Pulse 85   Temp 98.1 F (36.7 C) (Oral)   Ht 5\' 5"  (1.651 m)   Wt 169 lb 6.4 oz (76.8 kg)   LMP  (LMP Unknown)   SpO2 93%   BMI 28.19 kg/m   GEN: A/Ox3; pleasant , NAD, well nourished    HEENT:  [place]/AT,   , NOSE-clear, THROAT-clear, no lesions, no postnasal drip or exudate noted. Class 2-3 Mp airway   NECK:  Supple w/ fair ROM; no JVD; normal carotid impulses w/o bruits; no thyromegaly or nodules palpated; no lymphadenopathy.    RESP  Clear  P & A; w/o, wheezes/ rales/ or rhonchi. no accessory muscle use, no dullness to percussion  CARD:  RRR, no m/r/g, no peripheral edema, pulses intact, no cyanosis or clubbing.  GI:   Soft & nt; nml bowel sounds; no organomegaly or masses detected.   Musco: Warm bil, no deformities or joint swelling noted.   Neuro: alert, no focal deficits noted.    Skin: Warm, no lesions or rashes    Lab Results:  CBC  BMET  BNP No results found for: BNP  ProBNP No results found for:  PROBNP  Imaging: No results found.    No flowsheet data found.  No results found for: NITRICOXIDE      Assessment & Plan:   OSA (obstructive sleep apnea) Excellent control and compliance on nocturnal CPAP  Plan  Patient Instructions  Continue on CPAP .  Keep up the good work Do not drive if sleepy .  Trial of Dream wear nasal mask.  Follow up with Dr. Halford Chessman  In 1 year and As needed          Rexene Edison, NP 07/13/2020

## 2020-07-14 NOTE — Progress Notes (Signed)
Reviewed and agree with assessment/plan.   Chesley Mires, MD [facility] Pulmonary/Critical Care 07/14/2020, 8:25 AM Pager:  [phone]

## 2020-07-22 ENCOUNTER — Telehealth: Payer: Self-pay | Admitting: Family Medicine

## 2020-07-22 NOTE — Progress Notes (Signed)
°  Chronic Care Management   Note  07/22/2020 Name: TOY SAMARIN MRN: [id number] DOB: [birth year]  NICO ROGNESS is a 85 y.o. year old female who is a primary care patient of Marin Olp, MD. I reached out to [name] by phone today in response to a referral sent by Ms. Bea Graff Gibeau's PCP, Marin Olp, MD.   Ms. Zenk was given information about Chronic Care Management services today including:  1. CCM service includes personalized support from designated clinical staff supervised by her physician, including individualized plan of care and coordination with other care providers 2. 24/7 contact phone numbers for assistance for urgent and routine care needs. 3. Service will only be billed when office clinical staff spend 20 minutes or more in a month to coordinate care. 4. Only one practitioner may furnish and bill the service in a calendar month. 5. The patient may stop CCM services at any time (effective at the end of the month) by phone call to the office staff.   Patient agreed to services and verbal consent obtained.   Follow up plan:   Earney [place] [employer] Scheduler

## 2020-07-26 ENCOUNTER — Other Ambulatory Visit: Payer: Self-pay | Admitting: Cardiology

## 2020-07-30 DIAGNOSIS — Z20822 Contact with and (suspected) exposure to covid-19: Secondary | ICD-10-CM | POA: Diagnosis not present

## 2020-08-07 ENCOUNTER — Other Ambulatory Visit: Payer: Self-pay

## 2020-08-07 ENCOUNTER — Ambulatory Visit (INDEPENDENT_AMBULATORY_CARE_PROVIDER_SITE_OTHER): Payer: Medicare Other

## 2020-08-07 DIAGNOSIS — Z7901 Long term (current) use of anticoagulants: Secondary | ICD-10-CM | POA: Diagnosis not present

## 2020-08-07 DIAGNOSIS — I4819 Other persistent atrial fibrillation: Secondary | ICD-10-CM

## 2020-08-07 LAB — POCT INR: INR: 2.7 (ref 2.0–3.0)

## 2020-08-07 MED ORDER — NADOLOL 40 MG PO TABS
20.0000 mg | ORAL_TABLET | Freq: Every day | ORAL | 3 refills | Status: DC
Start: 2020-08-07 — End: 2021-06-18

## 2020-08-07 NOTE — Patient Instructions (Signed)
Continue with 1 tablet daily except 2 tablets each Monday, Wednesday and Friday.  Repeat INR in 6 weeks 

## 2020-08-25 ENCOUNTER — Other Ambulatory Visit: Payer: Self-pay

## 2020-08-25 DIAGNOSIS — E785 Hyperlipidemia, unspecified: Secondary | ICD-10-CM

## 2020-08-25 DIAGNOSIS — I10 Essential (primary) hypertension: Secondary | ICD-10-CM

## 2020-08-25 DIAGNOSIS — Z8639 Personal history of other endocrine, nutritional and metabolic disease: Secondary | ICD-10-CM

## 2020-08-28 NOTE — Progress Notes (Signed)
Chronic Care Management Pharmacy  Name: Adriana Hendricks  MRN: [id number] DOB: 27-Feb-1935  Chief Complaint/ HPI  Adriana Hendricks,  85 y.o., female presents for their Initial CCM visit with the clinical pharmacist via telephone due to COVID-19 Pandemic.  PCP : Adriana Olp, MD  Chronic conditions include:  Encounter Diagnoses  Name Primary?  . Hyperlipidemia, unspecified hyperlipidemia type Yes  . Essential hypertension   . Persistent atrial fibrillation (Adriana Hendricks)     Office Visits:  04/02/2020 (PCP): poor control of BP, was to f/u in 2 weeks with Bps from home. nadalol for essential tremor   Consult Visit: 07/13/2020 (Adriana Hendricks, Pulmonology): OSA f/u; excellent control and compliance on nocturnal cpap. 1 year f/u with Dr. Halford Hendricks.  Patient Active Problem List   Diagnosis Date Noted  . Palpitations 11/10/2019  . Pleural effusion, left   . Sinus node dysfunction (HCC)   . S/P minimally invasive tricuspid valve repair 06/12/2019  . S/P Maze operation for atrial fibrillation 06/12/2019  . 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 Bicuspid aortic valve 01/28/2019  . Tricuspid valve insufficiency   . Severe tricuspid regurgitation   . Long term (current) use of anticoagulants 08/28/2018  . Persistent atrial fibrillation ([place])   . DOE (dyspnea on exertion) 07/26/2018  . Osteopenia 03/13/2017  . Ocular migraine 03/13/2017  . OSA (obstructive sleep apnea) 12/21/2016  . IBS (irritable bowel syndrome) 09/12/2016  . History of adenomatous polyp of colon 09/12/2016  . Former smoker 09/12/2016  . Carpal tunnel syndrome 09/04/2013  . Lower extremity neuropathy 08/23/2013  . Achilles tendon tear 01/17/2011  . Hyperlipemia 01/07/2010  . GERD with stricture 06/19/2009  . ACNE ROSACEA 04/03/2008  . Essential tremor 11/16/2007  . Essential hypertension 11/16/2007   Past Surgical History:  Procedure Laterality Date  . 48 hr Holter Monitor  07/2018   Persistent Afib (rate 33 - 124  bpm)  . APPENDECTOMY    . CARDIAC CATHETERIZATION    . CARDIOVERSION N/A 10/02/2018   Procedure: CARDIOVERSION;  Surgeon: Acie Fredrickson, Wonda Cheng, MD;  Location: [facility] ENDOSCOPY;  Service: Cardiovascular;  Laterality: N/A;  . CARPAL TUNNEL RELEASE    . CATARACT EXTRACTION    . EXCISION MORTON'S NEUROMA     Right foot  . EYE SURGERY     Cataracts with implants-bilateral  . INTRAOCULAR LENS IMPLANT, SECONDARY    . IR THORACENTESIS ASP PLEURAL SPACE W/IMG GUIDE  07/10/2019  . IR THORACENTESIS ASP PLEURAL SPACE W/IMG GUIDE  07/10/2019  . IR THORACENTESIS ASP PLEURAL SPACE W/IMG GUIDE  07/26/2019  . MINIMALLY INVASIVE MAZE PROCEDURE N/A 06/12/2019   Procedure: MINIMALLY INVASIVE MAZE PROCEDURE;  Surgeon: Rexene Alberts, MD;  Location: [place];  Service: Open Heart Surgery;  Laterality: N/A;  . MINIMALLY INVASIVE TRICUSPID VALVE REPAIR Right 06/12/2019   Procedure: MINIMALLY INVASIVE TRICUSPID VALVE REPAIR USING EDWARDS MC3 T28 ANNULOPLASTY RING, INSERTION TEMPORARY TRANSVENOUS AV PACING LEAD;  Surgeon: Rexene Alberts, MD;  Location: [place];  Service: Open Heart Surgery;  Laterality: Right;  . Moles removed    . MOUTH SURGERY     (For Exostosis)  . NUCLEAR  07/2018   EF 71 %. LOW RISK - no ischemia or Infarct.   Marland Kitchen RIGHT/LEFT HEART CATH AND CORONARY ANGIOGRAPHY N/A 01/16/2019   Procedure: RIGHT/LEFT HEART CATH AND CORONARY ANGIOGRAPHY;  Surgeon: Leonie Man, MD;  Location: [place] CV LAB;; Angiographically normal coronary arteries.  Normal LVEDP. Upper Limit of Normal PAP~23 mmHg & PCWP 18  mmHg.  LVEDP of 7 mmHg. -With mean PAP of 23 mmHg there is a TPG suggesting a primary pulmonary etiology.  Marland Kitchen ROTATOR CUFF REPAIR    . TEE WITHOUT CARDIOVERSION N/A 01/16/2019   Procedure: TRANSESOPHAGEAL ECHOCARDIOGRAM (TEE);  Surgeon: Sueanne Margarita, MD;  Location: [facility] ENDOSCOPY;;  Severe TR due to poor coaptation of the leaflets from annular dilation.  Mild to moderate mitral vegetation with mildly restricted  mobility of the posterior leaflet.  EF 55-60% with no R WMA.  Severe RA and LA dilation.  . TEE WITHOUT CARDIOVERSION N/A 06/12/2019   Procedure: TRANSESOPHAGEAL ECHOCARDIOGRAM (TEE);  Surgeon: Rexene Alberts, MD;  Location: [place];  Service: Open Heart Surgery;  Laterality: N/A;  . TONSILLECTOMY    . TRANSTHORACIC ECHOCARDIOGRAM  08/05/2019   First postop TAVR: EF 60 to 65%. Mod concentric LVH. Grade III DD (? w/ Mild LA dilation).  Rheumatic MV w/ mod thickening & Ca2+.  Anterior leaflet has mild doming but no MVP.  Mild-mod MR and mild to mod MS (mean MVA gradient 7 mmHg).  ~ functional bicuspid AoV w/ Mod Sclerosis - no AS.  Thoracic Aorta (4.1 & 3.7 @ Sinus of  V, prox Ascending) - mild dilation  . TRANSTHORACIC ECHOCARDIOGRAM  08/02/2018   Normal LV Fxn (EF 60-65%) no RWMA.  Severe LA dilation & Severe TR.   Family History  Problem Relation Age of Onset  . Rectal cancer Father        colostomy  . Heart failure Father        Died, 89  . Other Mother        Died, 81. old age.   . Barrett's esophagus Son   . Colon cancer Neg Hx   . Esophageal cancer Neg Hx   . Stomach cancer Neg Hx    Allergies  Allergen Reactions  . Levofloxacin Other (See Comments)    Developed tendon pain. (Has a history of a Achilles tendon tear)  . Potassium Chloride Hives  . Clarithromycin Nausea Only  . Erythromycin Base Nausea And Vomiting  . Metronidazole Rash  . Penicillins Rash and Other (See Comments)    Did it involve swelling of the face/tongue/throat, SOB, or low BP? No Did it involve sudden or severe rash/hives, skin peeling, or any reaction on the inside of your mouth or nose? No Did you need to seek medical attention at a hospital or doctor's office? No When did it last happen? as a child If all above answers are "NO", may proceed with cephalosporin use.    Outpatient Encounter Medications as of 09/02/2020  Medication Sig  . aspirin EC 81 MG EC tablet Take 1 tablet (81 mg total) by mouth  daily.  . B Complex Vitamins (B COMPLEX 100 PO) Take 1 tablet by mouth daily.   . calcium carbonate (TUMS - DOSED IN MG ELEMENTAL CALCIUM) 500 MG chewable tablet Chew 1 tablet by mouth daily.   . cholecalciferol (VITAMIN D3) 25 MCG (1000 UT) tablet Take 1,000 Units by mouth daily.  . famotidine (PEPCID) 20 MG tablet Take 20 mg by mouth at bedtime.  . ferrous sulfate 325 (65 FE) MG EC tablet Take 325 mg by mouth daily with breakfast.  . furosemide (LASIX) 40 MG tablet Take 1/2 (one-half) tablet by mouth once daily  . metoprolol tartrate (LOPRESSOR) 25 MG tablet Take 25 mg by mouth as needed.  . Multiple Vitamins-Minerals (PRESERVISION AREDS 2 PO) Take 1 capsule by mouth in the morning and  at bedtime.   . nadolol (CORGARD) 40 MG tablet Take 0.5 tablets (20 mg total) by mouth daily.  . potassium chloride 20 MEQ/15ML (10%) SOLN Take 15 mLs (20 mEq total) by mouth daily. TAKE 15 ML BY MOUTH  ONCE DAILY FOR  26  DOSES  . TURMERIC PO Take 500 mg by mouth 3 (three) times a week.  . warfarin (COUMADIN) 2.5 MG tablet TAKE 1 TO 1 & 1/2 (ONE & ONE-HALF) TABLETS BY MOUTH ONCE DAILY OR  AS  INSTRUCTED  BY  COUMADIN  CLINIC   No facility-administered encounter medications on file as of 09/02/2020.   Patient Care Team    Relationship Specialty Notifications Start End  Adriana Olp, MD PCP - General Family Medicine  03/09/17   Leonie Man, MD PCP - Cardiology Cardiology Admissions 08/10/18   Alda Berthold, DO Consulting Physician Neurology  08/05/15   Lafayette Dragon, MD (Inactive) Consulting Physician Gastroenterology  08/05/15   Crista Luria, MD Consulting Physician Dermatology  08/05/15   Shon Hough, MD Consulting Physician Ophthalmology  08/05/15   Ladene Artist, MD Consulting Physician Gastroenterology  03/09/17   Alda Berthold, DO Consulting Physician Neurology  03/09/17   Daryll Brod, MD Consulting Physician Orthopedic Surgery  03/09/17   Devra Dopp, MD Referring Physician  Dermatology  03/09/17   Chesley Mires, MD Consulting Physician Pulmonary Disease  03/09/17   [name], [facility] Pharmacist Pharmacist  07/22/20    Comment: [phone]   Current Diagnosis/Assessment: Goals Addressed            This Visit's Progress   . PharmD Care Plan       CARE PLAN ENTRY (see longitudinal plan of care for additional care plan information)  Current Barriers:  . Chronic Disease Management support, education, and care coordination needs related to Hypertension, Hyperlipidemia, and Atrial Fibrillation   Hypertension / Afib BP Readings from Last 3 Encounters:  07/13/20 140/70  07/06/20 (!) 149/87  05/14/20 (!) 158/76   . Pulse Readings from Last 3 Encounters: .  07/13/20 . 85 .  07/06/20 . 86 .  05/14/20 . 72   . Pharmacist Clinical Goal(s): o Over the next 180 days, patient will work with PharmD and providers to maintain BP goal <140/90 . Current regimen:  o Anticoagulation - Warfarin 2.5 mg as directed by coumadin clinic  o Blood pressure / heart rate - Nadalol 20 mg once daily - Lopressor 25 mg once daily as needed . Interventions: o Continue current management . Patient self care activities - Over the next 180 days, patient will: o Check BP/HR at least once every 1-2 weeks, document, and provide at future appointments o Ensure daily salt intake < 2300 mg/day  Hyperlipidemia Lab Results  Component Value Date/Time   LDLCALC 105 (H) 04/02/2020 03:27 PM   LDLDIRECT 128.8 01/17/2011 09:41 AM   . Pharmacist Clinical Goal(s): o Over the next 180 days, patient will work with PharmD and providers to achieve LDL goal < 100 . Current regimen:  o N/a . Interventions: o Diet/exercise recommendations . Patient self care activities - Over the next 180 days, patient will: o Try to start walking 2-3 times/week  Medication management . Pharmacist Clinical Goal(s): o Over the next 180 days, patient will work with PharmD and providers to maintain optimal  medication adherence . Current pharmacy: [facility] . Interventions o Comprehensive medication review performed. o Continue current medication management strategy. . Patient self care activities - Over the  next 180 days, patient will: o Take medications as prescribed o Report any questions or concerns to PharmD and/or provider(s) Initial goal documentation.      Hypertension   BP goal <140/90  BP Readings from Last 3 Encounters:  07/13/20 140/70  07/06/20 (!) 149/87  05/14/20 (!) 158/76   Patient checks BP at home 1-2x per week. Patient home BP readings are ranging: ~130s/70s-80s. Denies any dizziness.  We discussed diet and exercise extensively. Goal to start walking 2-3 times per week.  Plan  Continue current medications.   AFIB   Pulse Readings from Last 3 Encounters:  07/13/20 85  07/06/20 86  05/14/20 72   HR 80s BPM. On nadalol 20 mg once daily. Has only used lopressor 25 mg once over past yr. Patient is currently rate controlled on the following medications:  . Lopressor 25 mg prn  Lab Results  Component Value Date   INR 2.7 08/07/2020   INR 2.7 06/26/2020   INR 2.6 05/29/2020   Prevention of stroke in Afib. DOAC denied 03/2019. CHA2DS2/VAS 5. Denies any abnormal bruising, bleeding from nose or gums or blood in urine or stool. Patient is currently controlled on the following medications:   Warfarin 5 mg (2.5 mg x 2) every Mon, Wed, Fri; 2.5 mg (2.5 mg x 1) all other days (08/07/2020 inst.)  Plan  Continue current medications.  Essential Tremor    Patient is currently taking the following medications:   Nadalol 40 mg tablet - half tablet (20 mg) twice daily   Plan  Continue current medications.  Hyperlipidemia      Component Value Date/Time   CHOL 198 04/02/2020 1527   TRIG 145.0 04/02/2020 1527   HDL 64.20 04/02/2020 1527   LDLCALC 105 (H) 04/02/2020 1527   LDLDIRECT 128.8 01/17/2011 0941    Hepatic Function Latest Ref Rng & Units 04/02/2020  06/10/2019 09/18/2018  Total Protein 6.0 - 8.3 g/dL 6.9 7.1 7.5  Albumin 3.5 - 5.2 g/dL 4.4 4.1 4.4  AST 0 - 37 U/L 20 42(H) 19  ALT 0 - 35 U/L '19 25 20  ' Alk Phosphatase 39 - 117 U/L 90 82 67  Total Bilirubin 0.2 - 1.2 mg/dL 1.6(H) 1.6(H) 1.3(H)  Bilirubin, Direct 0.0 - 0.3 mg/dL - - -    Not at goal. Patient has failed these meds in past. Has not had MI/stroke.  Patient is currently taking the following medications:  . n/a  We discussed:  diet and exercise extensively.  Plan  Continue current medications.  GERD   Patient denies recent acid reflux.  Currently controlled on: Marland Kitchen Famotidine 20 mg once daily   We discussed: Avoidance of potential triggers such as caffeine, citrus juices and fatty foods.  Plan   Continue current medications.  Osteopenia / Osteoporosis   VITD  Date Value Ref Range Status  03/14/2017 33.01 30.00 - 100.00 ng/mL Final    Patient is currently controlled on the following medications:  . Calcium 500 mg twice daily . Vitamin d3 1000 units daily   We discussed:  Recommend [phone] units of vitamin D daily. Recommend 1200 mg of calcium daily from dietary and supplemental sources. Recommend weight-bearing and muscle strengthening exercises for building and maintaining bone density.   Plan  Continue current medications.  Vaccines   Immunization History  Administered Date(s) Administered  . Fluad Quad(high Dose 65+) 10/08/2019  . Influenza Split 12/01/2011, 11/05/2012  . Influenza Whole 01/07/2010, 10/08/2010  . Influenza, High Dose Seasonal PF 10/01/2014, 11/10/2015,  10/25/2016, 10/30/2017, 10/22/2018  . Influenza,inj,Quad PF,6+ Mos 08/23/2013  . PFIZER SARS-COV-2 Vaccination 01/04/2020, 01/25/2020  . Pneumococcal Conjugate-13 04/13/2015  . Pneumococcal Polysaccharide-23 12/26/2005  . Td 12/26/2001  . Tdap 01/23/2012  . Zoster 02/08/2010   Reviewed and discussed patient's vaccination history.  Planning on getting covid booster, flu,  shingrix vaccine.  Counseled on vaccines and appropriate spacing.   Plan  Recommended patient receive covid booster, annual flu, shingrix vaccine.  Medication Management Coordination   Receives prescription medications from:  [place], [place] - [address] [place] [address] Phone: [phone] Fax: [phone]  [facility] [place] [place] [facility] [address] [place] [address] Phone: [phone] Fax: [phone]   Denies any issues with current medication management.   Plan  Continue current medication management strategy. ___________________________ SDOH (Social Determinants of Health) assessments performed: Yes.  Future Appointments  Date Time Provider [place]  09/15/2020  1:30 PM [facility]  03/03/2021  1:00 PM [facility] CCM PHARMACIST [facility]   Visit follow-up:  . CPA follow-up: call pt with vaccine information. [email or web address] - to schedule vaccination. [phone] - can call for assistance finding and scheduling a testing site.  RPH follow-up: 6 month telephone visit.  [name], Pharm.D., BCGP Clinical Pharmacist [facility] [phone]

## 2020-09-01 ENCOUNTER — Ambulatory Visit: Payer: Medicare Other

## 2020-09-02 ENCOUNTER — Other Ambulatory Visit: Payer: Self-pay

## 2020-09-02 ENCOUNTER — Ambulatory Visit: Payer: Medicare Other

## 2020-09-02 DIAGNOSIS — I10 Essential (primary) hypertension: Secondary | ICD-10-CM

## 2020-09-02 DIAGNOSIS — I4819 Other persistent atrial fibrillation: Secondary | ICD-10-CM

## 2020-09-02 DIAGNOSIS — E785 Hyperlipidemia, unspecified: Secondary | ICD-10-CM

## 2020-09-02 NOTE — Patient Instructions (Signed)
Please review care plan below and call me at [phone] (direct line) with any questions!  Thank you, Adriana Hendricks., Clinical Pharmacist  Goals Addressed            This Visit's Progress   . PharmD Care Plan       CARE PLAN ENTRY (see longitudinal plan of care for additional care plan information)  Current Barriers:  . Chronic Disease Management support, education, and care coordination needs related to Hypertension, Hyperlipidemia, and Atrial Fibrillation   Hypertension / Afib BP Readings from Last 3 Encounters:  07/13/20 140/70  07/06/20 (!) 149/87  05/14/20 (!) 158/76   . Pulse Readings from Last 3 Encounters: .  07/13/20 . 85 .  07/06/20 . 86 .  05/14/20 . 72   . Pharmacist Clinical Goal(s): o Over the next 180 days, patient will work with PharmD and providers to maintain BP goal <140/90 . Current regimen:  o Anticoagulation - Warfarin 2.5 mg as directed by coumadin clinic  o Blood pressure / heart rate - Nadalol 20 mg once daily - Lopressor 25 mg once daily as needed . Interventions: o Continue current management . Patient self care activities - Over the next 180 days, patient will: o Check BP/HR at least once every 1-2 weeks, document, and provide at future appointments o Ensure daily salt intake < 2300 mg/day  Hyperlipidemia Lab Results  Component Value Date/Time   LDLCALC 105 (H) 04/02/2020 03:27 PM   LDLDIRECT 128.8 01/17/2011 09:41 AM   . Pharmacist Clinical Goal(s): o Over the next 180 days, patient will work with PharmD and providers to achieve LDL goal < 100 . Current regimen:  o N/a . Interventions: o Diet/exercise recommendations . Patient self care activities - Over the next 180 days, patient will: o Try to start walking 2-3 times/week  Medication management . Pharmacist Clinical Goal(s): o Over the next 180 days, patient will work with PharmD and providers to maintain optimal medication adherence . Current pharmacy:  [facility] . Interventions o Comprehensive medication review performed. o Continue current medication management strategy. . Patient self care activities - Over the next 180 days, patient will: o Take medications as prescribed o Report any questions or concerns to PharmD and/or provider(s) Initial goal documentation.      Adriana Hendricks was given information about Chronic Care Management services today including:  1. CCM service includes personalized support from designated clinical staff supervised by her physician, including individualized plan of care and coordination with other care providers 2. 24/7 contact phone numbers for assistance for urgent and routine care needs. 3. Standard insurance, coinsurance, copays and deductibles apply for chronic care management only during months in which we provide at least 20 minutes of these services. Most insurances cover these services at 100%, however patients may be responsible for any copay, coinsurance and/or deductible if applicable. This service may help you avoid the need for more expensive face-to-face services. 4. Only one practitioner may furnish and bill the service in a calendar month. 5. The patient may stop CCM services at any time (effective at the end of the month) by phone call to the office staff.  Patient agreed to services and verbal consent obtained.   The patient verbalized understanding of instructions provided today and agreed to receive a mailed copy of patient instruction and/or educational materials. Telephone follow up appointment with pharmacy team member scheduled for: See next appointment with "Care Management Staff" under "What's Next" below.   [name], Pharm.D., BCGP Clinical Pharmacist  [facility] [phone]  High Cholesterol  High cholesterol is a condition in which the blood has high levels of a white, waxy, fat-like substance (cholesterol). The human body needs small amounts of cholesterol. The liver  makes all the cholesterol that the body needs. Extra (excess) cholesterol comes from the food that we eat. Cholesterol is carried from the liver by the blood through the blood vessels. If you have high cholesterol, deposits (plaques) may build up on the walls of your blood vessels (arteries). Plaques make the arteries narrower and stiffer. Cholesterol plaques increase your risk for heart attack and stroke. Work with your health care provider to keep your cholesterol levels in a healthy range. What increases the risk? This condition is more likely to develop in people who:  Eat foods that are high in animal fat (saturated fat) or cholesterol.  Are overweight.  Are not getting enough exercise.  Have a family history of high cholesterol. What are the signs or symptoms? There are no symptoms of this condition. How is this diagnosed? This condition may be diagnosed from the results of a blood test.  If you are older than age 68, your health care provider may check your cholesterol every 4-6 years.  You may be checked more often if you already have high cholesterol or other risk factors for heart disease. The blood test for cholesterol measures:  "Bad" cholesterol (LDL cholesterol). This is the main type of cholesterol that causes heart disease. The desired level for LDL is less than 100.  "Good" cholesterol (HDL cholesterol). This type helps to protect against heart disease by cleaning the arteries and carrying the LDL away. The desired level for HDL is 60 or higher.  Triglycerides. These are fats that the body can store or burn for energy. The desired number for triglycerides is lower than 150.  Total cholesterol. This is a measure of the total amount of cholesterol in your blood, including LDL cholesterol, HDL cholesterol, and triglycerides. A healthy number is less than 200. How is this treated? This condition is treated with diet changes, lifestyle changes, and medicines. Diet  changes  This may include eating more whole grains, fruits, vegetables, nuts, and fish.  This may also include cutting back on red meat and foods that have a lot of added sugar. Lifestyle changes  Changes may include getting at least 40 minutes of aerobic exercise 3 times a week. Aerobic exercises include walking, biking, and swimming. Aerobic exercise along with a healthy diet can help you maintain a healthy weight.  Changes may also include quitting smoking. Medicines  Medicines are usually given if diet and lifestyle changes have failed to reduce your cholesterol to healthy levels.  Your health care provider may prescribe a statin medicine. Statin medicines have been shown to reduce cholesterol, which can reduce the risk of heart disease. Follow these instructions at home: Eating and drinking If told by your health care provider:  Eat chicken (without skin), fish, veal, shellfish, ground [place] breast, and round or loin cuts of red meat.  Do not eat fried foods or fatty meats, such as hot dogs and salami.  Eat plenty of fruits, such as apples.  Eat plenty of vegetables, such as broccoli, potatoes, and carrots.  Eat beans, peas, and lentils.  Eat grains such as barley, rice, couscous, and bulgur wheat.  Eat pasta without cream sauces.  Use skim or nonfat milk, and eat low-fat or nonfat yogurt and cheeses.  Do not eat or drink  whole milk, cream, ice cream, egg yolks, or hard cheeses.  Do not eat stick margarine or tub margarines that contain trans fats (also called partially hydrogenated oils).  Do not eat saturated tropical oils, such as coconut oil and palm oil.  Do not eat cakes, cookies, crackers, or other baked goods that contain trans fats.  General instructions  Exercise as directed by your health care provider. Increase your activity level with activities such as gardening, walking, and taking the stairs.  Take over-the-counter and prescription medicines only  as told by your health care provider.  Do not use any products that contain nicotine or tobacco, such as cigarettes and e-cigarettes. If you need help quitting, ask your health care provider.  Keep all follow-up visits as told by your health care provider. This is important. Contact a health care provider if:  You are struggling to maintain a healthy diet or weight.  You need help to start on an exercise program.  You need help to stop smoking. Get help right away if:  You have chest pain.  You have trouble breathing. This information is not intended to replace advice given to you by your health care provider. Make sure you discuss any questions you have with your health care provider. Document Revised: 12/15/2017 Document Reviewed: 06/11/2016 Elsevier Patient Education  [place].

## 2020-09-12 ENCOUNTER — Other Ambulatory Visit: Payer: Self-pay | Admitting: Adult Health

## 2020-09-15 ENCOUNTER — Other Ambulatory Visit: Payer: Self-pay

## 2020-09-15 ENCOUNTER — Ambulatory Visit (INDEPENDENT_AMBULATORY_CARE_PROVIDER_SITE_OTHER): Payer: Medicare Other | Admitting: Pharmacist

## 2020-09-15 DIAGNOSIS — I4819 Other persistent atrial fibrillation: Secondary | ICD-10-CM | POA: Diagnosis not present

## 2020-09-15 DIAGNOSIS — Z7901 Long term (current) use of anticoagulants: Secondary | ICD-10-CM

## 2020-09-15 LAB — POCT INR: INR: 2.2 (ref 2.0–3.0)

## 2020-09-22 DIAGNOSIS — Z23 Encounter for immunization: Secondary | ICD-10-CM | POA: Diagnosis not present

## 2020-10-12 ENCOUNTER — Other Ambulatory Visit: Payer: Self-pay

## 2020-10-12 ENCOUNTER — Ambulatory Visit (INDEPENDENT_AMBULATORY_CARE_PROVIDER_SITE_OTHER): Payer: Medicare Other

## 2020-10-12 ENCOUNTER — Encounter: Payer: Self-pay | Admitting: Family Medicine

## 2020-10-12 DIAGNOSIS — Z23 Encounter for immunization: Secondary | ICD-10-CM | POA: Diagnosis not present

## 2020-10-12 NOTE — Progress Notes (Signed)
I have reviewed and agree with note, evaluation, plan.   Benoit Meech, MD  

## 2020-10-12 NOTE — Progress Notes (Signed)
Patient came into the office with her husband to receive her flu shot. She tolerated her injection well. No other questions or concerns at this time.

## 2020-10-19 ENCOUNTER — Other Ambulatory Visit: Payer: Self-pay | Admitting: Cardiology

## 2020-10-28 ENCOUNTER — Ambulatory Visit (INDEPENDENT_AMBULATORY_CARE_PROVIDER_SITE_OTHER): Payer: Medicare Other

## 2020-10-28 ENCOUNTER — Other Ambulatory Visit: Payer: Self-pay

## 2020-10-28 DIAGNOSIS — I4819 Other persistent atrial fibrillation: Secondary | ICD-10-CM | POA: Diagnosis not present

## 2020-10-28 DIAGNOSIS — Z7901 Long term (current) use of anticoagulants: Secondary | ICD-10-CM

## 2020-10-28 LAB — POCT INR: INR: 2.2 (ref 2.0–3.0)

## 2020-10-28 NOTE — Patient Instructions (Signed)
Continue with 1 tablet daily except 2 tablets each Monday, Wednesday and Friday.  Repeat INR in 6 weeks 

## 2020-11-03 DIAGNOSIS — D2262 Melanocytic nevi of left upper limb, including shoulder: Secondary | ICD-10-CM | POA: Diagnosis not present

## 2020-11-03 DIAGNOSIS — L820 Inflamed seborrheic keratosis: Secondary | ICD-10-CM | POA: Diagnosis not present

## 2020-11-03 DIAGNOSIS — D1801 Hemangioma of skin and subcutaneous tissue: Secondary | ICD-10-CM | POA: Diagnosis not present

## 2020-11-03 DIAGNOSIS — L814 Other melanin hyperpigmentation: Secondary | ICD-10-CM | POA: Diagnosis not present

## 2020-11-03 DIAGNOSIS — D225 Melanocytic nevi of trunk: Secondary | ICD-10-CM | POA: Diagnosis not present

## 2020-11-03 DIAGNOSIS — L570 Actinic keratosis: Secondary | ICD-10-CM | POA: Diagnosis not present

## 2020-11-03 DIAGNOSIS — D0462 Carcinoma in situ of skin of left upper limb, including shoulder: Secondary | ICD-10-CM | POA: Diagnosis not present

## 2020-11-03 DIAGNOSIS — L821 Other seborrheic keratosis: Secondary | ICD-10-CM | POA: Diagnosis not present

## 2020-12-09 ENCOUNTER — Ambulatory Visit (INDEPENDENT_AMBULATORY_CARE_PROVIDER_SITE_OTHER): Payer: Medicare Other

## 2020-12-09 ENCOUNTER — Other Ambulatory Visit: Payer: Self-pay

## 2020-12-09 DIAGNOSIS — I4819 Other persistent atrial fibrillation: Secondary | ICD-10-CM

## 2020-12-09 DIAGNOSIS — Z7901 Long term (current) use of anticoagulants: Secondary | ICD-10-CM

## 2020-12-09 LAB — POCT INR: INR: 2.3 (ref 2.0–3.0)

## 2020-12-09 NOTE — Patient Instructions (Signed)
Continue with 1 tablet daily except 2 tablets each Monday, Wednesday and Friday.  Repeat INR in 6 weeks 

## 2020-12-10 DIAGNOSIS — Z20822 Contact with and (suspected) exposure to covid-19: Secondary | ICD-10-CM | POA: Diagnosis not present

## 2020-12-24 ENCOUNTER — Telehealth: Payer: Self-pay | Admitting: Cardiology

## 2020-12-24 MED ORDER — AMOXICILLIN 500 MG PO TABS: 2000.0000 mg | ORAL_TABLET | Freq: Once | ORAL | 3 refills | Status: AC

## 2020-12-24 NOTE — Telephone Encounter (Signed)
*  STAT* If patient is at the pharmacy, call can be transferred to refill team.   1. Which medications need to be refilled? (please list name of each medication and dose if known) Amoxicillin (patient states last time the medication was prescribed for 2000 MG, however, she is unsure what is recommended)  2. Which pharmacy/location (including street and city if local pharmacy) is medication to be sent to? [facility] [address] [place] - [address]  3. Do they need a 30 day or 90 day supply?  Patient is requesting a short-term supply. She states when she last had dental work, she was told she would need to take an antibiotic for all future dental work. She states she has a dental cleaning scheduled for 12/30/20. Are we able to assist? Please advise.

## 2020-12-24 NOTE — Telephone Encounter (Signed)
Amoxicillin sent to preferred pharmacy for dental work.

## 2020-12-27 ENCOUNTER — Other Ambulatory Visit: Payer: Self-pay | Admitting: Cardiology

## 2021-01-11 ENCOUNTER — Other Ambulatory Visit: Payer: Self-pay | Admitting: Cardiology

## 2021-01-12 ENCOUNTER — Telehealth: Payer: Self-pay

## 2021-01-12 NOTE — Telephone Encounter (Signed)
..  Type of form received:  Health Info form from [facility]  Additional comments:   Received by:  Wayne Brunker   Form should be Faxed to:  [phone]  Form should be mailed to:    Is patient requesting call for pickup:   Form placed:    Attach charge sheet.  Provider will determine charge  Individual made aware of 3-5 business day turn around (Y/N)?  Spouse is requesting call once

## 2021-01-12 NOTE — Telephone Encounter (Signed)
Noted  

## 2021-01-17 ENCOUNTER — Encounter: Payer: Self-pay | Admitting: Family Medicine

## 2021-01-17 ENCOUNTER — Other Ambulatory Visit: Payer: Self-pay

## 2021-01-17 DIAGNOSIS — M545 Low back pain, unspecified: Secondary | ICD-10-CM

## 2021-01-20 ENCOUNTER — Other Ambulatory Visit: Payer: Self-pay

## 2021-01-20 ENCOUNTER — Encounter: Payer: Self-pay | Admitting: Physical Therapy

## 2021-01-20 ENCOUNTER — Ambulatory Visit: Payer: Medicare Other | Attending: Family Medicine | Admitting: Physical Therapy

## 2021-01-20 DIAGNOSIS — M62838 Other muscle spasm: Secondary | ICD-10-CM | POA: Insufficient documentation

## 2021-01-20 DIAGNOSIS — M5441 Lumbago with sciatica, right side: Secondary | ICD-10-CM | POA: Insufficient documentation

## 2021-01-20 NOTE — Therapy (Signed)
[facility] [address], [place], [place], [address] Phone: [phone]   Fax:  [phone]  Physical Therapy Evaluation  Patient Details  Name: Adriana Hendricks MRN: [id number] Date of Birth: 1935/05/17 Referring Provider (PT): Garret Reddish, MD   Encounter Date: 01/20/2021   PT End of Session - 01/20/21 0848    Visit Number 1    Date for PT Re-Evaluation 03/17/21    Authorization Type MCR    Progress Note Due on Visit 10    PT Start Time 0848    PT Stop Time 0945    PT Time Calculation (min) 57 min    Activity Tolerance Patient tolerated treatment well;Patient limited by pain    Behavior During Therapy [facility] for tasks assessed/performed           Past Medical History:  Diagnosis Date  . Achilles tendon rupture   . Arthritis of hand, right   . Atrial flutter with rapid ventricular response ([place])   . Carpal tunnel syndrome 09/04/2013  . Collagenous colitis 2005  . Colon polyps 2010   Tubular adenoma and hyperplastic  . Dental infection 10/25/2014  . Diverticulosis   . Esophageal stenosis   . Essential tremor   . GERD (gastroesophageal reflux disease)    hx hiatal hernia, hx esophagitis, hx stricture  . Hiatal hernia   . Hypertension   . Lower extremity neuropathy 08/23/2013   On B12 therapy with numbness in the feet bilaterally no evidence of diabetes   . Ocular migraine    jagged vision, a few per month  . OSA (obstructive sleep apnea) 12/21/2016   on CPAP  . Peripheral neuropathy    treated by Dr Posey Pronto (07/2015)  . Persistent atrial fibrillation (HCC)    Rate control with beta-blocker and anticoagulated with warfarin  . Pleural effusion, left   . S/P Maze operation for atrial fibrillation 06/12/2019   Complete bilateral atrial lesion set using cryothermy and bipolar radiofrequency ablation with clipping of LA appendage via right mini thoracotomy approach  . S/P minimally invasive tricuspid valve  repair 06/12/2019   Complex valvuloplasty including autologous pericardial patch augmentation of anterior and posterior leaflets with 28 mm Edwards mc3 ring annuloplasty via right mini thoracotomy approach  . Severe tricuspid regurgitation 07/2018   Noted Aug 2019 during a fib workup. Severe LAE as well  . Sinus node dysfunction [facility])     Past Surgical History:  Procedure Laterality Date  . 48 hr Holter Monitor  07/2018   Persistent Afib (rate 33 - 124 bpm)  . APPENDECTOMY    . CARDIAC CATHETERIZATION    . CARDIOVERSION N/A 10/02/2018   Procedure: CARDIOVERSION;  Surgeon: Acie Fredrickson, Wonda Cheng, MD;  Location: [facility] ENDOSCOPY;  Service: Cardiovascular;  Laterality: N/A;  . CARPAL TUNNEL RELEASE    . CATARACT EXTRACTION    . EXCISION MORTON'S NEUROMA     Right foot  . EYE SURGERY     Cataracts with implants-bilateral  . INTRAOCULAR LENS IMPLANT, SECONDARY    . IR THORACENTESIS ASP PLEURAL SPACE W/IMG GUIDE  07/10/2019  . IR THORACENTESIS ASP PLEURAL SPACE W/IMG GUIDE  07/10/2019  . IR THORACENTESIS ASP PLEURAL SPACE W/IMG GUIDE  07/26/2019  . MINIMALLY INVASIVE MAZE PROCEDURE N/A 06/12/2019   Procedure: MINIMALLY INVASIVE MAZE PROCEDURE;  Surgeon: Rexene Alberts, MD;  Location: [facility];  Service: Open Heart Surgery;  Laterality: N/A;  . MINIMALLY INVASIVE TRICUSPID VALVE REPAIR Right 06/12/2019   Procedure: MINIMALLY INVASIVE  TRICUSPID VALVE REPAIR USING EDWARDS MC3 T28 ANNULOPLASTY RING, INSERTION TEMPORARY TRANSVENOUS AV PACING LEAD;  Surgeon: Purcell Nails, MD;  Location: [facility] OR;  Service: Open Heart Surgery;  Laterality: Right;  . Moles removed    . MOUTH SURGERY     (For Exostosis)  . NUCLEAR  07/2018   EF 71 %. LOW RISK - no ischemia or Infarct.   Marland Kitchen RIGHT/LEFT HEART CATH AND CORONARY ANGIOGRAPHY N/A 01/16/2019   Procedure: RIGHT/LEFT HEART CATH AND CORONARY ANGIOGRAPHY;  Surgeon: Marykay Lex, MD;  Location: [facility] INVASIVE CV LAB;; Angiographically normal coronary arteries.  Normal LVEDP.  Upper Limit of Normal PAP~23 mmHg & PCWP 18 mmHg.  LVEDP of 7 mmHg. -With mean PAP of 23 mmHg there is a TPG suggesting a primary pulmonary etiology.  Marland Kitchen ROTATOR CUFF REPAIR    . TEE WITHOUT CARDIOVERSION N/A 01/16/2019   Procedure: TRANSESOPHAGEAL ECHOCARDIOGRAM (TEE);  Surgeon: Quintella Reichert, MD;  Location: [facility] ENDOSCOPY;;  Severe TR due to poor coaptation of the leaflets from annular dilation.  Mild to moderate mitral vegetation with mildly restricted mobility of the posterior leaflet.  EF 55-60% with no R WMA.  Severe RA and LA dilation.  . TEE WITHOUT CARDIOVERSION N/A 06/12/2019   Procedure: TRANSESOPHAGEAL ECHOCARDIOGRAM (TEE);  Surgeon: Purcell Nails, MD;  Location: [facility] OR;  Service: Open Heart Surgery;  Laterality: N/A;  . TONSILLECTOMY    . TRANSTHORACIC ECHOCARDIOGRAM  08/05/2019   First postop TAVR: EF 60 to 65%. Mod concentric LVH. Grade III DD (? w/ Mild LA dilation).  Rheumatic MV w/ mod thickening & Ca2+.  Anterior leaflet has mild doming but no MVP.  Mild-mod MR and mild to mod MS (mean MVA gradient 7 mmHg).  ~ functional bicuspid AoV w/ Mod Sclerosis - no AS.  Thoracic Aorta (4.1 & 3.7 @ Sinus of  V, prox Ascending) - mild dilation  . TRANSTHORACIC ECHOCARDIOGRAM  08/02/2018   Normal LV Fxn (EF 60-65%) no RWMA.  Severe LA dilation & Severe TR.    There were no vitals filed for this visit.    Subjective Assessment - 01/20/21 0854    Subjective Patient is moving in a month from her home to a retirement home. About 10 days ago she bent forward to lift a milk crate and pulled a muscle in her back. It has worsened to shooting pain down the anterior right leg to the foot. Pain is excruciating when she gets it. Walking and reaching overhead with trunk extension is the worst. Standing not as bad as walking. Has slept in the bed for 2 nights on back and right side.    Pertinent History HTN, RCR, peripheral neuropathy (feet), mitral valve replacement    Limitations Walking    How long can  you sit comfortably? not limited    How long can you stand comfortably? always uncomfortable    How long can you walk comfortably? immediate    Patient Stated Goals to get rid of your pain    Currently in Pain? Yes    Pain Score 9    when walking; 1/10 with sitting   Pain Location Back    Pain Orientation Right;Lower    Pain Descriptors / Indicators Aching    Pain Type Acute pain    Pain Radiating Towards right front of the leg    Pain Onset 1 to 4 weeks ago    Pain Frequency Constant    Aggravating Factors  walking, lumbar extension  Pain Relieving Factors bending forward, sitting    Effect of Pain on Daily Activities moving soon              [facility] PT Assessment - 01/20/21 0001      Assessment   Medical Diagnosis LBP    Referring Provider (PT) Garret Reddish, MD    Onset Date/Surgical Date 01/10/21    Hand Dominance Right    Prior Therapy yes      Precautions   Precautions None      Restrictions   Weight Bearing Restrictions No      Balance Screen   Has the patient fallen in the past 6 months No    Has the patient had a decrease in activity level because of a fear of falling?  No    Is the patient reluctant to leave their home because of a fear of falling?  No      Home Environment   Additional Comments moving to one level apartment      Prior Function   Level of Independence Independent    Vocation Retired      Associate Professor   Overall Cognitive Status Within Functional Limits for tasks assessed      Observation/Other Assessments   Focus on Therapeutic Outcomes (FOTO)  FSPM = 36 (goal 64)      Posture/Postural Control   Posture Comments stands in 10 deg flexion      ROM / Strength   AROM / PROM / Strength AROM;Strength      AROM   AROM Assessment Site Lumbar    Lumbar Flexion 75% limited by HS    Lumbar Extension -10 degrees; unable to come to neutral without severe pain    Lumbar - Right Side Bend 25% no increased pain    Lumbar - Left Side Bend 25%  pull on right no increased pain    Lumbar - Right Rotation 15% bil with radicular pain down right leg with both directions      Strength   Overall Strength Comments grossly 5/5 Bil LE in sitting except hip flex 4+/5 and weak core noted.      Flexibility   Soft Tissue Assessment /Muscle Length yes    Hamstrings tight bil    Piriformis right WNL    Quadratus Lumborum tight right      Palpation   Spinal mobility unable to assess today due to pain    Palpation comment marked tenderness along Rt iliac crest and along SIJ; Marked tenderness in left gluteals      Transfers   Comments pain immediate upon sit to stand; pain with bed mobility but independent                      Objective measurements completed on examination: See above findings.       [facility] Adult PT Treatment/Exercise - 01/20/21 0001      Modalities   Modalities Electrical Stimulation      Electrical Stimulation   Electrical Stimulation Location right lumbar; bil gluteals    Electrical Stimulation Action IFC    Electrical Stimulation Parameters x 15 min to tolerance in sitting    Electrical Stimulation Goals Pain;Tone      Manual Therapy   Manual therapy comments Skilled palpation and monitoring of soft tissues during DN            Trigger Point Dry Needling - 01/20/21 0001    Consent Given? Yes    Education  Handout Provided Previously provided    Muscles Treated Back/Hip Gluteus medius;Gluteus minimus;Gluteus maximus;Piriformis;Erector spinae;Quadratus lumborum    Other Dry Needling right    Gluteus Minimus Response Palpable increased muscle length    Gluteus Medius Response Twitch response elicited;Palpable increased muscle length    Gluteus Maximus Response Palpable increased muscle length    Piriformis Response Palpable increased muscle length    Erector spinae Response Palpable increased muscle length    Quadratus Lumborum Response Palpable increased muscle length                 PT Education - 01/20/21 1044    Education Details SKTC, DKTC; DN education and aftercare    Person(s) Educated Patient    Methods Explanation;Demonstration    Comprehension Verbalized understanding;Returned demonstration            PT Short Term Goals - 01/20/21 1312      PT SHORT TERM GOAL #1   Title be independent in initial HEP    Time 4    Period Weeks    Status New    Target Date 02/17/21      PT SHORT TERM GOAL #2   Title report a 30% reduction in the frequency and intensity of LBP with ADLs and self-care    Baseline -    Time 4    Period Weeks    Status New             PT Long Term Goals - 01/20/21 1313      PT LONG TERM GOAL #1   Title be independent in advanced HEP    Time 8    Period Weeks    Status New    Target Date 03/17/21      PT LONG TERM GOAL #2   Title report decreased LBP by 75% or more and report no LE sx.    Time 8    Period Weeks    Status New      PT LONG TERM GOAL #3   Title Patient to demonstrate lumbar ext and rotation WFLs to perform ADLS including reaching OH    Time 8    Period Weeks    Status New      PT LONG TERM GOAL #4   Title Ind in the use of correct body mechanics for ADLS to prevent further injury    Time 8    Period Weeks    Status New      PT LONG TERM GOAL #5   Title FOTO FSPM >= 64    Baseline baseline 36    Time 8    Period Weeks    Status New                  Plan - 01/20/21 1045    Clinical Impression Statement Patient presents with acute LBP after reaching to lift a milk crate 10 days ago. Pain has worsened and is now down her Rt LE to her foot. Pain is worse with walking and lumbar extension, better with sitting and forward flexion. She stands and walks in 10 deg of lumbar flexion. She is unable to walk to do ADLS and has only been able to sleep in her bed the past two nights. She has increased tone and trigger points in bil gluteals Left>Right and spasm in her right QL and lumbar paraspinals.  She has some weakness in her hip flexors and core. Full assessment was not able to be completed due  to patient's pain level. She requested and responded well to DN today. Initial trial of estim as well to help with muscle spasm.  Patient is moving soon from her house to an aparment in a retirement community. She will benefit from PT to address these benefits and return her to her PLOF.    Personal Factors and Comorbidities Comorbidity 3+    Comorbidities HTN, RCR, peripheral neuropathy (feet), mitral valve replacement    Examination-Activity Limitations Bed Mobility;Lift;Carry;Sleep;Stand;Locomotion Level    Examination-Participation Restrictions Shop;Other   unable to pack up house   Stability/Clinical Decision Making Evolving/Moderate complexity    Clinical Decision Making Moderate    PT Frequency 2x / week    PT Duration 8 weeks    PT Treatment/Interventions ADLs/Self Care Home Management;Aquatic Therapy;Cryotherapy;Electrical Stimulation;Moist Heat;Traction;Therapeutic exercise;Neuromuscular re-education;Patient/family education;Manual techniques;Dry needling;Taping    PT Next Visit Plan Work on pain control and getting patient back to neutral spine; Address TPs in left gluteals also. Initiate HEP (pt given SKTC/DKTC)    Consulted and Agree with Plan of Care Patient           Patient will benefit from skilled therapeutic intervention in order to improve the following deficits and impairments:  Abnormal gait,Decreased mobility,Increased muscle spasms,Pain,Decreased range of motion,Decreased activity tolerance,Decreased strength,Impaired flexibility,Postural dysfunction  Visit Diagnosis: Acute right-sided low back pain with right-sided sciatica  Other muscle spasm     Problem List Patient Active Problem List   Diagnosis Date Noted  . Palpitations 11/10/2019  . Pleural effusion, left   . Sinus node dysfunction (HCC)   . S/P minimally invasive tricuspid valve repair 06/12/2019  .  S/P Maze operation for atrial fibrillation 06/12/2019  . 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 Bicuspid aortic valve 01/28/2019  . Tricuspid valve insufficiency   . Severe tricuspid regurgitation   . Long term (current) use of anticoagulants 08/28/2018  . Persistent atrial fibrillation ([place])   . DOE (dyspnea on exertion) 07/26/2018  . Osteopenia 03/13/2017  . Ocular migraine 03/13/2017  . OSA (obstructive sleep apnea) 12/21/2016  . IBS (irritable bowel syndrome) 09/12/2016  . History of adenomatous polyp of colon 09/12/2016  . Former smoker 09/12/2016  . Carpal tunnel syndrome 09/04/2013  . Lower extremity neuropathy 08/23/2013  . Achilles tendon tear 01/17/2011  . Hyperlipemia 01/07/2010  . GERD with stricture 06/19/2009  . ACNE ROSACEA 04/03/2008  . Essential tremor 11/16/2007  . Essential hypertension 11/16/2007   Madelyn Flavors PT 01/20/2021, 1:18 PM  [facility] [address], [place], [place], [address] Phone: [phone]   Fax:  [phone]  Name: Adriana Hendricks MRN: [id number] Date of Birth: 03-15-1935

## 2021-01-20 NOTE — Patient Instructions (Signed)

## 2021-01-21 ENCOUNTER — Ambulatory Visit: Payer: Medicare Other | Admitting: Physical Therapy

## 2021-01-21 DIAGNOSIS — M5441 Lumbago with sciatica, right side: Secondary | ICD-10-CM | POA: Diagnosis not present

## 2021-01-21 DIAGNOSIS — M62838 Other muscle spasm: Secondary | ICD-10-CM | POA: Diagnosis not present

## 2021-01-21 NOTE — Patient Instructions (Signed)
Access Code: [email or web address] URL: [email or web address] Date: 01/21/2021 Prepared by: Ruben Im  Exercises Supine Lower Trunk Rotation - 1 x daily - 7 x weekly - 1 sets - 10 reps Bent Knee Fallouts - 1 x daily - 7 x weekly - 1 sets - 10 reps Supine Sciatic Nerve Glide - 1 x daily - 7 x weekly - 1 sets - 10 reps

## 2021-01-21 NOTE — Therapy (Signed)
[facility] [address], [place], [place], [address] Phone: [phone]   Fax:  [phone]  Physical Therapy Treatment  Patient Details  Name: Adriana Hendricks MRN: [id number] Date of Birth: Apr 27, 1935 Referring Provider (PT): Garret Reddish, MD   Encounter Date: 01/21/2021   PT End of Session - 01/21/21 1351    Visit Number 2    Date for PT Re-Evaluation 03/17/21    Authorization Type MCR    Progress Note Due on Visit 10    PT Start Time 1230    PT Stop Time 1320    PT Time Calculation (min) 50 min    Activity Tolerance Patient tolerated treatment well;Patient limited by pain           Past Medical History:  Diagnosis Date  . Achilles tendon rupture   . Arthritis of hand, right   . Atrial flutter with rapid ventricular response ([place])   . Carpal tunnel syndrome 09/04/2013  . Collagenous colitis 2005  . Colon polyps 2010   Tubular adenoma and hyperplastic  . Dental infection 10/25/2014  . Diverticulosis   . Esophageal stenosis   . Essential tremor   . GERD (gastroesophageal reflux disease)    hx hiatal hernia, hx esophagitis, hx stricture  . Hiatal hernia   . Hypertension   . Lower extremity neuropathy 08/23/2013   On B12 therapy with numbness in the feet bilaterally no evidence of diabetes   . Ocular migraine    jagged vision, a few per month  . OSA (obstructive sleep apnea) 12/21/2016   on CPAP  . Peripheral neuropathy    treated by Dr Posey Pronto (07/2015)  . Persistent atrial fibrillation (HCC)    Rate control with beta-blocker and anticoagulated with warfarin  . Pleural effusion, left   . S/P Maze operation for atrial fibrillation 06/12/2019   Complete bilateral atrial lesion set using cryothermy and bipolar radiofrequency ablation with clipping of LA appendage via right mini thoracotomy approach  . S/P minimally invasive tricuspid valve repair 06/12/2019   Complex valvuloplasty including autologous  pericardial patch augmentation of anterior and posterior leaflets with 28 mm Edwards mc3 ring annuloplasty via right mini thoracotomy approach  . Severe tricuspid regurgitation 07/2018   Noted Aug 2019 during a fib workup. Severe LAE as well  . Sinus node dysfunction [facility])     Past Surgical History:  Procedure Laterality Date  . 48 hr Holter Monitor  07/2018   Persistent Afib (rate 33 - 124 bpm)  . APPENDECTOMY    . CARDIAC CATHETERIZATION    . CARDIOVERSION N/A 10/02/2018   Procedure: CARDIOVERSION;  Surgeon: Acie Fredrickson, Wonda Cheng, MD;  Location[facility] ENDOSCOPY;  Service: Cardiovascular;  Laterality: N/A;  . CARPAL TUNNEL RELEASE    . CATARACT EXTRACTION    . EXCISION MORTON'S NEUROMA     Right foot  . EYE SURGERY     Cataracts with implants-bilateral  . INTRAOCULAR LENS IMPLANT, SECONDARY    . IR THORACENTESIS ASP PLEURAL SPACE W/IMG GUIDE  07/10/2019  . IR THORACENTESIS ASP PLEURAL SPACE W/IMG GUIDE  07/10/2019  . IR THORACENTESIS ASP PLEURAL SPACE W/IMG GUIDE  07/26/2019  . MINIMALLY INVASIVE MAZE PROCEDURE N/A 06/12/2019   Procedure: MINIMALLY INVASIVE MAZE PROCEDURE;  Surgeon: Rexene Alberts, MD;  Location: [place];  Service: Open Heart Surgery;  Laterality: N/A;  . MINIMALLY INVASIVE TRICUSPID VALVE REPAIR Right 06/12/2019   Procedure: MINIMALLY INVASIVE TRICUSPID VALVE REPAIR USING EDWARDS MC3 T28 ANNULOPLASTY RING, INSERTION  TEMPORARY TRANSVENOUS AV PACING LEAD;  Surgeon: Rexene Alberts, MD;  Location: [place];  Service: Open Heart Surgery;  Laterality: Right;  . Moles removed    . MOUTH SURGERY     (For Exostosis)  . NUCLEAR  07/2018   EF 71 %. LOW RISK - no ischemia or Infarct.   Marland Kitchen RIGHT/LEFT HEART CATH AND CORONARY ANGIOGRAPHY N/A 01/16/2019   Procedure: RIGHT/LEFT HEART CATH AND CORONARY ANGIOGRAPHY;  Surgeon: Leonie Man, MD;  Location: [place] CV LAB;; Angiographically normal coronary arteries.  Normal LVEDP. Upper Limit of Normal PAP~23 mmHg & PCWP 18 mmHg.  LVEDP of 7  mmHg. -With mean PAP of 23 mmHg there is a TPG suggesting a primary pulmonary etiology.  Marland Kitchen ROTATOR CUFF REPAIR    . TEE WITHOUT CARDIOVERSION N/A 01/16/2019   Procedure: TRANSESOPHAGEAL ECHOCARDIOGRAM (TEE);  Surgeon: Sueanne Margarita, MD;  Location: [facility] ENDOSCOPY;;  Severe TR due to poor coaptation of the leaflets from annular dilation.  Mild to moderate mitral vegetation with mildly restricted mobility of the posterior leaflet.  EF 55-60% with no R WMA.  Severe RA and LA dilation.  . TEE WITHOUT CARDIOVERSION N/A 06/12/2019   Procedure: TRANSESOPHAGEAL ECHOCARDIOGRAM (TEE);  Surgeon: Rexene Alberts, MD;  Location: [place];  Service: Open Heart Surgery;  Laterality: N/A;  . TONSILLECTOMY    . TRANSTHORACIC ECHOCARDIOGRAM  08/05/2019   First postop TAVR: EF 60 to 65%. Mod concentric LVH. Grade III DD (? w/ Mild LA dilation).  Rheumatic MV w/ mod thickening & Ca2+.  Anterior leaflet has mild doming but no MVP.  Mild-mod MR and mild to mod MS (mean MVA gradient 7 mmHg).  ~ functional bicuspid AoV w/ Mod Sclerosis - no AS.  Thoracic Aorta (4.1 & 3.7 @ Sinus of  V, prox Ascending) - mild dilation  . TRANSTHORACIC ECHOCARDIOGRAM  08/02/2018   Normal LV Fxn (EF 60-65%) no RWMA.  Severe LA dilation & Severe TR.    There were no vitals filed for this visit.   Subjective Assessment - 01/21/21 1231    Subjective I liked the DN and ES.  I got uptight getting paperwork together to move the [employer].  Hard to straighten up after sitting.  Right LE achiness to ankle.    Pertinent History HTN, RCR, peripheral neuropathy (feet), mitral valve replacement    Currently in Pain? Yes    Pain Score 7     Pain Location Back    Pain Orientation Right    Pain Type Acute pain    Pain Frequency Constant    Aggravating Factors  walking, lumbar extension                             [facility] Adult PT Treatment/Exercise - 01/21/21 0001      Self-Care   Self-Care Other Self-Care Comments     Other Self-Care Comments  pt has borrowed a home TENS unit but hasn't used yet;  discussed 2 electrode placement and wear time      Lumbar Exercises: Supine   Other Supine Lumbar Exercises review of low level mobility ex benefits: lumbar rotation, supine clams, neural glide    Other Supine Lumbar Exercises ankle pumps      Lumbar Exercises: Sidelying   Clam Right;5 reps      Electrical Stimulation   Electrical Stimulation Location right lumbar; bil gluteals    Electrical Stimulation Action IFC  Electrical Stimulation Parameters seated 22 ma 10 min to right back and LE    Electrical Stimulation Goals Pain;Tone      Manual Therapy   Joint Mobilization right long leg distraction grade 2, right hip inferior grade 2 3x; sidelying neutral gapping and pelvic distraction    Soft tissue mobilization instrument assisted [place] to posterior hip, thigh, leg                  PT Education - 01/21/21 1313    Education Details lumbar rotation, supine bent knee fallouts, supine gliding    Person(s) Educated Patient    Methods Explanation;Demonstration;Handout    Comprehension Returned demonstration;Verbalized understanding            PT Short Term Goals - 01/20/21 1312      PT SHORT TERM GOAL #1   Title be independent in initial HEP    Time 4    Period Weeks    Status New    Target Date 02/17/21      PT SHORT TERM GOAL #2   Title report a 30% reduction in the frequency and intensity of LBP with ADLs and self-care    Baseline -    Time 4    Period Weeks    Status New             PT Long Term Goals - 01/20/21 1313      PT LONG TERM GOAL #1   Title be independent in advanced HEP    Time 8    Period Weeks    Status New    Target Date 03/17/21      PT LONG TERM GOAL #2   Title report decreased LBP by 75% or more and report no LE sx.    Time 8    Period Weeks    Status New      PT LONG TERM GOAL #3   Title Patient to demonstrate lumbar ext and rotation WFLs  to perform ADLS including reaching OH    Time 8    Period Weeks    Status New      PT LONG TERM GOAL #4   Title Ind in the use of correct body mechanics for ADLS to prevent further injury    Time 8    Period Weeks    Status New      PT LONG TERM GOAL #5   Title FOTO FSPM >= 64    Baseline baseline 36    Time 8    Period Weeks    Status New                 Plan - 01/21/21 1352    Clinical Impression Statement The patient reports pain relief after yesterday's treatment including DN and ES/heat however she reports an exacerbation as she has been rushing around this morning getting paperwork together.  Her pain is primarily in right lumbo/sacral region constantly and intermittently in right LE radiating to her ankle.  Good response to gentle hip and lumbo sacral joint mobs and soft tissue mobilization today.  Mild neural signs with nerve glides in supine but no worse overall.  Also with good response to ES today with instruction on set up for her borrowed TENS unit for home.  Therapist monitoring response with all interventions.    Comorbidities HTN, RCR, peripheral neuropathy (feet), mitral valve replacement    Examination-Activity Limitations Bed Mobility;Lift;Carry;Sleep;Stand;Locomotion Level    Stability/Clinical Decision Making Evolving/Moderate complexity  PT Frequency 2x / week    PT Duration 8 weeks    PT Treatment/Interventions ADLs/Self Care Home Management;Aquatic Therapy;Cryotherapy;Electrical Stimulation;Moist Heat;Traction;Therapeutic exercise;Neuromuscular re-education;Patient/family education;Manual techniques;Dry needling;Taping    PT Next Visit Plan DN as needed; manual therapy; review neural glide and and low level mobility ex's; ES/heat in sitting    PT Home Exercise Plan [id number]           Patient will benefit from skilled therapeutic intervention in order to improve the following deficits and impairments:  Abnormal gait,Decreased mobility,Increased  muscle spasms,Pain,Decreased range of motion,Decreased activity tolerance,Decreased strength,Impaired flexibility,Postural dysfunction  Visit Diagnosis: Acute right-sided low back pain with right-sided sciatica  Other muscle spasm     Problem List Patient Active Problem List   Diagnosis Date Noted  . Palpitations 11/10/2019  . Pleural effusion, left   . Sinus node dysfunction (HCC)   . S/P minimally invasive tricuspid valve repair 06/12/2019  . S/P Maze operation for atrial fibrillation 06/12/2019  . 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 Bicuspid aortic valve 01/28/2019  . Tricuspid valve insufficiency   . Severe tricuspid regurgitation   . Long term (current) use of anticoagulants 08/28/2018  . Persistent atrial fibrillation ([place])   . DOE (dyspnea on exertion) 07/26/2018  . Osteopenia 03/13/2017  . Ocular migraine 03/13/2017  . OSA (obstructive sleep apnea) 12/21/2016  . IBS (irritable bowel syndrome) 09/12/2016  . History of adenomatous polyp of colon 09/12/2016  . Former smoker 09/12/2016  . Carpal tunnel syndrome 09/04/2013  . Lower extremity neuropathy 08/23/2013  . Achilles tendon tear 01/17/2011  . Hyperlipemia 01/07/2010  . GERD with stricture 06/19/2009  . ACNE ROSACEA 04/03/2008  . Essential tremor 11/16/2007  . Essential hypertension 11/16/2007   Ruben Im, PT 01/21/21 3:06 PM Phone: [phone] Fax: [phone] Alvera Singh 01/21/2021, 3:06 PM  [facility] [address] [place], [place], [address] Phone: [phone]   Fax:  [phone]  Name: Adriana Hendricks MRN: [id number] Date of Birth: 1935-12-21

## 2021-01-22 ENCOUNTER — Telehealth: Payer: Self-pay

## 2021-01-22 NOTE — Chronic Care Management (AMB) (Signed)
Chronic Care Management Pharmacy Assistant   Name: Adriana Hendricks  MRN: [id number] DOB: 08/08/1935  Reason for Encounter: Disease State/ General Adherence Call   PCP : Marin Olp, MD  Allergies:   Allergies  Allergen Reactions  . Levofloxacin Other (See Comments)    Developed tendon pain. (Has a history of a Achilles tendon tear)  . Potassium Chloride Hives  . Clarithromycin Nausea Only  . Erythromycin Base Nausea And Vomiting  . Metronidazole Rash  . Penicillins Rash and Other (See Comments)    Did it involve swelling of the face/tongue/throat, SOB, or low BP? No Did it involve sudden or severe rash/hives, skin peeling, or any reaction on the inside of your mouth or nose? No Did you need to seek medical attention at a hospital or doctor's office? No When did it last happen? as a child If all above answers are "NO", may proceed with cephalosporin use.     Medications: Outpatient Encounter Medications as of 01/22/2021  Medication Sig  . aspirin EC 81 MG EC tablet Take 1 tablet (81 mg total) by mouth daily.  . B Complex Vitamins (B COMPLEX 100 PO) Take 1 tablet by mouth daily.  (Patient not taking: Reported on 01/20/2021)  . calcium carbonate (TUMS - DOSED IN MG ELEMENTAL CALCIUM) 500 MG chewable tablet Chew 1 tablet by mouth daily.   . cholecalciferol (VITAMIN D3) 25 MCG (1000 UT) tablet Take 1,000 Units by mouth daily.  . famotidine (PEPCID) 20 MG tablet Take 20 mg by mouth at bedtime.  . ferrous sulfate 325 (65 FE) MG EC tablet Take 325 mg by mouth daily with breakfast. (Patient not taking: Reported on 01/20/2021)  . furosemide (LASIX) 40 MG tablet Take 1/2 (one-half) tablet by mouth once daily  . metoprolol tartrate (LOPRESSOR) 25 MG tablet Take 25 mg by mouth as needed.  . Multiple Vitamins-Minerals (PRESERVISION AREDS 2 PO) Take 1 capsule by mouth in the morning and at bedtime.  (Patient not taking: Reported on 01/20/2021)  . nadolol (CORGARD) 40 MG tablet Take 0.5  tablets (20 mg total) by mouth daily.  . potassium chloride 20 MEQ/15ML (10%) SOLN TAKE 15 MLS BY MOUTH ONCE DAILY  . TURMERIC PO Take 500 mg by mouth 3 (three) times a week.  . warfarin (COUMADIN) 2.5 MG tablet TAKE 1 TO 2 TABLETS BY MOUTH ONCE DAILY OR  INSTRUCTED  BY  COUMADIN  CLINIC   No facility-administered encounter medications on file as of 01/22/2021.    Current Diagnosis: Patient Active Problem List   Diagnosis Date Noted  . Palpitations 11/10/2019  . Pleural effusion, left   . Sinus node dysfunction (HCC)   . S/P minimally invasive tricuspid valve repair 06/12/2019  . S/P Maze operation for atrial fibrillation 06/12/2019  . 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 Bicuspid aortic valve 01/28/2019  . Tricuspid valve insufficiency   . Severe tricuspid regurgitation   . Long term (current) use of anticoagulants 08/28/2018  . Persistent atrial fibrillation ([place])   . DOE (dyspnea on exertion) 07/26/2018  . Osteopenia 03/13/2017  . Ocular migraine 03/13/2017  . OSA (obstructive sleep apnea) 12/21/2016  . IBS (irritable bowel syndrome) 09/12/2016  . History of adenomatous polyp of colon 09/12/2016  . Former smoker 09/12/2016  . Carpal tunnel syndrome 09/04/2013  . Lower extremity neuropathy 08/23/2013  . Achilles tendon tear 01/17/2011  . Hyperlipemia 01/07/2010  . GERD with stricture 06/19/2009  . ACNE ROSACEA 04/03/2008  . Essential tremor 11/16/2007  .  Essential hypertension 11/16/2007    Have you seen any other providers since your last visit with [name], Pharm.D., BCGP?  11/03/2020 [facility]atology, Harriett Sine, MD, 12/10/2020 [facility] Covid-19 Send Out Testing, [name], NP.  Have you had any problems recently with your health?  Patient states she recently started going to rehab for right sided sciatica pain.  Have you had any problems with your pharmacy?  Patient states she has not had any problems recently with her pharmacy.  What issues or  side effects are you having with your medications?  Patient states she is not having any side effects from her medications that she is aware of. Patient states her lips have been chapped, she's not sure if this has anything to do with her medications or not.  What would you like me to pass along to [name], [place].D., BCGP for them to help you with?   Patient states she does not have anything to pass along at this time.  What can we do to take care of you better?  Patient states "I think my medications are okay at this time".  April D Calhoun, [place] Pharmacist Assistant [phone]    Follow-Up:  Pharmacist Review

## 2021-01-27 ENCOUNTER — Ambulatory Visit: Payer: Medicare Other | Attending: Family Medicine | Admitting: Physical Therapy

## 2021-01-27 ENCOUNTER — Other Ambulatory Visit: Payer: Self-pay

## 2021-01-27 ENCOUNTER — Encounter: Payer: Self-pay | Admitting: Physical Therapy

## 2021-01-27 DIAGNOSIS — M62838 Other muscle spasm: Secondary | ICD-10-CM

## 2021-01-27 DIAGNOSIS — M5441 Lumbago with sciatica, right side: Secondary | ICD-10-CM | POA: Diagnosis not present

## 2021-01-27 DIAGNOSIS — M546 Pain in thoracic spine: Secondary | ICD-10-CM | POA: Insufficient documentation

## 2021-01-27 DIAGNOSIS — R293 Abnormal posture: Secondary | ICD-10-CM | POA: Diagnosis not present

## 2021-01-27 DIAGNOSIS — R252 Cramp and spasm: Secondary | ICD-10-CM | POA: Diagnosis not present

## 2021-01-27 NOTE — Therapy (Signed)
[facility] [address] [place], [place], [address] Phone: [phone]   Fax:  [phone]  Physical Therapy Treatment  Patient Details  Name: Adriana Hendricks MRN: [id number] Date of Birth: 1935/07/23 Referring Provider (PT): Garret Reddish, MD   Encounter Date: 01/27/2021   PT End of Session - 01/27/21 1018    Visit Number 3    Date for PT Re-Evaluation 03/17/21    Authorization Type MCR    PT Start Time 1018    PT Stop Time 1107    PT Time Calculation (min) 49 min    Activity Tolerance Patient tolerated treatment well;Patient limited by pain    Behavior During Therapy [facility] for tasks assessed/performed           Past Medical History:  Diagnosis Date  . Achilles tendon rupture   . Arthritis of hand, right   . Atrial flutter with rapid ventricular response ([place])   . Carpal tunnel syndrome 09/04/2013  . Collagenous colitis 2005  . Colon polyps 2010   Tubular adenoma and hyperplastic  . Dental infection 10/25/2014  . Diverticulosis   . Esophageal stenosis   . Essential tremor   . GERD (gastroesophageal reflux disease)    hx hiatal hernia, hx esophagitis, hx stricture  . Hiatal hernia   . Hypertension   . Lower extremity neuropathy 08/23/2013   On B12 therapy with numbness in the feet bilaterally no evidence of diabetes   . Ocular migraine    jagged vision, a few per month  . OSA (obstructive sleep apnea) 12/21/2016   on CPAP  . Peripheral neuropathy    treated by Dr Posey Pronto (07/2015)  . Persistent atrial fibrillation (HCC)    Rate control with beta-blocker and anticoagulated with warfarin  . Pleural effusion, left   . S/P Maze operation for atrial fibrillation 06/12/2019   Complete bilateral atrial lesion set using cryothermy and bipolar radiofrequency ablation with clipping of LA appendage via right mini thoracotomy approach  . S/P minimally invasive tricuspid valve repair 06/12/2019   Complex valvuloplasty  including autologous pericardial patch augmentation of anterior and posterior leaflets with 28 mm Edwards mc3 ring annuloplasty via right mini thoracotomy approach  . Severe tricuspid regurgitation 07/2018   Noted Aug 2019 during a fib workup. Severe LAE as well  . Sinus node dysfunction [facility])     Past Surgical History:  Procedure Laterality Date  . 48 hr Holter Monitor  07/2018   Persistent Afib (rate 33 - 124 bpm)  . APPENDECTOMY    . CARDIAC CATHETERIZATION    . CARDIOVERSION N/A 10/02/2018   Procedure: CARDIOVERSION;  Surgeon: Acie Fredrickson, Wonda Cheng, MD;  Location: [facility] ENDOSCOPY;  Service: Cardiovascular;  Laterality: N/A;  . CARPAL TUNNEL RELEASE    . CATARACT EXTRACTION    . EXCISION MORTON'S NEUROMA     Right foot  . EYE SURGERY     Cataracts with implants-bilateral  . INTRAOCULAR LENS IMPLANT, SECONDARY    . IR THORACENTESIS ASP PLEURAL SPACE W/IMG GUIDE  07/10/2019  . IR THORACENTESIS ASP PLEURAL SPACE W/IMG GUIDE  07/10/2019  . IR THORACENTESIS ASP PLEURAL SPACE W/IMG GUIDE  07/26/2019  . MINIMALLY INVASIVE MAZE PROCEDURE N/A 06/12/2019   Procedure: MINIMALLY INVASIVE MAZE PROCEDURE;  Surgeon: Rexene Alberts, MD;  Location: [place];  Service: Open Heart Surgery;  Laterality: N/A;  . MINIMALLY INVASIVE TRICUSPID VALVE REPAIR Right 06/12/2019   Procedure: MINIMALLY INVASIVE TRICUSPID VALVE REPAIR USING EDWARDS MC3 T28 ANNULOPLASTY RING,  INSERTION TEMPORARY TRANSVENOUS AV PACING LEAD;  Surgeon: Rexene Alberts, MD;  Location: [place];  Service: Open Heart Surgery;  Laterality: Right;  . Moles removed    . MOUTH SURGERY     (For Exostosis)  . NUCLEAR  07/2018   EF 71 %. LOW RISK - no ischemia or Infarct.   Marland Kitchen RIGHT/LEFT HEART CATH AND CORONARY ANGIOGRAPHY N/A 01/16/2019   Procedure: RIGHT/LEFT HEART CATH AND CORONARY ANGIOGRAPHY;  Surgeon: Leonie Man, MD;  Location: [place] CV LAB;; Angiographically normal coronary arteries.  Normal LVEDP. Upper Limit of Normal PAP~23 mmHg & PCWP  18 mmHg.  LVEDP of 7 mmHg. -With mean PAP of 23 mmHg there is a TPG suggesting a primary pulmonary etiology.  Marland Kitchen ROTATOR CUFF REPAIR    . TEE WITHOUT CARDIOVERSION N/A 01/16/2019   Procedure: TRANSESOPHAGEAL ECHOCARDIOGRAM (TEE);  Surgeon: Sueanne Margarita, MD;  Location: [facility] ENDOSCOPY;;  Severe TR due to poor coaptation of the leaflets from annular dilation.  Mild to moderate mitral vegetation with mildly restricted mobility of the posterior leaflet.  EF 55-60% with no R WMA.  Severe RA and LA dilation.  . TEE WITHOUT CARDIOVERSION N/A 06/12/2019   Procedure: TRANSESOPHAGEAL ECHOCARDIOGRAM (TEE);  Surgeon: Rexene Alberts, MD;  Location: [place];  Service: Open Heart Surgery;  Laterality: N/A;  . TONSILLECTOMY    . TRANSTHORACIC ECHOCARDIOGRAM  08/05/2019   First postop TAVR: EF 60 to 65%. Mod concentric LVH. Grade III DD (? w/ Mild LA dilation).  Rheumatic MV w/ mod thickening & Ca2+.  Anterior leaflet has mild doming but no MVP.  Mild-mod MR and mild to mod MS (mean MVA gradient 7 mmHg).  ~ functional bicuspid AoV w/ Mod Sclerosis - no AS.  Thoracic Aorta (4.1 & 3.7 @ Sinus of  V, prox Ascending) - mild dilation  . TRANSTHORACIC ECHOCARDIOGRAM  08/02/2018   Normal LV Fxn (EF 60-65%) no RWMA.  Severe LA dilation & Severe TR.    There were no vitals filed for this visit.   Subjective Assessment - 01/27/21 1019    Subjective I've been hurting ever since my last visit. Sitting is 7-8/10 constant.    Pertinent History HTN, RCR, peripheral neuropathy (feet), mitral valve replacement    Patient Stated Goals to get rid of your pain    Currently in Pain? Yes    Pain Score 4     Pain Location Hip    Pain Orientation Right    Pain Descriptors / Indicators Aching    Pain Type Acute pain                             [facility] Adult PT Treatment/Exercise - 01/27/21 0001      Exercises   Exercises Lumbar      Lumbar Exercises: Stretches   Standing Extension 10 reps    Standing Extension  Limitations no change in sx    Prone on Elbows Stretch Limitations unable to tolerate due to breathing    Other Lumbar Stretch Exercise modified pigeon to help with donning socks/shoes      Lumbar Exercises: Prone   Other Prone Lumbar Exercises pelvic press 5x 5 sec hold      Modalities   Modalities Electrical Stimulation;Moist Heat      Moist Heat Therapy   Number Minutes Moist Heat 15 Minutes    Moist Heat Location Hip      Electrical Stimulation  Electrical Stimulation Location right lumbar and gluteals    Electrical Stimulation Action IFC    Electrical Stimulation Parameters seated x 15 min to tolerance    Electrical Stimulation Goals Pain;Tone      Manual Therapy   Manual Therapy Soft tissue mobilization;Myofascial release    Manual therapy comments Skilled palpation and monitoring of soft tissues during DN    Joint Mobilization grade 2/3 UPA mobs to lumbar    Soft tissue mobilization to right gluteals    Myofascial Release TPR in hooklying to right hip flexor            Trigger Point Dry Needling - 01/27/21 0001    Consent Given? Yes    Education Handout Provided Previously provided    Muscles Treated Back/Hip Gluteus minimus;Gluteus medius;Gluteus maximus;Piriformis;Erector spinae;Quadratus lumborum    Other Dry Needling right    Gluteus Minimus Response Twitch response elicited;Palpable increased muscle length    Gluteus Medius Response Twitch response elicited;Palpable increased muscle length    Gluteus Maximus Response Palpable increased muscle length    Piriformis Response Twitch response elicited;Palpable increased muscle length    Erector spinae Response Palpable increased muscle length    Quadratus Lumborum Response Palpable increased muscle length                  PT Short Term Goals - 01/20/21 1312      PT SHORT TERM GOAL #1   Title be independent in initial HEP    Time 4    Period Weeks    Status New    Target Date 02/17/21      PT SHORT  TERM GOAL #2   Title report a 30% reduction in the frequency and intensity of LBP with ADLs and self-care    Baseline -    Time 4    Period Weeks    Status New             PT Long Term Goals - 01/20/21 1313      PT LONG TERM GOAL #1   Title be independent in advanced HEP    Time 8    Period Weeks    Status New    Target Date 03/17/21      PT LONG TERM GOAL #2   Title report decreased LBP by 75% or more and report no LE sx.    Time 8    Period Weeks    Status New      PT LONG TERM GOAL #3   Title Patient to demonstrate lumbar ext and rotation WFLs to perform ADLS including reaching OH    Time 8    Period Weeks    Status New      PT LONG TERM GOAL #4   Title Ind in the use of correct body mechanics for ADLS to prevent further injury    Time 8    Period Weeks    Status New      PT LONG TERM GOAL #5   Title FOTO FSPM >= 64    Baseline baseline 36    Time 8    Period Weeks    Status New                 Plan - 01/27/21 1207    Clinical Impression Statement Patient presents today with increased pain since last visit including with sitting. She reports that she has to put her socks and pants on the floor to don them and it  is very painful.  New exercises were exacerbating pain so she stopped doing them. She tolerated standing extension but no change in symptoms. She could not tolerate prone on elbows due to breathing limitations. She responded very well to DN in lumbar gluteals and to estim and was able to tolerate sitting at end of session. Modified pigeon on table was demo'd to pt with return demo for help with donning/doffing socks, shoes and pants.    Comorbidities HTN, RCR, peripheral neuropathy (feet), mitral valve replacement    PT Treatment/Interventions ADLs/Self Care Home Management;Aquatic Therapy;Cryotherapy;Electrical Stimulation;Moist Heat;Traction;Therapeutic exercise;Neuromuscular re-education;Patient/family education;Manual techniques;Dry  needling;Taping    PT Next Visit Plan DN as needed; manual therapy; Ease pt back into new TE/neural glide and and low level mobility ex's; ES/heat in sitting    PT Home Exercise Plan [id number]           Patient will benefit from skilled therapeutic intervention in order to improve the following deficits and impairments:  Abnormal gait,Decreased mobility,Increased muscle spasms,Pain,Decreased range of motion,Decreased activity tolerance,Decreased strength,Impaired flexibility,Postural dysfunction  Visit Diagnosis: Acute right-sided low back pain with right-sided sciatica  Other muscle spasm     Problem List Patient Active Problem List   Diagnosis Date Noted  . Palpitations 11/10/2019  . Pleural effusion, left   . Sinus node dysfunction (HCC)   . S/P minimally invasive tricuspid valve repair 06/12/2019  . S/P Maze operation for atrial fibrillation 06/12/2019  . 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 Bicuspid aortic valve 01/28/2019  . Tricuspid valve insufficiency   . Severe tricuspid regurgitation   . Long term (current) use of anticoagulants 08/28/2018  . Persistent atrial fibrillation ([place])   . DOE (dyspnea on exertion) 07/26/2018  . Osteopenia 03/13/2017  . Ocular migraine 03/13/2017  . OSA (obstructive sleep apnea) 12/21/2016  . IBS (irritable bowel syndrome) 09/12/2016  . History of adenomatous polyp of colon 09/12/2016  . Former smoker 09/12/2016  . Carpal tunnel syndrome 09/04/2013  . Lower extremity neuropathy 08/23/2013  . Achilles tendon tear 01/17/2011  . Hyperlipemia 01/07/2010  . GERD with stricture 06/19/2009  . ACNE ROSACEA 04/03/2008  . Essential tremor 11/16/2007  . Essential hypertension 11/16/2007    Madelyn Flavors PT 01/27/2021, 12:14 PM  [facility] [address], [place], [place], [address] Phone: [phone]   Fax:  [phone]  Name: LUCIELLA AUSTRIA MRN: [id number] Date of  Birth: [birth year]

## 2021-02-01 ENCOUNTER — Other Ambulatory Visit: Payer: Self-pay

## 2021-02-01 ENCOUNTER — Ambulatory Visit (INDEPENDENT_AMBULATORY_CARE_PROVIDER_SITE_OTHER): Payer: Medicare Other

## 2021-02-01 DIAGNOSIS — Z7901 Long term (current) use of anticoagulants: Secondary | ICD-10-CM | POA: Diagnosis not present

## 2021-02-01 DIAGNOSIS — I4819 Other persistent atrial fibrillation: Secondary | ICD-10-CM

## 2021-02-01 LAB — POCT INR: INR: 3.6 — AB (ref 2.0–3.0)

## 2021-02-01 NOTE — Patient Instructions (Signed)
Hold today only and then Continue with 1 tablet daily except 2 tablets each Monday, Wednesday and Friday.  Repeat INR in 4 weeks

## 2021-02-02 ENCOUNTER — Other Ambulatory Visit: Payer: Self-pay

## 2021-02-02 ENCOUNTER — Ambulatory Visit: Payer: Medicare Other | Admitting: Physical Therapy

## 2021-02-02 ENCOUNTER — Encounter: Payer: Self-pay | Admitting: Physical Therapy

## 2021-02-02 DIAGNOSIS — R252 Cramp and spasm: Secondary | ICD-10-CM

## 2021-02-02 DIAGNOSIS — R293 Abnormal posture: Secondary | ICD-10-CM | POA: Diagnosis not present

## 2021-02-02 DIAGNOSIS — M5441 Lumbago with sciatica, right side: Secondary | ICD-10-CM | POA: Diagnosis not present

## 2021-02-02 DIAGNOSIS — M546 Pain in thoracic spine: Secondary | ICD-10-CM | POA: Diagnosis not present

## 2021-02-02 DIAGNOSIS — M62838 Other muscle spasm: Secondary | ICD-10-CM | POA: Diagnosis not present

## 2021-02-02 NOTE — Patient Instructions (Signed)
Access Code: [email or web address] URL: [email or web address] Date: 02/02/2021 Prepared by: Almyra Free  Exercises Supine Lower Trunk Rotation - 1 x daily - 7 x weekly - 1 sets - 10 reps Bent Knee Fallouts - 1 x daily - 7 x weekly - 1 sets - 10 reps Supine Sciatic Nerve Glide - 1 x daily - 7 x weekly - 1 sets - 10 reps - 5 sec hold Seated Table Piriformis Stretch - 2 x daily - 7 x weekly - 3 reps - 1 sets - 30-60 sec hold Supine Piriformis Stretch with Leg Straight - 2 x daily - 7 x weekly - 3 reps - 1 sets - 30 sec hold Supine Quadriceps Stretch with Strap on Table - 2 x daily - 7 x weekly - 1 sets - 3 reps - 30-60 sec hold Beginner Prone Single Leg Raise - 1 x daily - 7 x weekly - 1 sets - 10 reps

## 2021-02-02 NOTE — Therapy (Signed)
[facility] [address], [place], [place], [address] Phone: [phone]   Fax:  [phone]  Physical Therapy Treatment  Patient Details  Name: Adriana Hendricks MRN: [id number] Date of Birth: [birth year] Referring Provider (PT): Garret Reddish, MD   Encounter Date: 02/02/2021   PT End of Session - 02/02/21 1016    Visit Number 4    Date for PT Re-Evaluation 03/17/21    Authorization Type MCR    Progress Note Due on Visit 10    PT Start Time 1016    PT Stop Time 1107    PT Time Calculation (min) 51 min    Activity Tolerance Patient tolerated treatment well;Patient limited by pain    Behavior During Therapy [facility] for tasks assessed/performed           Past Medical History:  Diagnosis Date  . Achilles tendon rupture   . Arthritis of hand, right   . Atrial flutter with rapid ventricular response ([place])   . Carpal tunnel syndrome 09/04/2013  . Collagenous colitis 2005  . Colon polyps 2010   Tubular adenoma and hyperplastic  . Dental infection 10/25/2014  . Diverticulosis   . Esophageal stenosis   . Essential tremor   . GERD (gastroesophageal reflux disease)    hx hiatal hernia, hx esophagitis, hx stricture  . Hiatal hernia   . Hypertension   . Lower extremity neuropathy 08/23/2013   On B12 therapy with numbness in the feet bilaterally no evidence of diabetes   . Ocular migraine    jagged vision, a few per month  . OSA (obstructive sleep apnea) 12/21/2016   on CPAP  . Peripheral neuropathy    treated by Dr Posey Pronto (07/2015)  . Persistent atrial fibrillation (HCC)    Rate control with beta-blocker and anticoagulated with warfarin  . Pleural effusion, left   . S/P Maze operation for atrial fibrillation 06/12/2019   Complete bilateral atrial lesion set using cryothermy and bipolar radiofrequency ablation with clipping of LA appendage via right mini thoracotomy approach  . S/P minimally invasive tricuspid valve repair  06/12/2019   Complex valvuloplasty including autologous pericardial patch augmentation of anterior and posterior leaflets with 28 mm Edwards mc3 ring annuloplasty via right mini thoracotomy approach  . Severe tricuspid regurgitation 07/2018   Noted Aug 2019 during a fib workup. Severe LAE as well  . Sinus node dysfunction [facility])     Past Surgical History:  Procedure Laterality Date  . 48 hr Holter Monitor  07/2018   Persistent Afib (rate 33 - 124 bpm)  . APPENDECTOMY    . CARDIAC CATHETERIZATION    . CARDIOVERSION N/A 10/02/2018   Procedure: CARDIOVERSION;  Surgeon: Acie Fredrickson, Wonda Cheng, MD;  Location: [facility] ENDOSCOPY;  Service: Cardiovascular;  Laterality: N/A;  . CARPAL TUNNEL RELEASE    . CATARACT EXTRACTION    . EXCISION MORTON'S NEUROMA     Right foot  . EYE SURGERY     Cataracts with implants-bilateral  . INTRAOCULAR LENS IMPLANT, SECONDARY    . IR THORACENTESIS ASP PLEURAL SPACE W/IMG GUIDE  07/10/2019  . IR THORACENTESIS ASP PLEURAL SPACE W/IMG GUIDE  07/10/2019  . IR THORACENTESIS ASP PLEURAL SPACE W/IMG GUIDE  07/26/2019  . MINIMALLY INVASIVE MAZE PROCEDURE N/A 06/12/2019   Procedure: MINIMALLY INVASIVE MAZE PROCEDURE;  Surgeon: Rexene Alberts, MD;  Location: [place];  Service: Open Heart Surgery;  Laterality: N/A;  . MINIMALLY INVASIVE TRICUSPID VALVE REPAIR Right 06/12/2019   Procedure: MINIMALLY INVASIVE  TRICUSPID VALVE REPAIR USING EDWARDS MC3 T28 ANNULOPLASTY RING, INSERTION TEMPORARY TRANSVENOUS AV PACING LEAD;  Surgeon: Rexene Alberts, MD;  Location: [place];  Service: Open Heart Surgery;  Laterality: Right;  . Moles removed    . MOUTH SURGERY     (For Exostosis)  . NUCLEAR  07/2018   EF 71 %. LOW RISK - no ischemia or Infarct.   Marland Kitchen RIGHT/LEFT HEART CATH AND CORONARY ANGIOGRAPHY N/A 01/16/2019   Procedure: RIGHT/LEFT HEART CATH AND CORONARY ANGIOGRAPHY;  Surgeon: Leonie Man, MD;  Location: [place] CV LAB;; Angiographically normal coronary arteries.  Normal LVEDP. Upper  Limit of Normal PAP~23 mmHg & PCWP 18 mmHg.  LVEDP of 7 mmHg. -With mean PAP of 23 mmHg there is a TPG suggesting a primary pulmonary etiology.  Marland Kitchen ROTATOR CUFF REPAIR    . TEE WITHOUT CARDIOVERSION N/A 01/16/2019   Procedure: TRANSESOPHAGEAL ECHOCARDIOGRAM (TEE);  Surgeon: Sueanne Margarita, MD;  Location: [facility] ENDOSCOPY;;  Severe TR due to poor coaptation of the leaflets from annular dilation.  Mild to moderate mitral vegetation with mildly restricted mobility of the posterior leaflet.  EF 55-60% with no R WMA.  Severe RA and LA dilation.  . TEE WITHOUT CARDIOVERSION N/A 06/12/2019   Procedure: TRANSESOPHAGEAL ECHOCARDIOGRAM (TEE);  Surgeon: Rexene Alberts, MD;  Location: [place];  Service: Open Heart Surgery;  Laterality: N/A;  . TONSILLECTOMY    . TRANSTHORACIC ECHOCARDIOGRAM  08/05/2019   First postop TAVR: EF 60 to 65%. Mod concentric LVH. Grade III DD (? w/ Mild LA dilation).  Rheumatic MV w/ mod thickening & Ca2+.  Anterior leaflet has mild doming but no MVP.  Mild-mod MR and mild to mod MS (mean MVA gradient 7 mmHg).  ~ functional bicuspid AoV w/ Mod Sclerosis - no AS.  Thoracic Aorta (4.1 & 3.7 @ Sinus of  V, prox Ascending) - mild dilation  . TRANSTHORACIC ECHOCARDIOGRAM  08/02/2018   Normal LV Fxn (EF 60-65%) no RWMA.  Severe LA dilation & Severe TR.    There were no vitals filed for this visit.   Subjective Assessment - 02/02/21 1017    Subjective I'm doing much better. Just tight in my right hip    Pertinent History HTN, RCR, peripheral neuropathy (feet), mitral valve replacement    Patient Stated Goals to get rid of your pain    Currently in Pain? No/denies                             [facility] Adult PT Treatment/Exercise - 02/02/21 0001      Lumbar Exercises: Stretches   Standing Extension 2 reps    Pension scheme manager reps;30 seconds    Quad Stretch Limitations supine with strap; also tried standing with knee flexed on chair but no stretch felt    ITB Stretch  Right;1 rep;10 seconds    ITB Stretch Limitations SDLY but no tightness felt    Piriformis Stretch Right;2 reps;30 seconds    Piriformis Stretch Limitations knee across body    Other Lumbar Stretch Exercise mod pigeon right 2 x 30 sec      Lumbar Exercises: Seated   Sit to Stand 20 reps    Sit to Stand Limitations 2nd set with 5# kettle bell      Lumbar Exercises: Supine   Other Supine Lumbar Exercises nerve glide in HS stretch position 5sec on/off x 10      Lumbar Exercises: Prone  Straight Leg Raise 5 reps    Straight Leg Raises Limitations more difficult on left side    Other Prone Lumbar Exercises pelvic press 5x 5 sec hold      Modalities   Modalities Moist Heat      Moist Heat Therapy   Number Minutes Moist Heat 10 Minutes    Moist Heat Location Hip      Manual Therapy   Manual Therapy Soft tissue mobilization    Manual therapy comments Skilled palpation and monitoring of soft tissues during DN    Soft tissue mobilization to right gluteals and piriformis            Trigger Point Dry Needling - 02/02/21 0001    Consent Given? Yes    Education Handout Provided Previously provided    Muscles Treated Back/Hip Gluteus minimus;Gluteus medius;Piriformis    Dry Needling Comments right    Gluteus Minimus Response Twitch response elicited;Palpable increased muscle length    Gluteus Medius Response Twitch response elicited;Palpable increased muscle length    Piriformis Response Twitch response elicited;Palpable increased muscle length                PT Education - 02/02/21 1101    Education Details HEP    Person(s) Educated Patient    Methods Explanation;Demonstration;Handout    Comprehension Verbalized understanding;Returned demonstration            PT Short Term Goals - 02/02/21 1215      PT SHORT TERM GOAL #1   Title be independent in initial HEP    Status Partially Met      PT SHORT TERM GOAL #2   Title report a 30% reduction in the frequency and  intensity of LBP with ADLs and self-care    Status On-going             PT Long Term Goals - 01/20/21 1313      PT LONG TERM GOAL #1   Title be independent in advanced HEP    Time 8    Period Weeks    Status New    Target Date 03/17/21      PT LONG TERM GOAL #2   Title report decreased LBP by 75% or more and report no LE sx.    Time 8    Period Weeks    Status New      PT LONG TERM GOAL #3   Title Patient to demonstrate lumbar ext and rotation WFLs to perform ADLS including reaching OH    Time 8    Period Weeks    Status New      PT LONG TERM GOAL #4   Title Ind in the use of correct body mechanics for ADLS to prevent further injury    Time 8    Period Weeks    Status New      PT LONG TERM GOAL #5   Title FOTO FSPM >= 64    Baseline baseline 36    Time 8    Period Weeks    Status New                 Plan - 02/02/21 1211    Clinical Impression Statement Patient presents today without pain. She is still tight in the right piriformis and sciatic nerve glide is still positive. Patient did well with prone stabilization exericses today. HEP was progressed. Patient had significantly less trigger points and tension in right gluteals and lumbar today. Good response  to DN.    PT Frequency 2x / week    PT Duration 8 weeks    PT Treatment/Interventions ADLs/Self Care Home Management;Aquatic Therapy;Cryotherapy;Electrical Stimulation;Moist Heat;Traction;Therapeutic exercise;Neuromuscular re-education;Patient/family education;Manual techniques;Dry needling;Taping    PT Next Visit Plan DN as needed; manual therapy; continue lumbar stab    PT Home Exercise Plan [email or web address]           Patient will benefit from skilled therapeutic intervention in order to improve the following deficits and impairments:  Abnormal gait,Decreased mobility,Increased muscle spasms,Pain,Decreased range of motion,Decreased activity tolerance,Decreased strength,Impaired flexibility,Postural  dysfunction  Visit Diagnosis: Acute right-sided low back pain with right-sided sciatica  Other muscle spasm  Pain in thoracic spine  Abnormal posture  Cramp and spasm     Problem List Patient Active Problem List   Diagnosis Date Noted  . Palpitations 11/10/2019  . Pleural effusion, left   . Sinus node dysfunction (HCC)   . S/P minimally invasive tricuspid valve repair 06/12/2019  . S/P Maze operation for atrial fibrillation 06/12/2019  . 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 Bicuspid aortic valve 01/28/2019  . Tricuspid valve insufficiency   . Severe tricuspid regurgitation   . Long term (current) use of anticoagulants 08/28/2018  . Persistent atrial fibrillation ([place])   . DOE (dyspnea on exertion) 07/26/2018  . Osteopenia 03/13/2017  . Ocular migraine 03/13/2017  . OSA (obstructive sleep apnea) 12/21/2016  . IBS (irritable bowel syndrome) 09/12/2016  . History of adenomatous polyp of colon 09/12/2016  . Former smoker 09/12/2016  . Carpal tunnel syndrome 09/04/2013  . Lower extremity neuropathy 08/23/2013  . Achilles tendon tear 01/17/2011  . Hyperlipemia 01/07/2010  . GERD with stricture 06/19/2009  . ACNE ROSACEA 04/03/2008  . Essential tremor 11/16/2007  . Essential hypertension 11/16/2007   Madelyn Flavors PT 02/02/2021, 12:19 PM  [facility] [address] [place], [place], [address] Phone: [phone]   Fax:  [phone]  Name: Adriana Hendricks MRN: [id number] Date of Birth: 1935/06/17

## 2021-02-05 ENCOUNTER — Ambulatory Visit: Payer: Medicare Other | Admitting: Physical Therapy

## 2021-02-09 ENCOUNTER — Ambulatory Visit: Payer: Medicare Other | Admitting: Physical Therapy

## 2021-02-09 ENCOUNTER — Encounter: Payer: Self-pay | Admitting: Physical Therapy

## 2021-02-09 ENCOUNTER — Other Ambulatory Visit: Payer: Self-pay

## 2021-02-09 DIAGNOSIS — M5441 Lumbago with sciatica, right side: Secondary | ICD-10-CM

## 2021-02-09 DIAGNOSIS — M62838 Other muscle spasm: Secondary | ICD-10-CM | POA: Diagnosis not present

## 2021-02-09 DIAGNOSIS — R252 Cramp and spasm: Secondary | ICD-10-CM | POA: Diagnosis not present

## 2021-02-09 DIAGNOSIS — R293 Abnormal posture: Secondary | ICD-10-CM | POA: Diagnosis not present

## 2021-02-09 DIAGNOSIS — M546 Pain in thoracic spine: Secondary | ICD-10-CM | POA: Diagnosis not present

## 2021-02-09 NOTE — Patient Instructions (Signed)
Access Code: [email or web address]: 02/15/2022Prepared by: [facility]  Supine Lower Trunk Rotation - 1 x daily - 7 x weekly - 1 sets - 10 reps  Bent Knee Fallouts - 1 x daily - 7 x weekly - 1 sets - 10 reps  Supine Sciatic Nerve Glide - 1 x daily - 7 x weekly - 1 sets - 10 reps - 5 sec hold  Seated Table Piriformis Stretch - 2 x daily - 7 x weekly - 3 reps - 1 sets - 30-60 sec hold  Supine Piriformis Stretch with Leg Straight - 2 x daily - 7 x weekly - 3 reps - 1 sets - 30 sec hold  Supine Quadriceps Stretch with Strap on Table - 2 x daily - 7 x weekly - 1 sets - 3 reps - 30-60 sec hold  Hooklying Gluteal Sets - 1 x daily - 7 x weekly - 2 sets - 10 reps - 5 seconds hold   [facility] [address], [place], [place] [address] Phone # [phone] Fax [phone]

## 2021-02-09 NOTE — Therapy (Signed)
[facility] [address] [place], [place], [address] Phone: [phone]   Fax:  [phone]  Physical Therapy Treatment  Patient Details  Name: Adriana Hendricks MRN: [id number] Date of Birth: Jan 21, 1935 Referring Provider (PT): Tana Conch, MD   Encounter Date: 02/09/2021   PT End of Session - 02/09/21 2006    Visit Number 5    Date for PT Re-Evaluation 03/17/21    Authorization Type MCR    Progress Note Due on Visit 10    PT Start Time 1015    PT Stop Time 1100    PT Time Calculation (min) 45 min    Activity Tolerance Patient tolerated treatment well;No increased pain    Behavior During Therapy WFL for tasks assessed/performed           Past Medical History:  Diagnosis Date  . Achilles tendon rupture   . Arthritis of hand, right   . Atrial flutter with rapid ventricular response (HCC)   . Carpal tunnel syndrome 09/04/2013  . Collagenous colitis 2005  . Colon polyps 2010   Tubular adenoma and hyperplastic  . Dental infection 10/25/2014  . Diverticulosis   . Esophageal stenosis   . Essential tremor   . GERD (gastroesophageal reflux disease)    hx hiatal hernia, hx esophagitis, hx stricture  . Hiatal hernia   . Hypertension   . Lower extremity neuropathy 08/23/2013   On B12 therapy with numbness in the feet bilaterally no evidence of diabetes   . Ocular migraine    jagged vision, a few per month  . OSA (obstructive sleep apnea) 12/21/2016   on CPAP  . Peripheral neuropathy    treated by Dr Allena Katz (07/2015)  . Persistent atrial fibrillation (HCC)    Rate control with beta-blocker and anticoagulated with warfarin  . Pleural effusion, left   . S/P Maze operation for atrial fibrillation 06/12/2019   Complete bilateral atrial lesion set using cryothermy and bipolar radiofrequency ablation with clipping of LA appendage via right mini thoracotomy approach  . S/P minimally invasive tricuspid valve repair  06/12/2019   Complex valvuloplasty including autologous pericardial patch augmentation of anterior and posterior leaflets with 28 mm Edwards mc3 ring annuloplasty via right mini thoracotomy approach  . Severe tricuspid regurgitation 07/2018   Noted Aug 2019 during a fib workup. Severe LAE as well  . Sinus node dysfunction [facility])     Past Surgical History:  Procedure Laterality Date  . 48 hr Holter Monitor  07/2018   Persistent Afib (rate 33 - 124 bpm)  . APPENDECTOMY    . CARDIAC CATHETERIZATION    . CARDIOVERSION N/A 10/02/2018   Procedure: CARDIOVERSION;  Surgeon: Elease Hashimoto, Deloris Ping, MD;  Location: [facility] ENDOSCOPY;  Service: Cardiovascular;  Laterality: N/A;  . CARPAL TUNNEL RELEASE    . CATARACT EXTRACTION    . EXCISION MORTON'S NEUROMA     Right foot  . EYE SURGERY     Cataracts with implants-bilateral  . INTRAOCULAR LENS IMPLANT, SECONDARY    . IR THORACENTESIS ASP PLEURAL SPACE W/IMG GUIDE  07/10/2019  . IR THORACENTESIS ASP PLEURAL SPACE W/IMG GUIDE  07/10/2019  . IR THORACENTESIS ASP PLEURAL SPACE W/IMG GUIDE  07/26/2019  . MINIMALLY INVASIVE MAZE PROCEDURE N/A 06/12/2019   Procedure: MINIMALLY INVASIVE MAZE PROCEDURE;  Surgeon: Purcell Nails, MD;  Location: [facility] OR;  Service: Open Heart Surgery;  Laterality: N/A;  . MINIMALLY INVASIVE TRICUSPID VALVE REPAIR Right 06/12/2019   Procedure: MINIMALLY INVASIVE TRICUSPID  VALVE REPAIR USING EDWARDS MC3 T28 ANNULOPLASTY RING, INSERTION TEMPORARY TRANSVENOUS AV PACING LEAD;  Surgeon: Rexene Alberts, MD;  Location: [place];  Service: Open Heart Surgery;  Laterality: Right;  . Moles removed    . MOUTH SURGERY     (For Exostosis)  . NUCLEAR  07/2018   EF 71 %. LOW RISK - no ischemia or Infarct.   Marland Kitchen RIGHT/LEFT HEART CATH AND CORONARY ANGIOGRAPHY N/A 01/16/2019   Procedure: RIGHT/LEFT HEART CATH AND CORONARY ANGIOGRAPHY;  Surgeon: Leonie Man, MD;  Location: [place] CV LAB;; Angiographically normal coronary arteries.  Normal LVEDP. Upper  Limit of Normal PAP~23 mmHg & PCWP 18 mmHg.  LVEDP of 7 mmHg. -With mean PAP of 23 mmHg there is a TPG suggesting a primary pulmonary etiology.  Marland Kitchen ROTATOR CUFF REPAIR    . TEE WITHOUT CARDIOVERSION N/A 01/16/2019   Procedure: TRANSESOPHAGEAL ECHOCARDIOGRAM (TEE);  Surgeon: Sueanne Margarita, MD;  Location: [facility] ENDOSCOPY;;  Severe TR due to poor coaptation of the leaflets from annular dilation.  Mild to moderate mitral vegetation with mildly restricted mobility of the posterior leaflet.  EF 55-60% with no R WMA.  Severe RA and LA dilation.  . TEE WITHOUT CARDIOVERSION N/A 06/12/2019   Procedure: TRANSESOPHAGEAL ECHOCARDIOGRAM (TEE);  Surgeon: Rexene Alberts, MD;  Location: [place];  Service: Open Heart Surgery;  Laterality: N/A;  . TONSILLECTOMY    . TRANSTHORACIC ECHOCARDIOGRAM  08/05/2019   First postop TAVR: EF 60 to 65%. Mod concentric LVH. Grade III DD (? w/ Mild LA dilation).  Rheumatic MV w/ mod thickening & Ca2+.  Anterior leaflet has mild doming but no MVP.  Mild-mod MR and mild to mod MS (mean MVA gradient 7 mmHg).  ~ functional bicuspid AoV w/ Mod Sclerosis - no AS.  Thoracic Aorta (4.1 & 3.7 @ Sinus of  V, prox Ascending) - mild dilation  . TRANSTHORACIC ECHOCARDIOGRAM  08/02/2018   Normal LV Fxn (EF 60-65%) no RWMA.  Severe LA dilation & Severe TR.    There were no vitals filed for this visit.   Subjective Assessment - 02/09/21 1019    Subjective Pt states that her pain was bad yesterday following a day of slouching and going through papers. She slept with her heating pad and this morning her pain has dropped.    Pertinent History HTN, RCR, peripheral neuropathy (feet), mitral valve replacement    Patient Stated Goals to get rid of your pain    Currently in Pain? Yes    Pain Score 2     Pain Location Hip    Pain Orientation Right;Lateral    Aggravating Factors  walking, bending forward for long periods of time                             [facility]  Treatment/Exercise - 02/09/21 0001      Lumbar Exercises: Seated   Other Seated Lumbar Exercises proximal sciatic nerve flossing Rt x10 reps      Lumbar Exercises: Supine   Bent Knee Raise 10 reps    Bent Knee Raise Limitations deep abdominal activation and verbal cues to maintain pelvis contact with the table    Bridge 10 reps    Bridge Limitations isometric only    Isometric Hip Flexion 10 reps;3 seconds    Other Supine Lumbar Exercises Rt sciatic nerve flossing x10 reps in 90/90 position      Manual Therapy  Manual therapy comments Skilled palpation and monitoring of soft tissues during DN    Soft tissue mobilization STM Rt gluteals, Rt lateral quads/hamstrings with rolling stick            Trigger Point Dry Needling - 02/09/21 0001    Consent Given? Yes    Education Handout Provided Previously provided    Dry Needling Comments right    Gluteus Minimus Response Twitch response elicited;Palpable increased muscle length    Gluteus Medius Response Palpable increased muscle length;Twitch response elicited    Gluteus Maximus Response Twitch response elicited;Palpable increased muscle length                PT Education - 02/09/21 2005    Education Details modifications to HEP    Person(s) Educated Patient    Methods Explanation;Verbal cues;Handout    Comprehension Verbalized understanding;Returned demonstration            PT Short Term Goals - 02/02/21 1215      PT SHORT TERM GOAL #1   Title be independent in initial HEP    Status Partially Met      PT SHORT TERM GOAL #2   Title report a 30% reduction in the frequency and intensity of LBP with ADLs and self-care    Status On-going             PT Long Term Goals - 01/20/21 1313      PT LONG TERM GOAL #1   Title be independent in advanced HEP    Time 8    Period Weeks    Status New    Target Date 03/17/21      PT LONG TERM GOAL #2   Title report decreased LBP by 75% or more and report no LE sx.     Time 8    Period Weeks    Status New      PT LONG TERM GOAL #3   Title Patient to demonstrate lumbar ext and rotation WFLs to perform ADLS including reaching OH    Time 8    Period Weeks    Status New      PT LONG TERM GOAL #4   Title Ind in the use of correct body mechanics for ADLS to prevent further injury    Time 8    Period Weeks    Status New      PT LONG TERM GOAL #5   Title FOTO FSPM >= 64    Baseline baseline 36    Time 8    Period Weeks    Status New                 Plan - 02/09/21 2007    Clinical Impression Statement Pt's pain was improved following her last session, but it was exacerbated spending the day doing paperwork in a slouched forward position. Pt feels that she is improving following her treatment sessions, but is having difficulty maintaining her pain improvements when completing various activities at home. PT reviewed 2 of her current exercises and made some adjustments to avoid increase in Rt LE pain. Pt was able to maintain deep abdominal activation during hooklying marches. Ended sessions with dry needling and other soft tissue mobilization techniques to decrease myofascial tension on the Rt gluteal/lateral thigh region. Pt denied increase in pain following today's session.    Comorbidities HTN, RCR, peripheral neuropathy (feet), mitral valve replacement    PT Treatment/Interventions ADLs/Self Care Home Management;Aquatic Therapy;Cryotherapy;Electrical Stimulation;Moist Heat;Traction;Therapeutic exercise;Neuromuscular  re-education;Patient/family education;Manual techniques;Dry needling;Taping    PT Next Visit Plan Pt has 2 more appointments-need to consider prepping for d/c or encourage continuing PT at a location closer to her new home; DN as needed; manual therapy; Ease pt back into new TE/neural glide and and low level mobility ex's; ES/heat in sitting    PT Home Exercise Plan [email or web address]           Patient will benefit from skilled therapeutic  intervention in order to improve the following deficits and impairments:  Abnormal gait,Decreased mobility,Increased muscle spasms,Pain,Decreased range of motion,Decreased activity tolerance,Decreased strength,Impaired flexibility,Postural dysfunction  Visit Diagnosis: Acute right-sided low back pain with right-sided sciatica  Other muscle spasm     Problem List Patient Active Problem List   Diagnosis Date Noted  . Palpitations 11/10/2019  . Pleural effusion, left   . Sinus node dysfunction (HCC)   . S/P minimally invasive tricuspid valve repair 06/12/2019  . S/P Maze operation for atrial fibrillation 06/12/2019  . 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 Bicuspid aortic valve 01/28/2019  . Tricuspid valve insufficiency   . Severe tricuspid regurgitation   . Long term (current) use of anticoagulants 08/28/2018  . Persistent atrial fibrillation ([place])   . DOE (dyspnea on exertion) 07/26/2018  . Osteopenia 03/13/2017  . Ocular migraine 03/13/2017  . OSA (obstructive sleep apnea) 12/21/2016  . IBS (irritable bowel syndrome) 09/12/2016  . History of adenomatous polyp of colon 09/12/2016  . Former smoker 09/12/2016  . Carpal tunnel syndrome 09/04/2013  . Lower extremity neuropathy 08/23/2013  . Achilles tendon tear 01/17/2011  . Hyperlipemia 01/07/2010  . GERD with stricture 06/19/2009  . ACNE ROSACEA 04/03/2008  . Essential tremor 11/16/2007  . Essential hypertension 11/16/2007   8:16 PM,02/09/21 Sherol Dade PT, DPT [facility] [address] [place], [place], [address] Phone: [phone]   Fax:  [phone]  Name: Adriana Hendricks MRN: [id number] Date of Birth: 07-20-1935

## 2021-02-11 ENCOUNTER — Other Ambulatory Visit: Payer: Self-pay | Admitting: Cardiology

## 2021-02-12 ENCOUNTER — Ambulatory Visit: Payer: Medicare Other | Admitting: Physical Therapy

## 2021-02-16 ENCOUNTER — Encounter: Payer: Self-pay | Admitting: Physical Therapy

## 2021-02-16 ENCOUNTER — Ambulatory Visit: Payer: Medicare Other | Admitting: Physical Therapy

## 2021-02-16 ENCOUNTER — Other Ambulatory Visit: Payer: Self-pay

## 2021-02-16 DIAGNOSIS — M62838 Other muscle spasm: Secondary | ICD-10-CM | POA: Diagnosis not present

## 2021-02-16 DIAGNOSIS — R293 Abnormal posture: Secondary | ICD-10-CM | POA: Diagnosis not present

## 2021-02-16 DIAGNOSIS — R252 Cramp and spasm: Secondary | ICD-10-CM | POA: Diagnosis not present

## 2021-02-16 DIAGNOSIS — M546 Pain in thoracic spine: Secondary | ICD-10-CM | POA: Diagnosis not present

## 2021-02-16 DIAGNOSIS — M5441 Lumbago with sciatica, right side: Secondary | ICD-10-CM | POA: Diagnosis not present

## 2021-02-16 NOTE — Therapy (Signed)
[facility] [address] [place], [place], [address] Phone: [phone]   Fax:  [phone]  Physical Therapy Treatment/Discharge  Patient Details  Name: Adriana Hendricks MRN: [id number] Date of Birth: 1935-04-10 Referring Provider (PT): Garret Reddish, MD   Encounter Date: 02/16/2021   PT End of Session - 02/16/21 2041    Visit Number 6    Date for PT Re-Evaluation 03/17/21    Authorization Type MCR    Progress Note Due on Visit 10    PT Start Time 1016    PT Stop Time 1100    PT Time Calculation (min) 44 min    Activity Tolerance Patient tolerated treatment well;No increased pain    Behavior During Therapy WFL for tasks assessed/performed           Past Medical History:  Diagnosis Date  . Achilles tendon rupture   . Arthritis of hand, right   . Atrial flutter with rapid ventricular response ([place])   . Carpal tunnel syndrome 09/04/2013  . Collagenous colitis 2005  . Colon polyps 2010   Tubular adenoma and hyperplastic  . Dental infection 10/25/2014  . Diverticulosis   . Esophageal stenosis   . Essential tremor   . GERD (gastroesophageal reflux disease)    hx hiatal hernia, hx esophagitis, hx stricture  . Hiatal hernia   . Hypertension   . Lower extremity neuropathy 08/23/2013   On B12 therapy with numbness in the feet bilaterally no evidence of diabetes   . Ocular migraine    jagged vision, a few per month  . OSA (obstructive sleep apnea) 12/21/2016   on CPAP  . Peripheral neuropathy    treated by Dr Posey Pronto (07/2015)  . Persistent atrial fibrillation (HCC)    Rate control with beta-blocker and anticoagulated with warfarin  . Pleural effusion, left   . S/P Maze operation for atrial fibrillation 06/12/2019   Complete bilateral atrial lesion set using cryothermy and bipolar radiofrequency ablation with clipping of LA appendage via right mini thoracotomy approach  . S/P minimally invasive tricuspid valve  repair 06/12/2019   Complex valvuloplasty including autologous pericardial patch augmentation of anterior and posterior leaflets with 28 mm Edwards mc3 ring annuloplasty via right mini thoracotomy approach  . Severe tricuspid regurgitation 07/2018   Noted Aug 2019 during a fib workup. Severe LAE as well  . Sinus node dysfunction [facility])     Past Surgical History:  Procedure Laterality Date  . 48 hr Holter Monitor  07/2018   Persistent Afib (rate 33 - 124 bpm)  . APPENDECTOMY    . CARDIAC CATHETERIZATION    . CARDIOVERSION N/A 10/02/2018   Procedure: CARDIOVERSION;  Surgeon: Acie Fredrickson, Wonda Cheng, MD;  Location: [facility] ENDOSCOPY;  Service: Cardiovascular;  Laterality: N/A;  . CARPAL TUNNEL RELEASE    . CATARACT EXTRACTION    . EXCISION MORTON'S NEUROMA     Right foot  . EYE SURGERY     Cataracts with implants-bilateral  . INTRAOCULAR LENS IMPLANT, SECONDARY    . IR THORACENTESIS ASP PLEURAL SPACE W/IMG GUIDE  07/10/2019  . IR THORACENTESIS ASP PLEURAL SPACE W/IMG GUIDE  07/10/2019  . IR THORACENTESIS ASP PLEURAL SPACE W/IMG GUIDE  07/26/2019  . MINIMALLY INVASIVE MAZE PROCEDURE N/A 06/12/2019   Procedure: MINIMALLY INVASIVE MAZE PROCEDURE;  Surgeon: Rexene Alberts, MD;  Location: [place];  Service: Open Heart Surgery;  Laterality: N/A;  . MINIMALLY INVASIVE TRICUSPID VALVE REPAIR Right 06/12/2019   Procedure: MINIMALLY INVASIVE TRICUSPID  VALVE REPAIR USING EDWARDS MC3 T28 ANNULOPLASTY RING, INSERTION TEMPORARY TRANSVENOUS AV PACING LEAD;  Surgeon: Rexene Alberts, MD;  Location: [place];  Service: Open Heart Surgery;  Laterality: Right;  . Moles removed    . MOUTH SURGERY     (For Exostosis)  . NUCLEAR  07/2018   EF 71 %. LOW RISK - no ischemia or Infarct.   Marland Kitchen RIGHT/LEFT HEART CATH AND CORONARY ANGIOGRAPHY N/A 01/16/2019   Procedure: RIGHT/LEFT HEART CATH AND CORONARY ANGIOGRAPHY;  Surgeon: Leonie Man, MD;  Location: [place] CV LAB;; Angiographically normal coronary arteries.  Normal LVEDP.  Upper Limit of Normal PAP~23 mmHg & PCWP 18 mmHg.  LVEDP of 7 mmHg. -With mean PAP of 23 mmHg there is a TPG suggesting a primary pulmonary etiology.  Marland Kitchen ROTATOR CUFF REPAIR    . TEE WITHOUT CARDIOVERSION N/A 01/16/2019   Procedure: TRANSESOPHAGEAL ECHOCARDIOGRAM (TEE);  Surgeon: Sueanne Margarita, MD;  Location: [facility] ENDOSCOPY;;  Severe TR due to poor coaptation of the leaflets from annular dilation.  Mild to moderate mitral vegetation with mildly restricted mobility of the posterior leaflet.  EF 55-60% with no R WMA.  Severe RA and LA dilation.  . TEE WITHOUT CARDIOVERSION N/A 06/12/2019   Procedure: TRANSESOPHAGEAL ECHOCARDIOGRAM (TEE);  Surgeon: Rexene Alberts, MD;  Location: [place];  Service: Open Heart Surgery;  Laterality: N/A;  . TONSILLECTOMY    . TRANSTHORACIC ECHOCARDIOGRAM  08/05/2019   First postop TAVR: EF 60 to 65%. Mod concentric LVH. Grade III DD (? w/ Mild LA dilation).  Rheumatic MV w/ mod thickening & Ca2+.  Anterior leaflet has mild doming but no MVP.  Mild-mod MR and mild to mod MS (mean MVA gradient 7 mmHg).  ~ functional bicuspid AoV w/ Mod Sclerosis - no AS.  Thoracic Aorta (4.1 & 3.7 @ Sinus of  V, prox Ascending) - mild dilation  . TRANSTHORACIC ECHOCARDIOGRAM  08/02/2018   Normal LV Fxn (EF 60-65%) no RWMA.  Severe LA dilation & Severe TR.    There were no vitals filed for this visit.   Subjective Assessment - 02/16/21 1019    Subjective Pt states that she had a good week. She still has some pulling with some of her hip stretches but otherwise she is doing well.    Pertinent History HTN, RCR, peripheral neuropathy (feet), mitral valve replacement    Patient Stated Goals to get rid of your pain    Currently in Pain? No/denies              [facility] PT Assessment - 02/16/21 0001      Assessment   Medical Diagnosis LBP    Referring Provider (PT) Garret Reddish, MD    Onset Date/Surgical Date 01/10/21    Hand Dominance Right    Prior Therapy yes      Precautions    Precautions None      Restrictions   Weight Bearing Restrictions No      Balance Screen   Has the patient fallen in the past 6 months No    Has the patient had a decrease in activity level because of a fear of falling?  No    Is the patient reluctant to leave their home because of a fear of falling?  No      Home Environment   Additional Comments moving to one level apartment      Prior Function   Level of Independence Independent    Vocation Retired  Cognition   Overall Cognitive Status Within Functional Limits for tasks assessed      Observation/Other Assessments   Focus on Therapeutic Outcomes (FOTO)  88      Posture/Postural Control   Posture Comments --      AROM   Lumbar Flexion 75% limited by HS    Lumbar Extension atleast neutral    Lumbar - Right Side Bend 25% no increased pain    Lumbar - Left Side Bend 25% pull on right no increased pain    Lumbar - Right Rotation 15% bil with radicular pain down right leg with both directions      Strength   Overall Strength Comments improved core stability with LE testing- WNL in sitting      Flexibility   Soft Tissue Assessment /Muscle Length yes    Hamstrings WNL    Piriformis WNL    Quadratus Lumborum --      Palpation   Spinal mobility --    Palpation comment --      Transfers   Comments no pain with transitions                         [facility] Adult PT Treatment/Exercise - 02/16/21 0001      Lumbar Exercises: Stretches   Sports administrator 2 reps;20 seconds    Quad Stretch Limitations standing with LE in chair    Piriformis Stretch Right;5 reps    Piriformis Stretch Limitations proximal nerve glide      Lumbar Exercises: Standing   Other Standing Lumbar Exercises hip extension x10 reps each, UE supported on counter    Other Standing Lumbar Exercises BUE pressdown with green TB      Manual Therapy   Soft tissue mobilization STM Rt gluteals, Rt lateral quads/hamstrings with rolling stick                   PT Education - 02/16/21 1100    Education Details updated HEP    Person(s) Educated Patient    Methods Explanation;Handout;Verbal cues    Comprehension Verbalized understanding;Returned demonstration            PT Short Term Goals - 02/16/21 1035      PT SHORT TERM GOAL #1   Title be independent in initial HEP    Status Achieved      PT SHORT TERM GOAL #2   Title report a 30% reduction in the frequency and intensity of LBP with ADLs and self-care    Baseline 98%    Status Achieved             PT Long Term Goals - 02/16/21 1036      PT LONG TERM GOAL #1   Title be independent in advanced HEP    Time 8    Period Weeks    Status Achieved      PT LONG TERM GOAL #2   Title report decreased LBP by 75% or more and report no LE sx.    Time 8    Period Weeks    Status Achieved      PT LONG TERM GOAL #3   Title Patient to demonstrate lumbar ext and rotation WFLs to perform ADLS including reaching OH    Time 8    Period Weeks    Status Achieved      PT LONG TERM GOAL #4   Title Ind in the use of correct body mechanics for ADLS  to prevent further injury    Time 8    Period Weeks    Status Achieved      PT LONG TERM GOAL #5   Title FOTO FSPM >= 64    Baseline 88    Time 8    Period Weeks    Status Achieved                 Plan - 02/16/21 2042    Clinical Impression Statement Pt was discharged this visit having met all of her short and long term goals. Her FOTO has improved to 88%. She denies having LE symptoms with her ADLs and has only accosional Rt buttock discomfort with end range hip movement. Pt is pleased with her progress and demonstrated understanding of her HEP updates. She is agreeable with d/c at this time.    Comorbidities HTN, RCR, peripheral neuropathy (feet), mitral valve replacement    PT Treatment/Interventions ADLs/Self Care Home Management;Aquatic Therapy;Cryotherapy;Electrical Stimulation;Moist  Heat;Traction;Therapeutic exercise;Neuromuscular re-education;Patient/family education;Manual techniques;Dry needling;Taping    PT Home Exercise Plan [id number]           Patient will benefit from skilled therapeutic intervention in order to improve the following deficits and impairments:  Abnormal gait,Decreased mobility,Increased muscle spasms,Pain,Decreased range of motion,Decreased activity tolerance,Decreased strength,Impaired flexibility,Postural dysfunction  Visit Diagnosis: Acute right-sided low back pain with right-sided sciatica  Other muscle spasm     Problem List Patient Active Problem List   Diagnosis Date Noted  . Palpitations 11/10/2019  . Pleural effusion, left   . Sinus node dysfunction (HCC)   . S/P minimally invasive tricuspid valve repair 06/12/2019  . S/P Maze operation for atrial fibrillation 06/12/2019  . 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 Bicuspid aortic valve 01/28/2019  . Tricuspid valve insufficiency   . Severe tricuspid regurgitation   . Long term (current) use of anticoagulants 08/28/2018  . Persistent atrial fibrillation ([place])   . DOE (dyspnea on exertion) 07/26/2018  . Osteopenia 03/13/2017  . Ocular migraine 03/13/2017  . OSA (obstructive sleep apnea) 12/21/2016  . IBS (irritable bowel syndrome) 09/12/2016  . History of adenomatous polyp of colon 09/12/2016  . Former smoker 09/12/2016  . Carpal tunnel syndrome 09/04/2013  . Lower extremity neuropathy 08/23/2013  . Achilles tendon tear 01/17/2011  . Hyperlipemia 01/07/2010  . GERD with stricture 06/19/2009  . ACNE ROSACEA 04/03/2008  . Essential tremor 11/16/2007  . Essential hypertension 11/16/2007   PHYSICAL THERAPY DISCHARGE SUMMARY  Visits from Start of Care: 6  Current functional level related to goals / functional outcomes: See above for more details    Remaining deficits See above for more details    Education / Equipment: See above for more details    Plan: Patient agrees to discharge.  Patient goals were met. Patient is being discharged due to being pleased with the current functional level.  ?????        8:50 PM,02/16/21 Sherol Dade PT, [facility] [address] [place], [place], [address] Phone: [phone]   Fax:  [phone]  Name: Adriana Hendricks MRN: [id number] Date of Birth: 08/26/1935

## 2021-03-01 ENCOUNTER — Ambulatory Visit (INDEPENDENT_AMBULATORY_CARE_PROVIDER_SITE_OTHER): Payer: Medicare Other

## 2021-03-01 ENCOUNTER — Other Ambulatory Visit: Payer: Self-pay

## 2021-03-01 DIAGNOSIS — I4819 Other persistent atrial fibrillation: Secondary | ICD-10-CM | POA: Diagnosis not present

## 2021-03-01 DIAGNOSIS — Z7901 Long term (current) use of anticoagulants: Secondary | ICD-10-CM

## 2021-03-01 LAB — POCT INR: INR: 2 (ref 2.0–3.0)

## 2021-03-01 NOTE — Patient Instructions (Signed)
Continue with 1 tablet daily except 2 tablets each Monday, Wednesday and Friday.  Repeat INR in 4 weeks

## 2021-03-03 ENCOUNTER — Ambulatory Visit: Payer: Medicare Other

## 2021-03-03 NOTE — Chronic Care Management (AMB) (Signed)
  Chronic Care Management   Outreach Note   Name: Adriana Hendricks MRN: [id number] DOB: 1935/05/11  Referred by: Marin Olp, MD Reason for referral: Telephone Appointment with [place] Pharmacist, [name].   An unsuccessful telephone outreach was attempted today. The patient was referred to the pharmacist for assistance with care management and care coordination.   Telephone appointment with clinical pharmacist today (03/03/2021) at 1pm. If patient immediately returns call, transfer to [phone]. Otherwise, please provide this number so patient can reschedule visit.   [name], Pharm.D., BCGP Clinical Pharmacist [facility] [phone]

## 2021-03-04 ENCOUNTER — Telehealth: Payer: Self-pay

## 2021-03-04 NOTE — Chronic Care Management (AMB) (Signed)
° ° °  Chronic Care Management Pharmacy Assistant   Name: Adriana Hendricks  MRN: [id number] DOB: Aug 19, 1935  Reason for Encounter: Disease State /General Adherence Call    Recent office visits:  n/a  Recent consult visits:  [place] [facility] visits:  None in previous 6 months  Medications: Outpatient Encounter Medications as of 03/04/2021  Medication Sig   aspirin EC 81 MG EC tablet Take 1 tablet (81 mg total) by mouth daily.   B Complex Vitamins (B COMPLEX 100 PO) Take 1 tablet by mouth daily.  (Patient not taking: Reported on 01/20/2021)   calcium carbonate (TUMS - DOSED IN MG ELEMENTAL CALCIUM) 500 MG chewable tablet Chew 1 tablet by mouth daily.    cholecalciferol (VITAMIN D3) 25 MCG (1000 UT) tablet Take 1,000 Units by mouth daily.   famotidine (PEPCID) 20 MG tablet Take 20 mg by mouth at bedtime.   ferrous sulfate 325 (65 FE) MG EC tablet Take 325 mg by mouth daily with breakfast. (Patient not taking: Reported on 01/20/2021)   furosemide (LASIX) 40 MG tablet Take 1/2 (one-half) tablet by mouth once daily   metoprolol tartrate (LOPRESSOR) 25 MG tablet Take 25 mg by mouth as needed.   Multiple Vitamins-Minerals (PRESERVISION AREDS 2 PO) Take 1 capsule by mouth in the morning and at bedtime.  (Patient not taking: Reported on 01/20/2021)   nadolol (CORGARD) 40 MG tablet Take 0.5 tablets (20 mg total) by mouth daily.   potassium chloride 20 MEQ/15ML (10%) SOLN TAKE 15 ML BY MOUTH  ONCE DAILY   TURMERIC PO Take 500 mg by mouth 3 (three) times a week.   warfarin (COUMADIN) 2.5 MG tablet TAKE 1 TO 2 TABLETS BY MOUTH ONCE DAILY OR  INSTRUCTED  BY  COUMADIN  CLINIC   No facility-administered encounter medications on file as of 03/04/2021.    Have you had any problems recently with your health? Patient states she has not had any problems recently with her health.  Have you had any problems with your pharmacy? Patient states she has not had any problems recently with her  pharmacy.  What issues or side effects are you having with your medications? Patient states she is not currently having any issues or side effects from any of her medications.  What would you like me to pass along to [name], CPP for him to help you with?  Patient states she does not have anything to pass along at this time.  What can we do to take care of you better? Patient states she is well at this time.  Patient request a call back in about 6 months to schedule follow up appointment with [name], CPP.  Star Rating Drugs: n/a  April D Calhoun, [place] Pharmacist Assistant [phone]

## 2021-03-18 ENCOUNTER — Other Ambulatory Visit: Payer: Self-pay

## 2021-03-18 MED ORDER — POTASSIUM CHLORIDE 20 MEQ/15ML (10%) PO SOLN: ORAL | 0 refills | Status: DC

## 2021-03-23 ENCOUNTER — Telehealth: Payer: Self-pay

## 2021-03-23 NOTE — Chronic Care Management (AMB) (Signed)
    Chronic Care Management Pharmacy Assistant   Name: Adriana Hendricks  MRN: [id number] DOB: Jul 03, 1935  Reason for Encounter: Chart Review  Medications: Outpatient Encounter Medications as of 03/23/2021  Medication Sig  . aspirin EC 81 MG EC tablet Take 1 tablet (81 mg total) by mouth daily.  . B Complex Vitamins (B COMPLEX 100 PO) Take 1 tablet by mouth daily.  (Patient not taking: Reported on 01/20/2021)  . calcium carbonate (TUMS - DOSED IN MG ELEMENTAL CALCIUM) 500 MG chewable tablet Chew 1 tablet by mouth daily.   . cholecalciferol (VITAMIN D3) 25 MCG (1000 UT) tablet Take 1,000 Units by mouth daily.  . famotidine (PEPCID) 20 MG tablet Take 20 mg by mouth at bedtime.  . ferrous sulfate 325 (65 FE) MG EC tablet Take 325 mg by mouth daily with breakfast. (Patient not taking: Reported on 01/20/2021)  . furosemide (LASIX) 40 MG tablet Take 1/2 (one-half) tablet by mouth once daily  . metoprolol tartrate (LOPRESSOR) 25 MG tablet Take 25 mg by mouth as needed.  . Multiple Vitamins-Minerals (PRESERVISION AREDS 2 PO) Take 1 capsule by mouth in the morning and at bedtime.  (Patient not taking: Reported on 01/20/2021)  . nadolol (CORGARD) 40 MG tablet Take 0.5 tablets (20 mg total) by mouth daily.  . potassium chloride 20 MEQ/15ML (10%) SOLN TAKE 15 ML BY MOUTH  ONCE DAILY  . TURMERIC PO Take 500 mg by mouth 3 (three) times a week.  . warfarin (COUMADIN) 2.5 MG tablet TAKE 1 TO 2 TABLETS BY MOUTH ONCE DAILY OR  INSTRUCTED  BY  COUMADIN  CLINIC   No facility-administered encounter medications on file as of 03/23/2021.    Reviewed chart for medication changes.  No OVs, Consults, or hospital visits since last care coordination call/Pharmacist visit.  No medication changes indicated.  April D Calhoun, [place] Pharmacist Assistant [phone]

## 2021-03-29 ENCOUNTER — Ambulatory Visit (INDEPENDENT_AMBULATORY_CARE_PROVIDER_SITE_OTHER): Payer: Medicare Other

## 2021-03-29 ENCOUNTER — Other Ambulatory Visit: Payer: Self-pay

## 2021-03-29 DIAGNOSIS — I4819 Other persistent atrial fibrillation: Secondary | ICD-10-CM

## 2021-03-29 DIAGNOSIS — Z7901 Long term (current) use of anticoagulants: Secondary | ICD-10-CM | POA: Diagnosis not present

## 2021-03-29 LAB — POCT INR: INR: 2.5 (ref 2.0–3.0)

## 2021-03-29 NOTE — Patient Instructions (Signed)
Continue with 1 tablet daily except 2 tablets each Monday, Wednesday and Friday.  Repeat INR in 6 weeks

## 2021-04-05 DIAGNOSIS — H52203 Unspecified astigmatism, bilateral: Secondary | ICD-10-CM | POA: Diagnosis not present

## 2021-04-05 DIAGNOSIS — H353131 Nonexudative age-related macular degeneration, bilateral, early dry stage: Secondary | ICD-10-CM | POA: Diagnosis not present

## 2021-04-05 DIAGNOSIS — H43813 Vitreous degeneration, bilateral: Secondary | ICD-10-CM | POA: Diagnosis not present

## 2021-04-05 DIAGNOSIS — H04123 Dry eye syndrome of bilateral lacrimal glands: Secondary | ICD-10-CM | POA: Diagnosis not present

## 2021-04-08 DIAGNOSIS — Z23 Encounter for immunization: Secondary | ICD-10-CM | POA: Diagnosis not present

## 2021-04-15 ENCOUNTER — Other Ambulatory Visit: Payer: Self-pay | Admitting: Cardiology

## 2021-05-12 ENCOUNTER — Ambulatory Visit (INDEPENDENT_AMBULATORY_CARE_PROVIDER_SITE_OTHER): Payer: Medicare Other

## 2021-05-12 ENCOUNTER — Other Ambulatory Visit: Payer: Self-pay

## 2021-05-12 DIAGNOSIS — I4819 Other persistent atrial fibrillation: Secondary | ICD-10-CM | POA: Diagnosis not present

## 2021-05-12 DIAGNOSIS — Z7901 Long term (current) use of anticoagulants: Secondary | ICD-10-CM

## 2021-05-12 LAB — POCT INR: INR: 1.7 — AB (ref 2.0–3.0)

## 2021-05-12 NOTE — Patient Instructions (Signed)
Take 3 tablets today only and then Continue with 1 tablet daily except 2 tablets each Monday, Wednesday and Friday.  Repeat INR in 5 weeks

## 2021-05-15 ENCOUNTER — Other Ambulatory Visit: Payer: Self-pay | Admitting: Cardiology

## 2021-05-19 ENCOUNTER — Telehealth: Payer: Self-pay

## 2021-05-19 MED ORDER — WARFARIN SODIUM 2.5 MG PO TABS: ORAL_TABLET | ORAL | 0 refills | Status: DC

## 2021-05-19 NOTE — Telephone Encounter (Signed)
REFILL SENT 

## 2021-05-20 ENCOUNTER — Telehealth: Payer: Self-pay

## 2021-05-20 NOTE — Chronic Care Management (AMB) (Signed)
    Chronic Care Management Pharmacy Assistant   Name: Adriana Hendricks  MRN: [id number] DOB: 08/06/1935  Reason for Encounter: General Adherence Call  Recent office visits:  No visits noted  Recent consult visits:  No visits noted  Hospital visits:  None in previous 6 months  Medications: Outpatient Encounter Medications as of 05/20/2021  Medication Sig  . aspirin EC 81 MG EC tablet Take 1 tablet (81 mg total) by mouth daily.  . B Complex Vitamins (B COMPLEX 100 PO) Take 1 tablet by mouth daily.  (Patient not taking: Reported on 01/20/2021)  . calcium carbonate (TUMS - DOSED IN MG ELEMENTAL CALCIUM) 500 MG chewable tablet Chew 1 tablet by mouth daily.   . cholecalciferol (VITAMIN D3) 25 MCG (1000 UT) tablet Take 1,000 Units by mouth daily.  . famotidine (PEPCID) 20 MG tablet Take 20 mg by mouth at bedtime.  . ferrous sulfate 325 (65 FE) MG EC tablet Take 325 mg by mouth daily with breakfast. (Patient not taking: Reported on 01/20/2021)  . furosemide (LASIX) 40 MG tablet Take 1/2 (one-half) tablet by mouth once daily  . metoprolol tartrate (LOPRESSOR) 25 MG tablet Take 25 mg by mouth as needed.  . Multiple Vitamins-Minerals (PRESERVISION AREDS 2 PO) Take 1 capsule by mouth in the morning and at bedtime.  (Patient not taking: Reported on 01/20/2021)  . nadolol (CORGARD) 40 MG tablet Take 0.5 tablets (20 mg total) by mouth daily.  . potassium chloride 20 MEQ/15ML (10%) SOLN TAKE 15 ML BY MOUTH  ONCE DAILY  . TURMERIC PO Take 500 mg by mouth 3 (three) times a week.  . warfarin (COUMADIN) 2.5 MG tablet TAKE 1 TO 2 TABLETS BY MOUTH ONCE DAILY OR  INSTRUCTED  BY  COUMADIN  CLINIC   No facility-administered encounter medications on file as of 05/20/2021.   Several unsuccessful attempts made to contact patient    Have you had any problems recently with your health?  Have you had any problems with your pharmacy?  What issues or side effects are you having with your medications?  What would  you like me to pass along to Edison Nasuti Potts,CPP for them to help you with?   What can we do to take care of you better?  Wilford Sports CPA, CMA

## 2021-06-18 ENCOUNTER — Ambulatory Visit (INDEPENDENT_AMBULATORY_CARE_PROVIDER_SITE_OTHER): Payer: Medicare Other | Admitting: Cardiology

## 2021-06-18 ENCOUNTER — Ambulatory Visit (INDEPENDENT_AMBULATORY_CARE_PROVIDER_SITE_OTHER): Payer: Medicare Other | Admitting: Pharmacist

## 2021-06-18 ENCOUNTER — Encounter: Payer: Self-pay | Admitting: Cardiology

## 2021-06-18 ENCOUNTER — Other Ambulatory Visit: Payer: Self-pay

## 2021-06-18 VITALS — BP 140/82 | HR 73 | Resp 18 | Ht 65.0 in | Wt 171.4 lb

## 2021-06-18 DIAGNOSIS — I051 Rheumatic mitral insufficiency: Secondary | ICD-10-CM | POA: Diagnosis not present

## 2021-06-18 DIAGNOSIS — R0600 Dyspnea, unspecified: Secondary | ICD-10-CM

## 2021-06-18 DIAGNOSIS — I495 Sick sinus syndrome: Secondary | ICD-10-CM | POA: Diagnosis not present

## 2021-06-18 DIAGNOSIS — R7309 Other abnormal glucose: Secondary | ICD-10-CM | POA: Diagnosis not present

## 2021-06-18 DIAGNOSIS — I83893 Varicose veins of bilateral lower extremities with other complications: Secondary | ICD-10-CM | POA: Diagnosis not present

## 2021-06-18 DIAGNOSIS — G4733 Obstructive sleep apnea (adult) (pediatric): Secondary | ICD-10-CM | POA: Diagnosis not present

## 2021-06-18 DIAGNOSIS — I10 Essential (primary) hypertension: Secondary | ICD-10-CM

## 2021-06-18 DIAGNOSIS — Q231 Congenital insufficiency of aortic valve: Secondary | ICD-10-CM

## 2021-06-18 DIAGNOSIS — I071 Rheumatic tricuspid insufficiency: Secondary | ICD-10-CM

## 2021-06-18 DIAGNOSIS — I4819 Other persistent atrial fibrillation: Secondary | ICD-10-CM | POA: Diagnosis not present

## 2021-06-18 DIAGNOSIS — Z9889 Other specified postprocedural states: Secondary | ICD-10-CM

## 2021-06-18 DIAGNOSIS — Z8679 Personal history of other diseases of the circulatory system: Secondary | ICD-10-CM | POA: Diagnosis not present

## 2021-06-18 DIAGNOSIS — Z7901 Long term (current) use of anticoagulants: Secondary | ICD-10-CM

## 2021-06-18 DIAGNOSIS — R0609 Other forms of dyspnea: Secondary | ICD-10-CM

## 2021-06-18 DIAGNOSIS — E785 Hyperlipidemia, unspecified: Secondary | ICD-10-CM | POA: Diagnosis not present

## 2021-06-18 LAB — POCT INR: INR: 2.1 (ref 2.0–3.0)

## 2021-06-18 MED ORDER — WARFARIN SODIUM 2.5 MG PO TABS: ORAL_TABLET | ORAL | 1 refills | Status: DC

## 2021-06-18 MED ORDER — POTASSIUM CHLORIDE 20 MEQ/15ML (10%) PO SOLN: ORAL | 6 refills | Status: DC

## 2021-06-18 MED ORDER — FUROSEMIDE 20 MG PO TABS: 20.0000 mg | ORAL_TABLET | Freq: Every day | ORAL | 3 refills | Status: DC

## 2021-06-18 MED ORDER — METOPROLOL TARTRATE 25 MG PO TABS: 25.0000 mg | ORAL_TABLET | ORAL | 6 refills | Status: DC | PRN

## 2021-06-18 MED ORDER — NADOLOL 20 MG PO TABS: 20.0000 mg | ORAL_TABLET | Freq: Every day | ORAL | 3 refills | Status: DC

## 2021-06-18 NOTE — Patient Instructions (Addendum)
Medication Instructions:    *If you need a refill on your cardiac medications before your next appointment, please call your pharmacy*   Lab Work:  Lipid Cmp Cbc  If you have labs (blood work) drawn today and your tests are completely normal, you will receive your results only by: [place] (if you have MyChart) OR A paper copy in the mail If you have any lab test that is abnormal or we need to change your treatment, we will call you to review the results.   Testing/Procedures: Will be schedule for echo in  Aug/Sept 2022  at [place] has requested that you have an echocardiogram. Echocardiography is a painless test that uses sound waves to create images of your heart. It provides your doctor with information about the size and shape of your heart and how well your heart's chambers and valves are working. This procedure takes approximately one hour. There are no restrictions for this procedure.    Follow-Up: At [facility], you and your health needs are our priority.  As part of our continuing mission to provide you with exceptional heart care, we have created designated Provider Care Teams.  These Care Teams include your primary Cardiologist (physician) and Advanced Practice Providers (APPs -  Physician Assistants and Nurse Practitioners) who all work together to provide you with the care you need, when you need it.     Your next appointment:    6 month virtual  12 month(s)  The format for your next appointment:   In Person  Provider:   Glenetta Hew, MD   Other Instructions

## 2021-06-18 NOTE — Progress Notes (Signed)
Primary Care Provider: Marin Olp, MD Cardiologist: Glenetta Hew, MD Electrophysiologist: None  Clinic Note: Chief Complaint  Patient presents with   Follow-up    Doing well -- more active. Energy Level better Mild ankle swelling   Cardiac Valve Problem    No CHF Sx   Atrial Fibrillation    Has had to take extra dose of metoprolol 3 times in the last year.  Once in the last month.  No prolonged spells    ===================================  ASSESSMENT/PLAN   Problem List Items Addressed This Visit     Rheumatic mitral regurgitation - Primary (Chronic)    She still has evidence of rheumatic disease in the both the aortic and mitral valves.  We will continue to follow with echocardiograms.  She should be due for echo this August-September..       Relevant Medications   metoprolol tartrate (LOPRESSOR) 25 MG tablet   nadolol (CORGARD) 20 MG tablet   furosemide (LASIX) 20 MG tablet   Other Relevant Orders   EKG 12-Lead (Completed)   ECHOCARDIOGRAM COMPLETE   Lipid panel   Comprehensive metabolic panel   CBC   TSH   Hemoglobin A1c   Sinus node dysfunction (HCC)    Has not had any signs of bradycardia on current dose of nadolol.  Tolerating low-dose nadolol better than she did Toprol.  She is only required PRN short acting metoprolol 3 times in the last year.  Pretty well controlled.       Relevant Medications   metoprolol tartrate (LOPRESSOR) 25 MG tablet   nadolol (CORGARD) 20 MG tablet   furosemide (LASIX) 20 MG tablet   Hyperlipidemia with target LDL less than 100 (Chronic)    Have not seen labs checked since April.  We will go ahead and order lipid panel as well as chemistries and A1c.       Relevant Medications   metoprolol tartrate (LOPRESSOR) 25 MG tablet   nadolol (CORGARD) 20 MG tablet   furosemide (LASIX) 20 MG tablet   Essential hypertension (Chronic)    Her pressures are pretty stable she says at home they are in the 130/70 mmHg range.  A  little higher today I am reluctant to be overly aggressive treating to avoid orthostasis.  She is on low-dose nadolol along with furosemide.       Relevant Medications   metoprolol tartrate (LOPRESSOR) 25 MG tablet   nadolol (CORGARD) 20 MG tablet   furosemide (LASIX) 20 MG tablet   Other Relevant Orders   Lipid panel   Comprehensive metabolic panel   CBC   TSH   Hemoglobin A1c   OSA (obstructive sleep apnea) (Chronic)    Continue use of CPAP.       DOE (dyspnea on exertion) (Chronic)    I think she is now back to her baseline.  Very active, happy with her new living situation.  Exercising routinely now and doing well.       Persistent atrial fibrillation (HCC) (Chronic)    She has done remarkably well since her maze procedure.  Is on low-dose nadolol for rate control.  Per her choice, she is on warfarin and doing well.  With her having rheumatic valvular disease, warfarin is probably the best option.  No major bleeding issues.  Has had 3 episodes where she is felt fast heart rates in the last year.  Each have broken with PRN low-dose metoprolol.  Plan: Continue warfarin and current dose of nadolol.  Refill  metoprolol for breakthrough.       Relevant Medications   metoprolol tartrate (LOPRESSOR) 25 MG tablet   nadolol (CORGARD) 20 MG tablet   furosemide (LASIX) 20 MG tablet   Other Relevant Orders   EKG 12-Lead (Completed)   Lipid panel   Comprehensive metabolic panel   CBC   TSH   Hemoglobin A1c   Long term (current) use of anticoagulants: CHA2DS2Vasc 5, WARFARIN - Rheumatic Vavle Disease (Chronic)    CHA2DS2-VASc score: This patients CHA2DS2-VASc Score and unadjusted Ischemic Stroke Rate (% per year) is equal to 7.2 % stroke rate/year from a score of 5  Above score calculated as 1 point each if present [CHF, HTN, DM, Vascular=MI/PAD/Aortic Plaque, Age if 65-74, or Female] Above score calculated as 2 points each if present [Age > 75, or Stroke/TIA/TE]  To take  tach tach tach       Tricuspid valve insufficiency (Chronic)    Status post TVR.  Was stable on echocardiogram in August 2022.  Plan to be to follow-up echocardiogram this August.         Relevant Medications   metoprolol tartrate (LOPRESSOR) 25 MG tablet   nadolol (CORGARD) 20 MG tablet   furosemide (LASIX) 20 MG tablet   Bicuspid aortic valve (Chronic)    Probably functional related to rheumatic as well as senile calcification.  There is sclerosis noted but no stenosis which would confirm that it is functional and not congenital.  She does have some aortic dilation.  Plan: Continue to follow with echo roughly every 2 years.       Relevant Medications   metoprolol tartrate (LOPRESSOR) 25 MG tablet   nadolol (CORGARD) 20 MG tablet   furosemide (LASIX) 20 MG tablet   S/P minimally invasive tricuspid valve repair (Chronic)    Complex valvuloplasty by Dr. Roxy Manns.  It seems like 2 years out from her surgery, she is finally hitting her stride with regaining energy level and improved exertional dyspnea.  Follow-up echo August of this year.       S/P Maze operation for atrial fibrillation (Chronic)    Seems to done a very good job preventing further breakthrough A. fib.  Only 3 episodes of having to take extra metoprolol for breakthrough tachycardia which were very short-lived.       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  They bother her every now and then.  She says maybe once or twice a week she will take 60 mg as opposed to 40 mg of Lasix.  She may take it altogether or take 40 in the morning and 20 in the afternoon.  She does not like to try to put on the support stockings, but is working on on foot elevation.  She says that actually the exercise that she is doing has helped somewhat.        Relevant Medications   metoprolol tartrate (LOPRESSOR) 25 MG tablet   nadolol (CORGARD) 20 MG tablet   furosemide (LASIX) 20 MG tablet   Other Visit Diagnoses     Abnormal glucose level       Relevant Orders   Hemoglobin A1c       ===================================  HPI:    Adriana Hendricks is a 86 y.o. female with a  PMH notable for H/O SEVERE RHEUMATIC TRICUSPID REGURGITATION - complicated by PERSISTENT ATRIAL FIBRILLATION (s/p TVR/MAZE&LAA CLIP 07/5276 - complicated by post-op Pleural Effusions w/ Thoracentesis x 2) & Chronic Dyspnea who presents today for annual follow-up.  Adriana Hendricks was last seen in May 2021 as a 43-month follow-up.  She is feeling pretty much happy with how she is doing.  Energy level doing okay.  Breathing stable.  Still having dyspnea though although there was modest improvement from the surgery.  Was less stressed out.  BP was starting to run a little higher.  No symptoms of irregular heartbeats.  Recent Hospitalizations: None  Reviewed  CV studies:    The following studies were reviewed today: (if available, images/films reviewed: From Epic Chart or Care Everywhere) None:  Interval History:   Adriana Hendricks returns for annual follow-up overall feeling pretty well.  She says that they recently moved to [place].  She has been doing very well with taking advantage of the exercise regimen.  She does at least 3 days a week taking just about every exercise/she can.  She has been much more energetic.  She finally seems like she is progressing of the way she would like.  No real significant exertional dyspnea except for having to wear the mask.  She denies any resting exertional chest tightness or pressure.  No resting dyspnea.  No PND, orthopnea with trivial edema.  She is very happy to mention that she has only had to take PRN metoprolol 3 times in the last year and has not had a prolonged episodes of fast irregular heartbeats  palp or palpitations.   She still uses CPAP.  CV Review of Symptoms (Summary) Cardiovascular ROS: positive for - trivial edema and occasional skipped beats-nothing prolonged.  Legs ache with venous congestion. negative for - chest pain, dyspnea on exertion, orthopnea, paroxysmal nocturnal dyspnea, rapid heart rate, shortness of breath, or lightheadedness, dizziness or wooziness, syncope/near syncope or TIA/amaurosis fugax, claudication  REVIEWED OF SYSTEMS   Review of Systems  Constitutional:  Negative for malaise/fatigue (Energy level seems to be doing much better now that she is more active.) and weight loss.  Gastrointestinal:  Negative for blood in stool.  Genitourinary:  Negative for hematuria.  Musculoskeletal:  Positive for myalgias (Her legs mostly in the lower legs ache worse on the right.  She does have some mild varicose veins swelling and engorgement.).  Neurological:  Negative for dizziness, focal weakness and weakness.  Psychiatric/Behavioral:  Negative for depression and memory loss. The patient is not nervous/anxious (Notably less anxious).    I have reviewed and (if needed) personally updated the patient's problem list, medications, allergies, past medical and surgical history, social and family history.   PAST MEDICAL HISTORY   Past Medical History:  Diagnosis Date   Achilles tendon rupture    Arthritis of hand, right    Atrial flutter with rapid ventricular response ([place])    Carpal tunnel syndrome 09/04/2013   Collagenous colitis 2005   Colon polyps 2010   Tubular adenoma and hyperplastic   Dental infection 10/25/2014   Diverticulosis    Esophageal stenosis    Essential tremor    GERD (gastroesophageal reflux disease)    hx hiatal hernia, hx esophagitis, hx stricture   Hiatal hernia    Hypertension    Lower extremity neuropathy 08/23/2013   On B12 therapy with numbness in the feet bilaterally no evidence of diabetes    Ocular migraine    jagged  vision, a few  per month   OSA (obstructive sleep apnea) 12/21/2016   on CPAP   Peripheral neuropathy    treated by Dr Posey Pronto (07/2015)   Persistent atrial fibrillation ([place])    Rate control with beta-blocker and anticoagulated with warfarin   Pleural effusion, left    S/P Maze operation for atrial fibrillation 06/12/2019   Complete bilateral atrial lesion set using cryothermy and bipolar radiofrequency ablation with clipping of LA appendage via right mini thoracotomy approach   S/P minimally invasive tricuspid valve repair 06/12/2019   Complex valvuloplasty including autologous pericardial patch augmentation of anterior and posterior leaflets with 28 mm Edwards mc3 ring annuloplasty via right mini thoracotomy approach   Severe tricuspid regurgitation 07/2018   Noted Aug 2019 during a fib workup. Severe LAE as well   Sinus node dysfunction (HCC)     PAST SURGICAL HISTORY   Past Surgical History:  Procedure Laterality Date   48 hr Holter Monitor  07/2018   Persistent Afib (rate 33 - 124 bpm)   APPENDECTOMY     CARDIAC CATHETERIZATION     CARDIOVERSION N/A 10/02/2018   Procedure: CARDIOVERSION;  Surgeon: Acie Fredrickson Wonda Cheng, MD;  Location: [facility];  Service: Cardiovascular;  Laterality: N/A;   CARPAL TUNNEL RELEASE     CATARACT EXTRACTION     EXCISION MORTON'S NEUROMA     Right foot   EYE SURGERY     Cataracts with implants-bilateral   INTRAOCULAR LENS IMPLANT, SECONDARY     IR THORACENTESIS ASP PLEURAL SPACE W/IMG GUIDE  07/10/2019   IR THORACENTESIS ASP PLEURAL SPACE W/IMG GUIDE  07/10/2019   IR THORACENTESIS ASP PLEURAL SPACE W/IMG GUIDE  07/26/2019   MINIMALLY INVASIVE MAZE PROCEDURE N/A 06/12/2019   Procedure: MINIMALLY INVASIVE MAZE PROCEDURE;  Surgeon: Rexene Alberts, MD;  Location: [facility];  Service: Open Heart Surgery;  Laterality: N/A;   MINIMALLY INVASIVE TRICUSPID VALVE REPAIR Right 06/12/2019   Procedure: MINIMALLY INVASIVE TRICUSPID VALVE REPAIR USING EDWARDS MC3 T28 ANNULOPLASTY RING,  INSERTION TEMPORARY TRANSVENOUS AV PACING LEAD;  Surgeon: Rexene Alberts, MD;  Location: [facility];  Service: Open Heart Surgery;  Laterality: Right;   Moles removed     MOUTH SURGERY     (For Exostosis)   NUCLEAR  07/2018   EF 71 %. LOW RISK - no ischemia or Infarct.    RIGHT/LEFT HEART CATH AND CORONARY ANGIOGRAPHY N/A 01/16/2019   Procedure: RIGHT/LEFT HEART CATH AND CORONARY ANGIOGRAPHY;  Surgeon: Leonie Man, MD;  Location: [facility];; Angiographically normal coronary arteries.  Normal LVEDP. Upper Limit of Normal PAP~23 mmHg & PCWP 18 mmHg.  LVEDP of 7 mmHg. -With mean PAP of 23 mmHg there is a TPG suggesting a primary pulmonary etiology.   ROTATOR CUFF REPAIR     TEE WITHOUT CARDIOVERSION N/A 01/16/2019   Procedure: TRANSESOPHAGEAL ECHOCARDIOGRAM (TEE);  Surgeon: Sueanne Margarita, MD;  Location: [facility];;  Severe TR due to poor coaptation of the leaflets from annular dilation.  Mild to moderate mitral vegetation with mildly restricted mobility of the posterior leaflet.  EF 55-60% with no R WMA.  Severe RA and LA dilation.   TEE WITHOUT CARDIOVERSION N/A 06/12/2019   Procedure: TRANSESOPHAGEAL ECHOCARDIOGRAM (TEE);  Surgeon: Rexene Alberts, MD;  Location: [facility];  Service: Open Heart Surgery;  Laterality: N/A;   TONSILLECTOMY     TRANSTHORACIC ECHOCARDIOGRAM  08/05/2019   First postop TAVR: EF 60 to 65%. Mod concentric LVH. Grade III DD (?  w/ Mild LA dilation).  Rheumatic MV w/ mod thickening & Ca2+.  Anterior leaflet has mild doming but no MVP.  Mild-mod MR and mild to mod MS (mean MVA gradient 7 mmHg).  ~ functional bicuspid AoV w/ Mod Sclerosis - no AS.  Thoracic Aorta (4.1 & 3.7 @ Sinus of  V, prox Ascending) - mild dilation   TRANSTHORACIC ECHOCARDIOGRAM  08/02/2018   Normal LV Fxn (EF 60-65%) no RWMA.  Severe LA dilation & Severe TR.    Immunization History  Administered Date(s) Administered   Fluad Quad(high Dose 65+) 10/08/2019, 10/12/2020   Influenza Split  12/01/2011, 11/05/2012   Influenza Whole 01/07/2010, 10/08/2010   Influenza, High Dose Seasonal PF 10/01/2014, 11/10/2015, 10/25/2016, 10/30/2017, 10/22/2018   Influenza,inj,Quad PF,6+ Mos 08/23/2013   PFIZER(Purple Top)SARS-COV-2 Vaccination 01/04/2020, 01/25/2020, 09/22/2020   Pneumococcal Conjugate-13 04/13/2015   Pneumococcal Polysaccharide-23 12/26/2005   Td 12/26/2001   Tdap 01/23/2012   Zoster Recombinat (Shingrix) 05/21/2021   Zoster, Live 02/08/2010    MEDICATIONS/ALLERGIES   Current Meds  Medication Sig   calcium carbonate (TUMS - DOSED IN MG ELEMENTAL CALCIUM) 500 MG chewable tablet Chew 1 tablet by mouth daily.    calcium-vitamin D (OSCAL WITH D) 500-200 MG-UNIT tablet Take 1 tablet by mouth. PT ISN'T SURE OF THE DOSAGE   cholecalciferol (VITAMIN D3) 25 MCG (1000 UT) tablet Take 1,000 Units by mouth daily.   famotidine (PEPCID) 20 MG tablet Take 20 mg by mouth at bedtime.   furosemide (LASIX) 20 MG tablet Take 1 tablet (20 mg total) by mouth daily. May take an additional 20 mg if needed for swelling or shortness of breath   Multiple Vitamins-Minerals (PRESERVISION AREDS 2 PO) Take 1 capsule by mouth in the morning and at bedtime.   nadolol (CORGARD) 20 MG tablet Take 1 tablet (20 mg total) by mouth daily.   TURMERIC PO Take 500 mg by mouth 3 (three) times a week.   vitamin B-12 (CYANOCOBALAMIN) 250 MCG tablet Take 250 mcg by mouth daily. PT ISN'T SURE OF THE DOSAGE   [DISCONTINUED] aspirin EC 81 MG EC tablet Take 1 tablet (81 mg total) by mouth daily.   [DISCONTINUED] furosemide (LASIX) 40 MG tablet Take 1/2 (one-half) tablet by mouth once daily   [DISCONTINUED] metoprolol tartrate (LOPRESSOR) 25 MG tablet Take 25 mg by mouth as needed.   [DISCONTINUED] nadolol (CORGARD) 40 MG tablet Take 0.5 tablets (20 mg total) by mouth daily.   [DISCONTINUED] potassium chloride 20 MEQ/15ML (10%) SOLN TAKE 15 ML BY MOUTH  ONCE DAILY   [DISCONTINUED] warfarin (COUMADIN) 2.5 MG tablet TAKE  1 TO 2 TABLETS BY MOUTH ONCE DAILY OR  INSTRUCTED  BY  COUMADIN  CLINIC    Allergies  Allergen Reactions   Levofloxacin Other (See Comments)    Developed tendon pain. (Has a history of a Achilles tendon tear)   Potassium Chloride Hives   Clarithromycin Nausea Only   Erythromycin Base Nausea And Vomiting   Metronidazole Rash   Penicillins Rash and Other (See Comments)    Did it involve swelling of the face/tongue/throat, SOB, or low BP? No Did it involve sudden or severe rash/hives, skin peeling, or any reaction on the inside of your mouth or nose? No Did you need to seek medical attention at a hospital or doctor's office? No When did it last happen?  as a child If all above answers are "NO", may proceed with cephalosporin use.     SOCIAL HISTORY/FAMILY HISTORY   Reviewed in Epic:  Pertinent findings:  Social History   Tobacco Use   Smoking status: Former    Packs/day: 2.00    Years: 13.00    Pack years: 26.00    Types: Cigarettes    Start date: 12/26/1954    Quit date: 03/26/1968    Years since quitting: 53.2   Smokeless tobacco: Never   Tobacco comments:    Quit in 1969 - smokeed 2-3PPD , was 3 PPD at her heaviest for about 3 years.   Vaping Use   Vaping Use: Never used  Substance Use Topics   Alcohol use: Yes    Alcohol/week: 2.0 standard drinks    Types: 2 Glasses of wine per week    Comment: 2 glass of wine per day   Drug use: No   Social History   Social History Narrative   Married. Husband is patient of Dr. Thurnell Garbe   Married 1958. 2 children. 4 grandkids (3 girls, 1 boy).    Retired from [employer].   Hobbies: genealogy and DNA   Right handed    One story      Recently moved to a retirement community in [place] (spring 2022).  Taking advantage of the fact that she does not have to cook every day.  Is also taking advantage of the multiple daily exercise classes.  She tries to do classes at least 3 days a week if not more.   She is also doing much more walking around the community.      She and her husband are in the process of selling the house.  Hopefully close in July 2022    OBJCTIVE -PE, EKG, labs   Wt Readings from Last 3 Encounters:  06/18/21 171 lb 6.4 oz (77.7 kg)  07/13/20 169 lb 6.4 oz (76.8 kg)  07/06/20 169 lb (76.7 kg)    Physical Exam: BP 140/82 (BP Location: Left Arm, Patient Position: Sitting, Cuff Size: Normal)   Pulse 73   Resp 18   Ht 5\' 5"  (1.651 m)   Wt 171 lb 6.4 oz (77.7 kg)   LMP  (LMP Unknown)   SpO2 97%   BMI 28.52 kg/m  Physical Exam Constitutional:      General: She is not in acute distress.    Appearance: Normal appearance. She is normal weight. She is not ill-appearing (Healthy-appearing) or diaphoretic.  HENT:     Head: Normocephalic and atraumatic.  Neck:     Vascular: No carotid bruit, hepatojugular reflux or JVD.  Cardiovascular:     Rate and Rhythm: Normal rate and regular rhythm. No extrasystoles are present.    Chest Wall: PMI is not displaced.     Pulses: Normal pulses and intact distal pulses.     Heart sounds: Murmur heard.  High-pitched harsh crescendo-decrescendo early systolic murmur is present with a grade of 1/6 at the upper right sternal border radiating to the neck.    No gallop.  Pulmonary:     Effort: Pulmonary effort is normal. No respiratory distress.     Breath sounds: Normal breath sounds. No wheezing, rhonchi or rales.  Musculoskeletal:        General: Swelling (Trivial/mild with both ankles having spider/varicose veins.) present.     Cervical back: Normal range of motion and neck supple.  Skin:    General: Skin is warm and dry.  Neurological:     General: No focal deficit present.     Mental Status: She is alert and oriented  to person, place, and time.  Psychiatric:        Mood and Affect: Mood normal.        Behavior: Behavior normal.        Thought Content: Thought content normal.        Judgment: Judgment normal.    Adult  ECG Report  Rate: 73 ;  Rhythm: normal sinus rhythm and RSR' suggestive of RV conduction delay. ; Otherwise normal axis, intervals and durations.  Narrative Interpretation: Stable  Recent Labs: Reviewed.  Has not had labs checked since last April. Lab Results  Component Value Date   CHOL 198 04/02/2020   HDL 64.20 04/02/2020   LDLCALC 105 (H) 04/02/2020   LDLDIRECT 128.8 01/17/2011   TRIG 145.0 04/02/2020   CHOLHDL 3 04/02/2020   Lab Results  Component Value Date   CREATININE 1.00 04/02/2020   BUN 24 (H) 04/02/2020   NA 139 04/02/2020   K 4.1 04/02/2020   CL 99 04/02/2020   CO2 31 04/02/2020   CBC Latest Ref Rng & Units 04/02/2020 01/03/2020 08/21/2019  WBC 4.0 - 10.5 K/uL 8.6 7.8 8.5  Hemoglobin 12.0 - 15.0 g/dL 13.7 15.7 9.5(L)  Hematocrit 36.0 - 46.0 % 39.7 47.0(H) 28.6(L)  Platelets 150.0 - 400.0 K/uL 166.0 191 242    Lab Results  Component Value Date   TSH 3.440 06/26/2019    ==================================================  COVID-19 Education: The signs and symptoms of COVID-19 were discussed with the patient and how to seek care for testing (follow up with PCP or arrange E-visit).    I spent a total of 29 minutes with the patient spent in direct patient consultation.  Additional time spent with chart review  / charting (studies, outside notes, etc): 16 min Total Time: 45 min  Current medicines are reviewed at length with the patient today.  (+/- concerns) n/a  This visit occurred during the SARS-CoV-2 public health emergency.  Safety protocols were in place, including screening questions prior to the visit, additional usage of staff PPE, and extensive cleaning of exam room while observing appropriate contact time as indicated for disinfecting solutions.  Notice: This dictation was prepared with Dragon dictation along with smaller phrase technology. Any transcriptional errors that result from this process are unintentional and may not be corrected upon  review.  Patient Instructions / Medication Changes & Studies & Tests Ordered   Patient Instructions  Medication Instructions:    *If you need a refill on your cardiac medications before your next appointment, please call your pharmacy*   Lab Work:  Lipid Cmp Cbc  If you have labs (blood work) drawn today and your tests are completely normal, you will receive your results only by: [facility] (if you have MyChart) OR A paper copy in the mail If you have any lab test that is abnormal or we need to change your treatment, we will call you to review the results.   Testing/Procedures: Will be schedule for echo in  Aug/Sept 2022  at [facility] has requested that you have an echocardiogram. Echocardiography is a painless test that uses sound waves to create images of your heart. It provides your doctor with information about the size and shape of your heart and how well your heart's chambers and valves are working. This procedure takes approximately one hour. There are no restrictions for this procedure.    Follow-Up: At [facility], you and your health needs are our priority.  As part  of our continuing mission to provide you with exceptional heart care, we have created designated Provider Care Teams.  These Care Teams include your primary Cardiologist (physician) and Advanced Practice Providers (APPs -  Physician Assistants and Nurse Practitioners) who all work together to provide you with the care you need, when you need it.     Your next appointment:    6 month virtual  12 month(s)  The format for your next appointment:   In Person  Provider:   Glenetta Hew, MD   Other Instructions    Studies Ordered:   Orders Placed This Encounter  Procedures   Lipid panel   Comprehensive metabolic panel   CBC   TSH   Hemoglobin A1c   EKG 12-Lead   ECHOCARDIOGRAM COMPLETE     Glenetta Hew, M.D., M.S. Interventional Cardiologist    Pager # [phone] Phone # [phone] [address] [place], [place] [address]   Thank you for choosing Heart[facility] at [facility]!!

## 2021-06-22 ENCOUNTER — Other Ambulatory Visit: Payer: Self-pay | Admitting: Cardiology

## 2021-06-25 ENCOUNTER — Encounter: Payer: Self-pay | Admitting: Cardiology

## 2021-06-25 DIAGNOSIS — I83899 Varicose veins of unspecified lower extremities with other complications: Secondary | ICD-10-CM | POA: Insufficient documentation

## 2021-06-25 NOTE — Assessment & Plan Note (Signed)
I think she is now back to her baseline.  Very active, happy with her new living situation.  Exercising routinely now and doing well.

## 2021-06-25 NOTE — Assessment & Plan Note (Signed)
Seems to done a very good job preventing further breakthrough A. fib.  Only 3 episodes of having to take extra metoprolol for breakthrough tachycardia which were very short-lived.

## 2021-06-25 NOTE — Assessment & Plan Note (Signed)
She has done remarkably well since her maze procedure.  Is on low-dose nadolol for rate control.  Per her choice, she is on warfarin and doing well.  With her having rheumatic valvular disease, warfarin is probably the best option.  No major bleeding issues.  Has had 3 episodes where she is felt fast heart rates in the last year.  Each have broken with PRN low-dose metoprolol.  Plan: Continue warfarin and current dose of nadolol.  Refill metoprolol for breakthrough.

## 2021-06-25 NOTE — Assessment & Plan Note (Signed)
Probably functional related to rheumatic as well as senile calcification.  There is sclerosis noted but no stenosis which would confirm that it is functional and not congenital.  She does have some aortic dilation.  Plan: Continue to follow with echo roughly every 2 years.

## 2021-06-25 NOTE — Progress Notes (Signed)
No charg

## 2021-06-25 NOTE — Assessment & Plan Note (Addendum)
She still has evidence of rheumatic disease in the both the aortic and mitral valves.  We will continue to follow with echocardiograms.  She should be due for echo this August-September.Marland Kitchen

## 2021-06-25 NOTE — Assessment & Plan Note (Signed)
CHA2DS2-VASc score: This patients CHA2DS2-VASc Score and unadjusted Ischemic Stroke Rate (% per year) is equal to 7.2 % stroke rate/year from a score of 5  Above score calculated as 1 point each if present [CHF, HTN, DM, Vascular=MI/PAD/Aortic Plaque, Age if 65-74, or Female] Above score calculated as 2 points each if present [Age > 75, or Stroke/TIA/TE]  To take tach tach tach

## 2021-06-25 NOTE — Assessment & Plan Note (Signed)
Her pressures are pretty stable she says at home they are in the 130/70 mmHg range.  A little higher today I am reluctant to be overly aggressive treating to avoid orthostasis.  She is on low-dose nadolol along with furosemide.

## 2021-06-25 NOTE — Assessment & Plan Note (Signed)
Fairly well controlled.  They bother her every now and then.  She says maybe once or twice a week she will take 60 mg as opposed to 40 mg of Lasix.  She may take it altogether or take 40 in the morning and 20 in the afternoon.  She does not like to try to put on the support stockings, but is working on on foot elevation.  She says that actually the exercise that she is doing has helped somewhat.

## 2021-06-25 NOTE — Assessment & Plan Note (Signed)
Has not had any signs of bradycardia on current dose of nadolol.  Tolerating low-dose nadolol better than she did Toprol.  She is only required PRN short acting metoprolol 3 times in the last year.  Pretty well controlled.

## 2021-06-25 NOTE — Assessment & Plan Note (Addendum)
Complex valvuloplasty by Dr. Roxy Manns.  It seems like 2 years out from her surgery, she is finally hitting her stride with regaining energy level and improved exertional dyspnea.  Follow-up echo August of this year.

## 2021-06-25 NOTE — Assessment & Plan Note (Signed)
Have not seen labs checked since April.  We will go ahead and order lipid panel as well as chemistries and A1c.

## 2021-06-25 NOTE — Assessment & Plan Note (Signed)
Continue use of CPAP. 

## 2021-06-25 NOTE — Assessment & Plan Note (Addendum)
Status post TVR.  Was stable on echocardiogram in August 2022.  Plan to be to follow-up echocardiogram this August.

## 2021-07-12 ENCOUNTER — Telehealth: Payer: Self-pay | Admitting: Cardiology

## 2021-07-12 NOTE — Telephone Encounter (Signed)
Ne Message:     Pharmacist said they wanted to know if it is alright to change Manufacturer for patient's Warfarin? The previous manufacturer is on back order.

## 2021-07-12 NOTE — Telephone Encounter (Signed)
Ok to change. Pt has INR check 8/3

## 2021-07-12 NOTE — Telephone Encounter (Signed)
Returned call to [place] at [employer]. Advised her of PharmD's recommendations. Reynolds Bowl states that the patient will not be seeing a change in manufacturer for her warfarin until a month or two from now as patient just recently got her warfarin. But Reynolds Bowl states she just wanted to let office know in case there are any slight changes in patients INR. Advised her that I would forward message to [facility] [employer] that manages Warfarin. Reynolds Bowl verbalized understanding.

## 2021-07-13 ENCOUNTER — Ambulatory Visit (INDEPENDENT_AMBULATORY_CARE_PROVIDER_SITE_OTHER): Payer: Medicare Other | Admitting: Pulmonary Disease

## 2021-07-13 ENCOUNTER — Encounter: Payer: Self-pay | Admitting: Pulmonary Disease

## 2021-07-13 ENCOUNTER — Other Ambulatory Visit: Payer: Self-pay

## 2021-07-13 VITALS — BP 126/82 | HR 84

## 2021-07-13 DIAGNOSIS — G4733 Obstructive sleep apnea (adult) (pediatric): Secondary | ICD-10-CM | POA: Diagnosis not present

## 2021-07-13 NOTE — Patient Instructions (Signed)
Follow up in 1 year.

## 2021-07-13 NOTE — Progress Notes (Signed)
[facility]  Chief Complaint  Patient presents with   Follow-up    Patient does not have any concerns.     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, A fib s/p MAZE, Neuropathy, HTN, HH, GERD, Essential tremor, Esophageal stenosis, Diverticulosis, Colon polyps, Collagenous colitis, OA  Past Surgical History:  She  has a past surgical history that includes Appendectomy; Tonsillectomy; Cataract extraction; Moles removed; Excision Morton's neuroma; Mouth surgery; Intraocular lens implant, secondary; Carpal tunnel release; Rotator cuff repair; 48 hr Holter Monitor (07/2018); NUCLEAR (07/2018); Cardioversion (N/A, 10/02/2018); TEE without cardioversion (N/A, 01/16/2019); RIGHT/LEFT HEART CATH AND CORONARY ANGIOGRAPHY (N/A, 01/16/2019); Eye surgery; Cardiac catheterization; Minimally invasive tricuspid valve repair (Right, 06/12/2019); Minimally invasive maze procedure (N/A, 06/12/2019); TEE without cardioversion (N/A, 06/12/2019); IR THORACENTESIS ASP PLEURAL SPACE W/IMG GUIDE (07/10/2019); IR THORACENTESIS ASP PLEURAL SPACE W/IMG GUIDE (07/10/2019); IR THORACENTESIS ASP PLEURAL SPACE W/IMG GUIDE (07/26/2019); transthoracic echocardiogram (08/05/2019); and transthoracic echocardiogram (08/02/2018).  Brief Summary:  Adriana Hendricks is a 86 y.o. female with obstructive sleep apnea.      Subjective:   She is doing well with CPAP.  She has nasal cushion mask.  Not having sinus congestion, sore throat, or dry mouth.  Gets about 8 to 9 hrs sleep per night.  Physical Exam:   Appearance - well kempt   ENMT - no sinus tenderness, no oral exudate, no LAN, Mallampati 3 airway, no stridor  Respiratory - equal breath sounds bilaterally, no wheezing or rales  CV - s1s2 regular rate and rhythm, 2/6 SM  Ext - no clubbing, no edema  Skin - no  rashes  Psych - normal mood and affect   Sleep Tests:  HST 12/14/16 >> AHI 19.5, SaO2 low 78% Auto CPAP 06/12/21 to 07/11/21 >> used on 30 of 30 nights with average 8 hrs 47 min.  Average AHI 0.5 with median CPAP 7 cm H2O  Cardiac Tests:  Echo 08/05/19 >> EF 60 to 65%, mild/mod MR, mod MS, bicuspid AV, s/p TVR  Social History:  She  reports that she quit smoking about 53 years ago. Her smoking use included cigarettes. She started smoking about 66 years ago. She has a 26.00 pack-year smoking history. She has never used smokeless tobacco. She reports current alcohol use of about 2.0 standard drinks of alcohol per week. She reports that she does not use drugs.  Family History:  Her family history includes Barrett's esophagus in her son; Heart failure in her father; Other in her mother; Rectal cancer in her father.     Assessment/Plan:   Obstructive sleep apnea. - she is compliant with therapy and reports benefit from CPAP - uses [employer] for her DME - continue auto CPAP 5 to 8 cm H2O - she should be eligible for replacement CPAP in 2023  Persistent atrial fibrillation, rheumatic valve disease. - followed by Dr. Glenetta Hew with [facility]  Time Spent Involved in Patient Care on Day of Examination:  23 minutes  Follow up:   Patient Instructions  Follow up in 1 year  Medication List:   Allergies as of 07/13/2021       Reactions   Levofloxacin Other (See Comments)   Developed tendon pain. (Has a history of a Achilles tendon tear)   Potassium Chloride Hives  Clarithromycin Nausea Only   Erythromycin Base Nausea And Vomiting   Metronidazole Rash   Penicillins Rash, Other (See Comments)   Did it involve swelling of the face/tongue/throat, SOB, or low BP? No Did it involve sudden or severe rash/hives, skin peeling, or any reaction on the inside of your mouth or nose? No Did you need to seek medical attention at a hospital or doctor's office? No When did it last happen?  as  a child If all above answers are "NO", may proceed with cephalosporin use.        Medication List        Accurate as of July 13, 2021  2:15 PM. If you have any questions, ask your nurse or doctor.          calcium carbonate 500 MG chewable tablet Commonly known as: TUMS - dosed in mg elemental calcium Chew 1 tablet by mouth daily.   calcium-vitamin D 500-200 MG-UNIT tablet Commonly known as: OSCAL WITH D Take 1 tablet by mouth. PT ISN'T SURE OF THE DOSAGE   cholecalciferol 25 MCG (1000 UNIT) tablet Commonly known as: VITAMIN D3 Take 1,000 Units by mouth daily.   famotidine 20 MG tablet Commonly known as: PEPCID Take 20 mg by mouth at bedtime.   furosemide 20 MG tablet Commonly known as: LASIX Take 1 tablet (20 mg total) by mouth daily. May take an additional 20 mg if needed for swelling or shortness of breath   metoprolol tartrate 25 MG tablet Commonly known as: LOPRESSOR Take 1 tablet (25 mg total) by mouth as needed.   nadolol 20 MG tablet Commonly known as: CORGARD Take 1 tablet (20 mg total) by mouth daily.   potassium chloride 20 MEQ/15ML (10%) Soln Take 15 ml by mouth once daily   PRESERVISION AREDS 2 PO Take 1 capsule by mouth in the morning and at bedtime.   TURMERIC PO Take 500 mg by mouth 3 (three) times a week.   vitamin B-12 250 MCG tablet Commonly known as: CYANOCOBALAMIN Take 250 mcg by mouth daily. PT ISN'T SURE OF THE DOSAGE   warfarin 2.5 MG tablet Commonly known as: COUMADIN Take as directed by the anticoagulation clinic. If you are unsure how to take this medication, talk to your nurse or doctor. Original instructions: TAKE 1 TO 3 TABLETS DAILY AS DIRECTED BY THE COUMADIN CLINIC        Signature:  Chesley Mires, MD [place] Pager - [phone] 07/13/2021, 2:15 PM

## 2021-07-28 ENCOUNTER — Ambulatory Visit (INDEPENDENT_AMBULATORY_CARE_PROVIDER_SITE_OTHER): Payer: Medicare Other

## 2021-07-28 ENCOUNTER — Other Ambulatory Visit: Payer: Self-pay

## 2021-07-28 DIAGNOSIS — I4819 Other persistent atrial fibrillation: Secondary | ICD-10-CM | POA: Diagnosis not present

## 2021-07-28 DIAGNOSIS — Z5181 Encounter for therapeutic drug level monitoring: Secondary | ICD-10-CM | POA: Diagnosis not present

## 2021-07-28 DIAGNOSIS — Z7901 Long term (current) use of anticoagulants: Secondary | ICD-10-CM | POA: Diagnosis not present

## 2021-07-28 LAB — POCT INR: INR: 1.9 — AB (ref 2.0–3.0)

## 2021-07-28 NOTE — Patient Instructions (Signed)
Take 2.5 tablets today only and then Continue with 1 tablet daily except 2 tablets each Monday, Wednesday and Friday.  Repeat INR in 4 weeks

## 2021-08-10 ENCOUNTER — Telehealth: Payer: Self-pay | Admitting: Pharmacist

## 2021-08-10 NOTE — Chronic Care Management (AMB) (Addendum)
    Chronic Care Management Pharmacy Assistant   Name: Adriana Hendricks  MRN: [id number] DOB: 08-17-1935  Reason for Encounter: General Adherence Call    Recent office visits:  None  Recent consult visits:  07/13/2021 OV (pulmonology) Chesley Mires, MD; follow up OSA, no medication changes indicated.  06/18/2021 OV (cardiology) Leonie Man, MD; no medication changes indicated.  Hospital visits:  None in previous 6 months  Medications: Outpatient Encounter Medications as of 08/10/2021  Medication Sig   calcium carbonate (TUMS - DOSED IN MG ELEMENTAL CALCIUM) 500 MG chewable tablet Chew 1 tablet by mouth daily.    calcium-vitamin D (OSCAL WITH D) 500-200 MG-UNIT tablet Take 1 tablet by mouth. PT ISN'T SURE OF THE DOSAGE   cholecalciferol (VITAMIN D3) 25 MCG (1000 UT) tablet Take 1,000 Units by mouth daily.   famotidine (PEPCID) 20 MG tablet Take 20 mg by mouth at bedtime.   furosemide (LASIX) 20 MG tablet Take 1 tablet (20 mg total) by mouth daily. May take an additional 20 mg if needed for swelling or shortness of breath   metoprolol tartrate (LOPRESSOR) 25 MG tablet Take 1 tablet (25 mg total) by mouth as needed.   Multiple Vitamins-Minerals (PRESERVISION AREDS 2 PO) Take 1 capsule by mouth in the morning and at bedtime.   nadolol (CORGARD) 20 MG tablet Take 1 tablet (20 mg total) by mouth daily.   potassium chloride 20 MEQ/15ML (10%) SOLN Take 15 ml by mouth once daily   TURMERIC PO Take 500 mg by mouth 3 (three) times a week.   vitamin B-12 (CYANOCOBALAMIN) 250 MCG tablet Take 250 mcg by mouth daily. PT ISN'T SURE OF THE DOSAGE   warfarin (COUMADIN) 2.5 MG tablet TAKE 1 TO 3 TABLETS DAILY AS DIRECTED BY THE COUMADIN CLINIC   No facility-administered encounter medications on file as of 08/10/2021.   Patient Questions: Have you had any problems recently with your health? Patient states she has not had any problems recently with her health.  Have you had any problems with your  pharmacy? Patient states she has not had any problems recently with her pharmacy.  What issues or side effects are you having with your medications? Patient states she is not currently having any issues or side effects with any of her medications.  What would you like me to pass along to [name], CPP for him to help you with?  Patient states she does not have anything to pass along at this time.  What can we do to take care of you better? Patient did not have any suggestions.  Future Appointments  Date Time Provider [place]  08/25/2021  1:45 PM [facility]  09/08/2021  9:30 AM [facility] ECHO 4 [id number]  11/26/2021  9:40 AM Leonie Man, MD [facility]    Star Rating Drugs: None  April D Calhoun, [place] Pharmacist Assistant [phone]  10 minutes spent in review, coordination, and documentation.  Reviewed by: Deysy Milch, PharmD Clinical Pharmacist [phone]

## 2021-08-25 ENCOUNTER — Other Ambulatory Visit: Payer: Self-pay

## 2021-08-25 ENCOUNTER — Ambulatory Visit (INDEPENDENT_AMBULATORY_CARE_PROVIDER_SITE_OTHER): Payer: Medicare Other

## 2021-08-25 DIAGNOSIS — Z7901 Long term (current) use of anticoagulants: Secondary | ICD-10-CM

## 2021-08-25 DIAGNOSIS — I4819 Other persistent atrial fibrillation: Secondary | ICD-10-CM

## 2021-08-25 LAB — POCT INR: INR: 3.3 — AB (ref 2.0–3.0)

## 2021-08-25 NOTE — Patient Instructions (Signed)
Continue with 1 tablet daily except 2 tablets each Monday, Wednesday and Friday.  Repeat INR in 4 weeks.  Eat greens tonight.

## 2021-09-07 DIAGNOSIS — Z8679 Personal history of other diseases of the circulatory system: Secondary | ICD-10-CM | POA: Diagnosis not present

## 2021-09-07 DIAGNOSIS — R7309 Other abnormal glucose: Secondary | ICD-10-CM | POA: Diagnosis not present

## 2021-09-07 DIAGNOSIS — I071 Rheumatic tricuspid insufficiency: Secondary | ICD-10-CM | POA: Diagnosis not present

## 2021-09-07 DIAGNOSIS — I4819 Other persistent atrial fibrillation: Secondary | ICD-10-CM | POA: Diagnosis not present

## 2021-09-07 DIAGNOSIS — I051 Rheumatic mitral insufficiency: Secondary | ICD-10-CM | POA: Diagnosis not present

## 2021-09-07 DIAGNOSIS — Z9889 Other specified postprocedural states: Secondary | ICD-10-CM | POA: Diagnosis not present

## 2021-09-07 DIAGNOSIS — I10 Essential (primary) hypertension: Secondary | ICD-10-CM | POA: Diagnosis not present

## 2021-09-07 LAB — CBC
Hematocrit: 39.7 % (ref 34.0–46.6)
Hemoglobin: 14 g/dL (ref 11.1–15.9)
MCH: 32.2 pg (ref 26.6–33.0)
MCHC: 35.3 g/dL (ref 31.5–35.7)
MCV: 91 fL (ref 79–97)
Platelets: 174 10*3/uL (ref 150–450)
RBC: 4.35 x10E6/uL (ref 3.77–5.28)
RDW: 11.4 % — ABNORMAL LOW (ref 11.7–15.4)
WBC: 6.1 10*3/uL (ref 3.4–10.8)

## 2021-09-07 LAB — COMPREHENSIVE METABOLIC PANEL
ALT: 18 IU/L (ref 0–32)
AST: 18 IU/L (ref 0–40)
Albumin/Globulin Ratio: 2 (ref 1.2–2.2)
Albumin: 4.7 g/dL — ABNORMAL HIGH (ref 3.6–4.6)
Alkaline Phosphatase: 103 IU/L (ref 44–121)
BUN/Creatinine Ratio: 22 (ref 12–28)
BUN: 20 mg/dL (ref 8–27)
Bilirubin Total: 0.6 mg/dL (ref 0.0–1.2)
CO2: 23 mmol/L (ref 20–29)
Calcium: 9.6 mg/dL (ref 8.7–10.3)
Chloride: 100 mmol/L (ref 96–106)
Creatinine, Ser: 0.89 mg/dL (ref 0.57–1.00)
Globulin, Total: 2.4 g/dL (ref 1.5–4.5)
Glucose: 93 mg/dL (ref 65–99)
Potassium: 4.7 mmol/L (ref 3.5–5.2)
Sodium: 140 mmol/L (ref 134–144)
Total Protein: 7.1 g/dL (ref 6.0–8.5)
eGFR: 64 mL/min/{1.73_m2} (ref >59)

## 2021-09-07 LAB — LIPID PANEL
Chol/HDL Ratio: 3.9 ratio (ref 0.0–4.4)
Cholesterol, Total: 224 mg/dL — ABNORMAL HIGH (ref 100–199)
HDL: 57 mg/dL (ref >39)
LDL Chol Calc (NIH): 134 mg/dL — ABNORMAL HIGH (ref 0–99)
Triglycerides: 189 mg/dL — ABNORMAL HIGH (ref 0–149)
VLDL Cholesterol Cal: 33 mg/dL (ref 5–40)

## 2021-09-07 LAB — HEMOGLOBIN A1C
Est. average glucose Bld gHb Est-mCnc: 117 mg/dL
Hgb A1c MFr Bld: 5.7 % — ABNORMAL HIGH (ref 4.8–5.6)

## 2021-09-07 LAB — TSH: TSH: 3.28 u[IU]/mL (ref 0.450–4.500)

## 2021-09-08 ENCOUNTER — Other Ambulatory Visit: Payer: Self-pay

## 2021-09-08 ENCOUNTER — Ambulatory Visit (HOSPITAL_COMMUNITY): Payer: Medicare Other | Attending: Cardiovascular Disease

## 2021-09-08 DIAGNOSIS — Z952 Presence of prosthetic heart valve: Secondary | ICD-10-CM | POA: Diagnosis not present

## 2021-09-08 DIAGNOSIS — I051 Rheumatic mitral insufficiency: Secondary | ICD-10-CM | POA: Diagnosis not present

## 2021-09-08 DIAGNOSIS — Z8679 Personal history of other diseases of the circulatory system: Secondary | ICD-10-CM | POA: Insufficient documentation

## 2021-09-08 DIAGNOSIS — Z9889 Other specified postprocedural states: Secondary | ICD-10-CM | POA: Diagnosis not present

## 2021-09-08 HISTORY — PX: TRANSTHORACIC ECHOCARDIOGRAM: SHX275

## 2021-09-08 LAB — ECHOCARDIOGRAM COMPLETE
AR max vel: 1.23 cm2
AV Area VTI: 1.27 cm2
AV Area mean vel: 1.21 cm2
AV Mean grad: 8 mmHg
AV Peak grad: 14.4 mmHg
Ao pk vel: 1.9 m/s
Area-P 1/2: 3.39 cm2
MV M vel: 4.74 m/s
MV Peak grad: 89.9 mmHg
MV VTI: 1.1 cm2
Radius: 0.6 cm
S' Lateral: 2.7 cm

## 2021-09-09 DIAGNOSIS — Z23 Encounter for immunization: Secondary | ICD-10-CM | POA: Diagnosis not present

## 2021-09-20 ENCOUNTER — Telehealth: Payer: Self-pay | Admitting: Cardiology

## 2021-09-20 NOTE — Telephone Encounter (Signed)
Left a message for the pt to call back.  

## 2021-09-20 NOTE — Telephone Encounter (Signed)
Patient states she is returning a call.Please advise.

## 2021-09-21 ENCOUNTER — Telehealth: Payer: Self-pay | Admitting: Pulmonary Disease

## 2021-09-21 NOTE — Telephone Encounter (Signed)
I have not recived this CMN they can refax it to [phone]

## 2021-09-21 NOTE — Telephone Encounter (Signed)
I have called [employer] and they have the correct fax number now.  They will fax this over and we will get this completed.

## 2021-09-21 NOTE — Telephone Encounter (Signed)
PCC's did you happen to receive a CMN for the cpap supplies for this pt from [employer]?  Thanks

## 2021-09-22 NOTE — Telephone Encounter (Signed)
LM to call back ./cy 

## 2021-09-23 ENCOUNTER — Other Ambulatory Visit: Payer: Self-pay

## 2021-09-23 ENCOUNTER — Ambulatory Visit (INDEPENDENT_AMBULATORY_CARE_PROVIDER_SITE_OTHER): Payer: Medicare Other | Admitting: *Deleted

## 2021-09-23 DIAGNOSIS — Z5181 Encounter for therapeutic drug level monitoring: Secondary | ICD-10-CM | POA: Diagnosis not present

## 2021-09-23 DIAGNOSIS — I4819 Other persistent atrial fibrillation: Secondary | ICD-10-CM

## 2021-09-23 DIAGNOSIS — Z7901 Long term (current) use of anticoagulants: Secondary | ICD-10-CM | POA: Diagnosis not present

## 2021-09-23 LAB — POCT INR: INR: 2.1 (ref 2.0–3.0)

## 2021-09-23 NOTE — Telephone Encounter (Signed)
Follow Up:      Pt is returning a call from yesterday.

## 2021-09-23 NOTE — Telephone Encounter (Signed)
Spoke with pt she states that she has been playing "telephone tag" since the 20th. She states that she is still waiting for the ECHO results and she states that she had "a donut late the night before" her lab was taken and would like to re-draw her labwork to see if this made a difference. Informed pt that this would not explain the increase of more than 40 points, etc... still she would like a re-draw. Please advise

## 2021-09-23 NOTE — Patient Instructions (Signed)
Description   Continue with 1 tablet daily except 2 tablets each Monday, Wednesday and Friday.  Repeat INR in 5 weeks. Coumadin Clinic [phone]

## 2021-09-23 NOTE — Telephone Encounter (Signed)
I do not see where we called her, LM2CB. Call back if anything is needed

## 2021-09-24 ENCOUNTER — Telehealth: Payer: Self-pay | Admitting: *Deleted

## 2021-09-24 NOTE — Telephone Encounter (Signed)
-----   Message from Leonie Man, MD sent at 09/24/2021 12:33 PM EDT ----- Echocardiogram shows normal/preserved left ventricle function.  The right side and tricuspid valve still look abnormal.  The tricuspid valve again appears to be leaking significantly and this is suggestive of high right-sided pressures.  I have asked one of my other colleagues to review this image.   We should probably have her come in to discuss the results and assess her symptoms.  We may want to consider right heart cath to reevaluate pressures. . I have delayed responding to the results because I have been try to get the right story.  The repaired valve just seems to be leaking more, but we cannot be sure if it is just because of high pressures in the lungs or if it is truly valve disease.  Glenetta Hew, MD  Lets see if we can get her in to be seen to discuss these results. Glenetta Hew, MD

## 2021-09-24 NOTE — Telephone Encounter (Signed)
I am fine with rechecking her labs.  I am reviewing her echocardiogram with Dr. Sallyanne Kuster -> we will respond on the results section.  Difficult echo to review.   Glenetta Hew, MD

## 2021-09-24 NOTE — Telephone Encounter (Signed)
LM2CB 

## 2021-09-24 NOTE — Telephone Encounter (Signed)
Spoke to patient. Appointment is made for Oct 17  at 4:30 pm. Patient states her  labs- she has been eating  the food at [employer]   independent. Living   She also had a doughnut  about 10 hr prior to test.  She wanted to know if test can be retaken, RN informed patient  will discuss at upcoming appointment - usually  recheck is at least 2 months.  Patient verbalized understanding.

## 2021-09-30 NOTE — Telephone Encounter (Signed)
See result note-Echo

## 2021-10-04 NOTE — Progress Notes (Addendum)
Primary Care Provider: Marin Olp, MD Cardiologist: Glenetta Hew, MD Electrophysiologist: None  Clinic Note: Chief Complaint  Patient presents with  . Follow-up    Echo results  . Cardiac Valve Problem    Follow-up echo results: Status post tricuspid valve repair with recurrent tricuspid regurgitation and moderate MR.  On recent echo.      ===================================  ASSESSMENT/PLAN   Problem List Items Addressed This Visit       Cardiology Problems   Rheumatic tricuspid valve regurgitation - Primary (Chronic)    Status post TVR with healthy stable echocardiogram back in August 2020, but current echo result shows progression of  Regurgitation moderate to severe with elevated PA pressures as well as moderate MR.  Interestingly, despite this she is feeling fine and not having any active heart failure symptoms.  As such, it does not seem to make any sense to be more aggressive and simply continue her current medication regimen.  Certainly worsening to have any worsening symptoms, she would need to have reconsideration or evaluation by surgery.  Unfortunately her cardiac surgeon (Dr. Roxy Manns) is no longer here.  Based on the complexity of the tricuspid valve and mitral valve involvement, she may require referral to [facility] if she were to have symptoms.  However, since she is not having symptoms we will continue to follow-up based on her symptoms and recheck an echocardiogram intermittently.  Would not pursue right heart catheterization to evaluate PAP levels without symptoms.  Plan: Continue beta-blocker and diuretic and monitor symptoms. Follow blood pressures, need to make sure that they are adequately controlled. Recheck 2D echocardiogram April 2023       Relevant Orders   ECHOCARDIOGRAM COMPLETE   Rheumatic mitral regurgitation (Chronic)    Moderate MR along with severe TR on echo.  In the absence of symptoms, I think we can simply monitor.  I would imagine that  the mitral valve are getting worse could have led to worsening PA pressures and therefore worsening TR not because of tricuspid dysfunction but more because of elevated PA pressures.  In the absence of symptoms, would not warrant further evaluation besides continued monitoring.  Low threshold to increase afterload reduction.  Follow-up echocardiogram in April 2023      Relevant Orders   ECHOCARDIOGRAM COMPLETE   Hyperlipidemia with target LDL less than 100 (Chronic)    Lipid panel was not very good with LDL of 134.  She would really prefer to avoid regular medications.  She understands that there is risk involved with not treating lipids.  Due to her advanced age and extreme valvular disease, I would minimize therapy and avoid statin.  I suspect that her lipids are not as good now because she is in the retirement community and not eating as well.      Essential hypertension (Chronic)    Blood pressure is high today, but she had not yet taken her nadolol.  Monitor closely because we may want to consider adding afterload reduction based on worsening MR.      Persistent atrial fibrillation (HCC) (Chronic)    History of Maze in June 2020 at the time of her tricuspid valve repair.  She also had left atrial appendage clipping.  Remains on warfarin.  She is on nadolol for rate control and prefer to avoid antiarrhythmics as well as NOACs based on cost. ->   Actually based on rheumatic valvular disease history, warfarin is indicated.  For now continue rate control of nadolol and use occasional  PRN doses of metoprolol.  The seem to be relatively fleeting.  Continue warfarin but okay to hold for procedures - okay without bridging.  Would need to work with warfarin clinic to discuss restarting.      Relevant Orders   ECHOCARDIOGRAM COMPLETE   Bicuspid aortic valve (Chronic)    Previously reported possible functional bicuspid aortic valve was read as normal on this echo.  Not really sure how that  could have changed.  Thankfully, no signs of aortic stenosis.      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  I am sure there is a component of her tricuspid regurgitation causing some edema as well.    Plan: Continue foot elevation, support stockings and low-dose furosemide.        Other   Long term (current) use of anticoagulants: CHA2DS2Vasc 5, WARFARIN - Rheumatic Vavle Disease (Chronic)    CHA2DS2-VASc score is 4 or-5 with a risk of roughly 7.3% stroke per year Remains on warfarin.  Relatively stable.      S/P minimally invasive tricuspid valve repair (Chronic)    It would appear that the valvuloplasty may not have held.  It does not look like there was any flail leaflets or anything.  We will reassess in 2023.  Thankfully she is not having symptoms.      Relevant Orders   ECHOCARDIOGRAM COMPLETE   S/P Maze operation for atrial fibrillation (Chronic)    Remains on warfarin, partly because of esophageal ring but also persistent A. fib.      ===================================  HPI:    Adriana Hendricks is a 86 y.o. female with a PMH notable for H/O SEVERE RHEUMATIC TRICUSPID REGURGITATION - complicated by [place] Trap (s/p TVR/MAZE&LAA CLIP 06/262 - complicated by post-op Pleural Effusions w/ Thoracentesis x 2) & Chronic Dyspnea who presents today for 3-4 month f/u to discuss results of recent Echocardiogram.  Adriana Hendricks was last seen on June 18, 2021 for annual follow-up. -She states that she felt her exertional dyspnea was at its baseline.  She is active and happy with her living situation.  Exercising routinely.  No recurrent A. fib since her Maze Ablation.  On low-dose nadolol.  On warfarin.  Noted maybe 3-4 episodes of fast heart rates in the preceding year. 2D echocardiogram ordered to reassess valvular heart disease. Usually takes 40 mg  Lasix daily but once or twice a week will take an additional 20 mg.  Recent Hospitalizations: None  Reviewed  CV studies:    The following studies were reviewed today: (if available, images/films reviewed: From Epic Chart or Care Everywhere) TTE 09/08/2021: EF 60 to 65%.  No R WMA.  Unable to assess diastolic function.  Only mild LA dilation.  Severely elevated PAP-64.5 mmHg.  Mild to moderate RA dilation.  Moderate MR.  Repaired TV has moderate-severe TR  Interval History:   Adriana Hendricks returns for follow-up to discuss results of her echocardiogram but she says she is feeling really good.  She is not having any problems at all with dyspnea or chest tightness/pressure.  She is not that active but is enjoying being at the retirement community [facility]) --probably eating more than she should.  But she is getting more exercise because of all the activities.  Staying very very involved.  Maybe 3 times in the last 3 months she has had to  take an additional metoprolol either for palpitations or for higher than usual blood pressure.  She really denies any heart failure symptoms of PND, orthopnea or edema.  Really denies any exertional dyspnea either.  She is on her standing 20 mg dose of Lasix and has not really had to use any additional doses maybe once after she ate something that was high in salt.   She had a rush to get here today and forgot to take her medicine but usually her blood pressures at home range from 130-140/70 when checked by the nurses there.  She says that if she does not wear her mask, she is fine with no dyspnea.  Still using CPAP  CV Review of Symptoms (Summary) Cardiovascular ROS: positive for - irregular heartbeat, palpitations, and very trivial edema.  Palpitations are just a few skipped beats.  Nothing prolonged.  Only 3 additional doses of metoprolol taken.  Mild venous congestion in legs lead to aching. negative for - chest pain, dyspnea on exertion, orthopnea, paroxysmal  nocturnal dyspnea, rapid heart rate, shortness of breath, or lightheadedness, dizziness or wooziness, syncope/near syncope or TIA/amaurosis fugax, claudication  REVIEWED OF SYSTEMS   Review of Systems  Constitutional:  Negative for malaise/fatigue (Energy level is indeed better.  Enjoying be more active.) and weight loss.  HENT:  Negative for nosebleeds.   Respiratory: Negative.    Cardiovascular:  Positive for palpitations. Negative for leg swelling.  Gastrointestinal:  Negative for blood in stool.  Genitourinary:  Negative for hematuria.  Musculoskeletal:  Positive for myalgias (Her legs mostly in the lower legs ache worse on the right.  She does have some mild varicose veins swelling and engorgement.).  Neurological:  Negative for dizziness, focal weakness and weakness.  Psychiatric/Behavioral:  Negative for depression and memory loss. The patient is not nervous/anxious (Notably less anxious).    I have reviewed and (if needed) personally updated the patient's problem list, medications, allergies, past medical and surgical history, social and family history.   PAST MEDICAL HISTORY   Past Medical History:  Diagnosis Date  . Achilles tendon rupture   . Arthritis of hand, right   . Atrial flutter with rapid ventricular response ([place])   . Carpal tunnel syndrome 09/04/2013  . Collagenous colitis 2005  . Colon polyps 2010   Tubular adenoma and hyperplastic  . Dental infection 10/25/2014  . Diverticulosis   . Esophageal stenosis   . Essential tremor   . GERD (gastroesophageal reflux disease)    hx hiatal hernia, hx esophagitis, hx stricture  . Hiatal hernia   . Hypertension   . Lower extremity neuropathy 08/23/2013   On B12 therapy with numbness in the feet bilaterally no evidence of diabetes   . Ocular migraine    jagged vision, a few per month  . OSA (obstructive sleep apnea) 12/21/2016   on CPAP  . Peripheral neuropathy    treated by Dr Posey Pronto (07/2015)  . Persistent atrial  fibrillation (HCC)    Rate control with beta-blocker and anticoagulated with warfarin  . Pleural effusion, left   . S/P Maze operation for atrial fibrillation 06/12/2019   Complete bilateral atrial lesion set using cryothermy and bipolar radiofrequency ablation with clipping of LA appendage via right mini thoracotomy approach  . S/P minimally invasive tricuspid valve repair 06/12/2019   Complex valvuloplasty including autologous pericardial patch augmentation of anterior and posterior leaflets with 28 mm Edwards mc3 ring annuloplasty via right mini thoracotomy approach  . Severe tricuspid regurgitation 07/2018  Noted Aug 2019 during a fib workup. Severe LAE as well  . Sinus node dysfunction (HCC)     PAST SURGICAL HISTORY   Past Surgical History:  Procedure Laterality Date  . 48 hr Holter Monitor  07/2018   Persistent Afib (rate 33 - 124 bpm)  . APPENDECTOMY    . CARDIAC CATHETERIZATION    . CARDIOVERSION N/A 10/02/2018   Procedure: CARDIOVERSION;  Surgeon: Acie Fredrickson, Wonda Cheng, MD;  Location: [facility] ENDOSCOPY;  Service: Cardiovascular;  Laterality: N/A;  . CARPAL TUNNEL RELEASE    . CATARACT EXTRACTION    . EXCISION MORTON'S NEUROMA     Right foot  . EYE SURGERY     Cataracts with implants-bilateral  . INTRAOCULAR LENS IMPLANT, SECONDARY    . IR THORACENTESIS ASP PLEURAL SPACE W/IMG GUIDE  07/10/2019  . IR THORACENTESIS ASP PLEURAL SPACE W/IMG GUIDE  07/10/2019  . IR THORACENTESIS ASP PLEURAL SPACE W/IMG GUIDE  07/26/2019  . MINIMALLY INVASIVE MAZE PROCEDURE N/A 06/12/2019   Procedure: MINIMALLY INVASIVE MAZE PROCEDURE;  Surgeon: Rexene Alberts, MD;  Location: [facility];  Service: Open Heart Surgery;  Laterality: N/A;  . MINIMALLY INVASIVE TRICUSPID VALVE REPAIR Right 06/12/2019   Procedure: MINIMALLY INVASIVE TRICUSPID VALVE REPAIR USING EDWARDS MC3 T28 ANNULOPLASTY RING, INSERTION TEMPORARY TRANSVENOUS AV PACING LEAD;  Surgeon: Rexene Alberts, MD;  Location: [place];  Service: Open Heart  Surgery;  Laterality: Right;  . Moles removed    . MOUTH SURGERY     (For Exostosis)  . NUCLEAR  07/2018   EF 71 %. LOW RISK - no ischemia or Infarct.   Marland Kitchen RIGHT/LEFT HEART CATH AND CORONARY ANGIOGRAPHY N/A 01/16/2019   Procedure: RIGHT/LEFT HEART CATH AND CORONARY ANGIOGRAPHY;  Surgeon: Leonie Man, MD;  Location: [place] CV LAB;; Angiographically normal coronary arteries.  Normal LVEDP. Upper Limit of Normal PAP~23 mmHg & PCWP 18 mmHg.  LVEDP of 7 mmHg. -With mean PAP of 23 mmHg there is a TPG suggesting a primary pulmonary etiology.  Marland Kitchen ROTATOR CUFF REPAIR    . TEE WITHOUT CARDIOVERSION N/A 01/16/2019   Procedure: TRANSESOPHAGEAL ECHOCARDIOGRAM (TEE);  Surgeon: Sueanne Margarita, MD;  Location: [facility] ENDOSCOPY;;  Severe TR due to poor coaptation of the leaflets from annular dilation.  Mild to moderate mitral vegetation with mildly restricted mobility of the posterior leaflet.  EF 55-60% with no R WMA.  Severe RA and LA dilation.  . TEE WITHOUT CARDIOVERSION N/A 06/12/2019   Procedure: TRANSESOPHAGEAL ECHOCARDIOGRAM (TEE);  Surgeon: Rexene Alberts, MD;  Location: [place];  Service: Open Heart Surgery;  Laterality: N/A;  . TONSILLECTOMY    . TRANSTHORACIC ECHOCARDIOGRAM  08/05/2019   First postop TAVR: EF 60 to 65%. Mod concentric LVH. Grade III DD (? w/ Mild LA dilation).  Rheumatic MV w/ mod thickening & Ca2+.  Anterior leaflet has mild doming but no MVP.  Mild-mod MR and mild to mod MS (mean MVA gradient 7 mmHg).  ~ functional bicuspid AoV w/ Mod Sclerosis - no AS.  Thoracic Aorta (4.1 & 3.7 @ Sinus of  V, prox Ascending) - mild dilation  . TRANSTHORACIC ECHOCARDIOGRAM  09/08/2021   EF 60 to 65%.  No R WMA.  Unable to assess diastolic function.  Only mild LA dilation.  Severely elevated PAP-64.5 mmHg.  Mild to moderate RA dilation.  Moderate MR.  Repaired TV has moderate-severe TR    Immunization History  Administered Date(s) Administered  . Fluad Quad(high Dose 65+) 10/08/2019,  10/12/2020, 10/13/2021  .  Influenza Split 12/01/2011, 11/05/2012  . Influenza Whole 01/07/2010, 10/08/2010  . Influenza, High Dose Seasonal PF 10/01/2014, 11/10/2015, 10/25/2016, 10/30/2017, 10/22/2018  . Influenza,inj,Quad PF,6+ Mos 08/23/2013  . PFIZER(Purple Top)SARS-COV-2 Vaccination 01/04/2020, 01/25/2020, 09/22/2020, 04/08/2021  . Pension scheme manager 17yrs & up 09/09/2021  . Pneumococcal Conjugate-13 04/13/2015  . Pneumococcal Polysaccharide-23 12/26/2005  . Td 12/26/2001  . Tdap 01/23/2012  . Zoster Recombinat (Shingrix) 12/02/2020, 05/21/2021  . Zoster, Live 02/08/2010    MEDICATIONS/ALLERGIES   Current Meds  Medication Sig  . calcium carbonate (TUMS - DOSED IN MG ELEMENTAL CALCIUM) 500 MG chewable tablet Chew 1 tablet by mouth daily.   . cholecalciferol (VITAMIN D3) 25 MCG (1000 UT) tablet Take 1,000 Units by mouth daily.  . famotidine (PEPCID) 20 MG tablet Take 20 mg by mouth at bedtime.  . metoprolol tartrate (LOPRESSOR) 25 MG tablet Take 1 tablet (25 mg total) by mouth as needed.  . Multiple Vitamins-Minerals (PRESERVISION AREDS 2 PO) Take 1 capsule by mouth in the morning and at bedtime.  . nadolol (CORGARD) 20 MG tablet Take 1 tablet (20 mg total) by mouth daily.  . potassium chloride 20 MEQ/15ML (10%) SOLN Take 15 ml by mouth once daily  . TURMERIC PO Take 500 mg by mouth 3 (three) times a week.  . vitamin B-12 (CYANOCOBALAMIN) 250 MCG tablet Take 250 mcg by mouth daily. PT ISN'T SURE OF THE DOSAGE  . warfarin (COUMADIN) 2.5 MG tablet TAKE 1 TO 3 TABLETS DAILY AS DIRECTED BY THE COUMADIN CLINIC  . [DISCONTINUED] calcium-vitamin D (OSCAL WITH D) 500-200 MG-UNIT tablet Take 1 tablet by mouth. PT ISN'T SURE OF THE DOSAGE    Allergies  Allergen Reactions  . Levofloxacin Other (See Comments)    Developed tendon pain. (Has a history of a Achilles tendon tear)  . Potassium Chloride Hives  . Clarithromycin Nausea Only  . Erythromycin Base Nausea And  Vomiting  . Metronidazole Rash  . Penicillins Rash and Other (See Comments)    Did it involve swelling of the face/tongue/throat, SOB, or low BP? No Did it involve sudden or severe rash/hives, skin peeling, or any reaction on the inside of your mouth or nose? No Did you need to seek medical attention at a hospital or doctor's office? No When did it last happen?  as a child If all above answers are "NO", may proceed with cephalosporin use.     SOCIAL HISTORY/FAMILY HISTORY   Reviewed in Epic:  Pertinent findings:  Social History   Tobacco Use  . Smoking status: Former    Packs/day: 2.00    Years: 13.00    Pack years: 26.00    Types: Cigarettes    Start date: 12/26/1954    Quit date: 03/26/1968    Years since quitting: 53.6  . Smokeless tobacco: Never  . Tobacco comments:    Quit in 1969 - smokeed 2-3PPD , was 3 PPD at her heaviest for about 3 years.   Vaping Use  . Vaping Use: Never used  Substance Use Topics  . Alcohol use: Yes    Alcohol/week: 2.0 standard drinks    Types: 2 Glasses of wine per week    Comment: 2 glass of wine per day  . Drug use: No   Social History   Social History Narrative   Married. Husband is patient of Dr. Thurnell Garbe   Married 1958. 2 children. 4 grandkids (3 girls, 1 boy).    Retired from [employer].  Hobbies: genealogy and DNA   Right handed    One story      Recently moved to a retirement community in [place] (spring 2022).  Taking advantage of the fact that she does not have to cook every day.  Is also taking advantage of the multiple daily exercise classes.  She tries to do classes at least 3 days a week if not more.  She is also doing much more walking around the community.      She and her husband are in the process of selling the house.  Hopefully close in July 2022    OBJCTIVE -PE, EKG, labs   Wt Readings from Last 3 Encounters:  10/26/21 171 lb 4 oz (77.7 kg)  10/05/21 172 lb 3.2 oz (78.1 kg)   06/18/21 171 lb 6.4 oz (77.7 kg)    Physical Exam: BP (!) 160/82   Pulse 75   Ht 5\' 5"  (1.651 m)   Wt 172 lb 3.2 oz (78.1 kg)   LMP  (LMP Unknown)   SpO2 97%   BMI 28.66 kg/m  Physical Exam Constitutional:      General: She is not in acute distress.    Appearance: Normal appearance. She is normal weight. She is not ill-appearing (Healthy-appearing) or diaphoretic.  HENT:     Head: Normocephalic and atraumatic.  Neck:     Vascular: No carotid bruit, hepatojugular reflux or JVD.  Cardiovascular:     Rate and Rhythm: Normal rate and regular rhythm. No extrasystoles are present.    Chest Wall: PMI is not displaced.     Pulses: Normal pulses and intact distal pulses.     Heart sounds: Murmur heard.  High-pitched harsh crescendo-decrescendo early systolic murmur is present with a grade of 1/6 at the upper right sternal border radiating to the neck.    No gallop.  Pulmonary:     Effort: Pulmonary effort is normal. No respiratory distress.     Breath sounds: Normal breath sounds. No wheezing, rhonchi or rales.  Musculoskeletal:        General: Swelling (Trivial/mild with both ankles having spider/varicose veins.) present.     Cervical back: Normal range of motion and neck supple.  Skin:    General: Skin is warm and dry.  Neurological:     General: No focal deficit present.     Mental Status: She is alert and oriented to person, place, and time.  Psychiatric:        Mood and Affect: Mood normal.        Behavior: Behavior normal.        Thought Content: Thought content normal.        Judgment: Judgment normal.    Adult ECG Report N/a  Recent Labs: Reviewed.  Has not had labs checked since last April. Lab Results  Component Value Date   CHOL 224 (H) 09/07/2021   HDL 57 09/07/2021   LDLCALC 134 (H) 09/07/2021   LDLDIRECT 128.8 01/17/2011   TRIG 189 (H) 09/07/2021   CHOLHDL 3.9 09/07/2021   Lab Results  Component Value Date   CREATININE 0.89 09/07/2021   BUN 20  09/07/2021   NA 140 09/07/2021   K 4.7 09/07/2021   CL 100 09/07/2021   CO2 23 09/07/2021   CBC Latest Ref Rng & Units 09/07/2021 04/02/2020 01/03/2020  WBC 3.4 - 10.8 x10E3/uL 6.1 8.6 7.8  Hemoglobin 11.1 - 15.9 g/dL 14.0 13.7 15.7  Hematocrit 34.0 - 46.6 % 39.7 39.7 47.0(H)  Platelets  150 - 450 x10E3/uL 174 166.0 191    Lab Results  Component Value Date   TSH 3.280 09/07/2021    ==================================================  COVID-19 Education: The signs and symptoms of COVID-19 were discussed with the patient and how to seek care for testing (follow up with PCP or arrange E-visit).    I spent a total of 23  minutes with the patient spent in direct patient consultation.  Additional time spent with chart review  / charting (studies, outside notes, etc): 25 min Total Time: 48 min  Current medicines are reviewed at length with the patient today.  (+/- concerns) n/a  This visit occurred during the SARS-CoV-2 public health emergency.  Safety protocols were in place, including screening questions prior to the visit, additional usage of staff PPE, and extensive cleaning of exam room while observing appropriate contact time as indicated for disinfecting solutions.  Notice: This dictation was prepared with Dragon dictation along with smaller phrase technology. Any transcriptional errors that result from this process are unintentional and may not be corrected upon review.  Patient Instructions / Medication Changes & Studies & Tests Ordered   Patient Instructions  Medication Instructions:  No changes    Lab Work: Not needed.   Testing/Procedures:   Will be schedule for April 2023 Your physician has requested that you have an echocardiogram. Echocardiography is a painless test that uses sound waves to create images of your heart. It provides your doctor with information about the size and shape of your heart and how well your heart's chambers and valves are working. This  procedure takes approximately one hour. There are no restrictions for this procedure.    Follow-Up: At [facility], you and your health needs are our priority.  As part of our continuing mission to provide you with exceptional heart care, we have created designated Provider Care Teams.  These Care Teams include your primary Cardiologist (physician) and Advanced Practice Providers (APPs -  Physician Assistants and Nurse Practitioners) who all work together to provide you with the care you need, when you need it.     Your next appointment:   6 month(s)  The format for your next appointment:   In Person  Provider:   Glenetta Hew, MD   Other Instructions    Studies Ordered:   Orders Placed This Encounter  Procedures  . ECHOCARDIOGRAM COMPLETE      Glenetta Hew, M.D., M.S. Interventional Cardiologist   Pager # [phone] Phone # [phone] [address] [place], [place] [address]   Thank you for choosing Heartcare at [facility]!!

## 2021-10-05 ENCOUNTER — Other Ambulatory Visit: Payer: Self-pay

## 2021-10-05 ENCOUNTER — Encounter: Payer: Self-pay | Admitting: Cardiology

## 2021-10-05 ENCOUNTER — Ambulatory Visit (INDEPENDENT_AMBULATORY_CARE_PROVIDER_SITE_OTHER): Payer: Medicare Other | Admitting: Cardiology

## 2021-10-05 VITALS — BP 160/82 | HR 75 | Ht 65.0 in | Wt 172.2 lb

## 2021-10-05 DIAGNOSIS — Z9889 Other specified postprocedural states: Secondary | ICD-10-CM

## 2021-10-05 DIAGNOSIS — E785 Hyperlipidemia, unspecified: Secondary | ICD-10-CM | POA: Diagnosis not present

## 2021-10-05 DIAGNOSIS — Q231 Congenital insufficiency of aortic valve: Secondary | ICD-10-CM | POA: Diagnosis not present

## 2021-10-05 DIAGNOSIS — I071 Rheumatic tricuspid insufficiency: Secondary | ICD-10-CM | POA: Diagnosis not present

## 2021-10-05 DIAGNOSIS — Z7901 Long term (current) use of anticoagulants: Secondary | ICD-10-CM

## 2021-10-05 DIAGNOSIS — I4819 Other persistent atrial fibrillation: Secondary | ICD-10-CM

## 2021-10-05 DIAGNOSIS — I051 Rheumatic mitral insufficiency: Secondary | ICD-10-CM

## 2021-10-05 DIAGNOSIS — I83893 Varicose veins of bilateral lower extremities with other complications: Secondary | ICD-10-CM

## 2021-10-05 DIAGNOSIS — I10 Essential (primary) hypertension: Secondary | ICD-10-CM | POA: Diagnosis not present

## 2021-10-05 DIAGNOSIS — Z8679 Personal history of other diseases of the circulatory system: Secondary | ICD-10-CM | POA: Diagnosis not present

## 2021-10-05 NOTE — Patient Instructions (Signed)
Medication Instructions:  No changes    Lab Work: Not needed.   Testing/Procedures:   Will be schedule for April 2023 Your physician has requested that you have an echocardiogram. Echocardiography is a painless test that uses sound waves to create images of your heart. It provides your doctor with information about the size and shape of your heart and how well your heart's chambers and valves are working. This procedure takes approximately one hour. There are no restrictions for this procedure.    Follow-Up: At [facility], you and your health needs are our priority.  As part of our continuing mission to provide you with exceptional heart care, we have created designated Provider Care Teams.  These Care Teams include your primary Cardiologist (physician) and Advanced Practice Providers (APPs -  Physician Assistants and Nurse Practitioners) who all work together to provide you with the care you need, when you need it.     Your next appointment:   6 month(s)  The format for your next appointment:   In Person  Provider:   Glenetta Hew, MD   Other Instructions

## 2021-10-11 ENCOUNTER — Ambulatory Visit: Payer: Medicare Other | Admitting: Cardiology

## 2021-10-13 ENCOUNTER — Other Ambulatory Visit: Payer: Self-pay

## 2021-10-13 ENCOUNTER — Ambulatory Visit (INDEPENDENT_AMBULATORY_CARE_PROVIDER_SITE_OTHER): Payer: Medicare Other

## 2021-10-13 DIAGNOSIS — Z23 Encounter for immunization: Secondary | ICD-10-CM

## 2021-10-22 NOTE — Progress Notes (Signed)
Phone [phone] In person visit   Subjective:   Adriana Hendricks is a 86 y.o. year old very pleasant female patient who presents for/with See problem oriented charting Chief Complaint  Patient presents with   Hypertension   Gastroesophageal Reflux    This visit occurred during the SARS-CoV-2 public health emergency.  Safety protocols were in place, including screening questions prior to the visit, additional usage of staff PPE, and extensive cleaning of exam room while observing appropriate contact time as indicated for disinfecting solutions.   Past Medical History-  Patient Active Problem List   Diagnosis Date Noted   Sinus node dysfunction ([place])     Priority: High   S/P minimally invasive tricuspid valve repair 06/12/2019    Priority: High   S/P Maze operation for atrial fibrillation 06/12/2019    Priority: High   Severe tricuspid regurgitation     Priority: High   Persistent atrial fibrillation ([place])     Priority: High   Osteopenia 03/13/2017    Priority: High   Bicuspid aortic valve 01/28/2019    Priority: Medium    DOE (dyspnea on exertion) 07/26/2018    Priority: Medium    Ocular migraine 03/13/2017    Priority: Medium    OSA (obstructive sleep apnea) 12/21/2016    Priority: Medium    Lower extremity neuropathy 08/23/2013    Priority: Medium    Hyperlipidemia with target LDL less than 100 01/07/2010    Priority: Medium    GERD with stricture 06/19/2009    Priority: Medium    Essential tremor 11/16/2007    Priority: Medium    Essential hypertension 11/16/2007    Priority: Medium    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    Priority: Low   Tricuspid valve insufficiency     Priority: Low   Long term (current) use of anticoagulants: CHA2DS2Vasc 5, WARFARIN - Rheumatic Vavle Disease 08/28/2018    Priority: Low   IBS (irritable bowel syndrome) 09/12/2016    Priority: Low   History of adenomatous polyp of colon 09/12/2016    Priority: Low   Former  smoker 09/12/2016    Priority: Low   Carpal tunnel syndrome 09/04/2013    Priority: Low   Achilles tendon tear 01/17/2011    Priority: Low   ACNE ROSACEA 04/03/2008    Priority: Low   Varicose veins of lower extremity with edema 06/25/2021   Palpitations 11/10/2019   Pleural effusion, left     Medications- reviewed and updated Current Outpatient Medications  Medication Sig Dispense Refill   calcium carbonate (TUMS - DOSED IN MG ELEMENTAL CALCIUM) 500 MG chewable tablet Chew 1 tablet by mouth daily.      cholecalciferol (VITAMIN D3) 25 MCG (1000 UT) tablet Take 1,000 Units by mouth daily.     famotidine (PEPCID) 20 MG tablet Take 20 mg by mouth at bedtime.     metoprolol tartrate (LOPRESSOR) 25 MG tablet Take 1 tablet (25 mg total) by mouth as needed. 20 tablet 6   Multiple Vitamins-Minerals (PRESERVISION AREDS 2 PO) Take 1 capsule by mouth in the morning and at bedtime.     nadolol (CORGARD) 20 MG tablet Take 1 tablet (20 mg total) by mouth daily. 90 tablet 3   potassium chloride 20 MEQ/15ML (10%) SOLN Take 15 ml by mouth once daily 450 mL 6   TURMERIC PO Take 500 mg by mouth 3 (three) times a week.     vitamin B-12 (CYANOCOBALAMIN) 250 MCG tablet Take 250 mcg by  mouth daily. PT ISN'T SURE OF THE DOSAGE     warfarin (COUMADIN) 2.5 MG tablet TAKE 1 TO 3 TABLETS DAILY AS DIRECTED BY THE COUMADIN CLINIC 60 tablet 0   furosemide (LASIX) 20 MG tablet Take 1 tablet (20 mg total) by mouth daily. May take an additional 20 mg if needed for swelling or shortness of breath 110 tablet 3   No current facility-administered medications for this visit.     Objective:  BP 124/76 (BP Location: Left Arm, Patient Position: Sitting, Cuff Size: Normal)   Pulse 61   Temp (!) 97.2 F (36.2 C) (Temporal)   Ht 5\' 5"  (1.651 m)   Wt 171 lb 4 oz (77.7 kg)   LMP  (LMP Unknown)   SpO2 96%   BMI 28.50 kg/m  Gen: NAD, resting comfortably TM normal after remvoing slight cerumen in left ear CV: RRR faint  murmur  CTAB no crackles, wheeze, rhonchi Abdomen: soft/nontender/nondistended/normal bowel sounds. No rebound or guarding.  Ext: no edema Skin: warm, dr    Assessment and Plan   # Increased leaking of tricuspid valve- plan is to repeat in 6 months from September visit. Repaired valve is simply leaking more. Working with Dr. [name]   #hypertension S: compliant with  lasix 20mg  daily (on potassium with this- doing liquid), nadolol 40mg  daily and  metoprolol 25 mg as needed Home readings #s: 110-138/60s to 80s BP Readings from Last 3 Encounters:  10/26/21 124/76  10/05/21 (!) 160/82  07/13/21 126/82  A/P:  Controlled. Continue current medications.     Persistent atrial fibrillation (HCC) #Sinus node dysfunction S: Per most recent cardiology notes appears to be remained out of atrial fibrillation/flutter.  She had a history of Maze procedure June 2020 and left atrial appendage clipped.  She was on nadolol for rate control.  She remained on Coumadin-per cardiology notes also for concern of mitral valve disease.   A/P: Appropriately anticoagulated and rate controlled in regards to atrial fibrillation without recurrence after Maze procedure and left atrial appendage clipping.  For sinus node dysfunction-doing well on nadolol-has metoprolol available as needed perhaps 3-4 x since surgery in 2020  Essential tremor S: Nadolol been helpful for patient for essential tremor. unchanged. Still some issues with eating and uses 2 hands to drink A/P: stable overall- continue current meds  # GERD S:Medication: pepcid 20 mg at bedtime  A/P: Reasonable control-continue current medication   #Postoperative iron deficiency anemia after prior surgery -Noted in distant past.  Last CBC was normal.  Lab Results  Component Value Date   WBC 6.1 09/07/2021   HGB 14.0 09/07/2021   HCT 39.7 09/07/2021   MCV 91 09/07/2021   PLT 174 09/07/2021   # Hyperglycemia/insulin resistance/prediabetes S:   Medication: none Exercise and diet- reports 7 months of delicious desserts at [place] led to a1c increase- last time it had been high was 8 years ago. Since that time checking a1c has cut back and anticipating improved #s Lab Results  Component Value Date   HGBA1C 5.7 (H) 09/07/2021   HGBA1C 5.4 06/10/2019   HGBA1C 5.6 08/05/2015   A/P: hopefully improving- too soon for repeat- consider next visit  #hyperlipidemia S: Medication:none  Lab Results  Component Value Date   CHOL 224 (H) 09/07/2021   HDL 57 09/07/2021   LDLCALC 134 (H) 09/07/2021   LDLDIRECT 128.8 01/17/2011   TRIG 189 (H) 09/07/2021   CHOLHDL 3.9 09/07/2021   A/P: elevations but over age 19 would not start  medicine for primary prevention  Recommended follow up: Return in about 6 months (around 04/25/2022) for Follow up or sooner if needed. Future Appointments  Date Time Provider [place]  10/28/2021  3:00 PM [facility]  03/30/2022 10:20 AM [id number]    Lab/Order associations:   ICD-10-CM   1. Essential hypertension  I10     2. Essential tremor  G25.0     3. Hyperlipidemia with target LDL less than 100  E78.5     4. GERD with stricture  K21.9    K22.2     5. Persistent atrial fibrillation (HCC)  I48.19     6. Anemia, unspecified type  D64.9      I,Jada Bradford,acting as a scribe for Garret Reddish, MD.,have documented all relevant documentation on the behalf of Garret Reddish, MD,as directed by  Garret Reddish, MD while in the presence of Garret Reddish, MD.  I, Garret Reddish, MD, have reviewed all documentation for this visit. The documentation on 10/26/21 for the exam, diagnosis, procedures, and orders are all accurate and complete.  Return precautions advised.  Garret Reddish, MD

## 2021-10-26 ENCOUNTER — Encounter: Payer: Self-pay | Admitting: Family Medicine

## 2021-10-26 ENCOUNTER — Ambulatory Visit (INDEPENDENT_AMBULATORY_CARE_PROVIDER_SITE_OTHER): Payer: Medicare Other | Admitting: Family Medicine

## 2021-10-26 ENCOUNTER — Other Ambulatory Visit: Payer: Self-pay

## 2021-10-26 VITALS — BP 124/76 | HR 61 | Temp 97.2°F | Ht 65.0 in | Wt 171.2 lb

## 2021-10-26 DIAGNOSIS — E785 Hyperlipidemia, unspecified: Secondary | ICD-10-CM

## 2021-10-26 DIAGNOSIS — I10 Essential (primary) hypertension: Secondary | ICD-10-CM

## 2021-10-26 DIAGNOSIS — R739 Hyperglycemia, unspecified: Secondary | ICD-10-CM

## 2021-10-26 DIAGNOSIS — K219 Gastro-esophageal reflux disease without esophagitis: Secondary | ICD-10-CM

## 2021-10-26 DIAGNOSIS — D649 Anemia, unspecified: Secondary | ICD-10-CM

## 2021-10-26 DIAGNOSIS — I4819 Other persistent atrial fibrillation: Secondary | ICD-10-CM | POA: Diagnosis not present

## 2021-10-26 DIAGNOSIS — K222 Esophageal obstruction: Secondary | ICD-10-CM | POA: Diagnosis not present

## 2021-10-26 DIAGNOSIS — G250 Essential tremor: Secondary | ICD-10-CM | POA: Diagnosis not present

## 2021-10-26 NOTE — Patient Instructions (Addendum)
I am glad you're motivated to exercise! Please try to exercise 150 minutes per week. (30 minutes a day)!   Thank Dr. [name] for me for doing all the heavy lifting on the labs- I do not have anymore to add today!   Recommended follow up: Return in about 6 months (around 04/25/2022) for Follow up or sooner if needed.

## 2021-10-28 ENCOUNTER — Other Ambulatory Visit: Payer: Self-pay

## 2021-10-28 ENCOUNTER — Ambulatory Visit (INDEPENDENT_AMBULATORY_CARE_PROVIDER_SITE_OTHER): Payer: Medicare Other

## 2021-10-28 DIAGNOSIS — Z7901 Long term (current) use of anticoagulants: Secondary | ICD-10-CM | POA: Diagnosis not present

## 2021-10-28 DIAGNOSIS — I4819 Other persistent atrial fibrillation: Secondary | ICD-10-CM

## 2021-10-28 LAB — POCT INR: INR: 2.6 (ref 2.0–3.0)

## 2021-10-28 NOTE — Patient Instructions (Signed)
Continue with 1 tablet daily except 2 tablets each Monday, Wednesday and Friday.  Repeat INR in 6 weeks. [facility] [phone]

## 2021-10-31 ENCOUNTER — Encounter: Payer: Self-pay | Admitting: Cardiology

## 2021-10-31 NOTE — Assessment & Plan Note (Signed)
Moderate MR along with severe TR on echo.  In the absence of symptoms, I think we can simply monitor.  I would imagine that the mitral valve are getting worse could have led to worsening PA pressures and therefore worsening TR not because of tricuspid dysfunction but more because of elevated PA pressures.  In the absence of symptoms, would not warrant further evaluation besides continued monitoring.  Low threshold to increase afterload reduction.  Follow-up echocardiogram in April 2023

## 2021-10-31 NOTE — Assessment & Plan Note (Signed)
History of Maze in June 2020 at the time of her tricuspid valve repair.  She also had left atrial appendage clipping.  Remains on warfarin.  She is on nadolol for rate control and prefer to avoid antiarrhythmics as well as NOACs based on cost. ->   Actually based on rheumatic valvular disease history, warfarin is indicated.  For now continue rate control of nadolol and use occasional PRN doses of metoprolol.  The seem to be relatively fleeting.  Continue warfarin but okay to hold for procedures - okay without bridging.  Would need to work with warfarin clinic to discuss restarting.

## 2021-10-31 NOTE — Assessment & Plan Note (Signed)
Status post TVR with healthy stable echocardiogram back in August 2020, but current echo result shows progression of  Regurgitation moderate to severe with elevated PA pressures as well as moderate MR.  Interestingly, despite this she is feeling fine and not having any active heart failure symptoms.  As such, it does not seem to make any sense to be more aggressive and simply continue her current medication regimen.  Certainly worsening to have any worsening symptoms, she would need to have reconsideration or evaluation by surgery.  Unfortunately her cardiac surgeon (Dr. Roxy Manns) is no longer here.  Based on the complexity of the tricuspid valve and mitral valve involvement, she may require referral to [facility] if she were to have symptoms.  However, since she is not having symptoms we will continue to follow-up based on her symptoms and recheck an echocardiogram intermittently.  Would not pursue right heart catheterization to evaluate PAP levels without symptoms.  Plan: Continue beta-blocker and diuretic and monitor symptoms.  Follow blood pressures, need to make sure that they are adequately controlled.  Recheck 2D echocardiogram April 2023

## 2021-10-31 NOTE — Assessment & Plan Note (Signed)
Pretty well controlled on standard low-dose of Lasix.  I am sure there is a component of her tricuspid regurgitation causing some edema as well.    Plan: Continue foot elevation, support stockings and low-dose furosemide.

## 2021-10-31 NOTE — Assessment & Plan Note (Signed)
CHA2DS2-VASc score is 4 or-5 with a risk of roughly 7.3% stroke per year Remains on warfarin.  Relatively stable.

## 2021-10-31 NOTE — Assessment & Plan Note (Deleted)
Status post TAVR with healthy stable echocardiogram back in August 2020, but current echo result shows progression of  Regurgitation moderate to severe with elevated PA pressures as well as moderate MR.  Interestingly, despite this she is feeling fine and not having any active heart failure symptoms.  As such, it does not seem to make any sense to be more aggressive and simply continue her current medication regimen.  Certainly worsening to have any worsening symptoms, she would need to have reconsideration or evaluation by surgery.  Unfortunately her cardiac surgeon (Dr. Roxy Manns) is no longer here.  Based on the complexity of the tricuspid valve and mitral valve involvement, she may require referral to [facility] if she were to have symptoms.  However, since she is not having symptoms we will continue to follow-up based on her symptoms and recheck an echocardiogram intermittently.  Would not pursue right heart catheterization to evaluate PAP levels without symptoms.  Plan: Continue beta-blocker and diuretic and monitor symptoms.  Follow blood pressures, need to make sure that they are adequately controlled.  Recheck 2D echocardiogram April 2023

## 2021-10-31 NOTE — Assessment & Plan Note (Signed)
Lipid panel was not very good with LDL of 134.  She would really prefer to avoid regular medications.  She understands that there is risk involved with not treating lipids.  Due to her advanced age and extreme valvular disease, I would minimize therapy and avoid statin.  I suspect that her lipids are not as good now because she is in the retirement community and not eating as well.

## 2021-10-31 NOTE — Assessment & Plan Note (Signed)
Previously reported possible functional bicuspid aortic valve was read as normal on this echo.  Not really sure how that could have changed.  Thankfully, no signs of aortic stenosis.

## 2021-10-31 NOTE — Assessment & Plan Note (Signed)
Blood pressure is high today, but she had not yet taken her nadolol.  Monitor closely because we may want to consider adding afterload reduction based on worsening MR.

## 2021-10-31 NOTE — Assessment & Plan Note (Signed)
Remains on warfarin, partly because of esophageal ring but also persistent A. fib.

## 2021-10-31 NOTE — Assessment & Plan Note (Signed)
It would appear that the valvuloplasty may not have held.  It does not look like there was any flail leaflets or anything.  We will reassess in 2023.  Thankfully she is not having symptoms.

## 2021-11-26 ENCOUNTER — Telehealth: Payer: Medicare Other | Admitting: Cardiology

## 2021-11-26 ENCOUNTER — Telehealth: Payer: Self-pay | Admitting: Neurology

## 2021-11-26 NOTE — Telephone Encounter (Signed)
What were her symptoms? How long did it last?   If symptoms recur recommend going to ER. Add to my work-in on Wednesday.

## 2021-11-26 NOTE — Telephone Encounter (Signed)
Patient called and said she had a mini-stroke last night.  She is feeling fine this morning she said.  Patient did not go to the ED but does want to schedule a follow up with Dr. Posey Pronto.  Patient last seen 09/06/19.  Sending back for review and to advise on urgency.

## 2021-11-26 NOTE — Telephone Encounter (Signed)
It lasted less than a min. Sitting in chair, her eyes were glazed. Rushing water sound in her ears/head. No drooping. All over tiredness and weakness/foggy brain.

## 2021-11-29 ENCOUNTER — Encounter: Payer: Self-pay | Admitting: Cardiology

## 2021-11-29 DIAGNOSIS — R001 Bradycardia, unspecified: Secondary | ICD-10-CM

## 2021-11-29 DIAGNOSIS — H353131 Nonexudative age-related macular degeneration, bilateral, early dry stage: Secondary | ICD-10-CM | POA: Diagnosis not present

## 2021-11-29 DIAGNOSIS — I442 Atrioventricular block, complete: Secondary | ICD-10-CM | POA: Insufficient documentation

## 2021-11-29 DIAGNOSIS — Z961 Presence of intraocular lens: Secondary | ICD-10-CM | POA: Diagnosis not present

## 2021-12-01 ENCOUNTER — Encounter: Payer: Self-pay | Admitting: Neurology

## 2021-12-01 ENCOUNTER — Ambulatory Visit (INDEPENDENT_AMBULATORY_CARE_PROVIDER_SITE_OTHER): Payer: Medicare Other | Admitting: Neurology

## 2021-12-01 ENCOUNTER — Other Ambulatory Visit: Payer: Self-pay

## 2021-12-01 VITALS — BP 148/91 | HR 68 | Ht 65.0 in | Wt 170.0 lb

## 2021-12-01 DIAGNOSIS — R404 Transient alteration of awareness: Secondary | ICD-10-CM

## 2021-12-01 NOTE — Telephone Encounter (Signed)
I cannot think of any cardiac rhythm issue that would make you have slurred speech and decreased responsiveness unless you are severely bradycardic.  Not sure that her work-up would show anything.  These are transient spells that unless we capture them, I have no way of knowing what it is.  Prior to having her come in for a visit, I would say the only thing we can consider is having her wear a monitor for 2 weeks to see if he has any bradycardia.  There is no reason for me to have her see me before we have a monitor, because that is what I would order.  Glenetta Hew, MD

## 2021-12-01 NOTE — Patient Instructions (Signed)
Please follow up with cardiology

## 2021-12-01 NOTE — Telephone Encounter (Signed)
Routed to [facility] MD  From neuro note today: 1.  Transient altered awareness manifesting with brief slurred speech, nonresponsiveness, lasting 30-45 seconds. No focal neurological deficits on exam. Her heart rate seems irregular on auscultation and radial pulse, I am not sure if this is PVCs or underlying arrthymia. I would like her evaluated by her cardiologist to see if this event maybe cardiac related. Her history is not classic for TIA or seizure and therefore, I will hold on doing TIA work-up.

## 2021-12-01 NOTE — Progress Notes (Signed)
Follow-up Visit   Date: 12/01/21   Adriana Hendricks MRN: [id number] DOB: 1935-12-22   Interim History: Adriana Hendricks is a 86 y.o. right-handed Caucasian female with history of GERD, hypertension, s/p MAZE procedure and minimally invasive tricuspid valve repair (2020) and left CTS s/p release (2014) here for evaluation of altered awareness. She was previously seen in 2020 for neuropathy and hand tremor. The patient was accompanied by self.  History of present illness: She has previously been evaluated for left carpal tunnel syndrome and underwent release in 2014 which improved left hand paresthesias.  She has a long history of bilateral feet numbness, with work-up showing idiopathic neuropathy.  Previously, she has also complained of infrequent ocular migraines, described as jagged appearance with loss of central vision in her eyes, lasting 45 minutes.  She did not tolerate topiramate and found that symptoms improved with using polarized classes.  UPDATE 09/06/2019:  She is here with new complaints of tremors.  She underwent MAZE procedure and tricuspid valve repair in June.  Following the surgery, she began having worsening tremors which would interefere with her daily activities involving fine motor movements, such as feeding, putting jewelry/make-up on, and buttoning.  Over the past few weeks, the tremors are less intense, but still not back to her usual baseline.  Prior to her surgery she was on nadolol 40 mg, which was stopped and transition to metoprolol 25 mg twice daily.  She has noticed worsening numbness in the feet.  Her balance continues to be a problem, however she is very careful and has not suffered any falls.  She does walk with a cane.  UPDATE 12/01/2021: She is here with her husband for evaluation of altered sensorum. She last week, she was sitting in an office chair while watching TV and chatting with her husband when she suddenly started having slurred speech, then "zoned  out".  She had eyes open and appears glazed, did not respond to her husband, nor collapse or slump.  It lasted about 30-45 seconds.  She does not recall the event, except that she recalls having a wave sensation following the event. She did not loose consciousness, no weakness/numbness/tingling, chest pain, palpitations.  She has history of irregular heart rate and bradycardia. Following her event, her heart rate was in the 60s, but it's unclear what it was during the event.   No history of similar event in the past. No history of TIA or stroke.   Medications:  Current Outpatient Medications on File Prior to Visit  Medication Sig Dispense Refill   calcium carbonate (TUMS - DOSED IN MG ELEMENTAL CALCIUM) 500 MG chewable tablet Chew 1 tablet by mouth daily.      cholecalciferol (VITAMIN D3) 25 MCG (1000 UT) tablet Take 1,000 Units by mouth daily.     famotidine (PEPCID) 20 MG tablet Take 20 mg by mouth at bedtime.     furosemide (LASIX) 20 MG tablet Take 1 tablet (20 mg total) by mouth daily. May take an additional 20 mg if needed for swelling or shortness of breath 110 tablet 3   metoprolol tartrate (LOPRESSOR) 25 MG tablet Take 1 tablet (25 mg total) by mouth as needed. 20 tablet 6   Multiple Vitamins-Minerals (PRESERVISION AREDS 2 PO) Take 1 capsule by mouth in the morning and at bedtime.     nadolol (CORGARD) 20 MG tablet Take 1 tablet (20 mg total) by mouth daily. 90 tablet 3   potassium chloride 20 MEQ/15ML (10%) SOLN Take  15 ml by mouth once daily 450 mL 6   TURMERIC PO Take 500 mg by mouth 3 (three) times a week.     vitamin B-12 (CYANOCOBALAMIN) 250 MCG tablet Take 250 mcg by mouth daily. PT ISN'T SURE OF THE DOSAGE     warfarin (COUMADIN) 2.5 MG tablet TAKE 1 TO 3 TABLETS DAILY AS DIRECTED BY THE COUMADIN CLINIC (Patient taking differently: TAKE 1 TO 3 TABLETS DAILY AS DIRECTED BY THE COUMADIN CLINIC  Per patient she takes 2 tablets Monday, Wed, Friday and 1 tablet on the other days.) 60  tablet 0   No current facility-administered medications on file prior to visit.    Allergies:  Allergies  Allergen Reactions   Levofloxacin Other (See Comments)    Developed tendon pain. (Has a history of a Achilles tendon tear)   Potassium Chloride Hives   Clarithromycin Nausea Only   Erythromycin Base Nausea And Vomiting   Metronidazole Rash   Penicillins Rash and Other (See Comments)    Did it involve swelling of the face/tongue/throat, SOB, or low BP? No Did it involve sudden or severe rash/hives, skin peeling, or any reaction on the inside of your mouth or nose? No Did you need to seek medical attention at a hospital or doctor's office? No When did it last happen?  as a child If all above answers are "NO", may proceed with cephalosporin use.       Vital Signs:  BP (!) 148/91   Pulse 68   Ht '5\' 5"'  (1.651 m)   Wt 170 lb (77.1 kg)   LMP  (LMP Unknown)   SpO2 98%   BMI 28.29 kg/m   General Medical Exam:   General:  Well appearing, comfortable  Eyes/ENT: see cranial nerve examination.   Neck:   No carotid bruits. Respiratory:  Clear to auscultation, good air entry bilaterally.   Cardiac:  Irregular rate and rhythm, no murmur.    Neurological Exam: MENTAL STATUS including orientation to time, place, person, recent and remote memory, attention span and concentration, language, and fund of knowledge is normal.  Speech is not dysarthric.  CRANIAL NERVES:  No visual field defects.  Pupils equal round and reactive to light.  Normal conjugate, extra-ocular eye movements in all directions of gaze.  No ptosis.  Face is symmetric. Palate elevates symmetrically.  Tongue is midline.  MOTOR:  Motor strength is 5/5 in all extremities.  No atrophy, fasciculations or abnormal movements.  No pronator drift.  Tone is normal.    MSRs:  Reflexes are 2+/4 throughout, except trace at the ankles bilaterally.  SENSATION:  Vibration reduced below the ankles.   COORDINATION/GAIT:   Gait  mildly wide-based and stable, unassisted, but typically uses a cane.  Data:  Labs 09/02/2013:  Ceruloplasmin 43, copper 171, fT4 0.98, fT3 2.6, SPEP/UPEP with IFE, 2hr GTT 100*/121/123 Labs 07/05/2013:  Vitamin B12 1225, HbA1c 6.0, TSH 1.69 Labs 06/12/2015:  ESR 11, TSH 2.24  EMG left arm 09/04/2013:  Left median neuropathy at or distal to the wrist (consistent with carpal tunnel syndrome), mild in degree electrically, based the prolongation of the median sensory distal latencies.   IMPRESSION/PLAN:  1.  Transient altered awareness manifesting with brief slurred speech, nonresponsiveness, lasting 30-45 seconds. No focal neurological deficits on exam. Her heart rate seems irregular on auscultation and radial pulse, I am not sure if this is PVCs or underlying arrthymia. I would like her evaluated by her cardiologist to see if this event maybe cardiac  related. Her history is not classic for TIA or seizure and therefore, I will hold on doing TIA work-up.  2. Benign essential tremor, worse but not interfering with ADLs. Patient prefers to stay off medication  3.  Idiopathic peripheral neuropathy with sensory ataxia, stable.  Exam remains stable with distal sensory loss and moderate-severe sensory ataxia. She has not suffered any falls and is compliant with using a cane.    Thank you for allowing me to participate in patient's care.  If I can answer any additional questions, I would be pleased to do so.    Sincerely,    Mikahla Wisor K. Posey Pronto, DO

## 2021-12-02 ENCOUNTER — Ambulatory Visit (INDEPENDENT_AMBULATORY_CARE_PROVIDER_SITE_OTHER): Payer: Medicare Other

## 2021-12-02 DIAGNOSIS — R001 Bradycardia, unspecified: Secondary | ICD-10-CM

## 2021-12-02 NOTE — Progress Notes (Unsigned)
Enrolled for [employer] to mail a ZIO XT long term holter monitor to the patients address on file.  

## 2021-12-04 DIAGNOSIS — R001 Bradycardia, unspecified: Secondary | ICD-10-CM

## 2021-12-09 ENCOUNTER — Ambulatory Visit (INDEPENDENT_AMBULATORY_CARE_PROVIDER_SITE_OTHER): Payer: Medicare Other

## 2021-12-09 ENCOUNTER — Other Ambulatory Visit: Payer: Self-pay

## 2021-12-09 DIAGNOSIS — Z7901 Long term (current) use of anticoagulants: Secondary | ICD-10-CM

## 2021-12-09 DIAGNOSIS — I4819 Other persistent atrial fibrillation: Secondary | ICD-10-CM

## 2021-12-09 LAB — POCT INR: INR: 3 (ref 2.0–3.0)

## 2021-12-09 NOTE — Telephone Encounter (Signed)
Patient has been seen in office on 12/01/21.

## 2021-12-09 NOTE — Telephone Encounter (Signed)
Has this been completed?  Sending to clinical staff for review: Okay to sign/close encounter or is further follow up needed? 

## 2021-12-09 NOTE — Patient Instructions (Signed)
Continue with 1 tablet daily except 2 tablets each Monday, Wednesday and Friday.  Repeat INR in 6 weeks. [facility] [phone]

## 2021-12-23 ENCOUNTER — Telehealth: Payer: Self-pay | Admitting: Cardiology

## 2021-12-23 DIAGNOSIS — R001 Bradycardia, unspecified: Secondary | ICD-10-CM | POA: Diagnosis not present

## 2021-12-23 NOTE — Telephone Encounter (Signed)
Spoke with [place] she states that pt has completed her Zio monitor for 13 days with the following: First Degree AV Block was present. 1 Pause occurred lasting 6.6 secs (9 bpm). Pause occurred due to High Grade AV Block. 2 episode(s) of AV Block (High Grade) occurred, lasting a total of 6 secs. Please review

## 2021-12-23 NOTE — Telephone Encounter (Signed)
[place] from Home calling with abnormal zio patch readings.

## 2021-12-27 ENCOUNTER — Other Ambulatory Visit: Payer: Self-pay | Admitting: Cardiology

## 2021-12-28 NOTE — Progress Notes (Signed)
Electrophysiology Office Note:    Date:  12/29/2021   ID:  Adriana Hendricks, DOB 03-Oct-1935, MRN [id number]  PCP:  Marin Olp, MD  [facility] Cardiologist:  Glenetta Hew, MD  [facility] Electrophysiologist:  Vickie Epley, MD   Referring MD: Marin Olp, MD   Chief Complaint: AV block  History of Present Illness:    Adriana Hendricks is a 87 y.o. female who presents for an evaluation of AV block at the request of Dr harding. Their medical history includes TR/MR. She had a MAZE/LAAC/Tvrepair in 2020. Moderate-severe TR on 09/08/2021 echo. She had a heart monitor placed recently that has just resulted and shows CHB and pauses. On 11/29/2021 she reported a loss of consciousness. She has also had slurred speech episodes that lasted 30-45 seconds.   The patient is with her husband today in clinic.  He described an episode of the syncope to me in clinic.  He says that they were in the middle of a conversation and she suddenly stopped responding and had a blank stare.  The symptoms lasted for about 6 seconds before resolving.  She immediately returned to her normal self.  She is now had multiple of these episodes that are all very similar.     Past Medical History:  Diagnosis Date   Achilles tendon rupture    Arthritis of hand, right    Atrial flutter with rapid ventricular response (HCC)    Carpal tunnel syndrome 09/04/2013   Collagenous colitis 2005   Colon polyps 2010   Tubular adenoma and hyperplastic   Dental infection 10/25/2014   Diverticulosis    Esophageal stenosis    Essential tremor    GERD (gastroesophageal reflux disease)    hx hiatal hernia, hx esophagitis, hx stricture   Hiatal hernia    Hypertension    Lower extremity neuropathy 08/23/2013   On B12 therapy with numbness in the feet bilaterally no evidence of diabetes    Ocular migraine    jagged vision, a few per month   OSA (obstructive sleep apnea) 12/21/2016   on CPAP   Peripheral neuropathy     treated by Dr Posey Pronto (07/2015)   Persistent atrial fibrillation ([place])    Rate control with beta-blocker and anticoagulated with warfarin   Pleural effusion, left    S/P Maze operation for atrial fibrillation 06/12/2019   Complete bilateral atrial lesion set using cryothermy and bipolar radiofrequency ablation with clipping of LA appendage via right mini thoracotomy approach   S/P minimally invasive tricuspid valve repair 06/12/2019   Complex valvuloplasty including autologous pericardial patch augmentation of anterior and posterior leaflets with 28 mm Edwards mc3 ring annuloplasty via right mini thoracotomy approach   Severe tricuspid regurgitation 07/2018   Noted Aug 2019 during a fib workup. Severe LAE as well   Sinus node dysfunction (HCC)     Past Surgical History:  Procedure Laterality Date   48 hr Holter Monitor  07/2018   Persistent Afib (rate 33 - 124 bpm)   APPENDECTOMY     CARDIAC CATHETERIZATION     CARDIOVERSION N/A 10/02/2018   Procedure: CARDIOVERSION;  Surgeon: Nahser, Wonda Cheng, MD;  Location: [facility];  Service: Cardiovascular;  Laterality: N/A;   CARPAL TUNNEL RELEASE     CATARACT EXTRACTION     EXCISION MORTON'S NEUROMA     Right foot   EYE SURGERY     Cataracts with implants-bilateral   INTRAOCULAR LENS IMPLANT, SECONDARY     IR THORACENTESIS  ASP PLEURAL SPACE W/IMG GUIDE  07/10/2019   IR THORACENTESIS ASP PLEURAL SPACE W/IMG GUIDE  07/10/2019   IR THORACENTESIS ASP PLEURAL SPACE W/IMG GUIDE  07/26/2019   MINIMALLY INVASIVE MAZE PROCEDURE N/A 06/12/2019   Procedure: MINIMALLY INVASIVE MAZE PROCEDURE;  Surgeon: Rexene Alberts, MD;  Location: [place];  Service: Open Heart Surgery;  Laterality: N/A;   MINIMALLY INVASIVE TRICUSPID VALVE REPAIR Right 06/12/2019   Procedure: MINIMALLY INVASIVE TRICUSPID VALVE REPAIR USING EDWARDS MC3 T28 ANNULOPLASTY RING, INSERTION TEMPORARY TRANSVENOUS AV PACING LEAD;  Surgeon: Rexene Alberts, MD;  Location: [place];  Service: Open  Heart Surgery;  Laterality: Right;   Moles removed     MOUTH SURGERY     (For Exostosis)   NUCLEAR  07/2018   EF 71 %. LOW RISK - no ischemia or Infarct.    RIGHT/LEFT HEART CATH AND CORONARY ANGIOGRAPHY N/A 01/16/2019   Procedure: RIGHT/LEFT HEART CATH AND CORONARY ANGIOGRAPHY;  Surgeon: Leonie Man, MD;  Location: [place] CV LAB;; Angiographically normal coronary arteries.  Normal LVEDP. Upper Limit of Normal PAP~23 mmHg & PCWP 18 mmHg.  LVEDP of 7 mmHg. -With mean PAP of 23 mmHg there is a TPG suggesting a primary pulmonary etiology.   ROTATOR CUFF REPAIR     TEE WITHOUT CARDIOVERSION N/A 01/16/2019   Procedure: TRANSESOPHAGEAL ECHOCARDIOGRAM (TEE);  Surgeon: Sueanne Margarita, MD;  Location: [facility] ENDOSCOPY;;  Severe TR due to poor coaptation of the leaflets from annular dilation.  Mild to moderate mitral vegetation with mildly restricted mobility of the posterior leaflet.  EF 55-60% with no R WMA.  Severe RA and LA dilation.   TEE WITHOUT CARDIOVERSION N/A 06/12/2019   Procedure: TRANSESOPHAGEAL ECHOCARDIOGRAM (TEE);  Surgeon: Rexene Alberts, MD;  Location: [place];  Service: Open Heart Surgery;  Laterality: N/A;   TONSILLECTOMY     TRANSTHORACIC ECHOCARDIOGRAM  08/05/2019   First postop TAVR: EF 60 to 65%. Mod concentric LVH. Grade III DD (? w/ Mild LA dilation).  Rheumatic MV w/ mod thickening & Ca2+.  Anterior leaflet has mild doming but no MVP.  Mild-mod MR and mild to mod MS (mean MVA gradient 7 mmHg).  ~ functional bicuspid AoV w/ Mod Sclerosis - no AS.  Thoracic Aorta (4.1 & 3.7 @ Sinus of  V, prox Ascending) - mild dilation   TRANSTHORACIC ECHOCARDIOGRAM  09/08/2021   EF 60 to 65%.  No R WMA.  Unable to assess diastolic function.  Only mild LA dilation.  Severely elevated PAP-64.5 mmHg.  Mild to moderate RA dilation.  Moderate MR.  Repaired TV has moderate-severe TR    Current Medications: Current Meds  Medication Sig   calcium carbonate (TUMS - DOSED IN MG ELEMENTAL CALCIUM)  500 MG chewable tablet Chew 1 tablet by mouth daily.    cholecalciferol (VITAMIN D3) 25 MCG (1000 UT) tablet Take 1,000 Units by mouth daily.   famotidine (PEPCID) 20 MG tablet Take 20 mg by mouth at bedtime.   furosemide (LASIX) 20 MG tablet Take 1 tablet (20 mg total) by mouth daily. May take an additional 20 mg if needed for swelling or shortness of breath   metoprolol tartrate (LOPRESSOR) 25 MG tablet Take 1 tablet (25 mg total) by mouth as needed.   Multiple Vitamins-Minerals (PRESERVISION AREDS 2 PO) Take 1 capsule by mouth in the morning and at bedtime.   nadolol (CORGARD) 20 MG tablet Take 1 tablet (20 mg total) by mouth daily.   potassium chloride 20 MEQ/15ML (  10%) SOLN TAKE 15 ML BY MOUTH ONCE DAILY *DILUTE WITH 4-8 OUNCES OF LIQUID* *TAKE WITH FOOD*   TURMERIC PO Take 500 mg by mouth 3 (three) times a week.   vitamin B-12 (CYANOCOBALAMIN) 250 MCG tablet Take 250 mcg by mouth daily. PT ISN'T SURE OF THE DOSAGE   warfarin (COUMADIN) 2.5 MG tablet TAKE 1 TO 2 TABLETS BY MOUTH ONCE DAILY OR AS INSTRUCTED BY COUMADIN CLINIC     Allergies:   Levofloxacin, Potassium chloride, Clarithromycin, Erythromycin base, Metronidazole, and Penicillins   Social History   Socioeconomic History   Marital status: Married    Spouse name: Not on file   Number of children: 2   Years of education: 16   Highest education level: Not on file  Occupational History   Occupation: Retired  Tobacco Use   Smoking status: Former    Packs/day: 2.00    Years: 13.00    Pack years: 26.00    Types: Cigarettes    Start date: 12/26/1954    Quit date: 03/26/1968    Years since quitting: 53.7   Smokeless tobacco: Never   Tobacco comments:    Quit in 1969 - smokeed 2-3PPD , was 3 PPD at her heaviest for about 3 years.   Vaping Use   Vaping Use: Never used  Substance and Sexual Activity   Alcohol use: Yes    Alcohol/week: 2.0 standard drinks    Types: 2 Glasses of wine per week    Comment: 2 glass of wine per day    Drug use: No   Sexual activity: Yes  Other Topics Concern   Not on file  Social History Narrative   Married. Husband is patient of Dr. Thurnell Garbe   Married 1958. 2 children. 4 grandkids (3 girls, 1 boy).    Retired from [employer].   Hobbies: genealogy and DNA   Right handed    One story      Recently moved to a retirement community in [place] (spring 2022).  Taking advantage of the fact that she does not have to cook every day.  Is also taking advantage of the multiple daily exercise classes.  She tries to do classes at least 3 days a week if not more.  She is also doing much more walking around the community.      She and her husband are in the process of selling the house.  Hopefully close in July 2022   Social Determinants of Health   Financial Resource Strain: Not on file  Food Insecurity: Not on file  Transportation Needs: Not on file  Physical Activity: Not on file  Stress: Not on file  Social Connections: Not on file     Family History: The patient's family history includes Barrett's esophagus in her son; Heart failure in her father; Other in her mother; Rectal cancer in her father. There is no history of Colon cancer, Esophageal cancer, or Stomach cancer.  ROS:   Please see the history of present illness.    All other systems reviewed and are negative.  EKGs/Labs/Other Studies Reviewed:    The following studies were reviewed today:  12/28/2020 Zio monitor personally reviewed 6.6 second CHB 27 SVT, longest episode 136 bpm   EKG:  The ekg ordered today demonstrates sinus rhythm.   Recent Labs: 09/07/2021: ALT 18; BUN 20; Creatinine, Ser 0.89; Hemoglobin 14.0; Platelets 174; Potassium 4.7; Sodium 140; TSH 3.280  Recent Lipid Panel    Component Value  Date/Time   CHOL 224 (H) 09/07/2021 0827   TRIG 189 (H) 09/07/2021 0827   HDL 57 09/07/2021 0827   CHOLHDL 3.9 09/07/2021 0827   CHOLHDL 3 04/02/2020 1527   VLDL 29.0  04/02/2020 1527   LDLCALC 134 (H) 09/07/2021 0827   LDLDIRECT 128.8 01/17/2011 0941    Physical Exam:    VS:  BP 118/64    Pulse 72    Ht 5\' 5"  (1.651 m)    Wt 171 lb 6.4 oz (77.7 kg)    LMP  (LMP Unknown)    SpO2 93%    BMI 28.52 kg/m     Wt Readings from Last 3 Encounters:  12/29/21 171 lb 6.4 oz (77.7 kg)  12/01/21 170 lb (77.1 kg)  10/26/21 171 lb 4 oz (77.7 kg)     GEN:  Well nourished, well developed in no acute distress HEENT: Normal NECK: No JVD; No carotid bruits LYMPHATICS: No lymphadenopathy CARDIAC: RRR, no murmurs, rubs, gallops RESPIRATORY:  Clear to auscultation without rales, wheezing or rhonchi  ABDOMEN: Soft, non-tender, non-distended MUSCULOSKELETAL:  No edema; No deformity  SKIN: Warm and dry NEUROLOGIC:  Alert and oriented x 3 PSYCHIATRIC:  Normal affect       ASSESSMENT:    1. S/P minimally invasive tricuspid valve repair   2. S/P Maze operation for atrial fibrillation   3. Heart block AV complete (HCC)    PLAN:    In order of problems listed above:  #Complete heart block #Syncope She has had multiple episodes of syncope that are arrhythmic in origin.  Heart monitor is shown paroxysms of complete heart block.  She has a history of tricuspid valve repair with ring placement.  She will require permanent pacemaker.  I discussed the procedure in detail with the patient and her husband during today's clinic appointment.  She wishes to proceed.  She will hold her Coumadin until after the procedure.  We will check an INR today.  I will plan to implant a Biotronik dual-chamber permanent pacemaker system with a left bundle area lead.  Risks, benefits, alternatives to PPM implantation were discussed in detail with the patient today. The patient understands that the risks include but are not limited to bleeding, infection, pneumothorax, perforation, tamponade, vascular damage, renal failure, MI, stroke, death, and lead dislodgement and wishes to proceed.   We will therefore schedule device implantation at the next available time.  #History of severe TR post tricuspid valve minimally invasive repair Echo shows significant TR in September of this past year.  She has a repeat echo scheduled.  Total time spent with patient today 65 minutes. This includes reviewing records, evaluating the patient and coordinating care.  Medication Adjustments/Labs and Tests Ordered: Current medicines are reviewed at length with the patient today.  Concerns regarding medicines are outlined above.  No orders of the defined types were placed in this encounter. The No orders of the defined types were placed in this encounter.    Signed, Hilton Cork. Quentin Ore, MD, [facility], [facility] 12/29/2021 2:25 PM    Electrophysiology [facility]

## 2021-12-28 NOTE — H&P (View-Only) (Signed)
Electrophysiology Office Note:    Date:  12/29/2021   ID:  Adriana Hendricks, DOB [birth year], MRN [id number]  PCP:  Marin Olp, MD  [facility] Cardiologist:  Glenetta Hew, MD  [facility] Electrophysiologist:  Vickie Epley, MD   Referring MD: Marin Olp, MD   Chief Complaint: AV block  History of Present Illness:    Adriana Hendricks is a 87 y.o. female who presents for an evaluation of AV block at the request of Dr harding. Their medical history includes TR/MR. She had a MAZE/LAAC/Tvrepair in 2020. Moderate-severe TR on 09/08/2021 echo. She had a heart monitor placed recently that has just resulted and shows CHB and pauses. On 11/29/2021 she reported a loss of consciousness. She has also had slurred speech episodes that lasted 30-45 seconds.   The patient is with her husband today in clinic.  He described an episode of the syncope to me in clinic.  He says that they were in the middle of a conversation and she suddenly stopped responding and had a blank stare.  The symptoms lasted for about 6 seconds before resolving.  She immediately returned to her normal self.  She is now had multiple of these episodes that are all very similar.     Past Medical History:  Diagnosis Date   Achilles tendon rupture    Arthritis of hand, right    Atrial flutter with rapid ventricular response (HCC)    Carpal tunnel syndrome 09/04/2013   Collagenous colitis 2005   Colon polyps 2010   Tubular adenoma and hyperplastic   Dental infection 10/25/2014   Diverticulosis    Esophageal stenosis    Essential tremor    GERD (gastroesophageal reflux disease)    hx hiatal hernia, hx esophagitis, hx stricture   Hiatal hernia    Hypertension    Lower extremity neuropathy 08/23/2013   On B12 therapy with numbness in the feet bilaterally no evidence of diabetes    Ocular migraine    jagged vision, a few per month   OSA (obstructive sleep apnea) 12/21/2016   on CPAP   Peripheral neuropathy     treated by Dr Posey Pronto (07/2015)   Persistent atrial fibrillation ([place])    Rate control with beta-blocker and anticoagulated with warfarin   Pleural effusion, left    S/P Maze operation for atrial fibrillation 06/12/2019   Complete bilateral atrial lesion set using cryothermy and bipolar radiofrequency ablation with clipping of LA appendage via right mini thoracotomy approach   S/P minimally invasive tricuspid valve repair 06/12/2019   Complex valvuloplasty including autologous pericardial patch augmentation of anterior and posterior leaflets with 28 mm Edwards mc3 ring annuloplasty via right mini thoracotomy approach   Severe tricuspid regurgitation 07/2018   Noted Aug 2019 during a fib workup. Severe LAE as well   Sinus node dysfunction (HCC)     Past Surgical History:  Procedure Laterality Date   48 hr Holter Monitor  07/2018   Persistent Afib (rate 33 - 124 bpm)   APPENDECTOMY     CARDIAC CATHETERIZATION     CARDIOVERSION N/A 10/02/2018   Procedure: CARDIOVERSION;  Surgeon: Nahser, Wonda Cheng, MD;  Location: [facility];  Service: Cardiovascular;  Laterality: N/A;   CARPAL TUNNEL RELEASE     CATARACT EXTRACTION     EXCISION MORTON'S NEUROMA     Right foot   EYE SURGERY     Cataracts with implants-bilateral   INTRAOCULAR LENS IMPLANT, SECONDARY     IR THORACENTESIS  ASP PLEURAL SPACE W/IMG GUIDE  07/10/2019   IR THORACENTESIS ASP PLEURAL SPACE W/IMG GUIDE  07/10/2019   IR THORACENTESIS ASP PLEURAL SPACE W/IMG GUIDE  07/26/2019   MINIMALLY INVASIVE MAZE PROCEDURE N/A 06/12/2019   Procedure: MINIMALLY INVASIVE MAZE PROCEDURE;  Surgeon: Rexene Alberts, MD;  Location: [place];  Service: Open Heart Surgery;  Laterality: N/A;   MINIMALLY INVASIVE TRICUSPID VALVE REPAIR Right 06/12/2019   Procedure: MINIMALLY INVASIVE TRICUSPID VALVE REPAIR USING EDWARDS MC3 T28 ANNULOPLASTY RING, INSERTION TEMPORARY TRANSVENOUS AV PACING LEAD;  Surgeon: Rexene Alberts, MD;  Location: [place];  Service: Open  Heart Surgery;  Laterality: Right;   Moles removed     MOUTH SURGERY     (For Exostosis)   NUCLEAR  07/2018   EF 71 %. LOW RISK - no ischemia or Infarct.    RIGHT/LEFT HEART CATH AND CORONARY ANGIOGRAPHY N/A 01/16/2019   Procedure: RIGHT/LEFT HEART CATH AND CORONARY ANGIOGRAPHY;  Surgeon: Leonie Man, MD;  Location: [place] CV LAB;; Angiographically normal coronary arteries.  Normal LVEDP. Upper Limit of Normal PAP~23 mmHg & PCWP 18 mmHg.  LVEDP of 7 mmHg. -With mean PAP of 23 mmHg there is a TPG suggesting a primary pulmonary etiology.   ROTATOR CUFF REPAIR     TEE WITHOUT CARDIOVERSION N/A 01/16/2019   Procedure: TRANSESOPHAGEAL ECHOCARDIOGRAM (TEE);  Surgeon: Sueanne Margarita, MD;  Location: [facility] ENDOSCOPY;;  Severe TR due to poor coaptation of the leaflets from annular dilation.  Mild to moderate mitral vegetation with mildly restricted mobility of the posterior leaflet.  EF 55-60% with no R WMA.  Severe RA and LA dilation.   TEE WITHOUT CARDIOVERSION N/A 06/12/2019   Procedure: TRANSESOPHAGEAL ECHOCARDIOGRAM (TEE);  Surgeon: Rexene Alberts, MD;  Location: [place];  Service: Open Heart Surgery;  Laterality: N/A;   TONSILLECTOMY     TRANSTHORACIC ECHOCARDIOGRAM  08/05/2019   First postop TAVR: EF 60 to 65%. Mod concentric LVH. Grade III DD (? w/ Mild LA dilation).  Rheumatic MV w/ mod thickening & Ca2+.  Anterior leaflet has mild doming but no MVP.  Mild-mod MR and mild to mod MS (mean MVA gradient 7 mmHg).  ~ functional bicuspid AoV w/ Mod Sclerosis - no AS.  Thoracic Aorta (4.1 & 3.7 @ Sinus of  V, prox Ascending) - mild dilation   TRANSTHORACIC ECHOCARDIOGRAM  09/08/2021   EF 60 to 65%.  No R WMA.  Unable to assess diastolic function.  Only mild LA dilation.  Severely elevated PAP-64.5 mmHg.  Mild to moderate RA dilation.  Moderate MR.  Repaired TV has moderate-severe TR    Current Medications: Current Meds  Medication Sig   calcium carbonate (TUMS - DOSED IN MG ELEMENTAL CALCIUM)  500 MG chewable tablet Chew 1 tablet by mouth daily.    cholecalciferol (VITAMIN D3) 25 MCG (1000 UT) tablet Take 1,000 Units by mouth daily.   famotidine (PEPCID) 20 MG tablet Take 20 mg by mouth at bedtime.   furosemide (LASIX) 20 MG tablet Take 1 tablet (20 mg total) by mouth daily. May take an additional 20 mg if needed for swelling or shortness of breath   metoprolol tartrate (LOPRESSOR) 25 MG tablet Take 1 tablet (25 mg total) by mouth as needed.   Multiple Vitamins-Minerals (PRESERVISION AREDS 2 PO) Take 1 capsule by mouth in the morning and at bedtime.   nadolol (CORGARD) 20 MG tablet Take 1 tablet (20 mg total) by mouth daily.   potassium chloride 20 MEQ/15ML (  10%) SOLN TAKE 15 ML BY MOUTH ONCE DAILY *DILUTE WITH 4-8 OUNCES OF LIQUID* *TAKE WITH FOOD*   TURMERIC PO Take 500 mg by mouth 3 (three) times a week.   vitamin B-12 (CYANOCOBALAMIN) 250 MCG tablet Take 250 mcg by mouth daily. PT ISN'T SURE OF THE DOSAGE   warfarin (COUMADIN) 2.5 MG tablet TAKE 1 TO 2 TABLETS BY MOUTH ONCE DAILY OR AS INSTRUCTED BY COUMADIN CLINIC     Allergies:   Levofloxacin, Potassium chloride, Clarithromycin, Erythromycin base, Metronidazole, and Penicillins   Social History   Socioeconomic History   Marital status: Married    Spouse name: Not on file   Number of children: 2   Years of education: 16   Highest education level: Not on file  Occupational History   Occupation: Retired  Tobacco Use   Smoking status: Former    Packs/day: 2.00    Years: 13.00    Pack years: 26.00    Types: Cigarettes    Start date: 12/26/1954    Quit date: 03/26/1968    Years since quitting: 53.7   Smokeless tobacco: Never   Tobacco comments:    Quit in 1969 - smokeed 2-3PPD , was 3 PPD at her heaviest for about 3 years.   Vaping Use   Vaping Use: Never used  Substance and Sexual Activity   Alcohol use: Yes    Alcohol/week: 2.0 standard drinks    Types: 2 Glasses of wine per week    Comment: 2 glass of wine per day    Drug use: No   Sexual activity: Yes  Other Topics Concern   Not on file  Social History Narrative   Married. Husband is patient of Dr. Thurnell Garbe   Married 1958. 2 children. 4 grandkids (3 girls, 1 boy).    Retired from [employer].   Hobbies: genealogy and DNA   Right handed    One story      Recently moved to a retirement community in [place] (spring 2022).  Taking advantage of the fact that she does not have to cook every day.  Is also taking advantage of the multiple daily exercise classes.  She tries to do classes at least 3 days a week if not more.  She is also doing much more walking around the community.      She and her husband are in the process of selling the house.  Hopefully close in July 2022   Social Determinants of Health   Financial Resource Strain: Not on file  Food Insecurity: Not on file  Transportation Needs: Not on file  Physical Activity: Not on file  Stress: Not on file  Social Connections: Not on file     Family History: The patient's family history includes Barrett's esophagus in her son; Heart failure in her father; Other in her mother; Rectal cancer in her father. There is no history of Colon cancer, Esophageal cancer, or Stomach cancer.  ROS:   Please see the history of present illness.    All other systems reviewed and are negative.  EKGs/Labs/Other Studies Reviewed:    The following studies were reviewed today:  12/28/2020 Zio monitor personally reviewed 6.6 second CHB 27 SVT, longest episode 136 bpm   EKG:  The ekg ordered today demonstrates sinus rhythm.   Recent Labs: 09/07/2021: ALT 18; BUN 20; Creatinine, Ser 0.89; Hemoglobin 14.0; Platelets 174; Potassium 4.7; Sodium 140; TSH 3.280  Recent Lipid Panel    Component Value  Date/Time   CHOL 224 (H) 09/07/2021 0827   TRIG 189 (H) 09/07/2021 0827   HDL 57 09/07/2021 0827   CHOLHDL 3.9 09/07/2021 0827   CHOLHDL 3 04/02/2020 1527   VLDL 29.0  04/02/2020 1527   LDLCALC 134 (H) 09/07/2021 0827   LDLDIRECT 128.8 01/17/2011 0941    Physical Exam:    VS:  BP 118/64    Pulse 72    Ht 5\' 5"  (1.651 m)    Wt 171 lb 6.4 oz (77.7 kg)    LMP  (LMP Unknown)    SpO2 93%    BMI 28.52 kg/m     Wt Readings from Last 3 Encounters:  12/29/21 171 lb 6.4 oz (77.7 kg)  12/01/21 170 lb (77.1 kg)  10/26/21 171 lb 4 oz (77.7 kg)     GEN:  Well nourished, well developed in no acute distress HEENT: Normal NECK: No JVD; No carotid bruits LYMPHATICS: No lymphadenopathy CARDIAC: RRR, no murmurs, rubs, gallops RESPIRATORY:  Clear to auscultation without rales, wheezing or rhonchi  ABDOMEN: Soft, non-tender, non-distended MUSCULOSKELETAL:  No edema; No deformity  SKIN: Warm and dry NEUROLOGIC:  Alert and oriented x 3 PSYCHIATRIC:  Normal affect       ASSESSMENT:    1. S/P minimally invasive tricuspid valve repair   2. S/P Maze operation for atrial fibrillation   3. Heart block AV complete (HCC)    PLAN:    In order of problems listed above:  #Complete heart block #Syncope She has had multiple episodes of syncope that are arrhythmic in origin.  Heart monitor is shown paroxysms of complete heart block.  She has a history of tricuspid valve repair with ring placement.  She will require permanent pacemaker.  I discussed the procedure in detail with the patient and her husband during today's clinic appointment.  She wishes to proceed.  She will hold her Coumadin until after the procedure.  We will check an INR today.  I will plan to implant a Biotronik dual-chamber permanent pacemaker system with a left bundle area lead.  Risks, benefits, alternatives to PPM implantation were discussed in detail with the patient today. The patient understands that the risks include but are not limited to bleeding, infection, pneumothorax, perforation, tamponade, vascular damage, renal failure, MI, stroke, death, and lead dislodgement and wishes to proceed.   We will therefore schedule device implantation at the next available time.  #History of severe TR post tricuspid valve minimally invasive repair Echo shows significant TR in September of this past year.  She has a repeat echo scheduled.  Total time spent with patient today 65 minutes. This includes reviewing records, evaluating the patient and coordinating care.  Medication Adjustments/Labs and Tests Ordered: Current medicines are reviewed at length with the patient today.  Concerns regarding medicines are outlined above.  No orders of the defined types were placed in this encounter. The No orders of the defined types were placed in this encounter.    Signed, Hilton Cork. Quentin Ore, MD, [facility], [facility] 12/29/2021 2:25 PM    Electrophysiology [facility]

## 2021-12-28 NOTE — Telephone Encounter (Signed)
Prescription refill request received for warfarin Lov:  10/05/21 [name])  Next INR check: 01/20/22 Warfarin tablet strength: 2.5mg   Appropriate dose and refill sent to requested pharmacy.

## 2021-12-28 NOTE — Telephone Encounter (Signed)
Left detailed message for Pt per DPR.  (Attempted to contact Pt at all numbers listed, husband and daughter)  Left detailed message for Pt requesting call back based on HM results placed for syncope.  (Note-Pt is on a beta blocker)  Please schedule to see Dr. Quentin Ore 12/29/2021 at 2:00 pm

## 2021-12-28 NOTE — Telephone Encounter (Signed)
Left message on patient home and cell phone to call back to schedule appointment with our office. Tomorrow for an EP Consult. There is HOLD on Adriana Hendricks at 2pm on 1/4.

## 2021-12-29 ENCOUNTER — Other Ambulatory Visit: Payer: Self-pay

## 2021-12-29 ENCOUNTER — Encounter: Payer: Self-pay | Admitting: *Deleted

## 2021-12-29 ENCOUNTER — Encounter: Payer: Self-pay | Admitting: Cardiology

## 2021-12-29 ENCOUNTER — Ambulatory Visit (INDEPENDENT_AMBULATORY_CARE_PROVIDER_SITE_OTHER): Payer: Medicare Other | Admitting: Cardiology

## 2021-12-29 VITALS — BP 118/64 | HR 72 | Ht 65.0 in | Wt 171.4 lb

## 2021-12-29 DIAGNOSIS — Z9889 Other specified postprocedural states: Secondary | ICD-10-CM | POA: Diagnosis not present

## 2021-12-29 DIAGNOSIS — I442 Atrioventricular block, complete: Secondary | ICD-10-CM | POA: Diagnosis not present

## 2021-12-29 DIAGNOSIS — Z8679 Personal history of other diseases of the circulatory system: Secondary | ICD-10-CM | POA: Diagnosis not present

## 2021-12-29 LAB — CBC WITH DIFFERENTIAL/PLATELET
Basophils Absolute: 0 10*3/uL (ref 0.0–0.2)
Basos: 0 %
EOS (ABSOLUTE): 0.1 10*3/uL (ref 0.0–0.4)
Eos: 1 %
Hematocrit: 40.8 % (ref 34.0–46.6)
Hemoglobin: 13.9 g/dL (ref 11.1–15.9)
Lymphocytes Absolute: 1.3 10*3/uL (ref 0.7–3.1)
Lymphs: 14 %
MCH: 32.5 pg (ref 26.6–33.0)
MCHC: 34.1 g/dL (ref 31.5–35.7)
MCV: 95 fL (ref 79–97)
Monocytes Absolute: 0.8 10*3/uL (ref 0.1–0.9)
Monocytes: 9 %
Neutrophils Absolute: 6.9 10*3/uL (ref 1.4–7.0)
Neutrophils: 76 %
Platelets: 191 10*3/uL (ref 150–450)
RBC: 4.28 x10E6/uL (ref 3.77–5.28)
RDW: 13.1 % (ref 11.7–15.4)
WBC: 9 10*3/uL (ref 3.4–10.8)

## 2021-12-29 LAB — BASIC METABOLIC PANEL
BUN/Creatinine Ratio: 29 — ABNORMAL HIGH (ref 12–28)
BUN: 27 mg/dL (ref 8–27)
CO2: 31 mmol/L — ABNORMAL HIGH (ref 20–29)
Calcium: 10.1 mg/dL (ref 8.7–10.3)
Chloride: 98 mmol/L (ref 96–106)
Creatinine, Ser: 0.92 mg/dL (ref 0.57–1.00)
Glucose: 109 mg/dL — ABNORMAL HIGH (ref 70–99)
Potassium: 3.9 mmol/L (ref 3.5–5.2)
Sodium: 141 mmol/L (ref 134–144)
eGFR: 61 mL/min/{1.73_m2} (ref >59)

## 2021-12-29 LAB — PROTIME-INR
INR: 3.4 — ABNORMAL HIGH (ref 0.9–1.2)
Prothrombin Time: 32.7 s — ABNORMAL HIGH (ref 9.1–12.0)

## 2021-12-29 NOTE — Patient Instructions (Addendum)
Medication Instructions:  Your physician recommends that you continue on your current medications as directed. Please refer to the Current Medication list given to you today. *If you need a refill on your cardiac medications before your next appointment, please call your pharmacy*  Lab Work: CBC, BMP, INR If you have labs (blood work) drawn today and your tests are completely normal, you will receive your results only by: [place] (if you have MyChart) OR A paper copy in the mail If you have any lab test that is abnormal or we need to change your treatment, we will call you to review the results.  Testing/Procedures: Your physician has recommended that you have a pacemaker inserted. A pacemaker is a small device that is placed under the skin of your chest or abdomen to help control abnormal heart rhythms. This device uses electrical pulses to prompt the heart to beat at a normal rate. Pacemakers are used to treat heart rhythms that are too slow. Wire (leads) are attached to the pacemaker that goes into the chambers of you heart. This is done in the hospital and usually requires and overnight stay. Please see the instruction sheet given to you today for more information.   Follow-Up: At [facility], you and your health needs are our priority.  As part of our continuing mission to provide you with exceptional heart care, we have created designated Provider Care Teams.  These Care Teams include your primary Cardiologist (physician) and Advanced Practice Providers (APPs -  Physician Assistants and Nurse Practitioners) who all work together to provide you with the care you need, when you need it.   We recommend signing up for the patient portal called "MyChart".  Sign up information is provided on this After Visit Summary.  MyChart is used to connect with patients for Virtual Visits (Telemedicine).  Patients are able to view lab/test results, encounter notes, upcoming appointments, etc.   Non-urgent messages can be sent to your provider as well.   To learn more about what you can do with MyChart, go to [email or web address].    Any Other Special Instructions Will Be Listed Below (If Applicable).  Pacemaker Implantation, Adult Pacemaker implantation is a procedure to place a pacemaker inside the chest. A pacemaker is a small computer that sends electrical signals to the heart and helps the heart beat normally. A pacemaker also stores information about heart rhythms. You may need pacemaker implantation if you have: A slow heartbeat (bradycardia). Loss of consciousness that happens repeatedly (syncope) or repeated episodes of dizziness or light-headedness because of an irregular heart rate. Shortness of breath (dyspnea) due to heart problems. The pacemaker usually attaches to your heart through a wire called a lead. One or two leads may be needed. There are different types of pacemakers: Transvenous pacemaker. This type is placed under the skin or muscle of your upper chest area. The lead goes through a vein in the chest area to reach the inside of the heart. Epicardial pacemaker. This type is placed under the skin or muscle of your chest or abdomen. The lead goes through your chest to the outside of the heart. Tell a health care provider about: Any allergies you have. All medicines you are taking, including vitamins, herbs, eye drops, creams, and over-the-counter medicines. Any problems you or family members have had with anesthetic medicines. Any blood or bone disorders you have. Any surgeries you have had. Any medical conditions you have. Whether you are pregnant or may be pregnant. What are the  risks? Generally, this is a safe procedure. However, problems may occur, including: Infection. Bleeding. Failure of the pacemaker or the lead. Collapse of a lung or bleeding into a lung. Blood clot inside a blood vessel with a lead. Damage to the heart. Infection inside the  heart (endocarditis). Allergic reactions to medicines. What happens before the procedure? Staying hydrated Follow instructions from your health care provider about hydration, which may include: Up to 2 hours before the procedure - you may continue to drink clear liquids, such as water, clear fruit juice, black coffee, and plain tea.  Eating and drinking restrictions Follow instructions from your health care provider about eating and drinking, which may include: 8 hours before the procedure - stop eating heavy meals or foods, such as meat, fried foods, or fatty foods. 6 hours before the procedure - stop eating light meals or foods, such as toast or cereal. 6 hours before the procedure - stop drinking milk or drinks that contain milk. 2 hours before the procedure - stop drinking clear liquids. Medicines Ask your health care provider about: Changing or stopping your regular medicines. This is especially important if you are taking diabetes medicines or blood thinners. Taking medicines such as aspirin and ibuprofen. These medicines can thin your blood. Do not take these medicines unless your health care provider tells you to take them. Taking over-the-counter medicines, vitamins, herbs, and supplements. Tests You may have: A heart evaluation. This may include: An electrocardiogram (ECG). This involves placing patches on your skin to check your heart rhythm. A chest X-ray. An echocardiogram. This is a test that uses sound waves (ultrasound) to produce an image of the heart. A cardiac rhythm monitor. This is used to record your heart rhythm and any events for a longer period of time. Blood tests. Genetic testing. General instructions Do not use any products that contain nicotine or tobacco for at least 4 weeks before the procedure. These products include cigarettes, e-cigarettes, and chewing tobacco. If you need help quitting, ask your health care provider. Ask your health care provider: How  your surgery site will be marked. What steps will be taken to help prevent infection. These steps may include: Removing hair at the surgery site. Washing skin with a germ-killing soap. Receiving antibiotic medicine. Plan to have someone take you home from the hospital or clinic. If you will be going home right after the procedure, plan to have someone with you for 24 hours. What happens during the procedure? An IV will be inserted into one of your veins. You will be given one or more of the following: A medicine to help you relax (sedative). A medicine to numb the area (local anesthetic). A medicine to make you fall asleep (general anesthetic). The next steps vary depending on the type of pacemaker you will be getting. If you are getting a transvenous pacemaker: An incision will be made in your upper chest. A pocket will be made for the pacemaker. It may be placed under the skin or between layers of muscle. The lead will be inserted into a blood vessel that goes to the heart. While X-rays are taken by an imaging machine (fluoroscopy), the lead will be advanced through the vein to the inside of your heart. The other end of the lead will be tunneled under the skin and attached to the pacemaker. If you are getting an epicardial pacemaker: An incision will be made near your ribs or breastbone (sternum) for the lead. The lead will be attached to  the outside of your heart. Another incision will be made in your chest or upper abdomen to create a pocket for the pacemaker. The free end of the lead will be tunneled under the skin and attached to the pacemaker. The transvenous or epicardial pacemaker will be tested. Imaging studies may be done to check the lead position. The incisions will be closed with stitches (sutures), adhesive strips, or skin glue. Bandages (dressings) will be placed over the incisions. The procedure may vary among health care providers and hospitals. What happens after the  procedure? Your blood pressure, heart rate, breathing rate, and blood oxygen level will be monitored until you leave the hospital or clinic. You may be given antibiotics. You will be given pain medicine. An ECG and chest X-rays will be done. You may need to wear a continuous type of ECG (Holter monitor) to check your heart rhythm. Your health care provider will program the pacemaker. If you were given a sedative during the procedure, it can affect you for several hours. Do not drive or operate machinery until your health care provider says that it is safe. You will be given a pacemaker identification card. This card lists the implant date, device model, and manufacturer of your pacemaker. Summary A pacemaker is a small computer that sends electrical signals to the heart and helps the heart beat normally. There are different types of pacemakers. A pacemaker may be placed under the skin or muscle of your chest or abdomen. Follow instructions from your health care provider about eating and drinking and about taking medicines before the procedure. This information is not intended to replace advice given to you by your health care provider. Make sure you discuss any questions you have with your health care provider. Document Revised: 08/24/2021 Document Reviewed: 11/13/2019 Elsevier Patient Education  2022 [employer].

## 2021-12-30 ENCOUNTER — Telehealth: Payer: Self-pay | Admitting: *Deleted

## 2021-12-30 ENCOUNTER — Encounter: Payer: Self-pay | Admitting: Cardiology

## 2021-12-30 MED ORDER — PHYTONADIONE 5 MG PO TABS: 2.5000 mg | ORAL_TABLET | Freq: Once | ORAL | 0 refills | Status: AC

## 2021-12-30 NOTE — Telephone Encounter (Signed)
The patient has been notified of the result and verbalized understanding.  All questions (if any) were answered. Darrell Jewel, RN 12/30/2021 8:31 AM    Sent in prescription. Patient will pick up asap.

## 2021-12-30 NOTE — Telephone Encounter (Signed)
-----   Message from Vickie Epley, MD sent at 12/29/2021  8:36 PM EST ----- INR elevated.  Please send in a prescription for Vitamin K 2mg  by mouth once. This dose should be taken 12/30/2021 which is the day before the planned pacemaker implant procedure.  Lysbeth Galas T. Quentin Ore, MD, [facility], [facility] Cardiac Electrophysiology

## 2021-12-30 NOTE — Pre-Procedure Instructions (Signed)
Instructed patient on the following items: Arrival time 1130 Nothing to eat or drink after midnight No meds AM of procedure Responsible person to drive you home and stay with you for 24 hrs   

## 2021-12-30 NOTE — Telephone Encounter (Signed)
Kirke Adriana Hendricks called and will be getting the medication from there main pharmacy location.

## 2021-12-30 NOTE — Telephone Encounter (Signed)
In house pharmD med option. Need to order 5 mg tablet patient to take 2.5 mg. Notified patient. Left voice mail for [facility] to call back and verify they have or can get medication for patient today.

## 2021-12-31 ENCOUNTER — Encounter (HOSPITAL_COMMUNITY)
Admission: RE | Disposition: A | Discharge status: Home / Self Care | Payer: Self-pay | Source: Home / Self Care | Attending: Cardiology

## 2021-12-31 ENCOUNTER — Other Ambulatory Visit: Payer: Self-pay

## 2021-12-31 ENCOUNTER — Ambulatory Visit (HOSPITAL_COMMUNITY)
Admission: RE | Admit: 2021-12-31 | Discharge: 2021-12-31 | Disposition: A | Discharge status: Home / Self Care | Payer: Medicare Other | Attending: Cardiology | Admitting: Cardiology

## 2021-12-31 ENCOUNTER — Ambulatory Visit (HOSPITAL_COMMUNITY): Payer: Medicare Other

## 2021-12-31 DIAGNOSIS — Z01818 Encounter for other preprocedural examination: Secondary | ICD-10-CM

## 2021-12-31 DIAGNOSIS — Z952 Presence of prosthetic heart valve: Secondary | ICD-10-CM | POA: Diagnosis not present

## 2021-12-31 DIAGNOSIS — Z950 Presence of cardiac pacemaker: Secondary | ICD-10-CM | POA: Diagnosis not present

## 2021-12-31 DIAGNOSIS — R55 Syncope and collapse: Secondary | ICD-10-CM | POA: Insufficient documentation

## 2021-12-31 DIAGNOSIS — I442 Atrioventricular block, complete: Secondary | ICD-10-CM | POA: Diagnosis not present

## 2021-12-31 DIAGNOSIS — I517 Cardiomegaly: Secondary | ICD-10-CM | POA: Diagnosis not present

## 2021-12-31 DIAGNOSIS — I4891 Unspecified atrial fibrillation: Secondary | ICD-10-CM | POA: Diagnosis not present

## 2021-12-31 DIAGNOSIS — Z7901 Long term (current) use of anticoagulants: Secondary | ICD-10-CM

## 2021-12-31 HISTORY — PX: PACEMAKER IMPLANT: EP1218

## 2021-12-31 LAB — PACEMAKER DEVICE OBSERVATION

## 2021-12-31 LAB — PROTIME-INR
INR: 1.6 — ABNORMAL HIGH (ref 0.8–1.2)
Prothrombin Time: 19.1 seconds — ABNORMAL HIGH (ref 11.4–15.2)

## 2021-12-31 SURGERY — PACEMAKER IMPLANT

## 2021-12-31 MED ORDER — VANCOMYCIN HCL IN DEXTROSE 1-5 GM/200ML-% IV SOLN: 1000.0000 mg | INTRAVENOUS | Status: DC

## 2021-12-31 MED ORDER — VANCOMYCIN HCL 1000 MG IV SOLR
INTRAVENOUS | Status: DC | PRN
  Administered 2021-12-31: 1000 mg via INTRAVENOUS

## 2021-12-31 MED ORDER — CHLORHEXIDINE GLUCONATE 4 % EX LIQD
4.0000 "application " | Freq: Once | CUTANEOUS | Status: DC
  Filled 2021-12-31: qty 60

## 2021-12-31 MED ORDER — FENTANYL CITRATE (PF) 100 MCG/2ML IJ SOLN
INTRAMUSCULAR | Status: DC | PRN
  Administered 2021-12-31: 25 ug via INTRAVENOUS
  Administered 2021-12-31: 12.5 ug via INTRAVENOUS
  Administered 2021-12-31: 25 ug via INTRAVENOUS

## 2021-12-31 MED ORDER — LIDOCAINE HCL (PF) 1 % IJ SOLN
INTRAMUSCULAR | Status: DC | PRN
  Administered 2021-12-31: 50 mL

## 2021-12-31 MED ORDER — MIDAZOLAM HCL 5 MG/5ML IJ SOLN
INTRAMUSCULAR | Status: DC | PRN
  Administered 2021-12-31: .5 mg via INTRAVENOUS
  Administered 2021-12-31 (×2): 1 mg via INTRAVENOUS

## 2021-12-31 MED ORDER — MIDAZOLAM HCL 5 MG/5ML IJ SOLN
INTRAMUSCULAR | Status: AC
  Filled 2021-12-31: qty 5

## 2021-12-31 MED ORDER — ONDANSETRON HCL 4 MG/2ML IJ SOLN: 4.0000 mg | Freq: Four times a day (QID) | INTRAMUSCULAR | Status: DC | PRN

## 2021-12-31 MED ORDER — FENTANYL CITRATE (PF) 100 MCG/2ML IJ SOLN
INTRAMUSCULAR | Status: AC
  Filled 2021-12-31: qty 2

## 2021-12-31 MED ORDER — ACETAMINOPHEN 325 MG PO TABS: 325.0000 mg | ORAL_TABLET | ORAL | Status: DC | PRN

## 2021-12-31 MED ORDER — VANCOMYCIN HCL IN DEXTROSE 1-5 GM/200ML-% IV SOLN
INTRAVENOUS | Status: AC
  Filled 2021-12-31: qty 200

## 2021-12-31 MED ORDER — HEPARIN (PORCINE) IN NACL 1000-0.9 UT/500ML-% IV SOLN
INTRAVENOUS | Status: DC | PRN
  Administered 2021-12-31 (×2): 500 mL

## 2021-12-31 MED ORDER — SODIUM CHLORIDE 0.9 % IV SOLN: INTRAVENOUS | Status: DC

## 2021-12-31 MED ORDER — POVIDONE-IODINE 10 % EX SWAB
2.0000 "application " | Freq: Once | CUTANEOUS | Status: AC
  Administered 2021-12-31: 2 via TOPICAL

## 2021-12-31 MED ORDER — SODIUM CHLORIDE 0.9 % IV SOLN
INTRAVENOUS | Status: AC
  Filled 2021-12-31: qty 2

## 2021-12-31 MED ORDER — LIDOCAINE HCL (PF) 1 % IJ SOLN
INTRAMUSCULAR | Status: AC
  Filled 2021-12-31: qty 60

## 2021-12-31 MED ORDER — IOHEXOL 350 MG/ML SOLN
INTRAVENOUS | Status: DC | PRN
  Administered 2021-12-31: 5 mL

## 2021-12-31 MED ORDER — SODIUM CHLORIDE 0.9 % IV SOLN
80.0000 mg | INTRAVENOUS | Status: AC
  Administered 2021-12-31: 80 mg

## 2021-12-31 MED ORDER — HEPARIN (PORCINE) IN NACL 1000-0.9 UT/500ML-% IV SOLN
INTRAVENOUS | Status: AC
  Filled 2021-12-31: qty 500

## 2021-12-31 SURGICAL SUPPLY — 12 items
CABLE SURGICAL S-101-97-12 (CABLE) ×2 IMPLANT
KIT ACCESSORY SELECTRA FIX CVD (MISCELLANEOUS) ×1 IMPLANT
LEAD SELECTRA 3D-55-42 (CATHETERS) ×1 IMPLANT
LEAD SOLIA S PRO MRI 53 (Lead) ×1 IMPLANT
LEAD SOLIA S PRO MRI 60 (Lead) ×1 IMPLANT
PACEMAKER EDORA 8DR-T MRI (Pacemaker) ×1 IMPLANT
PAD DEFIB RADIO PHYSIO CONN (PAD) ×3 IMPLANT
SHEATH 7FR PRELUDE SNAP 13 (SHEATH) ×1 IMPLANT
SHEATH 9FR PRELUDE SNAP 13 (SHEATH) ×1 IMPLANT
SHEATH PROBE COVER 6X72 (BAG) ×1 IMPLANT
TRAY PACEMAKER INSERTION (PACKS) ×2 IMPLANT
WIRE HI TORQ VERSACORE-J 145CM (WIRE) ×1 IMPLANT

## 2021-12-31 NOTE — Discharge Instructions (Signed)
° ° °  Supplemental Discharge Instructions for  Pacemaker/Defibrillator Patients  Tomorrow, 01/01/22, send in a device transmission  Activity No heavy lifting or vigorous activity with your left/right arm for 6 to 8 weeks.  Do not raise your left/right arm above your head for one week.  Gradually raise your affected arm as drawn below.             01/04/22                     01/05/22                    01/06/22                     01/07/22 __  NO DRIVING for 1 week   ; you may begin driving on  5/78/46   .  WOUND CARE Keep the wound area clean and dry.  Do not get this area wet , no showers for one week; you may shower on  01/07/22  . Tomorrow, 01/01/22, remove the arm sling Tomorrow, 01/01/22 remove the LARGE outer plastic bandage.  Underneath the plastic bandage there are steri strips (paper tapes), DO NOT remove these. The tape/steri-strips on your wound will fall off; do not pull them off.  No bandage is needed on the site.  DO  NOT apply any creams, oils, or ointments to the wound area. If you notice any drainage or discharge from the wound, any swelling or bruising at the site, or you develop a fever > 101? F after you are discharged home, call the office at once.  Special Instructions You are still able to use cellular telephones; use the ear opposite the side where you have your pacemaker/defibrillator.  Avoid carrying your cellular phone near your device. When traveling through airports, show security personnel your identification card to avoid being screened in the metal detectors.  Ask the security personnel to use the hand wand. Avoid arc welding equipment, MRI testing (magnetic resonance imaging), TENS units (transcutaneous nerve stimulators).  Call the office for questions about other devices. Avoid electrical appliances that are in poor condition or are not properly grounded. Microwave ovens are safe to be near or to operate.

## 2021-12-31 NOTE — Interval H&P Note (Signed)
History and Physical Interval Note:  12/31/2021 2:03 PM  Adriana Hendricks  has presented today for surgery, with the diagnosis of heart block.  The various methods of treatment have been discussed with the patient and family. After consideration of risks, benefits and other options for treatment, the patient has consented to  Procedure(s): PACEMAKER IMPLANT (N/A) as a surgical intervention.  The patient's history has been reviewed, patient examined, no change in status, stable for surgery.  I have reviewed the patient's chart and labs.  Questions were answered to the patient's satisfaction.     Ekta Dancer T Estanislado Surgeon

## 2022-01-03 ENCOUNTER — Encounter (HOSPITAL_COMMUNITY): Payer: Self-pay | Admitting: Cardiology

## 2022-01-12 ENCOUNTER — Other Ambulatory Visit: Payer: Self-pay

## 2022-01-12 ENCOUNTER — Ambulatory Visit (INDEPENDENT_AMBULATORY_CARE_PROVIDER_SITE_OTHER): Payer: Medicare Other

## 2022-01-12 DIAGNOSIS — I495 Sick sinus syndrome: Secondary | ICD-10-CM

## 2022-01-12 NOTE — Patient Instructions (Addendum)
° °  After Your Pacemaker   Monitor your pacemaker site for redness, swelling, and drainage. Call the device clinic at [phone] if you experience these symptoms or fever/chills.  Your incision was closed with Steri-strips or staples:  You may shower and wash your incision with soap and water. Avoid lotions, ointments, or perfumes over your incision until it is well-healed.  You may use a hot tub or a pool after your wound check appointment if the incision is completely closed.  Do not lift, push or pull greater than 10 pounds with the affected arm until 6 weeks after your procedure. There are no other restrictions in arm movement after your wound check appointment.    You may drive, unless driving has been restricted by your healthcare providers.  Your Pacemaker is MRI compatible.  Remote monitoring is used to monitor your pacemaker from home. This monitoring is scheduled every 91 days by our office. It allows [place] to keep an eye on the functioning of your device to ensure it is working properly. You will routinely see your Electrophysiologist annually (more often if necessary).

## 2022-01-13 LAB — CUP PACEART INCLINIC DEVICE CHECK
Brady Statistic RA Percent Paced: 86 %
Brady Statistic RV Percent Paced: 0 %
Date Time Interrogation Session: 20230119140941
Implantable Lead Implant Date: 20230106
Implantable Lead Implant Date: 20230106
Implantable Lead Location: 753858
Implantable Lead Location: 753859
Implantable Lead Model: 377171
Implantable Lead Model: 377171
Implantable Lead Serial Number: 8000603602
Implantable Lead Serial Number: 8000650012
Implantable Pulse Generator Implant Date: 20230106
Lead Channel Pacing Threshold Amplitude: 0.6 V
Lead Channel Pacing Threshold Amplitude: 0.8 V
Lead Channel Pacing Threshold Pulse Width: 0.4 ms
Lead Channel Pacing Threshold Pulse Width: 0.4 ms
Lead Channel Sensing Intrinsic Amplitude: 1.4 mV
Lead Channel Sensing Intrinsic Amplitude: 8 mV
Pulse Gen Model: 407145
Pulse Gen Serial Number: 70291848

## 2022-01-13 NOTE — Progress Notes (Signed)
Wound check appointment. Steri-strips removed. Wound without redness or edema. Incision edges approximated, wound well healed. Normal device function. Thresholds, sensing, and impedances consistent with implant measurements. Device programmed at 3.0V/auto capture programmed on for extra safety margin until 3 month visit. Histogram distribution appropriate for patient and level of activity. No mode switches or high ventricular rates noted. Patient educated about wound care, arm mobility, lifting restrictions. Patient enrolled in remote monitoring with next transmission scheduled 04/04/22. 91 day FU with Dr. Quentin Ore 04/06/22. Error in retrieving from programmer. See attachment for testing results.

## 2022-01-20 ENCOUNTER — Other Ambulatory Visit: Payer: Self-pay

## 2022-01-20 ENCOUNTER — Ambulatory Visit (INDEPENDENT_AMBULATORY_CARE_PROVIDER_SITE_OTHER): Payer: Medicare Other

## 2022-01-20 DIAGNOSIS — Z7901 Long term (current) use of anticoagulants: Secondary | ICD-10-CM

## 2022-01-20 DIAGNOSIS — I4819 Other persistent atrial fibrillation: Secondary | ICD-10-CM

## 2022-01-20 LAB — POCT INR: INR: 1.7 — AB (ref 2.0–3.0)

## 2022-01-20 NOTE — Patient Instructions (Signed)
TAKE 2 TABLETS TONIGHT ONLY and then Continue with 1 tablet daily except 2 tablets each Monday, Wednesday and Friday.  Repeat INR in 3 weeks. [facility] [phone]

## 2022-02-10 ENCOUNTER — Other Ambulatory Visit: Payer: Self-pay

## 2022-02-10 ENCOUNTER — Ambulatory Visit (INDEPENDENT_AMBULATORY_CARE_PROVIDER_SITE_OTHER): Payer: Medicare Other

## 2022-02-10 DIAGNOSIS — I4819 Other persistent atrial fibrillation: Secondary | ICD-10-CM

## 2022-02-10 DIAGNOSIS — Z7901 Long term (current) use of anticoagulants: Secondary | ICD-10-CM

## 2022-02-10 LAB — POCT INR: INR: 2.3 (ref 2.0–3.0)

## 2022-02-10 NOTE — Patient Instructions (Signed)
Continue with 1 tablet daily except 2 tablets each Monday, Wednesday and Friday.  Repeat INR in 6 weeks. [facility] Clinic [phone]

## 2022-02-15 DIAGNOSIS — L814 Other melanin hyperpigmentation: Secondary | ICD-10-CM | POA: Diagnosis not present

## 2022-02-15 DIAGNOSIS — L821 Other seborrheic keratosis: Secondary | ICD-10-CM | POA: Diagnosis not present

## 2022-02-15 DIAGNOSIS — D229 Melanocytic nevi, unspecified: Secondary | ICD-10-CM | POA: Diagnosis not present

## 2022-02-15 DIAGNOSIS — L570 Actinic keratosis: Secondary | ICD-10-CM | POA: Diagnosis not present

## 2022-02-15 DIAGNOSIS — L820 Inflamed seborrheic keratosis: Secondary | ICD-10-CM | POA: Diagnosis not present

## 2022-02-15 DIAGNOSIS — D1800 Hemangioma unspecified site: Secondary | ICD-10-CM | POA: Diagnosis not present

## 2022-03-08 DIAGNOSIS — Z20822 Contact with and (suspected) exposure to covid-19: Secondary | ICD-10-CM | POA: Diagnosis not present

## 2022-03-24 ENCOUNTER — Ambulatory Visit (INDEPENDENT_AMBULATORY_CARE_PROVIDER_SITE_OTHER): Payer: Medicare Other

## 2022-03-24 DIAGNOSIS — Z7901 Long term (current) use of anticoagulants: Secondary | ICD-10-CM

## 2022-03-24 DIAGNOSIS — I4819 Other persistent atrial fibrillation: Secondary | ICD-10-CM | POA: Diagnosis not present

## 2022-03-24 LAB — POCT INR: INR: 3.4 — AB (ref 2.0–3.0)

## 2022-03-24 NOTE — Patient Instructions (Signed)
HOLD TONIGHT ONLY and then Continue with 1 tablet daily except 2 tablets each Monday, Wednesday and Friday.  Repeat INR in 3 weeks. Coumadin Clinic [phone] ?

## 2022-03-30 ENCOUNTER — Ambulatory Visit (HOSPITAL_COMMUNITY): Payer: Medicare Other | Attending: Cardiovascular Disease

## 2022-03-30 DIAGNOSIS — I071 Rheumatic tricuspid insufficiency: Secondary | ICD-10-CM | POA: Insufficient documentation

## 2022-03-30 DIAGNOSIS — Z9889 Other specified postprocedural states: Secondary | ICD-10-CM | POA: Diagnosis not present

## 2022-03-30 DIAGNOSIS — I051 Rheumatic mitral insufficiency: Secondary | ICD-10-CM | POA: Insufficient documentation

## 2022-03-30 DIAGNOSIS — Q231 Congenital insufficiency of aortic valve: Secondary | ICD-10-CM | POA: Diagnosis not present

## 2022-03-30 DIAGNOSIS — I4819 Other persistent atrial fibrillation: Secondary | ICD-10-CM | POA: Insufficient documentation

## 2022-03-30 HISTORY — PX: TRANSTHORACIC ECHOCARDIOGRAM: SHX275

## 2022-03-30 LAB — ECHOCARDIOGRAM COMPLETE
AR max vel: 1.23 cm2
AV Area VTI: 1.18 cm2
AV Area mean vel: 1.17 cm2
AV Mean grad: 12 mmHg
AV Peak grad: 20.8 mmHg
Ao pk vel: 2.28 m/s
Area-P 1/2: 5.54 cm2
MV VTI: 1.47 cm2
S' Lateral: 3.2 cm

## 2022-04-04 ENCOUNTER — Ambulatory Visit (INDEPENDENT_AMBULATORY_CARE_PROVIDER_SITE_OTHER): Payer: Medicare Other

## 2022-04-04 DIAGNOSIS — I495 Sick sinus syndrome: Secondary | ICD-10-CM | POA: Diagnosis not present

## 2022-04-05 LAB — CUP PACEART REMOTE DEVICE CHECK
Date Time Interrogation Session: 20230410141710
Implantable Lead Implant Date: 20230106
Implantable Lead Implant Date: 20230106
Implantable Lead Location: 753858
Implantable Lead Location: 753859
Implantable Lead Model: 377171
Implantable Lead Model: 377171
Implantable Lead Serial Number: 8000603602
Implantable Lead Serial Number: 8000650012
Implantable Pulse Generator Implant Date: 20230106
Pulse Gen Model: 407145
Pulse Gen Serial Number: 70291848

## 2022-04-05 NOTE — Progress Notes (Deleted)
Electrophysiology Office Follow up Visit Note:    Date:  04/05/2022   ID:  Adriana Hendricks, DOB [birth year], MRN [id number]  PCP:  Shelva Majestic, MD  [facility] Cardiologist:  Bryan Lemma, MD  [facility] Electrophysiologist:  Lanier Prude, MD    Interval History:    Adriana Hendricks is a 87 y.o. female who presents for a follow up visit after permanent pacemaker implant December 31, 2021 for symptomatic paroxysmal complete heart block.  She has a dual-chamber pacemaker in situ.       Past Medical History:  Diagnosis Date   Achilles tendon rupture    Arthritis of hand, right    Atrial flutter with rapid ventricular response (HCC)    Carpal tunnel syndrome 09/04/2013   Collagenous colitis 2005   Colon polyps 2010   Tubular adenoma and hyperplastic   Dental infection 10/25/2014   Diverticulosis    Esophageal stenosis    Essential tremor    GERD (gastroesophageal reflux disease)    hx hiatal hernia, hx esophagitis, hx stricture   Hiatal hernia    Hypertension    Lower extremity neuropathy 08/23/2013   On B12 therapy with numbness in the feet bilaterally no evidence of diabetes    Ocular migraine    jagged vision, a few per month   OSA (obstructive sleep apnea) 12/21/2016   on CPAP   Peripheral neuropathy    treated by Dr Allena Katz (07/2015)   Persistent atrial fibrillation (HCC)    Rate control with beta-blocker and anticoagulated with warfarin   Pleural effusion, left    S/P Maze operation for atrial fibrillation 06/12/2019   Complete bilateral atrial lesion set using cryothermy and bipolar radiofrequency ablation with clipping of LA appendage via right mini thoracotomy approach   S/P minimally invasive tricuspid valve repair 06/12/2019   Complex valvuloplasty including autologous pericardial patch augmentation of anterior and posterior leaflets with 28 mm Edwards mc3 ring annuloplasty via right mini thoracotomy approach   Severe tricuspid regurgitation 07/2018    Noted Aug 2019 during a fib workup. Severe LAE as well   Sinus node dysfunction (HCC)     Past Surgical History:  Procedure Laterality Date   48 hr Holter Monitor  07/2018   Persistent Afib (rate 33 - 124 bpm)   APPENDECTOMY     CARDIAC CATHETERIZATION     CARDIOVERSION N/A 10/02/2018   Procedure: CARDIOVERSION;  Surgeon: Elease Hashimoto Deloris Ping, MD;  Location: [facility] ENDOSCOPY;  Service: Cardiovascular;  Laterality: N/A;   CARPAL TUNNEL RELEASE     CATARACT EXTRACTION     EXCISION MORTON'S NEUROMA     Right foot   EYE SURGERY     Cataracts with implants-bilateral   INTRAOCULAR LENS IMPLANT, SECONDARY     IR THORACENTESIS ASP PLEURAL SPACE W/IMG GUIDE  07/10/2019   IR THORACENTESIS ASP PLEURAL SPACE W/IMG GUIDE  07/10/2019   IR THORACENTESIS ASP PLEURAL SPACE W/IMG GUIDE  07/26/2019   MINIMALLY INVASIVE MAZE PROCEDURE N/A 06/12/2019   Procedure: MINIMALLY INVASIVE MAZE PROCEDURE;  Surgeon: Purcell Nails, MD;  Location: [facility] OR;  Service: Open Heart Surgery;  Laterality: N/A;   MINIMALLY INVASIVE TRICUSPID VALVE REPAIR Right 06/12/2019   Procedure: MINIMALLY INVASIVE TRICUSPID VALVE REPAIR USING EDWARDS MC3 T28 ANNULOPLASTY RING, INSERTION TEMPORARY TRANSVENOUS AV PACING LEAD;  Surgeon: Purcell Nails, MD;  Location: [facility] OR;  Service: Open Heart Surgery;  Laterality: Right;   Moles removed     MOUTH SURGERY     (  For Exostosis)   NUCLEAR  07/2018   EF 71 %. LOW RISK - no ischemia or Infarct.    PACEMAKER IMPLANT N/A 12/31/2021   Procedure: PACEMAKER IMPLANT;  Surgeon: Lanier Prude, MD;  Location: [facility] INVASIVE CV LAB;  Service: Cardiovascular;  Laterality: N/A;   RIGHT/LEFT HEART CATH AND CORONARY ANGIOGRAPHY N/A 01/16/2019   Procedure: RIGHT/LEFT HEART CATH AND CORONARY ANGIOGRAPHY;  Surgeon: Marykay Lex, MD;  Location: [facility] INVASIVE CV LAB;; Angiographically normal coronary arteries.  Normal LVEDP. Upper Limit of Normal PAP~23 mmHg & PCWP 18 mmHg.  LVEDP of 7 mmHg. -With mean PAP of 23  mmHg there is a TPG suggesting a primary pulmonary etiology.   ROTATOR CUFF REPAIR     TEE WITHOUT CARDIOVERSION N/A 01/16/2019   Procedure: TRANSESOPHAGEAL ECHOCARDIOGRAM (TEE);  Surgeon: Quintella Reichert, MD;  Location: [facility] ENDOSCOPY;;  Severe TR due to poor coaptation of the leaflets from annular dilation.  Mild to moderate mitral vegetation with mildly restricted mobility of the posterior leaflet.  EF 55-60% with no R WMA.  Severe RA and LA dilation.   TEE WITHOUT CARDIOVERSION N/A 06/12/2019   Procedure: TRANSESOPHAGEAL ECHOCARDIOGRAM (TEE);  Surgeon: Purcell Nails, MD;  Location: [facility] OR;  Service: Open Heart Surgery;  Laterality: N/A;   TONSILLECTOMY     TRANSTHORACIC ECHOCARDIOGRAM  08/05/2019   First postop TAVR: EF 60 to 65%. Mod concentric LVH. Grade III DD (? w/ Mild LA dilation).  Rheumatic MV w/ mod thickening & Ca2+.  Anterior leaflet has mild doming but no MVP.  Mild-mod MR and mild to mod MS (mean MVA gradient 7 mmHg).  ~ functional bicuspid AoV w/ Mod Sclerosis - no AS.  Thoracic Aorta (4.1 & 3.7 @ Sinus of  V, prox Ascending) - mild dilation   TRANSTHORACIC ECHOCARDIOGRAM  09/08/2021   EF 60 to 65%.  No R WMA.  Unable to assess diastolic function.  Only mild LA dilation.  Severely elevated PAP-64.5 mmHg.  Mild to moderate RA dilation.  Moderate MR.  Repaired TV has moderate-severe TR    Current Medications: No outpatient medications have been marked as taking for the 04/06/22 encounter (Appointment) with Lanier Prude, MD.     Allergies:   Levofloxacin, Potassium chloride, Clarithromycin, Erythromycin base, Metronidazole, and Penicillins   Social History   Socioeconomic History   Marital status: Married    Spouse name: Not on file   Number of children: 2   Years of education: 16   Highest education level: Not on file  Occupational History   Occupation: Retired  Tobacco Use   Smoking status: Former    Packs/day: 2.00    Years: 13.00    Pack years: 26.00     Types: Cigarettes    Start date: 12/26/1954    Quit date: 03/26/1968    Years since quitting: 54.0   Smokeless tobacco: Never   Tobacco comments:    Quit in 1969 - smokeed 2-3PPD , was 3 PPD at her heaviest for about 3 years.   Vaping Use   Vaping Use: Never used  Substance and Sexual Activity   Alcohol use: Yes    Alcohol/week: 2.0 standard drinks    Types: 2 Glasses of wine per week    Comment: 2 glass of wine per day   Drug use: No   Sexual activity: Yes  Other Topics Concern   Not on file  Social History Narrative   Married. Husband is patient of Dr. Woodward Ku  Married 1958. 2 children. 4 grandkids (3 girls, 1 boy).    Retired from [place] [employer].   Hobbies: genealogy and DNA   Right handed    One story      Recently moved to a retirement community in [place] (spring 2022).  Taking advantage of the fact that she does not have to cook every day.  Is also taking advantage of the multiple daily exercise classes.  She tries to do classes at least 3 days a week if not more.  She is also doing much more walking around the community.      She and her husband are in the process of selling the house.  Hopefully close in July 2022   Social Determinants of Health   Financial Resource Strain: Not on file  Food Insecurity: Not on file  Transportation Needs: Not on file  Physical Activity: Not on file  Stress: Not on file  Social Connections: Not on file     Family History: The patient's family history includes Barrett's esophagus in her son; Heart failure in her father; Other in her mother; Rectal cancer in her father. There is no history of Colon cancer, Esophageal cancer, or Stomach cancer.  ROS:   Please see the history of present illness.    All other systems reviewed and are negative.  EKGs/Labs/Other Studies Reviewed:    The following studies were reviewed today: ***  EKG:  The ekg ordered today demonstrates ***  Recent Labs: 09/07/2021:  ALT 18; TSH 3.280 12/29/2021: BUN 27; Creatinine, Ser 0.92; Hemoglobin 13.9; Platelets 191; Potassium 3.9; Sodium 141  Recent Lipid Panel    Component Value Date/Time   CHOL 224 (H) 09/07/2021 0827   TRIG 189 (H) 09/07/2021 0827   HDL 57 09/07/2021 0827   CHOLHDL 3.9 09/07/2021 0827   CHOLHDL 3 04/02/2020 1527   VLDL 29.0 04/02/2020 1527   LDLCALC 134 (H) 09/07/2021 0827   LDLDIRECT 128.8 01/17/2011 0941    Physical Exam:    VS:  LMP  (LMP Unknown)     Wt Readings from Last 3 Encounters:  12/31/21 171 lb (77.6 kg)  12/29/21 171 lb 6.4 oz (77.7 kg)  12/01/21 170 lb (77.1 kg)     GEN: *** Well nourished, well developed in no acute distress HEENT: Normal NECK: No JVD; No carotid bruits LYMPHATICS: No lymphadenopathy CARDIAC: ***RRR, no murmurs, rubs, gallops RESPIRATORY:  Clear to auscultation without rales, wheezing or rhonchi  ABDOMEN: Soft, non-tender, non-distended MUSCULOSKELETAL:  No edema; No deformity  SKIN: Warm and dry NEUROLOGIC:  Alert and oriented x 3 PSYCHIATRIC:  Normal affect        ASSESSMENT:    No diagnosis found. PLAN:    In order of problems listed above:  Follow-up 1 year         Total time spent with patient today *** minutes. This includes reviewing records, evaluating the patient and coordinating care.   Medication Adjustments/Labs and Tests Ordered: Current medicines are reviewed at length with the patient today.  Concerns regarding medicines are outlined above.  No orders of the defined types were placed in this encounter.  No orders of the defined types were placed in this encounter.    Signed, Steffanie Dunn, MD, [facility], [facility] 04/05/2022 11:13 PM    Electrophysiology [facility]

## 2022-04-06 ENCOUNTER — Encounter: Payer: Self-pay | Admitting: Cardiology

## 2022-04-06 ENCOUNTER — Ambulatory Visit (INDEPENDENT_AMBULATORY_CARE_PROVIDER_SITE_OTHER): Payer: Medicare Other | Admitting: Cardiology

## 2022-04-06 VITALS — BP 130/78 | HR 71 | Ht 65.0 in | Wt 168.2 lb

## 2022-04-06 DIAGNOSIS — Z950 Presence of cardiac pacemaker: Secondary | ICD-10-CM | POA: Diagnosis not present

## 2022-04-06 DIAGNOSIS — I442 Atrioventricular block, complete: Secondary | ICD-10-CM

## 2022-04-06 DIAGNOSIS — I4819 Other persistent atrial fibrillation: Secondary | ICD-10-CM

## 2022-04-06 DIAGNOSIS — Z9889 Other specified postprocedural states: Secondary | ICD-10-CM

## 2022-04-06 DIAGNOSIS — Z8679 Personal history of other diseases of the circulatory system: Secondary | ICD-10-CM

## 2022-04-06 NOTE — Progress Notes (Signed)
?Electrophysiology Office Follow up Visit Note:   ? ?Date:  04/06/2022  ? ?ID:  Adriana Hendricks, DOB Apr 28, 1935, MRN [id number] ? ?PCP:  Marin Olp, MD  ?[facility] Cardiologist:  Glenetta Hew, MD  ?[facility] Electrophysiologist:  Vickie Epley, MD  ? ? ?Interval History:   ? ?Adriana Hendricks is a 87 y.o. female who presents for a follow up visit after permanent pacemaker implant December 31, 2021 for symptomatic paroxysmal complete heart block.  She has a dual-chamber pacemaker in situ. ? ?She is accompanied by her husband. Today, she is feeling alright overall. She reports that she has a life alerting device that she is required to wear around her neck.  ? ?She asks if her heart rate is able to be increased. Lately she has been feeling short of breath with exertion. ? ?She denies any palpitations, chest pain, or peripheral edema. No lightheadedness, headaches, syncope, orthopnea, or PND. ? ? ?  ? ?Past Medical History:  ?Diagnosis Date  ? Achilles tendon rupture   ? Arthritis of hand, right   ? Atrial flutter with rapid ventricular response ([place])   ? Carpal tunnel syndrome 09/04/2013  ? Collagenous colitis 2005  ? Colon polyps 2010  ? Tubular adenoma and hyperplastic  ? Dental infection 10/25/2014  ? Diverticulosis   ? Esophageal stenosis   ? Essential tremor   ? GERD (gastroesophageal reflux disease)   ? hx hiatal hernia, hx esophagitis, hx stricture  ? Hiatal hernia   ? Hypertension   ? Lower extremity neuropathy 08/23/2013  ? On B12 therapy with numbness in the feet bilaterally no evidence of diabetes   ? Ocular migraine   ? jagged vision, a few per month  ? OSA (obstructive sleep apnea) 12/21/2016  ? on CPAP  ? Peripheral neuropathy   ? treated by Dr Posey Pronto (07/2015)  ? Persistent atrial fibrillation ([place])   ? Rate control with beta-blocker and anticoagulated with warfarin  ? Pleural effusion, left   ? S/P Maze operation for atrial fibrillation 06/12/2019  ? Complete bilateral atrial lesion set using  cryothermy and bipolar radiofrequency ablation with clipping of LA appendage via right mini thoracotomy approach  ? S/P minimally invasive tricuspid valve repair 06/12/2019  ? Complex valvuloplasty including autologous pericardial patch augmentation of anterior and posterior leaflets with 28 mm Edwards mc3 ring annuloplasty via right mini thoracotomy approach  ? Severe tricuspid regurgitation 07/2018  ? Noted Aug 2019 during a fib workup. Severe LAE as well  ? Sinus node dysfunction (HCC)   ? ? ?Past Surgical History:  ?Procedure Laterality Date  ? 48 hr Holter Monitor  07/2018  ? Persistent Afib (rate 33 - 124 bpm)  ? APPENDECTOMY    ? CARDIAC CATHETERIZATION    ? CARDIOVERSION N/A 10/02/2018  ? Procedure: CARDIOVERSION;  Surgeon: Thayer Headings, MD;  Location: [place];  Service: Cardiovascular;  Laterality: N/A;  ? CARPAL TUNNEL RELEASE    ? CATARACT EXTRACTION    ? EXCISION MORTON'S NEUROMA    ? Right foot  ? EYE SURGERY    ? Cataracts with implants-bilateral  ? INTRAOCULAR LENS IMPLANT, SECONDARY    ? IR THORACENTESIS ASP PLEURAL SPACE W/IMG GUIDE  07/10/2019  ? IR THORACENTESIS ASP PLEURAL SPACE W/IMG GUIDE  07/10/2019  ? IR THORACENTESIS ASP PLEURAL SPACE W/IMG GUIDE  07/26/2019  ? MINIMALLY INVASIVE MAZE PROCEDURE N/A 06/12/2019  ? Procedure: MINIMALLY INVASIVE MAZE PROCEDURE;  Surgeon: Rexene Alberts, MD;  Location: [facility]  OR;  Service: Open Heart Surgery;  Laterality: N/A;  ? MINIMALLY INVASIVE TRICUSPID VALVE REPAIR Right 06/12/2019  ? Procedure: MINIMALLY INVASIVE TRICUSPID VALVE REPAIR USING EDWARDS MC3 T28 ANNULOPLASTY RING, INSERTION TEMPORARY TRANSVENOUS AV PACING LEAD;  Surgeon: Rexene Alberts, MD;  Location: [place];  Service: Open Heart Surgery;  Laterality: Right;  ? Moles removed    ? MOUTH SURGERY    ? (For Exostosis)  ? NUCLEAR  07/2018  ? EF 71 %. LOW RISK - no ischemia or Infarct.   ? PACEMAKER IMPLANT N/A 12/31/2021  ? Procedure: PACEMAKER IMPLANT;  Surgeon: Vickie Epley, MD;   Location: [place] CV LAB;  Service: Cardiovascular;  Laterality: N/A;  ? RIGHT/LEFT HEART CATH AND CORONARY ANGIOGRAPHY N/A 01/16/2019  ? Procedure: RIGHT/LEFT HEART CATH AND CORONARY ANGIOGRAPHY;  Surgeon: Leonie Man, MD;  Location: [place] CV LAB;; Angiographically normal coronary arteries.  Normal LVEDP. Upper Limit of Normal PAP~23 mmHg & PCWP 18 mmHg.  LVEDP of 7 mmHg. -With mean PAP of 23 mmHg there is a TPG suggesting a primary pulmonary etiology.  ? ROTATOR CUFF REPAIR    ? TEE WITHOUT CARDIOVERSION N/A 01/16/2019  ? Procedure: TRANSESOPHAGEAL ECHOCARDIOGRAM (TEE);  Surgeon: Sueanne Margarita, MD;  Location: [facility] ENDOSCOPY;;  Severe TR due to poor coaptation of the leaflets from annular dilation.  Mild to moderate mitral vegetation with mildly restricted mobility of the posterior leaflet.  EF 55-60% with no R WMA.  Severe RA and LA dilation.  ? TEE WITHOUT CARDIOVERSION N/A 06/12/2019  ? Procedure: TRANSESOPHAGEAL ECHOCARDIOGRAM (TEE);  Surgeon: Rexene Alberts, MD;  Location: [place];  Service: Open Heart Surgery;  Laterality: N/A;  ? TONSILLECTOMY    ? TRANSTHORACIC ECHOCARDIOGRAM  08/05/2019  ? First postop TAVR: EF 60 to 65%. Mod concentric LVH. Grade III DD (? w/ Mild LA dilation).  Rheumatic MV w/ mod thickening & Ca2+.  Anterior leaflet has mild doming but no MVP.  Mild-mod MR and mild to mod MS (mean MVA gradient 7 mmHg).  ~ functional bicuspid AoV w/ Mod Sclerosis - no AS.  Thoracic Aorta (4.1 & 3.7 @ Sinus of  V, prox Ascending) - mild dilation  ? TRANSTHORACIC ECHOCARDIOGRAM  09/08/2021  ? EF 60 to 65%.  No R WMA.  Unable to assess diastolic function.  Only mild LA dilation.  Severely elevated PAP-64.5 mmHg.  Mild to moderate RA dilation.  Moderate MR.  Repaired TV has moderate-severe TR  ? ? ?Current Medications: ?Current Meds  ?Medication Sig  ? calcium carbonate (TUMS - DOSED IN MG ELEMENTAL CALCIUM) 500 MG chewable tablet Chew 1 tablet by mouth daily.   ? cholecalciferol (VITAMIN D3)  25 MCG (1000 UT) tablet Take 1,000 Units by mouth daily.  ? famotidine (PEPCID) 20 MG tablet Take 20 mg by mouth at bedtime.  ? furosemide (LASIX) 20 MG tablet Take 1 tablet (20 mg total) by mouth daily. May take an additional 20 mg if needed for swelling or shortness of breath  ? metoprolol tartrate (LOPRESSOR) 25 MG tablet Take 1 tablet (25 mg total) by mouth as needed.  ? Multiple Vitamins-Minerals (PRESERVISION AREDS 2 PO) Take 1 capsule by mouth in the morning and at bedtime.  ? nadolol (CORGARD) 20 MG tablet Take 1 tablet (20 mg total) by mouth daily.  ? potassium chloride 20 MEQ/15ML (10%) SOLN TAKE 15 ML BY MOUTH ONCE DAILY *DILUTE WITH 4-8 OUNCES OF LIQUID* *TAKE WITH FOOD*  ? TURMERIC PO Take 500  mg by mouth daily.  ? vitamin B-12 (CYANOCOBALAMIN) 250 MCG tablet Take 250 mcg by mouth daily.  ? warfarin (COUMADIN) 2.5 MG tablet TAKE 1 TO 2 TABLETS BY MOUTH ONCE DAILY OR AS INSTRUCTED BY COUMADIN CLINIC  ?  ? ?Allergies:   Levofloxacin, Potassium chloride, Clarithromycin, Erythromycin base, Metronidazole, and Penicillins  ? ?Social History  ? ?Socioeconomic History  ? Marital status: Married  ?  Spouse name: Not on file  ? Number of children: 2  ? Years of education: 58  ? Highest education level: Not on file  ?Occupational History  ? Occupation: Retired  ?Tobacco Use  ? Smoking status: Former  ?  Packs/day: 2.00  ?  Years: 13.00  ?  Pack years: 26.00  ?  Types: Cigarettes  ?  Start date: 12/26/1954  ?  Quit date: 03/26/1968  ?  Years since quitting: 54.0  ? Smokeless tobacco: Never  ? Tobacco comments:  ?  Quit in 1969 - smokeed 2-3PPD , was 3 PPD at her heaviest for about 3 years.   ?Vaping Use  ? Vaping Use: Never used  ?Substance and Sexual Activity  ? Alcohol use: Yes  ?  Alcohol/week: 2.0 standard drinks  ?  Types: 2 Glasses of wine per week  ?  Comment: 2 glass of wine per day  ? Drug use: No  ? Sexual activity: Yes  ?Other Topics Concern  ? Not on file  ?Social History Narrative  ? Married. Husband is  patient of Dr. Thurnell Garbe  ? Married 1958. 2 children. 4 grandkids (3 girls, 1 boy).   ? Retired from [place] [employer].  ? Hobbies: genealogy and DNA  ? Right handed   ? One story  ?

## 2022-04-06 NOTE — Patient Instructions (Signed)
Medication Instructions:  ?Your physician recommends that you continue on your current medications as directed. Please refer to the Current Medication list given to you today. ?*If you need a refill on your cardiac medications before your next appointment, please call your pharmacy* ? ?Lab Work: ?None. ?If you have labs (blood work) drawn today and your tests are completely normal, you will receive your results only by: ?MyChart Message (if you have MyChart) OR ?A paper copy in the mail ?If you have any lab test that is abnormal or we need to change your treatment, we will call you to review the results. ? ?Testing/Procedures: ?None. ? ?Follow-Up: ?At [facility], you and your health needs are our priority.  As part of our continuing mission to provide you with exceptional heart care, we have created designated Provider Care Teams.  These Care Teams include your primary Cardiologist (physician) and Advanced Practice Providers (APPs -  Physician Assistants and Nurse Practitioners) who all work together to provide you with the care you need, when you need it. ? ?Your physician wants you to follow-up in: 12 months with  or one of the following Advanced Practice Providers on your designated Care Team:   ? ?Tommye Standard, PA-C ?Legrand Como "Jonni Sanger" [place], PA-C ?  You will receive a reminder letter in the mail two months in advance. If you don't receive a letter, please call our office to schedule the follow-up appointment. ? ?We recommend signing up for the patient portal called "MyChart".  Sign up information is provided on this After Visit Summary.  MyChart is used to connect with patients for Virtual Visits (Telemedicine).  Patients are able to view lab/test results, encounter notes, upcoming appointments, etc.  Non-urgent messages can be sent to your provider as well.   ?To learn more about what you can do with MyChart, go to [email or web address].   ? ?Any Other Special Instructions Will Be Listed Below (If  Applicable). ? ? ? ? ?  ? ? ?

## 2022-04-11 ENCOUNTER — Encounter: Payer: Self-pay | Admitting: Cardiology

## 2022-04-11 ENCOUNTER — Ambulatory Visit (INDEPENDENT_AMBULATORY_CARE_PROVIDER_SITE_OTHER): Payer: Medicare Other

## 2022-04-11 ENCOUNTER — Ambulatory Visit (INDEPENDENT_AMBULATORY_CARE_PROVIDER_SITE_OTHER): Payer: Medicare Other | Admitting: Cardiology

## 2022-04-11 VITALS — BP 122/68 | HR 72 | Ht 65.0 in | Wt 170.8 lb

## 2022-04-11 DIAGNOSIS — I442 Atrioventricular block, complete: Secondary | ICD-10-CM

## 2022-04-11 DIAGNOSIS — Z9889 Other specified postprocedural states: Secondary | ICD-10-CM

## 2022-04-11 DIAGNOSIS — E785 Hyperlipidemia, unspecified: Secondary | ICD-10-CM

## 2022-04-11 DIAGNOSIS — I051 Rheumatic mitral insufficiency: Secondary | ICD-10-CM

## 2022-04-11 DIAGNOSIS — I071 Rheumatic tricuspid insufficiency: Secondary | ICD-10-CM

## 2022-04-11 DIAGNOSIS — Q231 Congenital insufficiency of aortic valve: Secondary | ICD-10-CM | POA: Diagnosis not present

## 2022-04-11 DIAGNOSIS — I4819 Other persistent atrial fibrillation: Secondary | ICD-10-CM

## 2022-04-11 DIAGNOSIS — Z7901 Long term (current) use of anticoagulants: Secondary | ICD-10-CM | POA: Diagnosis not present

## 2022-04-11 DIAGNOSIS — I10 Essential (primary) hypertension: Secondary | ICD-10-CM | POA: Diagnosis not present

## 2022-04-11 DIAGNOSIS — I83893 Varicose veins of bilateral lower extremities with other complications: Secondary | ICD-10-CM | POA: Diagnosis not present

## 2022-04-11 LAB — POCT INR: INR: 2.8 (ref 2.0–3.0)

## 2022-04-11 NOTE — Patient Instructions (Signed)
Continue with 1 tablet daily except 2 tablets each Monday, Wednesday and Friday.  Repeat INR in 6 weeks. [facility] [phone] ?

## 2022-04-11 NOTE — Patient Instructions (Addendum)
Medication Instructions:   No changes *If you need a refill on your cardiac medications before your next appointment, please call your pharmacy*   Lab Work: Not needed .   Testing/Procedures:  Not needed  Follow-Up: At [facility], you and your health needs are our priority.  As part of our continuing mission to provide you with exceptional heart care, we have created designated Provider Care Teams.  These Care Teams include your primary Cardiologist (physician) and Advanced Practice Providers (APPs -  Physician Assistants and Nurse Practitioners) who all work together to provide you with the care you need, when you need it.     Your next appointment:   6 month(s)  The format for your next appointment:   In Person  Provider:   David Harding, MD   Other Instructions 

## 2022-04-11 NOTE — Progress Notes (Signed)
Primary Care Provider: Marin Olp, MD Cardiologist: Adriana Hew, MD Electrophysiologist: Adriana Epley, MD  Clinic Note: Chief Complaint  Patient presents with   Follow-up    6 months with me: Has had PPM placed since last visit   Cardiac Valve Problem    History of TAVR, residual moderate to severe TR and moderate MR as well as mild AS => follow-up echo reviewed   Loss of Consciousness    Called in with symptoms that can be seen by neurology.  Zio patch monitor revealed pauses.  Now status post PPM.  No further syncope.   ===================================  ASSESSMENT/PLAN   Problem List Items Addressed This Visit       Cardiology Problems   Rheumatic mitral regurgitation (Chronic)    Following surgery, she still has moderate to severe TR and moderate MR.  During initial surgery, there was consideration of possibly doing all 3 valves, but felt that with her age she would not tolerate it.  So far she seems to be tolerating the PRN MR and the aortic valve is stayed relatively stable.  Does not have a lot of blood pressure at work with, so we have not really been focusing on afterload reduction.  Just using long-acting beta-blocker to keep her heart rate slow.  If symptoms worsen, could consider mitral needed potentially tricuspid clipping.       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  Initial description did not seem cardiac, but the only potential cardiac etiology that could explain her symptoms would be bradycardia or pauses.  Therefore monitor was ordered revealing pauses, which led to her being referred to Dr. Quentin Hendricks for PPM placement.  No further syncopal episodes.  Rate responsiveness turned up.       Hyperlipidemia with target LDL less than 100 (Chronic)    After long discussion during her last visit, we decided that we will berry to hold off on treating lipids given her advanced age.  She is to  try new medicines to treat lipids-sliding concerns about adverse effects.       Essential hypertension (Chronic)    Blood pressure looks great on Corgard.       Persistent atrial fibrillation (HCC) (Chronic)    As far as I can tell, she has not really had any breakthrough episodes of A-fib since her valve surgery with maze and LAA clipping.  On rate control with Corgard-now with pacemaker placed  She remains on warfarin for anticoagulation mostly because of her rheumatic valvular disease, but clearly okay to hold warfarin for procedures or surgeries without bridging:  Has not had any recurrence LA appendage clipping      Tricuspid valve insufficiency (Chronic)    Persistent finding of moderate severe TR despite M repair..  I still think that this is better than it had been.  She is pretty much asymptomatic.  She is not overly active, but what she does she is doing fine.  I do not think we need to recheck another echocardiogram for now unless symptoms warrant.       Bicuspid aortic valve (Chronic)    Mild aortic stenosis on echo. Can consider reassessing in 3 years or otherwise sooner if symptoms warrant.       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  Continue to recommend  foot elevation, support stockings and standing dose of furosemide.  Very rare use of sliding scale Lasix, but this is probably because she does not like how much makes her urinate.         Other   Long term (current) use of anticoagulants: CHA2DS2Vasc 5, WARFARIN - Rheumatic Vavle Disease (Chronic)    CHA2DS2-VASc score is 4-5 but she has had maze and LAA clipping.  She still has dilated atria with prosthetic ring increasing her risk of stroke regardless.  Remains on warfarin, but find to hold warfarin without bridging for procedures.  We will simply hold 4 to 5 days preop.  Does not need bridging.        S/P minimally invasive tricuspid valve repair - Primary (Chronic)    ===================================  HPI:    Adriana Hendricks is a 87 y.o. female with a PMH below who presents today for 73-monthfollow-up-with interim placement of PPM for CHB. She follows up today at the request of HMarin Olp MD.  Notable PMH: PERSISTENT ATRIAL FIBRILLATION Remains on warfarin, despite LAA clip and MAZE. No record of recurrence, however sent 3-4 episodes of presumed tachycardia. SEVERE RHEUMATIC TR =>  s/p TVR/MAZE/LAA CLIP 05/2019 Follow-up Echos still show Moderate to Severe TR MODERATE MR FUNCTIONALLY BICUSPID AORTIC VALVE-mild AS COMPLETE HEART BLOCK -  January 2023:  Dual-Chamber PPM (Left Bundle Lead),   I last saw BMatilde Sprangon October 05, 2021 to review her echocardiogram from September 2022 revealing normal EF with elevated PAP and moderate to severe TR despite TV repair.  Also moderate MR noted.  Despite having the abnormal findings on her echo, she really denied any significant dyspnea or or chest tightness with rest or exertion.  Was feeling well.  [place]  Was getting much more exercise than she had been doing.  Maybe not eating as well. ->  Took additional metoprolol for palpitations or higher than usual heart rate/blood pressure 3 times in 3 months.  No CHF symptoms of PND orthopnea or edema.  Took 1 additional dose of furosemide after a high salt meal.  Home BPs ranging from 130-140/70s from the staff nurses.  Not using CPAP.  Much less dyspnea when not wearing the mask. => No medication changes.  Working to avoid statin. Echo ordered for April 2023.  Recommend low-dose furosemide, support stockings and foot elevation.  December 5-phone call with symptoms of slurred speech, decreased responsiveness lasting 30 to 45 seconds..  This was concerning for severe bradycardia-recommended 2-week monitor. => Monitor applied  12/05/2021 => results showed complete heart block with pauses. => Referred directly to Dr. LQuentin Hendricks-> seen on December 29, 2021, PPM scheduled for January 6.  Recent Hospitalizations:  December 31, 2021: Biotronik Dual-Chamber PPM (with left bundle area lead) placement for COMPLETE HEART BLOCK  She was just seen by Dr. LQuentin Oreon April 06, 2022 for follow-up of her Dual-Chamber PPM (implant December 31, 2021 for symptomatic paroxysmal complete heart block).  She noted a little bit of exertional dyspnea and asked if her heart rate responsiveness can be turned up.  Otherwise no palpitations, chest pain or PND orthopnea edema.  Was in sinus rhythm.  Reviewed  CV studies:    The following studies were reviewed today: (if available, images/films reviewed: From Epic Chart or Care Everywhere) PPM Implant-December 31, 2021: Dual-chamber PPM with left bundle area lead placement. Initial interrogation April 06, 2022 stable. Rate response turned on TTE  March 30, 2022: EF 55 to 60%.  No RWMA.  Mild LVH.  Indeterminate filling pressures.  Mild LA dilation.  Mildly elevated PAP with Moderate to severe TR, and moderate RA dilation -> normal RAP.  Aortic valve appears to be functionally bicuspid with fused left and right cusps with moderate calcification.  Mild AS.  Aortic root dilated to 42 mm.    Interval History:   Adriana Hendricks returns here today for the first time I am seeing her since her pacemaker placed.  Thankfully she has not had any further syncope episodes.  She was happy that her rates were just increased and she thinks that now she is having less fatigue. The only abnormal symptoms she is noting now is an intermittent headache.    Pretty much stable from a cardiac standpoint.  Her edema is pretty well controlled.  She takes her Lasix daily and maybe 2 or 3 times in the last 6 months she is taking additional dose.  Interestingly, since her pacemaker she has not had to use any additional dose of metoprolol.   She simply stays on her Corgard.  She is trying to stay as active as possible.  Doing well the activities at the [facility] community.  However she is not as active as she had once been and therefore has some exertional dyspnea but no chest pain or pressure with rest or exertion.  Actually, since her rate responsiveness is turned up, she is feeling better.  Despite having edema, no PND orthopnea.  No further syncope since PPM placement.  No TIA significance.  No claudication.  REVIEWED OF SYSTEMS   Review of Systems  Constitutional:  Positive for malaise/fatigue (Much better since rate responsiveness turned up). Negative for weight loss.  HENT:  Negative for congestion and nosebleeds.   Respiratory:  Negative for cough and shortness of breath (Only if she overdoes it).   Cardiovascular:        Per HPI  Gastrointestinal:  Negative for abdominal pain, blood in stool and melena.  Genitourinary:  Positive for frequency. Negative for hematuria.  Musculoskeletal:  Positive for joint pain. Negative for falls and myalgias.  Neurological:  Positive for dizziness (Notably improved). Negative for weakness.  Psychiatric/Behavioral: Negative.     I have reviewed and (if needed) personally updated the patient's problem list, medications, allergies, past medical and surgical history, social and family history.   PAST MEDICAL HISTORY   Past Medical History:  Diagnosis Date   Achilles tendon rupture    Arthritis of hand, right    Atrial flutter with rapid ventricular response ([place])    Carpal tunnel syndrome 09/04/2013   Collagenous colitis 2005   Colon polyps 2010   Tubular adenoma and hyperplastic   Dental infection 10/25/2014   Diverticulosis    Esophageal stenosis    Essential tremor    GERD (gastroesophageal reflux disease)    hx hiatal hernia, hx esophagitis, hx stricture   Hiatal hernia    Hypertension    Lower extremity neuropathy 08/23/2013   On B12 therapy with numbness in the feet  bilaterally no evidence of diabetes    Ocular migraine    jagged vision, a few per month   OSA (obstructive sleep apnea) 12/21/2016   on CPAP   Peripheral neuropathy    treated by Dr Posey Pronto (07/2015)   Persistent atrial fibrillation ([place])    Rate control with beta-blocker and anticoagulated with warfarin   Pleural effusion, left    S/P Maze operation  for atrial fibrillation 06/12/2019   Complete bilateral atrial lesion set using cryothermy and bipolar radiofrequency ablation with clipping of LA appendage via right mini thoracotomy approach   S/P minimally invasive tricuspid valve repair 06/12/2019   Complex valvuloplasty including autologous pericardial patch augmentation of anterior and posterior leaflets with 28 mm Edwards mc3 ring annuloplasty via right mini thoracotomy approach   Severe tricuspid regurgitation 07/2018   Noted Aug 2019 during a fib workup. Severe LAE as well   Sinus node dysfunction (HCC)     PAST SURGICAL HISTORY   Past Surgical History:  Procedure Laterality Date   48 hr Holter Monitor  07/2018   Persistent Afib (rate 33 - 124 bpm)   APPENDECTOMY     CARDIAC CATHETERIZATION     CARDIOVERSION N/A 10/02/2018   Procedure: CARDIOVERSION;  Surgeon: Acie Fredrickson Wonda Cheng, MD;  Location: [facility];  Service: Cardiovascular;  Laterality: N/A;   CARPAL TUNNEL RELEASE     CATARACT EXTRACTION     EXCISION MORTON'S NEUROMA     Right foot   EYE SURGERY     Cataracts with implants-bilateral   INTRAOCULAR LENS IMPLANT, SECONDARY     IR THORACENTESIS ASP PLEURAL SPACE W/IMG GUIDE  07/10/2019   IR THORACENTESIS ASP PLEURAL SPACE W/IMG GUIDE  07/10/2019   IR THORACENTESIS ASP PLEURAL SPACE W/IMG GUIDE  07/26/2019   MINIMALLY INVASIVE MAZE PROCEDURE N/A 06/12/2019   Procedure: MINIMALLY INVASIVE MAZE PROCEDURE;  Surgeon: Rexene Alberts, MD;  Location: [place];  Service: Open Heart Surgery;  Laterality: N/A;   MINIMALLY INVASIVE TRICUSPID VALVE REPAIR Right 06/12/2019    Procedure: MINIMALLY INVASIVE TRICUSPID VALVE REPAIR USING EDWARDS MC3 T28 ANNULOPLASTY RING, INSERTION TEMPORARY TRANSVENOUS AV PACING LEAD;  Surgeon: Rexene Alberts, MD;  Location: [facility];  Service: Open Heart Surgery;  Laterality: Right;   Moles removed     MOUTH SURGERY     (For Exostosis)   NUCLEAR  07/2018   EF 71 %. LOW RISK - no ischemia or Infarct.    PACEMAKER IMPLANT N/A 12/31/2021   Procedure: PACEMAKER IMPLANT;  Surgeon: Adriana Epley, MD;  Location: [facility];  Service: Cardiovascular;  Laterality: N/A;   RIGHT/LEFT HEART CATH AND CORONARY ANGIOGRAPHY N/A 01/16/2019   Procedure: RIGHT/LEFT HEART CATH AND CORONARY ANGIOGRAPHY;  Surgeon: Leonie Man, MD;  Location: [place] [facility];; Angiographically normal coronary arteries.  Normal LVEDP. Upper Limit of Normal PAP~23 mmHg & PCWP 18 mmHg.  LVEDP of 7 mmHg. -With mean PAP of 23 mmHg there is a TPG suggesting a primary pulmonary etiology.   ROTATOR CUFF REPAIR     TEE WITHOUT CARDIOVERSION N/A 01/16/2019   Procedure: TRANSESOPHAGEAL ECHOCARDIOGRAM (TEE);  Surgeon: Sueanne Margarita, MD;  Location: [facility];;  Severe TR due to poor coaptation of the leaflets from annular dilation.  Mild to moderate mitral vegetation with mildly restricted mobility of the posterior leaflet.  EF 55-60% with no R WMA.  Severe RA and LA dilation.   TEE WITHOUT CARDIOVERSION N/A 06/12/2019   Procedure: TRANSESOPHAGEAL ECHOCARDIOGRAM (TEE);  Surgeon: Rexene Alberts, MD;  Location: [facility];  Service: Open Heart Surgery;  Laterality: N/A;   TONSILLECTOMY     TRANSTHORACIC ECHOCARDIOGRAM  08/05/2019   First postop TAVR: EF 60 to 65%. Mod concentric LVH. Grade III DD (? w/ Mild LA dilation).  Rheumatic MV w/ mod thickening & Ca2+.  Anterior leaflet has mild doming but no MVP.  Mild-mod MR and mild to mod  MS (mean MVA gradient 7 mmHg).  ~ functional bicuspid AoV w/ Mod Sclerosis - no AS.  Thoracic Aorta (4.1 & 3.7 @ Sinus of  V, prox Ascending)  - mild dilation   TRANSTHORACIC ECHOCARDIOGRAM  09/08/2021   EF 60 to 65%.  No R WMA.  Unable to assess diastolic function.  Only mild LA dilation.  Severely elevated PAP-64.5 mmHg.  Mild to moderate RA dilation.  Moderate MR.  Repaired TV has moderate-severe TR    Immunization History  Administered Date(s) Administered   Fluad Quad(high Dose 65+) 10/08/2019, 10/12/2020, 10/13/2021   Influenza Split 12/01/2011, 11/05/2012   Influenza Whole 01/07/2010, 10/08/2010   Influenza, High Dose Seasonal PF 10/01/2014, 11/10/2015, 10/25/2016, 10/30/2017, 10/22/2018   Influenza,inj,Quad PF,6+ Mos 08/23/2013   PFIZER(Purple Top)SARS-COV-2 Vaccination 01/04/2020, 01/25/2020, 09/22/2020, 04/08/2021   Pfizer Covid-19 Vaccine Bivalent Booster 66yr & up 09/09/2021   Pneumococcal Conjugate-13 04/13/2015   Pneumococcal Polysaccharide-23 12/26/2005   Td 12/26/2001   Tdap 01/23/2012   Zoster Recombinat (Shingrix) 12/02/2020, 05/21/2021   Zoster, Live 02/08/2010    MEDICATIONS/ALLERGIES   Current Meds  Medication Sig   calcium carbonate (TUMS - DOSED IN MG ELEMENTAL CALCIUM) 500 MG chewable tablet Chew 1 tablet by mouth daily.    cholecalciferol (VITAMIN D3) 25 MCG (1000 UT) tablet Take 1,000 Units by mouth daily.   famotidine (PEPCID) 20 MG tablet Take 20 mg by mouth at bedtime.   furosemide (LASIX) 20 MG tablet Take 1 tablet (20 mg total) by mouth daily. May take an additional 20 mg if needed for swelling or shortness of breath   Multiple Vitamins-Minerals (PRESERVISION AREDS 2 PO) Take 1 capsule by mouth in the morning and at bedtime.   nadolol (CORGARD) 20 MG tablet Take 1 tablet (20 mg total) by mouth daily.   potassium chloride 20 MEQ/15ML (10%) SOLN TAKE 15 ML BY MOUTH ONCE DAILY *DILUTE WITH 4-8 OUNCES OF LIQUID* *TAKE WITH FOOD*   TURMERIC PO Take 500 mg by mouth daily.   vitamin B-12 (CYANOCOBALAMIN) 250 MCG tablet Take 250 mcg by mouth daily.   warfarin (COUMADIN) 2.5 MG tablet TAKE 1 TO 2  TABLETS BY MOUTH ONCE DAILY OR AS INSTRUCTED BY COUMADIN CLINIC    Allergies  Allergen Reactions   Levofloxacin Other (See Comments)    Developed tendon pain. (Has a history of a Achilles tendon tear)   Potassium Chloride Other (See Comments)    Can't take tables   Clarithromycin Nausea Only   Erythromycin Base Nausea And Vomiting   Metronidazole Rash   Penicillins Rash and Other (See Comments)    Did it involve swelling of the face/tongue/throat, SOB, or low BP? No Did it involve sudden or severe rash/hives, skin peeling, or any reaction on the inside of your mouth or nose? No Did you need to seek medical attention at a hospital or doctor's office? No When did it last happen?  as a child If all above answers are "NO", may proceed with cephalosporin use.     SOCIAL HISTORY/FAMILY HISTORY   Reviewed in Epic:  Pertinent findings:  Social History   Tobacco Use   Smoking status: Former    Packs/day: 2.00    Years: 13.00    Pack years: 26.00    Types: Cigarettes    Start date: 12/26/1954    Quit date: 03/26/1968    Years since quitting: 54.1   Smokeless tobacco: Never   Tobacco comments:    Quit in 1969 - smokeed 2-3PPD , was 3  PPD at her heaviest for about 3 years.   Vaping Use   Vaping Use: Never used  Substance Use Topics   Alcohol use: Yes    Alcohol/week: 2.0 standard drinks    Types: 2 Glasses of wine per week    Comment: 2 glass of wine per day   Drug use: No   Social History   Social History Narrative   Married. Husband is patient of Dr. Thurnell Garbe   Married 1958. 2 children. 4 grandkids (3 girls, 1 boy).    Retired from [employer].   Hobbies: genealogy and DNA   Right handed    One story      Recently moved to a retirement community in [place] (spring 2022).  Taking advantage of the fact that she does not have to cook every day.  Is also taking advantage of the multiple daily exercise classes.  She tries to do classes at  least 3 days a week if not more.  She is also doing much more walking around the community.      She and her husband are in the process of selling the house.  Hopefully close in July 2022    OBJCTIVE -PE, EKG, labs   Wt Readings from Last 3 Encounters:  05/02/22 170 lb 9.6 oz (77.4 kg)  04/11/22 170 lb 12.8 oz (77.5 kg)  04/06/22 168 lb 3.2 oz (76.3 kg)    Physical Exam: BP 122/68 (BP Location: Left Arm, Patient Position: Sitting, Cuff Size: Normal)   Pulse 72   Ht '5\' 5"'$  (1.651 m)   Wt 170 lb 12.8 oz (77.5 kg)   LMP  (LMP Unknown)   SpO2 95%   BMI 28.42 kg/m  Physical Exam Vitals reviewed.  Constitutional:      General: She is not in acute distress.    Appearance: Normal appearance. She is normal weight. She is not ill-appearing or toxic-appearing.  HENT:     Head: Normocephalic and atraumatic.  Neck:     Vascular: Hepatojugular reflux and JVD (8 cm water.  Pulsatile) present. No carotid bruit.  Cardiovascular:     Rate and Rhythm: Normal rate and regular rhythm. Occasional Extrasystoles are present.    Chest Wall: PMI is not displaced.     Pulses: Normal pulses.     Heart sounds: S1 normal and S2 normal. Heart sounds are distant. Murmur heard.  Harsh crescendo-decrescendo early systolic murmur is present with a grade of 1/6 at the upper right sternal border radiating to the neck.  High-pitched blowing holosystolic murmur of grade 1/6 is also present at the lower left sternal border, lower right sternal border and apex radiating to the back and apex. Heard in 2 locations (likely combination of MR and TR)    No friction rub. No gallop.  Pulmonary:     Effort: Pulmonary effort is normal. No respiratory distress.     Breath sounds: Normal breath sounds. No wheezing, rhonchi or rales.  Musculoskeletal:     Cervical back: Normal range of motion and neck supple.  Skin:    General: Skin is warm and dry.     Coloration: Skin is not jaundiced.  Neurological:     General: No  focal deficit present.     Mental Status: She is alert and oriented to person, place, and time.     Motor: No weakness.     Gait: Gait normal.  Psychiatric:        Mood and  Affect: Mood normal.        Behavior: Behavior normal.        Thought Content: Thought content normal.        Judgment: Judgment normal.     Adult ECG Report NA  Recent Labs: Reviewed Lab Results  Component Value Date   CHOL 224 (H) 09/07/2021   HDL 57 09/07/2021   LDLCALC 134 (H) 09/07/2021   LDLDIRECT 128.8 01/17/2011   TRIG 189 (H) 09/07/2021   CHOLHDL 3.9 09/07/2021   Lab Results  Component Value Date   CREATININE 1.05 05/02/2022   BUN 24 (H) 05/02/2022   NA 138 05/02/2022   K 4.2 05/02/2022   CL 99 05/02/2022   CO2 30 05/02/2022      Latest Ref Rng & Units 05/02/2022    4:00 PM 12/29/2021    2:42 PM 09/07/2021    8:27 AM  CBC  WBC 4.0 - 10.5 K/uL 9.1   9.0   6.1    Hemoglobin 12.0 - 15.0 g/dL 12.0   13.9   14.0    Hematocrit 36.0 - 46.0 % 35.9   40.8   39.7    Platelets 150.0 - 400.0 K/uL 186.0   191   174      Lab Results  Component Value Date   HGBA1C 5.8 05/02/2022   Lab Results  Component Value Date   TSH 3.280 09/07/2021    ==================================================  COVID-19 Education: The signs and symptoms of COVID-19 were discussed with the patient and how to seek care for testing (follow up with PCP or arrange E-visit).    I spent a total of 26 minutes with the patient spent in direct patient consultation.  Additional time spent with chart review  / charting (studies, outside notes, etc): 22 min Total Time: 48 min  Current medicines are reviewed at length with the patient today.  (+/- concerns) none  This visit occurred during the SARS-CoV-2 public health emergency.  Safety protocols were in place, including screening questions prior to the visit, additional usage of staff PPE, and extensive cleaning of exam room while observing appropriate contact time as  indicated for disinfecting solutions.  Notice: This dictation was prepared with Dragon dictation along with smart phrase technology. Any transcriptional errors that result from this process are unintentional and may not be corrected upon review.  Studies Ordered:   No orders of the defined types were placed in this encounter. No orders of the defined types were placed in this encounter.    Patient Instructions / Medication Changes & Studies & Tests Ordered   Patient Instructions  Medication Instructions:   No changes *If you need a refill on your cardiac medications before your next appointment, please call your pharmacy*   Lab Work: Not needed    Testing/Procedures: Not needed   Follow-Up: At [facility], you and your health needs are our priority.  As part of our continuing mission to provide you with exceptional heart care, we have created designated Provider Care Teams.  These Care Teams include your primary Cardiologist (physician) and Advanced Practice Providers (APPs -  Physician Assistants and Nurse Practitioners) who all work together to provide you with the care you need, when you need it.     Your next appointment:   6 month(s)  The format for your next appointment:   In Person  Provider:   Glenetta Hew, MD    Other Instructions      Adriana Hendricks, M.D., M.S. Interventional Cardiologist  Pager # [phone] Phone # [phone] [address] [place], [place] [address]   Thank you for choosing Heartcare at [facility]!!

## 2022-04-19 NOTE — Progress Notes (Signed)
Remote pacemaker transmission.   

## 2022-04-20 ENCOUNTER — Telehealth: Payer: Self-pay | Admitting: Family Medicine

## 2022-04-20 NOTE — Telephone Encounter (Signed)
Called and lm for pt tcb. 

## 2022-04-20 NOTE — Telephone Encounter (Signed)
Jamas Lav (physical therapist) called asking to see if we received a pt consent form for pt - stated pt is having bad neck and head pain-  ? ?We cannot provide  verbal consent-  ? ?Jamas Lav can be reached at [phone] ?

## 2022-04-21 NOTE — Telephone Encounter (Signed)
Fax has been received and faxed back to [facility].  ?

## 2022-04-21 NOTE — Telephone Encounter (Signed)
Called and lm on pt vm making her aware. 

## 2022-04-22 DIAGNOSIS — M542 Cervicalgia: Secondary | ICD-10-CM | POA: Diagnosis not present

## 2022-04-22 DIAGNOSIS — M6281 Muscle weakness (generalized): Secondary | ICD-10-CM | POA: Diagnosis not present

## 2022-04-25 DIAGNOSIS — M6281 Muscle weakness (generalized): Secondary | ICD-10-CM | POA: Diagnosis not present

## 2022-04-25 DIAGNOSIS — M542 Cervicalgia: Secondary | ICD-10-CM | POA: Diagnosis not present

## 2022-04-28 DIAGNOSIS — M542 Cervicalgia: Secondary | ICD-10-CM | POA: Diagnosis not present

## 2022-04-28 DIAGNOSIS — M6281 Muscle weakness (generalized): Secondary | ICD-10-CM | POA: Diagnosis not present

## 2022-04-28 DIAGNOSIS — Z20822 Contact with and (suspected) exposure to covid-19: Secondary | ICD-10-CM | POA: Diagnosis not present

## 2022-05-02 ENCOUNTER — Encounter: Payer: Self-pay | Admitting: Family Medicine

## 2022-05-02 ENCOUNTER — Ambulatory Visit (INDEPENDENT_AMBULATORY_CARE_PROVIDER_SITE_OTHER): Payer: Medicare Other | Admitting: Family Medicine

## 2022-05-02 VITALS — BP 122/60 | HR 69 | Temp 98.6°F | Ht 65.0 in | Wt 170.6 lb

## 2022-05-02 DIAGNOSIS — I10 Essential (primary) hypertension: Secondary | ICD-10-CM

## 2022-05-02 DIAGNOSIS — I4819 Other persistent atrial fibrillation: Secondary | ICD-10-CM

## 2022-05-02 DIAGNOSIS — I495 Sick sinus syndrome: Secondary | ICD-10-CM | POA: Diagnosis not present

## 2022-05-02 DIAGNOSIS — E785 Hyperlipidemia, unspecified: Secondary | ICD-10-CM | POA: Diagnosis not present

## 2022-05-02 DIAGNOSIS — R739 Hyperglycemia, unspecified: Secondary | ICD-10-CM | POA: Diagnosis not present

## 2022-05-02 DIAGNOSIS — M85852 Other specified disorders of bone density and structure, left thigh: Secondary | ICD-10-CM | POA: Diagnosis not present

## 2022-05-02 DIAGNOSIS — R252 Cramp and spasm: Secondary | ICD-10-CM

## 2022-05-02 DIAGNOSIS — M542 Cervicalgia: Secondary | ICD-10-CM | POA: Diagnosis not present

## 2022-05-02 DIAGNOSIS — M6281 Muscle weakness (generalized): Secondary | ICD-10-CM | POA: Diagnosis not present

## 2022-05-02 MED ORDER — AMOXICILLIN 500 MG PO CAPS: 2000.0000 mg | ORAL_CAPSULE | Freq: Once | ORAL | 3 refills | Status: AC

## 2022-05-02 NOTE — Patient Instructions (Addendum)
Let us know when you get your TDAP at pharmacy. ? ?Call [facility]- we are entering order today for bone density repeat ? ?Please stop by lab before you go ?If you have mychart- we will send your results within 3 business days of [place] receiving them.  ?If you do not have mychart- we will call you about results within 5 business days of [place] receiving them.  ?*please also note that you will see labs on mychart as soon as they post. I will later go in and write notes on them- will say "notes from Dr. Yong Channel"  ? ?Recommended follow up: Return in about 6 months (around 11/02/2022) for followup or sooner if needed.Schedule b4 you leave.  ?

## 2022-05-02 NOTE — Progress Notes (Signed)
?Phone [phone] ?In person visit ?  ?Subjective:  ? ?Adriana Hendricks is a 87 y.o. year old very pleasant female patient who presents for/with See problem oriented charting ?Chief Complaint  ?Patient presents with  ? Follow-up  ? Hypertension  ? dexa  ?  Is pt due for repeat? Last one 2021. ?  ? amoxicillin  ?  Pt would like you to take over this RX for dental procedures.   ? nail issues  ?  Pt c/o nails splitting for many months and wants to know what she should be taking.  ? leg cramps  ?  Pt c/o leg cramps that happen in the mornings that have been going on for months, wonders if magnesium is low.  ? Stye  ?  Pt c/o stye on left eye lid.  ? ? ?Past Medical History-  ?Patient Active Problem List  ? Diagnosis Date Noted  ? Sinus node dysfunction (HCC)   ?  Priority: High  ? S/P minimally invasive tricuspid valve repair 06/12/2019  ?  Priority: High  ? S/P Maze operation for atrial fibrillation 06/12/2019  ?  Priority: High  ? Rheumatic tricuspid valve regurgitation   ?  Priority: High  ? Persistent atrial fibrillation ([place])   ?  Priority: High  ? Osteopenia 03/13/2017  ?  Priority: High  ? Hyperglycemia 10/26/2021  ?  Priority: Medium   ? Bicuspid aortic valve 01/28/2019  ?  Priority: Medium   ? DOE (dyspnea on exertion) 07/26/2018  ?  Priority: Medium   ? Ocular migraine 03/13/2017  ?  Priority: Medium   ? OSA (obstructive sleep apnea) 12/21/2016  ?  Priority: Medium   ? Lower extremity neuropathy 08/23/2013  ?  Priority: Medium   ? Hyperlipidemia with target LDL less than 100 01/07/2010  ?  Priority: Medium   ? GERD with stricture 06/19/2009  ?  Priority: Medium   ? Essential tremor 11/16/2007  ?  Priority: Medium   ? Essential hypertension 11/16/2007  ?  Priority: Medium   ? 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  ?  Priority: Low  ? Tricuspid valve insufficiency   ?  Priority: Low  ? Long term (current) use of anticoagulants: CHA2DS2Vasc 5, WARFARIN - Rheumatic Vavle Disease 08/28/2018  ?  Priority: Low   ? IBS (irritable bowel syndrome) 09/12/2016  ?  Priority: Low  ? History of adenomatous polyp of colon 09/12/2016  ?  Priority: Low  ? Former smoker 09/12/2016  ?  Priority: Low  ? Carpal tunnel syndrome 09/04/2013  ?  Priority: Low  ? Achilles tendon tear 01/17/2011  ?  Priority: Low  ? ACNE ROSACEA 04/03/2008  ?  Priority: Low  ? Varicose veins of lower extremity with edema 06/25/2021  ? Palpitations 11/10/2019  ? Pleural effusion, left   ? ? ?Medications- reviewed and updated ?Current Outpatient Medications  ?Medication Sig Dispense Refill  ? amoxicillin (AMOXIL) 500 MG capsule Take 4 capsules (2,000 mg total) by mouth once for 1 dose. 1 hour prior to dental procedure. PCN  (not anaphylaxis or angioedema) allergy but tolerates amoxicillin 4 capsule 3  ? calcium carbonate (TUMS - DOSED IN MG ELEMENTAL CALCIUM) 500 MG chewable tablet Chew 1 tablet by mouth daily.     ? cholecalciferol (VITAMIN D3) 25 MCG (1000 UT) tablet Take 1,000 Units by mouth daily.    ? famotidine (PEPCID) 20 MG tablet Take 20 mg by mouth at bedtime.    ? metoprolol tartrate (LOPRESSOR) 25 MG  tablet Take 1 tablet (25 mg total) by mouth as needed. 20 tablet 6  ? Multiple Vitamins-Minerals (PRESERVISION AREDS 2 PO) Take 1 capsule by mouth in the morning and at bedtime.    ? nadolol (CORGARD) 20 MG tablet Take 1 tablet (20 mg total) by mouth daily. 90 tablet 3  ? potassium chloride 20 MEQ/15ML (10%) SOLN TAKE 15 ML BY MOUTH ONCE DAILY *DILUTE WITH 4-8 OUNCES OF LIQUID* *TAKE WITH FOOD* 15 mL 3  ? TURMERIC PO Take 500 mg by mouth daily.    ? vitamin B-12 (CYANOCOBALAMIN) 250 MCG tablet Take 250 mcg by mouth daily.    ? warfarin (COUMADIN) 2.5 MG tablet TAKE 1 TO 2 TABLETS BY MOUTH ONCE DAILY OR AS INSTRUCTED BY COUMADIN CLINIC 120 tablet 3  ? furosemide (LASIX) 20 MG tablet Take 1 tablet (20 mg total) by mouth daily. May take an additional 20 mg if needed for swelling or shortness of breath 110 tablet 3  ? ?No current facility-administered  medications for this visit.  ? ?  ?Objective:  ?BP 122/60   Pulse 69   Temp 98.6 ?F (37 ?C)   Ht '5\' 5"'$  (1.651 m)   Wt 170 lb 9.6 oz (77.4 kg)   LMP  (LMP Unknown)   SpO2 96%   BMI 28.39 kg/m?  ?Gen: NAD, resting comfortably ?CV: RRR no murmurs rubs or gallops ?Lungs: CTAB no crackles, wheeze, rhonchi ?Ext: trace edema ?Skin: warm, dry ?  ? ?Assessment and Plan  ? ?Chief Complaint  ?Patient presents with  ? dexa  ?  Is pt due for repeat? Last one 2021. ?-Yes patient is due for DEXA and this was ordered today-SH  ? amoxicillin  ?  Pt would like you to take over this RX for dental procedures ?-was told needed this with valve procedure  ? nail issues  ?  Pt c/o nails splitting for many months and wants to know what she should be taking.prior to January procedure ?-she may ask her dermatologist  ? leg cramps  ?  Pt c/o leg cramps that happen in the mornings that have been going on for months, wonders if magnesium is low. ?-will check magnesium  ? Stye  ?  Pt c/o stye on left eye lid or bite- has been improving  ?- started PT for neck and improving. Thinks was awkward sleep position ? ?# Atrial fibrillation-follows with Dr. [name] ?S: Rate controlled with nadolol 20 mg daily.  Has metoprolol available if needed- hasnt needed ?Anticoagulated with Coumadin ?-a fib maze/tricuspid valve replacement late June 12, 2019 hospitalization ?A/P: rate controlled and anticoagulated appropriately- continue current medicine  ?  ?#Complete heart block/sinus node dysfunction ?S: Permanent pacemaker implant December 31, 2021 ?-Patient prior to this had multiple syncopal episodes arrhythmic in origin- no more episodes since that procedure ?A/P: appears much more stable- thankful for pacemaker- continue cardiology follow up   ? ?% Status post minimally invasive tricuspid valve repair (related to rheumatism)-still with moderate to severe tricuspid regurgitation on 03/30/2022 echocardiogram.  Also with thickening of aortic valve without  significant narrowing/stenosis  ? ?#hypertension and essential tremor ?S: medication: Lasix 20 mg, nadolol 20 mg ?- looks good at home as well usually 120s ?BP Readings from Last 3 Encounters:  ?05/02/22 122/60  ?04/11/22 122/68  ?04/06/22 130/78  ?A/P: Controlled. Continue current medications.  ? ?#hyperlipidemia-over age 39 does not want to add medicine for primary prevention ?S: Medication:none   ?Lab Results  ?Component Value Date  ?  CHOL 224 (H) 09/07/2021  ? HDL 57 09/07/2021  ? LDLCALC 134 (H) 09/07/2021  ? LDLDIRECT 128.8 01/17/2011  ? TRIG 189 (H) 09/07/2021  ? CHOLHDL 3.9 09/07/2021  ?A/P: mild elevations but would not want statin and past age clearly indicated- remain off meds ? ? # Hyperglycemia/insulin resistance/prediabetes-A1c 5.01 September 2021 ?S:  Medication: none ?Exercise and diet- exercised limited by neck- eating as healthy as possible ?Lab Results  ?Component Value Date  ? HGBA1C 5.7 (H) 09/07/2021  ?A/P: hopefully stable or improved- update a1c today. Continue withoutmeds for now ? ?# GERD with history of stricture ?S:Medication: Famotidine 20 mg twice daily.   ?A/P: reasonable control - continue current meds  ?  ?#OSA-compliant with CPAP. Can get new machine after July- 5 year mark ? ?Recommended follow up: Return in about 6 months (around 11/02/2022) for followup or sooner if needed.Schedule b4 you leave. ?Future Appointments  ?Date Time Provider [place]  ?05/26/2022  1:30 PM [facility]  ?07/04/2022  7:05 AM [facility] DEVICE REMOTES [facility]  ?07/11/2022 10:00 AM Chesley Mires, MD [facility] None  ?10/03/2022  7:05 AM [facility] DEVICE REMOTES [facility]  ?10/19/2022  9:20 AM Leonie Man, MD [facility]  ?01/02/2023  7:05 AM [facility] DEVICE REMOTES [facility]  ?04/03/2023  7:05 AM [facility] DEVICE REMOTES [facility]  ?07/03/2023  7:05 AM [facility] DEVICE REMOTES [facility]  ? ?Lab/Order associations: ?  ICD-10-CM   ?1. Osteopenia of left thigh  M85.852 DG Bone Density  ?  ?2. Sinus node dysfunction (HCC)  I49.5   ?  ?3. Persistent atrial fibrillation (HCC)  I48.19   ?  ?4. Es

## 2022-05-03 ENCOUNTER — Encounter: Payer: Self-pay | Admitting: Family Medicine

## 2022-05-03 LAB — COMPREHENSIVE METABOLIC PANEL WITH GFR
ALT: 16 U/L (ref 0–35)
AST: 18 U/L (ref 0–37)
Albumin: 4.1 g/dL (ref 3.5–5.2)
Alkaline Phosphatase: 85 U/L (ref 39–117)
BUN: 24 mg/dL — ABNORMAL HIGH (ref 6–23)
CO2: 30 meq/L (ref 19–32)
Calcium: 9.3 mg/dL (ref 8.4–10.5)
Chloride: 99 meq/L (ref 96–112)
Creatinine, Ser: 1.05 mg/dL (ref 0.40–1.20)
GFR: 48.69 mL/min — ABNORMAL LOW (ref >60.00)
Glucose, Bld: 103 mg/dL — ABNORMAL HIGH (ref 70–99)
Potassium: 4.2 meq/L (ref 3.5–5.1)
Sodium: 138 meq/L (ref 135–145)
Total Bilirubin: 0.6 mg/dL (ref 0.2–1.2)
Total Protein: 7.3 g/dL (ref 6.0–8.3)

## 2022-05-03 LAB — CBC WITH DIFFERENTIAL/PLATELET
Basophils Absolute: 0.1 10*3/uL (ref 0.0–0.1)
Basophils Relative: 0.9 % (ref 0.0–3.0)
Eosinophils Absolute: 0.1 10*3/uL (ref 0.0–0.7)
Eosinophils Relative: 0.7 % (ref 0.0–5.0)
HCT: 35.9 % — ABNORMAL LOW (ref 36.0–46.0)
Hemoglobin: 12 g/dL (ref 12.0–15.0)
Lymphocytes Relative: 13.2 % (ref 12.0–46.0)
Lymphs Abs: 1.2 10*3/uL (ref 0.7–4.0)
MCHC: 33.3 g/dL (ref 30.0–36.0)
MCV: 93.2 fl (ref 78.0–100.0)
Monocytes Absolute: 0.8 10*3/uL (ref 0.1–1.0)
Monocytes Relative: 8.8 % (ref 3.0–12.0)
Neutro Abs: 7 10*3/uL (ref 1.4–7.7)
Neutrophils Relative %: 76.4 % (ref 43.0–77.0)
Platelets: 186 10*3/uL (ref 150.0–400.0)
RBC: 3.85 Mil/uL — ABNORMAL LOW (ref 3.87–5.11)
RDW: 13.5 % (ref 11.5–15.5)
WBC: 9.1 10*3/uL (ref 4.0–10.5)

## 2022-05-03 LAB — MAGNESIUM: Magnesium: 2.2 mg/dL (ref 1.5–2.5)

## 2022-05-03 LAB — HEMOGLOBIN A1C: Hgb A1c MFr Bld: 5.8 % (ref 4.6–6.5)

## 2022-05-05 DIAGNOSIS — M6281 Muscle weakness (generalized): Secondary | ICD-10-CM | POA: Diagnosis not present

## 2022-05-05 DIAGNOSIS — M542 Cervicalgia: Secondary | ICD-10-CM | POA: Diagnosis not present

## 2022-05-10 DIAGNOSIS — L570 Actinic keratosis: Secondary | ICD-10-CM | POA: Diagnosis not present

## 2022-05-10 DIAGNOSIS — D229 Melanocytic nevi, unspecified: Secondary | ICD-10-CM | POA: Diagnosis not present

## 2022-05-10 DIAGNOSIS — L821 Other seborrheic keratosis: Secondary | ICD-10-CM | POA: Diagnosis not present

## 2022-05-11 ENCOUNTER — Telehealth: Payer: Self-pay

## 2022-05-11 DIAGNOSIS — M85852 Other specified disorders of bone density and structure, left thigh: Secondary | ICD-10-CM

## 2022-05-11 NOTE — Telephone Encounter (Signed)
Order has been placed for DEXA with the fax number. ?

## 2022-05-11 NOTE — Telephone Encounter (Signed)
States patient is scheduled for a DEXA on 5/18.  Is requesting a referral/ order to be faxed over to [phone].

## 2022-05-12 ENCOUNTER — Encounter: Payer: Self-pay | Admitting: Cardiology

## 2022-05-12 DIAGNOSIS — M6281 Muscle weakness (generalized): Secondary | ICD-10-CM | POA: Diagnosis not present

## 2022-05-12 DIAGNOSIS — M8589 Other specified disorders of bone density and structure, multiple sites: Secondary | ICD-10-CM | POA: Diagnosis not present

## 2022-05-12 DIAGNOSIS — Z780 Asymptomatic menopausal state: Secondary | ICD-10-CM | POA: Diagnosis not present

## 2022-05-12 DIAGNOSIS — M542 Cervicalgia: Secondary | ICD-10-CM | POA: Diagnosis not present

## 2022-05-12 LAB — HM DEXA SCAN

## 2022-05-12 NOTE — Assessment & Plan Note (Signed)
Interesting symptoms noted by neurology.  Initial description did not seem cardiac, but the only potential cardiac etiology that could explain her symptoms would be bradycardia or pauses.  Therefore monitor was ordered revealing pauses, which led to her being referred to Dr. Quentin Ore for PPM placement.  No further syncopal episodes.  Rate responsiveness turned up.

## 2022-05-12 NOTE — Assessment & Plan Note (Signed)
As far as I can tell, she has not really had any breakthrough episodes of A-fib since her valve surgery with maze and LAA clipping.  On rate control with Corgard-now with pacemaker placed  She remains on warfarin for anticoagulation mostly because of her rheumatic valvular disease, but clearly okay to hold warfarin for procedures or surgeries without bridging:   Has not had any recurrence  LA appendage clipping

## 2022-05-12 NOTE — Assessment & Plan Note (Signed)
Mild aortic stenosis on echo. Can consider reassessing in 3 years or otherwise sooner if symptoms warrant.

## 2022-05-12 NOTE — Assessment & Plan Note (Signed)
Blood pressure looks great on Corgard.

## 2022-05-12 NOTE — Assessment & Plan Note (Signed)
She has venous reflux but also has pretty significant TR as reasons for having swelling.  Continue to recommend foot elevation, support stockings and standing dose of furosemide.  Very rare use of sliding scale Lasix, but this is probably because she does not like how much makes her urinate.

## 2022-05-12 NOTE — Assessment & Plan Note (Signed)
Persistent finding of moderate severe TR despite M repair..  I still think that this is better than it had been.  She is pretty much asymptomatic.  She is not overly active, but what she does she is doing fine.  I do not think we need to recheck another echocardiogram for now unless symptoms warrant.

## 2022-05-12 NOTE — Assessment & Plan Note (Signed)
CHA2DS2-VASc score is 4-5 but she has had maze and LAA clipping.  She still has dilated atria with prosthetic ring increasing her risk of stroke regardless.  Remains on warfarin, but find to hold warfarin without bridging for procedures.  We will simply hold 4 to 5 days preop.  Does not need bridging.

## 2022-05-12 NOTE — Assessment & Plan Note (Signed)
Following surgery, she still has moderate to severe TR and moderate MR.  During initial surgery, there was consideration of possibly doing all 3 valves, but felt that with her age she would not tolerate it.  So far she seems to be tolerating the PRN MR and the aortic valve is stayed relatively stable.  Does not have a lot of blood pressure at work with, so we have not really been focusing on afterload reduction.  Just using long-acting beta-blocker to keep her heart rate slow.  If symptoms worsen, could consider mitral needed potentially tricuspid clipping.

## 2022-05-12 NOTE — Assessment & Plan Note (Addendum)
After long discussion during her last visit, we decided that we will berry to hold off on treating lipids given her advanced age.  She is to try new medicines to treat lipids-sliding concerns about adverse effects.

## 2022-05-16 DIAGNOSIS — M542 Cervicalgia: Secondary | ICD-10-CM | POA: Diagnosis not present

## 2022-05-16 DIAGNOSIS — M6281 Muscle weakness (generalized): Secondary | ICD-10-CM | POA: Diagnosis not present

## 2022-05-18 DIAGNOSIS — M1811 Unilateral primary osteoarthritis of first carpometacarpal joint, right hand: Secondary | ICD-10-CM | POA: Diagnosis not present

## 2022-05-26 ENCOUNTER — Ambulatory Visit (INDEPENDENT_AMBULATORY_CARE_PROVIDER_SITE_OTHER): Payer: Medicare Other

## 2022-05-26 DIAGNOSIS — Z7901 Long term (current) use of anticoagulants: Secondary | ICD-10-CM

## 2022-05-26 DIAGNOSIS — I4819 Other persistent atrial fibrillation: Secondary | ICD-10-CM

## 2022-05-26 LAB — POCT INR: INR: 2.7 (ref 2.0–3.0)

## 2022-05-26 NOTE — Patient Instructions (Signed)
Continue with 1 tablet daily except 2 tablets each Monday, Wednesday and Friday.  Repeat INR in 6 weeks. Coumadin Clinic [phone]

## 2022-06-01 ENCOUNTER — Encounter: Payer: Self-pay | Admitting: Cardiology

## 2022-06-14 DIAGNOSIS — L814 Other melanin hyperpigmentation: Secondary | ICD-10-CM | POA: Diagnosis not present

## 2022-06-14 DIAGNOSIS — L570 Actinic keratosis: Secondary | ICD-10-CM | POA: Diagnosis not present

## 2022-07-04 ENCOUNTER — Ambulatory Visit (INDEPENDENT_AMBULATORY_CARE_PROVIDER_SITE_OTHER): Payer: Medicare Other

## 2022-07-04 DIAGNOSIS — I442 Atrioventricular block, complete: Secondary | ICD-10-CM | POA: Diagnosis not present

## 2022-07-04 LAB — CUP PACEART REMOTE DEVICE CHECK
Date Time Interrogation Session: 20230710085219
Implantable Lead Implant Date: 20230106
Implantable Lead Implant Date: 20230106
Implantable Lead Location: 753858
Implantable Lead Location: 753859
Implantable Lead Model: 377171
Implantable Lead Model: 377171
Implantable Lead Serial Number: 8000603602
Implantable Lead Serial Number: 8000650012
Implantable Pulse Generator Implant Date: 20230106
Pulse Gen Model: 407145
Pulse Gen Serial Number: 70291848

## 2022-07-07 ENCOUNTER — Ambulatory Visit (INDEPENDENT_AMBULATORY_CARE_PROVIDER_SITE_OTHER): Payer: Medicare Other | Admitting: *Deleted

## 2022-07-07 DIAGNOSIS — Z7901 Long term (current) use of anticoagulants: Secondary | ICD-10-CM | POA: Diagnosis not present

## 2022-07-07 DIAGNOSIS — I4819 Other persistent atrial fibrillation: Secondary | ICD-10-CM

## 2022-07-07 LAB — POCT INR: INR: 2.4 (ref 2.0–3.0)

## 2022-07-07 NOTE — Patient Instructions (Signed)
Description   Continue taking 1 tablet daily except 2 tablets each Monday, Wednesday and Friday. Repeat INR in 6 weeks. Coumadin Clinic [phone]     

## 2022-07-11 ENCOUNTER — Ambulatory Visit (INDEPENDENT_AMBULATORY_CARE_PROVIDER_SITE_OTHER): Payer: Medicare Other | Admitting: Pulmonary Disease

## 2022-07-11 ENCOUNTER — Encounter: Payer: Self-pay | Admitting: Pulmonary Disease

## 2022-07-11 VITALS — BP 120/70 | HR 63 | Ht 65.25 in | Wt 163.8 lb

## 2022-07-11 DIAGNOSIS — G4733 Obstructive sleep apnea (adult) (pediatric): Secondary | ICD-10-CM

## 2022-07-11 NOTE — Patient Instructions (Signed)
Call in December to arrange for a new CPAP machine  Follow up in 9 months

## 2022-07-11 NOTE — Progress Notes (Signed)
[facility]  Chief Complaint  Patient presents with   Follow-up    Pt f/u, CPAP is working well pt reports she feels like she's getting adequate sleep    Constitutional:  BP 120/70   Pulse 63   Ht 5' 5.25" (1.657 m)   Wt 163 lb 12.8 oz (74.3 kg)   LMP  (LMP Unknown)   SpO2 98%   BMI 27.05 kg/m   Past Medical History:  TR s/p repair June 2020, A fib s/p MAZE, Neuropathy, HTN, HH, GERD, Essential tremor, Esophageal stenosis, Diverticulosis, Colon polyps, Collagenous colitis, OA  Past Surgical History:  She  has a past surgical history that includes Appendectomy; Tonsillectomy; Cataract extraction; Moles removed; Excision Morton's neuroma; Mouth surgery; Intraocular lens implant, secondary; Carpal tunnel release; Rotator cuff repair; 48 hr Holter Monitor (07/2018); NUCLEAR (07/2018); Cardioversion (N/A, 10/02/2018); TEE without cardioversion (N/A, 01/16/2019); RIGHT/LEFT HEART CATH AND CORONARY ANGIOGRAPHY (N/A, 01/16/2019); Eye surgery; Cardiac catheterization; Minimally invasive tricuspid valve repair (Right, 06/12/2019); Minimally invasive maze procedure (N/A, 06/12/2019); TEE without cardioversion (N/A, 06/12/2019); IR THORACENTESIS ASP PLEURAL SPACE W/IMG GUIDE (07/10/2019); IR THORACENTESIS ASP PLEURAL SPACE W/IMG GUIDE (07/10/2019); IR THORACENTESIS ASP PLEURAL SPACE W/IMG GUIDE (07/26/2019); transthoracic echocardiogram (08/05/2019); transthoracic echocardiogram (09/08/2021); and PACEMAKER IMPLANT (N/A, 12/31/2021).  Brief Summary:  Adriana Hendricks is a 87 y.o. female with obstructive sleep apnea.      Subjective:   She is doing well with CPAP.  Has nasal cushion mask.  No issues with pressure setting.    She had syncopal episodes x 3.  Found to have complete heart block, and had pacemaker inserted.  Better now.  Physical Exam:   Appearance - well kempt   ENMT - no sinus tenderness, no oral exudate, no LAN, Mallampati 3 airway, no  stridor  Respiratory - equal breath sounds bilaterally, no wheezing or rales  CV - s1s2 regular rate and rhythm, 2/6  Ext - no clubbing, no edema  Skin - no rashes  Psych - normal mood and affect    Sleep Tests:  HST 12/14/16 >> AHI 19.5, SaO2 low 78% Auto CPAP 05/09/22 to 07/07/22 >> used on 56 of 60 nights with average 8 hrs 43 min.  Average AHI 0.4 with median CPAP 7 and 95 th percentile CPAP 8 cm H2O  Cardiac Tests:  Echo 03/30/22 >> EF 55 to 60%, mild LVH, mild MR, s/p TVR, mild AS/AR, aortic root 42 mm  Social History:  She  reports that she quit smoking about 54 years ago. Her smoking use included cigarettes. She started smoking about 67 years ago. She has a 26.00 pack-year smoking history. She has never used smokeless tobacco. She reports current alcohol use of about 2.0 standard drinks of alcohol per week. She reports that she does not use drugs.  Family History:  Her family history includes Barrett's esophagus in her son; Heart failure in her father; Other in her mother; Rectal cancer in her father.     Assessment/Plan:   Obstructive sleep apnea. - she is compliant with therapy and reports benefit from CPAP - uses [employer] for her DME - continue auto CPAP 5 to 8 cm H2O - her current CPAP was ordered in December 2018 >> she will call in December 2023 to put order in for new auto CPAP  Persistent atrial fibrillation, rheumatic valve disease, Complete Heart block s/p PM. - followed by Dr. Glenetta Hew and Dr. Lars Mage with cardiology  Time Spent Involved  in Patient Care on Day of Examination:  26 minutes  Follow up:   Patient Instructions  Call in December to arrange for a new CPAP machine  Follow up in 9 months  Medication List:   Allergies as of 07/11/2022       Reactions   Levofloxacin Other (See Comments)   Developed tendon pain. (Has a history of a Achilles tendon tear)   Potassium Chloride Other (See Comments)   Can't take tables    Clarithromycin Nausea Only   Erythromycin Base Nausea And Vomiting   Metronidazole Rash   Penicillins Rash, Other (See Comments)   Did it involve swelling of the face/tongue/throat, SOB, or low BP? No Did it involve sudden or severe rash/hives, skin peeling, or any reaction on the inside of your mouth or nose? No Did you need to seek medical attention at a hospital or doctor's office? No When did it last happen?  as a child If all above answers are "NO", may proceed with cephalosporin use.        Medication List        Accurate as of July 11, 2022 10:25 AM. If you have any questions, ask your nurse or doctor.          calcium carbonate 500 MG chewable tablet Commonly known as: TUMS - dosed in mg elemental calcium Chew 1 tablet by mouth daily.   cholecalciferol 25 MCG (1000 UNIT) tablet Commonly known as: VITAMIN D3 Take 1,000 Units by mouth daily.   famotidine 20 MG tablet Commonly known as: PEPCID Take 20 mg by mouth at bedtime.   furosemide 20 MG tablet Commonly known as: LASIX Take 1 tablet (20 mg total) by mouth daily. May take an additional 20 mg if needed for swelling or shortness of breath   metoprolol tartrate 25 MG tablet Commonly known as: LOPRESSOR Take 1 tablet (25 mg total) by mouth as needed.   nadolol 20 MG tablet Commonly known as: CORGARD Take 1 tablet (20 mg total) by mouth daily.   potassium chloride 20 MEQ/15ML (10%) Soln TAKE 15 ML BY MOUTH ONCE DAILY *DILUTE WITH 4-8 OUNCES OF LIQUID* *TAKE WITH FOOD*   PRESERVISION AREDS 2 PO Take 1 capsule by mouth in the morning and at bedtime.   TURMERIC PO Take 500 mg by mouth daily.   vitamin B-12 250 MCG tablet Commonly known as: CYANOCOBALAMIN Take 250 mcg by mouth daily.   warfarin 2.5 MG tablet Commonly known as: COUMADIN Take as directed by the anticoagulation clinic. If you are unsure how to take this medication, talk to your nurse or doctor. Original instructions: TAKE 1 TO 2 TABLETS BY  MOUTH ONCE DAILY OR AS INSTRUCTED BY COUMADIN CLINIC        Signature:  Chesley Mires, MD [place] Pager - [phone] 07/11/2022, 10:25 AM

## 2022-07-21 ENCOUNTER — Other Ambulatory Visit: Payer: Self-pay | Admitting: Cardiology

## 2022-07-25 ENCOUNTER — Telehealth: Payer: Self-pay | Admitting: Cardiology

## 2022-07-25 MED ORDER — FUROSEMIDE 20 MG PO TABS: 20.0000 mg | ORAL_TABLET | Freq: Every day | ORAL | 3 refills | Status: DC

## 2022-07-25 NOTE — Telephone Encounter (Signed)
-  Pt called stating she was informed by the pharmacy her lasix prescription was expired and questioning if she is to continue. - Pt mad aware per last office visit on 04/11/22, Dr. [name] recommended to continue. -New Rx sent to the requested pharmacy.

## 2022-07-25 NOTE — Telephone Encounter (Signed)
Pt c/o medication issue:  1. Name of Medication:   furosemide (LASIX) 20 MG tablet (Expired)    2. How are you currently taking this medication (dosage and times per day)? As written  3. Are you having a reaction (difficulty breathing--STAT)? no  4. What is your medication issue? Pt called stating that her refill was denied. She wants to know does she still need to take this medication, if so she is requesting a refill.

## 2022-08-04 NOTE — Progress Notes (Signed)
Remote pacemaker transmission.   

## 2022-08-05 ENCOUNTER — Ambulatory Visit: Payer: Self-pay

## 2022-08-05 NOTE — Patient Outreach (Signed)
  Care Coordination   08/05/2022 Name: Adriana Hendricks MRN: [id number] DOB: 1934-12-27   Care Coordination Outreach Attempts:  An unsuccessful telephone outreach was attempted today to offer the patient information about available care coordination services as a benefit of their health plan.   Follow Up Plan:  Additional outreach attempts will be made to offer the patient care coordination information and services.   Encounter Outcome:  No Answer  Care Coordination Interventions Activated:  No   Care Coordination Interventions:  No, not indicated    Daneen Schick, BSW, CDP Social Worker, Certified Dementia Practitioner Care Coordination [phone]

## 2022-08-15 ENCOUNTER — Ambulatory Visit: Payer: Self-pay

## 2022-08-15 NOTE — Patient Instructions (Signed)
Visit Information  Thank you for taking time to visit with me today. Please don't hesitate to contact me if I can be of assistance to you.   Following are the goals we discussed today:   Goals Addressed             This Visit's Progress    COMPLETED: Care Coordination Activities       Care Coordination Interventions: SDoH Screening Performed - no acute resource needs identified Determined the patient does not have concerns regarding medications costs Educated the patient on the role of the care coordination team advising she may request a referral from her primary care provider if ever needed Encouraged the patient to contact her primary care provider as needed         If you are experiencing a Mental Health or [place] or need someone to talk to, please call 1-800-273-TALK (toll free, 24 hour hotline)  Patient verbalizes understanding of instructions and care plan provided today and agrees to view in [place]. Active MyChart status and patient understanding of how to access instructions and care plan via MyChart confirmed with patient.      No further follow up required: Please contact your primary care provider as needed  Daneen Schick, BSW, CDP Social Worker, Certified Dementia Practitioner Care Coordination [phone]

## 2022-08-15 NOTE — Patient Outreach (Signed)
  Care Coordination   Initial Visit Note   08/15/2022 Name: Adriana Hendricks MRN: [id number] DOB: [birth year]  Adriana Hendricks is a 87 y.o. year old female who sees Yong Channel, Brayton Mars, MD for primary care. I spoke with  [name] by phone today  What matters to the patients health and wellness today?  No concerns, doing well at this time    Goals Addressed             This Visit's Progress    COMPLETED: Care Coordination Activities       Care Coordination Interventions: SDoH Screening Performed - no acute resource needs identified Determined the patient does not have concerns regarding medications costs Educated the patient on the role of the care coordination team advising she may request a referral from her primary care provider if ever needed Encouraged the patient to contact her primary care provider as needed        SDOH assessments and interventions completed:  Yes  SDOH Interventions Today    Flowsheet Row Most Recent Value  SDOH Interventions   Food Insecurity Interventions Intervention Not Indicated  Housing Interventions Intervention Not Indicated  Transportation Interventions Intervention Not Indicated        Care Coordination Interventions Activated:  Yes  Care Coordination Interventions:  Yes, provided   Follow up plan: No further intervention required.   Encounter Outcome:  Pt. Visit Completed   Daneen Schick, BSW, CDP Social Worker, Certified Dementia Practitioner Care Coordination [phone]

## 2022-08-18 ENCOUNTER — Ambulatory Visit (INDEPENDENT_AMBULATORY_CARE_PROVIDER_SITE_OTHER): Payer: Medicare Other

## 2022-08-18 DIAGNOSIS — Z7901 Long term (current) use of anticoagulants: Secondary | ICD-10-CM | POA: Diagnosis not present

## 2022-08-18 DIAGNOSIS — I4819 Other persistent atrial fibrillation: Secondary | ICD-10-CM

## 2022-08-18 LAB — POCT INR: INR: 2 (ref 2.0–3.0)

## 2022-08-18 NOTE — Patient Instructions (Signed)
Description   Take 1.5 tablets today and then continue taking 1 tablet daily except 2 tablets each Monday, Wednesday and Friday. Repeat INR in 6 weeks. Coumadin Clinic [phone]

## 2022-09-13 DIAGNOSIS — L814 Other melanin hyperpigmentation: Secondary | ICD-10-CM | POA: Diagnosis not present

## 2022-09-13 DIAGNOSIS — L821 Other seborrheic keratosis: Secondary | ICD-10-CM | POA: Diagnosis not present

## 2022-09-13 DIAGNOSIS — L570 Actinic keratosis: Secondary | ICD-10-CM | POA: Diagnosis not present

## 2022-09-19 ENCOUNTER — Encounter: Payer: Self-pay | Admitting: *Deleted

## 2022-09-29 ENCOUNTER — Ambulatory Visit: Payer: Medicare Other | Attending: Cardiology | Admitting: *Deleted

## 2022-09-29 DIAGNOSIS — I4819 Other persistent atrial fibrillation: Secondary | ICD-10-CM

## 2022-09-29 DIAGNOSIS — Z7901 Long term (current) use of anticoagulants: Secondary | ICD-10-CM

## 2022-09-29 LAB — POCT INR: INR: 3 (ref 2.0–3.0)

## 2022-09-29 NOTE — Patient Instructions (Signed)
Description   Tomorrow take 1 tablet then continue taking 1 tablet daily except 2 tablets each Monday, Wednesday and Friday. Repeat INR in 6 weeks. Coumadin Clinic [phone]

## 2022-09-30 DIAGNOSIS — Z23 Encounter for immunization: Secondary | ICD-10-CM | POA: Diagnosis not present

## 2022-10-03 ENCOUNTER — Ambulatory Visit (INDEPENDENT_AMBULATORY_CARE_PROVIDER_SITE_OTHER): Payer: Medicare Other

## 2022-10-03 DIAGNOSIS — I4819 Other persistent atrial fibrillation: Secondary | ICD-10-CM | POA: Diagnosis not present

## 2022-10-04 LAB — CUP PACEART REMOTE DEVICE CHECK
Date Time Interrogation Session: 20231009145544
Implantable Lead Implant Date: 20230106
Implantable Lead Implant Date: 20230106
Implantable Lead Location: 753858
Implantable Lead Location: 753859
Implantable Lead Model: 377171
Implantable Lead Model: 377171
Implantable Lead Serial Number: 8000603602
Implantable Lead Serial Number: 8000650012
Implantable Pulse Generator Implant Date: 20230106
Pulse Gen Model: 407145
Pulse Gen Serial Number: 70291848

## 2022-10-14 NOTE — Progress Notes (Signed)
Remote pacemaker transmission.   

## 2022-10-19 ENCOUNTER — Ambulatory Visit: Payer: Medicare Other | Attending: Cardiology | Admitting: Cardiology

## 2022-10-19 ENCOUNTER — Encounter: Payer: Self-pay | Admitting: Cardiology

## 2022-10-19 VITALS — BP 127/73 | HR 68 | Ht 65.25 in | Wt 169.6 lb

## 2022-10-19 DIAGNOSIS — I10 Essential (primary) hypertension: Secondary | ICD-10-CM | POA: Diagnosis not present

## 2022-10-19 DIAGNOSIS — I4819 Other persistent atrial fibrillation: Secondary | ICD-10-CM | POA: Diagnosis not present

## 2022-10-19 DIAGNOSIS — I071 Rheumatic tricuspid insufficiency: Secondary | ICD-10-CM | POA: Insufficient documentation

## 2022-10-19 DIAGNOSIS — I442 Atrioventricular block, complete: Secondary | ICD-10-CM | POA: Diagnosis not present

## 2022-10-19 DIAGNOSIS — Z9889 Other specified postprocedural states: Secondary | ICD-10-CM | POA: Insufficient documentation

## 2022-10-19 DIAGNOSIS — Q231 Congenital insufficiency of aortic valve: Secondary | ICD-10-CM | POA: Insufficient documentation

## 2022-10-19 DIAGNOSIS — Z7901 Long term (current) use of anticoagulants: Secondary | ICD-10-CM | POA: Insufficient documentation

## 2022-10-19 DIAGNOSIS — Z8679 Personal history of other diseases of the circulatory system: Secondary | ICD-10-CM | POA: Insufficient documentation

## 2022-10-19 DIAGNOSIS — I051 Rheumatic mitral insufficiency: Secondary | ICD-10-CM | POA: Diagnosis not present

## 2022-10-19 NOTE — Patient Instructions (Signed)

## 2022-10-19 NOTE — Progress Notes (Signed)
Primary Care Provider: Marin Olp, [place] Cardiologist: Glenetta Hew, MD Electrophysiologist: Vickie Epley, MD  Clinic Note: Chief Complaint  Patient presents with   Follow-up    6 months.  Doing well.   Valvular cardiomyopathy    S/p tricuspid valve repair with existing MR and Ao sclerosis with AI.    ===================================  ASSESSMENT/PLAN   Problem List Items Addressed This Visit       Cardiology Problems   Rheumatic tricuspid valve regurgitation - Primary (Chronic)    Status post tricuspid valve repair now with recurrent severe TR.  As long as she is asymptomatic, we will hold off on doing think further.  She is reluctant to do any more surgeries or procedures.      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  No further syncopal episodes. Better energy level with increased rate responsiveness on pacemaker.      Rheumatic mitral regurgitation (Chronic)    Still has stable moderate MR.  Not overly symptomatic.  If it does seem to be worsening, would consider the possibility of referral for TEER with MitraClip (and potentially tricuspid valve clip).  I do not think she would be a good candidate for redo surgery.      Bicuspid aortic valve (Chronic)    Functionally bicuspid with at most mild aortic stenosis on echo.  Will be followed up along with other valves with routine echoes.      Essential hypertension (Chronic)    Blood pressure looks great on current dose of Corgard and Lasix.      Persistent atrial fibrillation (HCC) (Chronic)    Does not seem to have had any breakthrough spells of A-fib, now that she is status post pacemaker, we should we will see if she does.  She had maze with LAA clipping in the setting of her valve surgery.  Remains on standing dose Corgard for rate control.  She is on warfarin for anticoagulation which can be held for procedures or surgeries.  Would not need to  bridge        Other   Long term (current) use of anticoagulants: CHA2DS2Vasc 5, WARFARIN - Rheumatic Vavle Disease (Chronic)    CHA2DS2-VASc or 4 on warfarin for prior A-fib.  However has not had any A-fib since maze surgery.  Also had LAA clipping.   Okay to hold warfarin for surgeries or procedures.  Would not need to bridge.      S/P Maze operation for atrial fibrillation (Chronic)   S/P minimally invasive tricuspid valve repair (Chronic)    As long as she is doing well from a symptomatic standpoint we will continue to manage medically and forego further invasive management with procedures.  She seems to be doing relatively well on meds.  Does not seem to be all that interested in more procedures.  ===================================  HPI:    ATHLEEN FELTNER is a 87 y.o. female with a PMH below who presents today for 77-monthfollow-up. at the request of HMarin Olp MD.  Notable PMH: PERSISTENT ATRIAL FIBRILLATION Remains on warfarin, despite LAA clip and MAZE. No record of recurrence, however sent 3-4 episodes of presumed tachycardia. SEVERE RHEUMATIC TR =>  s/p TVR/MAZE/LAA CLIP 05/2019 Follow-up Echos still show Moderate to Severe TR MODERATE MR FUNCTIONALLY BICUSPID AORTIC VALVE-mild AS COMPLETE HEART BLOCK -  January 2023:  Dual-Chamber PPM (Left Bundle Lead),   BCHATTIE GREESONwas last  seen on April 11 2022-3 months after pacemaker placement by Dr. Quentin Ore.  He had increased heart rate responsiveness for some exertional dyspnea that seemed to make a difference.  No further syncopal episodes.  Happy with heart rate responsiveness.  Noted intermittent headache.  Taking Lasix maybe 2 or 3 times in the last 6 months.  Trying to stay active.  Enjoying the [employer] with all the activity involved.  Mild edema but no PND or orthopnea.  Recent Hospitalizations: N/A  Reviewed  CV studies:    The following studies were reviewed today: (if available, images/films  reviewed: From Epic Chart or Care Everywhere) Echo 03/2022:EF 55 to 60%.  No RWMA.  Mild LVH.  Indeterminate filling pressures.  Mild LA dilation.  Mildly elevated PAP with Moderate to severe TR, and moderate RA dilation -> normal RAP.  Aortic valve appears to be functionally bicuspid with fused left and right cusps with moderate calcification.  Mild AS.  Aortic root dilated to 42 mm.  Comparison(s): 09/08/21 EF 60-65%. Moderate MR. Moderate-severe TR.  Interval History:   NAVIKA HOOPES returns here today for 77-monthfollow-up overall doing pretty well.  She is still has swelling in the legs with varicose veins and wears a support stockings.  They are pretty well controlled.  Not really want to use her extra doses of Lasix.  She says may be 2 or 3 times a month, but had not used 1 in the last 3 months.  Not requiring any as needed metoprolol dosing, just using Corgard. She says she still gets a little bit winded when she is active, but she just keeps on going.  No chest pain or pressure.  She gets worn out and then stops and rests and then keeps ongoing.  She says that she is eating better and feels healthier.  Feels stronger.  CV Review of Symptoms (Summary): positive for - dyspnea on exertion, edema, and these are both pretty stable; exercise intolerance improved with rate control on PPM increase. negative for - chest pain, irregular heartbeat, orthopnea, palpitations, paroxysmal nocturnal dyspnea, rapid heart rate, shortness of breath, or lightheadedness or dizziness, syncope/near syncope or TIA/amaurosis fugax.  Claudication.  REVIEWED OF SYSTEMS   Review of Systems  Constitutional:  Negative for malaise/fatigue (Doing better) and weight loss.  HENT:  Negative for congestion.   Respiratory:  Positive for shortness of breath (When she gets very active or overdoes it.). Negative for cough.   Cardiovascular:  Positive for leg swelling.  Gastrointestinal:  Negative for blood in stool and melena.   Genitourinary:  Positive for frequency. Negative for dysuria and hematuria.  Musculoskeletal:  Positive for joint pain. Negative for falls.  Neurological:  Positive for dizziness (Sometimes gets vertigo). Negative for focal weakness and headaches.  Psychiatric/Behavioral:  Positive for memory loss (Mild). Negative for depression. The patient is not nervous/anxious and does not have insomnia.   We will I have reviewed and (if needed) personally updated the patient's problem list, medications, allergies, past medical and surgical history, social and family history.   PAST MEDICAL HISTORY   Past Medical History:  Diagnosis Date   Achilles tendon rupture    Arthritis of hand, right    Atrial flutter with rapid ventricular response ([place]    Carpal tunnel syndrome 09/04/2013   Collagenous colitis 2005   Colon polyps 2010   Tubular adenoma and hyperplastic   Dental infection 10/25/2014   Diverticulosis    Esophageal stenosis    Essential tremor  GERD (gastroesophageal reflux disease)    hx hiatal hernia, hx esophagitis, hx stricture   Hiatal hernia    Hypertension    Lower extremity neuropathy 08/23/2013   On B12 therapy with numbness in the feet bilaterally no evidence of diabetes    Ocular migraine    jagged vision, a few per month   OSA (obstructive sleep apnea) 12/21/2016   on CPAP   Peripheral neuropathy    treated by Dr Posey Pronto (07/2015)   Persistent atrial fibrillation ([place])    Rate control with beta-blocker and anticoagulated with warfarin   Pleural effusion, left    S/P Maze operation for atrial fibrillation 06/12/2019   Complete bilateral atrial lesion set using cryothermy and bipolar radiofrequency ablation with clipping of LA appendage via right mini thoracotomy approach   S/P minimally invasive tricuspid valve repair 06/12/2019   Complex valvuloplasty including autologous pericardial patch augmentation of anterior and posterior leaflets with 28 mm Edwards mc3 ring  annuloplasty via right mini thoracotomy approach   Severe tricuspid regurgitation 07/2018   Noted Aug 2019 during a fib workup. Severe LAE as well   Sinus node dysfunction (HCC)     PAST SURGICAL HISTORY   Past Surgical History:  Procedure Laterality Date   48 hr Holter Monitor  07/2018   Persistent Afib (rate 33 - 124 bpm)   APPENDECTOMY     CARDIAC CATHETERIZATION     CARDIOVERSION N/A 10/02/2018   Procedure: CARDIOVERSION;  Surgeon: Acie Fredrickson Wonda Cheng, MD;  Location: [facility];  Service: Cardiovascular;  Laterality: N/A;   CARPAL TUNNEL RELEASE     CATARACT EXTRACTION     EXCISION MORTON'S NEUROMA     Right foot   EYE SURGERY     Cataracts with implants-bilateral   INTRAOCULAR LENS IMPLANT, SECONDARY     IR THORACENTESIS ASP PLEURAL SPACE W/IMG GUIDE  07/10/2019   IR THORACENTESIS ASP PLEURAL SPACE W/IMG GUIDE  07/10/2019   IR THORACENTESIS ASP PLEURAL SPACE W/IMG GUIDE  07/26/2019   MINIMALLY INVASIVE MAZE PROCEDURE N/A 06/12/2019   Procedure: MINIMALLY INVASIVE MAZE PROCEDURE;  Surgeon: Rexene Alberts, MD;  Location: [facility];  Service: Open Heart Surgery;  Laterality: N/A;   MINIMALLY INVASIVE TRICUSPID VALVE REPAIR Right 06/12/2019   Procedure: MINIMALLY INVASIVE TRICUSPID VALVE REPAIR USING EDWARDS MC3 T28 ANNULOPLASTY RING, INSERTION TEMPORARY TRANSVENOUS AV PACING LEAD;  Surgeon: Rexene Alberts, MD;  Location: [facility];  Service: Open Heart Surgery;  Laterality: Right;   Moles removed     MOUTH SURGERY     (For Exostosis)   NUCLEAR  07/2018   EF 71 %. LOW RISK - no ischemia or Infarct.    PACEMAKER IMPLANT N/A 12/31/2021   Procedure: PACEMAKER IMPLANT;  Surgeon: Vickie Epley, MD;  Location: [facility];  Service: Cardiovascular;  Laterality: N/A;   RIGHT/LEFT HEART CATH AND CORONARY ANGIOGRAPHY N/A 01/16/2019   Procedure: RIGHT/LEFT HEART CATH AND CORONARY ANGIOGRAPHY;  Surgeon: Leonie Man, MD;  Location: [facility];; Angiographically normal  coronary arteries.  Normal LVEDP. Upper Limit of Normal PAP~23 mmHg & PCWP 18 mmHg.  LVEDP of 7 mmHg. -With mean PAP of 23 mmHg there is a TPG suggesting a primary pulmonary etiology.   ROTATOR CUFF REPAIR     TEE WITHOUT CARDIOVERSION N/A 01/16/2019   Procedure: TRANSESOPHAGEAL ECHOCARDIOGRAM (TEE);  Surgeon: Sueanne Margarita, MD;  Location: [facility];;  Severe TR due to poor coaptation of the leaflets from annular dilation.  Mild to  moderate mitral vegetation with mildly restricted mobility of the posterior leaflet.  EF 55-60% with no R WMA.  Severe RA and LA dilation.   TEE WITHOUT CARDIOVERSION N/A 06/12/2019   Procedure: TRANSESOPHAGEAL ECHOCARDIOGRAM (TEE);  Surgeon: Rexene Alberts, MD;  Location: [facility];  Service: Open Heart Surgery;  Laterality: N/A;   TONSILLECTOMY     TRANSTHORACIC ECHOCARDIOGRAM  08/05/2019   First postop AVR: EF 60 -65%. Mod Conc LVH. Gr III DD (? w/ Mild LA dilation).  Rheumatic MV w/ mod thickening & Ca2+. Mild doming of AML - NO MVP.  Mild-mod MR & MS (mean MVA gradient 7 mmHg).  ~ functional bicuspid AoV w/ Mod Sclerosis - no AS.  mild TA dilation;; b) 2 y/r Post-Op: EF 60-65%.  No RWMA. ?D Fxn w/ mild LA dil. High PAP ~64.5 mmHg.  Mild-Mod RA dil. Mod MR; Severe TR (repaired)   TRANSTHORACIC ECHOCARDIOGRAM  03/30/2022   EF 55 to 60%.  No RWMA.  Mild LVH.  Indeterminate filling pressures.  Mild LA dilation.  Mildly elevated PAP with Moderate to severe TR, and moderate RA dilation -> normal RAP.  Aortic valve appears to be functionally bicuspid with fused left and right cusps with moderate calcification.  Mild AS.  Aortic root dilated to 42 mm.; Comparison(s): 09/08/21 EF 60-65%. Moderate MR. Moderate-severe TR.    Immunization History  Administered Date(s) Administered   Fluad Quad(high Dose 65+) 10/08/2019, 10/12/2020, 10/13/2021   Influenza Split 12/01/2011, 11/05/2012   Influenza Whole 01/07/2010, 10/08/2010   Influenza, High Dose Seasonal PF 10/01/2014,  11/10/2015, 10/25/2016, 10/30/2017, 10/22/2018   Influenza,inj,Quad PF,6+ Mos 08/23/2013   PFIZER(Purple Top)SARS-COV-2 Vaccination 01/04/2020, 01/25/2020, 09/22/2020, 04/08/2021   Pfizer Covid-19 Vaccine Bivalent Booster 70yr & up 09/09/2021   Pneumococcal Conjugate-13 04/13/2015   Pneumococcal Polysaccharide-23 12/26/2005   Td 12/26/2001   Tdap 01/23/2012   Zoster Recombinat (Shingrix) 12/02/2020, 05/21/2021   Zoster, Live 02/08/2010    MEDICATIONS/ALLERGIES   Current Meds  Medication Sig   B Complex Vitamins (VITAMIN B COMPLEX) TABS Take 1 tablet by mouth daily.   calcium carbonate (TUMS - DOSED IN MG ELEMENTAL CALCIUM) 500 MG chewable tablet Chew 1 tablet by mouth daily.    cholecalciferol (VITAMIN D3) 25 MCG (1000 UT) tablet Take 1,000 Units by mouth daily.   famotidine (PEPCID) 20 MG tablet Take 20 mg by mouth at bedtime.   furosemide (LASIX) 20 MG tablet Take 1 tablet (20 mg total) by mouth daily. May take an additional 20 mg if needed for swelling or shortness of breath   metoprolol tartrate (LOPRESSOR) 25 MG tablet Take 1 tablet (25 mg total) by mouth as needed.   Multiple Vitamins-Minerals (PRESERVISION AREDS 2 PO) Take 1 capsule by mouth in the morning and at bedtime.   nadolol (CORGARD) 20 MG tablet TAKE 1 TABLET BY MOUTH ONCE DAILY   potassium chloride 20 MEQ/15ML (10%) SOLN TAKE 15 ML BY MOUTH ONCE DAILY *DILUTE WITH 4-8 OUNCES OF LIQUID* *TAKE WITH FOOD*   TURMERIC PO Take 500 mg by mouth daily.   warfarin (COUMADIN) 2.5 MG tablet TAKE 1 TO 2 TABLETS BY MOUTH ONCE DAILY OR AS INSTRUCTED BY COUMADIN CLINIC   [DISCONTINUED] vitamin B-12 (CYANOCOBALAMIN) 250 MCG tablet Take 250 mcg by mouth daily.    Allergies  Allergen Reactions   Levofloxacin Other (See Comments)    Developed tendon pain. (Has a history of a Achilles tendon tear)   Potassium Chloride Other (See Comments)    Can't take tables  Clarithromycin Nausea Only   Erythromycin Base Nausea And Vomiting    Metronidazole Rash   Penicillins Rash and Other (See Comments)    Did it involve swelling of the face/tongue/throat, SOB, or low BP? No Did it involve sudden or severe rash/hives, skin peeling, or any reaction on the inside of your mouth or nose? No Did you need to seek medical attention at a hospital or doctor's office? No When did it last happen?  as a child If all above answers are "NO", may proceed with cephalosporin use.     SOCIAL HISTORY/FAMILY HISTORY   Reviewed in Epic:  Pertinent findings:  Social History   Tobacco Use   Smoking status: Former    Packs/day: 2.00    Years: 13.00    Total pack years: 26.00    Types: Cigarettes    Start date: 12/26/1954    Quit date: 03/26/1968    Years since quitting: 54.6   Smokeless tobacco: Never   Tobacco comments:    Quit in 1969 - smokeed 2-3PPD , was 3 PPD at her heaviest for about 3 years.   Vaping Use   Vaping Use: Never used  Substance Use Topics   Alcohol use: Yes    Alcohol/week: 2.0 standard drinks of alcohol    Types: 2 Glasses of wine per week    Comment: 2 glass of wine per day   Drug use: No   Social History   Social History Narrative   Married. Husband is patient of Dr. Thurnell Garbe   Married 1958. 2 children. 4 grandkids (3 girls, 1 boy).    Retired from [employer].   Hobbies: genealogy and DNA   Right handed    One story      Recently moved to a retirement community in [place] (Spring 2022)-close on their prior house in July 2022..  Taking advantage of the fact that she does not have to cook every day.  Is also taking advantage of the multiple daily exercise classes.  She tries to do classes at least 3 days a week if not more.  She is also doing much more walking around the community.    OBJCTIVE -PE, EKG, labs   Wt Readings from Last 3 Encounters:  10/19/22 169 lb 9.6 oz (76.9 kg)  07/11/22 163 lb 12.8 oz (74.3 kg)  05/02/22 170 lb 9.6 oz (77.4 kg)    Physical Exam: BP  127/73   Pulse 68   Ht 5' 5.25" (1.657 m)   Wt 169 lb 9.6 oz (76.9 kg)   LMP  (LMP Unknown)   SpO2 99%   BMI 28.01 kg/m  Physical Exam Vitals reviewed.  Constitutional:      General: She is not in acute distress.    Appearance: Normal appearance. She is normal weight. She is not ill-appearing or toxic-appearing.     Comments: Healthy-appearing.  Well-nourished and well-groomed.  HENT:     Head: Normocephalic and atraumatic.  Neck:     Vascular: No carotid bruit.  Cardiovascular:     Rate and Rhythm: Normal rate and regular rhythm.     Chest Wall: PMI is not displaced.     Pulses: Normal pulses.     Heart sounds: S1 normal and S2 normal. Heart sounds are distant. Murmur heard.     Harsh crescendo-decrescendo early systolic murmur is present with a grade of 1/6 at the upper right sternal border radiating to the neck.     High-pitched  blowing holosystolic murmur is also present at the upper left sternal border, lower right sternal border and apex radiating to the back and apex. Likely 2 murmurs 1 from MR and the other from TR     No friction rub. No gallop.  Pulmonary:     Effort: Pulmonary effort is normal. No respiratory distress.     Breath sounds: Normal breath sounds. No wheezing, rhonchi or rales.  Chest:     Chest wall: No tenderness.  Musculoskeletal:        General: Swelling (Trivial) present. Normal range of motion.     Cervical back: Normal range of motion and neck supple.  Skin:    General: Skin is warm and dry.  Neurological:     General: No focal deficit present.     Mental Status: She is alert and oriented to person, place, and time.  Psychiatric:        Mood and Affect: Mood normal.        Behavior: Behavior normal.        Thought Content: Thought content normal.        Judgment: Judgment normal.      Adult ECG Report None  Recent Labs: Reviewed Lab Results  Component Value Date   CHOL 224 (H) 09/07/2021   HDL 57 09/07/2021   LDLCALC 134 (H)  09/07/2021   LDLDIRECT 128.8 01/17/2011   TRIG 189 (H) 09/07/2021   CHOLHDL 3.9 09/07/2021   Lab Results  Component Value Date   CREATININE 1.05 05/02/2022   BUN 24 (H) 05/02/2022   NA 138 05/02/2022   K 4.2 05/02/2022   CL 99 05/02/2022   CO2 30 05/02/2022      Latest Ref Rng & Units 05/02/2022    4:00 PM 12/29/2021    2:42 PM 09/07/2021    8:27 AM  CBC  WBC 4.0 - 10.5 K/uL 9.1  9.0  6.1   Hemoglobin 12.0 - 15.0 g/dL 12.0  13.9  14.0   Hematocrit 36.0 - 46.0 % 35.9  40.8  39.7   Platelets 150.0 - 400.0 K/uL 186.0  191  174     Lab Results  Component Value Date   HGBA1C 5.8 05/02/2022   Lab Results  Component Value Date   TSH 3.280 09/07/2021    ================================================== I spent a total of 19 minutes with the patient spent in direct patient consultation.  Additional time spent with chart review  / charting (studies, outside notes, etc): 18 min Total Time: 37 min  Current medicines are reviewed at length with the patient today.  (+/- concerns) none  Notice: This dictation was prepared with Dragon dictation along with smart phrase technology. Any transcriptional errors that result from this process are unintentional and may not be corrected upon review.  Studies Ordered:   No orders of the defined types were placed in this encounter.  No orders of the defined types were placed in this encounter.   Patient Instructions / Medication Changes & Studies & Tests Ordered   Patient Instructions  Medication Instructions:   No changes   *If you need a refill on your cardiac medications before your next appointment, please call your pharmacy*   Lab Work:   Not needed   Testing/Procedures:  Not needed  Follow-Up: At [facility], you and your health needs are our priority.  As part of our continuing mission to provide you with exceptional heart care, we have created designated Provider Care Teams.  These Care Teams  include your primary  Cardiologist (physician) and Advanced Practice Providers (APPs -  Physician Assistants and Nurse Practitioners) who all work together to provide you with the care you need, when you need it.  We recommend signing up for the patient portal called "MyChart".  Sign up information is provided on this After Visit Summary.  MyChart is used to connect with patients for Virtual Visits (Telemedicine).  Patients are able to view lab/test results, encounter notes, upcoming appointments, etc.  Non-urgent messages can be sent to your provider as well.   To learn more about what you can do with MyChart, go to [email or web address].    Your next appointment:   12 month(s)  The format for your next appointment:   In Person  Provider:   Glenetta Hew, MD      Leonie Man, MD, MS Glenetta Hew, M.D., M.S. Interventional Cardiologist  [place]  Pager # [phone] Phone # [phone] [address] [place], [place] [address]   Thank you for choosing [facility]!!

## 2022-10-30 ENCOUNTER — Encounter: Payer: Self-pay | Admitting: Cardiology

## 2022-10-30 NOTE — Assessment & Plan Note (Addendum)
Functionally bicuspid with at most mild aortic stenosis on echo.  Will be followed up along with other valves with routine echoes.

## 2022-10-30 NOTE — Assessment & Plan Note (Signed)
CHA2DS2-VASc or 4 on warfarin for prior A-fib.  However has not had any A-fib since maze surgery.  Also had LAA clipping.   Okay to hold warfarin for surgeries or procedures.  Would not need to bridge.

## 2022-10-30 NOTE — Assessment & Plan Note (Signed)
Doing well post PPM.  No further syncopal episodes. Better energy level with increased rate responsiveness on pacemaker.

## 2022-10-30 NOTE — Assessment & Plan Note (Signed)
Still has stable moderate MR.  Not overly symptomatic.  If it does seem to be worsening, would consider the possibility of referral for TEER with MitraClip (and potentially tricuspid valve clip).  I do not think she would be a good candidate for redo surgery.

## 2022-10-30 NOTE — Assessment & Plan Note (Signed)
Status post tricuspid valve repair now with recurrent severe TR.  As long as she is asymptomatic, we will hold off on doing think further.  She is reluctant to do any more surgeries or procedures.

## 2022-10-30 NOTE — Assessment & Plan Note (Signed)
Blood pressure looks great on current dose of Corgard and Lasix.

## 2022-10-30 NOTE — Assessment & Plan Note (Signed)
Does not seem to have had any breakthrough spells of A-fib, now that she is status post pacemaker, we should we will see if she does.  She had maze with LAA clipping in the setting of her valve surgery.  Remains on standing dose Corgard for rate control.  She is on warfarin for anticoagulation which can be held for procedures or surgeries.  Would not need to bridge

## 2022-11-03 ENCOUNTER — Encounter: Payer: Self-pay | Admitting: Family Medicine

## 2022-11-03 ENCOUNTER — Ambulatory Visit (INDEPENDENT_AMBULATORY_CARE_PROVIDER_SITE_OTHER): Payer: Medicare Other | Admitting: Family Medicine

## 2022-11-03 VITALS — BP 108/60 | HR 75 | Temp 97.7°F | Ht 65.0 in | Wt 169.8 lb

## 2022-11-03 DIAGNOSIS — R739 Hyperglycemia, unspecified: Secondary | ICD-10-CM

## 2022-11-03 DIAGNOSIS — I10 Essential (primary) hypertension: Secondary | ICD-10-CM

## 2022-11-03 DIAGNOSIS — H539 Unspecified visual disturbance: Secondary | ICD-10-CM | POA: Diagnosis not present

## 2022-11-03 DIAGNOSIS — G250 Essential tremor: Secondary | ICD-10-CM

## 2022-11-03 DIAGNOSIS — E785 Hyperlipidemia, unspecified: Secondary | ICD-10-CM | POA: Diagnosis not present

## 2022-11-03 DIAGNOSIS — G43109 Migraine with aura, not intractable, without status migrainosus: Secondary | ICD-10-CM | POA: Diagnosis not present

## 2022-11-03 MED ORDER — RSVPREF3 VAC RECOMB ADJUVANTED 120 MCG/0.5ML IM SUSR: 0.5000 mL | Freq: Once | INTRAMUSCULAR | 0 refills | Status: AC

## 2022-11-03 NOTE — Patient Instructions (Addendum)
Health Maintenance Due  Topic Date Due   Medicare Annual Wellness (AWV)  03/09/2018  You are eligible to schedule your annual wellness visit with our nurse specialist Otila Kluver.  Please consider scheduling this before you leave today  Tdap at pharmacy and let us know  Printed RSV vaccination  We will call you within two weeks about your referral to neurology. If you do not hear within 2 weeks, give [place] a call.   We will call you within two weeks about your referral to MRI through [facility].  Their phone number is [phone].  Please call them if you have not heard in 1-2 weeks - if further worsening please seek care  Schedule a lab visit at the check out desk within 2 weeks. Return for future fasting labs meaning nothing but water after midnight please. Ok to take your medications with water.   Move up eye appointment if you can  Recommended follow up: Return in about 6 months (around 05/04/2023) for followup or sooner if needed.Schedule b4 you leave.

## 2022-11-03 NOTE — Progress Notes (Signed)
Phone [phone] In person visit   Subjective:   Adriana Hendricks is a 87 y.o. year old very pleasant female patient who presents for/with See problem oriented charting Chief Complaint  Patient presents with   Follow-up    Wants Rx for RSV shot.   Hypertension   Past Medical History-  Patient Active Problem List   Diagnosis Date Noted   Sinus node dysfunction ([place])     Priority: High   S/P minimally invasive tricuspid valve repair 06/12/2019    Priority: High   S/P Maze operation for atrial fibrillation 06/12/2019    Priority: High   Rheumatic tricuspid valve regurgitation     Priority: High   Persistent atrial fibrillation ([place])     Priority: High   Osteopenia 03/13/2017    Priority: High   Hyperglycemia 10/26/2021    Priority: Medium    Bicuspid aortic valve 01/28/2019    Priority: Medium    DOE (dyspnea on exertion) 07/26/2018    Priority: Medium    Ocular migraine 03/13/2017    Priority: Medium    OSA (obstructive sleep apnea) 12/21/2016    Priority: Medium    Lower extremity neuropathy 08/23/2013    Priority: Medium    Hyperlipidemia with target LDL less than 100 01/07/2010    Priority: Medium    GERD with stricture 06/19/2009    Priority: Medium    Essential tremor 11/16/2007    Priority: Medium    Essential hypertension 11/16/2007    Priority: Medium    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    Priority: Low   Tricuspid valve insufficiency     Priority: Low   Long term (current) use of anticoagulants: CHA2DS2Vasc 5, WARFARIN - Rheumatic Vavle Disease 08/28/2018    Priority: Low   IBS (irritable bowel syndrome) 09/12/2016    Priority: Low   History of adenomatous polyp of colon 09/12/2016    Priority: Low   Former smoker 09/12/2016    Priority: Low   Carpal tunnel syndrome 09/04/2013    Priority: Low   Achilles tendon tear 01/17/2011    Priority: Low   ACNE ROSACEA 04/03/2008    Priority: Low   Heart block AV complete ([place]) - s/p PPM  11/29/2021   Varicose veins of lower extremity with edema 06/25/2021   Palpitations 11/10/2019   Pleural effusion, left     Medications- reviewed and updated Current Outpatient Medications  Medication Sig Dispense Refill   B Complex Vitamins (VITAMIN B COMPLEX) TABS Take 1 tablet by mouth daily.     calcium carbonate (TUMS - DOSED IN MG ELEMENTAL CALCIUM) 500 MG chewable tablet Chew 1 tablet by mouth daily.      cholecalciferol (VITAMIN D3) 25 MCG (1000 UT) tablet Take 1,000 Units by mouth daily.     famotidine (PEPCID) 20 MG tablet Take 20 mg by mouth at bedtime.     furosemide (LASIX) 20 MG tablet Take 1 tablet (20 mg total) by mouth daily. May take an additional 20 mg if needed for swelling or shortness of breath 110 tablet 3   metoprolol tartrate (LOPRESSOR) 25 MG tablet Take 1 tablet (25 mg total) by mouth as needed. 20 tablet 6   Multiple Vitamins-Minerals (PRESERVISION AREDS 2 PO) Take 1 capsule by mouth in the morning and at bedtime.     nadolol (CORGARD) 20 MG tablet TAKE 1 TABLET BY MOUTH ONCE DAILY 90 tablet 2   potassium chloride 20 MEQ/15ML (10%) SOLN TAKE 15 ML BY MOUTH ONCE DAILY *DILUTE  WITH 4-8 OUNCES OF LIQUID* *TAKE WITH FOOD* 15 mL 3   RSV vaccine recomb adjuvanted (AREXVY) 120 MCG/0.5ML injection Inject 0.5 mLs into the muscle once for 1 dose. 0.5 mL 0   TURMERIC PO Take 500 mg by mouth daily.     warfarin (COUMADIN) 2.5 MG tablet TAKE 1 TO 2 TABLETS BY MOUTH ONCE DAILY OR AS INSTRUCTED BY COUMADIN CLINIC 120 tablet 3   No current facility-administered medications for this visit.     Objective:  BP 108/60   Pulse 75   Temp 97.7 F (36.5 C)   Ht '5\' 5"'$  (1.651 m)   Wt 169 lb 12.8 oz (77 kg)   LMP  (LMP Unknown)   SpO2 98%   BMI 28.26 kg/m  Gen: NAD, resting comfortably CV: RRR Lungs: CTAB no crackles, wheeze, rhonchi Abdomen: soft/nontender/nondistended/normal bowel sounds.  Ext: trace edema Skin: warm, dry Neuro: CN II-XII intact, sensation and reflexes  normal throughout, 5/5 muscle strength in bilateral upper and lower extremities. Normal finger to nose. Normal rapid alternating movements. No pronator drift. Normal romberg. Normal gait.      Assessment and Plan    #Ocular migraine- has increased in recent months to 4-5 days a week and in the past was very sparing- saw Dr. Posey Pronto in the past. Loses center vision field bilateral then slowly jagged lines on right or left of this then gradually works out of field of vision. Episodes last 30-40 minutes. Does not change if closes either eye. ROS- No facial or extremity weakness. No slurred words or trouble swallowing. no double vision. No paresthesias. No confusion or word finding difficulties.  -with increasing frequency get mri brain- was told pacemaker mri compatible  -refer back to neurology  # Atrial fibrillation-follows with Dr. Allison Quarry: Rate controlled with nadolol 20 mg daily.  Has metoprolol available if needed Anticoagulated with Coumadin -a fib maze/tricuspid valve replacement late June 12, 2019 hospitalization- no recent issues A/P: no recurrent issues- continue to monitor- stay on nadolol in case goes into a fib . Appropriate anticoagulation with coumaind   #Complete heart block/sinus node dysfunction S: Permanent pacemaker implant December 31, 2021 -Patient prior to this had multiple syncopal episodes arrhythmic in origin A/P: doing well with this- just had follow up with cardiology   % Status post minimally invasive tricuspid valve repair (related to rheumatism)-still with moderate to severe tricuspid regurgitation on 03/30/2022 echocardiogram.  Also with thickening of aortic valve without significant narrowing/stenosis- continue follow up Dr. [name]   #hypertension and essential tremor S: medication: Lasix 20 mg, nadolol 20 mg -some worsening of tremor but tolerable BP Readings from Last 3 Encounters:  11/03/22 108/60  10/19/22 127/73  07/11/22 120/70   A/P: blood pressure  Controlled. Continue current medications.  Tremor slightly worse but does not want to change meds at this time   #hyperlipidemia-over age 34 does not want to add medicine for primary prevention S: Medication:none  Lab Results  Component Value Date   CHOL 224 (H) 09/07/2021   HDL 57 09/07/2021   LDLCALC 134 (H) 09/07/2021   LDLDIRECT 128.8 01/17/2011   TRIG 189 (H) 09/07/2021   CHOLHDL 3.9 09/07/2021   A/P: update lipids- work on healthy lifestyle instead of starting statin at her age.    # Hyperglycemia/insulin resistance/prediabetes-A1c 5.01 September 2021 S:  Medication: none Lab Results  Component Value Date   HGBA1C 5.8 05/02/2022   HGBA1C 5.7 (H) 09/07/2021   HGBA1C 5.4 06/10/2019  A/P: hopefully stable- update  a1c today. Continue  without  meds for now  Recommended follow up: Return in about 6 months (around 05/04/2023) for followup or sooner if needed.Schedule b4 you leave. Future Appointments  Date Time Provider [place]  11/10/2022  2:15 PM [facility] None  01/02/2023  7:05 AM [facility] DEVICE REMOTES [facility]  04/03/2023  7:05 AM [facility] DEVICE REMOTES [facility]  07/03/2023  7:05 AM [facility] DEVICE REMOTES [facility]   Lab/Order associations:   ICD-10-CM   1. Hyperlipidemia with target LDL less than 100  E78.5 CBC with Differential/Platelet    Comprehensive metabolic panel    Lipid panel    2. Essential hypertension  I10     3. Essential tremor  G25.0     4. Hyperglycemia  R73.9 HgB A1c    5. Ocular migraine  G43.109 MR Brain Wo Contrast    Ambulatory referral to Neurology     Meds ordered this encounter  Medications   RSV vaccine recomb adjuvanted (AREXVY) 120 MCG/0.5ML injection    Sig: Inject 0.5 mLs into the muscle once for 1 dose.    Dispense:  0.5 mL    Refill:  0   Return precautions advised.  Garret Reddish, MD

## 2022-11-04 ENCOUNTER — Ambulatory Visit (INDEPENDENT_AMBULATORY_CARE_PROVIDER_SITE_OTHER): Payer: Medicare Other | Admitting: Neurology

## 2022-11-04 ENCOUNTER — Encounter: Payer: Self-pay | Admitting: Neurology

## 2022-11-04 ENCOUNTER — Other Ambulatory Visit: Payer: Medicare Other

## 2022-11-04 VITALS — BP 123/69 | HR 68 | Ht 65.0 in | Wt 169.0 lb

## 2022-11-04 DIAGNOSIS — G43109 Migraine with aura, not intractable, without status migrainosus: Secondary | ICD-10-CM | POA: Diagnosis not present

## 2022-11-04 LAB — COMPREHENSIVE METABOLIC PANEL
ALT: 20 U/L (ref 0–35)
AST: 24 U/L (ref 0–37)
Albumin: 4.4 g/dL (ref 3.5–5.2)
Alkaline Phosphatase: 84 U/L (ref 39–117)
BUN: 23 mg/dL (ref 6–23)
CO2: 30 mEq/L (ref 19–32)
Calcium: 9.4 mg/dL (ref 8.4–10.5)
Chloride: 100 mEq/L (ref 96–112)
Creatinine, Ser: 0.87 mg/dL (ref 0.40–1.20)
GFR: 60.8 mL/min (ref >60.00)
Glucose, Bld: 94 mg/dL (ref 70–99)
Potassium: 4.7 mEq/L (ref 3.5–5.1)
Sodium: 136 mEq/L (ref 135–145)
Total Bilirubin: 0.8 mg/dL (ref 0.2–1.2)
Total Protein: 7.3 g/dL (ref 6.0–8.3)

## 2022-11-04 LAB — CBC WITH DIFFERENTIAL/PLATELET
Basophils Absolute: 0 10*3/uL (ref 0.0–0.1)
Basophils Relative: 0.6 % (ref 0.0–3.0)
Eosinophils Absolute: 0.1 10*3/uL (ref 0.0–0.7)
Eosinophils Relative: 1.4 % (ref 0.0–5.0)
HCT: 37.6 % (ref 36.0–46.0)
Hemoglobin: 12.7 g/dL (ref 12.0–15.0)
Lymphocytes Relative: 16.5 % (ref 12.0–46.0)
Lymphs Abs: 0.9 10*3/uL (ref 0.7–4.0)
MCHC: 33.7 g/dL (ref 30.0–36.0)
MCV: 93.7 fl (ref 78.0–100.0)
Monocytes Absolute: 0.5 10*3/uL (ref 0.1–1.0)
Monocytes Relative: 9.7 % (ref 3.0–12.0)
Neutro Abs: 4 10*3/uL (ref 1.4–7.7)
Neutrophils Relative %: 71.8 % (ref 43.0–77.0)
Platelets: 168 10*3/uL (ref 150.0–400.0)
RBC: 4.02 Mil/uL (ref 3.87–5.11)
RDW: 14 % (ref 11.5–15.5)
WBC: 5.5 10*3/uL (ref 4.0–10.5)

## 2022-11-04 LAB — LIPID PANEL
Cholesterol: 195 mg/dL (ref 0–200)
HDL: 61 mg/dL (ref >39.00)
LDL Cholesterol: 112 mg/dL — ABNORMAL HIGH (ref 0–99)
NonHDL: 134.45
Total CHOL/HDL Ratio: 3
Triglycerides: 114 mg/dL (ref 0.0–149.0)
VLDL: 22.8 mg/dL (ref 0.0–40.0)

## 2022-11-04 LAB — HEMOGLOBIN A1C: Hgb A1c MFr Bld: 6 % (ref 4.6–6.5)

## 2022-11-04 NOTE — Addendum Note (Signed)
Addended by: Marin Olp on: 11/04/2022 04:51 PM   Modules accepted: Orders

## 2022-11-04 NOTE — Progress Notes (Signed)
Follow-up Visit   Date: 11/04/22   Adriana Hendricks MRN: [id number] DOB: [birth year]   Interim History: Adriana Hendricks is a 87 y.o. right-handed Caucasian female with history of GERD, hypertension, s/p MAZE procedure and minimally invasive tricuspid valve repair (2020) and left CTS s/p release (2014) here for acute visit for ocular migraines. She was previously for neuropathy, hand tremor, and altered awareness.  The patient was accompanied by self.  Over the past several months, she has been having frequent ocular migraines occurring 3-4 times per week, sometimes even daily.  Previously, she would have it 2-3 times per month.  She has been unable to determine what has triggered these to occur more.  They are described as loss of central vision in the eye followed by jagged appearance, which will eventually move out of her visual field.  This usually lasts 30-45 min.  She reports having increased stress over the past few weeks due to IRS not receiving her tax return and is trying to navigate this process.     Medications:  Current Outpatient Medications on File Prior to Visit  Medication Sig Dispense Refill   B Complex Vitamins (VITAMIN B COMPLEX) TABS Take 1 tablet by mouth daily.     calcium carbonate (TUMS - DOSED IN MG ELEMENTAL CALCIUM) 500 MG chewable tablet Chew 1 tablet by mouth daily.      cholecalciferol (VITAMIN D3) 25 MCG (1000 UT) tablet Take 1,000 Units by mouth daily.     famotidine (PEPCID) 20 MG tablet Take 20 mg by mouth at bedtime.     furosemide (LASIX) 20 MG tablet Take 1 tablet (20 mg total) by mouth daily. May take an additional 20 mg if needed for swelling or shortness of breath 110 tablet 3   metoprolol tartrate (LOPRESSOR) 25 MG tablet Take 1 tablet (25 mg total) by mouth as needed. 20 tablet 6   Multiple Vitamins-Minerals (PRESERVISION AREDS 2 PO) Take 1 capsule by mouth in the morning and at bedtime.     nadolol (CORGARD) 20 MG tablet TAKE 1 TABLET BY MOUTH  ONCE DAILY 90 tablet 2   potassium chloride 20 MEQ/15ML (10%) SOLN TAKE 15 ML BY MOUTH ONCE DAILY *DILUTE WITH 4-8 OUNCES OF LIQUID* *TAKE WITH FOOD* 15 mL 3   TURMERIC PO Take 500 mg by mouth daily.     warfarin (COUMADIN) 2.5 MG tablet TAKE 1 TO 2 TABLETS BY MOUTH ONCE DAILY OR AS INSTRUCTED BY COUMADIN CLINIC 120 tablet 3   No current facility-administered medications on file prior to visit.    Allergies:  Allergies  Allergen Reactions   Levofloxacin Other (See Comments)    Developed tendon pain. (Has a history of a Achilles tendon tear)   Potassium Chloride Other (See Comments)    Can't take tables   Clarithromycin Nausea Only   Erythromycin Base Nausea And Vomiting   Metronidazole Rash   Penicillins Rash and Other (See Comments)    Did it involve swelling of the face/tongue/throat, SOB, or low BP? No Did it involve sudden or severe rash/hives, skin peeling, or any reaction on the inside of your mouth or nose? No Did you need to seek medical attention at a hospital or doctor's office? No When did it last happen?  as a child If all above answers are "NO", may proceed with cephalosporin use.       Vital Signs:  BP 123/69   Pulse 68   Ht _0  (1.651 m)  Wt 169 lb (76.7 kg)   LMP  (LMP Unknown)   SpO2 98%   BMI 28.12 kg/m    Neurological Exam: MENTAL STATUS including orientation to time, place, person, recent and remote memory, attention span and concentration, language, and fund of knowledge is normal.  Speech is not dysarthric.  CRANIAL NERVES:  No visual field defects.  Pupils equal round and reactive to light.  Normal conjugate, extra-ocular eye movements in all directions of gaze.  No ptosis.  Face is symmetric. Palate elevates symmetrically.  Tongue is midline.  MOTOR:  Motor strength is 5/5 in all extremities.  No atrophy, fasciculations or abnormal movements.  No pronator drift.  Tone is normal.    MSRs:  Reflexes are 2+/4 throughout, except trace at the  ankles bilaterally.  SENSATION:  Vibration reduced below the ankles.   COORDINATION/GAIT:   Gait mildly wide-based and stable  Data:  Labs 09/02/2013:  Ceruloplasmin 43, copper 171, fT4 0.98, fT3 2.6, SPEP/UPEP with IFE, 2hr GTT 100*/121/123 Labs 07/05/2013:  Vitamin B12 1225, HbA1c 6.0, TSH 1.69 Labs 06/12/2015:  ESR 11, TSH 2.24  EMG left arm 09/04/2013:  Left median neuropathy at or distal to the wrist (consistent with carpal tunnel syndrome), mild in degree electrically, based the prolongation of the median sensory distal latencies.   IMPRESSION/PLAN:  Ocular migraine with increased frequency occurring several times per week without change in symptomology.  There are no worrisome findings on exam to suggest new intracranial pathology.  She had MRI brain ordered, but after today's visit, she is leaning towards not proceeding with it. I will notify her PCP.  If she develops new neurological symptoms, imaging maybe indicated.    As far as management, I discussed that we use migraine preventative agents for ocular migraines.  In the past, she tried topirmate which she did not tolerate.  Unable to use TCAs due to prolonged QTc. She is no keen on starting any medications at this time and prefers to monitor.  If she changes her mind, I suggest offering a trial of Cymbalta.    Thank you for allowing me to participate in patient's care.  If I can answer any additional questions, I would be pleased to do so.    Sincerely,    Averey Trompeter K. Posey Pronto, DO

## 2022-11-04 NOTE — Patient Instructions (Signed)
If your symptoms get worse, please come back and see me   

## 2022-11-07 ENCOUNTER — Encounter: Payer: Self-pay | Admitting: Family Medicine

## 2022-11-10 ENCOUNTER — Ambulatory Visit: Payer: Medicare Other | Attending: Cardiology

## 2022-11-10 DIAGNOSIS — Z7901 Long term (current) use of anticoagulants: Secondary | ICD-10-CM | POA: Diagnosis not present

## 2022-11-10 DIAGNOSIS — I4819 Other persistent atrial fibrillation: Secondary | ICD-10-CM

## 2022-11-10 LAB — POCT INR: INR: 2.3 (ref 2.0–3.0)

## 2022-11-10 NOTE — Patient Instructions (Signed)
Description   Continue taking 1 tablet daily except 2 tablets each Monday, Wednesday and Friday. Repeat INR in 6 weeks. [facility] [phone]

## 2022-12-05 DIAGNOSIS — Z961 Presence of intraocular lens: Secondary | ICD-10-CM | POA: Diagnosis not present

## 2022-12-05 DIAGNOSIS — H52203 Unspecified astigmatism, bilateral: Secondary | ICD-10-CM | POA: Diagnosis not present

## 2022-12-22 ENCOUNTER — Ambulatory Visit: Payer: Medicare Other | Attending: Cardiology | Admitting: *Deleted

## 2022-12-22 DIAGNOSIS — I4819 Other persistent atrial fibrillation: Secondary | ICD-10-CM

## 2022-12-22 DIAGNOSIS — Z7901 Long term (current) use of anticoagulants: Secondary | ICD-10-CM | POA: Diagnosis not present

## 2022-12-22 LAB — POCT INR: INR: 2.7 (ref 2.0–3.0)

## 2022-12-22 NOTE — Patient Instructions (Signed)
Description   Continue taking 1 tablet daily except 2 tablets each Monday, Wednesday and Friday. Repeat INR in 6 weeks. [facility] [phone]

## 2023-01-01 ENCOUNTER — Ambulatory Visit
Admission: EM | Admit: 2023-01-01 | Discharge: 2023-01-01 | Disposition: A | Discharge status: Home / Self Care | Payer: Medicare Other | Attending: Urgent Care | Admitting: Urgent Care

## 2023-01-01 DIAGNOSIS — H109 Unspecified conjunctivitis: Secondary | ICD-10-CM

## 2023-01-01 MED ORDER — TOBRAMYCIN 0.3 % OP SOLN: 1.0000 [drp] | OPHTHALMIC | 0 refills | Status: DC

## 2023-01-01 NOTE — ED Triage Notes (Signed)
Pt c/o redness, burning, itching, discharge right eye x 4 days-NAD-steady gait

## 2023-01-01 NOTE — ED Provider Notes (Signed)
[facility]  Note:  This document was prepared using Systems analyst and may include unintentional dictation errors.  MRN: [id number] DOB: May 31, 1935  Subjective:   Adriana Hendricks is a 88 y.o. female presenting for 4-day history of acute onset persistent right eye redness, irritation, discharge and itching.  No sick contacts to her knowledge.  No vision changes.  No contact lens use.  No current facility-administered medications for this encounter.  Current Outpatient Medications:    B Complex Vitamins (VITAMIN B COMPLEX) TABS, Take 1 tablet by mouth daily., Disp: , Rfl:    calcium carbonate (TUMS - DOSED IN MG ELEMENTAL CALCIUM) 500 MG chewable tablet, Chew 1 tablet by mouth daily. , Disp: , Rfl:    cholecalciferol (VITAMIN D3) 25 MCG (1000 UT) tablet, Take 1,000 Units by mouth daily., Disp: , Rfl:    famotidine (PEPCID) 20 MG tablet, Take 20 mg by mouth at bedtime., Disp: , Rfl:    furosemide (LASIX) 20 MG tablet, Take 1 tablet (20 mg total) by mouth daily. May take an additional 20 mg if needed for swelling or shortness of breath, Disp: 110 tablet, Rfl: 3   metoprolol tartrate (LOPRESSOR) 25 MG tablet, Take 1 tablet (25 mg total) by mouth as needed., Disp: 20 tablet, Rfl: 6   Multiple Vitamins-Minerals (PRESERVISION AREDS 2 PO), Take 1 capsule by mouth in the morning and at bedtime., Disp: , Rfl:    nadolol (CORGARD) 20 MG tablet, TAKE 1 TABLET BY MOUTH ONCE DAILY, Disp: 90 tablet, Rfl: 2   potassium chloride 20 MEQ/15ML (10%) SOLN, TAKE 15 ML BY MOUTH ONCE DAILY *DILUTE WITH 4-8 OUNCES OF LIQUID* *TAKE WITH FOOD*, Disp: 15 mL, Rfl: 3   TURMERIC PO, Take 500 mg by mouth daily., Disp: , Rfl:    warfarin (COUMADIN) 2.5 MG tablet, TAKE 1 TO 2 TABLETS BY MOUTH ONCE DAILY OR AS INSTRUCTED BY COUMADIN CLINIC, Disp: 120 tablet, Rfl: 3   Allergies  Allergen Reactions   Levofloxacin Other (See Comments)    Developed tendon pain. (Has a history of a  Achilles tendon tear)   Potassium Chloride Other (See Comments)    Can't take tables   Clarithromycin Nausea Only   Erythromycin Base Nausea And Vomiting   Metronidazole Rash   Penicillins Rash and Other (See Comments)    Did it involve swelling of the face/tongue/throat, SOB, or low BP? No Did it involve sudden or severe rash/hives, skin peeling, or any reaction on the inside of your mouth or nose? No Did you need to seek medical attention at a hospital or doctor's office? No When did it last happen?  as a child If all above answers are "NO", may proceed with cephalosporin use.     Past Medical History:  Diagnosis Date   Achilles tendon rupture    Arthritis of hand, right    Atrial flutter with rapid ventricular response ([place])    Carpal tunnel syndrome 09/04/2013   Collagenous colitis 2005   Colon polyps 2010   Tubular adenoma and hyperplastic   Dental infection 10/25/2014   Diverticulosis    Esophageal stenosis    Essential tremor    GERD (gastroesophageal reflux disease)    hx hiatal hernia, hx esophagitis, hx stricture   Hiatal hernia    Hypertension    Lower extremity neuropathy 08/23/2013   On B12 therapy with numbness in the feet bilaterally no evidence of diabetes    Ocular migraine  jagged vision, a few per month   OSA (obstructive sleep apnea) 12/21/2016   on CPAP   Peripheral neuropathy    treated by Dr Posey Pronto (07/2015)   Persistent atrial fibrillation ([place])    Rate control with beta-blocker and anticoagulated with warfarin   Pleural effusion, left    S/P Maze operation for atrial fibrillation 06/12/2019   Complete bilateral atrial lesion set using cryothermy and bipolar radiofrequency ablation with clipping of LA appendage via right mini thoracotomy approach   S/P minimally invasive tricuspid valve repair 06/12/2019   Complex valvuloplasty including autologous pericardial patch augmentation of anterior and posterior leaflets with 28 mm Edwards mc3 ring  annuloplasty via right mini thoracotomy approach   Severe tricuspid regurgitation 07/2018   Noted Aug 2019 during a fib workup. Severe LAE as well   Sinus node dysfunction ([facility])      Past Surgical History:  Procedure Laterality Date   48 hr Holter Monitor  07/2018   Persistent Afib (rate 33 - 124 bpm)   APPENDECTOMY     CARDIAC CATHETERIZATION     CARDIOVERSION N/A 10/02/2018   Procedure: CARDIOVERSION;  Surgeon: Acie Fredrickson Wonda Cheng, MD;  Location: [facility];  Service: Cardiovascular;  Laterality: N/A;   CARPAL TUNNEL RELEASE     CATARACT EXTRACTION     EXCISION MORTON'S NEUROMA     Right foot   EYE SURGERY     Cataracts with implants-bilateral   INTRAOCULAR LENS IMPLANT, SECONDARY     IR THORACENTESIS ASP PLEURAL SPACE W/IMG GUIDE  07/10/2019   IR THORACENTESIS ASP PLEURAL SPACE W/IMG GUIDE  07/10/2019   IR THORACENTESIS ASP PLEURAL SPACE W/IMG GUIDE  07/26/2019   MINIMALLY INVASIVE MAZE PROCEDURE N/A 06/12/2019   Procedure: MINIMALLY INVASIVE MAZE PROCEDURE;  Surgeon: Rexene Alberts, MD;  Location: [facility];  Service: Open Heart Surgery;  Laterality: N/A;   MINIMALLY INVASIVE TRICUSPID VALVE REPAIR Right 06/12/2019   Procedure: MINIMALLY INVASIVE TRICUSPID VALVE REPAIR USING EDWARDS MC3 T28 ANNULOPLASTY RING, INSERTION TEMPORARY TRANSVENOUS AV PACING LEAD;  Surgeon: Rexene Alberts, MD;  Location: [facility];  Service: Open Heart Surgery;  Laterality: Right;   Moles removed     MOUTH SURGERY     (For Exostosis)   NUCLEAR  07/2018   EF 71 %. LOW RISK - no ischemia or Infarct.    PACEMAKER IMPLANT N/A 12/31/2021   Procedure: PACEMAKER IMPLANT;  Surgeon: Vickie Epley, MD;  Location: [facility];  Service: Cardiovascular;  Laterality: N/A;   RIGHT/LEFT HEART CATH AND CORONARY ANGIOGRAPHY N/A 01/16/2019   Procedure: RIGHT/LEFT HEART CATH AND CORONARY ANGIOGRAPHY;  Surgeon: Leonie Man, MD;  Location: [facility];; Angiographically normal coronary arteries.  Normal  LVEDP. Upper Limit of Normal PAP~23 mmHg & PCWP 18 mmHg.  LVEDP of 7 mmHg. -With mean PAP of 23 mmHg there is a TPG suggesting a primary pulmonary etiology.   ROTATOR CUFF REPAIR     TEE WITHOUT CARDIOVERSION N/A 01/16/2019   Procedure: TRANSESOPHAGEAL ECHOCARDIOGRAM (TEE);  Surgeon: Sueanne Margarita, MD;  Location: [facility];;  Severe TR due to poor coaptation of the leaflets from annular dilation.  Mild to moderate mitral vegetation with mildly restricted mobility of the posterior leaflet.  EF 55-60% with no R WMA.  Severe RA and LA dilation.   TEE WITHOUT CARDIOVERSION N/A 06/12/2019   Procedure: TRANSESOPHAGEAL ECHOCARDIOGRAM (TEE);  Surgeon: Rexene Alberts, MD;  Location: [facility];  Service: Open Heart Surgery;  Laterality: N/A;  TONSILLECTOMY     TRANSTHORACIC ECHOCARDIOGRAM  08/05/2019   First postop AVR: EF 60 -65%. Mod Conc LVH. Gr III DD (? w/ Mild LA dilation).  Rheumatic MV w/ mod thickening & Ca2+. Mild doming of AML - NO MVP.  Mild-mod MR & MS (mean MVA gradient 7 mmHg).  ~ functional bicuspid AoV w/ Mod Sclerosis - no AS.  mild TA dilation;; b) 2 y/r Post-Op: EF 60-65%.  No RWMA. ?D Fxn w/ mild LA dil. High PAP ~64.5 mmHg.  Mild-Mod RA dil. Mod MR; Severe TR (repaired)   TRANSTHORACIC ECHOCARDIOGRAM  03/30/2022   EF 55 to 60%.  No RWMA.  Mild LVH.  Indeterminate filling pressures.  Mild LA dilation.  Mildly elevated PAP with Moderate to severe TR, and moderate RA dilation -> normal RAP.  Aortic valve appears to be functionally bicuspid with fused left and right cusps with moderate calcification.  Mild AS.  Aortic root dilated to 42 mm.; Comparison(s): 09/08/21 EF 60-65%. Moderate MR. Moderate-severe TR.    Family History  Problem Relation Age of Onset   Rectal cancer Father        colostomy   Heart failure Father        Died, 41   Other Mother        Died, 98. old age.    Barrett's esophagus Son    Colon cancer Neg Hx    Esophageal cancer Neg Hx    Stomach cancer Neg Hx      Social History   Tobacco Use   Smoking status: Former    Packs/day: 2.00    Years: 13.00    Total pack years: 26.00    Types: Cigarettes    Start date: 12/26/1954    Quit date: 03/26/1968    Years since quitting: 54.8   Smokeless tobacco: Never   Tobacco comments:    Quit in 1969 - smokeed 2-3PPD , was 3 PPD at her heaviest for about 3 years.   Vaping Use   Vaping Use: Never used  Substance Use Topics   Alcohol use: Yes    Alcohol/week: 2.0 standard drinks of alcohol    Types: 2 Glasses of wine per week    Comment: 2 glass of wine per day   Drug use: No    ROS   Objective:   Vitals: BP (!) 167/81 (BP Location: Right Arm)   Pulse 81   Temp 99.8 F (37.7 C)   Resp 18   LMP  (LMP Unknown)   SpO2 93%   Physical Exam Constitutional:      General: She is not in acute distress.    Appearance: Normal appearance. She is well-developed. She is not ill-appearing, toxic-appearing or diaphoretic.  HENT:     Head: Normocephalic and atraumatic.     Nose: Nose normal.     Mouth/Throat:     Mouth: Mucous membranes are moist.  Eyes:     General: Lids are normal. Lids are everted, no foreign bodies appreciated. Vision grossly intact. No scleral icterus.       Right eye: Discharge (medially) present. No foreign body or hordeolum.        Left eye: No foreign body, discharge or hordeolum.     Extraocular Movements: Extraocular movements intact.     Right eye: Normal extraocular motion.     Left eye: Normal extraocular motion and no nystagmus.     Conjunctiva/sclera:     Right eye: Right conjunctiva is injected. No chemosis, exudate  or hemorrhage.    Left eye: Left conjunctiva is not injected. No chemosis, exudate or hemorrhage. Cardiovascular:     Rate and Rhythm: Normal rate.  Pulmonary:     Effort: Pulmonary effort is normal.  Skin:    General: Skin is warm and dry.  Neurological:     General: No focal deficit present.     Mental Status: She is alert and oriented to  person, place, and time.  Psychiatric:        Mood and Affect: Mood normal.        Behavior: Behavior normal.     Assessment and Plan :   PDMP not reviewed this encounter.  1. Bacterial conjunctivitis of right eye     Will start tobramycin to address bacterial conjunctivitis of the right eye.  No signs of an acute ophthalmologic emergency.  Counseled patient on potential for adverse effects with medications prescribed/recommended today, ER and return-to-clinic precautions discussed, patient verbalized understanding.    Jaynee Eagles, [place] 01/01/23 1547

## 2023-01-02 ENCOUNTER — Ambulatory Visit (INDEPENDENT_AMBULATORY_CARE_PROVIDER_SITE_OTHER): Payer: Medicare Other

## 2023-01-02 ENCOUNTER — Telehealth: Payer: Self-pay | Admitting: Family Medicine

## 2023-01-02 DIAGNOSIS — I442 Atrioventricular block, complete: Secondary | ICD-10-CM

## 2023-01-02 NOTE — Telephone Encounter (Signed)
Pt was seen at [facility] on 01/01/23.  Patient Name: Adriana Hendricks Gender: Female DOB: 1935-12-16 Age: 88 Y 58 M 16 D Return Phone Number: [phone] (Primary) Address: City/ State/ Zip: [place]  [address] Client [place] [facility] [place] Client Site [place] [facility] [place] Night Provider Garret Reddish- MD Contact Type Call Who Is Calling Patient / Member / Family / Caregiver Call Type Triage / Clinical Relationship To Patient Self Return Phone Number [phone] (Primary) Chief Complaint Eye Pain Reason for Call Symptomatic / Request for [place] states she has discharge in her right eye and is blurry and painful. Translation No Nurse Assessment Nurse: Eugenio Hoes, RN, Jenny Reichmann Date/Time (Eastern Time): 01/01/2023 10:35:00 AM Confirm and document reason for call. If symptomatic, describe symptoms. ---Caller states that her eye is having discharge, blurry, redness, itching, and pain. Caller states that blurriness goes away with cleaning the eye. Caller states that eyelid is red and moderately swollen. Denies fever currently. Does the patient have any new or worsening symptoms? ---Yes Will a triage be completed? ---Yes Related visit to physician within the last 2 weeks? ---No Does the PT have any chronic conditions? (i.e. diabetes, asthma, this includes High risk factors for pregnancy, etc.) ---Yes List chronic conditions. ---pacemaker, Is this a behavioral health or substance abuse call? ---No Guidelines Guideline Title Affirmed Question Affirmed Notes Nurse Date/Time Eilene Ghazi Time) Eye - Pus or Discharge Blurred vision Lynett Fish 1/7/ Disp. Time Eilene Ghazi Time) Disposition Final User 01/01/2023 10:42:24 AM See HCP within 4 Hours (or PCP triage) Yes Eugenio Hoes, RN, Jenny Reichmann Final Disposition 01/01/2023 10:42:24 AM See HCP within 4 Hours (or PCP triage) Yes Eugenio Hoes, RN, [place] Disagree/Comply  Comply Caller Understands Yes PreDisposition Call Doctor Care Advice Given Per Guideline SEE HCP (OR PCP TRIAGE) WITHIN 4 HOURS: CARE ADVICE given per Eye - Pus or Discharge (Adult) guideline. * IF OFFICE WILL BE CLOSED AND NO PCP (PRIMARY CARE PROVIDER) SECOND-LEVEL TRIAGE: You need to be seen within the next 3 or 4 hours. A nearby Urgent Care Center [facility]) is often a good source of care. Another choice is to go to the ED. Go sooner if you become worse. Referrals [place] [facility] [place] [facility] [place]

## 2023-01-03 LAB — CUP PACEART REMOTE DEVICE CHECK
Date Time Interrogation Session: 20240108101235
Implantable Lead Connection Status: 753985
Implantable Lead Connection Status: 753985
Implantable Lead Implant Date: 20230106
Implantable Lead Implant Date: 20230106
Implantable Lead Location: 753858
Implantable Lead Location: 753859
Implantable Lead Model: 377171
Implantable Lead Model: 377171
Implantable Lead Serial Number: 8000603602
Implantable Lead Serial Number: 8000650012
Implantable Pulse Generator Implant Date: 20230106
Pulse Gen Model: 407145
Pulse Gen Serial Number: 70291848

## 2023-01-16 ENCOUNTER — Other Ambulatory Visit: Payer: Self-pay | Admitting: Cardiology

## 2023-01-23 ENCOUNTER — Other Ambulatory Visit: Payer: Self-pay | Admitting: Cardiology

## 2023-01-30 DIAGNOSIS — H04123 Dry eye syndrome of bilateral lacrimal glands: Secondary | ICD-10-CM | POA: Diagnosis not present

## 2023-01-30 DIAGNOSIS — H0100A Unspecified blepharitis right eye, upper and lower eyelids: Secondary | ICD-10-CM | POA: Diagnosis not present

## 2023-02-02 ENCOUNTER — Ambulatory Visit: Payer: Medicare Other | Attending: Cardiology

## 2023-02-02 DIAGNOSIS — Z7901 Long term (current) use of anticoagulants: Secondary | ICD-10-CM | POA: Diagnosis not present

## 2023-02-02 DIAGNOSIS — I4819 Other persistent atrial fibrillation: Secondary | ICD-10-CM

## 2023-02-02 LAB — POCT INR: INR: 3.4 — AB (ref 2.0–3.0)

## 2023-02-02 NOTE — Patient Instructions (Signed)
Description   Only take 0.5 tablet today and then continue taking 1 tablet daily except 2 tablets each Monday, Wednesday and Friday.  Repeat INR in 5 weeks.  Coumadin Clinic [phone]

## 2023-02-03 NOTE — Progress Notes (Signed)
Remote pacemaker transmission.   

## 2023-03-06 DIAGNOSIS — L905 Scar conditions and fibrosis of skin: Secondary | ICD-10-CM | POA: Diagnosis not present

## 2023-03-06 DIAGNOSIS — L570 Actinic keratosis: Secondary | ICD-10-CM | POA: Diagnosis not present

## 2023-03-06 DIAGNOSIS — L814 Other melanin hyperpigmentation: Secondary | ICD-10-CM | POA: Diagnosis not present

## 2023-03-06 DIAGNOSIS — L820 Inflamed seborrheic keratosis: Secondary | ICD-10-CM | POA: Diagnosis not present

## 2023-03-06 DIAGNOSIS — L821 Other seborrheic keratosis: Secondary | ICD-10-CM | POA: Diagnosis not present

## 2023-03-06 DIAGNOSIS — D229 Melanocytic nevi, unspecified: Secondary | ICD-10-CM | POA: Diagnosis not present

## 2023-03-09 ENCOUNTER — Ambulatory Visit: Payer: Medicare Other | Attending: Internal Medicine | Admitting: *Deleted

## 2023-03-09 DIAGNOSIS — Z7901 Long term (current) use of anticoagulants: Secondary | ICD-10-CM | POA: Insufficient documentation

## 2023-03-09 DIAGNOSIS — I4819 Other persistent atrial fibrillation: Secondary | ICD-10-CM | POA: Diagnosis not present

## 2023-03-09 LAB — POCT INR: INR: 3.2 — AB (ref 2.0–3.0)

## 2023-03-09 NOTE — Patient Instructions (Signed)
Description   Today take 1/2 tablet of warfarin today then START taking 1 tablet daily except 2 tablets each Monday and Friday. Repeat INR in 4 weeks.  Coumadin Clinic [phone]

## 2023-03-17 ENCOUNTER — Ambulatory Visit (INDEPENDENT_AMBULATORY_CARE_PROVIDER_SITE_OTHER): Payer: Medicare Other | Admitting: Family Medicine

## 2023-03-17 ENCOUNTER — Encounter: Payer: Self-pay | Admitting: Family Medicine

## 2023-03-17 VITALS — BP 130/70 | HR 102 | Temp 97.7°F | Ht 65.0 in | Wt 168.2 lb

## 2023-03-17 DIAGNOSIS — I4819 Other persistent atrial fibrillation: Secondary | ICD-10-CM

## 2023-03-17 DIAGNOSIS — I10 Essential (primary) hypertension: Secondary | ICD-10-CM

## 2023-03-17 DIAGNOSIS — K625 Hemorrhage of anus and rectum: Secondary | ICD-10-CM

## 2023-03-17 DIAGNOSIS — Z8719 Personal history of other diseases of the digestive system: Secondary | ICD-10-CM

## 2023-03-17 DIAGNOSIS — M25562 Pain in left knee: Secondary | ICD-10-CM

## 2023-03-17 LAB — CBC WITH DIFFERENTIAL/PLATELET
Basophils Absolute: 0 10*3/uL (ref 0.0–0.1)
Basophils Relative: 0.3 % (ref 0.0–3.0)
Eosinophils Absolute: 0 10*3/uL (ref 0.0–0.7)
Eosinophils Relative: 0.6 % (ref 0.0–5.0)
HCT: 40.6 % (ref 36.0–46.0)
Hemoglobin: 13.7 g/dL (ref 12.0–15.0)
Lymphocytes Relative: 11.9 % — ABNORMAL LOW (ref 12.0–46.0)
Lymphs Abs: 0.9 10*3/uL (ref 0.7–4.0)
MCHC: 33.7 g/dL (ref 30.0–36.0)
MCV: 95.5 fl (ref 78.0–100.0)
Monocytes Absolute: 0.6 10*3/uL (ref 0.1–1.0)
Monocytes Relative: 7.3 % (ref 3.0–12.0)
Neutro Abs: 6.2 10*3/uL (ref 1.4–7.7)
Neutrophils Relative %: 79.9 % — ABNORMAL HIGH (ref 43.0–77.0)
Platelets: 179 10*3/uL (ref 150.0–400.0)
RBC: 4.25 Mil/uL (ref 3.87–5.11)
RDW: 13.9 % (ref 11.5–15.5)
WBC: 7.7 10*3/uL (ref 4.0–10.5)

## 2023-03-17 LAB — COMPREHENSIVE METABOLIC PANEL
ALT: 18 U/L (ref 0–35)
AST: 20 U/L (ref 0–37)
Albumin: 4.7 g/dL (ref 3.5–5.2)
Alkaline Phosphatase: 93 U/L (ref 39–117)
BUN: 16 mg/dL (ref 6–23)
CO2: 31 mEq/L (ref 19–32)
Calcium: 9.8 mg/dL (ref 8.4–10.5)
Chloride: 98 mEq/L (ref 96–112)
Creatinine, Ser: 0.88 mg/dL (ref 0.40–1.20)
GFR: 59.82 mL/min — ABNORMAL LOW (ref >60.00)
Glucose, Bld: 98 mg/dL (ref 70–99)
Potassium: 3.9 mEq/L (ref 3.5–5.1)
Sodium: 140 mEq/L (ref 135–145)
Total Bilirubin: 1.5 mg/dL — ABNORMAL HIGH (ref 0.2–1.2)
Total Protein: 7.6 g/dL (ref 6.0–8.3)

## 2023-03-17 LAB — PROTIME-INR
INR: 2.3 ratio — ABNORMAL HIGH (ref 0.8–1.0)
Prothrombin Time: 24.1 s — ABNORMAL HIGH (ref 9.6–13.1)

## 2023-03-17 NOTE — Progress Notes (Signed)
Phone [phone] In person visit   Subjective:   Adriana Hendricks is a 88 y.o. year old very pleasant female patient who presents for/with See problem oriented charting Chief Complaint  Patient presents with   left knee pain    Pt would like to discuss PT referral at [facility] for left knee pain   Blood In Stools    Pt c/o blood in stool that was pink and now getting darker that she noticed after using voltaren rub.   Past Medical History-  Patient Active Problem List   Diagnosis Date Noted   Sinus node dysfunction ([place])     Priority: High   S/P minimally invasive tricuspid valve repair 06/12/2019    Priority: High   S/P Maze operation for atrial fibrillation 06/12/2019    Priority: High   Rheumatic tricuspid valve regurgitation     Priority: High   Persistent atrial fibrillation ([place])     Priority: High   Osteopenia 03/13/2017    Priority: High   Hyperglycemia 10/26/2021    Priority: Medium    Bicuspid aortic valve 01/28/2019    Priority: Medium    DOE (dyspnea on exertion) 07/26/2018    Priority: Medium    Ocular migraine 03/13/2017    Priority: Medium    OSA (obstructive sleep apnea) 12/21/2016    Priority: Medium    Lower extremity neuropathy 08/23/2013    Priority: Medium    Hyperlipidemia with target LDL less than 100 01/07/2010    Priority: Medium    GERD with stricture 06/19/2009    Priority: Medium    Essential tremor 11/16/2007    Priority: Medium    Essential hypertension 11/16/2007    Priority: Medium    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    Priority: Low   Tricuspid valve insufficiency     Priority: Low   Long term (current) use of anticoagulants: CHA2DS2Vasc 5, WARFARIN - Rheumatic Vavle Disease 08/28/2018    Priority: Low   IBS (irritable bowel syndrome) 09/12/2016    Priority: Low   History of adenomatous polyp of colon 09/12/2016    Priority: Low   Former smoker 09/12/2016    Priority: Low   Carpal tunnel syndrome 09/04/2013     Priority: Low   Achilles tendon tear 01/17/2011    Priority: Low   ACNE ROSACEA 04/03/2008    Priority: Low   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     calcium carbonate (TUMS - DOSED IN MG ELEMENTAL CALCIUM) 500 MG chewable tablet Chew 1 tablet by mouth daily.      cholecalciferol (VITAMIN D3) 25 MCG (1000 UT) tablet Take 1,000 Units by mouth daily.     famotidine (PEPCID) 20 MG tablet Take 20 mg by mouth at bedtime.     furosemide (LASIX) 20 MG tablet Take 1 tablet (20 mg total) by mouth daily. May take an additional 20 mg if needed for swelling or shortness of breath 110 tablet 3   metoprolol tartrate (LOPRESSOR) 25 MG tablet Take 1 tablet (25 mg total) by mouth as needed. 20 tablet 6   Multiple Vitamins-Minerals (PRESERVISION AREDS 2 PO) Take 1 capsule by mouth in the morning and at bedtime.  nadolol (CORGARD) 20 MG tablet TAKE 1 TABLET BY MOUTH ONCE DAILY 90 tablet 3   potassium chloride 20 MEQ/15ML (10%) SOLN TAKE 15 ML BY MOUTH ONCE DAILY *DILUTE WITH 4-8 OUNCES OF LIQUID* *TAKE WITH FOOD* 15 mL 10   tobramycin (TOBREX) 0.3 % ophthalmic solution Place 1 drop into the right eye every 4 (four) hours. 5 mL 0   TURMERIC PO Take 500 mg by mouth daily.     warfarin (COUMADIN) 2.5 MG tablet TAKE 1 TO 2 TABLETS BY MOUTH ONCE DAILY OR AS INSTRUCTED BY COUMADIN CLINIC 120 tablet 5   No current facility-administered medications for this visit.     Objective:  BP 130/70   Pulse (!) 102   Temp 97.7 F (36.5 C)   Ht 5\' 5"  (1.651 m)   Wt 168 lb 3.2 oz (76.3 kg)   LMP  (LMP Unknown)   SpO2 98%   BMI 27.99 kg/m  Gen: NAD, resting comfortably CV: RRR -heart rate high normal on my exam but  not toxic cardiac as on initial check Lungs: CTAB no crackles, wheeze, rhonchi Abdomen: soft/nontender/nondistended/normal bowel sounds. Ext: trace edema Skin: warm, dry Neuro: Walking with walker MSK: Medial joint line pain on left knee-mild lateral joint line pain     Assessment and Plan   # Left knee pain S:started 3 weeks ago- was walking quickly and went to turn and foot stayed planted and started hurting in the medial knee and some into the back of the knee. Ice helps some . Did have one day where knee gave out - has used walker since that time with no issues.  Pain mainly with walking or palpating on the area A/P: Suspect flare of arthritis.  Voltaren gel is going to be stopped as below.  Can use Tylenol for pain -Patient is interested in physical [facility] at [name] for [facility] knee pain  # Bright red blood per rectum/history of hemorrhoids S: Patient noted pink discoloration in her stool on tissue with wiping- now visible sees bright red blood per rectum in the toilet. Very small volume- has history of hemorrhoids -This started after using Voltaren gel.  Of note she is on Coumadin for atrial fibrillation 2 checks ago wsa 3.4 then made adjustments and got down to 3.2 and scheduled on 04/03/23 for next check after further adjustment in meds - hsa noted the blood 3 days total over 2 weeks- not every bowel movement -no chest pain, shortness of breath, worsening fatigue, no melena -last blood in stool this morning- low volume A/P: Bright red blood per rectum but small volume and patient does have history of hemorrhoids though she does not feel anything externally-most strongly suspect hemorrhoidal bleeding - INR had been elevated so we will check with blood work today-May need to hold the dose of Coumadin potentially-would prefer not to stop unless bleeding worsens or fails to slow down over coming weeks - Check CBC to make sure no significant anemia though -Strict return precautions  given   # Atrial fibrillation-follows with Dr. [name] S: Rate controlled with nadolol 20 mg daily.  Has metoprolol available if needed Anticoagulated with Coumadin -a fib maze/tricuspid valve replacement late June 12, 2019 hospitalization A/P: Appropriately anticoagulated (though INR slightly elevated) on Coumadin and rate control-continue current medication for now as long as INR is trending down   #hypertension and essential tremor S: medication: Lasix 20 mg, nadolol 20 mg daily  BP Readings from Last 3 Encounters:  03/17/23 130/70  01/01/23 Marland Kitchen)  167/81  11/04/22 123/69   A/P: Reasonable control for her age-continue current medication Tremor overall stable-continue current medication  Recommended follow up: Return for as needed for new, worsening, persistent symptoms.  I asked her to update me in about 10 days with how she is doing Future Appointments  Date Time Provider [place]  04/03/2023  7:05 AM [facility] DEVICE REMOTES [facility]  04/06/2023  2:15 PM [facility]IN [facility] None  05/10/2023  8:00 AM Marin Olp, MD [facility]  07/03/2023  7:05 AM [facility] DEVICE REMOTES [facility]  10/02/2023  7:05 AM [facility] DEVICE REMOTES [facility]  01/01/2024  7:05 AM [facility] DEVICE REMOTES [facility]  04/01/2024  7:05 AM [facility] DEVICE REMOTES [facility]  07/01/2024  7:05 AM [facility] DEVICE REMOTES [facility]  09/30/2024  7:05 AM [facility] DEVICE REMOTES [facility]  12/30/2024  7:05 AM [facility] DEVICE REMOTES [facility]   Lab/Order associations:   ICD-10-CM   1. Left knee pain, unspecified chronicity  M25.562 Ambulatory referral to Physical Therapy    2. Persistent atrial fibrillation (HCC)  I48.19 Protime-INR    3. Essential hypertension  I10 CBC with Differential/Platelet    Comprehensive metabolic panel    4. Acute pain of left  knee  M25.562 Ambulatory referral to Physical Therapy    5. Rectal bleeding  K62.5     6. History of hemorrhoids  Z87.19      No orders of the defined types were placed in this encounter.  Return precautions advised.  Garret Reddish, MD

## 2023-03-17 NOTE — Patient Instructions (Addendum)
Take printout to [facility]  Lets check blood counts and INR- sounds like hemorrhoid bleed but if this doesn't stop soon then we may need GI consult or to hold coumadin- we prefer to avoid this  I agree with you- stop voltaren   Recommended follow up: Return for as needed for new, worsening, persistent symptoms. - if you have chest pain or shortness of breath or increasing volume of blood loss or increasing frequency please seek care

## 2023-03-21 DIAGNOSIS — M6281 Muscle weakness (generalized): Secondary | ICD-10-CM | POA: Diagnosis not present

## 2023-03-21 DIAGNOSIS — M25562 Pain in left knee: Secondary | ICD-10-CM | POA: Diagnosis not present

## 2023-03-24 DIAGNOSIS — M25562 Pain in left knee: Secondary | ICD-10-CM | POA: Diagnosis not present

## 2023-03-24 DIAGNOSIS — M6281 Muscle weakness (generalized): Secondary | ICD-10-CM | POA: Diagnosis not present

## 2023-03-27 DIAGNOSIS — M6281 Muscle weakness (generalized): Secondary | ICD-10-CM | POA: Diagnosis not present

## 2023-03-27 DIAGNOSIS — M25562 Pain in left knee: Secondary | ICD-10-CM | POA: Diagnosis not present

## 2023-03-30 ENCOUNTER — Encounter: Payer: Self-pay | Admitting: Family Medicine

## 2023-03-30 ENCOUNTER — Other Ambulatory Visit: Payer: Self-pay

## 2023-03-30 DIAGNOSIS — M25562 Pain in left knee: Secondary | ICD-10-CM

## 2023-03-31 NOTE — Progress Notes (Unsigned)
   Rubin Payor, PhD, LAT, ATC acting as a scribe for Clementeen Graham, MD.  Subjective:    CC: Left knee pain  HPI: Patient is an 88 year old female presenting with left knee pain ongoing since early March.  She recalls walking quickly and suffering a rotational mechanism and feeling pain in her left knee.  Patient locates pain to***  L Knee swelling: Mechanical symptoms: Aggravates: Treatments tried: ambulating w/ a walker, Tylenol, Voltaren gel, ice,   Pertinent review of Systems: ***  Relevant historical information: ***   Objective:   There were no vitals filed for this visit. General: Well Developed, well nourished, and in no acute distress.   MSK: ***  Lab and Radiology Results No results found for this or any previous visit (from the past 72 hour(s)). No results found.    Impression and Recommendations:    Assessment and Plan: 88 y.o. female with ***.  PDMP not reviewed this encounter. No orders of the defined types were placed in this encounter.  No orders of the defined types were placed in this encounter.   Discussed warning signs or symptoms. Please see discharge instructions. Patient expresses understanding.   ***

## 2023-04-03 ENCOUNTER — Encounter: Payer: Self-pay | Admitting: Family Medicine

## 2023-04-03 ENCOUNTER — Ambulatory Visit (INDEPENDENT_AMBULATORY_CARE_PROVIDER_SITE_OTHER): Payer: Medicare Other

## 2023-04-03 ENCOUNTER — Other Ambulatory Visit: Payer: Self-pay

## 2023-04-03 ENCOUNTER — Ambulatory Visit (INDEPENDENT_AMBULATORY_CARE_PROVIDER_SITE_OTHER): Payer: Medicare Other | Admitting: Family Medicine

## 2023-04-03 VITALS — BP 144/82 | HR 82 | Ht 65.0 in | Wt 167.2 lb

## 2023-04-03 DIAGNOSIS — I442 Atrioventricular block, complete: Secondary | ICD-10-CM | POA: Diagnosis not present

## 2023-04-03 DIAGNOSIS — M25562 Pain in left knee: Secondary | ICD-10-CM

## 2023-04-03 NOTE — Patient Instructions (Addendum)
Thank you for coming in today.   Please get an Xray today before you leave   You received an injection today. Seek immediate medical attention if the joint becomes red, extremely painful, or is oozing fluid.   Continue physical therapy  Check back in 6 weeks

## 2023-04-04 LAB — CUP PACEART REMOTE DEVICE CHECK
Battery Voltage: 90
Date Time Interrogation Session: 20240409080308
Implantable Lead Connection Status: 753985
Implantable Lead Connection Status: 753985
Implantable Lead Implant Date: 20230106
Implantable Lead Implant Date: 20230106
Implantable Lead Location: 753858
Implantable Lead Location: 753859
Implantable Lead Model: 377171
Implantable Lead Model: 377171
Implantable Lead Serial Number: 8000603602
Implantable Lead Serial Number: 8000650012
Implantable Pulse Generator Implant Date: 20230106
Pulse Gen Model: 407145
Pulse Gen Serial Number: 70291848

## 2023-04-05 NOTE — Progress Notes (Signed)
Left knee x-ray shows mild to medium arthritis

## 2023-04-06 ENCOUNTER — Ambulatory Visit: Payer: Medicare Other | Attending: Cardiology | Admitting: *Deleted

## 2023-04-06 DIAGNOSIS — I4819 Other persistent atrial fibrillation: Secondary | ICD-10-CM | POA: Insufficient documentation

## 2023-04-06 DIAGNOSIS — Z7901 Long term (current) use of anticoagulants: Secondary | ICD-10-CM | POA: Diagnosis not present

## 2023-04-06 LAB — POCT INR: INR: 3.1 — AB (ref 2.0–3.0)

## 2023-04-06 NOTE — Patient Instructions (Signed)
Description   Today take 1/2 tablet of warfarin today then START taking 1 tablet daily except 2 tablets each Friday. Repeat INR in 3 weeks.  Coumadin Clinic [phone]

## 2023-04-07 DIAGNOSIS — M6281 Muscle weakness (generalized): Secondary | ICD-10-CM | POA: Diagnosis not present

## 2023-04-07 DIAGNOSIS — M25562 Pain in left knee: Secondary | ICD-10-CM | POA: Diagnosis not present

## 2023-04-20 ENCOUNTER — Ambulatory Visit (INDEPENDENT_AMBULATORY_CARE_PROVIDER_SITE_OTHER): Payer: Medicare Other | Admitting: Pulmonary Disease

## 2023-04-20 ENCOUNTER — Encounter (HOSPITAL_BASED_OUTPATIENT_CLINIC_OR_DEPARTMENT_OTHER): Payer: Self-pay | Admitting: Pulmonary Disease

## 2023-04-20 VITALS — BP 130/70 | HR 76 | Ht 65.0 in | Wt 166.4 lb

## 2023-04-20 DIAGNOSIS — G4733 Obstructive sleep apnea (adult) (pediatric): Secondary | ICD-10-CM

## 2023-04-20 NOTE — Patient Instructions (Signed)
Will arrange for a new auto CPAP machine  Follow up in 4 months 

## 2023-04-20 NOTE — Progress Notes (Signed)
[facility]  Chief Complaint  Patient presents with   Follow-up    No issues with cpap    Constitutional:  BP 130/70 (BP Location: Left Arm, Cuff Size: Large)   Pulse 76   Ht  (1.651 m)   Wt 166 lb 6.4 oz (75.5 kg)   LMP  (LMP Unknown)   SpO2 96%   BMI 27.69 kg/m   Past Medical History:  TR s/p repair June 2020, A fib s/p MAZE, Neuropathy, HTN, HH, GERD, Essential tremor, Esophageal stenosis, Diverticulosis, Colon polyps, Collagenous colitis, OA  Past Surgical History:  She  has a past surgical history that includes Appendectomy; Tonsillectomy; Cataract extraction; Moles removed; Excision Morton's neuroma; Mouth surgery; Intraocular lens implant, secondary; Carpal tunnel release; Rotator cuff repair; 48 hr Holter Monitor (07/2018); NUCLEAR (07/2018); Cardioversion (N/A, 10/02/2018); TEE without cardioversion (N/A, 01/16/2019); RIGHT/LEFT HEART CATH AND CORONARY ANGIOGRAPHY (N/A, 01/16/2019); Eye surgery; Cardiac catheterization; Minimally invasive tricuspid valve repair (Right, 06/12/2019); Minimally invasive maze procedure (N/A, 06/12/2019); TEE without cardioversion (N/A, 06/12/2019); IR THORACENTESIS ASP PLEURAL SPACE W/IMG GUIDE (07/10/2019); IR THORACENTESIS ASP PLEURAL SPACE W/IMG GUIDE (07/10/2019); IR THORACENTESIS ASP PLEURAL SPACE W/IMG GUIDE (07/26/2019); transthoracic echocardiogram (08/05/2019); PACEMAKER IMPLANT (N/A, 12/31/2021); and transthoracic echocardiogram (03/30/2022).  Brief Summary:  Adriana Hendricks is a 88 y.o. female with obstructive sleep apnea.      Subjective:   She is here with her husband.  No issues with CPAP set up.  Using nasal cushion.  Occasionally has dry mouth, but not bad.  No further syncopal events since she had PPM.  Physical Exam:   Appearance - well kempt   ENMT - no sinus tenderness, no oral exudate, no LAN, Mallampati 3 airway, no stridor  Respiratory - equal breath sounds  bilaterally, no wheezing or rales  CV - s1s2 regular rate and rhythm, 2/6  Ext - no clubbing, no edema  Skin - no rashes  Psych - normal mood and affect    Sleep Tests:  HST 12/14/16 >> AHI 19.5, SaO2 low 78% Auto CPAP 03/21/23 to 04/19/23 >> used on 30 of 30 nights with average AHI 8 hrs 34 min.  Average AHI 0.3 with median CPAP 7 and 95 th percentile CPAP 8 cm H2O  Cardiac Tests:  Echo 03/30/22 >> EF 55 to 60%, mild LVH, mild MR, s/p TVR, mild AS/AR, aortic root 42 mm  Social History:  She  reports that she quit smoking about 55 years ago. Her smoking use included cigarettes. She started smoking about 68 years ago. She has a 26.00 pack-year smoking history. She has never used smokeless tobacco. She reports current alcohol use of about 2.0 standard drinks of alcohol per week. She reports that she does not use drugs.  Family History:  Her family history includes Barrett's esophagus in her son; Heart failure in her father; Other in her mother; Rectal cancer in her father.     Assessment/Plan:   Obstructive sleep apnea. - she is compliant with therapy and reports benefit from CPAP - uses [employer] for her DME - her current CPAP is more than 86 yrs old - will arrange for a new [employer]med auto CPAP 5 to 8 cm H2O  Persistent atrial fibrillation, rheumatic valve disease, Complete Heart block s/p PM. - followed by Dr. Bryan Lemma and Dr. Steffanie Dunn with cardiology  Time Spent Involved in Patient Care on Day of Examination:  26 minutes  Follow up:   Patient Instructions  Will arrange for a new auto CPAP machine  Follow up in 4 months  Medication List:   Allergies as of 04/20/2023       Reactions   Levofloxacin Other (See Comments)   Developed tendon pain. (Has a history of a Achilles tendon tear)   Potassium Chloride Other (See Comments)   Can't take tables   Clarithromycin Nausea Only   Erythromycin Base Nausea And Vomiting   Metronidazole Rash   Penicillins Rash, Other  (See Comments)   Did it involve swelling of the face/tongue/throat, SOB, or low BP? No Did it involve sudden or severe rash/hives, skin peeling, or any reaction on the inside of your mouth or nose? No Did you need to seek medical attention at a hospital or doctor's office? No When did it last happen?  as a child If all above answers are "NO", may proceed with cephalosporin use.        Medication List        Accurate as of April 20, 2023 11:33 AM. If you have any questions, ask your nurse or doctor.          calcium carbonate 500 MG chewable tablet Commonly known as: TUMS - dosed in mg elemental calcium Chew 1 tablet by mouth daily.   cholecalciferol 25 MCG (1000 UNIT) tablet Commonly known as: VITAMIN D3 Take 1,000 Units by mouth daily.   famotidine 20 MG tablet Commonly known as: PEPCID Take 20 mg by mouth at bedtime.   furosemide 20 MG tablet Commonly known as: LASIX Take 1 tablet (20 mg total) by mouth daily. May take an additional 20 mg if needed for swelling or shortness of breath   metoprolol tartrate 25 MG tablet Commonly known as: LOPRESSOR Take 1 tablet (25 mg total) by mouth as needed.   nadolol 20 MG tablet Commonly known as: CORGARD TAKE 1 TABLET BY MOUTH ONCE DAILY   potassium chloride 20 MEQ/15ML (10%) Soln TAKE 15 ML BY MOUTH ONCE DAILY *DILUTE WITH 4-8 OUNCES OF LIQUID* *TAKE WITH FOOD*   PRESERVISION AREDS 2 PO Take 1 capsule by mouth in the morning and at bedtime.   TURMERIC PO Take 500 mg by mouth daily.   Vitamin B Complex Tabs Take 1 tablet by mouth daily.   warfarin 2.5 MG tablet Commonly known as: COUMADIN Take as directed by the anticoagulation clinic. If you are unsure how to take this medication, talk to your nurse or doctor. Original instructions: TAKE 1 TO 2 TABLETS BY MOUTH ONCE DAILY OR AS INSTRUCTED BY COUMADIN CLINIC        Signature:  Coralyn Helling, MD [facility] Pulmonary/Critical Care Pager - [phone] 04/20/2023, 11:33 AM

## 2023-04-28 ENCOUNTER — Ambulatory Visit: Payer: Medicare Other | Attending: Cardiovascular Disease | Admitting: *Deleted

## 2023-04-28 DIAGNOSIS — I4819 Other persistent atrial fibrillation: Secondary | ICD-10-CM | POA: Insufficient documentation

## 2023-04-28 DIAGNOSIS — Z7901 Long term (current) use of anticoagulants: Secondary | ICD-10-CM | POA: Insufficient documentation

## 2023-04-28 LAB — POCT INR: POC INR: 1.3

## 2023-04-28 NOTE — Patient Instructions (Signed)
Description   Take 2.5 tablets of warfarin today and tomorrow take 1.5 tablets.  Then START taking 1 tablet daily except 2 tablets on Mondays and Friday. Repeat INR in 1.5 weeks.  Coumadin Clinic [phone]

## 2023-05-05 ENCOUNTER — Ambulatory Visit: Payer: Medicare Other | Attending: Cardiovascular Disease | Admitting: *Deleted

## 2023-05-05 DIAGNOSIS — I4819 Other persistent atrial fibrillation: Secondary | ICD-10-CM | POA: Insufficient documentation

## 2023-05-05 DIAGNOSIS — Z7901 Long term (current) use of anticoagulants: Secondary | ICD-10-CM | POA: Diagnosis not present

## 2023-05-05 LAB — POCT INR: INR: 1.7 — AB (ref 2.0–3.0)

## 2023-05-05 NOTE — Patient Instructions (Addendum)
Description   Take 2.5 tablets of warfarin today then START taking 1 tablet daily except 2 tablets on Mondays, Wednesday, and Friday. Repeat INR in  2 weeks.  Coumadin Clinic [phone]

## 2023-05-08 DIAGNOSIS — L814 Other melanin hyperpigmentation: Secondary | ICD-10-CM | POA: Diagnosis not present

## 2023-05-08 DIAGNOSIS — L570 Actinic keratosis: Secondary | ICD-10-CM | POA: Diagnosis not present

## 2023-05-08 DIAGNOSIS — L820 Inflamed seborrheic keratosis: Secondary | ICD-10-CM | POA: Diagnosis not present

## 2023-05-08 DIAGNOSIS — L821 Other seborrheic keratosis: Secondary | ICD-10-CM | POA: Diagnosis not present

## 2023-05-08 NOTE — Progress Notes (Signed)
Remote pacemaker transmission.   

## 2023-05-10 ENCOUNTER — Encounter: Payer: Self-pay | Admitting: Family Medicine

## 2023-05-10 ENCOUNTER — Ambulatory Visit (INDEPENDENT_AMBULATORY_CARE_PROVIDER_SITE_OTHER): Payer: Medicare Other | Admitting: Family Medicine

## 2023-05-10 VITALS — BP 124/78 | HR 90 | Temp 97.0°F | Ht 65.0 in | Wt 166.0 lb

## 2023-05-10 DIAGNOSIS — I495 Sick sinus syndrome: Secondary | ICD-10-CM | POA: Diagnosis not present

## 2023-05-10 DIAGNOSIS — I4819 Other persistent atrial fibrillation: Secondary | ICD-10-CM | POA: Diagnosis not present

## 2023-05-10 DIAGNOSIS — R739 Hyperglycemia, unspecified: Secondary | ICD-10-CM | POA: Diagnosis not present

## 2023-05-10 DIAGNOSIS — E785 Hyperlipidemia, unspecified: Secondary | ICD-10-CM | POA: Diagnosis not present

## 2023-05-10 DIAGNOSIS — I10 Essential (primary) hypertension: Secondary | ICD-10-CM

## 2023-05-10 DIAGNOSIS — G250 Essential tremor: Secondary | ICD-10-CM | POA: Diagnosis not present

## 2023-05-10 LAB — COMPREHENSIVE METABOLIC PANEL
ALT: 27 U/L (ref 0–35)
AST: 22 U/L (ref 0–37)
Albumin: 4.4 g/dL (ref 3.5–5.2)
Alkaline Phosphatase: 85 U/L (ref 39–117)
BUN: 20 mg/dL (ref 6–23)
CO2: 32 mEq/L (ref 19–32)
Calcium: 9.7 mg/dL (ref 8.4–10.5)
Chloride: 98 mEq/L (ref 96–112)
Creatinine, Ser: 0.94 mg/dL (ref 0.40–1.20)
GFR: 55.21 mL/min — ABNORMAL LOW (ref >60.00)
Glucose, Bld: 94 mg/dL (ref 70–99)
Potassium: 4.1 mEq/L (ref 3.5–5.1)
Sodium: 139 mEq/L (ref 135–145)
Total Bilirubin: 0.8 mg/dL (ref 0.2–1.2)
Total Protein: 7.3 g/dL (ref 6.0–8.3)

## 2023-05-10 LAB — HEMOGLOBIN A1C: Hgb A1c MFr Bld: 5.9 % (ref 4.6–6.5)

## 2023-05-10 NOTE — Patient Instructions (Addendum)
You are eligible to schedule your annual wellness visit with our nurse specialist Tina.  Please consider scheduling this before you leave today  Please stop by lab before you go If you have mychart- we will send your results within 3 business days of us receiving them.  If you do not have mychart- we will call you about results within 5 business days of us receiving them.  *please also note that you will see labs on mychart as soon as they post. I will later go in and write notes on them- will say "notes from Dr. Jazma Pickel"   Recommended follow up: Return in about 6 months (around 11/10/2023) for followup or sooner if needed.Schedule b4 you leave.  

## 2023-05-10 NOTE — Progress Notes (Signed)
Phone [phone] In person visit   Subjective:   Adriana Hendricks is a 88 y.o. year old very pleasant female patient who presents for/with See problem oriented charting Chief Complaint  Patient presents with   Medical Management of Chronic Issues   Hypertension   ocular migraine   Past Medical History-  Patient Active Problem List   Diagnosis Date Noted   Sinus node dysfunction (HCC)     Priority: High   S/P minimally invasive tricuspid valve repair 06/12/2019    Priority: High   S/P Maze operation for atrial fibrillation 06/12/2019    Priority: High   Rheumatic tricuspid valve regurgitation     Priority: High   Persistent atrial fibrillation (HCC)     Priority: High   Osteopenia 03/13/2017    Priority: High   Hyperglycemia 10/26/2021    Priority: Medium    Bicuspid aortic valve 01/28/2019    Priority: Medium    DOE (dyspnea on exertion) 07/26/2018    Priority: Medium    Ocular migraine 03/13/2017    Priority: Medium    OSA (obstructive sleep apnea) 12/21/2016    Priority: Medium    Lower extremity neuropathy 08/23/2013    Priority: Medium    Hyperlipidemia with target LDL less than 100 01/07/2010    Priority: Medium    GERD with stricture 06/19/2009    Priority: Medium    Essential tremor 11/16/2007    Priority: Medium    Essential hypertension 11/16/2007    Priority: Medium    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    Priority: Low   Tricuspid valve insufficiency     Priority: Low   Long term (current) use of anticoagulants: CHA2DS2Vasc 5, WARFARIN - Rheumatic Vavle Disease 08/28/2018    Priority: Low   IBS (irritable bowel syndrome) 09/12/2016    Priority: Low   History of adenomatous polyp of colon 09/12/2016    Priority: Low   Former smoker 09/12/2016    Priority: Low   Carpal tunnel syndrome 09/04/2013    Priority: Low   Achilles tendon tear 01/17/2011    Priority: Low   ACNE ROSACEA 04/03/2008    Priority: Low   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     calcium carbonate (TUMS - DOSED IN MG ELEMENTAL CALCIUM) 500 MG chewable tablet Chew 1 tablet by mouth daily.      cholecalciferol (VITAMIN D3) 25 MCG (1000 UT) tablet Take 1,000 Units by mouth daily.     famotidine (PEPCID) 20 MG tablet Take 20 mg by mouth at bedtime.     furosemide (LASIX) 20 MG tablet Take 1 tablet (20 mg total) by mouth daily. May take an additional 20 mg if needed for swelling or shortness of breath 110 tablet 3   metoprolol tartrate (LOPRESSOR) 25 MG tablet Take 1 tablet (25 mg total) by mouth as needed. 20 tablet 6   Multiple Vitamins-Minerals (PRESERVISION AREDS 2 PO) Take 1 capsule by mouth in the morning and at bedtime.     nadolol (CORGARD) 20 MG tablet TAKE 1 TABLET BY MOUTH ONCE DAILY 90 tablet 3   potassium chloride 20 MEQ/15ML (10%) SOLN TAKE 15 ML BY MOUTH ONCE DAILY *DILUTE  WITH 4-8 OUNCES OF LIQUID* *TAKE WITH FOOD* 15 mL 10   TURMERIC PO Take 500 mg by mouth daily.     warfarin (COUMADIN) 2.5 MG tablet TAKE 1 TO 2 TABLETS BY MOUTH ONCE DAILY OR AS INSTRUCTED BY COUMADIN CLINIC 120 tablet 5   No current facility-administered medications for this visit.     Objective:  BP 124/78 Comment: most recent home reading yesterday  Pulse 90   Temp (!) 97 F (36.1 C)   Ht 5\' 5"  (1.651 m)   Wt 166 lb (75.3 kg)   LMP  (LMP Unknown)   SpO2 95%   BMI 27.62 kg/m  Gen: NAD, resting comfortably CV: RRR (no obvious a fib) nos rubs or gallops Lungs: CTAB no crackles, wheeze, rhonchi Ext: trace edema Skin: warm, dry     Assessment and Plan   #social update- lost a granddaughter to massive brain bleed recently- processing this  # Atrial  fibrillation-follows with Dr. Herbie Baltimore- last seen 10/19/22 with yearly check S: Rate controlled with nadolol 20 mg daily.  Has metoprolol available if needed- used once about a week ago with stress from granddaughter Anticoagulated with Coumadin with coumadin clinic -a fib maze/tricuspid valve replacement late June 12, 2019 hospitalization A/P: appropriately anticoagulated and rate controlled (appears to be out of a fib since maze) - continue current medicine    #Complete heart block/sinus node dysfunction S: Permanent pacemaker implant December 31, 2021 -Patient prior to this had multiple syncopal episodes arrhythmic in origin A/P:  doing well with pacemaker- continue to monitor and keep cardiology follow up   % Status post minimally invasive tricuspid valve repair (related to rheumatism)-still with moderate to severe tricuspid regurgitation on 03/30/2022 echocardiogram.  Also with thickening of aortic valve without significant narrowing/stenosis- suspect will be checked next cardiology visit   #hypertension and essential tremor S: medication: Lasix 20 mg daily- extra dose as needed for edema- sparingly, nadolol 20 mg Home readings #s: 124/78 yesterday at [facility]  BP Readings from Last 3 Encounters:  05/10/23 124/78  04/20/23 130/70  04/03/23 (!) 144/82  A/P: stable- continue current medicines    #hyperlipidemia-over age 51 does not want to add medicine for primary prevention S: Medication: none  Lab Results  Component Value Date   CHOL 195 11/04/2022   HDL 61.00 11/04/2022   LDLCALC 112 (H) 11/04/2022   LDLDIRECT 128.8 01/17/2011   TRIG 114.0 11/04/2022   CHOLHDL 3 11/04/2022  A/P:  lipids above goal but holding off on medications for primary prevention given her age   # Hyperglycemia/insulin resistance/prediabetes-A1c 5.01 September 2021 S:  Medication: none -not exercising much due to knee pain- had injection Lab Results  Component Value Date   HGBA1C 6.0 11/04/2022   HGBA1C  5.8 05/02/2022   HGBA1C 5.7 (H) 09/07/2021  A/P:  hopefully stable- update a1c today. Continue without meds for now - she feels she could potentially cut down on desserts - sugar could be slightly higher with prior steroid injection  # GERD with history of stricture S:Medication: Famotidine 20 mg down to once a day before bed and helpful still A/P: stable- continue current medicines     #OSA- using her cpap  #compression stockings- wearing for varicosities- gets some pain at night if not - not wearing today so we can examine legs   #Ocular migraine- has increased in recent months to 4-5 days a week and in the past was very sparing- saw Dr. Allena Katz in the past. [facility] vision field  bilateral then slowly jagged lines on right or left of this then gradually works out of field of vision. Episodes last 30-40 minutes. Does not change if closes either eye -with increasing frequency mri brain planned but she later opted out after talking with neurology on 11/04/22 Dr. Allena Katz- was told pacemaker mri compatible  -she has noted continued issues with these though and has had further increase from 6 months ago but does have a finished building across from them that causes a lot of reflection from sun and wonders if its a trigger - wants to hold off on MRI again today- will let us know if changes mind  # Osteopenia-noted 05/12/2022 but roughly stable from 2021-opted to repeat in 2 years- taking vitamin D and half of tums   #Ear clogged sensation on left ear- sleeps on right due to pacemaker so wanted to make sure not ear wax and just eustachian tube - ear exam normal- likely eustachian tube dysfunction- Flonase reasonable- she is using htat right now    Recommended follow up: Return in about 6 months (around 11/10/2023) for followup or sooner if needed.Schedule b4 you leave. Future Appointments  Date Time Provider Department Center  05/15/2023 10:00 AM Rodolph Bong, MD [facility] None  05/19/2023  1:15 PM  [facility] None  07/03/2023  7:05 AM [facility] DEVICE REMOTES [facility]  10/02/2023  7:05 AM [facility] DEVICE REMOTES [facility]  01/01/2024  7:05 AM [facility] DEVICE REMOTES [facility]  04/01/2024  7:05 AM [facility] DEVICE REMOTES [facility]  07/01/2024  7:05 AM [facility] DEVICE REMOTES [facility]  09/30/2024  7:05 AM [facility] DEVICE REMOTES [facility]  12/30/2024  7:05 AM [facility] DEVICE REMOTES [facility]    Lab/Order associations:   ICD-10-CM   1. Persistent atrial fibrillation (HCC)  I48.19     2. Sinus node dysfunction (HCC)  I49.5     3. Essential hypertension  I10 Comp Met (CMET)    4. Essential tremor  G25.0     5. Hyperlipidemia with target LDL less than 100  E78.5     6. Hyperglycemia  R73.9 HgB A1c      No orders of the defined types were placed in this encounter.   Return precautions advised.  Tana Conch, MD

## 2023-05-15 ENCOUNTER — Ambulatory Visit: Payer: Medicare Other | Admitting: Family Medicine

## 2023-05-16 ENCOUNTER — Other Ambulatory Visit: Payer: Self-pay | Admitting: Family Medicine

## 2023-05-19 ENCOUNTER — Ambulatory Visit: Payer: Medicare Other | Attending: Cardiology | Admitting: *Deleted

## 2023-05-19 DIAGNOSIS — I4819 Other persistent atrial fibrillation: Secondary | ICD-10-CM | POA: Insufficient documentation

## 2023-05-19 DIAGNOSIS — Z7901 Long term (current) use of anticoagulants: Secondary | ICD-10-CM | POA: Insufficient documentation

## 2023-05-19 LAB — POCT INR: INR: 1.7 — AB (ref 2.0–3.0)

## 2023-05-19 NOTE — Patient Instructions (Addendum)
Description   Since you took extra the last two days, please CONTINUE taking 1 tablet daily except 2 tablets on Mondays, Wednesday, and Friday. Repeat INR in 2 weeks.  [facility] [phone]

## 2023-06-02 ENCOUNTER — Ambulatory Visit: Payer: Medicare Other | Attending: Internal Medicine | Admitting: *Deleted

## 2023-06-02 DIAGNOSIS — Z7901 Long term (current) use of anticoagulants: Secondary | ICD-10-CM | POA: Diagnosis not present

## 2023-06-02 DIAGNOSIS — I4819 Other persistent atrial fibrillation: Secondary | ICD-10-CM | POA: Diagnosis not present

## 2023-06-02 LAB — POCT INR: INR: 4 — AB (ref 2.0–3.0)

## 2023-06-02 NOTE — Patient Instructions (Addendum)
Description   Do not take any warfarin today then CONTINUE taking 1 tablet daily except 2 tablets on Mondays, Wednesday, and Friday. Repeat INR in 2 weeks. Resume eating 1-2 leafy iceberg salads a week. [facility]madin Clinic [phone]

## 2023-06-16 ENCOUNTER — Ambulatory Visit: Payer: Medicare Other | Attending: Cardiology | Admitting: *Deleted

## 2023-06-16 DIAGNOSIS — I4819 Other persistent atrial fibrillation: Secondary | ICD-10-CM | POA: Insufficient documentation

## 2023-06-16 DIAGNOSIS — Z7901 Long term (current) use of anticoagulants: Secondary | ICD-10-CM | POA: Diagnosis not present

## 2023-06-16 LAB — POCT INR: INR: 1.9 — AB (ref 2.0–3.0)

## 2023-06-16 NOTE — Patient Instructions (Addendum)
Description   Today take 2.5 tablets of warfarin then CONTINUE taking 1 tablet daily except 2 tablets on Mondays, Wednesday, and Friday. Repeat INR in 3 weeks. Resume eating 1-2 leafy iceberg salads a week. [facility] [phone]

## 2023-07-03 ENCOUNTER — Ambulatory Visit (INDEPENDENT_AMBULATORY_CARE_PROVIDER_SITE_OTHER): Payer: Medicare Other

## 2023-07-03 DIAGNOSIS — I442 Atrioventricular block, complete: Secondary | ICD-10-CM | POA: Diagnosis not present

## 2023-07-03 DIAGNOSIS — L814 Other melanin hyperpigmentation: Secondary | ICD-10-CM | POA: Diagnosis not present

## 2023-07-03 DIAGNOSIS — L570 Actinic keratosis: Secondary | ICD-10-CM | POA: Diagnosis not present

## 2023-07-03 DIAGNOSIS — L821 Other seborrheic keratosis: Secondary | ICD-10-CM | POA: Diagnosis not present

## 2023-07-03 LAB — CUP PACEART REMOTE DEVICE CHECK
Battery Voltage: 90
Date Time Interrogation Session: 20240708101409
Implantable Lead Connection Status: 753985
Implantable Lead Connection Status: 753985
Implantable Lead Implant Date: 20230106
Implantable Lead Implant Date: 20230106
Implantable Lead Location: 753858
Implantable Lead Location: 753859
Implantable Lead Model: 377171
Implantable Lead Model: 377171
Implantable Lead Serial Number: 8000603602
Implantable Lead Serial Number: 8000650012
Implantable Pulse Generator Implant Date: 20230106
Pulse Gen Model: 407145
Pulse Gen Serial Number: 70291848

## 2023-07-07 ENCOUNTER — Ambulatory Visit: Payer: Medicare Other | Attending: Cardiology

## 2023-07-07 DIAGNOSIS — Z7901 Long term (current) use of anticoagulants: Secondary | ICD-10-CM | POA: Insufficient documentation

## 2023-07-07 DIAGNOSIS — I4819 Other persistent atrial fibrillation: Secondary | ICD-10-CM | POA: Diagnosis not present

## 2023-07-07 LAB — POCT INR: INR: 3 (ref 2.0–3.0)

## 2023-07-07 NOTE — Patient Instructions (Signed)
CONTINUE taking 1 tablet daily except 2 tablets on Mondays, Wednesday, and Friday. Repeat INR in 6 weeks. Resume eating 1-2 leafy iceberg salads a week. [facility]madin Clinic [phone]

## 2023-07-18 NOTE — Progress Notes (Signed)
Remote pacemaker transmission.   

## 2023-08-14 ENCOUNTER — Ambulatory Visit: Payer: Medicare Other | Attending: Cardiology | Admitting: *Deleted

## 2023-08-14 DIAGNOSIS — I4819 Other persistent atrial fibrillation: Secondary | ICD-10-CM

## 2023-08-14 DIAGNOSIS — Z7901 Long term (current) use of anticoagulants: Secondary | ICD-10-CM

## 2023-08-14 LAB — POCT INR: INR: 3.9 — AB (ref 2.0–3.0)

## 2023-08-14 NOTE — Patient Instructions (Signed)
Description   Do not take any warfarin today then CONTINUE taking 1 tablet daily except 2 tablets on Mondays, Wednesday, and Friday. Repeat INR in 6 weeks. Resume eating 1-2 leafy iceberg salads a week. Coumadin Clinic [phone]

## 2023-08-18 ENCOUNTER — Other Ambulatory Visit: Payer: Self-pay | Admitting: Medical Genetics

## 2023-08-18 DIAGNOSIS — Z006 Encounter for examination for normal comparison and control in clinical research program: Secondary | ICD-10-CM

## 2023-08-30 ENCOUNTER — Encounter (HOSPITAL_BASED_OUTPATIENT_CLINIC_OR_DEPARTMENT_OTHER): Payer: Self-pay | Admitting: Pulmonary Disease

## 2023-08-30 ENCOUNTER — Ambulatory Visit (INDEPENDENT_AMBULATORY_CARE_PROVIDER_SITE_OTHER): Payer: Medicare Other | Admitting: Pulmonary Disease

## 2023-08-30 VITALS — BP 118/78 | HR 68 | Resp 16 | Ht 65.0 in | Wt 167.4 lb

## 2023-08-30 DIAGNOSIS — G4733 Obstructive sleep apnea (adult) (pediatric): Secondary | ICD-10-CM | POA: Diagnosis not present

## 2023-08-30 NOTE — Progress Notes (Signed)
[facility]  Chief Complaint  Patient presents with   Follow-up    OSA on CPAP. Doing very well. Has new CPAP.     Constitutional:  BP 118/78   Pulse 68   Resp 16   Ht 5\' 5"  (1.651 m)   Wt 167 lb 6.4 oz (75.9 kg)   LMP  (LMP Unknown)   SpO2 96%   BMI 27.86 kg/m   Past Medical History:  TR s/p repair June 2020, A fib s/p MAZE, Neuropathy, HTN, HH, GERD, Essential tremor, Esophageal stenosis, Diverticulosis, Colon polyps, Collagenous colitis, OA  Past Surgical History:  She  has a past surgical history that includes Appendectomy; Tonsillectomy; Cataract extraction; Moles removed; Excision Morton's neuroma; Mouth surgery; Intraocular lens implant, secondary; Carpal tunnel release; Rotator cuff repair; 48 hr Holter Monitor (07/2018); NUCLEAR (07/2018); Cardioversion (N/A, 10/02/2018); TEE without cardioversion (N/A, 01/16/2019); RIGHT/LEFT HEART CATH AND CORONARY ANGIOGRAPHY (N/A, 01/16/2019); Eye surgery; Cardiac catheterization; Minimally invasive tricuspid valve repair (Right, 06/12/2019); Minimally invasive maze procedure (N/A, 06/12/2019); TEE without cardioversion (N/A, 06/12/2019); IR THORACENTESIS ASP PLEURAL SPACE W/IMG GUIDE (07/10/2019); IR THORACENTESIS ASP PLEURAL SPACE W/IMG GUIDE (07/10/2019); IR THORACENTESIS ASP PLEURAL SPACE W/IMG GUIDE (07/26/2019); transthoracic echocardiogram (08/05/2019); PACEMAKER IMPLANT (N/A, 12/31/2021); and transthoracic echocardiogram (03/30/2022).  Brief Summary:  Adriana Hendricks is a 88 y.o. female with obstructive sleep apnea.      Subjective:   She got her new machine.  Opted to stick with Airsense 10 instead of 11.  Working well, but she has noticed more trouble with mouth dryness since getting her new machine.  She uses a nasal cushion mask.  Physical Exam:   Appearance - well kempt   ENMT - no sinus tenderness, no oral exudate, no LAN, Mallampati 3 airway, no stridor  Respiratory -  equal breath sounds bilaterally, no wheezing or rales  CV - s1s2 regular rate and rhythm, 2/6  Ext - no clubbing, no edema  Skin - no rashes  Psych - normal mood and affect    Sleep Tests:  HST 12/14/16 >> AHI 19.5, SaO2 low 78% Auto CPAP 06/01/23 to 08/29/23 >> used on 90 of 90 nights with average 8 hrs 29 min.  Average AHI 1.1 with median CPAP 7 and 95 th percentile CPAP 8 cm H2O  Cardiac Tests:  Echo 03/30/22 >> EF 55 to 60%, mild LVH, mild MR, s/p TVR, mild AS/AR, aortic root 42 mm  Social History:  She  reports that she quit smoking about 55 years ago. Her smoking use included cigarettes. She started smoking about 68 years ago. She has a 26.5 pack-year smoking history. She has never used smokeless tobacco. She reports current alcohol use of about 2.0 standard drinks of alcohol per week. She reports that she does not use drugs.  Family History:  Her family history includes Barrett's esophagus in her son; Heart failure in her father; Other in her granddaughter and mother; Rectal cancer in her father.     Assessment/Plan:   Obstructive sleep apnea. - she is compliant with therapy and reports benefit from CPAP - uses [employer] for her DME - current CPAP ordered April 2024 - will arrange for a chin strap to see if this helps with mouth dryness - continue auto CPAP 5 to 8 cm H2O  Persistent atrial fibrillation, rheumatic valve disease, Complete Heart block s/p PM. - followed by Dr. Bryan Lemma and Dr. Steffanie Dunn with cardiology  Time Spent Involved in Patient Care  on Day of Examination:  16 minutes  Follow up:   Patient Instructions  Will arrange for a chin strap for your CPAP mask.  Follow up in 1 year.  Medication List:   Allergies as of 08/30/2023       Reactions   Levofloxacin Other (See Comments)   Developed tendon pain. (Has a history of a Achilles tendon tear)   Potassium Chloride Other (See Comments)   Can't take tables   Clarithromycin Nausea Only    Erythromycin Base Nausea And Vomiting   Metronidazole Rash   Penicillins Rash, Other (See Comments)   Did it involve swelling of the face/tongue/throat, SOB, or low BP? No Did it involve sudden or severe rash/hives, skin peeling, or any reaction on the inside of your mouth or nose? No Did you need to seek medical attention at a hospital or doctor's office? No When did it last happen?  as a child If all above answers are "NO", may proceed with cephalosporin use.        Medication List        Accurate as of August 30, 2023  9:44 AM. If you have any questions, ask your nurse or doctor.          amoxicillin 500 MG capsule Commonly known as: AMOXIL TAKE 4 CAPSULES (2000 MG) BY MOUTH FOR ONE DOSE 1 HOUR PRIOR TO DENTAL PROCEDURE.   calcium carbonate 500 MG chewable tablet Commonly known as: TUMS - dosed in mg elemental calcium Chew 1 tablet by mouth daily.   cholecalciferol 25 MCG (1000 UNIT) tablet Commonly known as: VITAMIN D3 Take 1,000 Units by mouth daily.   famotidine 20 MG tablet Commonly known as: PEPCID Take 20 mg by mouth at bedtime.   furosemide 20 MG tablet Commonly known as: LASIX Take 1 tablet (20 mg total) by mouth daily. May take an additional 20 mg if needed for swelling or shortness of breath   metoprolol tartrate 25 MG tablet Commonly known as: LOPRESSOR Take 1 tablet (25 mg total) by mouth as needed.   nadolol 20 MG tablet Commonly known as: CORGARD TAKE 1 TABLET BY MOUTH ONCE DAILY   potassium chloride 20 MEQ/15ML (10%) Soln TAKE 15 ML BY MOUTH ONCE DAILY *DILUTE WITH 4-8 OUNCES OF LIQUID* *TAKE WITH FOOD*   PRESERVISION AREDS 2 PO Take 1 capsule by mouth in the morning and at bedtime.   TURMERIC PO Take 500 mg by mouth daily.   Vitamin B Complex Tabs Take 1 tablet by mouth daily.   warfarin 2.5 MG tablet Commonly known as: COUMADIN Take as directed by the anticoagulation clinic. If you are unsure how to take this medication, talk to  your nurse or doctor. Original instructions: TAKE 1 TO 2 TABLETS BY MOUTH ONCE DAILY OR AS INSTRUCTED BY COUMADIN CLINIC        Signature:  Coralyn Helling, MD [facility] Pager - [phone] 08/30/2023, 9:44 AM

## 2023-08-30 NOTE — Patient Instructions (Signed)
Will arrange for a chin strap for your CPAP mask.  Follow up in 1 year.

## 2023-09-11 ENCOUNTER — Ambulatory Visit: Payer: Medicare Other | Attending: Cardiology | Admitting: *Deleted

## 2023-09-11 DIAGNOSIS — I4819 Other persistent atrial fibrillation: Secondary | ICD-10-CM | POA: Insufficient documentation

## 2023-09-11 DIAGNOSIS — Z7901 Long term (current) use of anticoagulants: Secondary | ICD-10-CM | POA: Diagnosis not present

## 2023-09-11 LAB — POCT INR: INR: 2.7 (ref 2.0–3.0)

## 2023-09-11 NOTE — Patient Instructions (Signed)
Description   CONTINUE taking 1 tablet daily except 2 tablets on Mondays, Wednesday, and Friday. Recheck INR in 6 weeks. Resume eating 1-2 leafy iceberg salads a week. Coumadin Clinic [phone]

## 2023-09-26 ENCOUNTER — Other Ambulatory Visit: Payer: Self-pay | Admitting: Cardiology

## 2023-10-02 ENCOUNTER — Ambulatory Visit (INDEPENDENT_AMBULATORY_CARE_PROVIDER_SITE_OTHER): Payer: Medicare Other

## 2023-10-02 DIAGNOSIS — I442 Atrioventricular block, complete: Secondary | ICD-10-CM | POA: Diagnosis not present

## 2023-10-02 DIAGNOSIS — I4819 Other persistent atrial fibrillation: Secondary | ICD-10-CM

## 2023-10-02 LAB — CUP PACEART REMOTE DEVICE CHECK
Battery Voltage: 85
Date Time Interrogation Session: 20241007125608
Implantable Lead Connection Status: 753985
Implantable Lead Connection Status: 753985
Implantable Lead Implant Date: 20230106
Implantable Lead Implant Date: 20230106
Implantable Lead Location: 753858
Implantable Lead Location: 753859
Implantable Lead Model: 377171
Implantable Lead Model: 377171
Implantable Lead Serial Number: 8000603602
Implantable Lead Serial Number: 8000650012
Implantable Pulse Generator Implant Date: 20230106
Pulse Gen Model: 407145
Pulse Gen Serial Number: 70291848

## 2023-10-03 ENCOUNTER — Other Ambulatory Visit: Payer: Self-pay

## 2023-10-03 MED ORDER — POTASSIUM CHLORIDE 20 MEQ/15ML (10%) PO SOLN: ORAL | 0 refills | Status: DC

## 2023-10-13 NOTE — Progress Notes (Signed)
Remote pacemaker transmission.   

## 2023-10-22 NOTE — Progress Notes (Unsigned)
Cardiology Office Note:  .   Date:  10/23/2023  ID:  Adriana Hendricks, DOB Aug 09, 1935, MRN [id number] PCP: Shelva Majestic, MD  [facility] Providers Cardiologist:  Dr.Harding  Electrophysiologist: Dr. Elberta Fortis }   History of Present Illness: .   Adriana Hendricks is a 88 y.o. female with history of status post tricuspid valve repair due to severe TR, complete heart block status post pacemaker in situ, rheumatic mitral regurgitation with stable moderate MR, repeat echo will be necessary on follow-up today, bicuspid aortic valve with only mild stenosis, hypertension, persistent atrial fibrillation on warfarin status post Maze procedure with left atrial clipping, on as needed Corgard for rate control.  Last seen by Dr. Herbie Baltimore on 10/19/2022 and was stable from a cardiac standpoint.  The patient is reluctant to move forward with any new surgeries unless she is severely symptomatic.  She is here for 1 year follow-up.  She offers no cardiac complaints today.  She is a resident of [place] and is enjoying it very much.  She is very social there.  She does have some concerns about her pacemaker, her bra strap keeps getting caught on it.  She was wondering if there was something within the pacemaker device that is sticking out causing her to have this happen.  She also complains of lower extremity varicosities and does wear compression hose.  Her skin is very sensitive to touch  and the pretibial  location.  Otherwise she is doing well.  Labs are completed by PCP.  ROS: As above otherwise negative.  Studies Reviewed: Marland Kitchen   EKG Interpretation Date/Time:  Monday October 23 2023 10:31:36 EDT Ventricular Rate:  61 PR Interval:  270 QRS Duration:  120 QT Interval:  454 QTC Calculation: 457 R Axis:   77  Text Interpretation: Atrial-paced rhythm with prolonged AV conduction Non-specific intra-ventricular conduction delay Nonspecific ST abnormality When compared with ECG of 31-Dec-2021 18:45, QRS  duration has increased Criteria for Septal infarct are no longer Present T wave inversion no longer evident in Anterior leads Confirmed by Joni Reining [phone]) on 10/23/2023 10:52:52 AM    Echocardiogram 03/30/2022 1. Left ventricular ejection fraction, by estimation, is 55 to 60%. The  left ventricle has normal function. The left ventricle has no regional  wall motion abnormalities. There is mild left ventricular hypertrophy.  Left ventricular diastolic parameters  are indeterminate. The average left ventricular global longitudinal strain  is -21.3 %. The global longitudinal strain is normal.   2. Right ventricular systolic function is normal. The right ventricular  size is normal. There is moderately elevated pulmonary artery systolic  pressure.   3. Left atrial size was mildly dilated.   4. Right atrial size was moderately dilated.   5. The mitral valve is abnormal. Mild mitral valve regurgitation. No  evidence of mitral stenosis.   6. The tricuspid valve is has been repaired/replaced. Tricuspid valve  regurgitation is moderate to severe.   7. Limited view functionally bicuspid with fused left and right cusps .  The aortic valve is abnormal. There is moderate calcification of the  aortic valve. There is moderate thickening of the aortic valve. Aortic  valve regurgitation is mild. Mild aortic  valve stenosis.   8. Aortic dilatation noted. There is moderate dilatation of the aortic  root, measuring 42 mm.   9. The inferior vena cava is normal in size with greater than 50%  respiratory variability, suggesting right atrial pressure of 3 mmHg.    EKG  Interpretation Date/Time:  Monday October 23 2023 10:31:36 EDT Ventricular Rate:  61 PR Interval:  270 QRS Duration:  120 QT Interval:  454 QTC Calculation: 457 R Axis:   77  Text Interpretation: Atrial-paced rhythm with prolonged AV conduction Non-specific intra-ventricular conduction delay Nonspecific ST abnormality When compared  with ECG of 31-Dec-2021 18:45, QRS duration has increased Criteria for Septal infarct are no longer Present T wave inversion no longer evident in Anterior leads Confirmed by Joni Reining [phone]) on 10/23/2023 10:52:52 AM    Physical Exam:   VS:  BP 132/84   Pulse 61   Ht 5' 5.25" (1.657 m)   Wt 166 lb 3.2 oz (75.4 kg)   LMP  (LMP Unknown)   SpO2 97%   BMI 27.45 kg/m    Wt Readings from Last 3 Encounters:  10/23/23 166 lb 3.2 oz (75.4 kg)  08/30/23 167 lb 6.4 oz (75.9 kg)  05/10/23 166 lb (75.3 kg)    GEN: Well nourished, well developed in no acute distress NECK: No JVD; No carotid bruits CARDIAC: IRRR, 2/6 systolic murmur heard best at the left sternal border, soft in the apex.  No rubs, gallops RESPIRATORY:  Clear to auscultation without rales, wheezing or rhonchi  ABDOMEN: Soft, non-tender, non-distended EXTREMITIES:  No edema; No deformity varicosities are noted.  Skin is tender to touch.  ASSESSMENT AND PLAN: .    Permanent atrial fibrillation: EKG reveals atrial paced.  Heart rate is well-controlled.  She continues on Coumadin therapy and will see them today after our office visit.  Continue current medication management with nadolol 20 mg daily and metoprolol 25 mg as needed rapid heart rhythm.  She is rarely used this and does require refill as her last refill was in 2022.  This is provided.  2.  Pacemaker in situ: Secondary to complete heart block.  Followed by Dr. Elberta Fortis with remote checks per protocol.  3.  Status post tricuspid valve repair: In the setting of severe tricuspid regurgitation.  Serial echoes are recommended every 1 to 2 years.  4.  Rheumatic mitral valve regurgitation: She is asymptomatic currently.  Patient will have follow-up echo next year.  5.  Hypertension: Excellent control of blood pressure today.  No changes in her medication regimen.           Signed, Bettey Mare. Liborio Nixon, ANP, AACC

## 2023-10-23 ENCOUNTER — Ambulatory Visit: Payer: Medicare Other | Attending: Adult Health | Admitting: Adult Health

## 2023-10-23 ENCOUNTER — Encounter: Payer: Self-pay | Admitting: Adult Health

## 2023-10-23 ENCOUNTER — Telehealth: Payer: Self-pay | Admitting: Adult Health

## 2023-10-23 ENCOUNTER — Ambulatory Visit: Payer: Medicare Other | Admitting: *Deleted

## 2023-10-23 VITALS — BP 132/84 | HR 61 | Ht 65.25 in | Wt 166.2 lb

## 2023-10-23 DIAGNOSIS — Q2381 Bicuspid aortic valve: Secondary | ICD-10-CM

## 2023-10-23 DIAGNOSIS — I4819 Other persistent atrial fibrillation: Secondary | ICD-10-CM

## 2023-10-23 DIAGNOSIS — Z7901 Long term (current) use of anticoagulants: Secondary | ICD-10-CM

## 2023-10-23 DIAGNOSIS — I071 Rheumatic tricuspid insufficiency: Secondary | ICD-10-CM | POA: Diagnosis not present

## 2023-10-23 DIAGNOSIS — I10 Essential (primary) hypertension: Secondary | ICD-10-CM | POA: Diagnosis not present

## 2023-10-23 DIAGNOSIS — I442 Atrioventricular block, complete: Secondary | ICD-10-CM

## 2023-10-23 LAB — POCT INR: INR: 2.4 (ref 2.0–3.0)

## 2023-10-23 MED ORDER — METOPROLOL TARTRATE 25 MG PO TABS: 25.0000 mg | ORAL_TABLET | ORAL | 6 refills | Status: AC | PRN

## 2023-10-23 NOTE — Telephone Encounter (Signed)
Pharmacy would like to know how many times daily she can take as needed.

## 2023-10-23 NOTE — Patient Instructions (Signed)
Medication Instructions:  No Changes *If you need a refill on your cardiac medications before your next appointment, please call your pharmacy*   Lab Work: No Labs If you have labs (blood work) drawn today and your tests are completely normal, you will receive your results only by: [place] (if you have MyChart) OR A paper copy in the mail If you have any lab test that is abnormal or we need to change your treatment, we will call you to review the results.   Testing/Procedures: No Testing   Follow-Up: At [facility], you and your health needs are our priority.  As part of our continuing mission to provide you with exceptional heart care, we have created designated Provider Care Teams.  These Care Teams include your primary Cardiologist (physician) and Advanced Practice Providers (APPs -  Physician Assistants and Nurse Practitioners) who all work together to provide you with the care you need, when you need it.  We recommend signing up for the patient portal called "MyChart".  Sign up information is provided on this After Visit Summary.  MyChart is used to connect with patients for Virtual Visits (Telemedicine).  Patients are able to view lab/test results, encounter notes, upcoming appointments, etc.  Non-urgent messages can be sent to your provider as well.   To learn more about what you can do with MyChart, go to [email or web address].    Your next appointment:   1 year(s)  Provider:   Glenetta Hew, MD

## 2023-10-23 NOTE — Patient Instructions (Addendum)
Description   Continue taking 1 tablet daily except 2 tablets on Mondays, Wednesday, and Friday. Recheck INR in 6 weeks. Continue eating 1-2 leafy iceberg salads a week. Coumadin Clinic [phone]

## 2023-10-23 NOTE — Telephone Encounter (Signed)
Called the pharmacy back up pharmacy and gave instructions as every 8 hours as needed.

## 2023-10-23 NOTE — Telephone Encounter (Signed)
Attempted to call pharmacy and they were closed for the remainder of the day. Will try back tomorrow.

## 2023-10-23 NOTE — Telephone Encounter (Signed)
Pt c/o medication issue:  1. Name of Medication:   metoprolol tartrate (LOPRESSOR) 25 MG tablet    2. How are you currently taking this medication (dosage and times per day)? Take 1 tablet (25 mg total) by mouth as needed.   3. Are you having a reaction (difficulty breathing--STAT)? no  4. What is your medication issue? Pharmacy needs clarification on how many times daily

## 2023-11-13 ENCOUNTER — Ambulatory Visit (INDEPENDENT_AMBULATORY_CARE_PROVIDER_SITE_OTHER): Payer: Medicare Other | Admitting: Family Medicine

## 2023-11-13 ENCOUNTER — Encounter: Payer: Self-pay | Admitting: Family Medicine

## 2023-11-13 VITALS — BP 124/76 | HR 88 | Temp 97.7°F | Ht 65.25 in | Wt 164.4 lb

## 2023-11-13 DIAGNOSIS — I4819 Other persistent atrial fibrillation: Secondary | ICD-10-CM

## 2023-11-13 DIAGNOSIS — R739 Hyperglycemia, unspecified: Secondary | ICD-10-CM

## 2023-11-13 DIAGNOSIS — E785 Hyperlipidemia, unspecified: Secondary | ICD-10-CM | POA: Diagnosis not present

## 2023-11-13 DIAGNOSIS — Z131 Encounter for screening for diabetes mellitus: Secondary | ICD-10-CM

## 2023-11-13 DIAGNOSIS — I10 Essential (primary) hypertension: Secondary | ICD-10-CM | POA: Diagnosis not present

## 2023-11-13 LAB — CBC WITH DIFFERENTIAL/PLATELET
Basophils Absolute: 0 10*3/uL (ref 0.0–0.1)
Basophils Relative: 0.4 % (ref 0.0–3.0)
Eosinophils Absolute: 0.1 10*3/uL (ref 0.0–0.7)
Eosinophils Relative: 1 % (ref 0.0–5.0)
HCT: 42.1 % (ref 36.0–46.0)
Hemoglobin: 14.1 g/dL (ref 12.0–15.0)
Lymphocytes Relative: 13 % (ref 12.0–46.0)
Lymphs Abs: 0.9 10*3/uL (ref 0.7–4.0)
MCHC: 33.6 g/dL (ref 30.0–36.0)
MCV: 96.1 fL (ref 78.0–100.0)
Monocytes Absolute: 0.5 10*3/uL (ref 0.1–1.0)
Monocytes Relative: 7.8 % (ref 3.0–12.0)
Neutro Abs: 5.1 10*3/uL (ref 1.4–7.7)
Neutrophils Relative %: 77.8 % — ABNORMAL HIGH (ref 43.0–77.0)
Platelets: 156 10*3/uL (ref 150.0–400.0)
RBC: 4.39 Mil/uL (ref 3.87–5.11)
RDW: 13.4 % (ref 11.5–15.5)
WBC: 6.6 10*3/uL (ref 4.0–10.5)

## 2023-11-13 LAB — LIPID PANEL
Cholesterol: 214 mg/dL — ABNORMAL HIGH (ref 0–200)
HDL: 68.2 mg/dL (ref >39.00)
LDL Cholesterol: 125 mg/dL — ABNORMAL HIGH (ref 0–99)
NonHDL: 146.24
Total CHOL/HDL Ratio: 3
Triglycerides: 107 mg/dL (ref 0.0–149.0)
VLDL: 21.4 mg/dL (ref 0.0–40.0)

## 2023-11-13 LAB — COMPREHENSIVE METABOLIC PANEL
ALT: 20 U/L (ref 0–35)
AST: 24 U/L (ref 0–37)
Albumin: 4.6 g/dL (ref 3.5–5.2)
Alkaline Phosphatase: 90 U/L (ref 39–117)
BUN: 16 mg/dL (ref 6–23)
CO2: 27 meq/L (ref 19–32)
Calcium: 9.5 mg/dL (ref 8.4–10.5)
Chloride: 99 meq/L (ref 96–112)
Creatinine, Ser: 0.92 mg/dL (ref 0.40–1.20)
GFR: 56.45 mL/min — ABNORMAL LOW (ref >60.00)
Glucose, Bld: 98 mg/dL (ref 70–99)
Potassium: 4 meq/L (ref 3.5–5.1)
Sodium: 138 meq/L (ref 135–145)
Total Bilirubin: 0.9 mg/dL (ref 0.2–1.2)
Total Protein: 7.6 g/dL (ref 6.0–8.3)

## 2023-11-13 LAB — HEMOGLOBIN A1C: Hgb A1c MFr Bld: 6 % (ref 4.6–6.5)

## 2023-11-13 NOTE — Progress Notes (Addendum)
Phone [phone] In person visit   Subjective:   Adriana Hendricks is a 88 y.o. year old very pleasant female patient who presents for/with See problem oriented charting Chief Complaint  Patient presents with   Medical Management of Chronic Issues   Hyperlipidemia   Hypertension   Past Medical History-  Patient Active Problem List   Diagnosis Date Noted   Sinus node dysfunction (HCC)     Priority: High   S/P minimally invasive tricuspid valve repair 06/12/2019    Priority: High   S/P Maze operation for atrial fibrillation 06/12/2019    Priority: High   Rheumatic tricuspid valve regurgitation     Priority: High   Persistent atrial fibrillation (HCC)     Priority: High   Osteopenia 03/13/2017    Priority: High   Hyperglycemia 10/26/2021    Priority: Medium    Bicuspid aortic valve 01/28/2019    Priority: Medium    DOE (dyspnea on exertion) 07/26/2018    Priority: Medium    Ocular migraine 03/13/2017    Priority: Medium    OSA (obstructive sleep apnea) 12/21/2016    Priority: Medium    Lower extremity neuropathy 08/23/2013    Priority: Medium    Hyperlipidemia with target LDL less than 100 01/07/2010    Priority: Medium    GERD with stricture 06/19/2009    Priority: Medium    Essential tremor 11/16/2007    Priority: Medium    Essential hypertension 11/16/2007    Priority: Medium    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    Priority: Low   Tricuspid valve insufficiency     Priority: Low   Long term (current) use of anticoagulants: CHA2DS2Vasc 5, WARFARIN - Rheumatic Vavle Disease 08/28/2018    Priority: Low   IBS (irritable bowel syndrome) 09/12/2016    Priority: Low   History of adenomatous polyp of colon 09/12/2016    Priority: Low   Former smoker 09/12/2016    Priority: Low   Carpal tunnel syndrome 09/04/2013    Priority: Low   Achilles tendon tear 01/17/2011    Priority: Low   ACNE ROSACEA 04/03/2008    Priority: Low   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     calcium carbonate (TUMS - DOSED IN MG ELEMENTAL CALCIUM) 500 MG chewable tablet Chew 1 tablet by mouth daily.      cholecalciferol (VITAMIN D3) 25 MCG (1000 UT) tablet Take 1,000 Units by mouth daily.     famotidine (PEPCID) 20 MG tablet Take 20 mg by mouth at bedtime.     furosemide (LASIX) 20 MG tablet TAKE 1 TABLET (20 MG TOTAL) BY MOUTH ONCE DAILY. MAY TAKE AN ADDITIONAL 20 MG IF NEEDED FOR SWELLING OR SHORTNESS OF BREATH. 110 tablet 0   metoprolol tartrate (LOPRESSOR) 25 MG tablet Take 1 tablet (25 mg total) by mouth as needed. 20 tablet 6   Multiple Vitamins-Minerals (PRESERVISION AREDS 2 PO) Take 1 capsule by mouth in the morning and at bedtime.     nadolol (CORGARD) 20 MG tablet TAKE 1 TABLET BY MOUTH ONCE DAILY 90 tablet 3   potassium chloride 20 MEQ/15ML (10%) SOLN TAKE 15 ML BY MOUTH ONCE DAILY *DILUTE  WITH 4-8 OUNCES OF LIQUID* *TAKE WITH FOOD* 15 mL 0   TURMERIC PO Take 500 mg by mouth daily.     warfarin (COUMADIN) 2.5 MG tablet TAKE 1 TO 2 TABLETS BY MOUTH ONCE DAILY OR AS INSTRUCTED BY COUMADIN CLINIC 120 tablet 5   amoxicillin (AMOXIL) 500 MG capsule TAKE 4 CAPSULES (2000 MG) BY MOUTH FOR ONE DOSE 1 HOUR PRIOR TO DENTAL PROCEDURE. (Patient not taking: Reported on 11/13/2023) 4 capsule 2   No current facility-administered medications for this visit.     Objective:  BP 124/76   Pulse 88   Temp 97.7 F (36.5 C)   Ht 5' 5.25" (1.657 m)   Wt 164 lb 6.4 oz (74.6 kg)   LMP  (LMP Unknown)   SpO2 98%   BMI 27.15 kg/m  Gen: NAD, resting comfortably CV: RRR no murmurs rubs or gallops Lungs: CTAB no crackles, wheeze, rhonchi Abdomen: soft/nontender/nondistended/normal  bowel sounds. No rebound or guarding.  Ext: trace edema- sensitive to touch Skin: warm, dry     Assessment and Plan   # Social update-still dealing with the loss of her granddaughter to brain bleed- challenging going into holidays- family very supportive of each other   # Atrial fibrillation-follows with Dr. Herbie [place] S: Rate controlled with nadolol 20 mg daily.  Has metoprolol available if needed Anticoagulated with Coumadin- reports blood in past if INR is too high- does fine if in normal range- will check CBC today -a fib maze/tricuspid valve replacement late June 12, 2019 hospitalization-has appeared to stay out of A-fib since that time A/P: appropriately anticoagulated and rate controlled (though appears to stay in sinus)- continue current medicine   #Complete heart block/sinus node dysfunction S: Permanent pacemaker implant December 31, 2021 -Patient prior to this had multiple syncopal episodes arrhythmic in origin -no recent syncopal episodes A/P: appropriately treated with pacemaker- continue to monitor and cardiology follow up    % Status post minimally invasive tricuspid valve repair (related to rheumatism)-still with moderate to severe tricuspid regurgitation on 03/30/2022 echocardiogram.  Also with thickening of aortic valve without significant narrowing/stenosis- on last note 10/23/23 they mentioned at least every 2 year repeat   #hypertension and essential tremor S: medication: Lasix 20 mg daily- extra dose as needed for edema, nadolol 20 mg -feels tremor is worsening and sometimes hard to do finger pricks BP Readings from Last 3 Encounters:  11/13/23 124/76  10/23/23 132/84  08/30/23 118/78  A/P: blood pressure stable- continue current medicines and reports similar at home - tremor slightly worse- mentioned trying higher dose of nadolol but she prefers to hold steady for now    #hyperlipidemia-over age 22 does not want to add medicine for primary prevention S:  Medication:none  Lab Results  Component Value Date   CHOL 195 11/04/2022   HDL 61.00 11/04/2022   LDLCALC 112 (H) 11/04/2022   LDLDIRECT 128.8 01/17/2011   TRIG 114.0 11/04/2022   CHOLHDL 3 11/04/2022  A/P: prefers to remain off medications- update lipid panel today   # Hyperglycemia/insulin resistance/prediabetes-A1c 5.01 September 2021 S:  Medication: none Exercise and diet- exercise 3x a week plus waalking Lab Results  Component Value Date   HGBA1C 5.9 05/10/2023   HGBA1C 6.0 11/04/2022   HGBA1C 5.8 05/02/2022  A/P: hopefully stable- update a1c today. Continue without meds for now   # GERD with history of stricture S:Medication: Famotidine 20 mg down to once a day before bed and helpful still  A/P: doing reasonably well- continue current  medications     #OSA- using her CPAP through Dr. Craige Cotta  #compression stockings- wearing for varicosities- gets some pain at night if not    #Ocular migraine- stable - sometimes annoying as wakes up with them. Has wanted to hold off on MRI- may chat with Dr. Allena Katz again  #osteopenia- due next visit  #venous insufficiency- some pain at night- offered vascular consult- she declines  Recommended follow up: Return in about 6 months (around 05/12/2024) for followup or sooner if needed.Schedule b4 you leave. Future Appointments  Date Time Provider Department Center  11/15/2023 10:00 AM [facility] None  12/04/2023  2:00 PM [facility] None  01/01/2024  7:05 AM [facility] DEVICE REMOTES [facility]  04/01/2024  7:05 AM [facility] DEVICE REMOTES [facility]  07/01/2024  7:05 AM [facility] DEVICE REMOTES [facility]  09/30/2024  7:05 AM [facility] DEVICE REMOTES [facility]  12/30/2024  7:05 AM [facility] DEVICE REMOTES [facility]    Lab/Order associations:   ICD-10-CM   1. Persistent atrial fibrillation (HCC)  I48.19     2. Essential  hypertension  I10     3. Hyperlipidemia with target LDL less than 100  E78.5     4. Hyperglycemia  R73.9     5. Screening for diabetes mellitus  Z13.1       No orders of the defined types were placed in this encounter.   Return precautions advised.  Tana Conch, MD

## 2023-11-13 NOTE — Patient Instructions (Addendum)
You are eligible to schedule your annual wellness visit with our nurse specialist Inetta Fermo.  Please consider scheduling this before you leave today   Let us know when you get your COVID vaccine at the pharmacy.  Please stop by lab before you go If you have mychart- we will send your results within 3 business days of [place] receiving them.  If you do not have mychart- we will call you about results within 5 business days of [place] receiving them.  *please also note that you will see labs on mychart as soon as they post. I will later go in and write notes on them- will say "notes from Dr. Durene Cal"   Recommended follow up: Return in about 6 months (around 05/12/2024) for followup or sooner if needed.Schedule b4 you leave.

## 2023-11-14 ENCOUNTER — Encounter: Payer: Self-pay | Admitting: Family Medicine

## 2023-11-14 DIAGNOSIS — Z23 Encounter for immunization: Secondary | ICD-10-CM | POA: Diagnosis not present

## 2023-11-14 NOTE — Telephone Encounter (Signed)
Vaccine updated and bp noted.

## 2023-11-15 ENCOUNTER — Other Ambulatory Visit (HOSPITAL_COMMUNITY)
Admission: RE | Admit: 2023-11-15 | Discharge: 2023-11-15 | Disposition: A | Discharge status: Home / Self Care | Payer: Medicare Other | Source: Ambulatory Visit | Attending: Oncology | Admitting: Oncology

## 2023-11-15 DIAGNOSIS — Z006 Encounter for examination for normal comparison and control in clinical research program: Secondary | ICD-10-CM | POA: Insufficient documentation

## 2023-11-28 LAB — GENECONNECT MOLECULAR SCREEN: Genetic Analysis Overall Interpretation: NEGATIVE

## 2023-11-30 DIAGNOSIS — H353131 Nonexudative age-related macular degeneration, bilateral, early dry stage: Secondary | ICD-10-CM | POA: Diagnosis not present

## 2023-11-30 DIAGNOSIS — Z961 Presence of intraocular lens: Secondary | ICD-10-CM | POA: Diagnosis not present

## 2023-12-04 ENCOUNTER — Ambulatory Visit: Payer: Medicare Other | Attending: Cardiovascular Disease

## 2023-12-04 DIAGNOSIS — L814 Other melanin hyperpigmentation: Secondary | ICD-10-CM | POA: Diagnosis not present

## 2023-12-04 DIAGNOSIS — I4819 Other persistent atrial fibrillation: Secondary | ICD-10-CM | POA: Diagnosis not present

## 2023-12-04 DIAGNOSIS — Z7901 Long term (current) use of anticoagulants: Secondary | ICD-10-CM | POA: Diagnosis not present

## 2023-12-04 DIAGNOSIS — L821 Other seborrheic keratosis: Secondary | ICD-10-CM | POA: Diagnosis not present

## 2023-12-04 DIAGNOSIS — L570 Actinic keratosis: Secondary | ICD-10-CM | POA: Diagnosis not present

## 2023-12-04 LAB — POCT INR: INR: 3.2 — AB (ref 2.0–3.0)

## 2023-12-04 NOTE — Patient Instructions (Signed)
Continue taking 1 tablet daily except 2 tablets on Mondays, Wednesday, and Friday. Eat greens tonight.  Recheck INR in 6 weeks. Continue eating 1-2 leafy iceberg salads a week. Coumadin Clinic [phone]

## 2023-12-11 ENCOUNTER — Encounter: Payer: Self-pay | Admitting: Family Medicine

## 2024-01-01 ENCOUNTER — Ambulatory Visit (INDEPENDENT_AMBULATORY_CARE_PROVIDER_SITE_OTHER): Payer: Medicare Other

## 2024-01-01 DIAGNOSIS — I442 Atrioventricular block, complete: Secondary | ICD-10-CM | POA: Diagnosis not present

## 2024-01-01 LAB — CUP PACEART REMOTE DEVICE CHECK
Date Time Interrogation Session: 20250106102844
Implantable Lead Connection Status: 753985
Implantable Lead Connection Status: 753985
Implantable Lead Implant Date: 20230106
Implantable Lead Implant Date: 20230106
Implantable Lead Location: 753858
Implantable Lead Location: 753859
Implantable Lead Model: 377171
Implantable Lead Model: 377171
Implantable Lead Serial Number: 8000603602
Implantable Lead Serial Number: 8000650012
Implantable Pulse Generator Implant Date: 20230106
Pulse Gen Model: 407145
Pulse Gen Serial Number: 70291848

## 2024-01-08 DIAGNOSIS — L821 Other seborrheic keratosis: Secondary | ICD-10-CM | POA: Diagnosis not present

## 2024-01-08 DIAGNOSIS — L814 Other melanin hyperpigmentation: Secondary | ICD-10-CM | POA: Diagnosis not present

## 2024-01-08 DIAGNOSIS — L570 Actinic keratosis: Secondary | ICD-10-CM | POA: Diagnosis not present

## 2024-01-15 ENCOUNTER — Ambulatory Visit: Payer: Medicare Other | Attending: Cardiovascular Disease | Admitting: *Deleted

## 2024-01-15 DIAGNOSIS — I4819 Other persistent atrial fibrillation: Secondary | ICD-10-CM | POA: Insufficient documentation

## 2024-01-15 DIAGNOSIS — Z7901 Long term (current) use of anticoagulants: Secondary | ICD-10-CM | POA: Diagnosis not present

## 2024-01-15 LAB — POCT INR: INR: 2.4 (ref 2.0–3.0)

## 2024-01-15 NOTE — Patient Instructions (Addendum)
 Description   Continue taking 1 tablet daily except 2 tablets on Mondays, Wednesday, and Friday. Recheck INR in 6 weeks. Continue eating 1-2 leafy iceberg salads a week. Coumadin Clinic [phone]

## 2024-02-01 ENCOUNTER — Other Ambulatory Visit: Payer: Self-pay | Admitting: Cardiology

## 2024-02-05 DIAGNOSIS — L820 Inflamed seborrheic keratosis: Secondary | ICD-10-CM | POA: Diagnosis not present

## 2024-02-05 DIAGNOSIS — L570 Actinic keratosis: Secondary | ICD-10-CM | POA: Diagnosis not present

## 2024-02-07 NOTE — Progress Notes (Signed)
Remote pacemaker transmission.

## 2024-02-19 ENCOUNTER — Other Ambulatory Visit: Payer: Self-pay | Admitting: Cardiology

## 2024-02-26 ENCOUNTER — Ambulatory Visit: Payer: Medicare Other | Attending: Cardiology

## 2024-02-26 DIAGNOSIS — I4819 Other persistent atrial fibrillation: Secondary | ICD-10-CM | POA: Diagnosis not present

## 2024-02-26 DIAGNOSIS — Z7901 Long term (current) use of anticoagulants: Secondary | ICD-10-CM | POA: Diagnosis not present

## 2024-02-26 LAB — POCT INR: INR: 2.8 (ref 2.0–3.0)

## 2024-02-26 NOTE — Patient Instructions (Signed)
 Continue taking 1 tablet daily except 2 tablets on Mondays, Wednesday, and Friday. Recheck INR in 6 weeks. Continue eating 1-2 leafy iceberg salads a week. Coumadin Clinic [phone]

## 2024-04-01 ENCOUNTER — Ambulatory Visit: Payer: Medicare Other

## 2024-04-01 DIAGNOSIS — I442 Atrioventricular block, complete: Secondary | ICD-10-CM

## 2024-04-01 LAB — CUP PACEART REMOTE DEVICE CHECK
Date Time Interrogation Session: 20250407090640
Implantable Lead Connection Status: 753985
Implantable Lead Connection Status: 753985
Implantable Lead Implant Date: 20230106
Implantable Lead Implant Date: 20230106
Implantable Lead Location: 753858
Implantable Lead Location: 753859
Implantable Lead Model: 377171
Implantable Lead Model: 377171
Implantable Lead Serial Number: 8000603602
Implantable Lead Serial Number: 8000650012
Implantable Pulse Generator Implant Date: 20230106
Pulse Gen Model: 407145
Pulse Gen Serial Number: 70291848

## 2024-04-06 ENCOUNTER — Encounter: Payer: Self-pay | Admitting: Cardiology

## 2024-04-08 ENCOUNTER — Ambulatory Visit: Attending: Cardiology

## 2024-04-08 DIAGNOSIS — Z7901 Long term (current) use of anticoagulants: Secondary | ICD-10-CM | POA: Insufficient documentation

## 2024-04-08 DIAGNOSIS — I4819 Other persistent atrial fibrillation: Secondary | ICD-10-CM | POA: Diagnosis not present

## 2024-04-08 LAB — POCT INR: INR: 3.2 — AB (ref 2.0–3.0)

## 2024-04-08 NOTE — Patient Instructions (Signed)
 Take 1.5 tablets today only then Continue taking 1 tablet daily except 2 tablets on Mondays, Wednesday, and Friday. Recheck INR in 6 weeks. Continue eating 1-2 leafy iceberg salads a week. Coumadin Clinic [phone]

## 2024-04-25 ENCOUNTER — Telehealth: Payer: Self-pay | Admitting: Family Medicine

## 2024-04-25 NOTE — Telephone Encounter (Signed)
 Copied from CRM [id number]. Topic: Appointments - Appointment Scheduling >> Apr 25, 2024  3:16 PM Allyne Areola wrote: Patient/patient representative is calling to schedule an appointment. Refer to attachments for appointment information. Patient is seen at [facility] and would like to know if she can do the inr coumadin  clinic at our office.

## 2024-04-26 NOTE — Telephone Encounter (Signed)
 Contacted pt for coumadin  clinic apt. Pt prefers to go to [facility] coumadin  clinic because she lives closer to that clinic.   Scheduled pt for 5/23 per request. Provided direct phone number to coumadin  clinic. Pt verbalized understanding.

## 2024-05-13 NOTE — Progress Notes (Signed)
 Remote pacemaker transmission.

## 2024-05-15 ENCOUNTER — Ambulatory Visit (INDEPENDENT_AMBULATORY_CARE_PROVIDER_SITE_OTHER): Payer: Medicare Other | Admitting: Family Medicine

## 2024-05-15 ENCOUNTER — Encounter: Payer: Self-pay | Admitting: Family Medicine

## 2024-05-15 VITALS — BP 122/70 | HR 84 | Temp 97.3°F | Ht 65.25 in | Wt 164.2 lb

## 2024-05-15 DIAGNOSIS — M85852 Other specified disorders of bone density and structure, left thigh: Secondary | ICD-10-CM | POA: Diagnosis not present

## 2024-05-15 DIAGNOSIS — Z95811 Presence of heart assist device: Secondary | ICD-10-CM | POA: Diagnosis not present

## 2024-05-15 DIAGNOSIS — R739 Hyperglycemia, unspecified: Secondary | ICD-10-CM | POA: Diagnosis not present

## 2024-05-15 DIAGNOSIS — M85851 Other specified disorders of bone density and structure, right thigh: Secondary | ICD-10-CM | POA: Diagnosis not present

## 2024-05-15 DIAGNOSIS — I10 Essential (primary) hypertension: Secondary | ICD-10-CM

## 2024-05-15 DIAGNOSIS — I4819 Other persistent atrial fibrillation: Secondary | ICD-10-CM | POA: Diagnosis not present

## 2024-05-15 DIAGNOSIS — Z131 Encounter for screening for diabetes mellitus: Secondary | ICD-10-CM

## 2024-05-15 LAB — COMPREHENSIVE METABOLIC PANEL WITH GFR
ALT: 21 U/L (ref 0–35)
AST: 22 U/L (ref 0–37)
Albumin: 4.4 g/dL (ref 3.5–5.2)
Alkaline Phosphatase: 84 U/L (ref 39–117)
BUN: 25 mg/dL — ABNORMAL HIGH (ref 6–23)
CO2: 28 meq/L (ref 19–32)
Calcium: 9.3 mg/dL (ref 8.4–10.5)
Chloride: 100 meq/L (ref 96–112)
Creatinine, Ser: 0.88 mg/dL (ref 0.40–1.20)
GFR: 59.33 mL/min — ABNORMAL LOW (ref >60.00)
Glucose, Bld: 96 mg/dL (ref 70–99)
Potassium: 3.8 meq/L (ref 3.5–5.1)
Sodium: 137 meq/L (ref 135–145)
Total Bilirubin: 1.1 mg/dL (ref 0.2–1.2)
Total Protein: 7.2 g/dL (ref 6.0–8.3)

## 2024-05-15 LAB — HEMOGLOBIN A1C: Hgb A1c MFr Bld: 5.8 % (ref 4.6–6.5)

## 2024-05-15 MED ORDER — TIZANIDINE HCL 2 MG PO CAPS: 2.0000 mg | ORAL_CAPSULE | Freq: Two times a day (BID) | ORAL | 0 refills | Status: AC | PRN

## 2024-05-15 NOTE — Patient Instructions (Addendum)
 You are eligible to schedule your annual wellness visit with our nurse specialist Brian Campanile.  Please consider scheduling this before you leave today  Call [facility] if you don't hear within a week  I like your idea of getting hearing checked- worth checking at [facility] Phone: [phone] See forms as well  Please stop by lab before you go If you have mychart- we will send your results within 3 business days of us  receiving them.  If you do not have mychart- we will call you about results within 5 business days of us  receiving them.  *please also note that you will see labs on mychart as soon as they post. I will later go in and write notes on them- will say "notes from Dr. Arlene Ben"   Recommended follow up: Return in about 6 months (around 11/15/2024) for followup or sooner if needed.Schedule b4 you leave.

## 2024-05-15 NOTE — Progress Notes (Signed)
 Phone [phone] In person visit   Subjective:   Adriana Hendricks is a 89 y.o. year old very pleasant female patient who presents for/with See problem oriented charting Chief Complaint  Patient presents with   Medical Management of Chronic Issues   Hyperlipidemia   Hypertension    Past Medical History-  Patient Active Problem List   Diagnosis Date Noted   Sinus node dysfunction (HCC)     Priority: High   S/P minimally invasive tricuspid valve repair 06/12/2019    Priority: High   S/P Maze operation for atrial fibrillation 06/12/2019    Priority: High   Rheumatic tricuspid valve regurgitation     Priority: High   Persistent atrial fibrillation (HCC)     Priority: High   Osteopenia 03/13/2017    Priority: High   Hyperglycemia 10/26/2021    Priority: Medium    Bicuspid aortic valve 01/28/2019    Priority: Medium    DOE (dyspnea on exertion) 07/26/2018    Priority: Medium    Ocular migraine 03/13/2017    Priority: Medium    OSA (obstructive sleep apnea) 12/21/2016    Priority: Medium    Lower extremity neuropathy 08/23/2013    Priority: Medium    Hyperlipidemia with target LDL less than 100 01/07/2010    Priority: Medium    GERD with stricture 06/19/2009    Priority: Medium    Essential tremor 11/16/2007    Priority: Medium    Essential hypertension 11/16/2007    Priority: Medium    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    Priority: Low   Tricuspid valve insufficiency     Priority: Low   Long term (current) use of anticoagulants: CHA2DS2Vasc 5, WARFARIN - Rheumatic Vavle Disease 08/28/2018    Priority: Low   IBS (irritable bowel syndrome) 09/12/2016    Priority: Low   History of adenomatous polyp of colon 09/12/2016    Priority: Low   Former smoker 09/12/2016    Priority: Low   Carpal tunnel syndrome 09/04/2013    Priority: Low   Achilles tendon tear 01/17/2011    Priority: Low   ACNE ROSACEA 04/03/2008    Priority: Low   Heart block AV complete  (HCC) - s/p PPM 11/29/2021   Varicose veins of lower extremity with edema 06/25/2021   Palpitations 11/10/2019   Pleural effusion, left     Medications- reviewed and updated Current Outpatient Medications  Medication Sig Dispense Refill   amoxicillin  (AMOXIL ) 500 MG capsule TAKE 4 CAPSULES (2000 MG) BY MOUTH FOR ONE DOSE 1 HOUR PRIOR TO DENTAL PROCEDURE. 4 capsule 2   B Complex Vitamins (VITAMIN B COMPLEX) TABS Take 1 tablet by mouth daily.     calcium carbonate (TUMS - DOSED IN MG ELEMENTAL CALCIUM) 500 MG chewable tablet Chew 1 tablet by mouth daily.      cholecalciferol (VITAMIN D3) 25 MCG (1000 UT) tablet Take 1,000 Units by mouth daily.     famotidine  (PEPCID ) 20 MG tablet Take 20 mg by mouth at bedtime.     furosemide  (LASIX ) 20 MG tablet TAKE 1 TABLET (20 MG TOTAL) BY MOUTH ONCE DAILY. MAY TAKE AN ADDITIONAL 20 MG IF NEEDED FOR SWELLING OR SHORTNESS OF BREATH. 110 tablet 0   metoprolol  tartrate (LOPRESSOR ) 25 MG tablet Take 1 tablet (25 mg total) by mouth as needed. 20 tablet 6   Multiple Vitamins-Minerals (PRESERVISION AREDS 2 PO) Take 1 capsule by mouth in the morning and at bedtime.     nadolol  (CORGARD ) 20 MG  tablet TAKE 1 TABLET BY MOUTH ONCE DAILY 90 tablet 2   potassium chloride  20 MEQ/15ML (10%) SOLN TAKE 15 ML BY MOUTH ONCE DAILY *DILUTE WITH 4-8 OUNCES OF LIQUID* *TAKE WITH FOOD* 15 mL 0   TURMERIC PO Take 500 mg by mouth daily.     warfarin (COUMADIN ) 2.5 MG tablet TAKE 1 TO 2 TABLETS BY MOUTH ONCE DAILY OR AS INSTRUCTED BY COUMADIN  CLINIC *HAZARDOUS DRUG: WEAR GLOVES* *DO NOT CRUSH* 120 tablet 10   tizanidine  (ZANAFLEX ) 2 MG capsule Take 1 capsule (2 mg total) by mouth 2 (two) times daily as needed for muscle spasms (dont drive for 8 hours after taking). 30 capsule 0   No current facility-administered medications for this visit.     Objective:  BP 122/70   Pulse 84   Temp (!) 97.3 F (36.3 C)   Ht 5' 5.25" (1.657 m)   Wt 164 lb 3.2 oz (74.5 kg)   LMP  (LMP Unknown)    SpO2 95%   BMI 27.12 kg/m  Gen: NAD, resting comfortably Some cerumen in right ear easily removed with curette CV: RRR no murmurs rubs or gallops Lungs: CTAB no crackles, wheeze, rhonchi Abdomen: soft/nontender/nondistended/normal bowel sounds. No rebound or guarding.  Ext: no edema Skin: warm, dry Neuro: grossly normal, moves all extremities     Assessment and Plan   #Back pain- low back pain worse with certain chairs- mild muscle relaxant helped in past- has old prescription still. Wants refill tizanidine . Declines x-ray  #some difficulty hearing- considering hearing aids  #onychomycosis- on great toe right foot- declines treatment.   # Atrial fibrillation-follows with Dr. Addie Holstein S: Rate controlled with nadolol  20 mg daily.  Has metoprolol  available if needed Anticoagulated with  Coumadin - [place] with Cathleen Coach planned -a fib maze/tricuspid valve replacement late June 12, 2019 hospitalization-has appeared to stay out of A-fib since that time A/P: appropriately anticoagulated and rate controlled- continue current medicine     #Complete heart block/sinus node dysfunction S: Permanent pacemaker implant December 31, 2021 -Patient prior to this had multiple syncopal episodes arrhythmic in origin- no recent issues A/P: doing well- continue close follow up with cardiology   % Status post minimally invasive tricuspid valve repair (related to rheumatism)-still with moderate to severe tricuspid regurgitation on 03/30/2022 echocardiogram.  Also with thickening of aortic valve without significant narrowing/stenosis- thankfully stable- continue close follow up with cardiology    #hypertension and essential tremor S: medication: Lasix  20 mg daily- extra dose as needed for edema- not needing much lately, nadolol  20 mg daily  Home readings #s: 120s at home every tuesday BP Readings from Last 3 Encounters:  05/15/24 122/70  11/13/23 124/76  10/23/23 132/84  A/P: hypertension well  controlled continue current medications  -Does have mild age-related changes to kidney function with GFR in high 50s-continue to [facility] with CMP   #hyperlipidemia-over age 70 does not want to add medicine for primary prevention S: Medication:none Lab Results  Component Value Date   CHOL 214 (H) 11/13/2023   HDL 68.20 11/13/2023   LDLCALC 125 (H) 11/13/2023   LDLDIRECT 128.8 01/17/2011   TRIG 107.0 11/13/2023   CHOLHDL 3 11/13/2023  A/P: lipids mildly high but outside of age range for primary prevention    # Hyperglycemia/insulin  resistance/prediabetes-A1c 5.01 September 2021 S:  Medication: none Exercise and diet- encouraged to improve exercise but tough with her hands and back . Might try tone and balance plus does get 6000 steps a day Lab Results  Component Value Date   HGBA1C 6.0 11/13/2023   HGBA1C 5.9 05/10/2023   HGBA1C 6.0 11/04/2022  A/P: hopefully stable- update a1c today. Continue without meds for now   # GERD with history of stricture S:Medication: Famotidine  20 mg down to once a day before bed and helpful still -for most part does well unless dry bread or dry chicken  A/P: well controlled continue current medications     #OSA- using her CPAP through Dr. Matilde Son  #compression stockings- wearing for varicosities   #Ocular migraine- ongoing issues but has basically been cleared by Dr. Lydia Sams. 3-4 a week. Didn't tolerate topamax  in past. Otps out of imaging  # Osteopenia-noted 05/12/2022 but roughly stable from 2021-opted to repeat in 2 years- check today.  - takes calcium half Tums 500 mg a day and d3 with it  Recommended follow up: Return in about 6 months (around 11/15/2024) for followup or sooner if needed.Schedule b4 you leave. Future Appointments  Date Time Provider Department Center  05/17/2024  9:30 AM [facility] None  07/01/2024  7:05 AM [facility] DEVICE REMOTES [facility] H&V  09/23/2024 10:00 AM Antonio Baumgarten, NP [facility] None   09/30/2024  7:05 AM [facility] HVT DEVICE REMOTES [facility] H&V  12/30/2024  7:05 AM CVD HVT DEVICE REMOTES [facility] H&V  03/31/2025  7:05 AM [facility] HVT DEVICE REMOTES [facility] H&V  06/30/2025  7:05 AM [facility] HVT DEVICE REMOTES [facility] H&V  09/29/2025  7:05 AM CVD HVT DEVICE REMOTES [facility] H&V  12/29/2025  7:05 AM CVD HVT DEVICE REMOTES [facility] H&V  03/30/2026  7:05 AM CVD HVT DEVICE REMOTES [facility] H&V  06/29/2026  7:05 AM CVD HVT DEVICE REMOTES [facility] H&V    Lab/Order associations:   ICD-10-CM   1. Osteopenia of necks of both femurs  M85.851 DG Bone Density   M85.852     2. Essential hypertension  I10 Comprehensive metabolic panel with GFR    3. Hyperglycemia  R73.9 Hemoglobin A1c    4. Persistent atrial fibrillation (HCC)  I48.19     5. Screening for diabetes mellitus  Z13.1 Hemoglobin A1c    6. Presence of heart assist device [facility]) Chronic Z95.811       Meds ordered this encounter  Medications   tizanidine  (ZANAFLEX ) 2 MG capsule    Sig: Take 1 capsule (2 mg total) by mouth 2 (two) times daily as needed for muscle spasms (dont drive for 8 hours after taking).    Dispense:  30 capsule    Refill:  0    Return precautions advised.  Clarisa Crooked, MD

## 2024-05-16 ENCOUNTER — Ambulatory Visit: Payer: Self-pay | Admitting: Family Medicine

## 2024-05-17 ENCOUNTER — Ambulatory Visit (INDEPENDENT_AMBULATORY_CARE_PROVIDER_SITE_OTHER)

## 2024-05-17 DIAGNOSIS — Z7901 Long term (current) use of anticoagulants: Secondary | ICD-10-CM | POA: Diagnosis not present

## 2024-05-17 LAB — POCT INR: INR: 2.9 (ref 2.0–3.0)

## 2024-05-17 NOTE — Patient Instructions (Addendum)
 Pre visit review using our clinic review tool, if applicable. No additional management support is needed unless otherwise documented below in the visit note.  Continue taking 1 tablet daily except 2 tablets on Mondays, Wednesday, and Friday. Recheck INR in 5 weeks. Phone number to the [place] anticoagulation clinic is [phone].

## 2024-05-17 NOTE — Progress Notes (Signed)
 Continue taking 1 tablet daily except 2 tablets on Mondays, Wednesday, and Friday. Recheck INR in 5 weeks. Phone number to the [place] anticoagulation clinic is [phone].

## 2024-05-21 ENCOUNTER — Ambulatory Visit

## 2024-06-12 ENCOUNTER — Ambulatory Visit (INDEPENDENT_AMBULATORY_CARE_PROVIDER_SITE_OTHER)

## 2024-06-12 ENCOUNTER — Ambulatory Visit (INDEPENDENT_AMBULATORY_CARE_PROVIDER_SITE_OTHER): Admitting: Family

## 2024-06-12 ENCOUNTER — Ambulatory Visit: Payer: Self-pay | Admitting: Family

## 2024-06-12 ENCOUNTER — Telehealth: Payer: Self-pay

## 2024-06-12 VITALS — BP 118/86 | HR 75 | Temp 98.1°F | Ht 65.25 in | Wt 156.8 lb

## 2024-06-12 DIAGNOSIS — R053 Chronic cough: Secondary | ICD-10-CM | POA: Diagnosis not present

## 2024-06-12 DIAGNOSIS — R918 Other nonspecific abnormal finding of lung field: Secondary | ICD-10-CM | POA: Diagnosis not present

## 2024-06-12 DIAGNOSIS — I517 Cardiomegaly: Secondary | ICD-10-CM | POA: Diagnosis not present

## 2024-06-12 DIAGNOSIS — R0989 Other specified symptoms and signs involving the circulatory and respiratory systems: Secondary | ICD-10-CM | POA: Diagnosis not present

## 2024-06-12 MED ORDER — PREDNISONE 20 MG PO TABS: ORAL_TABLET | ORAL | 0 refills | Status: DC

## 2024-06-12 NOTE — Telephone Encounter (Signed)
 Pt reports she had an OV today and was prescribed prednisone. They recommended she have INR checked sooner than her now scheduled apt for 6/27. Rs apt for 6/24, pt reports she will hope to start the prednisone tomorrow. Advised to watch for s/s of abnormal bruising or bleeding and also s/s of a clot. Advised is she had any s/s to go to ER. Pt verbalized understanding.

## 2024-06-12 NOTE — Progress Notes (Signed)
 Advice please! 8 days cough, lots of clear phlegm, no fever, chills, rib pain from coughing, coarse rales in left base & hx of pleural eff after cardiac sgy -  gave her low dose pred x5d, CXR shows mild atelectasis - antibiotic?inhaler? Thx

## 2024-06-12 NOTE — Progress Notes (Signed)
 Patient ID: Adriana Hendricks, female    DOB: 1935-03-05, 89 y.o.   MRN: [id number]  Chief Complaint  Patient presents with   Sore Throat    States this is day 8. States sore throat that went to her chest. OTC is not really helping.   Discussed the use of AI scribe software for clinical note transcription with the patient, who gave verbal consent to proceed.  History of Present Illness Adriana Hendricks is an 89 year old female with a history of heart valve replacement and AFib who presents with persistent cough and mucus production.  She experiences a persistent cough with clear mucus production, requiring positional changes to facilitate movement. Frequent coughing has caused sore ribs for two days. She uses Mucinex to manage chest tightness and shortness of breath. No current asthma symptoms are present. COVID-19 tests were negative twice, last Wednesday and yesterday. Denies fever and chills. Her throat irritation from coughing is improving. She has consulted a nurse at [facility] twice for these symptoms. She denies fever, ear pain, or significant congestion, though her eustachian tubes tend to block. Her medical history includes heart valve replacement, a maze procedure for AFib, and a pacemaker. She has had fluid drained from her lungs twice post-surgery. Her medications include Coumadin , Lasix , nadolol , and liquid potassium. She monitors her blood levels for Coumadin  regularly and has an upcoming appointment.   Assessment & Plan Cough with Mucus Production - Cough for 8days with clear mucus/phlegm, chest tightness, and shortness of breath. No asthma or significant seasonal allergies. History of heart surgeries and left pleural effusion requiring drainage raises concerns about potential fluid accumulation, but she reports she felt much worse during that time. - Order chest X-ray to evaluate for fluid accumulation. - Prescribe low dose prednisone for 5 days. - Advise taking prednisone in the  morning with food. Can also take Tylenol  prn for rib pain. - Discuss potential side effects of prednisone, including monitoring for increased bleeding with warfarin, low risk. Call coumadin  nurse to get checked next Tuesday instead of waiting another week. - Consider inhaler use if SOB persists. - Call office if sx are not improved after finishing prednisone.  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 Pacemaker in place, takes warfarin daily, followed by cardiology annually. Scheduled for INR check on June 21, 2024, but recommends earlier check due to prednisone. - Contact Adriana Hendricks to schedule INR check on June 18, 2024. - Ensure communication with INR management team regarding prednisone.   Subjective:    Outpatient Medications Prior to Visit  Medication Sig Dispense Refill   B Complex Vitamins (VITAMIN B COMPLEX) TABS Take 1 tablet by mouth daily.     calcium carbonate (TUMS - DOSED IN MG ELEMENTAL CALCIUM) 500 MG chewable tablet Chew 1 tablet by mouth daily.      cholecalciferol (VITAMIN D3) 25 MCG (1000 UT) tablet Take 1,000 Units by mouth daily.     famotidine  (PEPCID ) 20 MG tablet Take 20 mg by mouth at bedtime.     furosemide  (LASIX ) 20 MG tablet TAKE 1 TABLET (20 MG TOTAL) BY MOUTH ONCE DAILY. MAY TAKE AN ADDITIONAL 20 MG IF NEEDED FOR SWELLING OR SHORTNESS OF BREATH. 110 tablet 0   metoprolol  tartrate (LOPRESSOR ) 25 MG tablet Take 1 tablet (25 mg total) by mouth as needed. 20 tablet 6   Multiple Vitamins-Minerals (PRESERVISION AREDS 2 PO) Take 1 capsule by mouth in the morning and at bedtime.  nadolol  (CORGARD ) 20 MG tablet TAKE 1 TABLET BY MOUTH ONCE DAILY 90 tablet 2   potassium chloride  20 MEQ/15ML (10%) SOLN TAKE 15 ML BY MOUTH ONCE DAILY *DILUTE WITH 4-8 OUNCES OF LIQUID* *TAKE WITH FOOD* 15 mL 0   tizanidine  (ZANAFLEX ) 2 MG capsule Take 1 capsule (2 mg total) by mouth 2 (two) times daily as  needed for muscle spasms (dont drive for 8 hours after taking). 30 capsule 0   TURMERIC PO Take 500 mg by mouth daily.     warfarin (COUMADIN ) 2.5 MG tablet TAKE 1 TO 2 TABLETS BY MOUTH ONCE DAILY OR AS INSTRUCTED BY COUMADIN  CLINIC *HAZARDOUS DRUG: WEAR GLOVES* *DO NOT CRUSH* 120 tablet 10   amoxicillin  (AMOXIL ) 500 MG capsule TAKE 4 CAPSULES (2000 MG) BY MOUTH FOR ONE DOSE 1 HOUR PRIOR TO DENTAL PROCEDURE. (Patient not taking: Reported on 06/12/2024) 4 capsule 2   No facility-administered medications prior to visit.   Past Medical History:  Diagnosis Date   Achilles tendon rupture    Arthritis of hand, right    Atrial flutter with rapid ventricular response (HCC)    Carpal tunnel syndrome 09/04/2013   Collagenous colitis 2005   Colon polyps 2010   Tubular adenoma and hyperplastic   Dental infection 10/25/2014   Diverticulosis    Esophageal stenosis    Essential tremor    GERD (gastroesophageal reflux disease)    hx hiatal hernia, hx esophagitis, hx stricture   Hiatal hernia    Hypertension    Lower extremity neuropathy 08/23/2013   On B12 therapy with numbness in the feet bilaterally no evidence of diabetes    Ocular migraine    jagged vision, a few per month   OSA (obstructive sleep apnea) 12/21/2016   on CPAP   Peripheral neuropathy    treated by Dr Adriana Hendricks (07/2015)   Persistent atrial fibrillation (HCC)    Rate control with beta-blocker and anticoagulated with warfarin   Pleural effusion, left    S/P Maze operation for atrial fibrillation 06/12/2019   Complete bilateral atrial lesion set using cryothermy and bipolar radiofrequency ablation with clipping of LA appendage via right mini thoracotomy approach   S/P minimally invasive tricuspid valve repair 06/12/2019   Complex valvuloplasty including autologous pericardial patch augmentation of anterior and posterior leaflets with 28 mm Edwards mc3 ring annuloplasty via right mini thoracotomy approach   Severe tricuspid regurgitation  07/2018   Noted Aug 2019 during a fib workup. Severe LAE as well   Sinus node dysfunction (HCC)    Past Surgical History:  Procedure Laterality Date   48 hr Holter Monitor  07/2018   Persistent Afib (rate 33 - 124 bpm)   APPENDECTOMY     CARDIAC CATHETERIZATION     CARDIOVERSION N/A 10/02/2018   Procedure: CARDIOVERSION;  Surgeon: Alroy Aspen Lela Purple, MD;  Location: [facility] ENDOSCOPY;  Service: Cardiovascular;  Laterality: N/A;   CARPAL TUNNEL RELEASE     CATARACT EXTRACTION     EXCISION MORTON'S NEUROMA     Right foot   EYE SURGERY     Cataracts with implants-bilateral   INTRAOCULAR LENS IMPLANT, SECONDARY     IR THORACENTESIS ASP PLEURAL SPACE W/IMG GUIDE  07/10/2019   IR THORACENTESIS ASP PLEURAL SPACE W/IMG GUIDE  07/10/2019   IR THORACENTESIS ASP PLEURAL SPACE W/IMG GUIDE  07/26/2019   MINIMALLY INVASIVE MAZE PROCEDURE N/A 06/12/2019   Procedure: MINIMALLY INVASIVE MAZE PROCEDURE;  Surgeon: Gardenia Jump, MD;  Location: [facility] OR;  Service: Open Heart  Surgery;  Laterality: N/A;   MINIMALLY INVASIVE TRICUSPID VALVE REPAIR Right 06/12/2019   Procedure: MINIMALLY INVASIVE TRICUSPID VALVE REPAIR USING EDWARDS MC3 T28 ANNULOPLASTY RING, INSERTION TEMPORARY TRANSVENOUS AV PACING LEAD;  Surgeon: Gardenia Jump, MD;  Location: [facility] OR;  Service: Open Heart Surgery;  Laterality: Right;   Moles removed     MOUTH SURGERY     (For Exostosis)   NUCLEAR  07/2018   EF 71 %. LOW RISK - no ischemia or Infarct.    PACEMAKER IMPLANT N/A 12/31/2021   Procedure: PACEMAKER IMPLANT;  Surgeon: Boyce Byes, MD;  Location: [facility] INVASIVE CV LAB;  Service: Cardiovascular;  Laterality: N/A;   RIGHT/LEFT HEART CATH AND CORONARY ANGIOGRAPHY N/A 01/16/2019   Procedure: RIGHT/LEFT HEART CATH AND CORONARY ANGIOGRAPHY;  Surgeon: Arleen Lacer, MD;  Location: [facility] INVASIVE CV LAB;; Angiographically normal coronary arteries.  Normal LVEDP. Upper Limit of Normal PAP~23 mmHg & PCWP 18 mmHg.  LVEDP of 7 mmHg. -With mean  PAP of 23 mmHg there is a TPG suggesting a primary pulmonary etiology.   ROTATOR CUFF REPAIR     TEE WITHOUT CARDIOVERSION N/A 01/16/2019   Procedure: TRANSESOPHAGEAL ECHOCARDIOGRAM (TEE);  Surgeon: Jacqueline Matsu, MD;  Location: [facility] ENDOSCOPY;;  Severe TR due to poor coaptation of the leaflets from annular dilation.  Mild to moderate mitral vegetation with mildly restricted mobility of the posterior leaflet.  EF 55-60% with no R WMA.  Severe RA and LA dilation.   TEE WITHOUT CARDIOVERSION N/A 06/12/2019   Procedure: TRANSESOPHAGEAL ECHOCARDIOGRAM (TEE);  Surgeon: Gardenia Jump, MD;  Location: [facility] OR;  Service: Open Heart Surgery;  Laterality: N/A;   TONSILLECTOMY     TRANSTHORACIC ECHOCARDIOGRAM  08/05/2019   First postop AVR: EF 60 -65%. Mod Conc LVH. Gr III DD (? w/ Mild LA dilation).  Rheumatic MV w/ mod thickening & Ca2+. Mild doming of AML - NO MVP.  Mild-mod MR & MS (mean MVA gradient 7 mmHg).  ~ functional bicuspid AoV w/ Mod Sclerosis - no AS.  mild TA dilation;; b) 2 y/r Post-Op: EF 60-65%.  No RWMA. ?D Fxn w/ mild LA dil. High PAP ~64.5 mmHg.  Mild-Mod RA dil. Mod MR; Severe TR (repaired)   TRANSTHORACIC ECHOCARDIOGRAM  03/30/2022   EF 55 to 60%.  No RWMA.  Mild LVH.  Indeterminate filling pressures.  Mild LA dilation.  Mildly elevated PAP with Moderate to severe TR, and moderate RA dilation -> normal RAP.  Aortic valve appears to be functionally bicuspid with fused left and right cusps with moderate calcification.  Mild AS.  Aortic root dilated to 42 mm.; Comparison(s): 09/08/21 EF 60-65%. Moderate MR. Moderate-severe TR.   Allergies  Allergen Reactions   Levofloxacin  Other (See Comments)    Developed tendon pain. (Has a history of a Achilles tendon tear)   Potassium Chloride  Other (See Comments)    Can't take tables   Clarithromycin Nausea Only   Erythromycin Base Nausea And Vomiting   Metronidazole Rash   Penicillins Rash and Other (See Comments)    Did it involve swelling of the  face/tongue/throat, SOB, or low BP? No Did it involve sudden or severe rash/hives, skin peeling, or any reaction on the inside of your mouth or nose? No Did you need to seek medical attention at a hospital or doctor's office? No When did it last happen?  as a child If all above answers are NO, may proceed with cephalosporin use.       Objective:  Physical Exam Vitals and nursing note reviewed.  Constitutional:      Appearance: Normal appearance.  HENT:     Mouth/Throat:     Mouth: Mucous membranes are moist.     Pharynx: Posterior oropharyngeal erythema (mild) present. No pharyngeal swelling, oropharyngeal exudate, uvula swelling or postnasal drip.   Cardiovascular:     Rate and Rhythm: Normal rate and regular rhythm.  Pulmonary:     Effort: Pulmonary effort is normal.     Breath sounds: Examination of the left-middle field reveals rhonchi and rales. Examination of the left-lower field reveals rhonchi and rales. Rhonchi and rales present.   Musculoskeletal:        General: Normal range of motion.  Lymphadenopathy:     Head:     Right side of head: No submandibular, tonsillar, preauricular, posterior auricular or occipital adenopathy.     Left side of head: No submandibular, tonsillar, preauricular, posterior auricular or occipital adenopathy.     Cervical: No cervical adenopathy.   Skin:    General: Skin is warm and dry.   Neurological:     Mental Status: She is alert.   Psychiatric:        Mood and Affect: Mood normal.        Behavior: Behavior normal.    BP 118/86   Pulse 75   Temp 98.1 F (36.7 C) (Temporal)   Ht 5' 5.25 (1.657 m)   Wt 156 lb 12.8 oz (71.1 kg)   LMP  (LMP Unknown)   SpO2 98%   BMI 25.89 kg/m  Wt Readings from Last 3 Encounters:  06/12/24 156 lb 12.8 oz (71.1 kg)  05/15/24 164 lb 3.2 oz (74.5 kg)  11/13/23 164 lb 6.4 oz (74.6 kg)       Versa Gore, NP

## 2024-06-14 NOTE — Progress Notes (Signed)
 please call patient and let her know her chest xray does not show pneumonia, bronchitis or any pleural effusion. She has some mild atelectasis which can benefit from the prednisone given as well as practicing deep breathing. If she has an Facilities manager (plastic device with blue tubing given in the hospital after lung infections) it would be good to practice using this about 4-6 times per day to help expand her lung tissue and allow for easier breathing.  Let me know if she is feeling any better or worse? Does she still have a lot of chest phlegm that she is coughing up? Thx

## 2024-06-18 ENCOUNTER — Ambulatory Visit

## 2024-06-18 DIAGNOSIS — Z7901 Long term (current) use of anticoagulants: Secondary | ICD-10-CM

## 2024-06-18 LAB — POCT INR: INR: 4.5 — AB (ref 2.0–3.0)

## 2024-06-18 NOTE — Progress Notes (Addendum)
 Pt was prescribed prednisone  on 6/19, 40 mg x 3 days, 20 mg x 2 days, for productive cough, atelectasis on x-ray. Pt reports she has not been eating her normal salads over the last week due to illness.  Hold dose today and hold dose tomorrow and then change weekly dose to take taking 1 tablet daily except 2 tablets on Mondays and Wednesday. Recheck INR in 1 weeks. Phone number to the [place] anticoagulation clinic is [phone].  Medical screening examination/treatment/procedure(s) were performed by non-physician practitioner and as supervising physician I was immediately available for consultation/collaboration.  I agree with above. Karlynn Noel, MD

## 2024-06-18 NOTE — Patient Instructions (Addendum)
 Pre visit review using our clinic review tool, if applicable. No additional management support is needed unless otherwise documented below in the visit note.  Hold dose today and hold dose tomorrow and then change weekly dose to take taking 1 tablet daily except 2 tablets on Mondays and Wednesday. Recheck INR in 1 weeks. Phone number to the [place] anticoagulation clinic is [phone].

## 2024-06-21 ENCOUNTER — Ambulatory Visit

## 2024-06-25 ENCOUNTER — Ambulatory Visit (INDEPENDENT_AMBULATORY_CARE_PROVIDER_SITE_OTHER)

## 2024-06-25 DIAGNOSIS — Z7901 Long term (current) use of anticoagulants: Secondary | ICD-10-CM

## 2024-06-25 LAB — POCT INR: INR: 1.9 — AB (ref 2.0–3.0)

## 2024-06-25 NOTE — Patient Instructions (Addendum)
 Pre visit review using our clinic review tool, if applicable. No additional management support is needed unless otherwise documented below in the visit note.  Increase dose today to take 1 1/2 tablets and then continue 1 tablet daily except 2 tablets on Mondays and Wednesday. Recheck INR in 2 weeks. Phone number to the [place] anticoagulation clinic is [phone].

## 2024-06-25 NOTE — Progress Notes (Signed)
 Pt was prescribed prednisone  on 6/19, 40 mg x 3 days, 20 mg x 2 days, for productive cough, atelectasis on x-ray. Pt reports she has not been eating her normal salads over the last week due to illness.  Increase dose today to take 1 1/2 tablets and then continue 1 tablet daily except 2 tablets on Mondays and Wednesday. Recheck INR in 2 weeks. Phone number to the [place] anticoagulation clinic is [phone].

## 2024-07-01 ENCOUNTER — Ambulatory Visit: Payer: Medicare Other

## 2024-07-01 DIAGNOSIS — I442 Atrioventricular block, complete: Secondary | ICD-10-CM

## 2024-07-01 LAB — CUP PACEART REMOTE DEVICE CHECK
Date Time Interrogation Session: 20250707120206
Implantable Lead Connection Status: 753985
Implantable Lead Connection Status: 753985
Implantable Lead Implant Date: 20230106
Implantable Lead Implant Date: 20230106
Implantable Lead Location: 753858
Implantable Lead Location: 753859
Implantable Lead Model: 377171
Implantable Lead Model: 377171
Implantable Lead Serial Number: 8000603602
Implantable Lead Serial Number: 8000650012
Implantable Pulse Generator Implant Date: 20230106
Pulse Gen Model: 407145
Pulse Gen Serial Number: 70291848

## 2024-07-06 ENCOUNTER — Ambulatory Visit: Payer: Self-pay | Admitting: Cardiology

## 2024-07-08 ENCOUNTER — Telehealth: Payer: Self-pay

## 2024-07-08 NOTE — Telephone Encounter (Signed)
 Copied from CRM [id number]. Topic: Clinical - Order For Equipment >> Jul 08, 2024 12:18 PM Adriana Hendricks wrote: Reason for CRM: Patient states her ResMed 10 has broken - the motor air doesn't blow out. Patient would like to know what she can do to get Hendricks new one, her next appointment isn't until 9/29.  Callback number: [phone]   I called and spoke to pt. Pt states her CPAP machine is broken and needs it to be fixed or replaced before upcoming appt. I informed pt that she needs to call [employer] and see if they are able to replace the motor or fix it before we just replace it. Pt verbalized understanding. I gave pt [employer]'s number. NFN

## 2024-07-09 ENCOUNTER — Ambulatory Visit

## 2024-07-12 ENCOUNTER — Ambulatory Visit

## 2024-07-12 DIAGNOSIS — Z7901 Long term (current) use of anticoagulants: Secondary | ICD-10-CM

## 2024-07-12 LAB — POCT INR: INR: 4.6 — AB (ref 2.0–3.0)

## 2024-07-12 NOTE — Progress Notes (Signed)
 Pt reports she lives at [facility] and their dining room has been closed temporarily and only offering  one choice for meals a day. She reports her intake of vitamin K containing foods has decreases due to this. She also reports she had some cranberry sauce a few days ago. Advised this will increase INR. Pt denies any bleeding currently. Advised if any s/s of abnormal bruising or bleeding to go to ER. Pt verbalized understanding.  Hold dose today and hold dose tomorrow and then change weekly dose to take 1 tablet daily except 2 tablets on Wednesday. Recheck INR in 2 weeks. Phone number to the [place] anticoagulation clinic is [phone].

## 2024-07-12 NOTE — Patient Instructions (Addendum)
 Pre visit review using our clinic review tool, if applicable. No additional management support is needed unless otherwise documented below in the visit note.  Hold dose today and hold dose tomorrow and then change weekly dose to take 1 tablet daily except 2 tablets on Wednesday. Recheck INR in 2 weeks. Phone number to the [place] anticoagulation clinic is [phone].

## 2024-07-26 ENCOUNTER — Ambulatory Visit

## 2024-07-26 DIAGNOSIS — Z7901 Long term (current) use of anticoagulants: Secondary | ICD-10-CM | POA: Diagnosis not present

## 2024-07-26 LAB — POCT INR: INR: 2.4 (ref 2.0–3.0)

## 2024-07-26 NOTE — Patient Instructions (Addendum)
 Pre visit review using our clinic review tool, if applicable. No additional management support is needed unless otherwise documented below in the visit note.  Continue 1 tablet daily except 2 tablets on Wednesday. Recheck INR in 4 weeks. Phone number to the [place] anticoagulation clinic is [phone].

## 2024-07-26 NOTE — Progress Notes (Cosign Needed Addendum)
 Continue 1 tablet daily except 2 tablets on Wednesday. Recheck INR in 4 weeks. Phone number to the [place] anticoagulation clinic is [phone].  Medical screening examination/treatment/procedure(s) were performed by non-physician practitioner and as supervising physician I was immediately available for consultation/collaboration.  I agree with above. Karlynn Noel, MD

## 2024-08-16 ENCOUNTER — Encounter: Payer: Self-pay | Admitting: Family Medicine

## 2024-08-23 ENCOUNTER — Ambulatory Visit

## 2024-08-23 DIAGNOSIS — Z7901 Long term (current) use of anticoagulants: Secondary | ICD-10-CM

## 2024-08-23 LAB — POCT INR: INR: 3.2 — AB (ref 2.0–3.0)

## 2024-08-23 NOTE — Progress Notes (Signed)
 Hold dose today and then continue 1 tablet daily except 2 tablets on Wednesday. Recheck INR in 3 weeks. Phone number to the [place] anticoagulation clinic is [phone].

## 2024-08-23 NOTE — Patient Instructions (Addendum)
 Pre visit review using our clinic review tool, if applicable. No additional management support is needed unless otherwise documented below in the visit note.  Hold dose today and then continue 1 tablet daily except 2 tablets on Wednesday. Recheck INR in 3 weeks. Phone number to the [place] anticoagulation clinic is [phone].

## 2024-09-02 NOTE — Progress Notes (Unsigned)
  Electrophysiology Office Note:   ID:  Adriana Hendricks, DOB [birth year], MRN [id number]  Primary Cardiologist: Alm Clay, MD Electrophysiologist: OLE ONEIDA HOLTS, MD  {Click to update primary MD,subspecialty MD or APP then REFRESH:1}    History of Present Illness:   Adriana Hendricks is a 89 y.o. female with h/o CHB s/p PPM, s/p TV repair, and AF s/p MAZE seen today for routine electrophysiology followup.   Last seen in EP clinic 03/2022  Since last being seen in our clinic the patient reports doing ***.  she denies chest pain, palpitations, dyspnea, PND, orthopnea, nausea, vomiting, dizziness, syncope, edema, weight gain, or early satiety.   Review of systems complete and found to be negative unless listed in HPI.   EP Information / Studies Reviewed:    EKG is ordered today. Personal review as below.       PPM Interrogation-  reviewed in detail today,  See PACEART report.  Arrhythmia/Device History BIOTRONIK PPM CL   Physical Exam:   VS:  LMP  (LMP Unknown)    Wt Readings from Last 3 Encounters:  06/12/24 156 lb 12.8 oz (71.1 kg)  05/15/24 164 lb 3.2 oz (74.5 kg)  11/13/23 164 lb 6.4 oz (74.6 kg)     GEN: No acute distress  NECK: No JVD; No carotid bruits CARDIAC: {EPRHYTHM:28826}, no murmurs, rubs, gallops RESPIRATORY:  Clear to auscultation without rales, wheezing or rhonchi  ABDOMEN: Soft, non-tender, non-distended EXTREMITIES:  {EDEMA LEVEL:28147::No} edema; No deformity   ASSESSMENT AND PLAN:    CHB s/p Biotronik PPM  Normal PPM function See Pace Art report No changes today  Minimally invasive tricuspid repair history MAZE operation history Persistent atrial fibrillation Continue coumadin  for CHA2DS2/VASc of at least 4     {Click here to Review PMH, Prob List, Meds, Allergies, SHx, FHx  :1}   Disposition:   Follow up with {EPPROVIDERS:28135::EP Team} {EPFOLLOW UP:28173}  Signed, Ozell Prentice Passey, PA-C

## 2024-09-03 ENCOUNTER — Encounter: Payer: Self-pay | Admitting: Student

## 2024-09-03 ENCOUNTER — Ambulatory Visit: Attending: Student | Admitting: Student

## 2024-09-03 VITALS — BP 152/76 | HR 72 | Ht 65.0 in | Wt 161.3 lb

## 2024-09-03 DIAGNOSIS — Z9889 Other specified postprocedural states: Secondary | ICD-10-CM | POA: Insufficient documentation

## 2024-09-03 DIAGNOSIS — Z8679 Personal history of other diseases of the circulatory system: Secondary | ICD-10-CM | POA: Insufficient documentation

## 2024-09-03 DIAGNOSIS — I4819 Other persistent atrial fibrillation: Secondary | ICD-10-CM | POA: Insufficient documentation

## 2024-09-03 DIAGNOSIS — Z7901 Long term (current) use of anticoagulants: Secondary | ICD-10-CM | POA: Insufficient documentation

## 2024-09-03 DIAGNOSIS — I442 Atrioventricular block, complete: Secondary | ICD-10-CM | POA: Diagnosis not present

## 2024-09-03 DIAGNOSIS — I10 Essential (primary) hypertension: Secondary | ICD-10-CM | POA: Insufficient documentation

## 2024-09-03 LAB — CUP PACEART INCLINIC DEVICE CHECK
Date Time Interrogation Session: 20250909103433
Implantable Lead Connection Status: 753985
Implantable Lead Connection Status: 753985
Implantable Lead Implant Date: 20230106
Implantable Lead Implant Date: 20230106
Implantable Lead Location: 753858
Implantable Lead Location: 753859
Implantable Lead Model: 377171
Implantable Lead Model: 377171
Implantable Lead Serial Number: 8000603602
Implantable Lead Serial Number: 8000650012
Implantable Pulse Generator Implant Date: 20230106
Pulse Gen Model: 407145
Pulse Gen Serial Number: 70291848

## 2024-09-03 NOTE — Patient Instructions (Signed)
 Medication Instructions:  Your physician recommends that you continue on your current medications as directed. Please refer to the Current Medication list given to you today.  *If you need a refill on your cardiac medications before your next appointment, please call your pharmacy*  Lab Work: None ordered If you have labs (blood work) drawn today and your tests are completely normal, you will receive your results only by: MyChart Message (if you have MyChart) OR A paper copy in the mail If you have any lab test that is abnormal or we need to change your treatment, we will call you to review the results.  Follow-Up: At [facility], you and your health needs are our priority.  As part of our continuing mission to provide you with exceptional heart care, our providers are all part of one team.  This team includes your primary Cardiologist (physician) and Advanced Practice Providers or APPs (Physician Assistants and Nurse Practitioners) who all work together to provide you with the care you need, when you need it.  Your next appointment:   2 year(s)  Provider:   Ole Holts, MD

## 2024-09-06 ENCOUNTER — Ambulatory Visit: Payer: Self-pay | Admitting: Cardiology

## 2024-09-12 ENCOUNTER — Encounter: Payer: Self-pay | Admitting: Family Medicine

## 2024-09-13 ENCOUNTER — Ambulatory Visit

## 2024-09-13 DIAGNOSIS — Z7901 Long term (current) use of anticoagulants: Secondary | ICD-10-CM

## 2024-09-13 LAB — POCT INR: INR: 2 (ref 2.0–3.0)

## 2024-09-13 NOTE — Patient Instructions (Addendum)
 Pre visit review using our clinic review tool, if applicable. No additional management support is needed unless otherwise documented below in the visit note.  Continue 1 tablet daily except 2 tablets on Wednesday. Recheck INR in 4 weeks. Phone number to the [place] anticoagulation clinic is [phone].

## 2024-09-13 NOTE — Progress Notes (Signed)
 Continue 1 tablet daily except 2 tablets on Wednesday. Recheck INR in 4 weeks. Phone number to the  anticoagulation clinic is [phone].

## 2024-09-16 DIAGNOSIS — M8589 Other specified disorders of bone density and structure, multiple sites: Secondary | ICD-10-CM | POA: Diagnosis not present

## 2024-09-16 LAB — HM DEXA SCAN

## 2024-09-18 ENCOUNTER — Encounter: Payer: Self-pay | Admitting: Family Medicine

## 2024-09-23 ENCOUNTER — Ambulatory Visit: Admitting: Primary Care

## 2024-09-23 ENCOUNTER — Encounter: Payer: Self-pay | Admitting: Primary Care

## 2024-09-23 VITALS — BP 124/70 | HR 72 | Temp 97.3°F | Ht 65.0 in | Wt 157.8 lb

## 2024-09-23 DIAGNOSIS — G4733 Obstructive sleep apnea (adult) (pediatric): Secondary | ICD-10-CM

## 2024-09-23 NOTE — Progress Notes (Signed)
 @Patient  ID: Adriana Hendricks, female    DOB: Dec 29, 1934, 89 y.o.   MRN: [id number]  No chief complaint on file.   Referring provider: Katrinka Garnette KIDD, MD  HPI: 89 year old female, former smoker. PMH significant for HTN, afib, OSA. Former patient of Dr. Shellia.   09/23/2024 Discussed the use of AI scribe software for clinical note transcription with the patient, who gave verbal consent to proceed.  History of Present Illness Adriana Hendricks is an 89 year old female with moderate sleep apnea who presents for a one-year follow-up and CPAP compliance check.  She was diagnosed with moderate sleep apnea following a sleep study in December 2017, which showed an average of 19.5 apneic events per hour. She has been using CPAP therapy since then and is currently on her second machine, which she received a few months ago. Her current CPAP settings are on auto set with a minimum pressure of 5 and a maximum pressure of 8. Her apnea-hypopnea index (AHI) is 0.9 events per hour. She uses the CPAP machine 100% of the time, averaging 8 hours and 26 minutes of use per night.  She sleeps well with the CPAP, although she experiences nocturia, which interrupts her sleep. No significant daytime sleepiness, although she occasionally falls asleep after lunch, which she attributes to her age. She uses a cushion mask that fits under her nose and does not use a chin strap, as it did not work for her.      Allergies  Allergen Reactions   Levofloxacin  Other (See Comments)    Developed tendon pain. (Has a history of a Achilles tendon tear)   Potassium Chloride  Other (See Comments)    Can't take tables   Clarithromycin Nausea Only   Erythromycin Base Nausea And Vomiting   Metronidazole Rash   Penicillins Rash and Other (See Comments)    Did it involve swelling of the face/tongue/throat, SOB, or low BP? No Did it involve sudden or severe rash/hives, skin peeling, or any reaction on the inside of your mouth or  nose? No Did you need to seek medical attention at a hospital or doctor's office? No When did it last happen?  as a child If all above answers are NO, may proceed with cephalosporin use.     Immunization History  Administered Date(s) Administered   Fluad Quad(high Dose 65+) 10/08/2019, 10/12/2020, 10/13/2021   INFLUENZA, HIGH DOSE SEASONAL PF 10/01/2014, 11/10/2015, 10/25/2016, 10/30/2017, 10/22/2018, 10/14/2022, 10/10/2023   Influenza Split 12/01/2011, 11/05/2012   Influenza Whole 01/07/2010, 10/08/2010   Influenza,inj,Quad PF,6+ Mos 08/23/2013   PFIZER Comirnaty(Gray Top)Covid-19 Tri-Sucrose Vaccine 09/30/2022   PFIZER(Purple Top)SARS-COV-2 Vaccination 01/04/2020, 01/25/2020, 09/22/2020, 04/08/2021   Pfizer Covid-19 Vaccine Bivalent Booster 61yrs & up 09/09/2021   Pfizer(Comirnaty)Fall Seasonal Vaccine 12 years and older 11/14/2023   Pneumococcal Conjugate-13 04/13/2015   Pneumococcal Polysaccharide-23 12/26/2005   Respiratory Syncytial Virus Vaccine,Recomb Aduvanted(Arexvy) 11/07/2022   Td 12/26/2001   Tdap 01/23/2012, 12/11/2023   Zoster Recombinant(Shingrix) 12/02/2020, 05/21/2021   Zoster, Live 02/08/2010    Past Medical History:  Diagnosis Date   Achilles tendon rupture    Arthritis of hand, right    Atrial flutter with rapid ventricular response (HCC)    Carpal tunnel syndrome 09/04/2013   Collagenous colitis 2005   Colon polyps 2010   Tubular adenoma and hyperplastic   Dental infection 10/25/2014   Diverticulosis    Esophageal stenosis    Essential tremor    GERD (gastroesophageal reflux disease)    hx  hiatal hernia, hx esophagitis, hx stricture   Hiatal hernia    Hypertension    Lower extremity neuropathy 08/23/2013   On B12 therapy with numbness in the feet bilaterally no evidence of diabetes    Ocular migraine    jagged vision, a few per month   OSA (obstructive sleep apnea) 12/21/2016   on CPAP   Peripheral neuropathy    treated by Dr Tobie (07/2015)    Persistent atrial fibrillation (HCC)    Rate control with beta-blocker and anticoagulated with warfarin   Pleural effusion, left    S/P Maze operation for atrial fibrillation 06/12/2019   Complete bilateral atrial lesion set using cryothermy and bipolar radiofrequency ablation with clipping of LA appendage via right mini thoracotomy approach   S/P minimally invasive tricuspid valve repair 06/12/2019   Complex valvuloplasty including autologous pericardial patch augmentation of anterior and posterior leaflets with 28 mm Edwards mc3 ring annuloplasty via right mini thoracotomy approach   Severe tricuspid regurgitation 07/2018   Noted Aug 2019 during a fib workup. Severe LAE as well   Sinus node dysfunction (HCC)     Tobacco History: Social History   Tobacco Use  Smoking Status Former   Current packs/day: 0.00   Average packs/day: 2.0 packs/day for 13.2 years (26.5 ttl pk-yrs)   Types: Cigarettes   Start date: 12/26/1954   Quit date: 03/26/1968   Years since quitting: 56.5  Smokeless Tobacco Never  Tobacco Comments   Quit in 1969 - smokeed 2-3PPD , was 3 PPD at her heaviest for about 3 years.    Counseling given: Not Answered Tobacco comments: Quit in 1969 - smokeed 2-3PPD , was 3 PPD at her heaviest for about 3 years.    Outpatient Medications Prior to Visit  Medication Sig Dispense Refill   amoxicillin  (AMOXIL ) 500 MG capsule TAKE 4 CAPSULES (2000 MG) BY MOUTH FOR ONE DOSE 1 HOUR PRIOR TO DENTAL PROCEDURE. 4 capsule 2   B Complex Vitamins (VITAMIN B COMPLEX) TABS Take 1 tablet by mouth daily.     calcium carbonate (TUMS - DOSED IN MG ELEMENTAL CALCIUM) 500 MG chewable tablet Chew 1 tablet by mouth daily.      cholecalciferol (VITAMIN D3) 25 MCG (1000 UT) tablet Take 1,000 Units by mouth daily.     famotidine  (PEPCID ) 20 MG tablet Take 20 mg by mouth at bedtime.     furosemide  (LASIX ) 20 MG tablet TAKE 1 TABLET (20 MG TOTAL) BY MOUTH ONCE DAILY. MAY TAKE AN ADDITIONAL 20 MG IF NEEDED  FOR SWELLING OR SHORTNESS OF BREATH. 110 tablet 0   metoprolol  tartrate (LOPRESSOR ) 25 MG tablet Take 1 tablet (25 mg total) by mouth as needed. 20 tablet 6   Multiple Vitamins-Minerals (PRESERVISION AREDS 2 PO) Take 1 capsule by mouth in the morning and at bedtime.     nadolol  (CORGARD ) 20 MG tablet TAKE 1 TABLET BY MOUTH ONCE DAILY 90 tablet 2   potassium chloride  20 MEQ/15ML (10%) SOLN TAKE 15 ML BY MOUTH ONCE DAILY *DILUTE WITH 4-8 OUNCES OF LIQUID* *TAKE WITH FOOD* 15 mL 0   tizanidine  (ZANAFLEX ) 2 MG capsule Take 1 capsule (2 mg total) by mouth 2 (two) times daily as needed for muscle spasms (dont drive for 8 hours after taking). 30 capsule 0   TURMERIC PO Take 500 mg by mouth daily.     warfarin (COUMADIN ) 2.5 MG tablet TAKE 1 TO 2 TABLETS BY MOUTH ONCE DAILY OR AS INSTRUCTED BY COUMADIN  CLINIC *HAZARDOUS DRUG: WEAR  GLOVES* *DO NOT CRUSH* 120 tablet 10   No facility-administered medications prior to visit.   Review of Systems  Review of Systems  Constitutional: Negative.   Respiratory: Negative.    Psychiatric/Behavioral:  Negative for sleep disturbance.      Physical Exam  LMP  (LMP Unknown)  Physical Exam Constitutional:      Appearance: Normal appearance.  HENT:     Head: Normocephalic and atraumatic.  Cardiovascular:     Rate and Rhythm: Normal rate and regular rhythm.  Pulmonary:     Effort: Pulmonary effort is normal.     Breath sounds: Normal breath sounds.  Musculoskeletal:        General: Normal range of motion.  Skin:    General: Skin is warm and dry.  Neurological:     General: No focal deficit present.     Mental Status: She is alert and oriented to person, place, and time. Mental status is at baseline.  Psychiatric:        Mood and Affect: Mood normal.        Behavior: Behavior normal.        Thought Content: Thought content normal.        Judgment: Judgment normal.     Lab Results:  CBC    Component Value Date/Time   WBC 6.6 11/13/2023 1052    RBC 4.39 11/13/2023 1052   HGB 14.1 11/13/2023 1052   HGB 13.9 12/29/2021 1442   HCT 42.1 11/13/2023 1052   HCT 40.8 12/29/2021 1442   PLT 156.0 11/13/2023 1052   PLT 191 12/29/2021 1442   MCV 96.1 11/13/2023 1052   MCV 95 12/29/2021 1442   MCH 32.5 12/29/2021 1442   MCH 28.9 07/25/2019 1458   MCHC 33.6 11/13/2023 1052   RDW 13.4 11/13/2023 1052   RDW 13.1 12/29/2021 1442   LYMPHSABS 0.9 11/13/2023 1052   LYMPHSABS 1.3 12/29/2021 1442   MONOABS 0.5 11/13/2023 1052   EOSABS 0.1 11/13/2023 1052   EOSABS 0.1 12/29/2021 1442   BASOSABS 0.0 11/13/2023 1052   BASOSABS 0.0 12/29/2021 1442    BMET    Component Value Date/Time   NA 137 05/15/2024 1018   NA 141 12/29/2021 1442   K 3.8 05/15/2024 1018   CL 100 05/15/2024 1018   CO2 28 05/15/2024 1018   GLUCOSE 96 05/15/2024 1018   BUN 25 (H) 05/15/2024 1018   BUN 27 12/29/2021 1442   CREATININE 0.88 05/15/2024 1018   CALCIUM 9.3 05/15/2024 1018   GFRNONAA 61 01/03/2020 1430   GFRAA 70 01/03/2020 1430    BNP No results found for: BNP  ProBNP No results found for: PROBNP  Imaging: CUP PACEART INCLINIC DEVICE CHECK Result Date: 09/03/2024 Normal in-clinic dual chamber pacemaker check. Presenting Rhythm: AP-VS. Routine testing of thresholds, sensing, and impedance demonstrate stable parameters. Threshold has trended up on Auto-capture. Treshold 0.9V @ 0.5 ms today, so changed to monitor with significant improvement in battery life. No episodes. Estimated longevity _8 years. Pt enrolled in remote follow-up. See attached sheets.   Two reports - Reconnected to adjust pulse width.    Assessment & Plan:   1. OSA (obstructive sleep apnea) (Primary)  Assessment and Plan Assessment & Plan Obstructive sleep apnea Obstructive sleep apnea is well-controlled with CPAP therapy. She has been using CPAP for approximately eight years, currently on her second machine which was replaced this year. Recent download shows 100% usage with an  average of 8 hours and 26 minutes per night.  Current settings are on auto set with a minimum pressure of 5 and maximum pressure of 8. Apnea-hypopnea index (AHI) is 0.9 events per hour, indicating effective control of sleep apnea. No residual breakthrough events reported. She reports feeling well and benefiting from CPAP therapy, with no significant daytime sleepiness. - Continue current CPAP therapy with settings on auto set (minimum pressure 5, maximum pressure 8). - Renew CPAP supplies with [employer]  - Schedule follow-up in one year.   Adriana LELON Ferrari, NP 09/23/2024

## 2024-09-23 NOTE — Patient Instructions (Addendum)
  VISIT SUMMARY: You came in today for your one-year follow-up and CPAP compliance check. You have been using CPAP therapy for your moderate sleep apnea for about eight years, and you are currently on your second machine. Your current CPAP settings are working well, and you are using the machine consistently every night.  YOUR PLAN: -OBSTRUCTIVE SLEEP APNEA ON CPAP THERAPY: Obstructive sleep apnea is a condition where your breathing stops and starts repeatedly during sleep. Your CPAP therapy is effectively controlling your sleep apnea, as shown by your low apnea-hypopnea index (AHI) of 0.9 events per hour. You should continue using your CPAP machine with the current settings (minimum pressure 5, maximum pressure 8).  INSTRUCTIONS: Please schedule a follow-up appointment in one year to check on your CPAP therapy and overall sleep health.  Orders: Renew CPAP supplies x 1 year  Follow-up 1 year with Landry NP

## 2024-09-30 ENCOUNTER — Ambulatory Visit: Payer: Medicare Other

## 2024-09-30 DIAGNOSIS — I4819 Other persistent atrial fibrillation: Secondary | ICD-10-CM

## 2024-10-02 LAB — CUP PACEART REMOTE DEVICE CHECK
Battery Voltage: 80
Date Time Interrogation Session: 20251006112427
Implantable Lead Connection Status: 753985
Implantable Lead Connection Status: 753985
Implantable Lead Implant Date: 20230106
Implantable Lead Implant Date: 20230106
Implantable Lead Location: 753858
Implantable Lead Location: 753859
Implantable Lead Model: 377171
Implantable Lead Model: 377171
Implantable Lead Serial Number: 8000603602
Implantable Lead Serial Number: 8000650012
Implantable Pulse Generator Implant Date: 20230106
Pulse Gen Model: 407145
Pulse Gen Serial Number: 70291848

## 2024-10-03 NOTE — Progress Notes (Signed)
 Remote PPM Transmission

## 2024-10-07 ENCOUNTER — Ambulatory Visit: Payer: Self-pay | Admitting: Cardiology

## 2024-10-07 NOTE — Progress Notes (Signed)
 Remote PPM Transmission

## 2024-10-11 ENCOUNTER — Ambulatory Visit (INDEPENDENT_AMBULATORY_CARE_PROVIDER_SITE_OTHER)

## 2024-10-11 DIAGNOSIS — Z7901 Long term (current) use of anticoagulants: Secondary | ICD-10-CM

## 2024-10-11 LAB — POCT INR: INR: 2.7 (ref 2.0–3.0)

## 2024-10-11 NOTE — Progress Notes (Signed)
 Indication: persistent Afib Continue 1 tablet daily except 2 tablets on Wednesday. Recheck INR in 4 weeks. Phone number to the [place] [facility] clinic is [phone].

## 2024-10-11 NOTE — Patient Instructions (Addendum)
 Pre visit review using our clinic review tool, if applicable. No additional management support is needed unless otherwise documented below in the visit note.  Continue 1 tablet daily except 2 tablets on Wednesday. Recheck INR in 4 weeks. Phone number to the [place] anticoagulation clinic is [phone].

## 2024-10-23 ENCOUNTER — Ambulatory Visit: Admitting: Cardiology

## 2024-10-28 ENCOUNTER — Other Ambulatory Visit: Payer: Self-pay | Admitting: Cardiology

## 2024-10-29 ENCOUNTER — Other Ambulatory Visit: Payer: Self-pay | Admitting: Cardiology

## 2024-11-07 DIAGNOSIS — R2681 Unsteadiness on feet: Secondary | ICD-10-CM | POA: Diagnosis not present

## 2024-11-07 DIAGNOSIS — R2689 Other abnormalities of gait and mobility: Secondary | ICD-10-CM | POA: Diagnosis not present

## 2024-11-07 DIAGNOSIS — H81392 Other peripheral vertigo, left ear: Secondary | ICD-10-CM | POA: Diagnosis not present

## 2024-11-08 ENCOUNTER — Ambulatory Visit

## 2024-11-08 DIAGNOSIS — R2689 Other abnormalities of gait and mobility: Secondary | ICD-10-CM | POA: Diagnosis not present

## 2024-11-08 DIAGNOSIS — Z7901 Long term (current) use of anticoagulants: Secondary | ICD-10-CM | POA: Diagnosis not present

## 2024-11-08 DIAGNOSIS — R2681 Unsteadiness on feet: Secondary | ICD-10-CM | POA: Diagnosis not present

## 2024-11-08 DIAGNOSIS — H81392 Other peripheral vertigo, left ear: Secondary | ICD-10-CM | POA: Diagnosis not present

## 2024-11-08 LAB — POCT INR: INR: 2.1 (ref 2.0–3.0)

## 2024-11-08 NOTE — Progress Notes (Signed)
 Indication: persistent Afib Pt has been having some vertigo for the last 10 days. She is currently working with PT for this. They report it is caused by her inner ear. Continue 1 tablet daily except 2 tablets on Wednesday. Recheck INR in 4 weeks. Phone number to the [place] [facility] clinic is [phone].

## 2024-11-08 NOTE — Patient Instructions (Addendum)
 Pre visit review using our clinic review tool, if applicable. No additional management support is needed unless otherwise documented below in the visit note.  Continue 1 tablet daily except 2 tablets on Wednesday. Recheck INR in 4 weeks. Phone number to the [place] anticoagulation clinic is [phone].

## 2024-11-18 ENCOUNTER — Ambulatory Visit: Payer: Self-pay | Admitting: Family Medicine

## 2024-11-18 ENCOUNTER — Ambulatory Visit (INDEPENDENT_AMBULATORY_CARE_PROVIDER_SITE_OTHER): Admitting: Family Medicine

## 2024-11-18 ENCOUNTER — Encounter: Payer: Self-pay | Admitting: Family Medicine

## 2024-11-18 VITALS — BP 138/76 | HR 76 | Temp 97.7°F | Ht 65.0 in | Wt 157.4 lb

## 2024-11-18 DIAGNOSIS — R42 Dizziness and giddiness: Secondary | ICD-10-CM

## 2024-11-18 DIAGNOSIS — E785 Hyperlipidemia, unspecified: Secondary | ICD-10-CM | POA: Diagnosis not present

## 2024-11-18 DIAGNOSIS — R739 Hyperglycemia, unspecified: Secondary | ICD-10-CM | POA: Diagnosis not present

## 2024-11-18 DIAGNOSIS — I10 Essential (primary) hypertension: Secondary | ICD-10-CM | POA: Diagnosis not present

## 2024-11-18 DIAGNOSIS — Z131 Encounter for screening for diabetes mellitus: Secondary | ICD-10-CM

## 2024-11-18 LAB — CBC WITH DIFFERENTIAL/PLATELET
Basophils Absolute: 0 K/uL (ref 0.0–0.1)
Basophils Relative: 0.5 % (ref 0.0–3.0)
Eosinophils Absolute: 0.1 K/uL (ref 0.0–0.7)
Eosinophils Relative: 1.2 % (ref 0.0–5.0)
HCT: 42.6 % (ref 36.0–46.0)
Hemoglobin: 14.4 g/dL (ref 12.0–15.0)
Lymphocytes Relative: 13.9 % (ref 12.0–46.0)
Lymphs Abs: 1 K/uL (ref 0.7–4.0)
MCHC: 33.9 g/dL (ref 30.0–36.0)
MCV: 95.5 fl (ref 78.0–100.0)
Monocytes Absolute: 0.6 K/uL (ref 0.1–1.0)
Monocytes Relative: 8.9 % (ref 3.0–12.0)
Neutro Abs: 5.4 K/uL (ref 1.4–7.7)
Neutrophils Relative %: 75.5 % (ref 43.0–77.0)
Platelets: 172 K/uL (ref 150.0–400.0)
RBC: 4.46 Mil/uL (ref 3.87–5.11)
RDW: 12.8 % (ref 11.5–15.5)
WBC: 7.1 K/uL (ref 4.0–10.5)

## 2024-11-18 LAB — COMPREHENSIVE METABOLIC PANEL WITH GFR
ALT: 20 U/L (ref 0–35)
AST: 21 U/L (ref 0–37)
Albumin: 4.7 g/dL (ref 3.5–5.2)
Alkaline Phosphatase: 95 U/L (ref 39–117)
BUN: 15 mg/dL (ref 6–23)
CO2: 31 meq/L (ref 19–32)
Calcium: 9.7 mg/dL (ref 8.4–10.5)
Chloride: 98 meq/L (ref 96–112)
Creatinine, Ser: 0.85 mg/dL (ref 0.40–1.20)
GFR: 61.63 mL/min (ref >60.00)
Glucose, Bld: 95 mg/dL (ref 70–99)
Potassium: 4.3 meq/L (ref 3.5–5.1)
Sodium: 138 meq/L (ref 135–145)
Total Bilirubin: 1.4 mg/dL — ABNORMAL HIGH (ref 0.2–1.2)
Total Protein: 7.5 g/dL (ref 6.0–8.3)

## 2024-11-18 LAB — LIPID PANEL
Cholesterol: 208 mg/dL — ABNORMAL HIGH (ref 0–200)
HDL: 82.1 mg/dL (ref >39.00)
LDL Cholesterol: 99 mg/dL (ref 0–99)
NonHDL: 125.51
Total CHOL/HDL Ratio: 3
Triglycerides: 133 mg/dL (ref 0.0–149.0)
VLDL: 26.6 mg/dL (ref 0.0–40.0)

## 2024-11-18 LAB — HEMOGLOBIN A1C: Hgb A1c MFr Bld: 5.9 % (ref 4.6–6.5)

## 2024-11-18 NOTE — Patient Instructions (Addendum)
 1 month of symptoms of dizziness despite prior vertigo which has resolved with therapy and Epley maneuver maneuvers. She even at rest feels dizzy. Neurology exam reassuring. We opted to get brain MRI to be cautious- her pacemaker on note from 01/12/22 states that it is MRI compatible but she is going of the check with company to be sure.    We will call you within two weeks about your referral to MRI brain through [facility].  Their phone number is [phone].  Please call them if you have not heard in 1-2 weeks  -offered gait and balance training with physical therapy but she will message me if she decides to do that    Please stop by lab before you go If you have mychart- we will send your results within 3 business days of us  receiving them.  If you do not have mychart- we will call you about results within 5 business days of us  receiving them.  *please also note that you will see labs on mychart as soon as they post. I will later go in and write notes on them- will say notes from Dr. Katrinka   Recommended follow up: Return in about 6 months (around 05/18/2025) for followup or sooner if needed.Schedule b4 you leave.

## 2024-11-18 NOTE — Progress Notes (Signed)
 Phone [phone] In person visit   Subjective:   Adriana Hendricks is a 89 y.o. year old very pleasant female patient who presents for/with See problem oriented charting Chief Complaint  Patient presents with   Medical Management of Chronic Issues    Her for a 6 month follow up. Still feeling light headed after the last fall.     Past Medical History-  Patient Active Problem List   Diagnosis Date Noted   Sinus node dysfunction (HCC)     Priority: High   S/P minimally invasive tricuspid valve repair 06/12/2019    Priority: High   S/P Maze operation for atrial fibrillation 06/12/2019    Priority: High   Rheumatic tricuspid valve regurgitation     Priority: High   Persistent atrial fibrillation (HCC)     Priority: High   Osteopenia 03/13/2017    Priority: High   Hyperglycemia 10/26/2021    Priority: Medium    Bicuspid aortic valve 01/28/2019    Priority: Medium    DOE (dyspnea on exertion) 07/26/2018    Priority: Medium    Ocular migraine 03/13/2017    Priority: Medium    OSA (obstructive sleep apnea) 12/21/2016    Priority: Medium    Lower extremity neuropathy 08/23/2013    Priority: Medium    Hyperlipidemia with target LDL less than 100 01/07/2010    Priority: Medium    GERD with stricture 06/19/2009    Priority: Medium    Essential tremor 11/16/2007    Priority: Medium    Essential hypertension 11/16/2007    Priority: Medium    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    Priority: Low   Tricuspid valve insufficiency     Priority: Low   Long term (current) use of anticoagulants: CHA2DS2Vasc 5, WARFARIN - Rheumatic Vavle Disease 08/28/2018    Priority: Low   IBS (irritable bowel syndrome) 09/12/2016    Priority: Low   History of adenomatous polyp of colon 09/12/2016    Priority: Low   Former smoker 09/12/2016    Priority: Low   Carpal tunnel syndrome 09/04/2013    Priority: Low   Achilles tendon tear 01/17/2011    Priority: Low   ACNE ROSACEA 04/03/2008     Priority: Low   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     calcium carbonate (TUMS - DOSED IN MG ELEMENTAL CALCIUM) 500 MG chewable tablet Chew 1 tablet by mouth daily.      cholecalciferol (VITAMIN D3) 25 MCG (1000 UT) tablet Take 1,000 Units by mouth daily.     famotidine  (PEPCID ) 20 MG tablet Take 20 mg by mouth at bedtime.     furosemide  (LASIX ) 20 MG tablet TAKE 1 TABLET BY MOUTH (20 MG TOTAL) ONCE DAILY. MAY TAKE AN ADDITIONAL 20 MG IF NEEDED FOR SWELLING OR SHORTNESS OF BREATH 110 tablet 0   metoprolol  tartrate (LOPRESSOR ) 25 MG tablet Take 1 tablet (25 mg total) by mouth as needed. 20 tablet 6   Multiple Vitamins-Minerals (PRESERVISION AREDS 2 PO) Take 1 capsule by mouth in the morning and at bedtime.     nadolol  (CORGARD ) 20 MG tablet TAKE 1 TABLET BY MOUTH ONCE DAILY 90 tablet 2  potassium chloride  20 MEQ/15ML (10%) SOLN TAKE 15 ML BY MOUTH ONCE DAILY *DILUTE WITH 4-8 OUNCES OF LIQUID* *TAKE WITH FOOD* 15 mL 0   tizanidine  (ZANAFLEX ) 2 MG capsule Take 1 capsule (2 mg total) by mouth 2 (two) times daily as needed for muscle spasms (dont drive for 8 hours after taking). 30 capsule 0   TURMERIC PO Take 500 mg by mouth daily.     warfarin (COUMADIN ) 2.5 MG tablet TAKE 1 TO 2 TABLETS BY MOUTH ONCE DAILY OR AS INSTRUCTED BY COUMADIN  CLINIC *HAZARDOUS DRUG: WEAR GLOVES* *DO NOT CRUSH* 120 tablet 10   No current facility-administered medications for this visit.     Objective:  BP 138/76   Pulse 76   Temp 97.7 F (36.5 C) (Temporal)   Ht 5' 5 (1.651 m)   Wt 157 lb 6.4 oz (71.4 kg)   LMP  (LMP Unknown)   SpO2 98%   BMI 26.19 kg/m  Gen: NAD, resting comfortably CV: RRR no murmurs  rubs or gallops Lungs: CTAB no crackles, wheeze, rhonchi Abdomen: soft/nontender/nondistended/normal bowel sounds. No rebound or guarding.  Ext: trace  edema Skin: warm, dry Neuro: CN II-XII intact, sensation and reflexes normal throughout, 5/5 muscle strength in bilateral upper and lower extremities. Normal finger to nose other than known tremor. Normal rapid alternating movements. No pronator drift. Normal romberg. Normal gait other than walks with cane and has known peripheral neuropathy     Assessment and Plan   # Lightheadedness S: Patient reports had a fall earlier this month - she was washing a sheet and felt sudden spinning motion- yelled out and husband came to help- did not hit head- some bruise on hip but healed.   -no palpitations and does not feel heart related plus has her pacemaker that is remotely monitored and no issues  Was seen by physical therapy and was told BENIGN PAROXYSMAL POSITIONAL VERTIGO (BPPV) related with positive dix hallpike maneuver. Did therapy on campus and was sent home with Epley maneuver maneuvers for left side. Slept in recliner to see if that woud help.   No longer having spinning but feeling dizzy more regularly- balance does not feel as secure. Using cane regular. No more falls. No more spinning. Constant dizziness.  A/P: 1 month of symptoms of dizziness despite prior vertigo which has resolved with therapy and Epley maneuver maneuvers. She even at rest feels dizzy. Neurology exam reassuring. We opted to get brain MRI to be cautious- her pacemaker on note from 01/12/22 states that it is MRI compatible but she is going of the check with company to be sure.   -offered gait and balance training with physical therapy but she will message me if she decides to do that    # Atrial fibrillation-follows with Dr. Anner S: Rate controlled with nadolol  20 mg daily.  Has metoprolol  available if needed- not needing Anticoagulated with Coumadin  monitored by our  clinic -a fib maze/tricuspid valve replacement late June 12, 2019 hospitalization-has appeared to stay out of A-fib since that time A/P: has seemed to stay out of  a fib- appropriately anticoagulated and has rate control if needed- continue to monitor and continue current medications    #Complete heart block/sinus node dysfunction S: Permanent pacemaker implant December 31, 2021 -Patient prior to this had multiple syncopal episodes arrhythmic in origin A/P: has done well with pacemaker- continue to monitor    % Status post minimally invasive tricuspid valve repair (related to rheumatism)-still with moderate to severe tricuspid  regurgitation on 03/30/2022 echocardiogram.  Also with thickening of aortic valve without significant narrowing/stenosis- likely will have another echocardiogram with cardiology   #hypertension and essential tremor S: medication: Lasix  20 mg daily- extra dose as needed for edema, nadolol  20 mg daily  A/P: blood pressure reasonable control today and hasn't taken her lasix  yet- continue to monitor as seems reasonably controlled   #hyperlipidemia-over age 20 does not want to add medicine for primary prevention S: Medication:none  Lab Results  Component Value Date   CHOL 214 (H) 11/13/2023   HDL 68.20 11/13/2023   LDLCALC 125 (H) 11/13/2023   LDLDIRECT 128.8 01/17/2011   TRIG 107.0 11/13/2023   CHOLHDL 3 11/13/2023  A/P: lipids above goal but no history heart attack or stroke- continue without medications and update lipids today- did discuss if MRI shows stroke history we may need to change this plan   # Hyperglycemia/insulin  resistance/prediabetes-A1c 5.01 September 2021 S:  Medication: none Exercise and diet- not right now with dizziness outside 5000 setps per day  Lab Results  Component Value Date   HGBA1C 5.8 05/15/2024   HGBA1C 6.0 11/13/2023   HGBA1C 5.9 05/10/2023  A/P: hopefully stable- update a1c today. Continue without meds for now   # GERD with history of  stricture S:Medication: Famotidine  20 mg down to once a day before bed and helpful still  A/P: doing well- continue current medications     #OSA- using her CPAP through Dr. Shellia- but has been sleeping sitting up  #compression stockings- wearing for varicosities- gets some pain at night if not    #Ocular migraine- none in last month. Prevoiusly we had opted out of MRI but with ongoing dizziness updating now  # Osteopenia-noted 05/12/2022 but roughly stable from 2021-opted to repeat in 2 years- again noted in 2025 but roughly stable   Recommended follow up: Return in about 6 months (around 05/18/2025) for followup or sooner if needed.Schedule b4 you leave. Future Appointments  Date Time Provider Department Center  12/06/2024  9:30 AM [facility]  CLINIC [facility]  12/30/2024  7:05 AM [facility] DEVICE REMOTES [facility]  12/31/2024  3:20 PM Anner Alm ORN, MD [facility]  03/31/2025  7:05 AM [facility] DEVICE REMOTES [facility]  06/30/2025  7:05 AM [facility] DEVICE REMOTES [facility]  09/29/2025  7:05 AM [facility] DEVICE REMOTES [facility]  12/29/2025  7:05 AM [facility] DEVICE REMOTES [facility]  03/30/2026  7:05 AM [facility] DEVICE REMOTES [facility]  06/29/2026  7:05 AM [facility] DEVICE REMOTES [facility]    Lab/Order associations:   ICD-10-CM   1. Dizziness  R42 MR Brain Wo Contrast    2. Essential hypertension  I10 Comprehensive metabolic panel with GFR    Lipid panel    CBC with Differential/Platelet    3. Hyperglycemia  R73.9 Hemoglobin A1c    4. Screening for diabetes mellitus  Z13.1 Hemoglobin A1c    5. Hyperlipidemia with target LDL less than 100  E78.5 Comprehensive metabolic panel with GFR    Lipid panel    CBC with Differential/Platelet      No orders of the defined types were placed in this encounter.   Return precautions advised.  Garnette Lukes, MD

## 2024-11-26 ENCOUNTER — Other Ambulatory Visit: Payer: Self-pay | Admitting: Cardiology

## 2024-11-28 ENCOUNTER — Telehealth: Payer: Self-pay | Admitting: Cardiology

## 2024-11-28 MED ORDER — POTASSIUM CHLORIDE 20 MEQ/15ML (10%) PO SOLN: ORAL | 0 refills | Status: DC

## 2024-11-28 NOTE — Telephone Encounter (Signed)
 Pt's medication was sent to pt's pharmacy as requested. Confirmation received.

## 2024-11-28 NOTE — Telephone Encounter (Signed)
 Call was taken at 135pm today and pharmacy was already closed.

## 2024-11-28 NOTE — Telephone Encounter (Signed)
 Pt c/o medication issue:  1. Name of Medication:   potassium chloride  20 MEQ/15ML (10%) SOLN   2. How are you currently taking this medication (dosage and times per day)?   3. Are you having a reaction (difficulty breathing--STAT)?   4. What is your medication issue?   Caller Eldon) wants a call back to confirm the quantity of the this medication.  Caller noted the pharmacy is only open 3 days a week and will be closing at 1:30 pm.

## 2024-11-28 NOTE — Telephone Encounter (Signed)
*  STAT* If patient is at the pharmacy, call can be transferred to refill team.   1. Which medications need to be refilled? (please list name of each medication and dose if known)   potassium chloride  20 MEQ/15ML (10%) SOLN    2. Which pharmacy/location (including street and city if local pharmacy) is medication to be sent to?  [facility] - [place], [place] - [address]      3. Do they need a 30 day or 90 day supply? 90 day

## 2024-11-29 NOTE — Telephone Encounter (Signed)
 Left message to call back. Pharmacy is closed on Fridays.

## 2024-12-02 MED ORDER — POTASSIUM CHLORIDE 20 MEQ/15ML (10%) PO SOLN: ORAL | 3 refills | Status: AC

## 2024-12-02 NOTE — Telephone Encounter (Signed)
 Spoke with pt pharmacy, prescription for potassium updated.

## 2024-12-02 NOTE — Addendum Note (Signed)
 Addended by: Gail Vendetti W on: 12/02/2024 09:39 AM   Modules accepted: Orders

## 2024-12-02 NOTE — Telephone Encounter (Signed)
 Pharmacy called in stating pt needs either a new prescription or a verbal for the quantity to fill. She states the original rx sent was only enough for one day and pt appt is in January. Please advise. She states her VM is secure.

## 2024-12-03 ENCOUNTER — Encounter: Payer: Self-pay | Admitting: Family Medicine

## 2024-12-03 NOTE — Addendum Note (Signed)
 Addended by: KATRINKA GARNETTE KIDD on: 12/03/2024 12:22 PM   Modules accepted: Orders

## 2024-12-06 ENCOUNTER — Ambulatory Visit

## 2024-12-06 DIAGNOSIS — Z7901 Long term (current) use of anticoagulants: Secondary | ICD-10-CM | POA: Diagnosis not present

## 2024-12-06 LAB — POCT INR: INR: 2.2 (ref 2.0–3.0)

## 2024-12-06 NOTE — Progress Notes (Signed)
 Indication: persistent Afib Continue 1 tablet daily except 2 tablets on Wednesday. Recheck INR in 4 weeks. Phone number to the [place] [facility] clinic is [phone].

## 2024-12-06 NOTE — Patient Instructions (Addendum)
 Pre visit review using our clinic review tool, if applicable. No additional management support is needed unless otherwise documented below in the visit note.  Continue 1 tablet daily except 2 tablets on Wednesday. Recheck INR in 4 weeks. Phone number to the [place] anticoagulation clinic is [phone].

## 2024-12-27 NOTE — CV Procedure (Signed)
" °  Device system confirmed to be MRI conditional, with implant date > 6 weeks ago, and no evidence of abandoned or epicardial leads in review of most recent CXR  Device last cleared by EP Provider: Daphne Barrack 12/27/2024  Clearance is good through for 1 year as long as parameters remain stable at time of check. If pt undergoes a cardiac device procedure during that time, they should be re-cleared.   Tachy-therapies to be programmed off if applicable with device back to pre-MRI settings after completion of exam.  [employer] was available remotely to assist in programming recommendations.   Rocky Catalan, RT  12/27/2024 2:08 PM      "

## 2024-12-30 ENCOUNTER — Ambulatory Visit: Payer: Medicare Other

## 2024-12-30 DIAGNOSIS — I4819 Other persistent atrial fibrillation: Secondary | ICD-10-CM | POA: Diagnosis not present

## 2024-12-31 ENCOUNTER — Encounter: Payer: Self-pay | Admitting: Cardiology

## 2024-12-31 ENCOUNTER — Ambulatory Visit: Attending: Cardiology | Admitting: Cardiology

## 2024-12-31 VITALS — BP 159/70 | HR 72 | Ht 65.25 in | Wt 154.0 lb

## 2024-12-31 DIAGNOSIS — I4819 Other persistent atrial fibrillation: Secondary | ICD-10-CM | POA: Diagnosis present

## 2024-12-31 DIAGNOSIS — Q2381 Bicuspid aortic valve: Secondary | ICD-10-CM | POA: Insufficient documentation

## 2024-12-31 DIAGNOSIS — Z8679 Personal history of other diseases of the circulatory system: Secondary | ICD-10-CM | POA: Diagnosis present

## 2024-12-31 DIAGNOSIS — I442 Atrioventricular block, complete: Secondary | ICD-10-CM | POA: Insufficient documentation

## 2024-12-31 DIAGNOSIS — I361 Nonrheumatic tricuspid (valve) insufficiency: Secondary | ICD-10-CM

## 2024-12-31 DIAGNOSIS — I83893 Varicose veins of bilateral lower extremities with other complications: Secondary | ICD-10-CM | POA: Insufficient documentation

## 2024-12-31 DIAGNOSIS — I10 Essential (primary) hypertension: Secondary | ICD-10-CM | POA: Diagnosis present

## 2024-12-31 DIAGNOSIS — Z9889 Other specified postprocedural states: Secondary | ICD-10-CM | POA: Diagnosis present

## 2024-12-31 DIAGNOSIS — I051 Rheumatic mitral insufficiency: Secondary | ICD-10-CM | POA: Diagnosis not present

## 2024-12-31 DIAGNOSIS — Z7901 Long term (current) use of anticoagulants: Secondary | ICD-10-CM | POA: Diagnosis present

## 2024-12-31 DIAGNOSIS — I071 Rheumatic tricuspid insufficiency: Secondary | ICD-10-CM | POA: Diagnosis present

## 2024-12-31 LAB — CUP PACEART REMOTE DEVICE CHECK
Battery Voltage: 80
Date Time Interrogation Session: 20260105091849
Implantable Lead Connection Status: 753985
Implantable Lead Connection Status: 753985
Implantable Lead Implant Date: 20230106
Implantable Lead Implant Date: 20230106
Implantable Lead Location: 753858
Implantable Lead Location: 753859
Implantable Lead Model: 377171
Implantable Lead Model: 377171
Implantable Lead Serial Number: 8000603602
Implantable Lead Serial Number: 8000650012
Implantable Pulse Generator Implant Date: 20230106
Pulse Gen Model: 407145
Pulse Gen Serial Number: 70291848

## 2024-12-31 NOTE — Patient Instructions (Addendum)
 Medication Instructions:   No changes *If you need a refill on your cardiac medications before your next appointment, please call your pharmacy*   Lab Work: Not needed    Testing/Procedures:  Schedule  in March 2027 1) Your physician has requested that you have an echocardiogram. Echocardiography is a painless test that uses sound waves to create images of your heart. It provides your doctor with information about the size and shape of your heart and how well your hearts chambers and valves are working. This procedure takes approximately one hour. There are no restrictions for this procedure. Please do NOT wear cologne, perfume, aftershave, or lotions (deodorant is allowed). Please arrive 15 minutes prior to your appointment time.  Please note: We ask at that you not bring children with you during ultrasound (echo/ vascular) testing. Due to room size and safety concerns, children are not allowed in the ultrasound rooms during exams. Our front office staff cannot provide observation of children in our lobby area while testing is being conducted. An adult accompanying a patient to their appointment will only be allowed in the ultrasound room at the discretion of the ultrasound technician under special circumstances. We apologize for any inconvenience.    Follow-Up: At [facility], you and your health needs are our priority.  As part of our continuing mission to provide you with exceptional heart care, we have created designated Provider Care Teams.  These Care Teams include your primary Cardiologist (physician) and Advanced Practice Providers (APPs -  Physician Assistants and Nurse Practitioners) who all work together to provide you with the care you need, when you need it.     Your next appointment:   14 month(s) March 2027  The format for your next appointment:   In Person  Provider:   Alm Clay, MD   Other Instructions

## 2024-12-31 NOTE — Progress Notes (Signed)
 " Cardiology Office Note:  .   Date:  01/03/2025  ID:  Adriana Hendricks, DOB 1934/12/31, MRN [id number] PCP: Katrinka Garnette KIDD, MD  [facility] Providers Cardiologist:  Alm Clay, MD Electrophysiologist:  OLE ONEIDA HOLTS, MD (Inactive)     Chief Complaint  Patient presents with   Follow-up    Prolonged follow-up over 1-1/2 years.  Otherwise doing pretty well.   Cardiac Valve Problem    History of tricuspid valve repair for severe TR now with moderate to severe TR   Atrial Fibrillation    History of surgical maze.  PPM in place on anticoagulation and beta-blocker.    Patient Profile: .     Adriana Hendricks is a very pleasant 89 y.o. female  with a PMH notable for CHB s/p PPM, s/p TV repair, and AF s/p MAZE  who presents here for delayed 2-year follow-up at the request of Katrinka Garnette KIDD, MD.  Notable PMH: PERSISTENT ATRIAL FIBRILLATION Remains on warfarin, despite LAA clip and MAZE. No record of recurrence, however sent 3-4 episodes of presumed tachycardia. SEVERE RHEUMATIC TR =>  s/p TVR/MAZE/LAA CLIP 05/2019 Follow-up Echos still show Moderate to Severe TR MODERATE MR FUNCTIONALLY BICUSPID AORTIC VALVE-mild AS COMPLETE HEART BLOCK -  January 2023:  Dual-Chamber PPM (Left Bundle Lead),   I last saw her in October 2023 and she was stable.  She was reluctant to proceed with any further evaluation or treatment options for her valvular disease because she was asymptomatic.     Adriana Hendricks was last seen on 10/23/2023 by Lamarr Satterfield, NP.  No major complaints.  She and her husband had moved into [place] [facility], and were doing it quite a bit because of the increased amount of social interaction.  The only major concern she had is that her bra straps get caught on her pacemaker but otherwise no major issues.  She noted chronic lower extremity varicose veins and was wearing compression hose.  Skin sensitive to touch but no significant edema.  She remained  relatively asymptomatic with her MR and TR.  Plan was for follow-up echocardiogram in 2025.  Her A-fib is essentially rate controlled and she has a PPM in place.  She remains on nadolol  20 [place] daily with as needed metoprolol  which she has not required.  Continued on warfarin with no major issues.  Her most recent follow-up with her with Jodie Passey, PA from EP on September 03, 2024.  She was doing well with no major issues.  No cardiac symptoms.  PPM interrogation showed normal function.  Plan was for 76-month follow-up.  Continue remote PPM follow-up.    Subjective  Discussed the use of AI scribe software for clinical note transcription with the patient, who gave verbal consent to proceed.  History of Present Illness Adriana Hendricks is an 89 year old female with mitral and aortic valve disease who presents for follow-up of her valve disease and pacemaker management.  She has stable INR levels and is scheduled for an appointment at the Coumadin  Clinic on Friday. She prefers the [employer] location for convenience.  She attributes her recent feelings of stress and cold symptoms to her elevated blood pressure readings at home. She monitors her blood pressure at home and has it checked regularly by a nurse. She takes low-dose blood pressure medication as needed, approximately once a year.  She is currently experiencing a cold and uses Geritussin, an [employer] version of Robitussin, to manage her symptoms.  No use of decongestants. She experiences epistaxis associated with her cold but no blood in stools or urine.  She has a history of BPPV vertigo and is scheduled for an MRI tomorrow due to ongoing ocular migraines and occasional 'keeling over' episodes. She is concerned about coughing during the MRI procedure.  She underwent mitral valve and aortic valve surgery in the past and has a pacemaker. No recent episodes of atrial fibrillation or irregular heartbeats. She takes warfarin 6 mg, nadolol  20 mg  daily, furosemide  as needed, and potassium 20 mg with lunch. She occasionally doubles her Lasix  dose if she has no commitments that require her to be away from a restroom.  She sleeps in a recliner due to her cold but usually uses a round pillow under her neck. No chest pain, pressure, or tightness, and no significant shortness of breath during daily activities. She mentions a recent incident of tilting over in the dark but did not lose consciousness.  Her last echocardiogram in April 2023 showed mild to moderate valve issues, but the plan was to check an echocardiogram in October 2025-not completed..     Objective   Medications: Nadolol  20 mg daily (rarely using Lopressor  25 mg PRN) Warfarin 2.5 mg daily per warfarin clinic Furosemide  20 mg daily along with Klor-Con  20 mill equivalents.  Has not taken additional doses of furosemide . Zanaflex  2 mg twice daily as needed spasm. Nightly Pepcid  20 mg Vitamin D  and calcium carbonate  Studies Reviewed: .        Results Diagnostic Echocardiogram (03/2022): LVEF 55-60 berry.  No RWMA.  Mild LVH.  Indeterminate diastolic parameters.  Mildly dilated LA.  Moderately elevated PAP.  Mildly dilated RA.  Mild MR.  Repaired TV with moderate to severe TR.  Functionally bicuspid AoV-moderate calcification/thickening with mild AS (mean AV gradient 12 mmHg; Echocardiogram (09/08/2021): EF 60-65%. Moderate MR. Moderate-severe TR.   Risk Assessment/Calculations:    CHA2DS2-VASc Score = 4   This indicates a 4.8% annual risk of stroke. The patient's score is based upon: CHF History: 0 HTN History: 1 Diabetes History: 0 Stroke History: 0 Vascular Disease History: 0 Age Score: 2 Gender Score: 1          Physical Exam:   VS:  BP (!) 159/70 (BP Location: Right Arm, Patient Position: Sitting, Cuff Size: Normal)   Pulse 72   Ht 5' 5.25 (1.657 m)   Wt 154 lb (69.9 kg)   LMP  (LMP Unknown)   SpO2 92%   BMI 25.43 kg/m    Repeat blood pressure was  146/68. Wt Readings from Last 3 Encounters:  12/31/24 154 lb (69.9 kg)  11/18/24 157 lb 6.4 oz (71.4 kg)  09/23/24 157 lb 12.8 oz (71.6 kg)     GEN: Well nourished, well groomed; in no acute distress; healthy-appearing. NECK: No JVD; No carotid bruits CARDIAC: RRR;, Normal S1, S2; no rubs or gallops.  Several murmurs noted including 2/6 SEM at LUSB, 2-3/6 HSM heard at the RLSB. RESPIRATORY:  Clear to auscultation without rales, wheezing or rhonchi ; nonlabored, good air movement. ABDOMEN: Soft, non-tender, non-distended EXTREMITIES:  No edema; No deformity      ASSESSMENT AND PLAN: .    Rheumatic tricuspid valve regurgitation  S/P tricuspid valve repair Assessment & Plan: Status post TR repair with minimal invasive surgery.  Now with moderate to severe recurrent TR.   S/P Maze operation for atrial fibrillation Assessment & Plan: Remains on warfarin because of persistent A-fib despite maze.  Also because of tricuspid ring.   Bicuspid aortic valve Assessment & Plan: Most recent echo showed mild AAS with the function bicuspid aortic valve. Follow-up echo in March 2027.   Rheumatic mitral regurgitation Assessment & Plan: Most recent echo showed mild MR.  Due for follow-up echocardiogram. Will schedule echo for March 2027-she wanted to wait and not to 1 this year.   Persistent atrial fibrillation [facility]) Assessment & Plan: Atrial fibrillation managed with pacemaker and anticoagulation. No breakthrough episodes. Pacemaker functioning well. Stable INR levels. - Continue current anticoagulation regimen with warfarin. - Well-controlled rate with beta-blocker: Continue nadolol  20 mg daily.  Has not required as needed beta-blocker for breakthrough. - Proceed with MRI as scheduled.   Essential hypertension Assessment & Plan: Elevated blood pressure likely due to stress and recent illness. No consistent hypertension history. Home monitoring in place. - Continue to monitor blood  pressure at home. - Report any consistently elevated readings.  For now we will simply continue nadolol  20 mg daily.  She would prefer to avoid additional medications, however if her blood pressures are elevated, would recommend ARB for afterload reduction.   Varicose veins of both legs with edema Assessment & Plan: Continue foot elevation and support stockings. Continue furosemide  with potassium supplementation.   Heart block AV complete (HCC) - s/p PPM Assessment & Plan: Status post pacemaker.  Followed by EP.  Functioning well.   Long term (current) use of anticoagulants: CHA2DS2Vasc 5, WARFARIN - Rheumatic Vavle Disease Assessment & Plan: CHA2DS2-VASc score at least 4.  On warfarin, followed by warfarin clinic.    No orders of the defined types were placed in this encounter.          Follow-Up: Return in about 14 months (around 02/28/2026) for 1 Yr Follow-up, To discuss test results.     Signed, Alm MICAEL Clay, MD, MS Alm Clay, M.D., M.S. Interventional Cardiologist  [facility] Pager # [phone]      "

## 2025-01-01 ENCOUNTER — Ambulatory Visit (HOSPITAL_COMMUNITY)
Admission: RE | Admit: 2025-01-01 | Discharge: 2025-01-01 | Disposition: A | Discharge status: Home / Self Care | Source: Ambulatory Visit | Attending: Family Medicine | Admitting: Family Medicine

## 2025-01-01 DIAGNOSIS — R42 Dizziness and giddiness: Secondary | ICD-10-CM | POA: Insufficient documentation

## 2025-01-01 DIAGNOSIS — R9089 Other abnormal findings on diagnostic imaging of central nervous system: Secondary | ICD-10-CM

## 2025-01-01 DIAGNOSIS — J328 Other chronic sinusitis: Secondary | ICD-10-CM | POA: Diagnosis not present

## 2025-01-01 NOTE — Progress Notes (Signed)
 Patient was monitored by this RN during MRI scan due to presence of a pacemaker. Cardiac rhythm was continuously monitored throughout the procedure. Prior to the start of the scan, the pacemaker was placed in MRI-safe mode by the MRI technician and pacemaker representative via phone. Following the completion of the scan, the device was returned to its pre-MRI settings. Neurological status and orientation post-procedure were unchanged from baseline.   Pre-procedure Heart Rate (Prior to being placed in MRI safe mode): 63 Intra-MRI HR: 80 Post-procedure Heart Rate (Once pacemaker is returned to baseline mode):  71

## 2025-01-02 NOTE — Progress Notes (Signed)
 Remote PPM Transmission

## 2025-01-03 ENCOUNTER — Encounter: Payer: Self-pay | Admitting: Cardiology

## 2025-01-03 ENCOUNTER — Ambulatory Visit (INDEPENDENT_AMBULATORY_CARE_PROVIDER_SITE_OTHER)

## 2025-01-03 DIAGNOSIS — Z7901 Long term (current) use of anticoagulants: Secondary | ICD-10-CM | POA: Diagnosis not present

## 2025-01-03 LAB — POCT INR: INR: 2.6 (ref 2.0–3.0)

## 2025-01-03 NOTE — Patient Instructions (Addendum)
 Pre visit review using our clinic review tool, if applicable. No additional management support is needed unless otherwise documented below in the visit note.  Continue 1 tablet daily except take 2 tablets on Wednesday. Recheck INR in 4 weeks. Phone number to the [place] anticoagulation clinic is [phone].

## 2025-01-03 NOTE — Assessment & Plan Note (Signed)
 Status post pacemaker.  Followed by EP.  Functioning well.

## 2025-01-03 NOTE — Assessment & Plan Note (Signed)
 Status post TR repair with minimal invasive surgery.  Now with moderate to severe recurrent TR.

## 2025-01-03 NOTE — Assessment & Plan Note (Signed)
 Elevated blood pressure likely due to stress and recent illness. No consistent hypertension history. Home monitoring in place. - Continue to monitor blood pressure at home. - Report any consistently elevated readings.  For now we will simply continue nadolol  20 mg daily.  She would prefer to avoid additional medications, however if her blood pressures are elevated, would recommend ARB for afterload reduction.

## 2025-01-03 NOTE — Assessment & Plan Note (Signed)
 Continue foot elevation and support stockings. Continue furosemide  with potassium supplementation.

## 2025-01-03 NOTE — Assessment & Plan Note (Signed)
 Remains on warfarin because of persistent A-fib despite maze.  Also because of tricuspid ring.

## 2025-01-03 NOTE — Assessment & Plan Note (Signed)
 Most recent echo showed mild AAS with the function bicuspid aortic valve. Follow-up echo in March 2027.

## 2025-01-03 NOTE — Assessment & Plan Note (Signed)
 Most recent echo showed mild MR.  Due for follow-up echocardiogram. Will schedule echo for March 2027-she wanted to wait and not to 1 this year.

## 2025-01-03 NOTE — Progress Notes (Signed)
 Indication: persistent Afib Continue 1 tablet daily except take 2 tablets on Wednesday. Recheck INR in 4 weeks. Phone number to the [place] anticoagulation clinic is [phone].

## 2025-01-03 NOTE — Assessment & Plan Note (Signed)
 Atrial fibrillation managed with pacemaker and anticoagulation. No breakthrough episodes. Pacemaker functioning well. Stable INR levels. - Continue current anticoagulation regimen with warfarin. - Well-controlled rate with beta-blocker: Continue nadolol  20 mg daily.  Has not required as needed beta-blocker for breakthrough. - Proceed with MRI as scheduled.

## 2025-01-03 NOTE — Assessment & Plan Note (Signed)
 Moderate to severe TR of previously repaired tricuspid valve.  Not much more to work with surgically. Stable symptoms and valve function warrant surveillance.  We discussed that she would be due get an echocardiogram checked this year, but she would prefer to wait longer since she is feeling well. - Schedule echocardiogram for March 2027. - Follow up after echocardiogram in April 2027.

## 2025-01-03 NOTE — Assessment & Plan Note (Signed)
 CHA2DS2-VASc score at least 4.  On warfarin, followed by warfarin clinic.

## 2025-01-04 ENCOUNTER — Ambulatory Visit: Payer: Self-pay | Admitting: Cardiology

## 2025-01-09 NOTE — Progress Notes (Signed)
 Last read by [name] at 8:12AM on 01/09/2025.

## 2025-01-14 ENCOUNTER — Other Ambulatory Visit: Payer: Self-pay | Admitting: Cardiology

## 2025-01-16 ENCOUNTER — Telehealth: Payer: Self-pay | Admitting: Cardiology

## 2025-01-16 NOTE — Telephone Encounter (Signed)
" °*  STAT* If patient is at the pharmacy, call can be transferred to refill team.   1. Which medications need to be refilled? (please list name of each medication and dose if known)   nadolol  (CORGARD ) 20 MG tablet   2. Would you like to learn more about the convenience, safety, & potential cost savings by using the [facility]?   3. Are you open to using the [facility] (Type [facility]. ).  4. Which pharmacy/location (including street and city if local pharmacy) is medication to be sent to?  [facility] - [place], [place] - [address]   5. Do they need a 30 day or 90 day supply?   90 day  Caller Eldon) stated patient wants to get a prescription for 90 days and is almost out of this medication.  Caller noted their pharmacy closes today at 1:30 pm.  "

## 2025-01-27 NOTE — Addendum Note (Signed)
 Encounter addended by: Thayne Consuelo DEL on: 01/27/2025 11:18 AM  Actions taken: Imaging Exam ended

## 2025-01-31 ENCOUNTER — Ambulatory Visit

## 2025-01-31 DIAGNOSIS — Z7901 Long term (current) use of anticoagulants: Secondary | ICD-10-CM

## 2025-01-31 LAB — POCT INR: INR: 3.1 — AB (ref 2.0–3.0)

## 2025-01-31 NOTE — Patient Instructions (Addendum)
 Pre visit review using our clinic review tool, if applicable. No additional management support is needed unless otherwise documented below in the visit note.  Eat a salad today and then continue 1 tablet daily except take 2 tablets on Wednesday. Recheck INR in 4 weeks. Phone number to the [place] anticoagulation clinic is [phone].

## 2025-01-31 NOTE — Progress Notes (Signed)
 Indication: persistent Afib Eat a salad today and then continue 1 tablet daily except take 2 tablets on Wednesday. Recheck INR in 4 weeks. Phone number to the [place] anticoagulation clinic is [phone].

## 2025-02-28 ENCOUNTER — Ambulatory Visit

## 2025-03-31 ENCOUNTER — Encounter

## 2025-05-20 ENCOUNTER — Ambulatory Visit: Admitting: Family Medicine

## 2025-06-30 ENCOUNTER — Encounter

## 2025-09-29 ENCOUNTER — Encounter

## 2025-12-29 ENCOUNTER — Encounter

## 2026-03-30 ENCOUNTER — Encounter

## 2026-06-29 ENCOUNTER — Encounter
